# Patient Record
Sex: Male | Born: 1935
Health system: Southern US, Community
[De-identification: ages and names within clinical notes are randomized; demographics above are authoritative.]

## PROBLEM LIST (undated history)

## (undated) DIAGNOSIS — M069 Rheumatoid arthritis, unspecified: Secondary | ICD-10-CM

## (undated) DIAGNOSIS — E785 Hyperlipidemia, unspecified: Secondary | ICD-10-CM

## (undated) DIAGNOSIS — M1990 Unspecified osteoarthritis, unspecified site: Secondary | ICD-10-CM

## (undated) DIAGNOSIS — Z9289 Personal history of other medical treatment: Secondary | ICD-10-CM

## (undated) DIAGNOSIS — I10 Essential (primary) hypertension: Secondary | ICD-10-CM

## (undated) DIAGNOSIS — G4730 Sleep apnea, unspecified: Secondary | ICD-10-CM

## (undated) DIAGNOSIS — K219 Gastro-esophageal reflux disease without esophagitis: Secondary | ICD-10-CM

## (undated) DIAGNOSIS — F329 Major depressive disorder, single episode, unspecified: Secondary | ICD-10-CM

## (undated) DIAGNOSIS — G4733 Obstructive sleep apnea (adult) (pediatric): Secondary | ICD-10-CM

## (undated) DIAGNOSIS — N312 Flaccid neuropathic bladder, not elsewhere classified: Secondary | ICD-10-CM

## (undated) DIAGNOSIS — J449 Chronic obstructive pulmonary disease, unspecified: Secondary | ICD-10-CM

## (undated) HISTORY — DX: Rheumatoid arthritis, unspecified: M06.9

## (undated) HISTORY — PX: NISSEN FUNDOPLICATION: SHX2091

## (undated) HISTORY — DX: Chronic obstructive pulmonary disease, unspecified: J44.9

## (undated) HISTORY — DX: Unspecified osteoarthritis, unspecified site: M19.90

## (undated) HISTORY — DX: Obstructive sleep apnea (adult) (pediatric): G47.33

## (undated) HISTORY — DX: Gastro-esophageal reflux disease without esophagitis: K21.9

## (undated) HISTORY — DX: Major depressive disorder, single episode, unspecified: F32.9

## (undated) HISTORY — DX: Essential (primary) hypertension: I10

## (undated) HISTORY — DX: Flaccid neuropathic bladder, not elsewhere classified: N31.2

## (undated) HISTORY — DX: Hyperlipidemia, unspecified: E78.5

---
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
## 1967-01-22 HISTORY — PX: APPENDECTOMY: SHX54

## 1970-01-21 HISTORY — PX: VASECTOMY: SHX75

## 1988-01-22 HISTORY — PX: HERNIA REPAIR: SHX51

## 1997-09-08 ENCOUNTER — Other Ambulatory Visit: Admission: RE | Admit: 1997-09-08 | Discharge: 1997-09-08 | Payer: Self-pay | Admitting: Gastroenterology

## 1998-12-23 ENCOUNTER — Encounter (INDEPENDENT_AMBULATORY_CARE_PROVIDER_SITE_OTHER): Payer: Self-pay | Admitting: *Deleted

## 1998-12-23 ENCOUNTER — Inpatient Hospital Stay (HOSPITAL_COMMUNITY): Admission: EM | Admit: 1998-12-23 | Discharge: 1998-12-27 | Payer: Self-pay | Admitting: *Deleted

## 1998-12-27 ENCOUNTER — Encounter: Payer: Self-pay | Admitting: Gastroenterology

## 2001-02-27 ENCOUNTER — Ambulatory Visit (HOSPITAL_BASED_OUTPATIENT_CLINIC_OR_DEPARTMENT_OTHER): Admission: RE | Admit: 2001-02-27 | Discharge: 2001-02-27 | Payer: Self-pay | Admitting: Orthopedic Surgery

## 2001-11-20 ENCOUNTER — Encounter: Payer: Self-pay | Admitting: Urology

## 2001-11-20 ENCOUNTER — Encounter: Admission: RE | Admit: 2001-11-20 | Discharge: 2001-11-20 | Payer: Self-pay | Admitting: Urology

## 2001-12-15 ENCOUNTER — Encounter: Payer: Self-pay | Admitting: Gastroenterology

## 2001-12-15 ENCOUNTER — Encounter: Admission: RE | Admit: 2001-12-15 | Discharge: 2001-12-15 | Payer: Self-pay | Admitting: Gastroenterology

## 2002-01-21 HISTORY — PX: SIGMOIDECTOMY: SHX176

## 2002-01-29 ENCOUNTER — Inpatient Hospital Stay (HOSPITAL_COMMUNITY): Admission: RE | Admit: 2002-01-29 | Discharge: 2002-02-06 | Payer: Self-pay | Admitting: General Surgery

## 2002-01-29 ENCOUNTER — Encounter (INDEPENDENT_AMBULATORY_CARE_PROVIDER_SITE_OTHER): Payer: Self-pay | Admitting: Specialist

## 2003-02-01 ENCOUNTER — Emergency Department (HOSPITAL_COMMUNITY): Admission: EM | Admit: 2003-02-01 | Discharge: 2003-02-02 | Payer: Self-pay | Admitting: Emergency Medicine

## 2005-10-29 ENCOUNTER — Ambulatory Visit: Payer: Self-pay | Admitting: Gastroenterology

## 2005-12-06 ENCOUNTER — Ambulatory Visit: Payer: Self-pay | Admitting: Gastroenterology

## 2005-12-06 ENCOUNTER — Encounter: Payer: Self-pay | Admitting: Gastroenterology

## 2006-07-06 ENCOUNTER — Emergency Department (HOSPITAL_COMMUNITY): Admission: EM | Admit: 2006-07-06 | Discharge: 2006-07-06 | Payer: Self-pay | Admitting: Emergency Medicine

## 2008-04-05 ENCOUNTER — Encounter: Admission: RE | Admit: 2008-04-05 | Discharge: 2008-04-05 | Payer: Self-pay | Admitting: Orthopedic Surgery

## 2008-04-07 ENCOUNTER — Ambulatory Visit (HOSPITAL_BASED_OUTPATIENT_CLINIC_OR_DEPARTMENT_OTHER): Admission: RE | Admit: 2008-04-07 | Discharge: 2008-04-08 | Payer: Self-pay | Admitting: Orthopedic Surgery

## 2008-04-07 ENCOUNTER — Encounter (INDEPENDENT_AMBULATORY_CARE_PROVIDER_SITE_OTHER): Payer: Self-pay | Admitting: Orthopedic Surgery

## 2009-01-11 ENCOUNTER — Ambulatory Visit: Payer: Self-pay | Admitting: Pulmonary Disease

## 2009-01-11 DIAGNOSIS — M1990 Unspecified osteoarthritis, unspecified site: Secondary | ICD-10-CM | POA: Insufficient documentation

## 2009-01-11 DIAGNOSIS — G4733 Obstructive sleep apnea (adult) (pediatric): Secondary | ICD-10-CM | POA: Insufficient documentation

## 2009-01-11 DIAGNOSIS — E785 Hyperlipidemia, unspecified: Secondary | ICD-10-CM | POA: Insufficient documentation

## 2009-01-11 DIAGNOSIS — E119 Type 2 diabetes mellitus without complications: Secondary | ICD-10-CM | POA: Insufficient documentation

## 2009-01-11 DIAGNOSIS — M069 Rheumatoid arthritis, unspecified: Secondary | ICD-10-CM | POA: Insufficient documentation

## 2009-02-14 ENCOUNTER — Encounter: Payer: Self-pay | Admitting: Pulmonary Disease

## 2009-02-14 ENCOUNTER — Ambulatory Visit (HOSPITAL_BASED_OUTPATIENT_CLINIC_OR_DEPARTMENT_OTHER): Admission: RE | Admit: 2009-02-14 | Discharge: 2009-02-14 | Payer: Self-pay | Admitting: Pulmonary Disease

## 2009-03-01 ENCOUNTER — Ambulatory Visit: Payer: Self-pay | Admitting: Pulmonary Disease

## 2009-03-01 ENCOUNTER — Telehealth (INDEPENDENT_AMBULATORY_CARE_PROVIDER_SITE_OTHER): Payer: Self-pay | Admitting: *Deleted

## 2009-03-02 ENCOUNTER — Ambulatory Visit: Payer: Self-pay | Admitting: Pulmonary Disease

## 2009-03-08 ENCOUNTER — Telehealth (INDEPENDENT_AMBULATORY_CARE_PROVIDER_SITE_OTHER): Payer: Self-pay | Admitting: *Deleted

## 2009-04-20 ENCOUNTER — Ambulatory Visit: Payer: Self-pay | Admitting: Pulmonary Disease

## 2009-04-24 ENCOUNTER — Encounter: Payer: Self-pay | Admitting: Pulmonary Disease

## 2009-05-18 ENCOUNTER — Encounter: Payer: Self-pay | Admitting: Pulmonary Disease

## 2009-05-31 ENCOUNTER — Ambulatory Visit: Payer: Self-pay | Admitting: Pulmonary Disease

## 2009-06-13 ENCOUNTER — Telehealth (INDEPENDENT_AMBULATORY_CARE_PROVIDER_SITE_OTHER): Payer: Self-pay | Admitting: *Deleted

## 2009-06-21 ENCOUNTER — Encounter: Payer: Self-pay | Admitting: Pulmonary Disease

## 2009-07-26 ENCOUNTER — Ambulatory Visit: Payer: Self-pay | Admitting: Pulmonary Disease

## 2009-11-27 ENCOUNTER — Ambulatory Visit: Payer: Self-pay | Admitting: Pulmonary Disease

## 2010-02-20 NOTE — Progress Notes (Signed)
Summary: c pap provider  Phone Note Call from Patient   Caller: Patient Call For: clance Summary of Call: pt need another provider for c pap machine Initial call taken by: Rickard Patience,  March 08, 2009 2:03 PM  Follow-up for Phone Call        spoke to [email or web address]  nd pt's ins is pending and he will be called an notified of this  Follow-up by: Oneita Jolly,  March 08, 2009 2:34 PM

## 2010-02-20 NOTE — Letter (Signed)
Summary: CMN  CMN   Imported By: Lehman Prom 05/18/2009 16:57:48  _____________________________________________________________________  External Attachment:    Type:   Image     Comment:   External Document

## 2010-02-20 NOTE — Letter (Signed)
Summary: External Correspondence  External Correspondence   Imported By: Valinda Hoar 04/24/2009 15:41:25  _____________________________________________________________________  External Attachment:    Type:   Image     Comment:   External Document

## 2010-02-20 NOTE — Assessment & Plan Note (Signed)
Summary: ov to discuss sleep study results/mg   Copy to:  Self- Referral Primary Provider/Referring Provider:  Nolon Nations  CC:  Pt is here for a f/u appt to discuss sleep study results. .  History of Present Illness: the pt comes in today for f/u of his recent sleep study.  He was found to have severe osa, with AHI of 48/hr and desat as low as 82%.  He also had spontaneous desats requiring one liter of oxygen by sleep center protocol.  I have gone over the study in detail with him, and answered all of his questions.  Medications Prior to Update: 1)  Lipitor 40 Mg Tabs (Atorvastatin Calcium) .... Take 1 Tablet By Mouth Once A Day 2)  Paroxetine Hcl 20 Mg Tabs (Paroxetine Hcl) .... Take 1 Tablet By Mouth Once A Day 3)  Misoprostol 100 Mcg Tabs (Misoprostol) .... Take 1 Tablet By Mouth Two Times A Day 4)  Centrum  Tabs (Multiple Vitamins-Minerals) .... Take 1 Tablet By Mouth Once A Day 5)  Aspirin Low Dose 81 Mg Tabs (Aspirin) .... Take 1 Tablet By Mouth Once A Day 6)  Vitamin D 1000 Unit Tabs (Cholecalciferol) .... Take 1 Tablet By Mouth Two Times A Day 7)  Celebrex 200 Mg Caps (Celecoxib) .... Take 1 Tablet By Mouth Once A Day 8)  Metformin Hcl 750 Mg Xr24h-Tab (Metformin Hcl) .... Take 1 Tablet By Mouth Once A Day  Allergies (verified): No Known Drug Allergies  Vital Signs:  Patient profile:   77 year old male Height:      72 inches Weight:      267 pounds O2 Sat:      93 % on Room air Temp:     97.5 degrees F oral Pulse rate:   107 / minute BP sitting:   122 / 78  (left arm) Cuff size:   regular  Vitals Entered By: Arman Filter LPN (March 02, 2009 2:29 PM)  O2 Flow:  Room air CC: Pt is here for a f/u appt to discuss sleep study results.  Comments Medications reviewed with patient Arman Filter LPN  March 02, 2009 2:29 PM    Physical Exam  General:  obese male in nad   Impression & Recommendations:  Problem # 1:  OBSTRUCTIVE SLEEP APNEA (ICD-327.23) the  pt has severe obstructive sleep apnea by his sleep study, and will need to be started on cpap while working on weight loss.  Will start the pt on a moderate pressure level to allow desensitization, then will work on optimization with auto mode thereafter.  I have gone over the various types of masks with the patient, and have discussed the use of heated humidification.  He will followup with me in 4 weeks, and call if having tolerance issues.  Time spent with pt today was .  Other Orders: Est. Patient Level III (32992) DME Referral (DME)  Patient Instructions: 1)  will start on cpap 2)  work on weight loss 3)  followup with me in 4 weeks.

## 2010-02-20 NOTE — Assessment & Plan Note (Signed)
Summary: rov for osa   Visit Type:  Follow-up Primary Provider/Referring Provider:  Nolon Nations  CC:  6 month follow pt states he uses cpap eveyrnight x 10-11 hrs a night. Pt states he is having no problems today. Marland Kitchen  History of Present Illness: the pt comes in today for f/u of his known osa.  He is wearing cpap compliantly, and reports no issues with his cpap mask or pressure.  He feels that he is sleeping well, and is satisfied with his daytime alertness.  Current Medications (verified): 1)  Lipitor 40 Mg Tabs (Atorvastatin Calcium) .... Take 1 Tablet By Mouth Once A Day 2)  Paroxetine Hcl 20 Mg Tabs (Paroxetine Hcl) .... Take 1 Tablet By Mouth Once A Day 3)  Misoprostol 100 Mcg Tabs (Misoprostol) .... Take 1 Tablet By Mouth Two Times A Day 4)  Centrum  Tabs (Multiple Vitamins-Minerals) .... Take 1 Tablet By Mouth Once A Day 5)  Aspirin Low Dose 81 Mg Tabs (Aspirin) .... Take 1 Tablet By Mouth Once A Day 6)  Vitamin D 1000 Unit Tabs (Cholecalciferol) .... Take 2 Tabs By Mouth Two Times A Day 7)  Celebrex 200 Mg Caps (Celecoxib) .... Take 1 Tablet By Mouth Once A Day 8)  Metformin Hcl 750 Mg Xr24h-Tab (Metformin Hcl) .... Take 1 Tablet By Mouth Once A Day 9)  Cpap 11 .... At Bedtime  Allergies (verified): No Known Drug Allergies  Review of Systems       The patient complains of shortness of breath with activity, non-productive cough, and nasal congestion/difficulty breathing through nose.  The patient denies shortness of breath at rest, productive cough, coughing up blood, chest pain, irregular heartbeats, acid heartburn, indigestion, loss of appetite, weight change, abdominal pain, difficulty swallowing, sore throat, tooth/dental problems, headaches, sneezing, itching, ear ache, anxiety, depression, hand/feet swelling, joint stiffness or pain, rash, change in color of mucus, and fever.    Vital Signs:  Patient profile:   77 year old male Height:      72 inches Weight:      264.50  pounds BMI:     36.00 O2 Sat:      92 % on Room air Temp:     97.3 degrees F oral Pulse rate:   104 / minute BP sitting:   124 / 76  (left arm) Cuff size:   large  Vitals Entered By: Carver Fila (November 27, 2009 2:21 PM)  O2 Flow:  Room air CC: 6 month follow pt states he uses cpap eveyrnight x 10-11 hrs a night. Pt states he is having no problems today.  Comments meds and allergies updated Phone number updated Carver Fila  November 27, 2009 2:21 PM    Physical Exam  General:  obese male in nad Nose:  no skin breakdown or pressure necrosis from cpap mask Extremities:  no edema or cyanosis  Neurologic:  alert and oriented, moves all 4. does not appear sleepy.   Impression & Recommendations:  Problem # 1:  OBSTRUCTIVE SLEEP APNEA (ICD-327.23) the pt is doing well with cpap, and feels that he has adapted.  He denies any machine or mask issues, and feels his alertness during the day is satisfactory.  I have encouraged him to work on weight loss, and will see him back in one year.  I have also asked him to keep up with the mask changes and supplies for his machine.  Other Orders: Est. Patient Level III (16109)  Patient Instructions: 1)  followup with me in one year. 2)  work on losing weight    Immunization History:  Influenza Immunization History:    Influenza:  historical (11/22/2009)

## 2010-02-20 NOTE — Progress Notes (Signed)
----   Converted from flag ---- ---- 03/01/2009 12:20 PM, Arman Filter LPN wrote: pt needs ov with kc to discuss sleep study results. ------------------------------  called and spoke with pt.  pt scheduled to see kc tomorrow 03-02-09 at 2:30pm

## 2010-02-20 NOTE — Assessment & Plan Note (Signed)
Summary: rov for osa   Copy to:  Self- Referral Primary Provider/Referring Provider:  Nolon Nations  CC:  Pt is here for a f/u appt to discuss recent auto download.  Pt states he is wearing his cpap machine every night.  Approx 10 hours per night.  Pt states he dislikes current mask and would like a different one.  Marland Kitchen  History of Present Illness: The pt comes in today for f/u of his osa.  He has been on auto setting for 2 weeks, and has excellent compliance as documented by download.  His optimal pressure is 11cm.  His only complaint is issue with mask fitting, and from his description, sounds like it is too small.  He feels that it is helping his sleep and daytime alertness.  Current Medications (verified): 1)  Lipitor 40 Mg Tabs (Atorvastatin Calcium) .... Take 1 Tablet By Mouth Once A Day 2)  Paroxetine Hcl 20 Mg Tabs (Paroxetine Hcl) .... Take 1 Tablet By Mouth Once A Day 3)  Misoprostol 100 Mcg Tabs (Misoprostol) .... Take 1 Tablet By Mouth Two Times A Day 4)  Centrum  Tabs (Multiple Vitamins-Minerals) .... Take 1 Tablet By Mouth Once A Day 5)  Aspirin Low Dose 81 Mg Tabs (Aspirin) .... Take 1 Tablet By Mouth Once A Day 6)  Vitamin D 1000 Unit Tabs (Cholecalciferol) .... Take 2 Tabs By Mouth Two Times A Day 7)  Celebrex 200 Mg Caps (Celecoxib) .... Take 1 Tablet By Mouth Once A Day 8)  Metformin Hcl 750 Mg Xr24h-Tab (Metformin Hcl) .... Take 1 Tablet By Mouth Once A Day 9)  Cpap 5 .... At Bedtime  Allergies (verified): No Known Drug Allergies  Review of Systems      See HPI  Vital Signs:  Patient profile:   77 year old male Height:      72 inches Weight:      266 pounds BMI:     36.21 O2 Sat:      97 % on Room air Temp:     97.9 degrees F oral Pulse rate:   116 / minute BP sitting:   126 / 72  (left arm) Cuff size:   large  Vitals Entered By: Arman Filter LPN (May 31, 2009 2:36 PM)  O2 Flow:  Room air CC: Pt is here for a f/u appt to discuss recent auto download.  Pt  states he is wearing his cpap machine every night.  Approx 10 hours per night.  Pt states he dislikes current mask and would like a different one.   Comments Medications reviewed with patient Arman Filter LPN  May 31, 2009 2:36 PM    Physical Exam  General:  obese male in nad Nose:  no skin breakdown or pressure necrosis from cpap mask Neurologic:  alert and not sleepy, moves all 4.   Impression & Recommendations:  Problem # 1:  OBSTRUCTIVE SLEEP APNEA (ICD-327.23) the pt is adapting to his cpap except for his mask fit.  This needs to be worked on.  He has been optimized to a pressure of 11cm, and I have asked him to work aggressively on weight loss.  He is to let me know if he continues to have mask fit issues.  Medications Added to Medication List This Visit: 1)  Vitamin D 1000 Unit Tabs (Cholecalciferol) .... Take 2 tabs by mouth two times a day  Other Orders: Est. Patient Level III (47829) DME Referral (DME)  Patient Instructions: 1)  will  get your mask fitted again 2)  will have your heated humidifier checked 3)  will increase cpap pressure to 11. 4)  work on weight loss 5)  followup with me in 6mos if doing well.  If not, call me.   Immunization History:  Influenza Immunization History:    Influenza:  historical (01/22/2008)  Pneumovax Immunization History:    Pneumovax:  historical (01/22/2007)

## 2010-02-20 NOTE — Assessment & Plan Note (Signed)
Summary: rov for osa   Copy to:  Self- Referral Primary Provider/Referring Provider:  Nolon Nations  CC:  6 wk followup.  Pt states that he has been using CPAP x 1 month now.  Pt states that he has been averaging about 10-11 hrs of sleep at bedtime.  Pt denies any complaints regarding CPAP machine or mask.  He states that he is unsure if he feels better rested.Marland Kitchen  History of Present Illness: The pt comes in today for f/u of his osa.  He has been started on cpap, and has been compliant with therapy.  He reports no issues with mask fit or pressure, and feels that he does sleep better with the device.  He has not seen a significant difference in daytime alertness.  Current Medications (verified): 1)  Lipitor 40 Mg Tabs (Atorvastatin Calcium) .... Take 1 Tablet By Mouth Once A Day 2)  Paroxetine Hcl 20 Mg Tabs (Paroxetine Hcl) .... Take 1 Tablet By Mouth Once A Day 3)  Misoprostol 100 Mcg Tabs (Misoprostol) .... Take 1 Tablet By Mouth Two Times A Day 4)  Centrum  Tabs (Multiple Vitamins-Minerals) .... Take 1 Tablet By Mouth Once A Day 5)  Aspirin Low Dose 81 Mg Tabs (Aspirin) .... Take 1 Tablet By Mouth Once A Day 6)  Vitamin D 1000 Unit Tabs (Cholecalciferol) .... Take 1 Tablet By Mouth Two Times A Day 7)  Celebrex 200 Mg Caps (Celecoxib) .... Take 1 Tablet By Mouth Once A Day 8)  Metformin Hcl 750 Mg Xr24h-Tab (Metformin Hcl) .... Take 1 Tablet By Mouth Once A Day 9)  Cpap 5 .... At Bedtime  Allergies (verified): No Known Drug Allergies  Review of Systems      See HPI  Vital Signs:  Patient profile:   77 year old male Weight:      268.25 pounds O2 Sat:      92 % on Room air Temp:     97.5 degrees F oral Pulse rate:   113 / minute BP sitting:   118 / 80  (left arm)  Vitals Entered By: Vernie Murders (April 20, 2009 2:02 PM)  O2 Flow:  Room air  Physical Exam  General:  obese male in nad   Impression & Recommendations:  Problem # 1:  OBSTRUCTIVE SLEEP APNEA  (ICD-327.23)  the pt has adapted well to cpap, and now will require optimization of his pressure.  I have explained that he should see improvement in his daytime symptoms once he has been on an optimal pressure for a period of time.  Will turn his machine to the auto mode for the next 2 weeks, and establish his optimal pressure off the download.  I have also encouraged him to work aggressively on weight loss.  Medications Added to Medication List This Visit: 1)  Cpap 5  .... At bedtime  Other Orders: Est. Patient Level II (91478) DME Referral (DME)  Patient Instructions: 1)  will optimize pressure on automatic mode for the next 2 weeks.  I will call you with the results. 2)  work on weight loss 3)  will see you back in 6mos, but call if having issues with cpap.

## 2010-02-20 NOTE — Progress Notes (Signed)
Summary: cpap out  Phone Note Call from Patient Call back at [facility] Phone [phone]   Caller: Patient Call For: Clance Reason for Call: Talk to Nurse Summary of Call: cpap machine not working correctly - effective immediatly, pt is going to stop using because of severe aggrivation to pt's left eye.  14 days ago as result of visit you were to contact [employer].  As of 4:00 pm today 06/13/2009, pt has not heard from them.  Pt request cancellation of service from this company and set up with another DME.  [employer] has been rotten!!  Pt feels his health is in jeopardy, please help.  Showing you ordered on 05/31/2009 and [facility] sent order to [facility] on the same date. Initial call taken by: Eugene Gavia,  Jun 13, 2009 4:01 PM  Follow-up for Phone Call        Almyra Free or Bjorn Loser, please help with switching this pt's DME co b/c he is unhappy he still has not heard from [facility] yet. Follow-up by: Vernie Murders,  Jun 13, 2009 4:12 PM  Additional Follow-up for Phone Call Additional follow up Details #1::        spoke to pt and advised he would be switched from [facility] to [facility] for his cpap equiptment  Additional Follow-up by: Oneita Jolly,  Jun 13, 2009 4:31 PM

## 2010-02-20 NOTE — Assessment & Plan Note (Signed)
Summary: rov for osa   Copy to:  Self- Referral Primary Provider/Referring Provider:  Nolon Nations  CC:  Pt states that he got a new CPAP approx 1 month ago.  He states that it is working fine and has no problems.  He states that he was advised by [facility] to call and sched appt here for followup.  Marland Kitchen  History of Present Illness: the pt comes in today for f/u of his osa...he changed dme's and hence has to have a 4 week f/u with me.  He is doing very well with cpap, and has no complaints.  Current Medications (verified): 1)  Lipitor 40 Mg Tabs (Atorvastatin Calcium) .... Take 1 Tablet By Mouth Once A Day 2)  Paroxetine Hcl 20 Mg Tabs (Paroxetine Hcl) .... Take 1 Tablet By Mouth Once A Day 3)  Misoprostol 100 Mcg Tabs (Misoprostol) .... Take 1 Tablet By Mouth Two Times A Day 4)  Centrum  Tabs (Multiple Vitamins-Minerals) .... Take 1 Tablet By Mouth Once A Day 5)  Aspirin Low Dose 81 Mg Tabs (Aspirin) .... Take 1 Tablet By Mouth Once A Day 6)  Vitamin D 1000 Unit Tabs (Cholecalciferol) .... Take 2 Tabs By Mouth Two Times A Day 7)  Celebrex 200 Mg Caps (Celecoxib) .... Take 1 Tablet By Mouth Once A Day 8)  Metformin Hcl 750 Mg Xr24h-Tab (Metformin Hcl) .... Take 1 Tablet By Mouth Once A Day 9)  Cpap 11 .... At Bedtime  Allergies (verified): No Known Drug Allergies  Review of Systems  The patient denies shortness of breath with activity, shortness of breath at rest, productive cough, non-productive cough, coughing up blood, chest pain, irregular heartbeats, acid heartburn, indigestion, loss of appetite, weight change, abdominal pain, difficulty swallowing, sore throat, tooth/dental problems, headaches, nasal congestion/difficulty breathing through nose, sneezing, itching, ear ache, anxiety, depression, hand/feet swelling, joint stiffness or pain, rash, change in color of mucus, and fever.    Vital Signs:  Patient profile:   77 year old male Weight:      263 pounds O2 Sat:      92 % on Room  air Temp:     97.9 degrees F oral Pulse rate:   106 / minute BP sitting:   120 / 72  (left arm)  Vitals Entered By: Vernie Murders (July 26, 2009 1:31 PM)  O2 Flow:  Room air  Physical Exam  General:  ow male in nad Nose:  no skin breakdown or pressure necrosis from cpap mask   Impression & Recommendations:  Problem # 1:  OBSTRUCTIVE SLEEP APNEA (ICD-327.23)  the pt is wearing cpap compliantly, and feels it is working well for him.  He denies any mask or pressure issues.  I have asked him to continue with cpap, and have encouraged him to work on weight loss.  Orders: No Charge Patient Arrived (NCPA0) (NCPA0)  Medications Added to Medication List This Visit: 1)  Cpap 11  .... At bedtime  Patient Instructions: 1)  continue on cpap, and work on weight loss 2)  followup with me in 5mos

## 2010-02-20 NOTE — Letter (Signed)
Summary: CMN/HCS  CMN/HCS   Imported By: Lester Darke 06/27/2009 11:56:10  _____________________________________________________________________  External Attachment:    Type:   Image     Comment:   External Document

## 2010-05-03 LAB — COMPREHENSIVE METABOLIC PANEL WITH GFR
ALT: 31 U/L (ref 0–53)
AST: 30 U/L (ref 0–37)
Albumin: 3.5 g/dL (ref 3.5–5.2)
Alkaline Phosphatase: 76 U/L (ref 39–117)
BUN: 14 mg/dL (ref 6–23)
CO2: 28 meq/L (ref 19–32)
Calcium: 10 mg/dL (ref 8.4–10.5)
Chloride: 105 meq/L (ref 96–112)
Creatinine, Ser: 0.96 mg/dL (ref 0.4–1.5)
GFR calc non Af Amer: >60 mL/min
Glucose, Bld: 97 mg/dL (ref 70–99)
Potassium: 4.9 meq/L (ref 3.5–5.1)
Sodium: 139 meq/L (ref 135–145)
Total Bilirubin: 0.9 mg/dL (ref 0.3–1.2)
Total Protein: 6.4 g/dL (ref 6.0–8.3)

## 2010-05-03 LAB — GLUCOSE, CAPILLARY
Glucose-Capillary: 87 mg/dL (ref 70–99)
Glucose-Capillary: 96 mg/dL (ref 70–99)

## 2010-05-03 LAB — HEMOGLOBIN A1C
Hgb A1c MFr Bld: 6.8 % — ABNORMAL HIGH (ref 4.6–6.1)
Mean Plasma Glucose: 148 mg/dL

## 2010-05-03 LAB — LIPID PANEL: Cholesterol: 141 mg/dL (ref 0–200)

## 2010-06-05 NOTE — Op Note (Signed)
NAME:  Lance Powell, Lance Powell NO.:  [id number]   MEDICAL RECORD NO.:  [id number]          PATIENT TYPE:  AMB   LOCATION:  [facility]                          FACILITY:  [facility]   PHYSICIAN:  Loreta Ave, M.D. DATE OF BIRTH:  1930/01/17   DATE OF PROCEDURE:  04/07/2008  DATE OF DISCHARGE:                               OPERATIVE REPORT   PREOPERATIVE DIAGNOSIS:  Chronic draining septic olecranon bursitis,  left elbow.   POSTOPERATIVE DIAGNOSIS:  Chronic draining septic olecranon bursitis,  left elbow at this point without obvious purulence due to previous  treatment.   PROCEDURES:  Complete excision of olecranon bursa and draining sinus  tract with primary closure.   SURGEON:  Loreta Ave, MD   ASSISTANT:  Genene Churn. Barry Dienes, PA   ANESTHESIA:  General.   ESTIMATED BLOOD LOSS:  Minimal.   TOURNIQUET TIME:  45 minutes.   SPECIMENS:  Excised bursa.   CULTURES:  None.   COMPLICATIONS:  None.   DRESSING:  Bulky soft compressive.   PROCEDURE:  The patient brought to the operating room, placed on the  operating table in supine position.  After adequate anesthesia had been  obtained, tourniquet applied, prepped and draped in the usual sterile  fashion.  Exsanguinated with elevation of Esmarch, tourniquet inflated  to 250 mmHg.  Longitudinal incision over the elbow.  Elliptical excision  of the sinus track, which was near to the point of the elbow.  I then  went subcutaneously surrounded and excised the bursa in its entirety  including the skin and sinus track.  All superficial fascia.  Neurovascular structures protected including the ulnar nerve on the  medial side.  Once this had been completely excised, the wound was  thoroughly irrigated.  All inflammatory debris removed.  We then trimmed  the skin, which was redundant because of dead space.  Irrigated once  again.  It was then approximated with 1 layer of Vicryl and  then staples.  Sterile compressive dressing  with a bulky well-padded  dressing and Ace wrap applied.  Tourniquet deflated and removed.  Anesthesia reversed.  Brought to the recovery room.  Tolerated the  surgery well.  No complications.      Loreta Ave, M.D.  Electronically Signed     DFM/MEDQ  D:  04/07/2008  T:  04/08/2008  Job:  [id number]

## 2010-06-08 NOTE — Op Note (Signed)
[place]  Patient:    LISTER, BRIZZI Visit Number: [id number] MRN: [id number]          Service Type: DSU Location: [facility] Attending Physician:  Colbert Ewing Dictated by:   Loreta Ave, M.D. Proc. Date: 02/27/01 Admit Date:  02/27/2001 Discharge Date: 02/27/2001                             Operative Report  PREOPERATIVE DIAGNOSIS:  Triggering A1 pulley, right hand long finger.  POSTOPERATIVE DIAGNOSIS:  Triggering A1 pulley, right hand long finger.  OPERATIVE PROCEDURE:  Release of A1 pulley, long finger right hand.  SURGEON:  Loreta Ave, M.D.  ASSISTANT:  Arlys John D. Petrarca, P.A.-C.  ANESTHESIA:  IV regional.  SPECIMENS:  None.  CULTURES:  None.  COMPLICATIONS:  None.  DRESSING:  Soft compressive with a bulky hand dressing.  DESCRIPTION OF PROCEDURE:  The patient is brought to the operating room, placed on the operating room table in supine position.  After adequate anesthesia had been obtained, prepped and draped in the usual sterile fashion. A transverse incision over the A1 pulley long finger.  Skin and subcutaneous tissue divided.  Neurovascular bundles identified and protected.  A1 pulley identified and released in its entirety from proximal to distal under direct visualization.  Very thickened and chronic swelling within the pulley with fluid within the tendon sheath and moderate amount of tenosynovitis. Tenosynovium was resected, fluid drained.  Some attrition on the tendons, but they were basically intact.  All triggering obliterated at completion of the procedure.  Wound irrigated.  Skin closed with nylon.  Margins of wound injection with Marcaine.  Sterile compressive dressing and bulky hand dressing applied.  Anesthesia reversed, brought to recovery room.  Tolerated surgery well with no complications. Dictated by:   Loreta Ave, M.D. Attending Physician:  Colbert Ewing DD:   02/27/01 TD:  03/01/01 Job: [id number]

## 2010-06-08 NOTE — Discharge Summary (Signed)
NAME:  Lance Powell, Lance Powell                       ACCOUNT NO.:  [id number]   MEDICAL RECORD NO.:  [id number]                   PATIENT TYPE:  INP   LOCATION:  0444                                 FACILITY:  [facility]   PHYSICIAN:  Timothy E. Earlene Plater, M.D.              DATE OF BIRTH:  [birth year]   DATE OF ADMISSION:  01/29/2002  DATE OF DISCHARGE:  02/06/2002                                 DISCHARGE SUMMARY   FINAL DIAGNOSIS:  Chronic diverticulitis with vesicocolic fistula.   SECONDARY DIAGNOSES:  1. Right inguinal hernia.  2. Chronic cholelithiasis.   HISTORY OF PRESENT ILLNESS:  Please see the enclosed information.  The  patient is known to have high cholesterol and arthritis.  He is  significantly overweight.  Because of ongoing urinary tract infections, he  has been seen and followed by Barbette Hair. Arlyce Dice, M.D., and Maretta Bees. Vonita Moss,  M.D., and colovesical fistula was diagnosed.  He was electively scheduled  for this surgery.  He is ready to proceed at this time.  He was prepared at  home.  He tolerated it well.   HOSPITAL COURSE:  He was seen and evaluated on the morning of surgery and  then was subsequently taken to the operating room where together [employer].  Vonita Moss, M.D., and I performed a cystoscopy, partial cystectomy, sigmoid  colectomy, and internal repair of right inguinal hernia.  The surgery went  well and was not otherwise complicated.   The patient made a surprisingly quick and good recovery.  He remained alert  and was without disorientation.  He followed orders.  The Foley catheter  remained in place and the urine was clear.  He follow instructions and was  up and about and ambulatory and was an excellent patient.  He had a few  bowel sounds.  Liquids were started p.o. which he tolerated well.  He was  hungry.  He advanced to a regular diet which he tolerated without  difficulty.  Bowel action returned.  He was ready for discharge on February 06, 2002.  The Foley  will remain in place.  The  staples were removed.  He was discharged with Percocet and other orders.  He  will be seen and followed closely in the office by myself and Maretta Bees.  Vonita Moss, M.D.  The final pathology report does show dome of the bladder  fistula tract and sigmoid colon resection diverticulitis, paracolonic  inflammation, and granulation tissue.                                               Timothy E. Earlene Plater, M.D.    TED/MEDQ  D:  02/15/2002  T:  02/15/2002  Job:  [id number]   cc:   Maretta Bees. Vonita Moss, M.D.  [address]  [place]  [place] [address]  Fax: [phone]   Barbette Hair. Arlyce Dice, M.D. [facility]  [address]  [place]  [place] [address]  Fax: [phone]

## 2010-06-08 NOTE — H&P (Signed)
[place]. [facility]  Patient:    Lance Powell                     MRN: [id number] Adm. Date:  [id number] Attending:  Judeth Powell Dictator:   Lance Powell, P.A. CC:         Lance Powell. Arlyce Powell, M.D. [facility]             Lance Powell. Lance Powell., M.D.             Lance Powell. Lance Powell, M.D.                         History and Physical  CHIEF COMPLAINT:  Black stools, weakness, and dizziness.  HISTORY:  Lance Powell is a very nice 77 year old white male, a primary patient of Lance Powell in [place], also known to Lance Powell.  At this time, he is admitted with a three-day history of painless black stools.  He says that he has been passing two to three black bowel movements per day.  On the day of admission, he developed associated weakness and dizziness at home and presented for evaluation. Again, he had no associated abdominal pain, nausea, or vomiting.  No chest pain or shortness of breath.  The patient has no prior history of GI disease.  He has been taking Celebrex 200 mg daily for approximately one year for arthritis.  He was een and evaluated in the emergency room and admitted per Lance Powell with an acute upper GI bleed.  He was orthostatic at the time of admission with melena on rectal exam and hemoglobin of 10.  He is status post Nissen fundoplication approximately 10  years ago.  CURRENT MEDICATIONS: 1. Celebrex 200 mg p.o. q.d. 2. Paxil 10 mg p.o. q.d. 3. Baby aspirin 1 p.o. q.d. 4. Septra DS 1 p.o. b.i.d. for a urinary tract infection. 5. Lipitor 10 mg p.o. q.d.  ALLERGIES:  No known drug allergies.  PAST MEDICAL HISTORY: 1. Pertinent for current urinary tract infection, history of BPH, and urinary    retention, as well as intermittent hematuria.  He is undergoing evaluation per    Lance Powell and actually has an appointment next week and I believe was to ave    a cystoscopy. 2. Status post Nissen fundoplication approximately 10  years ago. 3. Inguinal hernia repair x 3. 4. Status post appendectomy. 5. Hyperlipidemia. 6. Depression. 7. Rheumatoid and osteoarthritis.  SOCIAL HISTORY:  The patient is married, currently retired.  No tobacco and no ETOH.  He is generally fairly active.  FAMILY HISTORY:  Negative for GI disease.  He does have one daughter with breast cancer.  Mother deceased with a CVA.  REVIEW OF SYSTEMS:  GI:  Pertinent for occasional heartburn.  Otherwise, as outlined above.  Cardiovascular:  No complaints of chest pain or anginal symptoms. Pulmonary:  No cough, shortness of breath, or sputum production. Musculoskeletal: He does have chronic arthritic pain in his lumbar spine, right hand, and right wrist.  Review of systems is otherwise completely benign.  PHYSICAL EXAMINATION:  GENERAL:  The patient is a well-developed white male.  VITAL SIGNS:  Blood pressure on admission 103/70 with a pulse of 121 lying. Standing blood pressure 89/60 with a pulse of 114.  Temperature on admission 97.6, respirations 16, weight 243 pounds.  HEENT:  Atraumatic, normocephalic.  EOMI; PERRLA.  Sclerae anicteric.  NECK:  Supple without nodes.  No  JVD or bruits.  CARDIOVASCULAR:  Tachy.  Regular rhythm with S1 and S2.  No murmurs, rubs, or gallops.  PULMONARY:  Clear to A&P.  ABDOMEN:  Soft, nontender.  There is no palpable hepatosplenomegaly or masses.  Bowel sounds are active.  RECTAL:  Black stool, 4+ positive.  EXTREMITIES:  No clubbing, cyanosis, or edema.  Pulses are 2+ distally.  NEUROLOGIC:  The patient is alert and oriented x 3.  Exam in grossly nonfocal.  LABORATORY DATA:  WBC 12.4, hemoglobin 10, hematocrit 30, MCV 92.  BUN 35.  IMPRESSION: 50. A 77 year old white male with an acute upper GI bleed, probable gastric or    duodenal ulcers secondary to non-steroidal anti-inflammatory drug use. 2. Anemia secondary to above. 3. Orthostasis secondary to above. 4. History of rheumatoid  and osteoarthritis. 5. Current urinary tract infection and benign prostatic hypertrophy and recent    hematuria. 6. Status post remote Nissen fundoplication. 7. Hyperlipidemia. 8. Status post inguinal hernia repair x 3 and appendectomy. 9. History of depression.  PLAN:  The patient is admitted to the service of Dr. Melvia Powell for IV fluid  hydration, serial H&Hs, transfusions as indicated, and will be covered with IV Pepcid and then PPI once taking p.o.  I will hold his Celebrex and baby aspirin and schedule upper endoscopy on the day of admission.  For further details, please ee the orders. DD:  12/24/98 TD:  12/25/98 Job: [id number]

## 2010-06-08 NOTE — Op Note (Signed)
NAME:  Lance Powell, Lance Powell                       ACCOUNT NO.:  [id number]   MEDICAL RECORD NO.:  [id number]                   PATIENT TYPE:  INP   LOCATION:  0002                                 FACILITY:  [facility]   PHYSICIAN:  Timothy E. Earlene Plater, M.D.              DATE OF BIRTH:  06-07-1929   DATE OF PROCEDURE:  01/29/2002  DATE OF DISCHARGE:                                 OPERATIVE REPORT   PREOPERATIVE DIAGNOSES:  Colovesical fistula.   POSTOPERATIVE DIAGNOSES:  Colovesical fistula, multiple adhesions, right  inguinal hernia and chronic diverticulitis with colovesical fistula.   PROCEDURE:  Exploratory laparotomy, lysis of adhesions, assist repair of  bladder colovesical fistula, sigmoid colectomy with low anterior  anastomosis.   SURGEON:  Timothy E. Earlene Plater, M.D.   ASSISTANT:  Anselm Pancoast. Zachery Dakins, M.D.   ANESTHESIA:  CRNA supervised M.D.   INDICATIONS FOR PROCEDURE:  Lance Powell was discovered to have recurrent  urinary tract infections unresponsive to oral antibiotics and was evaluated  and worked up by Larey Dresser and Barnet Pall and a colovesical fistula was  found in an area of intense chronic diverticulitis. After careful discussion  and planning, he was ready to proceed with the surgery. Dr. Vonita Moss is  available to assist with the urologic portion of the operation. The patient  was identified, permit signed, evaluated by anesthesia. He had been prepared  at home and was in stable condition on arrival this morning. His laboratory  data were acceptable.   DESCRIPTION OF PROCEDURE:  He was taken to the operating room, placed  supine, general endotracheal anesthesia administered. PAS hose, nasogastric  tube, and a full abdominal prep was accomplished. Dr. Vonita Moss then  performed flexible cystoscopy. The patient was placed in modified lithotomy  position and the legs were separately draped out. The abdomen was prepped  and draped in the usual fashion. A long midline  incision was used to enter  the abdominal cavity. Intense adhesions from the umbilicus superior were  found and in particular there was an old piece of mesh from a prior hernia  repair from the umbilicus and superior. The lower abdomen though very fatty  contained minimal adhesions. I cleared away part of the omental adhesions to  the mesh and the transverse colon was likewise imbedded in the mesh and this  was taken down sharply very carefully to avoid injury to the transverse  colon. This allowed for complete exposure to the pelvis and then we  proceeded with Dr. Sharon Seller Peterson's portion of the procedure; however, to  get there we had to divide the anterior surface of the rectosigmoid from the  posterior wall of the bladder. That was done sharply and bluntly and there  was no spillage. Please see Dr. Enos Fling note of separate procedure.   At the completion of Dr. Enos Fling partial cystectomy, the pelvis was dry,  the estimated blood loss had been about 200-250 and the patient  remained  stable. The sigmoid apex and distally to the rectal sigmoid was intensely  involved with diverticulosis and diverticulitis. The colon wall was  thickened, foreshortened; however, at the more proximal sigmoid and distal  descending colon the colon appeared more normal. By this point, we had  attempted to dissect into the upper abdomen and I was unable to do so. So  therefore although the patient has asymptomatic gallstones, we decided not  to pursue removal of the gallbladder. We could however reach the left colon  and it was long enough to reach into the pelvis. I transected the rectum at  its upper portion between clamps. I divided the mesentery carefully but  ureters had been identified and kept lateral. The mesentery in the sigmoid  was divided likewise and an area of the proximal sigmoid was then divided  between clamps and specimen passed off the field labeled and submitted to  pathology. The  proximal sigmoid easily reached into the pelvis and a hand  sewn end-to-end anastomosis was created to the upper end of the rectum. This  lay nicely over the brim of the pelvis, there was no tension at the  completion of this. It appeared to be air and water tight. The mesentery was  closed on the right side of the mesentery and the thrombus was copiously  irrigated. All sites were inspected. The sponge counts were correct. A 19 mm  JP drain was placed in the left pelvis and brought out through the right  lower abdominal wall. The patient was known to have a right inguinal hernia  and it was small. I dissected the peritoneum off the entrance to the hernia,  blindly grasped fascia and put two sutures of 2-0 Prolene in the fascia on  the inferior medial aspect of the hernia sac and I believe that was closed  satisfactorily. With all the counts correct, we closed the abdomen in a  single layer with #1 PDS suture and interrupted figure-of-eight #0 Prolene  sutures. The subcu was copiously irrigated and the skin was closed with wide  skin staples. Final counts were correct. Estimated blood loss 4-500 cc, none  replaced. He with stable and removed to the recovery room in good condition.                                               Timothy E. Earlene Plater, M.D.    TED/MEDQ  D:  01/29/2002  T:  01/29/2002  Job:  [id number]   cc:   Barbette Hair. Arlyce Dice, M.D. [facility]  [address]  [place]  [place] [address]  Fax: 1   Maretta Bees. Vonita Moss, M.D.  [address]  [place]  [place] [address]  Fax: [phone]

## 2010-06-08 NOTE — Op Note (Signed)
NAME:  WORTH, KOBER                       ACCOUNT NO.:  [id number]   MEDICAL RECORD NO.:  [id number]                   PATIENT TYPE:  INP   LOCATION:  0002                                 FACILITY:  [facility]   PHYSICIAN:  Maretta Bees. Vonita Moss, M.D.             DATE OF BIRTH:  October 22, 1929   DATE OF PROCEDURE:  01/29/2002  DATE OF DISCHARGE:                                 OPERATIVE REPORT   PREOPERATIVE DIAGNOSIS:  Colovesical fistula.   POSTOPERATIVE DIAGNOSIS:  Colovesical fistula.   PROCEDURE:  Cystoscopy and partial cystectomy.   SURGEON:  Maretta Bees. Vonita Moss, M.D.   ASSISTANT:  Sheppard Plumber. Earlene Plater, M.D.   ANESTHESIA:  General.   INDICATIONS FOR PROCEDURE:  This 77 year old white male had persistent  pyuria and was worked up with cystoscopy which did not reveal a fistula.  But because of the persistent pyuria which was new for him, despite having  no pneumouria or fecaluria, I ordered a CT scan and among other  abnormalities, it detected air in the bladder suspicious for colovesical  fistula.  He had bladder wall thickening and a diverticulum on the right  bladder wall.  He was worked up by [employer]. Arlyce Dice, M.D., who felt he had  severe diverticulosis of the colon, and he ended up seeing Timothy E. Earlene Plater,  M.D. for surgical consultation.  He was brought to the OR today for surgery  to correct this colovesical fistula.  After the descending colon was  identified by Dr. Earlene Plater, we dissected down toward the bladder.  The left  ureter was pulled medially due to some inflammatory tissue, and I dissected  out and explored the distal ureter with no evidence of involvement of  disease and retracted it laterally out of the way of our dissection.  The  colon was then taken down off the bladder, and the dome of the bladder had a  linear area of severely thickened and inflamed, indurated tissue.  I opened  up the bladder anteriorly and visualized the interior of the bladder and  palpated a  diverticulum on the right wall, as described on CT scan.  I could  not see a specific fistula inside the bladder, but I felt that it could have  been through one of several small diverticula and therefore not visualized  on the cystoscopy which I performed before and the cystoscopy that I  performed today in the operating room before his abdominal incision was  made.  All I saw today's cystoscopy were inflammatory findings on the  mucosa.  I then did a wedge resection of this area of involved thickened,  inflamed bladder wall and carried it down to normal tissue in the midline,  well away from both ureteral orifices.  I then closed the bladder with a  single layer closure of 0 chromic  catgut.  I then did a secondary embrocating closure with 0 chromic catgut on  the peritoneum  to protect the bladder wall closure.  The 22 French Foley  catheter inserted at the beginning of the case will be left indwelling for  postoperative drainage.  The rest of the surgery including his colon  resection will be dictated by Dr. Earlene Plater.                                               Maretta Bees. Vonita Moss, M.D.    LJP/MEDQ  D:  01/29/2002  T:  01/29/2002  Job:  [id number]   cc:   Sheppard Plumber. Earlene Plater, M.D.  [address] [place]  [place] [address]  Fax: [phone]   Barbette Hair. Arlyce Dice, M.D. [facility]  [address]  [place]  [place] [address]  Fax: 1   Nolon Nations, M.D.

## 2010-06-08 NOTE — Procedures (Signed)
[place]. [facility]  Patient:    Lance Powell                     MRN: [id number] Proc. Date: 12/23/98 Adm. Date:  [id number] Attending:  Judeth Cornfield CC:         Ulyess Mort, M.D. [facility]                           Procedure Report  PROCEDURE:  Upper endoscopy.  ENDOSCOPIST:  Barbette Hair. Arlyce Dice, M.D. [facility].  HISTORY:  The patient is a 77 year old gentleman admitted with a two day history of melena.  Today, he is noted to be weak and light-headed, and had a hemoglobin of 10 with heme positive stools.  He takes Celebrex on a regular basis.  INFORMED CONSENT:  The patient provided consent after risks, benefits and alternatives were explained.  MEDICATIONS:  Versed 6 and fentanyl 60, Robinul 0.2 mg IV.  DESCRIPTION OF PROCEDURE:  The patient was placed in the left lateral decubitus  position, and administered continuous low flow oxygen, and was placed on pulse oximetry.  The Olympus video gastroscope was inserted under direct vision into he oropharynx and esophagus after spraying the throat with Cetacaine spray.  FINDINGS: 1. In the duodenal bulb, there were multiple superficial erosions.  Biopsies were    taken to rule out H. pylori. 2. In the duodenal bulb, there was an active 1 cm ulcer with pigment at its base.    No fresh or old blood was seen. 3. Normal stomach, esophagus, and second and third portions of the duodenum.  IMPRESSION:  Active duodenal ulcer with superficial erosions.  RECOMMENDATIONS: 1. Hold Celebrex. 2. Prevacid 30 mg daily. DD:  12/23/98 TD:  12/26/98 Job: [id number]

## 2010-11-07 LAB — BASIC METABOLIC PANEL
CO2: 29
Calcium: 9.9
Chloride: 105
Creatinine, Ser: 0.95
GFR calc Af Amer: >60
Sodium: 139

## 2010-11-07 LAB — URINALYSIS, ROUTINE W REFLEX MICROSCOPIC
Glucose, UA: NEGATIVE
Hgb urine dipstick: NEGATIVE
Protein, ur: NEGATIVE
Specific Gravity, Urine: 1.02
Urobilinogen, UA: 0.2

## 2010-11-23 ENCOUNTER — Encounter: Payer: Self-pay | Admitting: Pulmonary Disease

## 2010-11-26 ENCOUNTER — Encounter: Payer: Self-pay | Admitting: Pulmonary Disease

## 2010-11-26 ENCOUNTER — Ambulatory Visit (INDEPENDENT_AMBULATORY_CARE_PROVIDER_SITE_OTHER): Payer: Medicare Other | Admitting: Pulmonary Disease

## 2010-11-26 VITALS — BP 122/70 | HR 99 | Temp 98.1°F | Ht 72.0 in | Wt 264.6 lb

## 2010-11-26 DIAGNOSIS — G4733 Obstructive sleep apnea (adult) (pediatric): Secondary | ICD-10-CM

## 2010-11-26 NOTE — Progress Notes (Signed)
  Subjective:    Patient ID: Lance Powell, male    DOB: 1929-10-20, 77 y.o.   MRN: [id number]  HPI Patient comes in today for followup of his known obstructive sleep apnea.  He is wearing CPAP compliantly, and feels that he is doing well with the device.  He denies any issues with mask fit or pressure, and feels that his sleep and daytime alertness are both adequate.  His weight is unchanged from the last visit.   Review of Systems  Constitutional: Negative for fever and unexpected weight change.  HENT: Negative for ear pain, nosebleeds, congestion, sore throat, rhinorrhea, sneezing, trouble swallowing, dental problem, postnasal drip and sinus pressure.   Eyes: Negative for redness and itching.  Respiratory: Positive for shortness of breath. Negative for apnea, cough, chest tightness and wheezing.   Cardiovascular: Negative for palpitations and leg swelling.  Gastrointestinal: Negative for nausea and vomiting.  Genitourinary: Negative for dysuria.  Musculoskeletal: Negative for joint swelling.  Skin: Negative for rash.  Neurological: Negative for headaches.  Hematological: Does not bruise/bleed easily.  Psychiatric/Behavioral: Negative for dysphoric mood. The patient is not nervous/anxious.        Objective:   Physical Exam Obese male in no acute distress No skin breakdown or pressure necrosis from the CPAP mask Lower extremities with mild edema, no cyanosis noted alert, does not appear to be sleepy, moves all 4 extremities.       Assessment & Plan:

## 2010-11-26 NOTE — Patient Instructions (Signed)
Continue on cpap, work on weight reduction followup with me in 12mos if doing well.

## 2010-11-26 NOTE — Assessment & Plan Note (Signed)
The patient is doing very well with CPAP, and is satisfied with his sleep and daytime alertness.  I've encouraged him to work more aggressively on weight reduction, and to keep up with his mask changes and supplies.  He is to followup with me again in one year if doing well.

## 2010-12-19 ENCOUNTER — Encounter: Payer: Self-pay | Admitting: Gastroenterology

## 2010-12-21 ENCOUNTER — Encounter (INDEPENDENT_AMBULATORY_CARE_PROVIDER_SITE_OTHER): Payer: Self-pay | Admitting: Surgery

## 2010-12-21 ENCOUNTER — Ambulatory Visit (INDEPENDENT_AMBULATORY_CARE_PROVIDER_SITE_OTHER): Payer: Medicare Other | Admitting: Surgery

## 2010-12-21 VITALS — BP 132/84 | HR 88 | Temp 97.2°F | Resp 16 | Ht 72.0 in | Wt 259.0 lb

## 2010-12-21 DIAGNOSIS — K602 Anal fissure, unspecified: Secondary | ICD-10-CM

## 2010-12-21 MED ORDER — AMBULATORY NON FORMULARY MEDICATION: 1.0000 "application " | Freq: Three times a day (TID) | Status: DC

## 2010-12-21 NOTE — Progress Notes (Signed)
  CC: Pain around the anus HPI: This man comes in for evaluation for some discomfort in the anal area. He says he's had some small lumps in the area but one of them has gone away. He has a small amount of bright red blood when he passes a bowel movement and has pain with passing bowel movements. He hasn't really tried any kind of therapy for this as yet. He thinks he may then seen here long time ago for similar problem.   ROS: His 12 point review of systems was negative except for some mild hearing loss  MEDS: [name] does not currently have medications on file.    ALLERGIES:No Known Allergies     PE General: The patient is alert awake and oriented and in no distress. Rectal: There is a tiny skin tag just to the left of the anterior midline. There is what appears to be a posterior anal fissure. There is no evidence of subcutaneous mass. Digital rectal exam shows no abnormality.   Assessment Anal fissure, posterior  Plan We will try him on some diltiazem cream and recheck him in about three weeks. At that time I think he would benefit from an anoscopic exam.

## 2010-12-21 NOTE — Patient Instructions (Signed)
Use the cream as prescribed and let me recheck this in about three weeks

## 2010-12-31 ENCOUNTER — Other Ambulatory Visit: Payer: Self-pay | Admitting: Urology

## 2011-01-01 ENCOUNTER — Encounter: Payer: Self-pay | Admitting: Pulmonary Disease

## 2011-01-01 ENCOUNTER — Ambulatory Visit (INDEPENDENT_AMBULATORY_CARE_PROVIDER_SITE_OTHER): Payer: Medicare Other | Admitting: Pulmonary Disease

## 2011-01-01 DIAGNOSIS — R0989 Other specified symptoms and signs involving the circulatory and respiratory systems: Secondary | ICD-10-CM

## 2011-01-01 DIAGNOSIS — G4733 Obstructive sleep apnea (adult) (pediatric): Secondary | ICD-10-CM

## 2011-01-01 DIAGNOSIS — R0600 Dyspnea, unspecified: Secondary | ICD-10-CM

## 2011-01-01 NOTE — Patient Instructions (Signed)
Will schedule for breathing studies, and see you back same day to review with you. Continue with cpap, work on weight reduction.

## 2011-01-01 NOTE — Assessment & Plan Note (Signed)
The pt has doe with moderate to heavier activities, and has mild am cough as well with nonpurulent mucus.  I suspect he does have an element of copd, but unsure without PFT's.  This will also help with assessment of his operative risk from a pulmonary standpoint.  Will hold off starting medications for now until we can see his pfts.  I have also encouraged him to work on weight loss and a conditioning program.

## 2011-01-01 NOTE — Assessment & Plan Note (Signed)
The pt is doing well with cpap, and download shows excellent compliance.

## 2011-01-01 NOTE — Progress Notes (Signed)
  Subjective:    Patient ID: Lance Powell, male    DOB: Jul 02, 1929, 77 y.o.   MRN: [id number]  HPI The pt is a 77y/o male who I have been asked to see by his primary md for evaluation of dyspnea.  The pt is having TURP next month, and requires preop pulmonary clearance.  He has a long h/o tobacco abuse, but has not smoked in years.  He describes a 5-6 block doe at moderate pace on flat ground, and denies sob with bringing groceries in from the car.  He is unsure about sob with stairs.  He has a chronic am cough with clear mucus.  He denies having any recent chest colds that require abx.  He has not had any recent pfts.  He has had a cxr, but is not available for my review.  The pt denies any h/o occupational exposures.    Review of Systems  Constitutional: Negative for fever and unexpected weight change.  HENT: Negative for ear pain, nosebleeds, congestion, sore throat, rhinorrhea, sneezing, trouble swallowing, dental problem, postnasal drip and sinus pressure.   Eyes: Negative for redness and itching.  Respiratory: Negative for cough, chest tightness, shortness of breath and wheezing.   Cardiovascular: Negative for palpitations and leg swelling.  Gastrointestinal: Negative for nausea and vomiting.  Genitourinary: Negative for dysuria.  Musculoskeletal: Negative for joint swelling.  Skin: Negative for rash.  Neurological: Negative for headaches.  Hematological: Does not bruise/bleed easily.  Psychiatric/Behavioral: Negative for dysphoric mood. The patient is not nervous/anxious.        Objective:   Physical Exam Constitutional:  Obese male, no acute distress  HENT:  Nares patent without discharge  Oropharynx without exudate, palate and uvula are elongated, large tongue  Eyes:  Perrla, eomi, no scleral icterus  Neck:  No JVD, no TMG  Cardiovascular:  Normal rate, regular rhythm, no rubs or gallops.  No murmurs        Intact distal pulses  Pulmonary :  Mildly decreased breath  sounds, no stridor or respiratory distress   No rales, rhonchi, or wheezing  Abdominal:  Soft, nondistended, bowel sounds present.  No tenderness noted.   Musculoskeletal:  No significant lower extremity edema noted.  Lymph Nodes:  No cervical lymphadenopathy noted  Skin:  No cyanosis noted  Neurologic:  Alert, appropriate, moves all 4 extremities without obvious deficit.         Assessment & Plan:

## 2011-01-01 NOTE — Progress Notes (Signed)
Addended by: Salli Quarry on: 01/01/2011 03:08 PM   Modules accepted: Orders

## 2011-01-11 ENCOUNTER — Ambulatory Visit (INDEPENDENT_AMBULATORY_CARE_PROVIDER_SITE_OTHER): Payer: Medicare Other | Admitting: Pulmonary Disease

## 2011-01-11 ENCOUNTER — Ambulatory Visit (HOSPITAL_COMMUNITY)
Admission: RE | Admit: 2011-01-11 | Discharge: 2011-01-11 | Disposition: A | Discharge status: Home / Self Care | Payer: Medicare Other | Source: Ambulatory Visit | Attending: Pulmonary Disease | Admitting: Pulmonary Disease

## 2011-01-11 ENCOUNTER — Encounter: Payer: Self-pay | Admitting: Pulmonary Disease

## 2011-01-11 ENCOUNTER — Encounter (INDEPENDENT_AMBULATORY_CARE_PROVIDER_SITE_OTHER): Payer: Medicare Other | Admitting: Surgery

## 2011-01-11 DIAGNOSIS — R0989 Other specified symptoms and signs involving the circulatory and respiratory systems: Secondary | ICD-10-CM | POA: Insufficient documentation

## 2011-01-11 DIAGNOSIS — R0609 Other forms of dyspnea: Secondary | ICD-10-CM | POA: Insufficient documentation

## 2011-01-11 DIAGNOSIS — R059 Cough, unspecified: Secondary | ICD-10-CM | POA: Insufficient documentation

## 2011-01-11 DIAGNOSIS — J449 Chronic obstructive pulmonary disease, unspecified: Secondary | ICD-10-CM

## 2011-01-11 DIAGNOSIS — R05 Cough: Secondary | ICD-10-CM | POA: Insufficient documentation

## 2011-01-11 DIAGNOSIS — J988 Other specified respiratory disorders: Secondary | ICD-10-CM | POA: Insufficient documentation

## 2011-01-11 DIAGNOSIS — R0600 Dyspnea, unspecified: Secondary | ICD-10-CM

## 2011-01-11 LAB — PULMONARY FUNCTION TEST

## 2011-01-11 MED ORDER — ALBUTEROL SULFATE HFA 108 (90 BASE) MCG/ACT IN AERS: 2.0000 | INHALATION_SPRAY | Freq: Four times a day (QID) | RESPIRATORY_TRACT | Status: DC | PRN

## 2011-01-11 NOTE — Progress Notes (Signed)
  Subjective:    Patient ID: Lance Powell, male    DOB: January 22, 1930, 77 y.o.   MRN: [id number]  HPI The patient comes in today for follow up after his recent pulmonary function studies.  He was found to have moderate airflow obstruction, and definite air-trapping.  His diffusion capacity was 46% of predicted, and therefore his obstruction is most consistent with emphysema.  I have reviewed the study with him in detail, and answered all of his questions.   Review of Systems  Constitutional: Negative for fever and unexpected weight change.  HENT: Negative for ear pain, nosebleeds, congestion, sore throat, rhinorrhea, sneezing, trouble swallowing, dental problem, postnasal drip and sinus pressure.   Eyes: Negative for redness and itching.  Respiratory: Positive for cough and shortness of breath. Negative for chest tightness and wheezing.   Cardiovascular: Negative for palpitations and leg swelling.  Gastrointestinal: Negative for nausea and vomiting.  Genitourinary: Negative for dysuria.  Musculoskeletal: Negative for joint swelling.  Skin: Negative for rash.  Neurological: Negative for headaches.  Hematological: Does not bruise/bleed easily.  Psychiatric/Behavioral: Negative for dysphoric mood. The patient is not nervous/anxious.        Objective:   Physical Exam Overweight male in no acute distress Nose without purulence or discharge noted Chest with mildly decreased breath sounds, no wheezes or rhonchi Heart exam with regular rate and rhythm Lower extremities with mild edema, no cyanosis Alert and oriented, moves all 4 extremities.       Assessment & Plan:

## 2011-01-11 NOTE — Assessment & Plan Note (Signed)
The patient has moderate COPD by his recent pulmonary function studies, and therefore will start him on a trial of maintenance bronchodilator therapy.  I've also encouraged him to work aggressively on weight loss and some type of conditioning program.  From a pulmonary standpoint, I think he is at low to moderate risk for peri and postoperative pulmonary complications associated with TURP.  Will send a note to his urologist clearing him from a pulmonary standpoint for the surgery.

## 2011-01-11 NOTE — Patient Instructions (Signed)
Will start on spiriva one inhalation each am.  Please let us know if you feel it is helping within 3 weeks, and we can send in prescription to [facility] Albuterol inhaler 2puffs up to every 6 hrs if needed for emergencies.  Will send in script to [facility] for this now. Work on [employer] loss Will send a note to urology clearing you for surgery. followup with me in 3 mos to check on your breathing.

## 2011-01-18 ENCOUNTER — Encounter (HOSPITAL_BASED_OUTPATIENT_CLINIC_OR_DEPARTMENT_OTHER): Payer: Self-pay | Admitting: *Deleted

## 2011-01-18 NOTE — Progress Notes (Addendum)
To [facility] at 0730- Istat,Ekg on arrival,Cxr with chart.npo after mn.Reviewed [facility] guidelines-instructed to bring cpap and mask and home meds.

## 2011-01-23 ENCOUNTER — Encounter (HOSPITAL_BASED_OUTPATIENT_CLINIC_OR_DEPARTMENT_OTHER): Payer: Self-pay | Admitting: Anesthesiology

## 2011-01-23 ENCOUNTER — Other Ambulatory Visit: Payer: Self-pay

## 2011-01-23 ENCOUNTER — Encounter (HOSPITAL_BASED_OUTPATIENT_CLINIC_OR_DEPARTMENT_OTHER): Payer: Self-pay

## 2011-01-23 ENCOUNTER — Encounter (HOSPITAL_BASED_OUTPATIENT_CLINIC_OR_DEPARTMENT_OTHER)
Admission: RE | Disposition: A | Discharge status: Home / Self Care | Payer: Self-pay | Source: Ambulatory Visit | Attending: Urology

## 2011-01-23 ENCOUNTER — Ambulatory Visit (HOSPITAL_BASED_OUTPATIENT_CLINIC_OR_DEPARTMENT_OTHER)
Admission: RE | Admit: 2011-01-23 | Discharge: 2011-01-24 | Disposition: A | Discharge status: Home / Self Care | Payer: Medicare Other | Source: Ambulatory Visit | Attending: Urology | Admitting: Urology

## 2011-01-23 ENCOUNTER — Other Ambulatory Visit: Payer: Self-pay | Admitting: Urology

## 2011-01-23 ENCOUNTER — Ambulatory Visit (HOSPITAL_BASED_OUTPATIENT_CLINIC_OR_DEPARTMENT_OTHER): Payer: Medicare Other | Admitting: Anesthesiology

## 2011-01-23 DIAGNOSIS — Z7982 Long term (current) use of aspirin: Secondary | ICD-10-CM | POA: Insufficient documentation

## 2011-01-23 DIAGNOSIS — E7800 Pure hypercholesterolemia, unspecified: Secondary | ICD-10-CM | POA: Insufficient documentation

## 2011-01-23 DIAGNOSIS — K219 Gastro-esophageal reflux disease without esophagitis: Secondary | ICD-10-CM | POA: Insufficient documentation

## 2011-01-23 DIAGNOSIS — N401 Enlarged prostate with lower urinary tract symptoms: Secondary | ICD-10-CM | POA: Insufficient documentation

## 2011-01-23 DIAGNOSIS — R339 Retention of urine, unspecified: Secondary | ICD-10-CM | POA: Insufficient documentation

## 2011-01-23 DIAGNOSIS — N400 Enlarged prostate without lower urinary tract symptoms: Secondary | ICD-10-CM | POA: Diagnosis present

## 2011-01-23 DIAGNOSIS — N320 Bladder-neck obstruction: Secondary | ICD-10-CM | POA: Diagnosis present

## 2011-01-23 DIAGNOSIS — E119 Type 2 diabetes mellitus without complications: Secondary | ICD-10-CM | POA: Insufficient documentation

## 2011-01-23 DIAGNOSIS — Z79899 Other long term (current) drug therapy: Secondary | ICD-10-CM | POA: Insufficient documentation

## 2011-01-23 DIAGNOSIS — N138 Other obstructive and reflux uropathy: Secondary | ICD-10-CM | POA: Insufficient documentation

## 2011-01-23 HISTORY — DX: Sleep apnea, unspecified: G47.30

## 2011-01-23 HISTORY — PX: TRANSURETHRAL RESECTION OF PROSTATE: SHX73

## 2011-01-23 HISTORY — PX: CYSTOSCOPY: SHX5120

## 2011-01-23 LAB — BASIC METABOLIC PANEL
BUN: 20 mg/dL (ref 6–23)
Chloride: 103 mEq/L (ref 96–112)
GFR calc Af Amer: 67 mL/min — ABNORMAL LOW (ref >90)
GFR calc non Af Amer: 58 mL/min — ABNORMAL LOW (ref >90)
Glucose, Bld: 108 mg/dL — ABNORMAL HIGH (ref 70–99)
Potassium: 4.3 mEq/L (ref 3.5–5.1)
Sodium: 140 mEq/L (ref 135–145)

## 2011-01-23 LAB — GLUCOSE, CAPILLARY: Glucose-Capillary: 105 mg/dL — ABNORMAL HIGH (ref 70–99)

## 2011-01-23 LAB — POCT I-STAT 4, (NA,K, GLUC, HGB,HCT)
HCT: 50 % (ref 39.0–52.0)
Hemoglobin: 17 g/dL (ref 13.0–17.0)

## 2011-01-23 SURGERY — TRANSURETHRAL RESECTION OF THE PROSTATE WITH GYRUS INSTRUMENTS
Anesthesia: General | Site: Prostate | Wound class: Clean Contaminated

## 2011-01-23 MED ORDER — CEFAZOLIN SODIUM-DEXTROSE 2-3 GM-% IV SOLR
2.0000 g | Freq: Three times a day (TID) | INTRAVENOUS | Status: AC
  Administered 2011-01-23 (×2): 2 g via INTRAVENOUS

## 2011-01-23 MED ORDER — METFORMIN HCL ER 750 MG PO TB24: 750.0000 mg | ORAL_TABLET | Freq: Every day | ORAL | Status: DC

## 2011-01-23 MED ORDER — 0.9 % SODIUM CHLORIDE (POUR BTL) OPTIME
TOPICAL | Status: DC | PRN
  Administered 2011-01-23: 500 mL

## 2011-01-23 MED ORDER — INDIGOTINDISULFONATE SODIUM 8 MG/ML IJ SOLN
INTRAMUSCULAR | Status: DC | PRN
  Administered 2011-01-23: 5 mL via INTRAVENOUS

## 2011-01-23 MED ORDER — FENTANYL CITRATE 0.05 MG/ML IJ SOLN
INTRAMUSCULAR | Status: DC | PRN
  Administered 2011-01-23 (×2): 25 ug via INTRAVENOUS
  Administered 2011-01-23: 50 ug via INTRAVENOUS
  Administered 2011-01-23 (×2): 25 ug via INTRAVENOUS

## 2011-01-23 MED ORDER — BACITRACIN-NEOMYCIN-POLYMYXIN 400-5-5000 EX OINT: 1.0000 "application " | TOPICAL_OINTMENT | Freq: Three times a day (TID) | CUTANEOUS | Status: DC | PRN

## 2011-01-23 MED ORDER — POLYETHYLENE GLYCOL 3350 17 G PO PACK
17.0000 g | PACK | Freq: Every day | ORAL | Status: DC
  Administered 2011-01-23: 17 g via ORAL

## 2011-01-23 MED ORDER — CEFAZOLIN SODIUM 1-5 GM-% IV SOLN
1.0000 g | INTRAVENOUS | Status: AC
  Administered 2011-01-23: 2 g via INTRAVENOUS

## 2011-01-23 MED ORDER — METOCLOPRAMIDE HCL 5 MG/ML IJ SOLN
INTRAMUSCULAR | Status: DC | PRN
  Administered 2011-01-23: 10 mg via INTRAVENOUS

## 2011-01-23 MED ORDER — OXYCODONE HCL 5 MG PO TABS: 5.0000 mg | ORAL_TABLET | ORAL | Status: DC | PRN

## 2011-01-23 MED ORDER — SIMVASTATIN 20 MG PO TABS: 20.0000 mg | ORAL_TABLET | Freq: Every day | ORAL | Status: DC

## 2011-01-23 MED ORDER — BELLADONNA ALKALOIDS-OPIUM 16.2-60 MG RE SUPP
RECTAL | Status: DC | PRN
  Administered 2011-01-23: 1 via RECTAL

## 2011-01-23 MED ORDER — SODIUM CHLORIDE 0.9 % IV SOLN
INTRAVENOUS | Status: DC
Start: 2011-01-23 — End: 2011-01-25
  Administered 2011-01-23: 15:00:00 via INTRAVENOUS

## 2011-01-23 MED ORDER — ALBUTEROL SULFATE HFA 108 (90 BASE) MCG/ACT IN AERS: 2.0000 | INHALATION_SPRAY | Freq: Four times a day (QID) | RESPIRATORY_TRACT | Status: DC | PRN

## 2011-01-23 MED ORDER — INSULIN ASPART 100 UNIT/ML ~~LOC~~ SOLN: 0.0000 [IU] | Freq: Three times a day (TID) | SUBCUTANEOUS | Status: DC

## 2011-01-23 MED ORDER — MORPHINE SULFATE 2 MG/ML IJ SOLN: 2.0000 mg | INTRAMUSCULAR | Status: DC | PRN

## 2011-01-23 MED ORDER — LIDOCAINE HCL 2 % EX GEL
CUTANEOUS | Status: DC | PRN
  Administered 2011-01-23: 1

## 2011-01-23 MED ORDER — ONDANSETRON HCL 4 MG/2ML IJ SOLN
INTRAMUSCULAR | Status: DC | PRN
  Administered 2011-01-23: 4 mg via INTRAVENOUS

## 2011-01-23 MED ORDER — LACTATED RINGERS IV SOLN
INTRAVENOUS | Status: DC
  Administered 2011-01-23 (×2): via INTRAVENOUS

## 2011-01-23 MED ORDER — ONDANSETRON HCL 4 MG/2ML IJ SOLN: 4.0000 mg | INTRAMUSCULAR | Status: DC | PRN

## 2011-01-23 MED ORDER — PAROXETINE HCL 20 MG PO TABS: 20.0000 mg | ORAL_TABLET | ORAL | Status: DC

## 2011-01-23 MED ORDER — ACETAMINOPHEN 325 MG PO TABS: 650.0000 mg | ORAL_TABLET | ORAL | Status: DC | PRN

## 2011-01-23 MED ORDER — BELLADONNA ALKALOIDS-OPIUM 16.2-60 MG RE SUPP: 1.0000 | Freq: Four times a day (QID) | RECTAL | Status: DC | PRN

## 2011-01-23 MED ORDER — ACETAMINOPHEN 10 MG/ML IV SOLN
1000.0000 mg | Freq: Four times a day (QID) | INTRAVENOUS | Status: AC
  Administered 2011-01-23 – 2011-01-24 (×4): 1000 mg via INTRAVENOUS

## 2011-01-23 MED ORDER — SENNOSIDES-DOCUSATE SODIUM 8.6-50 MG PO TABS
1.0000 | ORAL_TABLET | Freq: Two times a day (BID) | ORAL | Status: DC
  Administered 2011-01-23: 1 via ORAL

## 2011-01-23 MED ORDER — SODIUM CHLORIDE 0.9 % IR SOLN
Status: DC | PRN
  Administered 2011-01-23: 45000 mL

## 2011-01-23 MED ORDER — INSULIN ASPART 100 UNIT/ML ~~LOC~~ SOLN: 0.0000 [IU] | Freq: Every day | SUBCUTANEOUS | Status: DC

## 2011-01-23 MED ORDER — FENTANYL CITRATE 0.05 MG/ML IJ SOLN: 25.0000 ug | INTRAMUSCULAR | Status: DC | PRN

## 2011-01-23 MED ORDER — LIDOCAINE HCL (CARDIAC) 20 MG/ML IV SOLN
INTRAVENOUS | Status: DC | PRN
  Administered 2011-01-23: 60 mg via INTRAVENOUS

## 2011-01-23 MED ORDER — PROPOFOL 10 MG/ML IV EMUL
INTRAVENOUS | Status: DC | PRN
  Administered 2011-01-23: 100 mg via INTRAVENOUS

## 2011-01-23 MED ORDER — MISOPROSTOL 100 MCG PO TABS
100.0000 ug | ORAL_TABLET | Freq: Two times a day (BID) | ORAL | Status: DC
  Administered 2011-01-24: 100 ug via ORAL

## 2011-01-23 MED ORDER — STERILE WATER FOR IRRIGATION IR SOLN
Status: DC | PRN
  Administered 2011-01-23: 500 mL

## 2011-01-23 SURGICAL SUPPLY — 34 items
BAG DRAIN URO-CYSTO SKYTR STRL (DRAIN) ×3 IMPLANT
BAG URINE DRAINAGE (UROLOGICAL SUPPLIES) ×3 IMPLANT
BAG URINE LEG 19OZ MD ST LTX (BAG) IMPLANT
CANISTER SUCT LVC 12 LTR MEDI- (MISCELLANEOUS) ×12 IMPLANT
CATH FOLEY 3WAY 30CC 24FR (CATHETERS)
CATH HEMA 3WAY 30CC 24FR COUDE (CATHETERS) IMPLANT
CATH HEMA 3WAY 30CC 24FR RND (CATHETERS) ×3 IMPLANT
CATH URTH STD 24FR FL 3W 2 (CATHETERS) IMPLANT
CLOTH BEACON ORANGE TIMEOUT ST (SAFETY) ×3 IMPLANT
DRAPE CAMERA CLOSED 9X96 (DRAPES) ×3 IMPLANT
ELECT BUTTON HF 24-28F 2 30DE (ELECTRODE) IMPLANT
ELECT LOOP MED HF 24F 12D CBL (CLIP) ×3 IMPLANT
ELECT REM PT RETURN 9FT ADLT (ELECTROSURGICAL)
ELECT RESECT VAPORIZE 12D CBL (ELECTRODE) ×3 IMPLANT
ELECTRODE REM PT RTRN 9FT ADLT (ELECTROSURGICAL) IMPLANT
EVACUATOR MICROVAS BLADDER (UROLOGICAL SUPPLIES) ×3 IMPLANT
GAUZE SPONGE 4X4 12PLY STRL LF (GAUZE/BANDAGES/DRESSINGS) ×3 IMPLANT
GLOVE BIOGEL PI IND STRL 7.0 (GLOVE) ×2 IMPLANT
GLOVE BIOGEL PI INDICATOR 7.0 (GLOVE) ×1
GLOVE ECLIPSE 7.0 STRL STRAW (GLOVE) ×3 IMPLANT
GLOVE ECLIPSE 8.0 STRL XLNG CF (GLOVE) ×3 IMPLANT
GLOVE INDICATOR 6.5 STRL GRN (GLOVE) ×6 IMPLANT
GOWN PREVENTION PLUS XLARGE (GOWN DISPOSABLE) ×3 IMPLANT
GOWN STRL NON-REIN LRG LVL3 (GOWN DISPOSABLE) ×3 IMPLANT
HOLDER FOLEY CATH W/STRAP (MISCELLANEOUS) ×3 IMPLANT
IV NS IRRIG 3000ML ARTHROMATIC (IV SOLUTION) ×33 IMPLANT
KIT ASPIRATION TUBING (SET/KITS/TRAYS/PACK) ×3 IMPLANT
NS IRRIG 500ML POUR BTL (IV SOLUTION) ×3 IMPLANT
PACK CYSTOSCOPY (CUSTOM PROCEDURE TRAY) ×3 IMPLANT
PLUG CATH AND CAP STER (CATHETERS) IMPLANT
SYR 30ML LL (SYRINGE) ×3 IMPLANT
SYRINGE 10CC LL (SYRINGE) ×3 IMPLANT
SYRINGE IRR TOOMEY STRL 70CC (SYRINGE) ×3 IMPLANT
WATER STERILE IRR 500ML POUR (IV SOLUTION) ×3 IMPLANT

## 2011-01-23 NOTE — Transfer of Care (Signed)
Immediate Anesthesia Transfer of Care Note  Patient: Lance Powell  Procedure(s) Performed:  TRANSURETHRAL RESECTION OF THE PROSTATE WITH GYRUS INSTRUMENTS; CYSTOSCOPY  Patient Location: PACU  Anesthesia Type: General  Level of Consciousness: sedate, orients and arouses to name and command  Airway & Oxygen Therapy: Patient Spontanous Breathing and Patient connected to face mask oxygen  Post-op Assessment: Report given to PACU RN and Post -op Vital signs reviewed and stable  Post vital signs: Reviewed and stable  Complications: No apparent anesthesia complications

## 2011-01-23 NOTE — Op Note (Signed)
NAME:  Lance Powell, Lance Powell NO.:  [id number]  MEDICAL RECORD NO.:  [id number]  LOCATION:                               FACILITY:  [facility]  PHYSICIAN:  Natalia Leatherwood, MD    DATE OF BIRTH:  08/03/1929  DATE OF PROCEDURE:  01/23/2011 DATE OF DISCHARGE:                              OPERATIVE REPORT   SURGEON:  Natalia Leatherwood, MD.  ASSISTANT:  None.  PREOPERATIVE DIAGNOSES: 1. Urinary retention. 2. Benign prostatic hypertrophy. 3. Bladder neck contracture.  POSTOPERATIVE DIAGNOSES: 1. Urinary retention. 2. Benign prostatic hypertrophy. 3. Bladder neck contracture.  PROCEDURES PERFORMED: 1. Cystoscopy. 2. Transurethral resection of prostate. 3. Transurethral resection of bladder neck contracture.  ESTIMATED BLOOD LOSS:  25 cc.  SPECIMEN:  Prostate chips sent for permanent pathology.  COMPLICATIONS:  None.  DRAINS:  Foley catheter with continuous bladder irrigation.  FINDINGS:  Bladder neck contracture and enlarged prostate with a large amount of anterior tissue of the prostate.  HISTORY OF PRESENT ILLNESS:  This is a 78 year old male with a history of an enlarged prostate.  He had a TURP over 15 years ago.  The patient presented with urinary retention and after workup, was felt that his prostate was obstructing his urinary flow.  He was unable to be free of the catheter.  We discussed different intervention options.  The patient elected for transurethral resection of the prostate and transurethral resection of the bladder neck contracture.  He presented today for that procedure.  PROCEDURE:  After informed consent was obtained, the patient was taken to operating room, where he was placed in the supine position.  IV antibiotics were infused and general anesthesia was induced.  A time-out was performed in which the correct patient, surgical site, and procedure were all identified and agreed upon by the team.  He was then placed in a dorsal lithotomy  position, making sure to pad all pertinent neurovascular pressure points.  His genitals were then prepped and draped in usual sterile fashion.  Next, a visual obturator to the Gyrus resectoscope was placed through the urethra into the bladder.  He was noted to have a large amount of anterior tissue of his prostate that was obstructing.  He had a fixed bladder neck contracture that was able to be navigated with the cystoscope.  He had a large amount of cellules in his bladder.  His ureteral orifices were not easily identified. Therefore, he was given indigo carmine.  After this, these were easily identified and they were seen to be well away from the resection site. Following this, resection was carried out in normal saline with the loop and plasma button of the Gyrus resectoscope.  First, resection was carried down at the 6 o'clock position from the bladder neck down to the verumontanum.  Resection was then carried out on 6 o'clock position and then on the left lateral wall of the prostate, carrying it down to the capsule.  It was noted that he had a large amount of anterior tissue in his prostate.  This resection was carried out down to the capsule with the plasma button and the loop resection.  All resection was maintained proximal to the sphincter.  Resection  was then carried down on the right side of the prostate with a plasma button and loop resection down to the capsule as well, on this side.  He did have a large amount of anterior tissue on this side as well.  All resection was maintained proximal to the sphincter.  When visualizing the entire channel, it was noted that he had a large amount of prostate tissue also, at the 12 o'clock position on the roof that was obstructing and this was resected as well with the loop and the Gyrus making sure to stay proximal to the sphincter.  After this was done, all chips were removed from the prostate and all cellules were examined to ensure  there are no prostate chips remaining behind.  Hemostasis was maintained with fulguration of the Gyrus resectoscope.  After this was done, 10 cc of sterile lidocaine jelly were placed into the urethra and 30 cc of sterile water replaced into a 24-French 3-way hematuria Foley catheter.  This was placed on mild traction and irrigated clear.  Continuous bladder irrigation was connected and run.  B and O suppository was placed into his rectum.  He was placed back in supine position.  Anesthesia was reversed and taken to PACU in stable condition.  He will be monitored overnight with continuous bladder irrigation.          ______________________________ Natalia Leatherwood, MD     DW/MEDQ  D:  01/23/2011  T:  01/23/2011  Job:  [id number]

## 2011-01-23 NOTE — Brief Op Note (Signed)
01/23/2011  10:26 AM  PATIENT:  Lance Powell  78 y.o. male  PRE-OPERATIVE DIAGNOSIS:  URINARY RETENTION, BLADDER NECK CONTRACTURE  POST-OPERATIVE DIAGNOSIS:  URINARY RETENTION, BLADDER NECK CONTRACTURE  PROCEDURE:  Procedure(s): TRANSURETHRAL RESECTION OF THE PROSTATE WITH GYRUS INSTRUMENTS CYSTOSCOPY Transurethral resection of bladder neck contracture.  SURGEON:  Surgeon(s): Milford Cage, MD  PHYSICIAN ASSISTANT: None  ASSISTANTS: none   ANESTHESIA:   general  EBL:  Total I/O In: 300 [I.V.:300] Out: -   BLOOD ADMINISTERED:none  DRAINS: Urinary Catheter (Foley)   LOCAL MEDICATIONS USED:  OTHER 10cc lidocaine jelly in urethra.  SPECIMEN:  Source of Specimen:  prostate  DISPOSITION OF SPECIMEN:  PATHOLOGY  COUNTS:  YES  TOURNIQUET:  * No tourniquets in log *  DICTATION: .Other Dictation: Dictation Number [id number]  PLAN OF CARE: Admit for overnight observation  PATIENT DISPOSITION:  PACU - hemodynamically stable.   Delay start of Pharmacological VTE agent (>24hrs) due to surgical blood loss or risk of bleeding: Yes

## 2011-01-23 NOTE — Anesthesia Procedure Notes (Signed)
Procedure Name: LMA Insertion Date/Time: 01/23/2011 8:52 AM Performed by: Huel Coventry Pre-anesthesia Checklist: Patient identified, Emergency Drugs available, Suction available and Patient being monitored Patient Re-evaluated:Patient Re-evaluated prior to inductionOxygen Delivery Method: Circle System Utilized Preoxygenation: Pre-oxygenation with 100% oxygen Intubation Type: IV induction Ventilation: Mask ventilation without difficulty LMA: LMA with gastric port inserted LMA Size: 5.0 Number of attempts: 1 Placement Confirmation: positive ETCO2 Tube secured with: Tape Dental Injury: Teeth and Oropharynx as per pre-operative assessment

## 2011-01-23 NOTE — Progress Notes (Signed)
Report given to Tilford Pillar RN. Pt arrived to [facility] 2 via stretcher to pt's bed without difficulty

## 2011-01-23 NOTE — Anesthesia Preprocedure Evaluation (Signed)
Anesthesia Evaluation  Patient identified by MRN, date of birth, ID band Patient awake    Reviewed: Allergy & Precautions, H&P , NPO status , Patient's Chart, lab work & pertinent test results  Airway Mallampati: II TM Distance: >3 FB Neck ROM: Full    Dental No notable dental hx.    Pulmonary neg pulmonary ROS, shortness of breath and with exertion, sleep apnea and Continuous Positive Airway Pressure Ventilation , COPDformer smoker clear to auscultation  Pulmonary exam normal       Cardiovascular + DOE and neg cardio ROS Regular Normal    Neuro/Psych Negative Neurological ROS  Negative Psych ROS   GI/Hepatic negative GI ROS, Neg liver ROS,   Endo/Other  Negative Endocrine ROSDiabetes mellitus-, Type 2, Oral Hypoglycemic Agents  Renal/GU negative Renal ROS  Genitourinary negative   Musculoskeletal negative musculoskeletal ROS (+) Arthritis -, Rheumatoid disorders,    Abdominal   Peds negative pediatric ROS (+)  Hematology negative hematology ROS (+)   Anesthesia Other Findings   Reproductive/Obstetrics negative OB ROS                           Anesthesia Physical Anesthesia Plan  ASA: III  Anesthesia Plan: General   Post-op Pain Management:    Induction: Intravenous  Airway Management Planned: LMA  Additional Equipment:   Intra-op Plan:   Post-operative Plan: Extubation in OR  Informed Consent: I have reviewed the patients History and Physical, chart, labs and discussed the procedure including the risks, benefits and alternatives for the proposed anesthesia with the patient or authorized representative who has indicated his/her understanding and acceptance.   Dental advisory given  Plan Discussed with: CRNA  Anesthesia Plan Comments: (Will minimize fluids intraop to protect lungs per discussion with Dr. Margarita Grizzle.  Pt. Will stay overnight and be slowly rehydrated.)         Anesthesia Quick Evaluation

## 2011-01-23 NOTE — Progress Notes (Signed)
BMET @ drawn by lab @ bedside as ordered per Dr Margarita Grizzle

## 2011-01-23 NOTE — Progress Notes (Signed)
GU   I visited the patient this afternoon.  He was doing well. Pain controlled.  We discussed the surgery and the expected post-surgical course.  PE: Gen: AAO Abd: soft, NTTP GU:  Foley draining clear fluid, CBI running at slow drip  A/P: TURP completed today -Evaluate in morning for discharge home. -Turn down IV fluids to [facility] (ordered earlier today when I saw him).

## 2011-01-23 NOTE — Anesthesia Postprocedure Evaluation (Signed)
  Anesthesia Post-op Note  Patient: Lance Powell  Procedure(s) Performed:  TRANSURETHRAL RESECTION OF THE PROSTATE WITH GYRUS INSTRUMENTS; CYSTOSCOPY  Patient Location: PACU  Anesthesia Type: General  Level of Consciousness: awake and alert   Airway and Oxygen Therapy: Patient Spontanous Breathing  Post-op Pain: mild  Post-op Assessment: Post-op Vital signs reviewed, Patient's Cardiovascular Status Stable, Respiratory Function Stable, Patent Airway and No signs of Nausea or vomiting  Post-op Vital Signs: stable  Complications: No apparent anesthesia complications

## 2011-01-23 NOTE — Progress Notes (Signed)
Report received from Sharon RN.

## 2011-01-23 NOTE — H&P (Signed)
Chief Complaint  Urinary retention   History of Present Illness          78 year old male.  He has a history of enlarged prostate and urinary retention He failed a voiding trial.  Urodynamics revealed that he had a weak bladder and prostatic obstruction. We discussed different interventions for treatment and the patient elected to have a TURP. We extensively discussed the procedure, expected post-operative course, risks, benefits, alternatives, and likelihood of achieving his goals.   He has been cleared by his pulmonologist, Dr. Shelle Iron, as low-mod risk. He had a negative urine culture earlier this month. He is taking Avodart samples which were started at his last clinic visit to decrease his risk of bleeding with the procedure.    Past Medical History Problems  1. History of  Anxiety (Symptom) 300.00 2. History of  Arthritis V13.4 3. History of  Diabetes Mellitus 250.00 4. History of  Esophageal Reflux 530.81 5. History of  Hypercholesterolemia 272.0 6. History of  Murmurs 785.2 7. History of  Peptic Ulcer V12.71  Surgical History Problems  1. History of  Appendectomy 2. History of  Incisional Hernia Repair 3. History of  Inguinal Hernia Repair 4. History of  Surgery Of Male Genitalia Vasectomy V25.2 5. History of  Tonsillectomy 6. History of  Transurethral Resection Of Prostate (TURP)  Current Meds 1. Aspirin 81 MG Oral Tablet; Therapy: (Recorded:19Jun2009) to 2. CeleBREX 200 MG Oral Capsule; Therapy: (Recorded:19Jun2009) to 3. Centrum Silver TABS; Therapy: (Recorded:19Jun2009) to 4. Lipitor 40 MG Oral Tablet; Therapy: (Recorded:19Jun2009) to 5. MetFORMIN HCl 500 MG Oral Tablet; Therapy: (Recorded:19Jun2009) to 6. Misoprostol 100 MCG Oral Tablet; Therapy: (Recorded:19Jun2009) to 7. Oxybutynin Chloride 5 MG Oral Tablet; TAKE 1 TABLET 3 times daily PRN BLADDER SPASMS;  Therapy: 10Dec2012 to (Last Rx:10Dec2012)  Requested for: 10Dec2012 8. PARoxetine HCl 20 MG Oral Tablet;  Therapy: (Recorded:19Jun2009) to 9. Vitamin D TABS; Therapy: (Recorded:19Jun2009) to  Allergies Medication  1. No Known Drug Allergies  Family History Problems  1. Paternal history of  Death In The Family Father PneumoniaAge 79 2. Maternal history of  Death In The Family Mother Coronary ThrombosisAge 71 3. Family history of  Family Health Status Number Of Children 2-sons4-daughters 4. Paternal history of  Nephrolithiasis 5. Paternal history of  Pneumonia  Social History Problems  1. Former Smoker Smoked for 3-48yrs and stopped for 21yrs 2. Marital History - Currently Married 3. Occupation: Energy manager  4. History of  Alcohol Use 5. History of  Caffeine Use  Review of Systems Constitutional, skin, eye, otolaryngeal, hematologic/lymphatic, cardiovascular, pulmonary, endocrine, musculoskeletal, gastrointestinal, neurological and psychiatric system(s) were reviewed and pertinent findings if present are noted.  Respiratory: shortness of breath and cough.     Physical Exam Constitutional: Well nourished and well developed.  ENT:. The ears and nose are normal in appearance. The oropharynx is normal.  Neck: The appearance of the neck is normal and no neck mass is present.  Pulmonary: No respiratory distress and normal respiratory rhythm and effort.  Cardiovascular: Heart rate and rhythm are normal . No peripheral edema.  Abdomen: The abdomen is obese. The abdomen is soft and nontender. The abdomen is no rebound. No CVA tenderness.  Lymphatics: The posterior cervical and supraclavicular nodes are not enlarged or tender.  Skin: Normal skin turgor and no visible rash.  Neuro/Psych:. Mood and affect are appropriate. No focal sensory deficits.    Assessment Acute Urinary Retention  Benign Prostatic Hypertrophy With Urinary Obstruction   Plan To OR for  TURP.

## 2011-01-24 DIAGNOSIS — N400 Enlarged prostate without lower urinary tract symptoms: Secondary | ICD-10-CM | POA: Diagnosis present

## 2011-01-24 DIAGNOSIS — N320 Bladder-neck obstruction: Secondary | ICD-10-CM | POA: Diagnosis present

## 2011-01-24 LAB — GLUCOSE, CAPILLARY

## 2011-01-24 MED ORDER — OXYCODONE HCL 5 MG PO TABS: 5.0000 mg | ORAL_TABLET | ORAL | Status: DC | PRN

## 2011-01-24 MED ORDER — SENNOSIDES-DOCUSATE SODIUM 8.6-50 MG PO TABS: 1.0000 | ORAL_TABLET | Freq: Two times a day (BID) | ORAL | Status: DC

## 2011-01-24 MED ORDER — CIPROFLOXACIN HCL 500 MG PO TABS: 500.0000 mg | ORAL_TABLET | Freq: Two times a day (BID) | ORAL | Status: DC

## 2011-01-24 MED ORDER — BACITRACIN-NEOMYCIN-POLYMYXIN 400-5-5000 EX OINT: 1.0000 "application " | TOPICAL_OINTMENT | Freq: Three times a day (TID) | CUTANEOUS | Status: AC | PRN

## 2011-01-24 MED ORDER — POLYETHYLENE GLYCOL 3350 17 G PO PACK: 17.0000 g | PACK | Freq: Every day | ORAL | Status: AC

## 2011-01-24 NOTE — Discharge Summary (Signed)
Physician Discharge Summary  Patient ID: Lance Powell MRN: [id number] DOB/AGE: 1929/11/09 78 y.o.  Admit date: 01/23/2011 Discharge date: 01/24/2011  Admission Diagnoses: Benign prostate hyperplasia.  Bladder neck contracture.  Discharge Diagnoses:  Benign prostate hyperplasia.  Bladder neck contracture.  Discharged Condition: good  Hospital Course:  Patient was admitted overnight for continuous bladder irrigation following TURP.  His CBI was turned off the following morning and remained pink.  His pain was controled and he was able to tolerated solid food. It was felt he could be discharged home and he agreed.  Consults: none  Significant Diagnostic Studies: None  Treatments: surgery: TURP  Discharge Exam: Blood pressure 102/63, pulse 77, temperature 97.6 F (36.4 C), temperature source Oral, resp. rate 18, height 6' (1.829 m), weight 115.667 kg (255 lb), SpO2 92.00%. See progress note from date of discharge.  Disposition:   Discharge Orders    Future Appointments: Provider: Department: Dept Phone: Center:   02/01/2011 9:40 AM Currie Paris, MD [facility] [phone] None   04/12/2011 2:00 PM Barbaraann Share, MD [facility] [phone] None     Future Orders Please Complete By Expires   Discharge patient        Medication List  As of 01/24/2011  7:17 AM   START taking these medications         ciprofloxacin 500 MG tablet   Commonly known as: CIPRO   Take 1 tablet (500 mg total) by mouth 2 (two) times daily.      neomycin-bacitracin-polymyxin ointment   Commonly known as: NEOSPORIN   Apply 1 application topically 3 (three) times daily as needed (catheter irritation). apply to tip of penis      oxyCODONE 5 MG immediate release tablet   Commonly known as: Oxy IR/ROXICODONE   Take 1-2 tablets (5-10 mg total) by mouth every 4 (four) hours as needed for pain.      polyethylene glycol packet   Commonly known as: MIRALAX / GLYCOLAX   Take 17 g by mouth  daily.      senna-docusate 8.6-50 MG per tablet   Commonly known as: Senokot-S   Take 1 tablet by mouth 2 (two) times daily.         CONTINUE taking these medications         AMBULATORY NON FORMULARY MEDICATION   Place 1 application rectally 3 (three) times daily. Medication Name: Diltiazem 2%      atorvastatin 40 MG tablet   Commonly known as: LIPITOR      CENTRUM tablet      cholecalciferol 1000 UNITS tablet   Commonly known as: VITAMIN D      metFORMIN 750 MG 24 hr tablet   Commonly known as: GLUCOPHAGE-XR      misoprostol 100 MCG tablet   Commonly known as: CYTOTEC      PARoxetine 20 MG tablet   Commonly known as: PAXIL         STOP taking these medications         aspirin 81 MG tablet      celecoxib 200 MG capsule         ASK your doctor about these medications         albuterol 108 (90 BASE) MCG/ACT inhaler   Commonly known as: PROVENTIL HFA;VENTOLIN HFA   Inhale 2 puffs into the lungs every 6 (six) hours as needed for wheezing or shortness of breath.          Where to get  your medications    These are the prescriptions that you need to pick up.   You may get these medications from any pharmacy.         ciprofloxacin 500 MG tablet   neomycin-bacitracin-polymyxin ointment   oxyCODONE 5 MG immediate release tablet   polyethylene glycol packet   senna-docusate 8.6-50 MG per tablet           Follow-up Information    Follow up with [facility], NP on 01/30/2011. (9:30 am for catheter removal.)    Contact information:   [address] [facility] [place] [address] [phone]          Signed: Gerhard, [place] 01/24/2011, 7:17 AM

## 2011-01-24 NOTE — Progress Notes (Signed)
Urology Progress Note  Subjective:     No acute urologic events. CBI turned off at 05:00 this morning.  Urine remains light pink to pink.  Pain controlled.  Objective:  Patient Vitals for the past 24 hrs:  BP Temp Temp src Pulse Resp SpO2  01/24/11 0601 102/63 mmHg 97.6 F (36.4 C) Oral 77  18  92 %  01/23/11 2200 106/61 mmHg 97.2 F (36.2 C) Oral 74  18  90 %  01/23/11 1930 118/64 mmHg - - 79  20  92 %  01/23/11 1759 96/60 mmHg 97.1 F (36.2 C) - 78  18  93 %  01/23/11 1500 121/63 mmHg 97 F (36.1 C) - 83  18  93 %  01/23/11 1250 115/71 mmHg 97 F (36.1 C) - 94  18  94 %  01/23/11 1230 123/73 mmHg - - 100  19  90 %  01/23/11 1215 110/73 mmHg - - 89  16  89 %  01/23/11 1200 99/57 mmHg - - 91  16  90 %  01/23/11 1145 97/65 mmHg - - 91  16  92 %  01/23/11 1144 - - - 89  12  90 %  01/23/11 1142 - - - 89  14  90 %  01/23/11 1140 - - - 90  15  90 %  01/23/11 1138 - - - 94  11  90 %  01/23/11 1136 - - - 96  13  90 %  01/23/11 1134 - - - 92  16  90 %  01/23/11 1132 - - - 93  19  89 %  01/23/11 1130 - - - 92  17  89 %  01/23/11 1128 - - - 94  18  88 %  01/23/11 1126 - - - 94  15  89 %  01/23/11 1124 - - - 95  17  89 %  01/23/11 1122 - - - 94  15  86 %  01/23/11 1120 - - - 95  19  91 %  01/23/11 1118 - - - 98  21  92 %  01/23/11 1116 - - - 96  22  92 %  01/23/11 1115 115/91 mmHg - - 97  19  92 %  01/23/11 1114 - - - 98  19  92 %  01/23/11 1112 - - - 98  17  95 %  01/23/11 1110 - - - 92  17  94 %  01/23/11 1108 - - - 92  17  94 %  01/23/11 1106 - - - 93  16  94 %  01/23/11 1104 - - - 96  23  93 %  01/23/11 1102 - - - 95  17  95 %  01/23/11 1100 124/67 mmHg - - 96  16  94 %  01/23/11 1058 - - - 97  19  94 %  01/23/11 1056 - - - 96  18  94 %  01/23/11 1045 129/77 mmHg - - 97  16  95 %  01/23/11 1039 - 97.4 F (36.3 C) - - 18  -  01/23/11 0807 129/80 mmHg 97.3 F (36.3 C) Oral 91  20  90 %    Physical Exam: General:  No acute distress, awake Cardiovascular:    [ x ]   S1/S2 present, RRR  [  ]  Irregularly irregular Chest:  CTA-B Abdomen:               [  ]  Soft, appropriately TTP  [ x ] Soft, NTTP  [  ] Soft, appropriately TTP, incision(s) clean/dry/intact  Genitourinary:  CBI turned off.  Foley:  Draining light pink to pink urine.    I/O last 3 completed shifts: In: 4112 [P.O.:2880; I.V.:932; IV Piggyback:300] Out: 3950 [Urine:3950]     Assessment: POD#1 TURP Plan: -Ambulate -Discharge home   Natalia Leatherwood, MD [phone]

## 2011-01-25 ENCOUNTER — Encounter (HOSPITAL_BASED_OUTPATIENT_CLINIC_OR_DEPARTMENT_OTHER): Payer: Self-pay | Admitting: Urology

## 2011-02-01 ENCOUNTER — Ambulatory Visit (INDEPENDENT_AMBULATORY_CARE_PROVIDER_SITE_OTHER): Payer: Medicare Other | Admitting: Surgery

## 2011-02-01 ENCOUNTER — Encounter (INDEPENDENT_AMBULATORY_CARE_PROVIDER_SITE_OTHER): Payer: Self-pay | Admitting: Surgery

## 2011-02-01 VITALS — BP 130/78 | HR 100 | Temp 97.6°F | Resp 16 | Ht 72.0 in | Wt 256.0 lb

## 2011-02-01 DIAGNOSIS — K602 Anal fissure, unspecified: Secondary | ICD-10-CM

## 2011-02-01 NOTE — Progress Notes (Signed)
Chief complaint: Followup anal fissure.  History of present illness: I saw this patient about six weeks ago and he was found to have a small posterior anal fissure. We treated him with some diltiazem cream. He has become asymptomatic. He is having no rectal or anal pain. He have no more bleeding.  Past history and review of systems: Since I last saw him he did have problems with his prostate and had to have a TURP. He was catheterized until two days ago but now is voiding okay.  Exam: Gen.: The patient is alert oriented healthy appearing and in no distress Rectal: External exam is basically normal. There are a few skin tags noted. There is no fissure noted. Digital rectal exam is negative.  Anoscopy: Anoscopic exam shows no evidence of significant anorectal disease. There is no fissure or significant hemorrhoids and no mass. Impression: Resolved fissure in ano  Plan: RTC PRN

## 2011-02-25 ENCOUNTER — Telehealth: Payer: Self-pay | Admitting: Pulmonary Disease

## 2011-02-25 MED ORDER — TIOTROPIUM BROMIDE MONOHYDRATE 18 MCG IN CAPS: 18.0000 ug | ORAL_CAPSULE | Freq: Every day | RESPIRATORY_TRACT | Status: DC

## 2011-02-25 MED ORDER — ALBUTEROL SULFATE HFA 108 (90 BASE) MCG/ACT IN AERS: 2.0000 | INHALATION_SPRAY | Freq: Four times a day (QID) | RESPIRATORY_TRACT | Status: DC | PRN

## 2011-02-25 NOTE — Telephone Encounter (Signed)
Pt's last ov with [facility] was 12.21.12 - was given samples spiriva and told to call if this worked well for him.  Per patient, would like refills spiriva and proair to [facility].  Refills sent, pt is aware.  *[facility]  [facility] to call the rx's but was told that they were unable to locate the patient in their system using pt's name, dob, zip code and ID number from scanned insurance card as identifyers.  rx's resent to [facility].

## 2011-04-12 ENCOUNTER — Encounter: Payer: Self-pay | Admitting: Pulmonary Disease

## 2011-04-12 ENCOUNTER — Ambulatory Visit (INDEPENDENT_AMBULATORY_CARE_PROVIDER_SITE_OTHER): Payer: Medicare Other | Admitting: Pulmonary Disease

## 2011-04-12 VITALS — BP 124/70 | HR 109 | Temp 97.7°F | Ht 72.0 in | Wt 251.4 lb

## 2011-04-12 DIAGNOSIS — J449 Chronic obstructive pulmonary disease, unspecified: Secondary | ICD-10-CM

## 2011-04-12 DIAGNOSIS — G4733 Obstructive sleep apnea (adult) (pediatric): Secondary | ICD-10-CM

## 2011-04-12 NOTE — Assessment & Plan Note (Signed)
The patient is wearing CPAP compliantly, and feels that it is helping his sleep and daytime alertness.

## 2011-04-12 NOTE — Patient Instructions (Signed)
Take spiriva one inhalation each am everyday.  Let me know if you feel it is not helping your breathing after 6-8 weeks Stay on cpap Work on weight loss, staying active.  followup with me in 6mos.

## 2011-04-12 NOTE — Assessment & Plan Note (Signed)
The patient has seen an improvement with Spiriva initially, and then quit taking it on a consistent basis.  I have asked him to get back on this medication to see if his exertional tolerance is improved if he takes it on a regular basis.  I also asked him to work on conditioning and modest weight loss.

## 2011-04-12 NOTE — Progress Notes (Signed)
  Subjective:    Patient ID: Lance Powell, male    DOB: 12-25-1929, 78 y.o.   MRN: [id number]  HPI The patient comes in today for followup of his known COPD and also sleep apnea.  He is wearing his CPAP device compliantly, and feels that it is helping his sleep and daytime alertness.  He was prescribed Spiriva at the last visit for his COPD, but has only been taking the Spiriva sporadically.  He did not realize he was supposed to take it daily.   Review of Systems  Constitutional: Negative for fever and unexpected weight change.  HENT: Positive for sore throat and rhinorrhea. Negative for ear pain, nosebleeds, congestion, sneezing, trouble swallowing, dental problem, postnasal drip and sinus pressure.   Eyes: Negative for redness and itching.  Respiratory: Negative for cough, chest tightness, shortness of breath and wheezing.   Cardiovascular: Negative for palpitations and leg swelling.  Gastrointestinal: Negative for nausea and vomiting.  Genitourinary: Negative for dysuria.  Musculoskeletal: Negative for joint swelling.  Skin: Negative for rash.  Neurological: Negative for headaches.  Hematological: Does not bruise/bleed easily.  Psychiatric/Behavioral: Negative for dysphoric mood. The patient is not nervous/anxious.        Objective:   Physical Exam Well-developed male in no acute distress Nose without purulence or discharge noted. no skin breakdown or pressure necrosis from CPAP mask Chest with mild decrease in breath sounds, otherwise totally clear Cardiac exam is regular rate and rhythm Lower extremities with minimal edema, no cyanosis  alert and oriented, moves all 4 extremities.      Assessment & Plan:

## 2011-10-11 ENCOUNTER — Ambulatory Visit: Payer: Medicare Other | Admitting: Pulmonary Disease

## 2011-10-18 ENCOUNTER — Encounter: Payer: Self-pay | Admitting: Pulmonary Disease

## 2011-10-18 ENCOUNTER — Ambulatory Visit (INDEPENDENT_AMBULATORY_CARE_PROVIDER_SITE_OTHER): Payer: Medicare Other | Admitting: Pulmonary Disease

## 2011-10-18 ENCOUNTER — Other Ambulatory Visit: Payer: Self-pay | Admitting: Pulmonary Disease

## 2011-10-18 ENCOUNTER — Encounter: Payer: Self-pay | Admitting: Gastroenterology

## 2011-10-18 VITALS — BP 108/78 | HR 113 | Temp 98.4°F | Ht 72.0 in | Wt 257.8 lb

## 2011-10-18 DIAGNOSIS — J449 Chronic obstructive pulmonary disease, unspecified: Secondary | ICD-10-CM

## 2011-10-18 DIAGNOSIS — G4733 Obstructive sleep apnea (adult) (pediatric): Secondary | ICD-10-CM

## 2011-10-18 DIAGNOSIS — J4489 Other specified chronic obstructive pulmonary disease: Secondary | ICD-10-CM

## 2011-10-18 MED ORDER — INDACATEROL MALEATE 75 MCG IN CAPS: 1.0000 | ORAL_CAPSULE | RESPIRATORY_TRACT | Status: DC

## 2011-10-18 NOTE — Progress Notes (Signed)
  Subjective:    Patient ID: Lance Powell, male    DOB: 08/15/1929, 78 y.o.   MRN: [id number]  HPI The patient comes in today for followup of his known COPD, and also obstructive sleep apnea.  He is wearing CPAP compliantly by his download, and feels that it continues to help his sleep and daytime alertness.  He has also tried Spiriva on a regular basis and did not feel it helped his breathing.  I suspect his obesity and deconditioning are the primary issues for his symptoms.   Review of Systems  Constitutional: Negative for fever and unexpected weight change.  HENT: Negative for ear pain, nosebleeds, congestion, sore throat, rhinorrhea, sneezing, trouble swallowing, dental problem, postnasal drip and sinus pressure.   Eyes: Negative for redness and itching.  Respiratory: Positive for cough (grayish-white mucus), shortness of breath and wheezing. Negative for chest tightness.   Cardiovascular: Negative for palpitations and leg swelling.  Gastrointestinal: Negative for nausea and vomiting.  Genitourinary: Negative for dysuria.       Weak bladder---catheterization after every urination  Musculoskeletal: Negative for joint swelling.  Skin: Negative for rash.  Neurological: Negative for headaches.  Hematological: Does not bruise/bleed easily.  Psychiatric/Behavioral: Negative for dysphoric mood. The patient is not nervous/anxious.        Objective:   Physical Exam Overweight male in no acute distress No skin breakdown or pressure necrosis from the CPAP mask Nose without purulent discharge noted Chest totally clear to auscultation, no wheezing Cardiac exam with regular rate and rhythm Lower extremities with no significant edema, no cyanosis Alert and oriented, moves all 4 extremities.       Assessment & Plan:

## 2011-10-18 NOTE — Patient Instructions (Addendum)
Will try arcapta one inhalation each am.  Please call after 4 weeks to let me know if you think it helps. Stop spiriva during the time you are trying the arcapta Can still use albuterol as needed for rescue Stay on cpap, and will send an order to your company to check your hose. Work on [employer] loss Will give you a flu shot today.  followup with me in 6mos

## 2011-10-18 NOTE — Assessment & Plan Note (Signed)
The patient did not see a big change in his breathing with Spiriva, and we'll therefore give him a trial of a LABA.  If this does not help, I would probably just leave him on a rescue inhaler alone, and ask him to work more aggressively on weight loss and conditioning.

## 2011-10-18 NOTE — Assessment & Plan Note (Signed)
The patient is wearing CPAP compliantly by his downloaded, and shows excellent control of his sleep apnea.

## 2011-11-15 ENCOUNTER — Ambulatory Visit (INDEPENDENT_AMBULATORY_CARE_PROVIDER_SITE_OTHER): Payer: Medicare Other | Admitting: Gastroenterology

## 2011-11-15 ENCOUNTER — Encounter: Payer: Self-pay | Admitting: Gastroenterology

## 2011-11-15 VITALS — BP 128/74 | HR 96 | Ht 72.0 in | Wt 257.8 lb

## 2011-11-15 DIAGNOSIS — Z1211 Encounter for screening for malignant neoplasm of colon: Secondary | ICD-10-CM | POA: Insufficient documentation

## 2011-11-15 DIAGNOSIS — L290 Pruritus ani: Secondary | ICD-10-CM | POA: Insufficient documentation

## 2011-11-15 MED ORDER — HYDROCORTISONE ACETATE 25 MG RE SUPP: 25.0000 mg | Freq: Two times a day (BID) | RECTAL | Status: DC

## 2011-11-15 MED ORDER — HYDROCORTISONE 2.5 % RE CREA: TOPICAL_CREAM | Freq: Two times a day (BID) | RECTAL | Status: DC

## 2011-11-15 MED ORDER — NA SULFATE-K SULFATE-MG SULF 17.5-3.13-1.6 GM/177ML PO SOLN: 1.0000 | Freq: Once | ORAL | Status: DC

## 2011-11-15 NOTE — Assessment & Plan Note (Addendum)
I suspect patient has hemorrhoids contributing to his symptoms.  Recommendations #1 Anusol a.c. suppositories and cream #2 Tucks pads #3 fiber supplementation #4 to consider band ligation if medical therapy is unsuccessful and he appears to be symptomatic from internal hemorrhoids

## 2011-11-15 NOTE — Progress Notes (Signed)
History of Present Illness: Is an 78 year old white male referred for evaluation of rectal discomfort and for colonoscopy. He's complaining of severe perirectal irritation following a bowel movement. He's tried various topicals without relief. Stools are soft.   He has a moderate amount of stranding with bowel movements. Lasts colonoscopy was over 10 years ago and he has expressed desire to be examined. There is no history of rectal bleeding or change in bowel habits.  He has a history of an anal fissure.    Past Medical History  Diagnosis Date  . Osteoarthritis   . Rheumatoid arthritis   . Hyperlipemia   . Diabetes mellitus   . COPD (chronic obstructive pulmonary disease)   . Sleep apnea     cpap   Past Surgical History  Procedure Date  . Appendectomy 1969  . Nissen fundoplication   . Hernia repair 1990    right and left inguinal  . Vasectomy 1972  . Tonsillectomy 1937  . Transurethral resection of prostate 01/23/2011    Procedure: TRANSURETHRAL RESECTION OF THE PROSTATE WITH GYRUS INSTRUMENTS;  Surgeon: Milford Cage, MD;  Location: [facility];  Service: Urology;  Laterality: N/A;  . Cystoscopy 01/23/2011    Procedure: CYSTOSCOPY;  Surgeon: Milford Cage, MD;  Location: [facility];  Service: Urology;  Laterality: N/A;   family history includes Cancer in his daughter; Emphysema in his brother; and Heart disease in his mother. Current Outpatient Prescriptions  Medication Sig Dispense Refill  . albuterol (PROAIR HFA) 108 (90 BASE) MCG/ACT inhaler Inhale 2 puffs into the lungs every 6 (six) hours as needed for wheezing or shortness of breath.  3 Inhaler  3  . atorvastatin (LIPITOR) 40 MG tablet Take 40 mg by mouth daily.        . cholecalciferol (VITAMIN D) 1000 UNITS tablet Take 2,000 Units by mouth 2 (two) times daily.        . Indacaterol Maleate (ARCAPTA NEOHALER) 75 MCG CAPS Place 1 capsule into inhaler and inhale every morning.  30  capsule  5  . metFORMIN (GLUCOPHAGE-XR) 750 MG 24 hr tablet Take 750 mg by mouth daily with breakfast.        . misoprostol (CYTOTEC) 100 MCG tablet Take 100 mcg by mouth 2 (two) times daily.        . Multiple Vitamins-Minerals (CENTRUM) tablet Take 1 tablet by mouth daily.        Marland Kitchen PARoxetine (PAXIL) 20 MG tablet Take 20 mg by mouth every morning.        . tiotropium (SPIRIVA) 18 MCG inhalation capsule Place 18 mcg into inhaler and inhale daily. Patient states that he does not do this inhaler as Rx'd...3-4 times a week       Allergies as of 11/15/2011  . (No Known Allergies)    reports that he quit smoking about 33 years ago. His smoking use included Cigarettes. He has a 120 pack-year smoking history. He has never used smokeless tobacco. He reports that he does not drink alcohol or use illicit drugs.     Review of Systems: Pertinent positive and negative review of systems were noted in the above HPI section. All other review of systems were otherwise negative.  Vital signs were reviewed in today's medical record Physical Exam: General: Well developed , well nourished, no acute distress Head: Normocephalic and atraumatic Eyes:  sclerae anicteric, EOMI Ears: Normal auditory acuity Mouth: No deformity or lesions Neck: Supple, no masses or thyromegaly Lungs:  Clear throughout to auscultation Heart: Regular rate and rhythm; no murmurs, rubs or bruits Abdomen: Soft, non tender and non distended. No masses, hepatosplenomegaly or hernias noted. Normal Bowel sounds. An incisional hernia is present in the midabdomen. Rectal: The skin around the anus is inflamed and erythematous. There no masses. Musculoskeletal: Symmetrical with no gross deformities  Skin: No lesions on visible extremities Pulses:  Normal pulses noted Extremities: No clubbing, cyanosis, edema or deformities noted Neurological: Alert oriented x 4, grossly nonfocal Cervical Nodes:  No significant cervical adenopathy Inguinal  Nodes: No significant inguinal adenopathy Psychological:  Alert and cooperative. Normal mood and affect

## 2011-11-15 NOTE — Assessment & Plan Note (Signed)
Plan screening colonoscopy 

## 2011-11-15 NOTE — Patient Instructions (Addendum)
Colonoscopy A colonoscopy is an exam to evaluate your entire colon. In this exam, your colon is cleansed. A long fiberoptic tube is inserted through your rectum and into your colon. The fiberoptic scope (endoscope) is a long bundle of enclosed and very flexible fibers. These fibers transmit light to the area examined and send images from that area to your caregiver. Discomfort is usually minimal. You may be given a drug to help you sleep (sedative) during or prior to the procedure. This exam helps to detect lumps (tumors), polyps, inflammation, and areas of bleeding. Your caregiver may also take a small piece of tissue (biopsy) that will be examined under a microscope. LET YOUR CAREGIVER KNOW ABOUT:   Allergies to food or medicine.  Medicines taken, including vitamins, herbs, eyedrops, over-the-counter medicines, and creams.  Use of steroids (by mouth or creams).  Previous problems with anesthetics or numbing medicines.  History of bleeding problems or blood clots.  Previous surgery.  Other health problems, including diabetes and kidney problems.  Possibility of pregnancy, if this applies. BEFORE THE PROCEDURE   A clear liquid diet may be required for 2 days before the exam.  Ask your caregiver about changing or stopping your regular medications.  Liquid injections (enemas) or laxatives may be required.  A large amount of electrolyte solution may be given to you to drink over a short period of time. This solution is used to clean out your colon.  You should be present 60 minutes prior to your procedure or as directed by your caregiver. AFTER THE PROCEDURE   If you received a sedative or pain relieving medication, you will need to arrange for someone to drive you home.  Occasionally, there is a little blood passed with the first bowel movement. Do not be concerned. FINDING OUT THE RESULTS OF YOUR TEST Not all test results are available during your visit. If your test results are  not back during the visit, make an appointment with your caregiver to find out the results. Do not assume everything is normal if you have not heard from your caregiver or the medical facility. It is important for you to follow up on all of your test results. HOME CARE INSTRUCTIONS   It is not unusual to pass moderate amounts of gas and experience mild abdominal cramping following the procedure. This is due to air being used to inflate your colon during the exam. Walking or a warm pack on your belly (abdomen) may help.  You may resume all normal meals and activities after sedatives and medicines have worn off.  Only take over-the-counter or prescription medicines for pain, discomfort, or fever as directed by your caregiver. Do not use aspirin or blood thinners if a biopsy was taken. Consult your caregiver for medicine usage if biopsies were taken. SEEK IMMEDIATE MEDICAL CARE IF:   You have a fever.  You pass large blood clots or fill a toilet with blood following the procedure. This may also occur 10 to 14 days following the procedure. This is more likely if a biopsy was taken.  You develop abdominal pain that keeps getting worse and cannot be relieved with medicine. Document Released: 01/05/2000 Document Revised: 04/01/2011 Document Reviewed: 08/20/2007 Exit[employer] Patient Information 2013 [employer].  

## 2011-11-20 ENCOUNTER — Encounter: Payer: Self-pay | Admitting: Gastroenterology

## 2011-12-27 ENCOUNTER — Ambulatory Visit (AMBULATORY_SURGERY_CENTER): Payer: Medicare Other | Admitting: Gastroenterology

## 2011-12-27 ENCOUNTER — Encounter: Payer: Self-pay | Admitting: Gastroenterology

## 2011-12-27 VITALS — BP 135/80 | HR 90 | Temp 98.2°F | Resp 27 | Ht 72.0 in | Wt 257.0 lb

## 2011-12-27 DIAGNOSIS — D126 Benign neoplasm of colon, unspecified: Secondary | ICD-10-CM

## 2011-12-27 DIAGNOSIS — K5730 Diverticulosis of large intestine without perforation or abscess without bleeding: Secondary | ICD-10-CM

## 2011-12-27 DIAGNOSIS — Z1211 Encounter for screening for malignant neoplasm of colon: Secondary | ICD-10-CM

## 2011-12-27 DIAGNOSIS — L290 Pruritus ani: Secondary | ICD-10-CM

## 2011-12-27 LAB — GLUCOSE, CAPILLARY: Glucose-Capillary: 96 mg/dL (ref 70–99)

## 2011-12-27 MED ORDER — CLOTRIMAZOLE-BETAMETHASONE 1-0.05 % EX LOTN: TOPICAL_LOTION | Freq: Two times a day (BID) | CUTANEOUS | Status: DC

## 2011-12-27 MED ORDER — SODIUM CHLORIDE 0.9 % IV SOLN: 500.0000 mL | INTRAVENOUS | Status: DC

## 2011-12-27 NOTE — Patient Instructions (Addendum)
Findings:  Polyp, diverticulosis Recommendations:  Lotrisone cream to perianal rash.  YOU HAD AN ENDOSCOPIC PROCEDURE TODAY AT THE  ENDOSCOPY CENTER: Refer to the procedure report that was given to you for any specific questions about what was found during the examination.  If the procedure report does not answer your questions, please call your gastroenterologist to clarify.  If you requested that your care partner not be given the details of your procedure findings, then the procedure report has been included in a sealed envelope for you to review at your convenience later.  YOU SHOULD EXPECT: Some feelings of bloating in the abdomen. Passage of more gas than usual.  Walking can help get rid of the air that was put into your GI tract during the procedure and reduce the bloating. If you had a lower endoscopy (such as a colonoscopy or flexible sigmoidoscopy) you may notice spotting of blood in your stool or on the toilet paper. If you underwent a bowel prep for your procedure, then you may not have a normal bowel movement for a few days.  DIET: Your first meal following the procedure should be a light meal and then it is ok to progress to your normal diet.  A half-sandwich or bowl of soup is an example of a good first meal.  Heavy or fried foods are harder to digest and may make you feel nauseous or bloated.  Likewise meals heavy in dairy and vegetables can cause extra gas to form and this can also increase the bloating.  Drink plenty of fluids but you should avoid alcoholic beverages for 24 hours.  ACTIVITY: Your care partner should take you home directly after the procedure.  You should plan to take it easy, moving slowly for the rest of the day.  You can resume normal activity the day after the procedure however you should NOT DRIVE or use heavy machinery for 24 hours (because of the sedation medicines used during the test).    SYMPTOMS TO REPORT IMMEDIATELY: A gastroenterologist can be reached  at any hour.  During normal business hours, 8:30 AM to 5:00 PM Monday through Friday, call [phone].  After hours and on weekends, please call the GI answering service at [phone] who will take a message and have the physician on call contact you.   Following lower endoscopy (colonoscopy or flexible sigmoidoscopy):  Excessive amounts of blood in the stool  Significant tenderness or worsening of abdominal pains  Swelling of the abdomen that is new, acute  Fever of 100F or higher  Following upper endoscopy (EGD)  Vomiting of blood or coffee ground material  New chest pain or pain under the shoulder blades  Painful or persistently difficult swallowing  New shortness of breath  Fever of 100F or higher  Black, tarry-looking stools  FOLLOW UP: If any biopsies were taken you will be contacted by phone or by letter within the next 1-3 weeks.  Call your gastroenterologist if you have not heard about the biopsies in 3 weeks.  Our staff will call the home number listed on your records the next business day following your procedure to check on you and address any questions or concerns that you may have at that time regarding the information given to you following your procedure. This is a courtesy call and so if there is no answer at the home number and we have not heard from you through the emergency physician on call, we will assume that you have returned to  your regular daily activities without incident.  SIGNATURES/CONFIDENTIALITY: You and/or your care partner have signed paperwork which will be entered into your electronic medical record.  These signatures attest to the fact that that the information above on your After Visit Summary has been reviewed and is understood.  Full responsibility of the confidentiality of this discharge information lies with you and/or your care-partner.   Please follow all discharge instructions given to you by the recovery room nurse. If you have any  questions or problems after discharge please call one of the numbers listed above. You will receive a phone call in the am to see how you are doing and answer any questions you may have. Thank you for choosing [facility] for your health care needs.

## 2011-12-27 NOTE — Progress Notes (Signed)
1338 a/ox3 pleased with mac report to [facility]

## 2011-12-27 NOTE — Progress Notes (Signed)
Patient did not experience any of the following events: a burn prior to discharge; a fall within the facility; wrong site/side/patient/procedure/implant event; or a hospital transfer or hospital admission upon discharge from the facility. (G8907) Patient did not have preoperative order for IV antibiotic SSI prophylaxis. (G8918)  

## 2011-12-27 NOTE — Op Note (Signed)
[facility] [address] [place], [address]   COLONOSCOPY PROCEDURE REPORT  PATIENT: Lance Powell, Lance Powell.  MR#: [id number] BIRTHDATE: [birth year] , 82  yrs. old GENDER: Male ENDOSCOPIST: Louis Meckel, MD REFERRED [id number] Ivory Broad, M.D. PROCEDURE DATE:  12/27/2011 PROCEDURE:   Colonoscopy with snare polypectomy ASA CLASS:   Class II INDICATIONS:2008 colonoscopy - adenomatous polyp.   pruritus and I.  MEDICATIONS: MAC sedation, administered by CRNA and Propofol (Diprivan) 180 mg IV  DESCRIPTION OF PROCEDURE:   After the risks benefits and alternatives of the procedure were thoroughly explained, informed consent was obtained.  A digital rectal exam revealed a rash.   The LB [id number]  endoscope was introduced through the anus and advanced to the cecum, which was identified by both the appendix and ileocecal valve. No adverse events experienced.   The quality of the prep was Suprep excellent  The instrument was then slowly withdrawn as the colon was fully examined.      COLON FINDINGS: A sessile polyp measuring 5 mm in size was found in the descending colon.  A polypectomy was performed using snare cautery.   initially the polyp was removed with a cold polypectomy snare. because of persistent, moderate bleeding a second resection was done utilizing a hot biopsy polypectomy snare  The resection was complete and the polyp tissue was completely retrieved. Moderate diverticulosis was noted in the sigmoid colon, descending colon, transverse colon, ascending colon, and at the cecum.   The colon mucosa was otherwise normal.  Retroflexed views revealed no abnormalities. The time to cecum=2 minutes 58 seconds.  Withdrawal time=13 minutes 32 seconds.  The scope was withdrawn and the procedure completed. COMPLICATIONS: There were no complications.  ENDOSCOPIC IMPRESSION: 1.   Sessile polyp measuring 5 mm in size was found in the descending colon;  polypectomy was performed using snare cautery 2.   Moderate diverticulosis was noted in the sigmoid colon, descending colon, transverse colon, ascending colon, and at the cecum 3.   The colon mucosa was otherwise normal  RECOMMENDATIONS: Given your age, you will not need another colonoscopy for colon cancer screening or polyp surveillance.  These types of tests usually stop around the age 77. Lotrisone Cream to peranal rash   eSigned:  Louis Meckel, MD 12/27/2011 1:39 PM   cc:   PATIENT NAME:  Lance Powell. MR#: [id number]

## 2011-12-30 ENCOUNTER — Telehealth: Payer: Self-pay | Admitting: *Deleted

## 2011-12-30 NOTE — Telephone Encounter (Signed)
  Follow up Call-  Call back number 12/27/2011  Post procedure Call Back phone  # [phone]  Permission to leave phone message Yes     Patient questions:  Do you have a fever, pain , or abdominal swelling? no Pain Score  0 *  Have you tolerated food without any problems? yes  Have you been able to return to your normal activities? yes  Do you have any questions about your discharge instructions: Diet   no Medications  no Follow up visit  no  Do you have questions or concerns about your Care? no  Actions: * If pain score is 4 or above: No action needed, pain <4.

## 2012-01-01 ENCOUNTER — Telehealth: Payer: Self-pay | Admitting: Pulmonary Disease

## 2012-01-01 MED ORDER — INDACATEROL MALEATE 75 MCG IN CAPS: 1.0000 | ORAL_CAPSULE | RESPIRATORY_TRACT | Status: DC

## 2012-01-01 NOTE — Telephone Encounter (Signed)
Rx has been sent to [facility]. Tried calling pt, I received a busy signal. Will try back later.

## 2012-01-03 ENCOUNTER — Encounter: Payer: Self-pay | Admitting: Gastroenterology

## 2012-01-03 NOTE — Telephone Encounter (Signed)
Pt is aware and needed nothing further. Will sign off message

## 2012-01-05 ENCOUNTER — Encounter (HOSPITAL_COMMUNITY): Payer: Self-pay | Admitting: *Deleted

## 2012-01-05 ENCOUNTER — Inpatient Hospital Stay (HOSPITAL_COMMUNITY)
Admission: EM | Admit: 2012-01-05 | Discharge: 2012-01-08 | DRG: 919 | Disposition: A | Discharge status: Home / Self Care | Payer: Medicare Other | Attending: Internal Medicine | Admitting: Internal Medicine

## 2012-01-05 DIAGNOSIS — Z860100 Personal history of colon polyps, unspecified: Secondary | ICD-10-CM

## 2012-01-05 DIAGNOSIS — M1990 Unspecified osteoarthritis, unspecified site: Secondary | ICD-10-CM | POA: Diagnosis present

## 2012-01-05 DIAGNOSIS — K5731 Diverticulosis of large intestine without perforation or abscess with bleeding: Secondary | ICD-10-CM | POA: Diagnosis present

## 2012-01-05 DIAGNOSIS — Z87891 Personal history of nicotine dependence: Secondary | ICD-10-CM

## 2012-01-05 DIAGNOSIS — K625 Hemorrhage of anus and rectum: Secondary | ICD-10-CM

## 2012-01-05 DIAGNOSIS — IMO0002 Reserved for concepts with insufficient information to code with codable children: Principal | ICD-10-CM | POA: Diagnosis present

## 2012-01-05 DIAGNOSIS — J449 Chronic obstructive pulmonary disease, unspecified: Secondary | ICD-10-CM

## 2012-01-05 DIAGNOSIS — Z9089 Acquired absence of other organs: Secondary | ICD-10-CM

## 2012-01-05 DIAGNOSIS — Y849 Medical procedure, unspecified as the cause of abnormal reaction of the patient, or of later complication, without mention of misadventure at the time of the procedure: Secondary | ICD-10-CM | POA: Diagnosis present

## 2012-01-05 DIAGNOSIS — I4949 Other premature depolarization: Secondary | ICD-10-CM | POA: Diagnosis not present

## 2012-01-05 DIAGNOSIS — Z79899 Other long term (current) drug therapy: Secondary | ICD-10-CM

## 2012-01-05 DIAGNOSIS — J4489 Other specified chronic obstructive pulmonary disease: Secondary | ICD-10-CM | POA: Diagnosis present

## 2012-01-05 DIAGNOSIS — K922 Gastrointestinal hemorrhage, unspecified: Secondary | ICD-10-CM

## 2012-01-05 DIAGNOSIS — T39395A Adverse effect of other nonsteroidal anti-inflammatory drugs [NSAID], initial encounter: Secondary | ICD-10-CM

## 2012-01-05 DIAGNOSIS — M069 Rheumatoid arthritis, unspecified: Secondary | ICD-10-CM | POA: Diagnosis present

## 2012-01-05 DIAGNOSIS — Y92009 Unspecified place in unspecified non-institutional (private) residence as the place of occurrence of the external cause: Secondary | ICD-10-CM

## 2012-01-05 DIAGNOSIS — Z7982 Long term (current) use of aspirin: Secondary | ICD-10-CM

## 2012-01-05 DIAGNOSIS — E785 Hyperlipidemia, unspecified: Secondary | ICD-10-CM | POA: Diagnosis present

## 2012-01-05 DIAGNOSIS — G4733 Obstructive sleep apnea (adult) (pediatric): Secondary | ICD-10-CM

## 2012-01-05 DIAGNOSIS — E119 Type 2 diabetes mellitus without complications: Secondary | ICD-10-CM | POA: Diagnosis present

## 2012-01-05 LAB — CBC WITH DIFFERENTIAL/PLATELET
Eosinophils Absolute: 0.1 10*3/uL (ref 0.0–0.7)
Hemoglobin: 15.9 g/dL (ref 13.0–17.0)
Lymphs Abs: 2.5 10*3/uL (ref 0.7–4.0)
MCH: 30.9 pg (ref 26.0–34.0)
MCV: 93.2 fL (ref 78.0–100.0)
Monocytes Relative: 9 % (ref 3–12)
Neutrophils Relative %: 56 % (ref 43–77)
RBC: 5.15 MIL/uL (ref 4.22–5.81)

## 2012-01-05 LAB — COMPREHENSIVE METABOLIC PANEL
Alkaline Phosphatase: 69 U/L (ref 39–117)
BUN: 17 mg/dL (ref 6–23)
GFR calc Af Amer: 78 mL/min — ABNORMAL LOW (ref >90)
GFR calc non Af Amer: 67 mL/min — ABNORMAL LOW (ref >90)
Glucose, Bld: 120 mg/dL — ABNORMAL HIGH (ref 70–99)
Potassium: 4.3 mEq/L (ref 3.5–5.1)
Total Bilirubin: 0.4 mg/dL (ref 0.3–1.2)
Total Protein: 6.5 g/dL (ref 6.0–8.3)

## 2012-01-05 LAB — TYPE AND SCREEN: ABO/RH(D): O POS

## 2012-01-05 MED ORDER — ALBUTEROL SULFATE HFA 108 (90 BASE) MCG/ACT IN AERS
2.0000 | INHALATION_SPRAY | Freq: Four times a day (QID) | RESPIRATORY_TRACT | Status: DC | PRN
  Filled 2012-01-05: qty 6.7

## 2012-01-05 MED ORDER — MISOPROSTOL 100 MCG PO TABS
100.0000 ug | ORAL_TABLET | Freq: Two times a day (BID) | ORAL | Status: DC
Start: 2012-01-05 — End: 2012-01-08
  Administered 2012-01-06 – 2012-01-08 (×6): 100 ug via ORAL
  Filled 2012-01-05 (×8): qty 1

## 2012-01-05 MED ORDER — PAROXETINE HCL 20 MG PO TABS
20.0000 mg | ORAL_TABLET | Freq: Every day | ORAL | Status: DC
  Administered 2012-01-06 – 2012-01-08 (×4): 20 mg via ORAL
  Filled 2012-01-05 (×5): qty 1

## 2012-01-05 MED ORDER — ACETAMINOPHEN 325 MG PO TABS: 650.0000 mg | ORAL_TABLET | Freq: Four times a day (QID) | ORAL | Status: DC | PRN

## 2012-01-05 MED ORDER — PANTOPRAZOLE SODIUM 40 MG IV SOLR
40.0000 mg | Freq: Two times a day (BID) | INTRAVENOUS | Status: DC
  Administered 2012-01-06 – 2012-01-08 (×6): 40 mg via INTRAVENOUS
  Filled 2012-01-05 (×7): qty 40

## 2012-01-05 MED ORDER — ATORVASTATIN CALCIUM 40 MG PO TABS
40.0000 mg | ORAL_TABLET | Freq: Every morning | ORAL | Status: DC
  Administered 2012-01-06 – 2012-01-08 (×3): 40 mg via ORAL
  Filled 2012-01-05 (×3): qty 1

## 2012-01-05 MED ORDER — ALBUTEROL SULFATE (5 MG/ML) 0.5% IN NEBU: 2.5000 mg | INHALATION_SOLUTION | RESPIRATORY_TRACT | Status: AC | PRN

## 2012-01-05 MED ORDER — VITAMIN D3 25 MCG (1000 UNIT) PO TABS
2000.0000 [IU] | ORAL_TABLET | Freq: Two times a day (BID) | ORAL | Status: DC
  Administered 2012-01-06 – 2012-01-08 (×6): 2000 [IU] via ORAL
  Filled 2012-01-05 (×7): qty 2

## 2012-01-05 MED ORDER — INDACATEROL MALEATE 75 MCG IN CAPS: 1.0000 | ORAL_CAPSULE | Freq: Every day | RESPIRATORY_TRACT | Status: DC

## 2012-01-05 MED ORDER — SODIUM CHLORIDE 0.9 % IV BOLUS (SEPSIS)
1000.0000 mL | Freq: Once | INTRAVENOUS | Status: AC
  Administered 2012-01-05: 1000 mL via INTRAVENOUS

## 2012-01-05 MED ORDER — ONDANSETRON HCL 4 MG/2ML IJ SOLN: 4.0000 mg | Freq: Three times a day (TID) | INTRAMUSCULAR | Status: AC | PRN

## 2012-01-05 NOTE — H&P (Signed)
[facility] History and Physical  Lance Powell JYN:[id number] DOB: [birth year] DOA: 01/05/2012  Referring physician:  PCP: Garth Schlatter, MD  Specialists: Dr. Marina Goodell  Chief Complaint: BRBPR  HPI: Lance Powell is a 78 y.o. male  Pt had colonoscopy with polypectomy on 12/06. Now nine days later, he had 3 episodes of BRBPR. Largest amount was cup full which triggered him to come to ED. He denies any other symptoms.   Review of Systems: The patient denies anorexia, fever, weight loss,, vision loss, decreased hearing, hoarseness, chest pain, syncope, dyspnea on exertion, peripheral edema, balance deficits, hemoptysis, abdominal pain, melena, hematochezia, severe indigestion/heartburn, hematuria, incontinence, genital sores, muscle weakness, suspicious skin lesions, transient blindness, difficulty walking, depression, unusual weight change, abnormal bleeding, enlarged lymph nodes, angioedema, and breast masses.    Past Medical History  Diagnosis Date  . Osteoarthritis   . Rheumatoid arthritis   . Hyperlipemia   . Diabetes mellitus   . COPD (chronic obstructive pulmonary disease)   . Sleep apnea     cpap   Past Surgical History  Procedure Date  . Appendectomy 1969  . Nissen fundoplication   . Hernia repair 1990    right and left inguinal  . Vasectomy 1972  . Tonsillectomy 1937  . Transurethral resection of prostate 01/23/2011    Procedure: TRANSURETHRAL RESECTION OF THE PROSTATE WITH GYRUS INSTRUMENTS;  Surgeon: Milford Cage, MD;  Location: [facility];  Service: Urology;  Laterality: N/A;  . Cystoscopy 01/23/2011    Procedure: CYSTOSCOPY;  Surgeon: Milford Cage, MD;  Location: [facility];  Service: Urology;  Laterality: N/A;   Social History:  reports that he quit smoking about 33 years ago. His smoking use included Cigarettes. He has a 120 pack-year smoking history. He has never used smokeless tobacco. He reports that  he does not drink alcohol or use illicit drugs.   No Known Allergies  Family History  Problem Relation Age of Onset  . Emphysema Brother   . Heart disease Mother   . Cancer Daughter     breast    Prior to Admission medications   Medication Sig Start Date End Date Taking? Authorizing Provider  albuterol (PROAIR HFA) 108 (90 BASE) MCG/ACT inhaler Inhale 2 puffs into the lungs every 6 (six) hours as needed for wheezing or shortness of breath. 02/25/11 02/25/12 Yes Tammy S Parrett, NP  aspirin 81 MG tablet Take 81 mg by mouth every morning.   Yes Historical Provider, MD  atorvastatin (LIPITOR) 40 MG tablet Take 40 mg by mouth every morning.    Yes Historical Provider, MD  CELEBREX 200 MG capsule Take 200 mg by mouth every evening.  11/01/11  Yes Historical Provider, MD  cholecalciferol (VITAMIN D) 1000 UNITS tablet Take 2,000 Units by mouth 2 (two) times daily.    Yes Historical Provider, MD  clotrimazole-betamethasone (LOTRISONE) lotion Apply topically 2 (two) times daily. 12/27/11  Yes Louis Meckel, MD  desonide (DESOWEN) 0.05 % lotion  11/01/11  Yes Historical Provider, MD  Indacaterol Maleate (ARCAPTA NEOHALER) 75 MCG CAPS Place 1 capsule into inhaler and inhale every morning. 01/01/12  Yes Barbaraann Share, MD  ketoconazole (NIZORAL) 2 % shampoo  11/01/11  Yes Historical Provider, MD  metFORMIN (GLUCOPHAGE) 850 MG tablet Take 850 mg by mouth every evening.   Yes Historical Provider, MD  misoprostol (CYTOTEC) 100 MCG tablet Take 100 mcg by mouth 2 (two) times daily.     Yes Historical Provider,  MD  Multiple Vitamins-Minerals (CENTRUM) tablet Take 1 tablet by mouth daily.     Yes Historical Provider, MD  PARoxetine (PAXIL) 20 MG tablet Take 20 mg by mouth every morning.     Yes Historical Provider, MD   Physical Exam: Filed Vitals:   01/05/12 1224 01/05/12 1419 01/05/12 1628 01/05/12 1630  BP: 129/83 120/75 121/66 117/71  Pulse: 110  76 79  Temp: 97.9 F (36.6 C)  98.2 F (36.8 C)    TempSrc: Oral  Oral   Resp: 20 19 19 21   SpO2: 91%  93% 92%     General:  A, O x 3, NAD  Eyes: PEERLA, EOMI  ENT: OMM, no thrush  Neck: NO LN or JVD  Cardiovascular: S1S2 reg, no m/r/g  Respiratory: CTAB, no w/r/c  Abdomen: NT, ND, BS +  Skin: No rash or bruises  Musculoskeletal: FROM  Psychiatric: good mood, no depression  Neurologic: NO FND  Labs on Admission:  Basic Metabolic Panel:  Lab 01/05/12 1610  NA 138  K 4.3  CL 103  CO2 28  GLUCOSE 120*  BUN 17  CREATININE 1.02  CALCIUM 10.0  MG --  PHOS --   Liver Function Tests:  Lab 01/05/12 1342  AST 25  ALT 34  ALKPHOS 69  BILITOT 0.4  PROT 6.5  ALBUMIN 3.6   No results found for this basename: LIPASE:5,AMYLASE:5 in the last 168 hours No results found for this basename: AMMONIA:5 in the last 168 hours CBC:  Lab 01/05/12 1342  WBC 7.8  NEUTROABS 4.4  HGB 15.9  HCT 48.0  MCV 93.2  PLT 176   Cardiac Enzymes: No results found for this basename: CKTOTAL:5,CKMB:5,CKMBINDEX:5,TROPONINI:5 in the last 168 hours  BNP (last 3 results) No results found for this basename: PROBNP:3 in the last 8760 hours CBG:  Lab 01/05/12 1318  GLUCAP 104*    Radiological Exams on Admission: No results found.  Assessment/Plan Principal Problem:  *GI bleed Active Problems:  DM  HYPERLIPIDEMIA  OBSTRUCTIVE SLEEP APNEA  ARTHRITIS, RHEUMATOID   1. GI Bleed : telemetry, H/H at 8 pm, type and crossmatch, Dr. Marina Goodell from GI to see patient as per ED MD, protonix, may go home tomorrow if stable 2. DM : SSI for now, He is NPO except meds 3. HL : continue home meds 4. OSA: will ask if he has CPAP at home 5. Arthritis : holding NSAIDS for now.   Code Status: full Family Communication:  Disposition Plan: observation  Time spent: 1 hour  Wandalee Klang V. [facility] Pager [phone]  If 7PM-7AM, please contact night-coverage [email or web address] Password [facility] 01/05/2012, 4:39 PM

## 2012-01-05 NOTE — ED Provider Notes (Addendum)
History     CSN: [id number]  Arrival date & time 01/05/12  1217   First MD Initiated Contact with Patient 01/05/12 1256      Chief Complaint  Patient presents with  . Rectal Bleeding    (Consider location/radiation/quality/duration/timing/severity/associated sxs/prior treatment) HPI Lance Powell is a 78 y.o. male presenting with bright red rectal bleeding. Patient is had 3 instances since yesterday, 2 smaller ones and one large one was about a cup of blood. Patient had colonoscopy 9 days ago by Dr. Arlyce Dice with a single polypectomy performed. Symptoms some have been severe and intermittent. Patient restarted taking daily 81 mg aspirin as well as 200 mg of Celebrex daily. She has no abdominal pain, denies chest pain, shortness of breath, nausea or vomiting, hematemesis, dizziness, or changes in vision. Patient is passing stools amongst the blood.  Past Medical History  Diagnosis Date  . Osteoarthritis   . Rheumatoid arthritis   . Hyperlipemia   . Diabetes mellitus   . COPD (chronic obstructive pulmonary disease)   . Sleep apnea     cpap    Past Surgical History  Procedure Date  . Appendectomy 1969  . Nissen fundoplication   . Hernia repair 1990    right and left inguinal  . Vasectomy 1972  . Tonsillectomy 1937  . Transurethral resection of prostate 01/23/2011    Procedure: TRANSURETHRAL RESECTION OF THE PROSTATE WITH GYRUS INSTRUMENTS;  Surgeon: Milford Cage, MD;  Location: [facility];  Service: Urology;  Laterality: N/A;  . Cystoscopy 01/23/2011    Procedure: CYSTOSCOPY;  Surgeon: Milford Cage, MD;  Location: [facility];  Service: Urology;  Laterality: N/A;    Family History  Problem Relation Age of Onset  . Emphysema Brother   . Heart disease Mother   . Cancer Daughter     breast    History  Substance Use Topics  . Smoking status: Former Smoker -- 4.0 packs/day for 30 years    Types: Cigarettes    Quit date:  01/21/1978  . Smokeless tobacco: Never Used  . Alcohol Use: No      Review of Systems At least 10pt or greater review of systems completed and are negative except where specified in the HPI.  Allergies  Review of patient's allergies indicates no known allergies.  Home Medications   Current Outpatient Rx  Name  Route  Sig  Dispense  Refill  . ALBUTEROL SULFATE HFA 108 (90 BASE) MCG/ACT IN AERS   Inhalation   Inhale 2 puffs into the lungs every 6 (six) hours as needed for wheezing or shortness of breath.   3 Inhaler   3   . ASPIRIN 81 MG PO TABS   Oral   Take 81 mg by mouth every morning.         . ATORVASTATIN CALCIUM 40 MG PO TABS   Oral   Take 40 mg by mouth every morning.          . CELEBREX 200 MG PO CAPS   Oral   Take 200 mg by mouth every evening.          Marland Kitchen VITAMIN D 1000 UNITS PO TABS   Oral   Take 2,000 Units by mouth 2 (two) times daily.          Marland Kitchen CLOTRIMAZOLE-BETAMETHASONE 1-0.05 % EX LOTN   Topical   Apply topically 2 (two) times daily.   30 mL   1   . DESONIDE  0.05 % EX LOTN               . INDACATEROL MALEATE 75 MCG IN CAPS   Inhalation   Place 1 capsule into inhaler and inhale every morning.   90 capsule   1   . KETOCONAZOLE 2 % EX SHAM               . METFORMIN HCL 850 MG PO TABS   Oral   Take 850 mg by mouth every evening.         Marland Kitchen MISOPROSTOL 100 MCG PO TABS   Oral   Take 100 mcg by mouth 2 (two) times daily.           . CENTRUM PO TABS   Oral   Take 1 tablet by mouth daily.           Marland Kitchen PAROXETINE HCL 20 MG PO TABS   Oral   Take 20 mg by mouth every morning.             BP 129/83  Pulse 110  Temp 97.9 F (36.6 C) (Oral)  Resp 20  SpO2 91%  Physical Exam  Nursing notes reviewed.  Electronic medical record reviewed. VITAL SIGNS:   Filed Vitals:   01/05/12 1224 01/05/12 1419 01/05/12 1628 01/05/12 1630  BP: 129/83 120/75 121/66 117/71  Pulse: 110  76 79  Temp: 97.9 F (36.6 C)  98.2 F  (36.8 C)   TempSrc: Oral  Oral   Resp: 20 19 19 21   SpO2: 91%  93% 92%   CONSTITUTIONAL: Awake, oriented, appears non-toxic HENT: Atraumatic, normocephalic, oral mucosa pink and moist, airway patent. Nares patent without drainage. External ears normal. EYES: Conjunctiva clear, EOMI, PERRLA NECK: Trachea midline, non-tender, supple CARDIOVASCULAR: Normal heart rate, Normal rhythm, No murmurs, rubs, gallops PULMONARY/CHEST: Clear to auscultation, no rhonchi, wheezes, or rales. Symmetrical breath sounds. Non-tender. ABDOMINAL: Non-distended, soft, obese, non-tender - no rebound or guarding.  BS normal. Bright red blood around the anus NEUROLOGIC: Non-focal, moving all four extremities, no gross sensory or motor deficits. EXTREMITIES: No clubbing, cyanosis, or edema SKIN: Warm, Dry, No erythema, No rash  ED Course  Procedures (including critical care time)  Date: 01/05/2012  Rate: 89  Rhythm: normal sinus rhythm  QRS Axis: normal  Intervals: First degree AV block  ST/T Wave abnormalities: Nonspecific flattening of T waves in anterior lateral leads-these are seen on prior EKG dated 01/23/2011  Conduction Disutrbances: none  Narrative Interpretation: unremarkable - no significant changes, nonischemic     Labs Reviewed  COMPREHENSIVE METABOLIC PANEL - Abnormal; Notable for the following:    Glucose, Bld 120 (*)     GFR calc non Af Amer 67 (*)     GFR calc Af Amer 78 (*)     All other components within normal limits  GLUCOSE, CAPILLARY - Abnormal; Notable for the following:    Glucose-Capillary 104 (*)     All other components within normal limits  CBC WITH DIFFERENTIAL  TYPE AND SCREEN  ABO/RH  GLUCOSE, CAPILLARY   No results found.   1. Rectal bleeding   2. History of colonoscopy with polypectomy   3. GI bleed due to NSAIDs   4. GI bleed   5. Obstructive sleep apnea (adult) (pediatric)   6. Type II or unspecified type diabetes mellitus without mention of complication, not  stated as uncontrolled       MDM  Lance Powell is a 78 y.o. male  presenting with bright red rectal bleeding since yesterday and, status post polypectomy and takes Celebrex and aspirin daily.  Labs are unremarkable-H&H is adequate, vital signs show some mild tachycardia for she's receiving some fluids. No chest pain or shortness of breath concerning for ACS.  No stools so far in the ER, discussed with Dr. Marina Goodell  GI physician covering for Dr. Arlyce Dice, they will see the patient tomorrow. Will admit patient to telemetry bed for further observation and treatment if necessary.    01/05/2012 4:01 PM Discussed with Dr. Marina Goodell  Discussed with hospitalist Dr. Allena Katz for admission.    Jones Skene, MD 01/05/12 1649  Jones Skene, MD 01/05/12 1701

## 2012-01-05 NOTE — ED Notes (Signed)
CBG registered 82 on ED Glucometer

## 2012-01-05 NOTE — ED Notes (Signed)
RN to obtain labs with start of IV 

## 2012-01-05 NOTE — ED Notes (Signed)
Pt c/o bright red rectal bleeding since yesterday. States had colonoscopy 9 days ago and had polyps removed. Pt denies complaints of pain or nausea.

## 2012-01-06 ENCOUNTER — Encounter: Payer: Self-pay | Admitting: Gastroenterology

## 2012-01-06 DIAGNOSIS — J449 Chronic obstructive pulmonary disease, unspecified: Secondary | ICD-10-CM

## 2012-01-06 DIAGNOSIS — Z860100 Personal history of colon polyps, unspecified: Secondary | ICD-10-CM

## 2012-01-06 DIAGNOSIS — Z9889 Other specified postprocedural states: Secondary | ICD-10-CM

## 2012-01-06 DIAGNOSIS — K922 Gastrointestinal hemorrhage, unspecified: Secondary | ICD-10-CM

## 2012-01-06 DIAGNOSIS — K625 Hemorrhage of anus and rectum: Secondary | ICD-10-CM

## 2012-01-06 LAB — COMPREHENSIVE METABOLIC PANEL
ALT: 34 U/L (ref 0–53)
Albumin: 3.7 g/dL (ref 3.5–5.2)
Alkaline Phosphatase: 67 U/L (ref 39–117)
Chloride: 104 mEq/L (ref 96–112)
GFR calc Af Amer: 88 mL/min — ABNORMAL LOW (ref >90)
Glucose, Bld: 87 mg/dL (ref 70–99)
Potassium: 4.1 mEq/L (ref 3.5–5.1)
Sodium: 137 mEq/L (ref 135–145)
Total Bilirubin: 0.5 mg/dL (ref 0.3–1.2)
Total Protein: 6.4 g/dL (ref 6.0–8.3)

## 2012-01-06 LAB — CBC WITH DIFFERENTIAL/PLATELET
Basophils Absolute: 0.1 10*3/uL (ref 0.0–0.1)
Basophils Relative: 1 % (ref 0–1)
Eosinophils Relative: 3 % (ref 0–5)
HCT: 47.1 % (ref 39.0–52.0)
MCHC: 33.8 g/dL (ref 30.0–36.0)
MCV: 94 fL (ref 78.0–100.0)
Monocytes Absolute: 0.8 10*3/uL (ref 0.1–1.0)
RDW: 14.3 % (ref 11.5–15.5)

## 2012-01-06 LAB — CBC
HCT: 45.3 % (ref 39.0–52.0)
MCH: 30.6 pg (ref 26.0–34.0)
MCHC: 32.9 g/dL (ref 30.0–36.0)
MCV: 93 fL (ref 78.0–100.0)
RDW: 14 % (ref 11.5–15.5)

## 2012-01-06 LAB — GLUCOSE, CAPILLARY

## 2012-01-06 MED ORDER — INSULIN ASPART 100 UNIT/ML ~~LOC~~ SOLN
0.0000 [IU] | Freq: Three times a day (TID) | SUBCUTANEOUS | Status: DC
  Administered 2012-01-06 – 2012-01-08 (×3): 1 [IU] via SUBCUTANEOUS
  Administered 2012-01-08: 2 [IU] via SUBCUTANEOUS

## 2012-01-06 MED ORDER — ZOLPIDEM TARTRATE 5 MG PO TABS
5.0000 mg | ORAL_TABLET | Freq: Once | ORAL | Status: AC
  Administered 2012-01-06: 5 mg via ORAL
  Filled 2012-01-06: qty 1

## 2012-01-06 NOTE — Progress Notes (Signed)
Pt is on home CPAP. Pt takes care of machine.

## 2012-01-06 NOTE — Progress Notes (Signed)
[facility] PROGRESS NOTE  Lance Powell WGN:[id number] DOB: Jun 12, 1929 DOA: 01/05/2012 PCP: Garth Schlatter, MD  Assessment/Plan: 1-GI Bleed: Post Polypectomy Bleed.  HB Q 12 hours. Flex Sig tomorrow if bleeding persist. GI following. Avoid NSAID.  2-DM: Hold metformin while inpatient.  3-HL: Continue with Lipitor.  4-OSA: CIPAP. 5-Arthritis:No NSAID.    Code Status: Full Family Communication: Care discussed with patient.  Disposition Plan: Home when medical stable.    Consultants:  GI, [facility].   Procedures: None. Antibiotics:  None.  HPI/Subjective: Last Bloody bowel movement was this am. No abdominal pain.   Objective: Filed Vitals:   01/05/12 1830 01/05/12 1915 01/05/12 2155 01/06/12 0507  BP: 121/72 144/71 123/70 151/86  Pulse: 75 85 78 94  Temp:  97.3 F (36.3 C) 97.8 F (36.6 C) 97.8 F (36.6 C)  TempSrc:  Oral Oral Oral  Resp: 20 19 20 20   Height:  6' (1.829 m)    Weight:  114.715 kg (252 lb 14.4 oz)    SpO2: 94% 91% 93% 92%    Intake/Output Summary (Last 24 hours) at 01/06/12 1303 Last data filed at 01/06/12 0900  Gross per 24 hour  Intake    180 ml  Output    502 ml  Net   -322 ml   Filed Weights   01/05/12 1915  Weight: 114.715 kg (252 lb 14.4 oz)    Exam:   General:  No distress.  Cardiovascular: S 1, S 2 RRR  Respiratory: CTA  Abdomen: Bs present, soft, Nt  Data Reviewed: Basic Metabolic Panel:  Lab 01/06/12 7846 01/05/12 1342  NA 137 138  K 4.1 4.3  CL 104 103  CO2 21 28  GLUCOSE 87 120*  BUN 14 17  CREATININE 0.98 1.02  CALCIUM 9.4 10.0  MG -- --  PHOS -- --   Liver Function Tests:  Lab 01/06/12 0508 01/05/12 1342  AST 27 25  ALT 34 34  ALKPHOS 67 69  BILITOT 0.5 0.4  PROT 6.4 6.5  ALBUMIN 3.7 3.6   No results found for this basename: LIPASE:5,AMYLASE:5 in the last 168 hours No results found for this basename: AMMONIA:5 in the last 168 hours CBC:  Lab 01/06/12 0508 01/05/12 2130 01/05/12  1342  WBC 10.7* -- 7.8  NEUTROABS 5.6 -- 4.4  HGB 15.9 14.9 15.9  HCT 47.1 45.5 48.0  MCV 94.0 -- 93.2  PLT 170 -- 176   Cardiac Enzymes: No results found for this basename: CKTOTAL:5,CKMB:5,CKMBINDEX:5,TROPONINI:5 in the last 168 hours BNP (last 3 results) No results found for this basename: PROBNP:3 in the last 8760 hours CBG:  Lab 01/05/12 1640 01/05/12 1318  GLUCAP 82 104*    No results found for this or any previous visit (from the past 240 hour(s)).   Studies: No results found.  Scheduled Meds:   . atorvastatin  40 mg Oral q morning - 10a  . cholecalciferol  2,000 Units Oral BID  . Indacaterol Maleate  1 capsule Inhalation Daily  . misoprostol  100 mcg Oral BID  . pantoprazole (PROTONIX) IV  40 mg Intravenous Q12H  . PARoxetine  20 mg Oral Daily   Continuous Infusions:   Principal Problem:  *GI bleed Active Problems:  DM  HYPERLIPIDEMIA  OBSTRUCTIVE SLEEP APNEA  ARTHRITIS, RHEUMATOID  Personal history of colonic polyps    Time spent: 25 minutes    Lance Powell  [facility] Pager [phone]. If 8PM-8AM, please contact night-coverage at [email or web address], password [facility] 01/06/2012, 1:03 PM  LOS: 1 day

## 2012-01-06 NOTE — Consult Note (Signed)
[facility] Consultation  Referring Provider:   [facility] Primary Care Physician:  Garth Schlatter, MD Primary Gastroenterologist:  Melvia Heaps, M.D.     Reason for Consultation:  Rectal bleeding        HPI: Lance Powell is a 78 y.o. male he had a colonoscopy for history of adenomatous colon polyps on 12/8. Findings included a 5mm sessile polyp and diverticulosis. Initially the polyp was removed with a cold polypectomy snare but because of persistent, moderate bleeding a second resection was done utilizing a hot biopsy polypectomy snare. Colon pathology compatible with inflammatory polyp. Patient admitted to the hospital yesterday with bright red blood per rectum. Yesterday hemoglobin was 15.9, down from 17 in January of this year. At 9:30 last night hemoglobin was down to 14.9 but it is back up to 15.9 this a.m.  Patient gives a three-day history of rectal bleeding. Blood bright red to dark red in color. He's had 3-4 episodes of rectal bleeding per day. His last episode was this morning, he estimates about a couple of blood was lost. No abdominal pain. No rectal pain. No dizziness.   Past Medical History  Diagnosis Date  . Osteoarthritis   . Rheumatoid arthritis   . Hyperlipemia   . Diabetes mellitus   . COPD (chronic obstructive pulmonary disease)   . Sleep apnea     cpap    Past Surgical History  Procedure Date  . Appendectomy 1969  . Nissen fundoplication   . Hernia repair 1990    right and left inguinal  . Vasectomy 1972  . Tonsillectomy 1937  . Transurethral resection of prostate 01/23/2011    Procedure: TRANSURETHRAL RESECTION OF THE PROSTATE WITH GYRUS INSTRUMENTS;  Surgeon: Milford Cage, MD;  Location: [facility];  Service: Urology;  Laterality: N/A;  . Cystoscopy 01/23/2011    Procedure: CYSTOSCOPY;  Surgeon: Milford Cage, MD;  Location: [facility];  Service: Urology;  Laterality: N/A;     Family History  Problem Relation Age of Onset  . Emphysema Brother   . Heart disease Mother   . Cancer Daughter     breast    History  Substance Use Topics  . Smoking status: Former Smoker -- 4.0 packs/day for 30 years    Types: Cigarettes    Quit date: 01/21/1978  . Smokeless tobacco: Never Used  . Alcohol Use: No    Prior to Admission medications   Medication Sig Start Date End Date Taking? Authorizing Provider  albuterol (PROAIR HFA) 108 (90 BASE) MCG/ACT inhaler Inhale 2 puffs into the lungs every 6 (six) hours as needed for wheezing or shortness of breath. 02/25/11 02/25/12 Yes Tammy S Parrett, NP  aspirin 81 MG tablet Take 81 mg by mouth every morning.   Yes Historical Provider, MD  atorvastatin (LIPITOR) 40 MG tablet Take 40 mg by mouth every morning.    Yes Historical Provider, MD  CELEBREX 200 MG capsule Take 200 mg by mouth every evening.  11/01/11  Yes Historical Provider, MD  cholecalciferol (VITAMIN D) 1000 UNITS tablet Take 2,000 Units by mouth 2 (two) times daily.    Yes Historical Provider, MD  clotrimazole-betamethasone (LOTRISONE) lotion Apply topically 2 (two) times daily. 12/27/11  Yes Louis Meckel, MD  desonide (DESOWEN) 0.05 % lotion  11/01/11  Yes Historical Provider, MD  Indacaterol Maleate (ARCAPTA NEOHALER) 75 MCG CAPS Place 1 capsule into inhaler and inhale every morning. 01/01/12  Yes Barbaraann Share, MD  ketoconazole (NIZORAL) 2 % shampoo  11/01/11  Yes Historical Provider, MD  metFORMIN (GLUCOPHAGE) 850 MG tablet Take 850 mg by mouth every evening.   Yes Historical Provider, MD  misoprostol (CYTOTEC) 100 MCG tablet Take 100 mcg by mouth 2 (two) times daily.     Yes Historical Provider, MD  Multiple Vitamins-Minerals (CENTRUM) tablet Take 1 tablet by mouth daily.     Yes Historical Provider, MD  PARoxetine (PAXIL) 20 MG tablet Take 20 mg by mouth every morning.     Yes Historical Provider, MD    Current Facility-Administered Medications  Medication  Dose Route Frequency Provider Last Rate Last Dose  . acetaminophen (TYLENOL) tablet 650 mg  650 mg Oral Q6H PRN John-Adam Bonk, MD      . albuterol (PROVENTIL HFA;VENTOLIN HFA) 108 (90 BASE) MCG/ACT inhaler 2 puff  2 puff Inhalation Q6H PRN Tarak V. Allena Katz, MD      . atorvastatin (LIPITOR) tablet 40 mg  40 mg Oral q morning - 10a Tarak V. Allena Katz, MD   40 mg at 01/06/12 0947  . cholecalciferol (VITAMIN D) tablet 2,000 Units  2,000 Units Oral BID Tarak V. Allena Katz, MD   2,000 Units at 01/06/12 [phone]  . Indacaterol Maleate CAPS 1 capsule  1 capsule Inhalation Daily Tarak V. Allena Katz, MD      . misoprostol (CYTOTEC) tablet 100 mcg  100 mcg Oral BID Tarak V. Allena Katz, MD   100 mcg at 01/06/12 0947  . pantoprazole (PROTONIX) injection 40 mg  40 mg Intravenous Q12H Tarak V. Allena Katz, MD   40 mg at 01/06/12 0948  . PARoxetine (PAXIL) tablet 20 mg  20 mg Oral Daily Tarak V. Allena Katz, MD   20 mg at 01/06/12 0947    Allergies as of 01/05/2012  . (No Known Allergies)    Review of Systems:    All systems reviewed and negative except where noted in HPI.  PHYSICAL EXAM: Vital signs in last 24 hours: Temp:  [97.3 F (36.3 C)-98.2 F (36.8 C)] 97.8 F (36.6 C) (12/16 0507) Pulse Rate:  [74-110] 94  (12/16 0507) Resp:  [17-21] 20  (12/16 0507) BP: (117-151)/(66-86) 151/86 mmHg (12/16 0507) SpO2:  [91 %-95 %] 92 % (12/16 0507) Weight:  [252 lb 14.4 oz (114.715 kg)] 252 lb 14.4 oz (114.715 kg) (12/15 1915) Last BM Date: 01/06/12 General:   Pleasant white male in NAD Head:  Normocephalic and atraumatic. Eyes:   No icterus.   Conjunctiva pink. Ears:  Normal auditory acuity. Neck:  Supple; no masses felt Lungs:  Decreased breath sounds at bases.  Heart:  Regular rate and rhythm Abdomen:  Soft, obese, normal bowel sounds. No appreciable masses or hepatomegaly.  Rectal:  No external hemorrhoids or other external lesions. On DRE there was then blood-tinged fluid in the vault   Msk:  Symmetrical without gross deformities.   Extremities:  Without edema. Neurologic:  Alert and  oriented x4;  grossly normal neurologically. Skin:  Intact without significant lesions or rashes. Cervical Nodes:  No significant cervical adenopathy. Psych:  Alert and cooperative. Normal affect.  LAB RESULTS:  Basename 01/06/12 0508 01/05/12 2130 01/05/12 1342  WBC 10.7* -- 7.8  HGB 15.9 14.9 15.9  HCT 47.1 45.5 48.0  PLT 170 -- 176   BMET  Basename 01/06/12 0508 01/05/12 1342  NA 137 138  K 4.1 4.3  CL 104 103  CO2 21 28  GLUCOSE 87 120*  BUN 14 17  CREATININE 0.98 1.02  CALCIUM 9.4 10.0  LFT  Basename 01/06/12 0508  PROT 6.4  ALBUMIN 3.7  AST 27  ALT 34  ALKPHOS 67  BILITOT 0.5  BILIDIR --  IBILI --     PREVIOUS ENDOSCOPIES: colonoscopy 12/29/11, see HPI  IMPRESSION / PLAN:  87. 78 year old male with painless rectal bleeding over the last 3-4 days. He is status post colonoscopy with polypectomy 12/29/11, see history of present illness  Patient describes several episodes of rectal bleeding though his hemoglobin is stable at 15.9. There is blood-tinged fluid in his anal vault on digital rectal exam. No obvious hemorrhoids or fissures. He could be oozing some blood from polypectomy site (descending colon). He may need flex sig for further evaluation.  2. OSA. Patient has C-PAP at bedside.   3. Multiple medical problems (DM, hyperlipidemia, RA, osteoarthritis).      Thanks   LOS: 1 day   Willette Cluster  01/06/2012, 10:30 AM   I have taken a history, examined the patient and reviewed the chart. I agree with PG's note, impression and recommendations. Post polypectomy bleeding. Agree with bowel rest, bed rest, and avoiding ASA/NSAIDs/cox 2 meds. Flex Sig tomorrow if bleeding persists.  Meryl Dare MD [facility]

## 2012-01-06 NOTE — Progress Notes (Signed)
Pt stated that he self catheterizes at home PRN (at least 3 times a day) for urinary retention/weak bladder.  Notified NP on call, T. Claiborne Billings and received orders for pt to self cath PRN.

## 2012-01-07 ENCOUNTER — Encounter (HOSPITAL_COMMUNITY)
Admission: EM | Disposition: A | Discharge status: Home / Self Care | Payer: Self-pay | Source: Home / Self Care | Attending: Internal Medicine

## 2012-01-07 ENCOUNTER — Encounter (HOSPITAL_COMMUNITY): Payer: Self-pay | Admitting: *Deleted

## 2012-01-07 HISTORY — PX: FLEXIBLE SIGMOIDOSCOPY: SHX5431

## 2012-01-07 LAB — CBC
HCT: 45.3 % (ref 39.0–52.0)
HCT: 44.1 % (ref 39.0–52.0)
Hemoglobin: 14.9 g/dL (ref 13.0–17.0)
Hemoglobin: 14.6 g/dL (ref 13.0–17.0)
MCH: 31 pg (ref 26.0–34.0)
MCHC: 32.9 g/dL (ref 30.0–36.0)
MCV: 93.6 fL (ref 78.0–100.0)
RBC: 4.84 MIL/uL (ref 4.22–5.81)
RBC: 4.71 MIL/uL (ref 4.22–5.81)
WBC: 10.2 10*3/uL (ref 4.0–10.5)

## 2012-01-07 LAB — BASIC METABOLIC PANEL
CO2: 27 mEq/L (ref 19–32)
Chloride: 102 mEq/L (ref 96–112)
Glucose, Bld: 161 mg/dL — ABNORMAL HIGH (ref 70–99)
Potassium: 4.1 mEq/L (ref 3.5–5.1)
Sodium: 137 mEq/L (ref 135–145)

## 2012-01-07 LAB — GLUCOSE, CAPILLARY
Glucose-Capillary: 110 mg/dL — ABNORMAL HIGH (ref 70–99)
Glucose-Capillary: 104 mg/dL — ABNORMAL HIGH (ref 70–99)
Glucose-Capillary: 149 mg/dL — ABNORMAL HIGH (ref 70–99)

## 2012-01-07 LAB — MAGNESIUM: Magnesium: 1.9 mg/dL (ref 1.5–2.5)

## 2012-01-07 SURGERY — SIGMOIDOSCOPY, FLEXIBLE
Anesthesia: Moderate Sedation

## 2012-01-07 MED ORDER — MIDAZOLAM HCL 10 MG/2ML IJ SOLN
INTRAMUSCULAR | Status: AC
  Filled 2012-01-07: qty 2

## 2012-01-07 MED ORDER — FENTANYL CITRATE 0.05 MG/ML IJ SOLN
INTRAMUSCULAR | Status: AC
  Filled 2012-01-07: qty 2

## 2012-01-07 MED ORDER — SODIUM CHLORIDE 0.9 % IV SOLN: Freq: Once | INTRAVENOUS | Status: DC

## 2012-01-07 MED ORDER — MIDAZOLAM HCL 10 MG/2ML IJ SOLN
INTRAMUSCULAR | Status: DC | PRN
  Administered 2012-01-07: 2 mg via INTRAVENOUS

## 2012-01-07 MED ORDER — FENTANYL CITRATE 0.05 MG/ML IJ SOLN
INTRAMUSCULAR | Status: DC | PRN
  Administered 2012-01-07: 25 ug via INTRAVENOUS
  Administered 2012-01-07: 12.5 ug via INTRAVENOUS

## 2012-01-07 MED ORDER — SODIUM CHLORIDE 0.45 % IV SOLN: INTRAVENOUS | Status: DC

## 2012-01-07 MED ORDER — IPRATROPIUM BROMIDE HFA 17 MCG/ACT IN AERS
2.0000 | INHALATION_SPRAY | Freq: Four times a day (QID) | RESPIRATORY_TRACT | Status: DC
  Administered 2012-01-07 – 2012-01-08 (×2): 2 via RESPIRATORY_TRACT
  Filled 2012-01-07: qty 12.9

## 2012-01-07 NOTE — Progress Notes (Signed)
Pt reports still having some small dark maroon color on toilet paper after wiping. This RN found small amount of fairly bright red blood on bed pad just now. Will continue to monitor. Ginny Forth

## 2012-01-07 NOTE — Progress Notes (Signed)
Patient noted to have 3 beats of vtach and 3 PVCs on the monitor.  BP 134/75, P 93.  Patient asymptomatic.  Patient was having a bowel movement at time of irregular heartbeat.  Patient is resting comfortably in the bed.  MD notified, BMP and magnesium lab draws ordered.  Pt's son is at bedside.  Will continue to monitor closely.

## 2012-01-07 NOTE — Interval H&P Note (Signed)
History and Physical Interval Note:  01/07/2012 9:29 AM  Lance Powell  has presented today for surgery, with the diagnosis of rectal bleeding  The various methods of treatment have been discussed with the patient and family. After consideration of risks, benefits and other options for treatment, the patient has consented to  Procedure(s) (LRB) with comments: FLEXIBLE SIGMOIDOSCOPY (N/A) as a surgical intervention .  The patient's history has been reviewed, patient examined, no change in status, stable for surgery.  I have reviewed the patient's chart and labs.  Questions were answered to the patient's satisfaction.     Venita Lick. Russella Dar MD [facility]

## 2012-01-07 NOTE — Care Management Note (Signed)
    Page 1 of 1   01/08/2012     11:23:26 AM   CARE MANAGEMENT NOTE 01/08/2012  Patient:  GARRETTE, CAINE   Account Number:  [id number]  Date Initiated:  01/07/2012  Documentation initiated by:  Lanier Clam  Subjective/Objective Assessment:   ADMITTED W/RECTAL BLEED.     Action/Plan:   FROM HOME.HAS PCP,PHARMACY.   Anticipated DC Date:  01/08/2012   Anticipated DC Plan:  HOME/SELF CARE      DC Planning Services  CM consult      Choice offered to / List presented to:             Status of service:  Completed, signed off Medicare Important Message given?   (If response is "NO", the following Medicare IM given date fields will be blank) Date Medicare IM given:   Date Additional Medicare IM given:    Discharge Disposition:  HOME/SELF CARE  Per UR Regulation:  Reviewed for med. necessity/level of care/duration of stay  If discussed at Long Length of Stay Meetings, dates discussed:    Comments:  01/08/12 Nuel Dejaynes RN,BSN NCM [phone] D/C HOME NO D/C NEEDS,OR ORDERS. 01/07/12 Zeyad Delaguila RN,BSN,NCM [phone] FLEX SIG TODAY,NOTED 3 BEATS VT/3 BEATS PVC.

## 2012-01-07 NOTE — Op Note (Signed)
[facility] [address] [place], [address]   FLEXIBLE SIGMOIDOSCOPY PROCEDURE REPORT  PATIENT: Lance Powell, Lance Powell.  MR#: [id number] BIRTHDATE: March 28, 1929 , 82  yrs. old GENDER: Male ENDOSCOPIST: Meryl Dare, MD, [facility] PROCEDURE DATE:  01/07/2012 PROCEDURE:   Sigmoidoscopy, diagnostic ASA CLASS:   Class III INDICATIONS:hematochezia. MEDICATIONS: medications were titrated to patient response per physician's verbal order, Fentanyl 37.5 mcg IV, and Versed 2 mg IV  DESCRIPTION OF PROCEDURE:   After the risks benefits and alternatives of the procedure were thoroughly explained, informed consent was obtained.  revealed no abnormalities of the rectum. The endoscope was introduced through the anus  and advanced to the descending colon , limited by No adverse events experienced.   The quality of the prep was inadequate .  The instrument was then slowly withdrawn as the mucosa was fully examined.    COLON FINDINGS: Severe diverticulosis was noted in the sigmoid colon and descending colon.  Retained stool and old blood in sigmoid and descending. No fresh blood or active bleeding. The polypectomy site could not be identified.  Retroflexed views revealed internal hemorrhoid.   The scope was then withdrawn from the patient and the procedure terminated.  COMPLICATIONS: There were no complications.  ENDOSCOPIC IMPRESSION: 1. Severe diverticulosis was noted in the sigmoid colon and descending colon 2. Old blood and stool in sigmoid and descending colon  RECOMMENDATIONS: 1.  Diverticular vs post polypectomy bleed-resolving 2.  No ASA/NSAIDs for 2 weeks 3.  Advance to full liquid and then low residue diet      eSigned:  Meryl Dare, MD, [facility] 01/07/2012 10:41 AM

## 2012-01-07 NOTE — Progress Notes (Signed)
[facility] PROGRESS NOTE  ALIOU MEALEY RUE:[id number] DOB: 09/29/1929 DOA: 01/05/2012 PCP: Garth Schlatter, MD  Assessment/Plan: 78 year old admitted with lower GI bleed, patient had recent colonoscopy with polypectomy on 12-06.   1-GI Bleed: Post Polypectomy Bleed. HB Q 12 hours. Flex Sig 12-17 show : 1. Diverticular vs post polypectomy bleed-resolving  2. No ASA/NSAIDs for 2 weeks  Repeat Hb in am.  2-DM: Hold metformin while inpatient.  3-HL: Continue with Lipitor.  4-OSA: CIPAP.  5-Arthritis:No NSAID. He will probably need prescription for pain medication (tramadol).  6-PVC: asymptomatic: Check B-met, Mg level.   Code Status: Full  Family Communication: Care discussed with patient.  Disposition Plan: Home when medical stable.   Consultants:  GI, [facility].  Procedures:  None.  Antibiotics:  None.  HPI/Subjective: Had small bloody stool after he came from sigmoidoscopy, no abdominal pain,. Tolerates clears today.   Objective: Filed Vitals:   01/07/12 1025 01/07/12 1030 01/07/12 1129 01/07/12 1410  BP: 125/57 112/62 134/75 116/72  Pulse:    87  Temp:    98 F (36.7 C)  TempSrc:    Oral  Resp: 18 22  18   Height:      Weight:      SpO2: 90% 91%  93%    Intake/Output Summary (Last 24 hours) at 01/07/12 1821 Last data filed at 01/07/12 1811  Gross per 24 hour  Intake   1025 ml  Output    550 ml  Net    475 ml   Filed Weights   01/05/12 1915 01/07/12 0914  Weight: 114.715 kg (252 lb 14.4 oz) 114.306 kg (252 lb)    Exam:   General:  No distress.  Cardiovascular: S 1, S 2 RRR  Respiratory: CTA  Abdomen: Bs present, soft, NT  Data Reviewed: Basic Metabolic Panel:  Lab 01/07/12 1478 01/06/12 0508 01/05/12 1342  NA 137 137 138  K 4.1 4.1 4.3  CL 102 104 103  CO2 27 21 28   GLUCOSE 161* 87 120*  BUN 11 14 17   CREATININE 1.13 0.98 1.02  CALCIUM 9.7 9.4 10.0  MG 1.9 -- --  PHOS -- -- --   Liver Function Tests:  Lab 01/06/12 0508  01/05/12 1342  AST 27 25  ALT 34 34  ALKPHOS 67 69  BILITOT 0.5 0.4  PROT 6.4 6.5  ALBUMIN 3.7 3.6   CBC:  Lab 01/07/12 1650 01/07/12 0435 01/06/12 1400 01/06/12 0508 01/05/12 2130 01/05/12 1342  WBC 8.3 10.2 7.5 10.7* -- 7.8  NEUTROABS -- -- -- 5.6 -- 4.4  HGB 14.6 14.9 14.9 15.9 14.9 --  HCT 44.1 45.3 45.3 47.1 45.5 --  MCV 93.6 93.6 93.0 94.0 -- 93.2  PLT 170 174 179 170 -- 176   Cardiac Enzymes: No results found for this basename: CKTOTAL:5,CKMB:5,CKMBINDEX:5,TROPONINI:5 in the last 168 hours BNP (last 3 results) No results found for this basename: PROBNP:3 in the last 8760 hours CBG:  Lab 01/07/12 1727 01/07/12 1119 01/07/12 0956 01/07/12 0750 01/06/12 2302  GLUCAP 104* 110* 111* 132* 91    No results found for this or any previous visit (from the past 240 hour(s)).   Studies: No results found.  Scheduled Meds:   . sodium chloride   Intravenous Once  . atorvastatin  40 mg Oral q morning - 10a  . cholecalciferol  2,000 Units Oral BID  . Indacaterol Maleate  1 capsule Inhalation Daily  . insulin aspart  0-9 Units Subcutaneous TID WC  .  misoprostol  100 mcg Oral BID  . pantoprazole (PROTONIX) IV  40 mg Intravenous Q12H  . PARoxetine  20 mg Oral Daily   Continuous Infusions:   . sodium chloride      Principal Problem:  *GI bleed Active Problems:  DM  HYPERLIPIDEMIA  OBSTRUCTIVE SLEEP APNEA  ARTHRITIS, RHEUMATOID  Personal history of colonic polyps    Time spent: 25 minutes.     Gwendolyn Mclees  [facility] Pager [phone]. If 8PM-8AM, please contact night-coverage at [email or web address], password [facility] 01/07/2012, 6:21 PM  LOS: 2 days

## 2012-01-08 ENCOUNTER — Encounter (HOSPITAL_COMMUNITY): Payer: Self-pay | Admitting: Gastroenterology

## 2012-01-08 DIAGNOSIS — M1990 Unspecified osteoarthritis, unspecified site: Secondary | ICD-10-CM

## 2012-01-08 LAB — CBC
HCT: 45.7 % (ref 39.0–52.0)
Hemoglobin: 15 g/dL (ref 13.0–17.0)
RDW: 14.3 % (ref 11.5–15.5)
WBC: 10 10*3/uL (ref 4.0–10.5)

## 2012-01-08 LAB — GLUCOSE, CAPILLARY: Glucose-Capillary: 129 mg/dL — ABNORMAL HIGH (ref 70–99)

## 2012-01-08 MED ORDER — CELECOXIB 200 MG PO CAPS: 200.0000 mg | ORAL_CAPSULE | Freq: Every evening | ORAL | Status: DC

## 2012-01-08 MED ORDER — TRAMADOL HCL 50 MG PO TABS: 50.0000 mg | ORAL_TABLET | Freq: Three times a day (TID) | ORAL | Status: DC | PRN

## 2012-01-08 MED ORDER — ASPIRIN 81 MG PO TABS: 81.0000 mg | ORAL_TABLET | Freq: Every morning | ORAL | Status: DC

## 2012-01-08 MED ORDER — PANTOPRAZOLE SODIUM 40 MG PO TBEC: 40.0000 mg | DELAYED_RELEASE_TABLET | Freq: Every day | ORAL | Status: DC

## 2012-01-08 NOTE — Progress Notes (Signed)
Patient found asleep on home CPAP machine. RT talked to RN who is aware that an order is needed. RT called service response to come check machine. RT will give report to day shift therapist to follow up.

## 2012-01-08 NOTE — Progress Notes (Addendum)
[facility] Progress Note  SUBJECTIVE: Feels fine, no further bleeding.   OBJECTIVE:  Vital signs in last 24 hours: Temp:  [97.6 F (36.4 C)-98.1 F (36.7 C)] 97.7 F (36.5 C) (12/18 0500) Pulse Rate:  [86-92] 91  (12/18 0500) Resp:  [17-27] 18  (12/17 2131) BP: (112-148)/(57-84) 141/70 mmHg (12/18 0500) SpO2:  [89 %-93 %] 93 % (12/18 0801) Weight:  [252 lb (114.306 kg)] 252 lb (114.306 kg) (12/17 0914) Last BM Date: 01/07/12 General:    white male in NAD Heart:  Regular rate and rhythm Lungs: Respirations even and unlabored, lungs CTA bilaterally Abdomen:  Soft, nontender and nondistended. Normal bowel sounds. Extremities:  Without edema. Neurologic:  Alert and oriented,  grossly normal neurologically. Psych:  Cooperative. Normal mood and affect.  Lab Results:  Basename 01/08/12 0505 01/07/12 1650 01/07/12 0435  WBC 10.0 8.3 10.2  HGB 15.0 14.6 14.9  HCT 45.7 44.1 45.3  PLT 172 170 174   BMET  Basename 01/07/12 1235 01/06/12 0508 01/05/12 1342  NA 137 137 138  K 4.1 4.1 4.3  CL 102 104 103  CO2 27 21 28   GLUCOSE 161* 87 120*  BUN 11 14 17   CREATININE 1.13 0.98 1.02  CALCIUM 9.7 9.4 10.0   LFT  Basename 01/06/12 0508  PROT 6.4  ALBUMIN 3.7  AST 27  ALT 34  ALKPHOS 67  BILITOT 0.5  BILIDIR --  IBILI --     ASSESSMENT / PLAN:  77 year old male with painless rectal bleeding.  Flex sig yesterday negative for active bleeding. There was old blood in sigmoid and descending and severe diverticular disease. Bleeding may have been low volume diverticular hemorrhage vrs polypectomy bleeding. No further bleeding, hgb 15.0. Diet advanced. Stable for discharge from GI standpoint.     LOS: 3 days   Willette Cluster  01/08/2012, 9:12 AM   I have taken an interval history, reviewed the chart and examined the patient. I agree with the Advanced Practitioner's note, impression and recommendations. No ASA/NSAIDs and low residue diet for 2 weeks. Follow up  with Dr. Melvia Heaps as needed. OK for discharge today.  Venita Lick. Russella Dar MD [facility]

## 2012-01-08 NOTE — Discharge Summary (Signed)
Physician Discharge Summary  Lance Powell ZOX:[id number] DOB: 11-03-1929 DOA: 01/05/2012  PCP: Lance Schlatter, MD  Admit date: 01/05/2012 Discharge date: 01/08/2012  Time spent: >30 minutes  Recommendations for Outpatient Follow-up:  -Keep yourself well hydrated -Take medications as prescribed -Follow a low residue diet -Increase fiber in your diet      -Arrange follow up with PCP in 2 weeks (will need CBC to follow Hgb during next appointment)  Discharge Diagnoses:  Principal Problem:  *GI bleed Active Problems:  DM  HYPERLIPIDEMIA  OBSTRUCTIVE SLEEP APNEA  ARTHRITIS, RHEUMATOID  Personal history of colonic polyps   Discharge Condition: stable and improved. No further GI bleeding. Patient will follow with PCP in 2 weeks. Instructed to keep himself hydrated and to take medications as prescribed. GI will inform him about biopsy results from polypectomy.   Diet recommendation: Low residue and high fiber diet.  Filed Weights   01/05/12 1915 01/07/12 0914  Weight: 114.715 kg (252 lb 14.4 oz) 114.306 kg (252 lb)    History of present illness:  Lance Powell is a 78 y.o. male with pmh of HLD, OSA, COPD, arthritis and DM; came to ED 2/2 rectal bleeding. Pt had colonoscopy with polypectomy on 12/06; nine days later, he had 3 episodes of BRBPR. Largest amount was cup full which triggered him to come to ED. He denies any other complaints.  Hospital Course:  1-GI Bleed: Post Polypectomy Bleed. Hgb remains stable during hospitalization and patient do not require transfusion. HB Q 12 hours. Flex Sig 12-17 show : Diverticular vs post polypectomy bleed-resolving. Per GI ok to be discharge. No further bleeding since sigmoidoscopy. Advise to follow high fiber diet and also low residue diet. No ASA/NSAIDs for 1 week; continue PPI.   2-DM: Stable. Will resume metformin and patient will follow with PCP at discharge.   3-HL: Continue with Lipitor.   4-OSA:Continue CIPAP.    5-Arthritis: No NSAID's for 1 week. Will give prescription for tramadol to use PRN for pain.    Procedures: Sigmoidoscopy (check GI procedure note for results detail)  Consultations:  GI  Discharge Exam: Filed Vitals:   01/07/12 1410 01/07/12 2131 01/08/12 0500 01/08/12 0801  BP: 116/72 127/64 141/70   Pulse: 87 88 91   Temp: 98 F (36.7 C) 98.1 F (36.7 C) 97.7 F (36.5 C)   TempSrc: Oral Oral Oral   Resp: 18 18    Height:      Weight:      SpO2: 93% 92% 93% 93%    General: No distress.  Cardiovascular: S 1, S 2 RRR  Respiratory: CTA  Abdomen: Bs present, soft, NT  Discharge Instructions  Discharge Orders    Future Appointments: Provider: Department: Dept Phone: Center:   04/17/2012 1:30 PM Lance Share, MD [facility] [phone] None     Future Orders Please Complete By Expires   Discharge instructions      Comments:   -Keep yourself well hydrated -Take medications as prescribed -Follow a low residue diet -Increase fiber in your diet -Arrange follow up with PCP in 2 weeks       Medication List     As of 01/08/2012 10:49 AM    TAKE these medications         albuterol 108 (90 BASE) MCG/ACT inhaler   Commonly known as: PROVENTIL HFA;VENTOLIN HFA   Inhale 2 puffs into the lungs every 6 (six) hours as needed for wheezing or shortness of breath.  aspirin 81 MG tablet   Take 1 tablet (81 mg total) by mouth every morning. Stop for 1 week and then resume      atorvastatin 40 MG tablet   Commonly known as: LIPITOR   Take 40 mg by mouth every morning.      celecoxib 200 MG capsule   Commonly known as: CELEBREX   Take 1 capsule (200 mg total) by mouth every evening. Hold for the next 5 days and then resume      CENTRUM tablet   Take 1 tablet by mouth daily.      cholecalciferol 1000 UNITS tablet   Commonly known as: VITAMIN D   Take 2,000 Units by mouth 2 (two) times daily.      clotrimazole-betamethasone lotion   Commonly known  as: LOTRISONE   Apply topically 2 (two) times daily.      desonide 0.05 % lotion   Commonly known as: DESOWEN      Indacaterol Maleate 75 MCG Caps   Place 1 capsule into inhaler and inhale every morning.      ketoconazole 2 % shampoo   Commonly known as: NIZORAL      metFORMIN 850 MG tablet   Commonly known as: GLUCOPHAGE   Take 850 mg by mouth every evening.      misoprostol 100 MCG tablet   Commonly known as: CYTOTEC   Take 100 mcg by mouth 2 (two) times daily.      pantoprazole 40 MG tablet   Commonly known as: PROTONIX   Take 1 tablet (40 mg total) by mouth daily.      PARoxetine 20 MG tablet   Commonly known as: PAXIL   Take 20 mg by mouth every morning.      traMADol 50 MG tablet   Commonly known as: ULTRAM   Take 1 tablet (50 mg total) by mouth every 8 (eight) hours as needed for pain.            Follow-up Information    Follow up with [facility], Lance John, MD. Schedule an appointment as soon as possible for a visit in 2 weeks.   Contact information:   [facility] [address] [place] [address] [phone]           The results of significant diagnostics from this hospitalization (including imaging, microbiology, ancillary and laboratory) are listed below for reference.     Labs: Basic Metabolic Panel:  Lab 01/07/12 9147 01/06/12 0508 01/05/12 1342  NA 137 137 138  K 4.1 4.1 4.3  CL 102 104 103  CO2 27 21 28   GLUCOSE 161* 87 120*  BUN 11 14 17   CREATININE 1.13 0.98 1.02  CALCIUM 9.7 9.4 10.0  MG 1.9 -- --  PHOS -- -- --   Liver Function Tests:  Lab 01/06/12 0508 01/05/12 1342  AST 27 25  ALT 34 34  ALKPHOS 67 69  BILITOT 0.5 0.4  PROT 6.4 6.5  ALBUMIN 3.7 3.6   CBC:  Lab 01/08/12 0505 01/07/12 1650 01/07/12 0435 01/06/12 1400 01/06/12 0508 01/05/12 1342  WBC 10.0 8.3 10.2 7.5 10.7* --  NEUTROABS -- -- -- -- 5.6 4.4  HGB 15.0 14.6 14.9 14.9 15.9 --  HCT 45.7 44.1 45.3 45.3 47.1 --  MCV 94.2 93.6  93.6 93.0 94.0 --  PLT 172 170 174 179 170 --   CBG:  Lab 01/08/12 0745 01/07/12 2141 01/07/12 1727 01/07/12 1119 01/07/12 0956  GLUCAP 129*  149* 104* 110* 111*      Signed:  Vita Powell  [facility] 01/08/2012, 10:38 AM

## 2012-03-02 ENCOUNTER — Ambulatory Visit (INDEPENDENT_AMBULATORY_CARE_PROVIDER_SITE_OTHER): Payer: Medicare Other | Admitting: Gastroenterology

## 2012-03-02 ENCOUNTER — Encounter: Payer: Self-pay | Admitting: Gastroenterology

## 2012-03-02 VITALS — BP 112/66 | HR 84 | Ht 72.0 in | Wt 261.0 lb

## 2012-03-02 DIAGNOSIS — Z860100 Personal history of colon polyps, unspecified: Secondary | ICD-10-CM

## 2012-03-02 DIAGNOSIS — K922 Gastrointestinal hemorrhage, unspecified: Secondary | ICD-10-CM

## 2012-03-02 NOTE — Progress Notes (Signed)
History of Present Illness:  The patient returns following brief hospitalization for rectal bleeding. In early December he underwent colonoscopy and removal of an inflammatory polyp from the right colon.  11 days later he developed hematochezia and was briefly hospitalized. There was a very slight drop in hemoglobin. Sigmoidoscopy only demonstrated diverticulosis. He's had no further bleeding or other GI complaints since that admission.    Review of Systems: Pertinent positive and negative review of systems were noted in the above HPI section. All other review of systems were otherwise negative.    Current Medications, Allergies, Past Medical History, Past Surgical History, Family History and Social History were reviewed in [employer] electronic medical record  Vital signs were reviewed in today's medical record. Physical Exam: General: Well developed , well nourished, no acute distress

## 2012-03-02 NOTE — Assessment & Plan Note (Signed)
Limited bleeding from either a post polypectomy bleed or from diverticulosis.

## 2012-03-02 NOTE — Patient Instructions (Addendum)
Follow up as needed

## 2012-03-02 NOTE — Assessment & Plan Note (Signed)
No further routine colonoscopy in view of patient's age

## 2012-04-17 ENCOUNTER — Encounter: Payer: Self-pay | Admitting: Pulmonary Disease

## 2012-04-17 ENCOUNTER — Ambulatory Visit (INDEPENDENT_AMBULATORY_CARE_PROVIDER_SITE_OTHER): Payer: Medicare Other | Admitting: Pulmonary Disease

## 2012-04-17 VITALS — BP 116/64 | HR 110 | Temp 98.3°F | Ht 72.0 in | Wt 254.8 lb

## 2012-04-17 DIAGNOSIS — G4733 Obstructive sleep apnea (adult) (pediatric): Secondary | ICD-10-CM

## 2012-04-17 DIAGNOSIS — J449 Chronic obstructive pulmonary disease, unspecified: Secondary | ICD-10-CM

## 2012-04-17 NOTE — Assessment & Plan Note (Signed)
The patient is doing well on arcapta with albuterol as needed.  He has had one acute exacerbation since his last visit, but now is back to his usual baseline.  I have asked him to stay on his medications, and to work on weight loss and conditioning.

## 2012-04-17 NOTE — Assessment & Plan Note (Signed)
The pt is doing well on cpap, and I have asked him to continue.  He is to also work more aggressively on cpap.

## 2012-04-17 NOTE — Patient Instructions (Addendum)
Stay on current breathing medications. Try and get more active, and work on weight loss Stay on cpap, and keep up with mask changes and supplies followup with me in 6mos.

## 2012-04-17 NOTE — Progress Notes (Signed)
  Subjective:    Patient ID: Lance Powell, male    DOB: 05-04-1929, 79 y.o.   MRN: [id number]  HPI The patient comes in today for followup of his known COPD and obstructive sleep apnea.  He is wearing CPAP compliantly biased him, and feels that he is sleeping well with excellent daytime alertness.  He has been staying on his maintenance bronchodilator, but did have an episode of an acute exacerbation a few months back.  He is now back to his usual baseline.  He denies any chest congestion, cough, or purulent mucus production currently.  His dyspnea on exertion is at his normal baseline, and I have asked him to try and push exercise as much as he can.  He is unable to attend pulmonary rehabilitation because he has to care for his ailing wife.   Review of Systems  Constitutional: Negative for fever and unexpected weight change.  HENT: Negative for ear pain, nosebleeds, congestion, sore throat, rhinorrhea, sneezing, trouble swallowing, dental problem, postnasal drip and sinus pressure.   Eyes: Negative for redness and itching.  Respiratory: Positive for cough ( mucus production--white/gray). Negative for chest tightness, shortness of breath and wheezing.   Cardiovascular: Negative for palpitations and leg swelling.  Gastrointestinal: Negative for nausea and vomiting.  Genitourinary: Negative for dysuria.       Pt has to use catheter to empty bladder  Musculoskeletal: Negative for joint swelling.  Skin: Negative for rash.  Neurological: Negative for headaches.  Hematological: Does not bruise/bleed easily.  Psychiatric/Behavioral: Negative for dysphoric mood. The patient is not nervous/anxious.        Objective:   Physical Exam Overweight male in no acute distress No skin breakdown or pressure necrosis from the CPAP mask Nose without purulent discharge noted Chest with totally clear breath sounds, no wheezing Cardiac exam regular rate and rhythm Lower extremities with no significant  edema, no cyanosis Alert and oriented, moves all 4 extremities. Does not appear sleepy       Assessment & Plan:

## 2012-06-27 DIAGNOSIS — R5383 Other fatigue: Secondary | ICD-10-CM | POA: Insufficient documentation

## 2012-06-27 DIAGNOSIS — F528 Other sexual dysfunction not due to a substance or known physiological condition: Secondary | ICD-10-CM | POA: Insufficient documentation

## 2012-06-27 DIAGNOSIS — L919 Hypertrophic disorder of the skin, unspecified: Secondary | ICD-10-CM

## 2012-06-27 DIAGNOSIS — N390 Urinary tract infection, site not specified: Secondary | ICD-10-CM | POA: Insufficient documentation

## 2012-06-27 DIAGNOSIS — R350 Frequency of micturition: Secondary | ICD-10-CM | POA: Insufficient documentation

## 2012-06-27 DIAGNOSIS — F329 Major depressive disorder, single episode, unspecified: Secondary | ICD-10-CM | POA: Insufficient documentation

## 2012-06-27 DIAGNOSIS — L909 Atrophic disorder of skin, unspecified: Secondary | ICD-10-CM | POA: Insufficient documentation

## 2012-06-27 DIAGNOSIS — J309 Allergic rhinitis, unspecified: Secondary | ICD-10-CM | POA: Insufficient documentation

## 2012-06-27 DIAGNOSIS — R5381 Other malaise: Secondary | ICD-10-CM | POA: Insufficient documentation

## 2012-10-16 ENCOUNTER — Ambulatory Visit: Payer: Medicare Other | Admitting: Pulmonary Disease

## 2012-10-19 ENCOUNTER — Ambulatory Visit (INDEPENDENT_AMBULATORY_CARE_PROVIDER_SITE_OTHER): Payer: Medicare Other | Admitting: Pulmonary Disease

## 2012-10-19 ENCOUNTER — Encounter: Payer: Self-pay | Admitting: Pulmonary Disease

## 2012-10-19 VITALS — BP 110/64 | HR 107 | Temp 98.0°F | Ht 72.0 in | Wt 261.2 lb

## 2012-10-19 DIAGNOSIS — Z23 Encounter for immunization: Secondary | ICD-10-CM

## 2012-10-19 DIAGNOSIS — G4733 Obstructive sleep apnea (adult) (pediatric): Secondary | ICD-10-CM

## 2012-10-19 DIAGNOSIS — J449 Chronic obstructive pulmonary disease, unspecified: Secondary | ICD-10-CM

## 2012-10-19 NOTE — Assessment & Plan Note (Signed)
The patient is doing fairly well from a pulmonary standpoint, but I would like to get him on some type of maintenance bronchodilator that he can afford.  I will give him samples of anoro, and have written down the various other options that he can check with his insurance company to see which has the lowest co-pay.

## 2012-10-19 NOTE — Assessment & Plan Note (Signed)
The patient is doing very well with CPAP, and is having no issues with his pressure.  He is overdue for a mask replacement, and I will send an order to his DME to work on this.  I have also encouraged him to work aggressively on weight loss.

## 2012-10-19 NOTE — Progress Notes (Signed)
  Subjective:    Patient ID: Lance Powell, male    DOB: 29-Dec-1929, 79 y.o.   MRN: [id number]  HPI The patient comes in today for followup of his known COPD and obstructive sleep apnea.  He has not been using his arcapta compliantly, because it is very expensive on his insurance plan.  He is asking about alternatives.  Despite this, he rarely uses his rescue inhaler, and feels that his breathing is adequate.  He is wearing his CPAP compliantly by his download with excellent control of his sleep apnea.  However, he is overdue for a mask change and supplies.   Review of Systems  Constitutional: Negative for fever and unexpected weight change.  HENT: Negative for ear pain, nosebleeds, congestion, sore throat, rhinorrhea, sneezing, trouble swallowing, dental problem, postnasal drip and sinus pressure.   Eyes: Negative for redness and itching.  Respiratory: Negative for cough, chest tightness, shortness of breath and wheezing.   Cardiovascular: Negative for palpitations and leg swelling.  Gastrointestinal: Negative for nausea and vomiting.  Genitourinary: Negative for dysuria.  Musculoskeletal: Negative for joint swelling.  Skin: Negative for rash.  Neurological: Negative for headaches.  Hematological: Does not bruise/bleed easily.  Psychiatric/Behavioral: Negative for dysphoric mood. The patient is not nervous/anxious.        Objective:   Physical Exam Obese male in no acute distress Nose without purulent discharge noted No skin breakdown or pressure necrosis from the CPAP mask Neck without lymphadenopathy or thyromegaly Chest fairly clear to auscultation, no wheezing or rhonchi Cardiac exam with regular rate and rhythm Lower extremities with mild edema, no cyanosis Alert and oriented, does not appear to be sleepy, moves all 4 extremities.        Assessment & Plan:

## 2012-10-19 NOTE — Patient Instructions (Addendum)
Stop arcapta for now Trial of anoro one inhalation each am everyday.  Let me know if you would like to stay on this. Will send an order to your home care company to get new mask and supplies. Will give you a flu shot today. Work on weight loss Can try zyrtec 10mg  otc each night at bedtime as needed for postnasal drip/hoarseness. followup with me in 6mos if doing well.

## 2012-10-30 DIAGNOSIS — J0100 Acute maxillary sinusitis, unspecified: Secondary | ICD-10-CM | POA: Insufficient documentation

## 2013-02-05 DIAGNOSIS — N312 Flaccid neuropathic bladder, not elsewhere classified: Secondary | ICD-10-CM

## 2013-02-05 HISTORY — DX: Flaccid neuropathic bladder, not elsewhere classified: N31.2

## 2013-02-17 DIAGNOSIS — R05 Cough: Secondary | ICD-10-CM | POA: Insufficient documentation

## 2013-02-17 DIAGNOSIS — R059 Cough, unspecified: Secondary | ICD-10-CM | POA: Insufficient documentation

## 2013-04-19 ENCOUNTER — Encounter: Payer: Self-pay | Admitting: Pulmonary Disease

## 2013-04-19 ENCOUNTER — Ambulatory Visit (INDEPENDENT_AMBULATORY_CARE_PROVIDER_SITE_OTHER): Payer: Medicare Other | Admitting: Pulmonary Disease

## 2013-04-19 VITALS — BP 122/70 | HR 111 | Temp 97.7°F | Ht 72.0 in | Wt 260.0 lb

## 2013-04-19 DIAGNOSIS — G4733 Obstructive sleep apnea (adult) (pediatric): Secondary | ICD-10-CM

## 2013-04-19 DIAGNOSIS — J449 Chronic obstructive pulmonary disease, unspecified: Secondary | ICD-10-CM

## 2013-04-19 NOTE — Assessment & Plan Note (Signed)
The patient is doing well with CPAP by his recent download, and he continues to benefit from treatment. He is having issues with his SD card, and we'll see if we can get this replaced.

## 2013-04-19 NOTE — Patient Instructions (Signed)
No change in breathing medications.   Stay as active as you can.   Continue cpap, and will send an order to your home care company to get you a new SD card. followup with me again in 81mos.

## 2013-04-19 NOTE — Assessment & Plan Note (Signed)
The patient saw no difference on anoro, and would like to just stay on arcapta with as needed albuterol. He feels that he does fairly well with this, and has not had any worsening symptoms.

## 2013-04-19 NOTE — Progress Notes (Signed)
   Subjective:    Patient ID: Lance Powell, male    DOB: 1929/12/25, 80 y.o.   MRN: [id number]  HPI The patient comes in today for followup of his COPD and obstructive sleep apnea. He is wearing CPAP compliantly, and his download shows excellent compliance with good control of his events. He feels that his breathing is doing fairly well, and did not see a difference with anoro. He is staying on arcapta. He denies any significant cough or chest congestion.   Review of Systems  Constitutional: Negative for fever and unexpected weight change.  HENT: Negative for congestion, dental problem, ear pain, nosebleeds, postnasal drip, rhinorrhea, sinus pressure, sneezing, sore throat and trouble swallowing.   Eyes: Negative for redness and itching.  Respiratory: Negative for cough, chest tightness, shortness of breath and wheezing.   Cardiovascular: Negative for palpitations and leg swelling.  Gastrointestinal: Negative for nausea and vomiting.  Genitourinary: Negative for dysuria.  Musculoskeletal: Negative for joint swelling.  Skin: Negative for rash.  Neurological: Negative for headaches.  Hematological: Does not bruise/bleed easily.  Psychiatric/Behavioral: Negative for dysphoric mood. The patient is not nervous/anxious.        Objective:   Physical Exam Obese male in no acute distress Nose without purulence or discharge noted No skin breakdown or pressure necrosis from the CPAP mask Neck without lymphadenopathy or thyromegaly Chest totally clear to auscultation, no wheezing Cardiac exam with regular rate and rhythm Lower extremities with mild edema, no cyanosis Alert and oriented, moves all 4 extremities.       Assessment & Plan:

## 2013-05-06 DIAGNOSIS — N529 Male erectile dysfunction, unspecified: Secondary | ICD-10-CM | POA: Insufficient documentation

## 2013-05-06 DIAGNOSIS — L821 Other seborrheic keratosis: Secondary | ICD-10-CM | POA: Insufficient documentation

## 2013-05-24 DIAGNOSIS — T07XXXA Unspecified multiple injuries, initial encounter: Secondary | ICD-10-CM | POA: Insufficient documentation

## 2013-07-12 DIAGNOSIS — R197 Diarrhea, unspecified: Secondary | ICD-10-CM | POA: Insufficient documentation

## 2013-10-18 ENCOUNTER — Ambulatory Visit: Payer: Medicare Other | Admitting: Pulmonary Disease

## 2013-10-19 ENCOUNTER — Ambulatory Visit: Payer: Medicare Other | Admitting: Pulmonary Disease

## 2013-11-03 ENCOUNTER — Encounter: Payer: Self-pay | Admitting: Pulmonary Disease

## 2013-11-03 ENCOUNTER — Ambulatory Visit (INDEPENDENT_AMBULATORY_CARE_PROVIDER_SITE_OTHER): Payer: Medicare Other | Admitting: Pulmonary Disease

## 2013-11-03 VITALS — BP 122/62 | HR 75 | Temp 97.4°F | Ht 72.0 in | Wt 250.8 lb

## 2013-11-03 DIAGNOSIS — Z23 Encounter for immunization: Secondary | ICD-10-CM

## 2013-11-03 DIAGNOSIS — J438 Other emphysema: Secondary | ICD-10-CM

## 2013-11-03 DIAGNOSIS — G4733 Obstructive sleep apnea (adult) (pediatric): Secondary | ICD-10-CM

## 2013-11-03 NOTE — Progress Notes (Signed)
   Subjective:    Patient ID: Lance Powell, male    DOB: [birth year], 80 y.o.   MRN: [id number]  HPI Patient comes in today for followup of his known COPD and obstructive sleep apnea. He continues to do fairly well from a breathing standpoint, but is having issues with fall allergies and postnasal drip. He has had no acute exacerbation, nor has he had worsening breathing issues on his current regimen. He is wearing CPAP compliantly, but is having an issue with his CPAP hose.  His download today shows excellent compliance and good control of his OSA.   Review of Systems  Constitutional: Negative for fever and unexpected weight change.  HENT: Positive for congestion, postnasal drip and rhinorrhea. Negative for dental problem, ear pain, nosebleeds, sinus pressure, sneezing, sore throat and trouble swallowing.   Eyes: Negative for redness and itching.  Respiratory: Positive for cough, shortness of breath and wheezing. Negative for chest tightness.   Cardiovascular: Negative for palpitations and leg swelling.  Gastrointestinal: Negative for nausea and vomiting.  Genitourinary: Negative for dysuria.  Musculoskeletal: Negative for joint swelling.  Skin: Negative for rash.  Neurological: Negative for headaches.  Hematological: Does not bruise/bleed easily.  Psychiatric/Behavioral: Negative for dysphoric mood. The patient is not nervous/anxious.        Objective:   Physical Exam Overweight male in no acute distress Nose without purulence or discharge noted Neck without lymphadenopathy or thyromegaly Chest with mildly decreased breath sounds, no wheezes or crackles Cardiac exam with regular rate and rhythm Lower extremities without edema, no cyanosis Alert and oriented, moves all 4 extremities.       Assessment & Plan:

## 2013-11-03 NOTE — Assessment & Plan Note (Signed)
The patient is doing very well on his CPAP device by his download, but is having difficulties with his CPAP hose.  We'll send an order to his home care company to take care of this.

## 2013-11-03 NOTE — Assessment & Plan Note (Signed)
The patient appears to be doing very well on his LABA alone. He has not had a recent acute exacerbation, nor does he require his rescue inhaler.  I've also encouraged him to work on aggressive weight loss and some type of conditioning program.

## 2013-11-03 NOTE — Patient Instructions (Signed)
Stay on cpap, and call your home care company about getting the hose issue solved.  I will send an order for them to work on it. No change in breathing medication Can try generic zyrtec 10mg  at bedtime to help with allergies and mucus. Will give you a flu shot today. followup with me again in 6-39mos

## 2013-11-24 DIAGNOSIS — L219 Seborrheic dermatitis, unspecified: Secondary | ICD-10-CM | POA: Insufficient documentation

## 2014-02-25 ENCOUNTER — Emergency Department (HOSPITAL_COMMUNITY): Payer: Medicare Other

## 2014-02-25 ENCOUNTER — Encounter (HOSPITAL_COMMUNITY): Payer: Self-pay | Admitting: *Deleted

## 2014-02-25 ENCOUNTER — Inpatient Hospital Stay (HOSPITAL_COMMUNITY)
Admission: EM | Admit: 2014-02-25 | Discharge: 2014-03-01 | DRG: 854 | Disposition: A | Discharge status: Home Health | Payer: Medicare Other | Attending: Internal Medicine | Admitting: Internal Medicine

## 2014-02-25 DIAGNOSIS — I10 Essential (primary) hypertension: Secondary | ICD-10-CM | POA: Diagnosis present

## 2014-02-25 DIAGNOSIS — R1031 Right lower quadrant pain: Secondary | ICD-10-CM

## 2014-02-25 DIAGNOSIS — Z79899 Other long term (current) drug therapy: Secondary | ICD-10-CM

## 2014-02-25 DIAGNOSIS — K8000 Calculus of gallbladder with acute cholecystitis without obstruction: Secondary | ICD-10-CM | POA: Diagnosis present

## 2014-02-25 DIAGNOSIS — K5660 Unspecified intestinal obstruction: Secondary | ICD-10-CM | POA: Diagnosis present

## 2014-02-25 DIAGNOSIS — R339 Retention of urine, unspecified: Secondary | ICD-10-CM | POA: Diagnosis present

## 2014-02-25 DIAGNOSIS — J438 Other emphysema: Secondary | ICD-10-CM

## 2014-02-25 DIAGNOSIS — E860 Dehydration: Secondary | ICD-10-CM | POA: Diagnosis present

## 2014-02-25 DIAGNOSIS — M069 Rheumatoid arthritis, unspecified: Secondary | ICD-10-CM | POA: Diagnosis present

## 2014-02-25 DIAGNOSIS — K819 Cholecystitis, unspecified: Secondary | ICD-10-CM | POA: Diagnosis present

## 2014-02-25 DIAGNOSIS — A419 Sepsis, unspecified organism: Secondary | ICD-10-CM | POA: Diagnosis present

## 2014-02-25 DIAGNOSIS — Z7982 Long term (current) use of aspirin: Secondary | ICD-10-CM

## 2014-02-25 DIAGNOSIS — Z87891 Personal history of nicotine dependence: Secondary | ICD-10-CM

## 2014-02-25 DIAGNOSIS — IMO0001 Reserved for inherently not codable concepts without codable children: Secondary | ICD-10-CM | POA: Diagnosis present

## 2014-02-25 DIAGNOSIS — G4733 Obstructive sleep apnea (adult) (pediatric): Secondary | ICD-10-CM | POA: Diagnosis present

## 2014-02-25 DIAGNOSIS — E785 Hyperlipidemia, unspecified: Secondary | ICD-10-CM | POA: Diagnosis present

## 2014-02-25 DIAGNOSIS — G8929 Other chronic pain: Secondary | ICD-10-CM

## 2014-02-25 DIAGNOSIS — K5792 Diverticulitis of intestine, part unspecified, without perforation or abscess without bleeding: Secondary | ICD-10-CM | POA: Diagnosis present

## 2014-02-25 DIAGNOSIS — J449 Chronic obstructive pulmonary disease, unspecified: Secondary | ICD-10-CM | POA: Diagnosis present

## 2014-02-25 DIAGNOSIS — K810 Acute cholecystitis: Secondary | ICD-10-CM | POA: Diagnosis present

## 2014-02-25 DIAGNOSIS — I440 Atrioventricular block, first degree: Secondary | ICD-10-CM | POA: Diagnosis present

## 2014-02-25 DIAGNOSIS — R109 Unspecified abdominal pain: Secondary | ICD-10-CM | POA: Diagnosis present

## 2014-02-25 DIAGNOSIS — E119 Type 2 diabetes mellitus without complications: Secondary | ICD-10-CM | POA: Diagnosis present

## 2014-02-25 DIAGNOSIS — K56609 Unspecified intestinal obstruction, unspecified as to partial versus complete obstruction: Secondary | ICD-10-CM | POA: Diagnosis present

## 2014-02-25 DIAGNOSIS — M1990 Unspecified osteoarthritis, unspecified site: Secondary | ICD-10-CM | POA: Diagnosis present

## 2014-02-25 DIAGNOSIS — K5901 Slow transit constipation: Secondary | ICD-10-CM | POA: Insufficient documentation

## 2014-02-25 DIAGNOSIS — K5732 Diverticulitis of large intestine without perforation or abscess without bleeding: Secondary | ICD-10-CM | POA: Diagnosis present

## 2014-02-25 LAB — GLUCOSE, CAPILLARY: Glucose-Capillary: 112 mg/dL — ABNORMAL HIGH (ref 70–99)

## 2014-02-25 LAB — COMPREHENSIVE METABOLIC PANEL
ALBUMIN: 3.6 g/dL (ref 3.5–5.2)
ALT: 35 U/L (ref 0–53)
ANION GAP: 7 (ref 5–15)
AST: 29 U/L (ref 0–37)
Alkaline Phosphatase: 74 U/L (ref 39–117)
BUN: 13 mg/dL (ref 6–23)
CALCIUM: 10.3 mg/dL (ref 8.4–10.5)
CHLORIDE: 100 mmol/L (ref 96–112)
CO2: 28 mmol/L (ref 19–32)
Creatinine, Ser: 1.17 mg/dL (ref 0.50–1.35)
GFR calc Af Amer: 65 mL/min — ABNORMAL LOW (ref >90)
GFR calc non Af Amer: 56 mL/min — ABNORMAL LOW (ref >90)
Glucose, Bld: 156 mg/dL — ABNORMAL HIGH (ref 70–99)
POTASSIUM: 4.6 mmol/L (ref 3.5–5.1)
SODIUM: 135 mmol/L (ref 135–145)
TOTAL PROTEIN: 7.6 g/dL (ref 6.0–8.3)
Total Bilirubin: 0.9 mg/dL (ref 0.3–1.2)

## 2014-02-25 LAB — CBC WITH DIFFERENTIAL/PLATELET
Basophils Absolute: 0 10*3/uL (ref 0.0–0.1)
Basophils Relative: 0 % (ref 0–1)
EOS PCT: 0 % (ref 0–5)
Eosinophils Absolute: 0 10*3/uL (ref 0.0–0.7)
HEMATOCRIT: 50.9 % (ref 39.0–52.0)
HEMOGLOBIN: 17.2 g/dL — AB (ref 13.0–17.0)
LYMPHS ABS: 2.6 10*3/uL (ref 0.7–4.0)
LYMPHS PCT: 16 % (ref 12–46)
MCH: 31.3 pg (ref 26.0–34.0)
MCHC: 33.8 g/dL (ref 30.0–36.0)
MCV: 92.5 fL (ref 78.0–100.0)
MONO ABS: 2 10*3/uL — AB (ref 0.1–1.0)
Monocytes Relative: 12 % (ref 3–12)
NEUTROS PCT: 72 % (ref 43–77)
Neutro Abs: 12.3 10*3/uL — ABNORMAL HIGH (ref 1.7–7.7)
Platelets: 188 10*3/uL (ref 150–400)
RBC: 5.5 MIL/uL (ref 4.22–5.81)
RDW: 14.6 % (ref 11.5–15.5)
WBC: 17 10*3/uL — AB (ref 4.0–10.5)

## 2014-02-25 LAB — URINALYSIS, ROUTINE W REFLEX MICROSCOPIC
Bilirubin Urine: NEGATIVE
Glucose, UA: 100 mg/dL — AB
Hgb urine dipstick: NEGATIVE
KETONES UR: NEGATIVE mg/dL
Leukocytes, UA: NEGATIVE
Nitrite: NEGATIVE
PH: 5.5 (ref 5.0–8.0)
PROTEIN: NEGATIVE mg/dL
Specific Gravity, Urine: 1.023 (ref 1.005–1.030)
Urobilinogen, UA: 0.2 mg/dL (ref 0.0–1.0)

## 2014-02-25 LAB — POC OCCULT BLOOD, ED: FECAL OCCULT BLD: NEGATIVE

## 2014-02-25 LAB — LACTIC ACID, PLASMA: LACTIC ACID, VENOUS: 1.2 mmol/L (ref 0.5–2.0)

## 2014-02-25 MED ORDER — SODIUM CHLORIDE 0.9 % IV BOLUS (SEPSIS)
1000.0000 mL | Freq: Once | INTRAVENOUS | Status: AC
  Administered 2014-02-25: 1000 mL via INTRAVENOUS

## 2014-02-25 MED ORDER — VANCOMYCIN HCL 10 G IV SOLR: 1250.0000 mg | INTRAVENOUS | Status: DC

## 2014-02-25 MED ORDER — IPRATROPIUM BROMIDE 0.02 % IN SOLN
0.5000 mg | Freq: Four times a day (QID) | RESPIRATORY_TRACT | Status: DC
  Administered 2014-02-25 – 2014-02-26 (×2): 0.5 mg via RESPIRATORY_TRACT
  Filled 2014-02-25 (×3): qty 2.5

## 2014-02-25 MED ORDER — PIPERACILLIN-TAZOBACTAM 3.375 G IVPB
3.3750 g | Freq: Three times a day (TID) | INTRAVENOUS | Status: DC
  Administered 2014-02-26 – 2014-03-01 (×11): 3.375 g via INTRAVENOUS
  Filled 2014-02-25 (×13): qty 50

## 2014-02-25 MED ORDER — PIPERACILLIN-TAZOBACTAM 3.375 G IVPB 30 MIN
3.3750 g | Freq: Once | INTRAVENOUS | Status: AC
  Administered 2014-02-25: 3.375 g via INTRAVENOUS
  Filled 2014-02-25: qty 50

## 2014-02-25 MED ORDER — VANCOMYCIN HCL 10 G IV SOLR
2500.0000 mg | Freq: Once | INTRAVENOUS | Status: DC
  Filled 2014-02-25: qty 2500

## 2014-02-25 MED ORDER — ONDANSETRON HCL 4 MG PO TABS: 4.0000 mg | ORAL_TABLET | Freq: Four times a day (QID) | ORAL | Status: DC | PRN

## 2014-02-25 MED ORDER — ACETAMINOPHEN 325 MG PO TABS
650.0000 mg | ORAL_TABLET | Freq: Once | ORAL | Status: AC
  Administered 2014-02-25: 650 mg via ORAL
  Filled 2014-02-25: qty 2

## 2014-02-25 MED ORDER — MORPHINE SULFATE 4 MG/ML IJ SOLN
4.0000 mg | INTRAMUSCULAR | Status: DC | PRN
  Administered 2014-02-25: 4 mg via INTRAVENOUS
  Filled 2014-02-25: qty 1

## 2014-02-25 MED ORDER — ONDANSETRON HCL 4 MG/2ML IJ SOLN: 4.0000 mg | Freq: Four times a day (QID) | INTRAMUSCULAR | Status: DC | PRN

## 2014-02-25 MED ORDER — SODIUM CHLORIDE 0.9 % IJ SOLN
3.0000 mL | Freq: Two times a day (BID) | INTRAMUSCULAR | Status: DC
  Administered 2014-02-25 – 2014-02-28 (×2): 3 mL via INTRAVENOUS

## 2014-02-25 MED ORDER — ONDANSETRON HCL 4 MG/2ML IJ SOLN
4.0000 mg | Freq: Once | INTRAMUSCULAR | Status: AC
  Administered 2014-02-25: 4 mg via INTRAVENOUS
  Filled 2014-02-25: qty 2

## 2014-02-25 MED ORDER — VANCOMYCIN HCL IN DEXTROSE 1-5 GM/200ML-% IV SOLN: 1000.0000 mg | Freq: Once | INTRAVENOUS | Status: DC

## 2014-02-25 MED ORDER — INSULIN ASPART 100 UNIT/ML ~~LOC~~ SOLN
0.0000 [IU] | SUBCUTANEOUS | Status: DC
  Administered 2014-02-26 – 2014-02-27 (×5): 1 [IU] via SUBCUTANEOUS
  Administered 2014-02-27: 2 [IU] via SUBCUTANEOUS
  Administered 2014-02-27 – 2014-02-28 (×4): 1 [IU] via SUBCUTANEOUS
  Administered 2014-03-01: 2 [IU] via SUBCUTANEOUS

## 2014-02-25 MED ORDER — METRONIDAZOLE IN NACL 5-0.79 MG/ML-% IV SOLN: 500.0000 mg | Freq: Once | INTRAVENOUS | Status: DC

## 2014-02-25 MED ORDER — DOCUSATE SODIUM 100 MG PO CAPS
100.0000 mg | ORAL_CAPSULE | Freq: Two times a day (BID) | ORAL | Status: DC
  Administered 2014-02-25 – 2014-03-01 (×6): 100 mg via ORAL
  Filled 2014-02-25 (×9): qty 1

## 2014-02-25 MED ORDER — MORPHINE SULFATE 4 MG/ML IJ SOLN
4.0000 mg | Freq: Once | INTRAMUSCULAR | Status: AC
  Administered 2014-02-25: 4 mg via INTRAVENOUS
  Filled 2014-02-25: qty 1

## 2014-02-25 MED ORDER — LEVALBUTEROL HCL 0.63 MG/3ML IN NEBU: 0.6300 mg | INHALATION_SOLUTION | RESPIRATORY_TRACT | Status: DC | PRN

## 2014-02-25 MED ORDER — SODIUM CHLORIDE 0.9 % IV SOLN
INTRAVENOUS | Status: AC
  Administered 2014-02-25: 22:00:00 via INTRAVENOUS

## 2014-02-25 MED ORDER — ACETAMINOPHEN 325 MG PO TABS: 650.0000 mg | ORAL_TABLET | Freq: Four times a day (QID) | ORAL | Status: DC | PRN

## 2014-02-25 MED ORDER — MOMETASONE FURO-FORMOTEROL FUM 100-5 MCG/ACT IN AERO
2.0000 | INHALATION_SPRAY | Freq: Two times a day (BID) | RESPIRATORY_TRACT | Status: DC
  Administered 2014-02-26 – 2014-03-01 (×6): 2 via RESPIRATORY_TRACT
  Filled 2014-02-25: qty 8.8

## 2014-02-25 MED ORDER — IOHEXOL 300 MG/ML  SOLN: 25.0000 mL | Freq: Once | INTRAMUSCULAR | Status: AC | PRN

## 2014-02-25 MED ORDER — IOHEXOL 300 MG/ML  SOLN
100.0000 mL | Freq: Once | INTRAMUSCULAR | Status: AC | PRN
  Administered 2014-02-25: 100 mL via INTRAVENOUS

## 2014-02-25 MED ORDER — ACETAMINOPHEN 650 MG RE SUPP: 650.0000 mg | Freq: Four times a day (QID) | RECTAL | Status: DC | PRN

## 2014-02-25 NOTE — Consult Note (Signed)
Reason for Consult:cholecystitis, diverticulitis Referring Physician: Dr Randon Goldsmith is an 81 y.o. male.  HPI: 81 yo morbidly obese WM with DM2, OSA on CPAP, COPD, comes in to ED c/o abdominal pain since Wednesday. States pain started in LLQ and was intermittent. Developed constipation. Pain in LLQ was gradually improving when he started having RUQ pain this am. Pain is constant. Doesn't radiate. Denies n/v/f/c at home. Normally has BM daily but no BM since Wednesday. +flatus. No similar symptoms in past per pt. Reports appetite was ok til today. Lives at home with handicap wife. No NSAID use. States he has incisional hernia - nontender. Has had prior partial cystectomy, sigmoid colectomy with LAR in 2004 by Dr Rosana Hoes. Also reports h/o nissen fundoplication by dr Lucrezia Starch years ago.   Past Medical History  Diagnosis Date  . Osteoarthritis   . Rheumatoid arthritis(714.0)   . Hyperlipemia   . Diabetes mellitus   . COPD (chronic obstructive pulmonary disease)   . Sleep apnea     cpap    Past Surgical History  Procedure Laterality Date  . Appendectomy  1969  . Nissen fundoplication    . Hernia repair  1990    right and left inguinal  . Vasectomy  1972  . Tonsillectomy  1937  . Transurethral resection of prostate  01/23/2011    Procedure: TRANSURETHRAL RESECTION OF THE PROSTATE WITH GYRUS INSTRUMENTS;  Surgeon: Molli Hazard, MD;  Location: [facility];  Service: Urology;  Laterality: N/A;  . Cystoscopy  01/23/2011    Procedure: CYSTOSCOPY;  Surgeon: Molli Hazard, MD;  Location[facility];  Service: Urology;  Laterality: N/A;  . Flexible sigmoidoscopy  01/07/2012    Procedure: FLEXIBLE SIGMOIDOSCOPY;  Surgeon: Ladene Artist, MD,FACG;  Location: [facility];  Service: Endoscopy;  Laterality: N/A;  . Sigmoidectomy  2004    colovesical fistula    Family History  Problem Relation Age of Onset  . Emphysema Brother   . Heart  disease Mother   . Cancer Daughter     breast    Social History:  reports that he quit smoking about 36 years ago. His smoking use included Cigarettes. He has a 120 pack-year smoking history. He has never used smokeless tobacco. He reports that he does not drink alcohol or use illicit drugs.  Allergies: No Known Allergies  Medications: I have reviewed the patient's current medications.  Results for orders placed or performed during the hospital encounter of 02/25/14 (from the past 48 hour(s))  Comprehensive metabolic panel     Status: Abnormal   Collection Time: 02/25/14  1:34 PM  Result Value Ref Range   Sodium 135 135 - 145 mmol/L   Potassium 4.6 3.5 - 5.1 mmol/L   Chloride 100 96 - 112 mmol/L   CO2 28 19 - 32 mmol/L   Glucose, Bld 156 (H) 70 - 99 mg/dL   BUN 13 6 - 23 mg/dL   Creatinine, Ser 1.17 0.50 - 1.35 mg/dL   Calcium 10.3 8.4 - 10.5 mg/dL   Total Protein 7.6 6.0 - 8.3 g/dL   Albumin 3.6 3.5 - 5.2 g/dL   AST 29 0 - 37 U/L   ALT 35 0 - 53 U/L   Alkaline Phosphatase 74 39 - 117 U/L   Total Bilirubin 0.9 0.3 - 1.2 mg/dL   GFR calc non Af Amer 56 (L) >90 mL/min   GFR calc Af Amer 65 (L) >90 mL/min  Comment: (NOTE) The eGFR has been calculated using the CKD EPI equation. This calculation has not been validated in all clinical situations. eGFR's persistently <90 mL/min signify possible Chronic Kidney Disease.    Anion gap 7 5 - 15  CBC with Differential     Status: Abnormal   Collection Time: 02/25/14  1:34 PM  Result Value Ref Range   WBC 17.0 (H) 4.0 - 10.5 K/uL   RBC 5.50 4.22 - 5.81 MIL/uL   Hemoglobin 17.2 (H) 13.0 - 17.0 g/dL   HCT 50.9 39.0 - 52.0 %   MCV 92.5 78.0 - 100.0 fL   MCH 31.3 26.0 - 34.0 pg   MCHC 33.8 30.0 - 36.0 g/dL   RDW 14.6 11.5 - 15.5 %   Platelets 188 150 - 400 K/uL   Neutrophils Relative % 72 43 - 77 %   Neutro Abs 12.3 (H) 1.7 - 7.7 K/uL   Lymphocytes Relative 16 12 - 46 %   Lymphs Abs 2.6 0.7 - 4.0 K/uL   Monocytes Relative 12 3  - 12 %   Monocytes Absolute 2.0 (H) 0.1 - 1.0 K/uL   Eosinophils Relative 0 0 - 5 %   Eosinophils Absolute 0.0 0.0 - 0.7 K/uL   Basophils Relative 0 0 - 1 %   Basophils Absolute 0.0 0.0 - 0.1 K/uL  Lactic acid, plasma     Status: None   Collection Time: 02/25/14  4:16 PM  Result Value Ref Range   Lactic Acid, Venous 1.2 0.5 - 2.0 mmol/L  POC occult blood, ED Provider will collect     Status: None   Collection Time: 02/25/14  4:22 PM  Result Value Ref Range   Fecal Occult Bld NEGATIVE NEGATIVE  Urinalysis, Routine w reflex microscopic     Status: Abnormal   Collection Time: 02/25/14  4:33 PM  Result Value Ref Range   Color, Urine YELLOW YELLOW   APPearance CLEAR CLEAR   Specific Gravity, Urine 1.023 1.005 - 1.030   pH 5.5 5.0 - 8.0   Glucose, UA 100 (A) NEGATIVE mg/dL   Hgb urine dipstick NEGATIVE NEGATIVE   Bilirubin Urine NEGATIVE NEGATIVE   Ketones, ur NEGATIVE NEGATIVE mg/dL   Protein, ur NEGATIVE NEGATIVE mg/dL   Urobilinogen, UA 0.2 0.0 - 1.0 mg/dL   Nitrite NEGATIVE NEGATIVE   Leukocytes, UA NEGATIVE NEGATIVE    Comment: MICROSCOPIC NOT DONE ON URINES WITH NEGATIVE PROTEIN, BLOOD, LEUKOCYTES, NITRITE, OR GLUCOSE <1000 mg/dL.    Dg Chest 2 View  02/25/2014   CLINICAL DATA:  Abdominal pain and shortness of breath. Constipation. Right-sided epigastric pain. Slight cough.  EXAM: CHEST - 2 VIEW  COMPARISON:  None.  FINDINGS: The heart size is normal. Emphysematous changes are present. No focal airspace disease is evident. Scarring at the lung bases is stable. Surgical clips are present at the GE junction.  IMPRESSION: 1. No acute cardiopulmonary disease. 2. Emphysema.   Electronically Signed   By: Lawrence Santiago M.D.   On: 02/25/2014 14:32   Ct Abdomen Pelvis W Contrast  02/25/2014   CLINICAL DATA:  Right lower quadrant pain x3 days, constipation, abdominal swelling. Prior appendectomy.  EXAM: CT ABDOMEN AND PELVIS WITH CONTRAST  TECHNIQUE: Multidetector CT imaging of the abdomen  and pelvis was performed using the standard protocol following bolus administration of intravenous contrast.  CONTRAST:  138m OMNIPAQUE IOHEXOL 300 MG/ML  SOLN  COMPARISON:  None.  FINDINGS: Lower chest:  Emphysematous changes at the lung bases.  Hepatobiliary:  Liver is notable for a nodular hepatic contour, suggesting cirrhosis. No focal hepatic lesion is seen.  2.5 cm gallstone in the gallbladder neck. Associated gallbladder wall thickening with pericholecystic fluid/inflammatory changes.  Pancreas: Within normal limits.  Spleen: Calcified splenic granulomata.  Adrenals/Urinary Tract: Adrenal glands are within normal limits.  Kidneys are within normal limits.  No hydronephrosis.  Distended bladder with trabeculations.  Stomach/Bowel: Surgical clips at the GE junction.  Stomach is unremarkable.  Stable dilated loop of small bowel leading to a ventral hernia in the anterior mid abdomen (series 3/image 54), with a decompressed loop exiting the hernia, indicating some degree of partial obstruction. However, this appearance is chronic.  Prior appendectomy.  Sigmoid diverticulosis, with mild inflammatory changes involving the proximal sigmoid colon (series 3/image 64), compatible with mild sigmoid diverticulitis. No drainable fluid collection/abscess. No free air.  Vascular/Lymphatic: Atherosclerotic calcifications of the abdominal aorta and branch vessels.  No suspicious abdominopelvic lymphadenopathy.  Reproductive: Prostate is grossly unremarkable.  Other: Postsurgical changes related to prior right inguinal hernia repair. Recurrent fat containing right inguinal hernia.  Small fat containing left inguinal hernia.  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  Musculoskeletal: Degenerative changes of the visualized thoracolumbar spine, most prominent at L4-5.  IMPRESSION: Cholelithiasis with suspected  acute cholecystitis.  Suspected mild sigmoid diverticulitis. No drainable fluid collection/abscess. No free air.  Ventral hernia in the anterior mid abdominal wall with some degree of secondary partial small bowel bowel obstruction, chronic.  Numerous additional stable ancillary findings as above.   Electronically Signed   By: Julian Hy M.D.   On: 02/25/2014 17:27    Review of Systems  Constitutional: Negative for fever, chills and weight loss.  HENT: Negative for nosebleeds.   Eyes: Negative for blurred vision.  Respiratory: Negative for shortness of breath.        +OSA on CPAP  Cardiovascular: Negative for chest pain, palpitations, orthopnea and PND.       +DOE (100 yards)  Gastrointestinal: Positive for abdominal pain and constipation. Negative for nausea, vomiting and diarrhea.  Genitourinary: Negative for dysuria and hematuria.       Decreased urine  Musculoskeletal: Negative.   Skin: Negative for itching and rash.  Neurological: Negative for dizziness, focal weakness, seizures, loss of consciousness and headaches.       Denies TIAs, amaurosis fugax  Endo/Heme/Allergies: Does not bruise/bleed easily.  Psychiatric/Behavioral: The patient is not nervous/anxious.    Blood pressure 153/73, pulse 109, temperature 98.2 F (36.8 C), temperature source Oral, resp. rate 27, weight 248 lb (112.492 kg), SpO2 91 %. Physical Exam  Vitals reviewed. Constitutional: He is oriented to person, place, and time. He appears well-developed and well-nourished. No distress.  Morbidly obese, nontoxic, not ill appearing; smiling  HENT:  Head: Normocephalic and atraumatic.  Right Ear: External ear normal.  Left Ear: External ear normal.  Eyes: Conjunctivae are normal. No scleral icterus.  Neck: Normal range of motion. Neck supple. No tracheal deviation present. No thyromegaly present.  Cardiovascular: Normal rate and normal heart sounds.   Respiratory: Effort normal and breath sounds normal. No  stridor. No respiratory distress. He has no wheezes.  GI: Soft. He exhibits no distension. There is tenderness in the right upper quadrant and left lower quadrant. There is positive Murphy's sign. There is no rigidity, no rebound and no guarding. A hernia is present. Hernia confirmed positive in  the ventral area.    Obese; soft, reducible soft ventral incisional hernia just above umbilicus, nontender; some mild TTP in LLQ. TTP in RUQ with some voluntary guarding. No RT. No peritonitis.   Musculoskeletal: He exhibits no edema or tenderness.  Lymphadenopathy:    He has no cervical adenopathy.  Neurological: He is alert and oriented to person, place, and time. He exhibits normal muscle tone.  Skin: Skin is warm and dry. No rash noted. He is not diaphoretic. No erythema. There is pallor.  Psychiatric: He has a normal mood and affect. His behavior is normal. Judgment and thought content normal.    Assessment/Plan: Acute calculous cholecystitis Sigmoid diverticulitis OSA on CPAP COPD DM2 Ventral incisional hernia HTN Mild dehydration Morbid obesity  Appears his diverticulitis is improving. Radiographically, the area of inflammation in sigmoid was small and no sign of microperf. His ventral hernia is soft, nontender, and reducible. He is having flatus and his SB doesn't appear significantly dilated. Given his RUQ TTP and CT findings this is c/w acute cholecystitis.   Agree with IV abx., bowel rest for both cholecystitis and diverticulitis IVF - has some degree of dehydration given his hemoconcentration on CBC (baseline hgb around 15) Please obtain cardiology consult for OR clearance for cholecystectomy Will not plan on addressing incisional hernia during this admission unless intraop issue.  Will check coags since liver appears nodular on CT. No recent etoh use but does report remote h/o significant drinking.  Will follow  [name]. Redmond Pulling, MD, FACS General, Bariatric, & Minimally Invasive  Surgery [facility], [place]   [facility] M 02/25/2014, 8:00 PM

## 2014-02-25 NOTE — H&P (Signed)
PCP: Lauraine Rinne, MD Anselmo Pickler at [facility] [phone]   Chief Complaint:  Abdominal pain  HPI: Lance Powell is a 81 y.o. male   has a past medical history of Osteoarthritis; Rheumatoid arthritis(714.0); Hyperlipemia; Diabetes mellitus; COPD (chronic obstructive pulmonary disease); and Sleep apnea.   Presented with  3 day hx of abdominal pain in Lower left quadrant, fever chills. Denies any nausea or vomiting. He had no appetite. No BM for the past 3 days able to pass gas. He presented to his CP and was found to be febrile and tachycardiac. Yesterday he started to have right upper quadrant pain as well. He was sent to [facility] ER. CT scan showed Cholelithiasis with suspected acute cholecystitis. Suspected mild sigmoid diverticulitis. No drainable fluid collection/abscess. No free air. Ventral hernia in the anterior mid abdominal wall with some degree of secondary partial small bowel bowel obstruction, chronic. Patient initially presented with fever, tachycardia, WBC elevated to 17. He met criteria for sepsis. Patient was hydrated and started on broad spectrum antibiotics.   Patient has chronic periumbelical hernia. CT scan showed chronic partial obstruction.  He has urinary retention and has to use I/O catheter every night to drain completely.  Patient reports hx of COPD but have not had significant limitations.   Hospitalist was called for admission for Diverticulitis, Cholecystitis and chronic partial SBO due to hernia  Review of Systems:    Pertinent positives include:  Fevers, chills, abdominal pain, urinary retention  Constitutional:  No weight loss, night sweats,  fatigue, weight loss  HEENT:  No headaches, Difficulty swallowing,Tooth/dental problems,Sore throat,  No sneezing, itching, ear ache, nasal congestion, post nasal drip,  Cardio-vascular:  No chest pain, Orthopnea, PND, anasarca, dizziness, palpitations.no Bilateral lower extremity swelling  GI:  No  heartburn, indigestion, nausea, vomiting, diarrhea, change in bowel habits, loss of appetite, melena, blood in stool, hematemesis Resp:  no shortness of breath at rest. No dyspnea on exertion, No excess mucus, no productive cough, No non-productive cough, No coughing up of blood.No change in color of mucus.No wheezing. Skin:  no rash or lesions. No jaundice GU:  no dysuria, change in color of urine, no urgency or frequency. No straining to urinate.  No flank pain.  Musculoskeletal:  No joint pain or no joint swelling. No decreased range of motion. No back pain.  Psych:  No change in mood or affect. No depression or anxiety. No memory loss.  Neuro: no localizing neurological complaints, no tingling, no weakness, no double vision, no gait abnormality, no slurred speech, no confusion  Otherwise ROS are negative except for above, 10 systems were reviewed  Past Medical History: Past Medical History  Diagnosis Date  . Osteoarthritis   . Rheumatoid arthritis(714.0)   . Hyperlipemia   . Diabetes mellitus   . COPD (chronic obstructive pulmonary disease)   . Sleep apnea     cpap   Past Surgical History  Procedure Laterality Date  . Appendectomy  1969  . Nissen fundoplication    . Hernia repair  1990    right and left inguinal  . Vasectomy  1972  . Tonsillectomy  1937  . Transurethral resection of prostate  01/23/2011    Procedure: TRANSURETHRAL RESECTION OF THE PROSTATE WITH GYRUS INSTRUMENTS;  Surgeon: Molli Hazard, MD;  Location: [facility];  Service: Urology;  Laterality: N/A;  . Cystoscopy  01/23/2011    Procedure: CYSTOSCOPY;  Surgeon: Molli Hazard, MD;  Location: [facility];  Service: Urology;  Laterality: N/A;  . Flexible sigmoidoscopy  01/07/2012    Procedure: FLEXIBLE SIGMOIDOSCOPY;  Surgeon: Ladene Artist, MD,FACG;  Location: [facility];  Service: Endoscopy;  Laterality: N/A;     Medications: Prior to Admission medications    Medication Sig Start Date End Date Taking? Authorizing Provider  albuterol (PROAIR HFA) 108 (90 BASE) MCG/ACT inhaler Inhale 2 puffs into the lungs every 6 (six) hours as needed for wheezing or shortness of breath. 02/25/11 02/25/14 Yes Tammy S Parrett, NP  aspirin 81 MG tablet Take 1 tablet (81 mg total) by mouth every morning. Stop for 1 week and then resume 01/08/12  Yes Barton Dubois, MD  atorvastatin (LIPITOR) 40 MG tablet Take 40 mg by mouth every morning.    Yes Historical Provider, MD  celecoxib (CELEBREX) 200 MG capsule Take 1 capsule (200 mg total) by mouth every evening. Hold for the next 5 days and then resume 01/08/12  Yes Barton Dubois, MD  cholecalciferol (VITAMIN D) 1000 UNITS tablet Take 2,000 Units by mouth 2 (two) times daily.    Yes Historical Provider, MD  enalapril (VASOTEC) 2.5 MG tablet Take 2.5 mg by mouth daily. 02/15/14  Yes Historical Provider, MD  Indacaterol Maleate (ARCAPTA NEOHALER) 75 MCG CAPS Place 1 capsule into inhaler and inhale every morning. 01/01/12  Yes Kathee Delton, MD  metFORMIN (GLUCOPHAGE) 500 MG tablet Take 500 mg by mouth 3 (three) times daily. 02/15/14  Yes Historical Provider, MD  misoprostol (CYTOTEC) 100 MCG tablet Take 100-200 mcg by mouth 4 (four) times daily. Takes 1 tab in am, 1 tab in pm, 2 tabs at bedtime   Yes Historical Provider, MD  Multiple Vitamins-Minerals (CENTRUM) tablet Take 1 tablet by mouth daily.     Yes Historical Provider, MD  pantoprazole (PROTONIX) 40 MG tablet Take 1 tablet (40 mg total) by mouth daily. 01/08/12  Yes Barton Dubois, MD  PARoxetine (PAXIL) 20 MG tablet Take 20 mg by mouth every morning.     Yes Historical Provider, MD  vitamin C (ASCORBIC ACID) 500 MG tablet Take 500 mg by mouth daily.   Yes Historical Provider, MD  traMADol (ULTRAM) 50 MG tablet Take 1 tablet (50 mg total) by mouth every 8 (eight) hours as needed for pain. 01/08/12   Barton Dubois, MD    Allergies:  No Known Allergies  Social  History:  Ambulatory   independently   Lives at home   With family     reports that he quit smoking about 36 years ago. His smoking use included Cigarettes. He has a 120 pack-year smoking history. He has never used smokeless tobacco. He reports that he does not drink alcohol or use illicit drugs.    Family History: family history includes Cancer in his daughter; Emphysema in his brother; Heart disease in his mother.    Physical Exam: Patient Vitals for the past 24 hrs:  BP Temp Temp src Pulse Resp SpO2 Weight  02/25/14 1808 - 98.2 F (36.8 C) Oral - - - -  02/25/14 1800 110/72 mmHg - - 107 18 93 % -  02/25/14 1745 116/73 mmHg - - 115 25 92 % -  02/25/14 1545 127/80 mmHg - - 111 (!) 27 92 % -  02/25/14 1515 134/85 mmHg - - 112 20 93 % -  02/25/14 1503 131/83 mmHg 100.9 F (38.3 C) Rectal 116 24 94 % -  02/25/14 1503 131/83 mmHg - - 116 (!) 27 93 % -  02/25/14 1328  115/75 mmHg 98.3 F (36.8 C) Oral (!) 124 24 91 % 112.492 kg (248 lb)    1. General:  in No Acute distress 2. Psychological: Alert and  Oriented 3. Head/ENT:    Dry Mucous Membranes                          Head Non traumatic, neck supple                          Normal  Dentition 4. SKIN:   decreased Skin turgor,  Skin clean Dry and intact no rash 5. Heart: Regular rate and rhythm no Murmur, Rub or gallop 6. Lungs: Clear to auscultation bilaterally, no wheezes or crackles   7. Abdomen: Soft, right Upper quadrant tenderness, some distended 8. Lower extremities: no clubbing, cyanosis, or edema 9. Neurologically Grossly intact, moving all 4 extremities equally 10. MSK: Normal range of motion  body mass index is 33.63 kg/(m^2).   Labs on Admission:   Results for orders placed or performed during the hospital encounter of 02/25/14 (from the past 24 hour(s))  Comprehensive metabolic panel     Status: Abnormal   Collection Time: 02/25/14  1:34 PM  Result Value Ref Range   Sodium 135 135 - 145 mmol/L   Potassium  4.6 3.5 - 5.1 mmol/L   Chloride 100 96 - 112 mmol/L   CO2 28 19 - 32 mmol/L   Glucose, Bld 156 (H) 70 - 99 mg/dL   BUN 13 6 - 23 mg/dL   Creatinine, Ser 1.17 0.50 - 1.35 mg/dL   Calcium 10.3 8.4 - 10.5 mg/dL   Total Protein 7.6 6.0 - 8.3 g/dL   Albumin 3.6 3.5 - 5.2 g/dL   AST 29 0 - 37 U/L   ALT 35 0 - 53 U/L   Alkaline Phosphatase 74 39 - 117 U/L   Total Bilirubin 0.9 0.3 - 1.2 mg/dL   GFR calc non Af Amer 56 (L) >90 mL/min   GFR calc Af Amer 65 (L) >90 mL/min   Anion gap 7 5 - 15  CBC with Differential     Status: Abnormal   Collection Time: 02/25/14  1:34 PM  Result Value Ref Range   WBC 17.0 (H) 4.0 - 10.5 K/uL   RBC 5.50 4.22 - 5.81 MIL/uL   Hemoglobin 17.2 (H) 13.0 - 17.0 g/dL   HCT 50.9 39.0 - 52.0 %   MCV 92.5 78.0 - 100.0 fL   MCH 31.3 26.0 - 34.0 pg   MCHC 33.8 30.0 - 36.0 g/dL   RDW 14.6 11.5 - 15.5 %   Platelets 188 150 - 400 K/uL   Neutrophils Relative % 72 43 - 77 %   Neutro Abs 12.3 (H) 1.7 - 7.7 K/uL   Lymphocytes Relative 16 12 - 46 %   Lymphs Abs 2.6 0.7 - 4.0 K/uL   Monocytes Relative 12 3 - 12 %   Monocytes Absolute 2.0 (H) 0.1 - 1.0 K/uL   Eosinophils Relative 0 0 - 5 %   Eosinophils Absolute 0.0 0.0 - 0.7 K/uL   Basophils Relative 0 0 - 1 %   Basophils Absolute 0.0 0.0 - 0.1 K/uL  Lactic acid, plasma     Status: None   Collection Time: 02/25/14  4:16 PM  Result Value Ref Range   Lactic Acid, Venous 1.2 0.5 - 2.0 mmol/L  POC occult blood, ED Provider will  collect     Status: None   Collection Time: 02/25/14  4:22 PM  Result Value Ref Range   Fecal Occult Bld NEGATIVE NEGATIVE  Urinalysis, Routine w reflex microscopic     Status: Abnormal   Collection Time: 02/25/14  4:33 PM  Result Value Ref Range   Color, Urine YELLOW YELLOW   APPearance CLEAR CLEAR   Specific Gravity, Urine 1.023 1.005 - 1.030   pH 5.5 5.0 - 8.0   Glucose, UA 100 (A) NEGATIVE mg/dL   Hgb urine dipstick NEGATIVE NEGATIVE   Bilirubin Urine NEGATIVE NEGATIVE   Ketones, ur  NEGATIVE NEGATIVE mg/dL   Protein, ur NEGATIVE NEGATIVE mg/dL   Urobilinogen, UA 0.2 0.0 - 1.0 mg/dL   Nitrite NEGATIVE NEGATIVE   Leukocytes, UA NEGATIVE NEGATIVE    UA glucose 100  Lab Results  Component Value Date   HGBA1C * 04/05/2008    6.8 (NOTE)   The ADA recommends the following therapeutic goal for glycemic   control related to Hgb A1C measurement:   Goal of Therapy:   < 7.0% Hgb A1C   Reference: American Diabetes Association: Clinical Practice   Recommendations 2008, Diabetes Care,  2008, 31:(Suppl 1).    Estimated Creatinine Clearance: 63.1 mL/min (by C-G formula based on Cr of 1.17).  BNP (last 3 results) No results for input(s): PROBNP in the last 8760 hours.  Other results:  I have pearsonaly reviewed this: ECG REPORT  Rate:   123  Rhythm: ST ST&T Change: no ischemic changes   Filed Weights   02/25/14 1328  Weight: 112.492 kg (248 lb)     Cultures: No results found for: SDES, SPECREQUEST, CULT, REPTSTATUS   Radiological Exams on Admission: Dg Chest 2 View  02/25/2014   CLINICAL DATA:  Abdominal pain and shortness of breath. Constipation. Right-sided epigastric pain. Slight cough.  EXAM: CHEST - 2 VIEW  COMPARISON:  None.  FINDINGS: The heart size is normal. Emphysematous changes are present. No focal airspace disease is evident. Scarring at the lung bases is stable. Surgical clips are present at the GE junction.  IMPRESSION: 1. No acute cardiopulmonary disease. 2. Emphysema.   Electronically Signed   By: Lawrence Santiago M.D.   On: 02/25/2014 14:32   Ct Abdomen Pelvis W Contrast  02/25/2014   CLINICAL DATA:  Right lower quadrant pain x3 days, constipation, abdominal swelling. Prior appendectomy.  EXAM: CT ABDOMEN AND PELVIS WITH CONTRAST  TECHNIQUE: Multidetector CT imaging of the abdomen and pelvis was performed using the standard protocol following bolus administration of intravenous contrast.  CONTRAST:  181m OMNIPAQUE IOHEXOL 300 MG/ML  SOLN  COMPARISON:   None.  FINDINGS: Lower chest:  Emphysematous changes at the lung bases.  Hepatobiliary: Liver is notable for a nodular hepatic contour, suggesting cirrhosis. No focal hepatic lesion is seen.  2.5 cm gallstone in the gallbladder neck. Associated gallbladder wall thickening with pericholecystic fluid/inflammatory changes.  Pancreas: Within normal limits.  Spleen: Calcified splenic granulomata.  Adrenals/Urinary Tract: Adrenal glands are within normal limits.  Kidneys are within normal limits.  No hydronephrosis.  Distended bladder with trabeculations.  Stomach/Bowel: Surgical clips at the GE junction.  Stomach is unremarkable.  Stable dilated loop of small bowel leading to a ventral hernia in the anterior mid abdomen (series 3/image 54), with a decompressed loop exiting the hernia, indicating some degree of partial obstruction. However, this appearance is chronic.  Prior appendectomy.  Sigmoid diverticulosis, with mild inflammatory changes involving the proximal sigmoid colon (series 3/image 64),  compatible with mild sigmoid diverticulitis. No drainable fluid collection/abscess. No free air.  Vascular/Lymphatic: Atherosclerotic calcifications of the abdominal aorta and branch vessels.  No suspicious abdominopelvic lymphadenopathy.  Reproductive: Prostate is grossly unremarkable.  Other: Postsurgical changes related to prior right inguinal hernia repair. Recurrent fat containing right inguinal hernia.  Small fat containing left inguinal hernia.  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  Musculoskeletal: Degenerative changes of the visualized thoracolumbar spine, most prominent at L4-5.  IMPRESSION: Cholelithiasis with suspected acute cholecystitis.  Suspected mild sigmoid diverticulitis. No drainable fluid collection/abscess. No free air.  Ventral hernia in the anterior mid abdominal wall with some  degree of secondary partial small bowel bowel obstruction, chronic.  Numerous additional stable ancillary findings as above.   Electronically Signed   By: Julian Hy M.D.   On: 02/25/2014 17:27    Chart has been reviewed  Assessment/Plan 81 yo M with hx of DM, HTN, COPD and hx of fundoplication here with sepsis likely due to cholecystitis and diverticulitis Present on Admission:  . Cholecystitis - surgery consult tonight, Most likely cause of sepsis given large stone, will likely need cholecystectomy. Admit for IV antibiotics . Diverticulitis - bowel rest IV zosyn . Sepsis - IV antibiotics, IVF resuscitations, blood culture pending.  Marland Kitchen COPD (chronic obstructive pulmonary disease) - not on home oxygen, continue nebulizer, add dulera . Obstructive sleep apnea - CPAP . Urinary retention - place foley in anticipation of possible surgical intervention . Hypertension - hold enalapril given low bp . SBO (small bowel obstruction) - partial chronic, likely due to hernia will defer to surgery   Prophylaxis: SCD , Protonix  CODE STATUS:  FULL CODE   Other plan as per orders.  I have spent a total of 55 min on this admission  Devoiry Corriher 02/25/2014, 7:00 PM  [facility]  Pager [phone]   after 2 AM please page floor coverage PA If 7AM-7PM, please contact the day team taking care of the patient  [email or web address]  Password [email or web address]

## 2014-02-25 NOTE — Progress Notes (Signed)
ANTIBIOTIC CONSULT NOTE - INITIAL  Pharmacy Consult for vancomycin and zosyn Indication: sepsis  No Known Allergies  Patient Measurements: Weight: 248 lb (112.492 kg)  Vital Signs: Temp: 100.9 F (38.3 C) (02/05 1503) Temp Source: Rectal (02/05 1503) BP: 127/80 mmHg (02/05 1545) Pulse Rate: 111 (02/05 1545) Intake/Output from previous day:   Intake/Output from this shift:    Labs:  Recent Labs  02/25/14 1334  WBC 17.0*  HGB 17.2*  PLT 188  CREATININE 1.17   Estimated Creatinine Clearance: 63.1 mL/min (by C-G formula based on Cr of 1.17). No results for input(s): VANCOTROUGH, VANCOPEAK, VANCORANDOM, GENTTROUGH, GENTPEAK, GENTRANDOM, TOBRATROUGH, TOBRAPEAK, TOBRARND, AMIKACINPEAK, AMIKACINTROU, AMIKACIN in the last 72 hours.   Microbiology: No results found for this or any previous visit (from the past 720 hour(s)).  Medical History: Past Medical History  Diagnosis Date  . Osteoarthritis   . Rheumatoid arthritis(714.0)   . Hyperlipemia   . Diabetes mellitus   . COPD (chronic obstructive pulmonary disease)   . Sleep apnea     cpap    Medications:   (Not in a hospital admission)   Assessment: 81 yo M presents on 2/5 with abdominal pain, SOB, and constipation. On admission he was tachy, febrile to 100.9, and RR was 27. Pharmacy to dose vancomycin and zosyn for sepsis. Pt to receive 1 dose of zosyn 3.375g and load dose of vancomycin 2.5g in the ED. SCr is 1.17, normalized CrCl ~50ml/min.  Goal of Therapy:  Vancomycin trough level 15-20 mcg/ml  Resolution of infection  Plan:  Start Zosyn 3.375g IV Q8 Start vancomycin 1250mg  IV Q24 Monitor renal function, clinical picture F/U abx deescalation  Raevyn Sokol J 02/25/2014,5:15 PM

## 2014-02-25 NOTE — ED Notes (Signed)
[facility] called and advised patient was sent for possible SBO and PNA.   Patient complaints were of abdominal pain, constipation.  Patient had WBC 17.5.

## 2014-02-25 NOTE — ED Notes (Signed)
Pt transported to CT ?

## 2014-02-25 NOTE — ED Notes (Signed)
difficulty obtaining blood cultures from IV site. Phlebotomy notified.

## 2014-02-25 NOTE — ED Provider Notes (Signed)
CSN: [id number]     Arrival date & time 02/25/14  1249 History   First MD Initiated Contact with Patient 02/25/14 1446     Chief Complaint  Patient presents with  . Abdominal Pain  . Shortness of Breath  . Constipation     (Consider location/radiation/quality/duration/timing/severity/associated sxs/prior Treatment) HPI   PCP: Dr. [facility]fon[facility] Blood pressure 127/80, pulse 111, temperature 100.9 F (38.3 C), temperature source Rectal, resp. rate 27, weight 248 lb (112.492 kg), SpO2 92 %.  Lance Powell is a 81 y.o.male with a significant PMH of rheumatoid arthritis, hyperlipidemia, transurethral resection of prostate (self caths at bedtime), diabetes, COPD, abdominal surgeries and sleep apnea presents to the ER sent in by PCP after he was evaluated for severe belly pain that he developed on Thursday. He reports the pain has been severe, his abdomen distended, and "the pain moves". The primary care doctor sent him here due to white count of 17 in the office. On arrival he is tachyardic in the 120's, febrile at 100.9, resp rate of 27. The patient is awake and alert and his only complaint is the abdominal pain. He has not had vomiting, also he reports no bowel movement in the past 3 days. He denies that he has had food in two days but requests food now in the ED.  Denies: SOB, chest pains, lower extremity swelling, weakness (general of focal), headaches, dysuria, urinary hesitancy,    Past Medical History  Diagnosis Date  . Osteoarthritis   . Rheumatoid arthritis(714.0)   . Hyperlipemia   . Diabetes mellitus   . COPD (chronic obstructive pulmonary disease)   . Sleep apnea     cpap   Past Surgical History  Procedure Laterality Date  . Appendectomy  1969  . Nissen fundoplication    . Hernia repair  1990    right and left inguinal  . Vasectomy  1972  . Tonsillectomy  1937  . Transurethral resection of prostate  01/23/2011    Procedure: TRANSURETHRAL RESECTION OF THE  PROSTATE WITH GYRUS INSTRUMENTS;  Surgeon: Molli Hazard, MD;  Location: [facility];  Service: Urology;  Laterality: N/A;  . Cystoscopy  01/23/2011    Procedure: CYSTOSCOPY;  Surgeon: Molli Hazard, MD;  Location: [facility];  Service: Urology;  Laterality: N/A;  . Flexible sigmoidoscopy  01/07/2012    Procedure: FLEXIBLE SIGMOIDOSCOPY;  Surgeon: Ladene Artist, MD,FACG;  Location: [facility];  Service: Endoscopy;  Laterality: N/A;   Family History  Problem Relation Age of Onset  . Emphysema Brother   . Heart disease Mother   . Cancer Daughter     breast   History  Substance Use Topics  . Smoking status: Former Smoker -- 4.00 packs/day for 30 years    Types: Cigarettes    Quit date: 01/21/1978  . Smokeless tobacco: Never Used  . Alcohol Use: No    Review of Systems  10 Systems reviewed and are negative for acute change except as noted in the HPI.   Allergies  Review of patient's allergies indicates no known allergies.  Home Medications   Prior to Admission medications   Medication Sig Start Date End Date Taking? Authorizing Provider  albuterol (PROAIR HFA) 108 (90 BASE) MCG/ACT inhaler Inhale 2 puffs into the lungs every 6 (six) hours as needed for wheezing or shortness of breath. 02/25/11 11/03/13  Tammy S Parrett, NP  aspirin 81 MG tablet Take 1 tablet (81 mg total) by mouth  every morning. Stop for 1 week and then resume 01/08/12   Barton Dubois, MD  atorvastatin (LIPITOR) 40 MG tablet Take 40 mg by mouth every morning.     Historical Provider, MD  celecoxib (CELEBREX) 200 MG capsule Take 1 capsule (200 mg total) by mouth every evening. Hold for the next 5 days and then resume 01/08/12   Barton Dubois, MD  cholecalciferol (VITAMIN D) 1000 UNITS tablet Take 2,000 Units by mouth 2 (two) times daily.     Historical Provider, MD  Indacaterol Maleate (ARCAPTA NEOHALER) 75 MCG CAPS Place 1 capsule into inhaler and inhale every morning.  01/01/12   Kathee Delton, MD  misoprostol (CYTOTEC) 100 MCG tablet Take 200 mcg by mouth 2 (two) times daily.     Historical Provider, MD  Multiple Vitamins-Minerals (CENTRUM) tablet Take 1 tablet by mouth daily.      Historical Provider, MD  pantoprazole (PROTONIX) 40 MG tablet Take 1 tablet (40 mg total) by mouth daily. 01/08/12   Barton Dubois, MD  PARoxetine (PAXIL) 20 MG tablet Take 20 mg by mouth every morning.      Historical Provider, MD  traMADol (ULTRAM) 50 MG tablet Take 1 tablet (50 mg total) by mouth every 8 (eight) hours as needed for pain. 01/08/12   Barton Dubois, MD  vitamin C (ASCORBIC ACID) 500 MG tablet Take 500 mg by mouth daily.    Historical Provider, MD   BP 131/83 mmHg  Pulse 116  Temp(Src) 100.9 F (38.3 C) (Rectal)  Resp 24  Wt 248 lb (112.492 kg)  SpO2 94% Physical Exam  Constitutional: He is oriented to person, place, and time. He appears well-developed and well-nourished. He appears ill. He appears distressed.  HENT:  Head: Normocephalic and atraumatic.  Right Ear: Tympanic membrane and ear canal normal.  Left Ear: Tympanic membrane and ear canal normal.  Nose: Nose normal.  Eyes: Pupils are equal, round, and reactive to light.  Neck: Normal range of motion. Neck supple. No Brudzinski's sign and no Kernig's sign noted.  Cardiovascular: Normal rate and regular rhythm.   Pulmonary/Chest: Effort normal and breath sounds normal. He exhibits no tenderness and no bony tenderness.  Abdominal: Soft. Bowel sounds are normal. He exhibits distension. He exhibits no fluid wave. There is tenderness (diffuse, worse in nonspecific areas. Areas of pain to palpation change throughout exam). There is no rigidity, no rebound, no guarding and no CVA tenderness.  Genitourinary: Rectum normal.  No frank blood in rectal vault. Stool is light colored and brown. No stool noted in rectum.  Neurological: He is alert and oriented to person, place, and time.  Skin: Skin is warm. He is  diaphoretic.  Nursing note and vitals reviewed.    ED Course  Procedures (including critical care time) Labs Review Labs Reviewed  COMPREHENSIVE METABOLIC PANEL - Abnormal; Notable for the following:    Glucose, Bld 156 (*)    GFR calc non Af Amer 56 (*)    GFR calc Af Amer 65 (*)    All other components within normal limits  CBC WITH DIFFERENTIAL/PLATELET - Abnormal; Notable for the following:    WBC 17.0 (*)    Hemoglobin 17.2 (*)    Neutro Abs 12.3 (*)    Monocytes Absolute 2.0 (*)    All other components within normal limits  CULTURE, BLOOD (ROUTINE X 2)  CULTURE, BLOOD (ROUTINE X 2)  URINALYSIS, ROUTINE W REFLEX MICROSCOPIC  LACTIC ACID, PLASMA    Imaging Review Dg Chest  2 View  02/25/2014   CLINICAL DATA:  Abdominal pain and shortness of breath. Constipation. Right-sided epigastric pain. Slight cough.  EXAM: CHEST - 2 VIEW  COMPARISON:  None.  FINDINGS: The heart size is normal. Emphysematous changes are present. No focal airspace disease is evident. Scarring at the lung bases is stable. Surgical clips are present at the GE junction.  IMPRESSION: 1. No acute cardiopulmonary disease. 2. Emphysema.   Electronically Signed   By: Lawrence Santiago M.D.   On: 02/25/2014 14:32     EKG Interpretation None      MDM   Final diagnoses:  Abdominal pain    Concern for early sepsis. Delay of care due to busy department. Will start abx once blood cultures are drawn- delay in obtaining labs because could not pull blood off the IV (per RN) and lab is very busy.   4:41 pm-  Blood cultures and labs drawn- sepsis abx initiated.   Medications  iohexol (OMNIPAQUE) 300 MG/ML solution 25 mL (not administered)  iohexol (OMNIPAQUE) 300 MG/ML solution 100 mL (not administered)  piperacillin-tazobactam (ZOSYN) IVPB 3.375 g (not administered)  vancomycin (VANCOCIN) IVPB 1000 mg/200 mL premix (not administered)  sodium chloride 0.9 % bolus 1,000 mL (1,000 mLs Intravenous New Bag/Given 02/25/14  1542)  morphine 4 MG/ML injection 4 mg (4 mg Intravenous Given 02/25/14 1537)  ondansetron (ZOFRAN) injection 4 mg (4 mg Intravenous Given 02/25/14 1537)  sodium chloride 0.9 % bolus 1,000 mL (1,000 mLs Intravenous New Bag/Given 02/25/14 1542)  acetaminophen (TYLENOL) tablet 650 mg (650 mg Oral Given 02/25/14 1536)    Unknown source of  Infection at this time but pt has abdominal pain- likely SBO as well. At end of shift, Dirk Dress, PA-C has agreed to assume care. Patient to get a CT scan of the abdomen w contrast, blood cultures, lactate and ABX pending. Patient to be admitted once images has resulted.  Filed Vitals:   02/25/14 1545  BP: 127/80  Pulse: 111  Temp:   Resp: [address] PA-C 02/25/14 [place], MD 02/28/14 [phone]

## 2014-02-25 NOTE — ED Notes (Signed)
Patient reports he noted onset of lower abd pain with abd swelling.  He has not had a bm for 3 days.  He has not n/v  He was seen by his MD today and reported to have elevated wbc.  Patient was sent for further eval.  Patient is noted to have elevated heart rate of 126.  Pulse ox 90.  Patient reports his baseline is 91-92% on room air. Patient states he has not been able to rest due to pain.  Patient last had po intake was water 2 hours ago.  No food today or yesterday.

## 2014-02-25 NOTE — ED Provider Notes (Signed)
Patient acquired from Delos Haring, PA-C pending CT scan results with plan for admission.   Medications  iohexol (OMNIPAQUE) 300 MG/ML solution 25 mL (not administered)  vancomycin (VANCOCIN) 2,500 mg in sodium chloride 0.9 % 500 mL IVPB (not administered)  piperacillin-tazobactam (ZOSYN) IVPB 3.375 g (not administered)  sodium chloride 0.9 % bolus 1,000 mL (1,000 mLs Intravenous New Bag/Given 02/25/14 1542)  morphine 4 MG/ML injection 4 mg (4 mg Intravenous Given 02/25/14 1537)  ondansetron (ZOFRAN) injection 4 mg (4 mg Intravenous Given 02/25/14 1537)  sodium chloride 0.9 % bolus 1,000 mL (1,000 mLs Intravenous New Bag/Given 02/25/14 1542)  acetaminophen (TYLENOL) tablet 650 mg (650 mg Oral Given 02/25/14 1536)  iohexol (OMNIPAQUE) 300 MG/ML solution 100 mL (100 mLs Intravenous Contrast Given 02/25/14 1648)  piperacillin-tazobactam (ZOSYN) IVPB 3.375 g (0 g Intravenous Stopped 02/25/14 1841)   Results for orders placed or performed during the hospital encounter of 02/25/14  Comprehensive metabolic panel  Result Value Ref Range   Sodium 135 135 - 145 mmol/L   Potassium 4.6 3.5 - 5.1 mmol/L   Chloride 100 96 - 112 mmol/L   CO2 28 19 - 32 mmol/L   Glucose, Bld 156 (H) 70 - 99 mg/dL   BUN 13 6 - 23 mg/dL   Creatinine, Ser 1.17 0.50 - 1.35 mg/dL   Calcium 10.3 8.4 - 10.5 mg/dL   Total Protein 7.6 6.0 - 8.3 g/dL   Albumin 3.6 3.5 - 5.2 g/dL   AST 29 0 - 37 U/L   ALT 35 0 - 53 U/L   Alkaline Phosphatase 74 39 - 117 U/L   Total Bilirubin 0.9 0.3 - 1.2 mg/dL   GFR calc non Af Amer 56 (L) >90 mL/min   GFR calc Af Amer 65 (L) >90 mL/min   Anion gap 7 5 - 15  CBC with Differential  Result Value Ref Range   WBC 17.0 (H) 4.0 - 10.5 K/uL   RBC 5.50 4.22 - 5.81 MIL/uL   Hemoglobin 17.2 (H) 13.0 - 17.0 g/dL   HCT 50.9 39.0 - 52.0 %   MCV 92.5 78.0 - 100.0 fL   MCH 31.3 26.0 - 34.0 pg   MCHC 33.8 30.0 - 36.0 g/dL   RDW 14.6 11.5 - 15.5 %   Platelets 188 150 - 400 K/uL   Neutrophils Relative % 72 43  - 77 %   Neutro Abs 12.3 (H) 1.7 - 7.7 K/uL   Lymphocytes Relative 16 12 - 46 %   Lymphs Abs 2.6 0.7 - 4.0 K/uL   Monocytes Relative 12 3 - 12 %   Monocytes Absolute 2.0 (H) 0.1 - 1.0 K/uL   Eosinophils Relative 0 0 - 5 %   Eosinophils Absolute 0.0 0.0 - 0.7 K/uL   Basophils Relative 0 0 - 1 %   Basophils Absolute 0.0 0.0 - 0.1 K/uL  Urinalysis, Routine w reflex microscopic  Result Value Ref Range   Color, Urine YELLOW YELLOW   APPearance CLEAR CLEAR   Specific Gravity, Urine 1.023 1.005 - 1.030   pH 5.5 5.0 - 8.0   Glucose, UA 100 (A) NEGATIVE mg/dL   Hgb urine dipstick NEGATIVE NEGATIVE   Bilirubin Urine NEGATIVE NEGATIVE   Ketones, ur NEGATIVE NEGATIVE mg/dL   Protein, ur NEGATIVE NEGATIVE mg/dL   Urobilinogen, UA 0.2 0.0 - 1.0 mg/dL   Nitrite NEGATIVE NEGATIVE   Leukocytes, UA NEGATIVE NEGATIVE  Lactic acid, plasma  Result Value Ref Range   Lactic Acid, Venous 1.2 0.5 -  2.0 mmol/L  POC occult blood, ED Provider will collect  Result Value Ref Range   Fecal Occult Bld NEGATIVE NEGATIVE   Dg Chest 2 View  02/25/2014   CLINICAL DATA:  Abdominal pain and shortness of breath. Constipation. Right-sided epigastric pain. Slight cough.  EXAM: CHEST - 2 VIEW  COMPARISON:  None.  FINDINGS: The heart size is normal. Emphysematous changes are present. No focal airspace disease is evident. Scarring at the lung bases is stable. Surgical clips are present at the GE junction.  IMPRESSION: 1. No acute cardiopulmonary disease. 2. Emphysema.   Electronically Signed   By: Lawrence Santiago M.D.   On: 02/25/2014 14:32   Ct Abdomen Pelvis W Contrast  02/25/2014   CLINICAL DATA:  Right lower quadrant pain x3 days, constipation, abdominal swelling. Prior appendectomy.  EXAM: CT ABDOMEN AND PELVIS WITH CONTRAST  TECHNIQUE: Multidetector CT imaging of the abdomen and pelvis was performed using the standard protocol following bolus administration of intravenous contrast.  CONTRAST:  163mL OMNIPAQUE IOHEXOL 300  MG/ML  SOLN  COMPARISON:  None.  FINDINGS: Lower chest:  Emphysematous changes at the lung bases.  Hepatobiliary: Liver is notable for a nodular hepatic contour, suggesting cirrhosis. No focal hepatic lesion is seen.  2.5 cm gallstone in the gallbladder neck. Associated gallbladder wall thickening with pericholecystic fluid/inflammatory changes.  Pancreas: Within normal limits.  Spleen: Calcified splenic granulomata.  Adrenals/Urinary Tract: Adrenal glands are within normal limits.  Kidneys are within normal limits.  No hydronephrosis.  Distended bladder with trabeculations.  Stomach/Bowel: Surgical clips at the GE junction.  Stomach is unremarkable.  Stable dilated loop of small bowel leading to a ventral hernia in the anterior mid abdomen (series 3/image 54), with a decompressed loop exiting the hernia, indicating some degree of partial obstruction. However, this appearance is chronic.  Prior appendectomy.  Sigmoid diverticulosis, with mild inflammatory changes involving the proximal sigmoid colon (series 3/image 64), compatible with mild sigmoid diverticulitis. No drainable fluid collection/abscess. No free air.  Vascular/Lymphatic: Atherosclerotic calcifications of the abdominal aorta and branch vessels.  No suspicious abdominopelvic lymphadenopathy.  Reproductive: Prostate is grossly unremarkable.  Other: Postsurgical changes related to prior right inguinal hernia repair. Recurrent fat containing right inguinal hernia.  Small fat containing left inguinal hernia.  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  Musculoskeletal: Degenerative changes of the visualized thoracolumbar spine, most prominent at L4-5.  IMPRESSION: Cholelithiasis with suspected acute cholecystitis.  Suspected mild sigmoid diverticulitis. No drainable fluid collection/abscess. No free air.  Ventral hernia in the anterior mid  abdominal wall with some degree of secondary partial small bowel bowel obstruction, chronic.  Numerous additional stable ancillary findings as above.   Electronically Signed   By: Julian Hy M.D.   On: 02/25/2014 17:27   CRITICAL CARE Performed by: Baron Sane L   Total critical care time: 45 minutes  Critical care time was exclusive of separately billable procedures and treating other patients.  Critical care was necessary to treat or prevent imminent or life-threatening deterioration.  Critical care was time spent personally by me on the following activities: development of treatment plan with patient and/or surrogate as well as nursing, discussions with consultants, evaluation of patient's response to treatment, examination of patient, obtaining history from patient or surrogate, ordering and performing treatments and interventions, ordering and review of laboratory studies, ordering and review of radiographic studies, pulse oximetry and  re-evaluation of patient's condition.   1. Diverticulitis of large intestine without perforation or abscess without bleeding   2. Abdominal pain   3. Right lower quadrant abdominal pain   4. Abdominal pain, chronic, right lower quadrant   5. Sepsis, due to unspecified organism    Discussed cholecystitis with cholelithiasis noted on CT scan with Dr. Redmond Pulling of general surgery who will consult on the patient.   I have reviewed nursing notes, vital signs, and all appropriate lab and imaging results for this patient.  Abdomen soft, distended, without peritoneal signs on re-evaluation. Sepsis likely coming from diverticulitis, discussed CT scan results with patient and his family. We'll discontinue vancomycin. Discussed patient case with Dr. Roel Cluck who will admit the patient, keep patient on Zosyn no Flagyl at this time.   Harlow Mares, PA-C 02/25/14 [place], MD 02/28/14 [phone]

## 2014-02-26 DIAGNOSIS — R339 Retention of urine, unspecified: Secondary | ICD-10-CM

## 2014-02-26 DIAGNOSIS — A419 Sepsis, unspecified organism: Principal | ICD-10-CM

## 2014-02-26 LAB — CBC
HCT: 46.5 % (ref 39.0–52.0)
Hemoglobin: 15.4 g/dL (ref 13.0–17.0)
MCH: 30.9 pg (ref 26.0–34.0)
MCHC: 33.1 g/dL (ref 30.0–36.0)
MCV: 93.2 fL (ref 78.0–100.0)
Platelets: 173 10*3/uL (ref 150–400)
RBC: 4.99 MIL/uL (ref 4.22–5.81)
RDW: 14.9 % (ref 11.5–15.5)
WBC: 13 10*3/uL — AB (ref 4.0–10.5)

## 2014-02-26 LAB — PROTIME-INR
INR: 1.23 (ref 0.00–1.49)
PROTHROMBIN TIME: 15.6 s — AB (ref 11.6–15.2)

## 2014-02-26 LAB — GLUCOSE, CAPILLARY
GLUCOSE-CAPILLARY: 132 mg/dL — AB (ref 70–99)
GLUCOSE-CAPILLARY: 122 mg/dL — AB (ref 70–99)
GLUCOSE-CAPILLARY: 109 mg/dL — AB (ref 70–99)
GLUCOSE-CAPILLARY: 106 mg/dL — AB (ref 70–99)
Glucose-Capillary: 114 mg/dL — ABNORMAL HIGH (ref 70–99)
Glucose-Capillary: 148 mg/dL — ABNORMAL HIGH (ref 70–99)

## 2014-02-26 LAB — COMPREHENSIVE METABOLIC PANEL
ALT: 256 U/L — ABNORMAL HIGH (ref 0–53)
AST: 265 U/L — AB (ref 0–37)
Albumin: 3 g/dL — ABNORMAL LOW (ref 3.5–5.2)
Alkaline Phosphatase: 93 U/L (ref 39–117)
Anion gap: 7 (ref 5–15)
BUN: 11 mg/dL (ref 6–23)
CALCIUM: 8.7 mg/dL (ref 8.4–10.5)
CO2: 26 mmol/L (ref 19–32)
Chloride: 102 mmol/L (ref 96–112)
Creatinine, Ser: 1.1 mg/dL (ref 0.50–1.35)
GFR calc Af Amer: 70 mL/min — ABNORMAL LOW (ref >90)
GFR calc non Af Amer: 61 mL/min — ABNORMAL LOW (ref >90)
Glucose, Bld: 140 mg/dL — ABNORMAL HIGH (ref 70–99)
Potassium: 4 mmol/L (ref 3.5–5.1)
Sodium: 135 mmol/L (ref 135–145)
Total Bilirubin: 1.6 mg/dL — ABNORMAL HIGH (ref 0.3–1.2)
Total Protein: 6.6 g/dL (ref 6.0–8.3)

## 2014-02-26 LAB — TSH: TSH: 1.706 u[IU]/mL (ref 0.350–4.500)

## 2014-02-26 LAB — APTT: APTT: 35 s (ref 24–37)

## 2014-02-26 LAB — PHOSPHORUS: Phosphorus: 2.4 mg/dL (ref 2.3–4.6)

## 2014-02-26 LAB — LIPASE, BLOOD: Lipase: 66 U/L — ABNORMAL HIGH (ref 11–59)

## 2014-02-26 LAB — MAGNESIUM: Magnesium: 1.4 mg/dL — ABNORMAL LOW (ref 1.5–2.5)

## 2014-02-26 MED ORDER — MAGNESIUM SULFATE 2 GM/50ML IV SOLN
2.0000 g | Freq: Once | INTRAVENOUS | Status: AC
  Administered 2014-02-26: 2 g via INTRAVENOUS
  Filled 2014-02-26: qty 50

## 2014-02-26 MED ORDER — TIOTROPIUM BROMIDE MONOHYDRATE 18 MCG IN CAPS
18.0000 ug | ORAL_CAPSULE | Freq: Every day | RESPIRATORY_TRACT | Status: DC
  Administered 2014-02-28 – 2014-03-01 (×2): 18 ug via RESPIRATORY_TRACT
  Filled 2014-02-26: qty 5

## 2014-02-26 MED ORDER — IPRATROPIUM BROMIDE 0.02 % IN SOLN: 0.5000 mg | Freq: Four times a day (QID) | RESPIRATORY_TRACT | Status: DC | PRN

## 2014-02-26 MED ORDER — LACTATED RINGERS IV SOLN
INTRAVENOUS | Status: DC
  Administered 2014-02-26 – 2014-02-27 (×2): via INTRAVENOUS
  Administered 2014-02-28: 1000 mL via INTRAVENOUS

## 2014-02-26 MED ORDER — CETYLPYRIDINIUM CHLORIDE 0.05 % MT LIQD
7.0000 mL | Freq: Two times a day (BID) | OROMUCOSAL | Status: DC
  Administered 2014-02-26 – 2014-03-01 (×3): 7 mL via OROMUCOSAL

## 2014-02-26 NOTE — Progress Notes (Signed)
[facility] PROGRESS NOTE   Lance Powell BLT:[id number] DOB: 1929-10-30 DOA: 02/25/2014 PCP: Lauraine Rinne, MD  HPI/Subjective: Feels okay, denies any pain unless he moves. Slight nausea, currently nothing by mouth.  Assessment/Plan: Active Problems:   DM type 2, not at goal   Obstructive sleep apnea   COPD (chronic obstructive pulmonary disease)   Diverticulitis   Urinary retention   Hypertension   SBO (small bowel obstruction)   Sepsis   Cholecystitis    Acute cholecystitis With evidence of sepsis, patient is started on IV Zosyn. General surgery consulted, recommended surgery at some point. Slightly elevated lipase of 66 (normal is <59) no mention of pancreatitis on the CT scan.  Sepsis Met sepsis criteria with respiratory rate of 27, heart rate of 112, WBCs of 17K and presence of infection on admission. IV fluids resuscitation was given. Started on IV Zosyn as sepsis causes probably acute cholecystitis/diverticulitis.  Acute diverticulitis CT scan also showed mild diverticulitis and patient was symptomatic. Patient is on IV Zosyn that should cover it, also bowel rest.  Cardiac risk stratification Patient does not have no history of CAD, CHF, CKD, CVA or IDDM.  His cardiac risk for surgery is low to moderate. Patient denies any SOB, CP, palpitations or DOE. I spoke with Dr. Marigene Ehlers, recommended no further testing as nothing is going to alter his outcome.  SBO Patient does have hernia, with chronic loops inside it, likely this is not acute.  COPD Continue nebulizers, denies any complaints.  Hypertension Enalapril held.    Code Status: Full Code Family Communication: Plan discussed with the patient. Disposition Plan: Remains inpatient Diet: Diet NPO time specified  Consultants:  Gen. surgery  Procedures:  None  Antibiotics:  Zosyn   Objective: Filed Vitals:   02/26/14 1210  BP: 119/74  Pulse: 110  Temp:   Resp:      Intake/Output Summary (Last 24 hours) at 02/26/14 1313 Last data filed at 02/25/14 2241  Gross per 24 hour  Intake      0 ml  Output    100 ml  Net   -100 ml   Filed Weights   02/25/14 1328 02/25/14 2045  Weight: 112.492 kg (248 lb) 111.4 kg (245 lb 9.5 oz)    Exam: General: Alert and awake, oriented x3, not in any acute distress. HEENT: anicteric sclera, pupils reactive to light and accommodation, EOMI CVS: S1-S2 clear, no murmur rubs or gallops Chest: clear to auscultation bilaterally, no wheezing, rales or rhonchi Abdomen: soft nontender, nondistended, normal bowel sounds, no organomegaly Extremities: no cyanosis, clubbing or edema noted bilaterally Neuro: Cranial nerves II-XII intact, no focal neurological deficits  Data Reviewed: Basic Metabolic Panel:  Recent Labs Lab 02/25/14 1334 02/26/14 0445  NA 135 135  K 4.6 4.0  CL 100 102  CO2 28 26  GLUCOSE 156* 140*  BUN 13 11  CREATININE 1.17 1.10  CALCIUM 10.3 8.7  MG  --  1.4*  PHOS  --  2.4   Liver Function Tests:  Recent Labs Lab 02/25/14 1334 02/26/14 0445  AST 29 265*  ALT 35 256*  ALKPHOS 74 93  BILITOT 0.9 1.6*  PROT 7.6 6.6  ALBUMIN 3.6 3.0*    Recent Labs Lab 02/26/14 0445  LIPASE 66*   No results for input(s): AMMONIA in the last 168 hours. CBC:  Recent Labs Lab 02/25/14 1334 02/26/14 0445  WBC 17.0* 13.0*  NEUTROABS 12.3*  --   HGB 17.2* 15.4  HCT 50.9 46.5  MCV 92.5 93.2  PLT 188 173   Cardiac Enzymes: No results for input(s): CKTOTAL, CKMB, CKMBINDEX, TROPONINI in the last 168 hours. BNP (last 3 results) No results for input(s): BNP in the last 8760 hours.  ProBNP (last 3 results) No results for input(s): PROBNP in the last 8760 hours.  CBG:  Recent Labs Lab 02/25/14 2121 02/26/14 0041 02/26/14 0525 02/26/14 0827 02/26/14 1218  GLUCAP 112* 114* 148* 132* 122*    Micro No results found for this or any previous visit (from the past 240 hour(s)).    Studies: Dg Chest 2 View  02/25/2014   CLINICAL DATA:  Abdominal pain and shortness of breath. Constipation. Right-sided epigastric pain. Slight cough.  EXAM: CHEST - 2 VIEW  COMPARISON:  None.  FINDINGS: The heart size is normal. Emphysematous changes are present. No focal airspace disease is evident. Scarring at the lung bases is stable. Surgical clips are present at the GE junction.  IMPRESSION: 1. No acute cardiopulmonary disease. 2. Emphysema.   Electronically Signed   By: Lawrence Santiago M.D.   On: 02/25/2014 14:32   Ct Abdomen Pelvis W Contrast  02/25/2014   CLINICAL DATA:  Right lower quadrant pain x3 days, constipation, abdominal swelling. Prior appendectomy.  EXAM: CT ABDOMEN AND PELVIS WITH CONTRAST  TECHNIQUE: Multidetector CT imaging of the abdomen and pelvis was performed using the standard protocol following bolus administration of intravenous contrast.  CONTRAST:  117m OMNIPAQUE IOHEXOL 300 MG/ML  SOLN  COMPARISON:  None.  FINDINGS: Lower chest:  Emphysematous changes at the lung bases.  Hepatobiliary: Liver is notable for a nodular hepatic contour, suggesting cirrhosis. No focal hepatic lesion is seen.  2.5 cm gallstone in the gallbladder neck. Associated gallbladder wall thickening with pericholecystic fluid/inflammatory changes.  Pancreas: Within normal limits.  Spleen: Calcified splenic granulomata.  Adrenals/Urinary Tract: Adrenal glands are within normal limits.  Kidneys are within normal limits.  No hydronephrosis.  Distended bladder with trabeculations.  Stomach/Bowel: Surgical clips at the GE junction.  Stomach is unremarkable.  Stable dilated loop of small bowel leading to a ventral hernia in the anterior mid abdomen (series 3/image 54), with a decompressed loop exiting the hernia, indicating some degree of partial obstruction. However, this appearance is chronic.  Prior appendectomy.  Sigmoid diverticulosis, with mild inflammatory changes involving the proximal sigmoid colon (series  3/image 64), compatible with mild sigmoid diverticulitis. No drainable fluid collection/abscess. No free air.  Vascular/Lymphatic: Atherosclerotic calcifications of the abdominal aorta and branch vessels.  No suspicious abdominopelvic lymphadenopathy.  Reproductive: Prostate is grossly unremarkable.  Other: Postsurgical changes related to prior right inguinal hernia repair. Recurrent fat containing right inguinal hernia.  Small fat containing left inguinal hernia.  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  Musculoskeletal: Degenerative changes of the visualized thoracolumbar spine, most prominent at L4-5.  IMPRESSION: Cholelithiasis with suspected acute cholecystitis.  Suspected mild sigmoid diverticulitis. No drainable fluid collection/abscess. No free air.  Ventral hernia in the anterior mid abdominal wall with some degree of secondary partial small bowel bowel obstruction, chronic.  Numerous additional stable ancillary findings as above.   Electronically Signed   By: SJulian HyM.D.   On: 02/25/2014 17:27    Scheduled Meds: . antiseptic oral rinse  7 mL Mouth Rinse BID  . docusate sodium  100 mg Oral BID  . insulin aspart  0-9 Units Subcutaneous 6 times per  day  . mometasone-formoterol  2 puff Inhalation BID  . piperacillin-tazobactam (ZOSYN)  IV  3.375 g Intravenous Q8H  . sodium chloride  3 mL Intravenous Q12H  . [START ON 02/27/2014] tiotropium  18 mcg Inhalation Daily   Continuous Infusions:      Time spent: 35 minutes    , A  [facility] Pager [phone] If 7PM-7AM, please contact night-coverage at [email or web address], password [email or web address] 02/26/2014, 1:13 PM  LOS: 1 day               

## 2014-02-26 NOTE — Progress Notes (Signed)
RT Note:  Patient on home CPAP machine.  Assisted with 2 lpm O2 bleed in.

## 2014-02-26 NOTE — Progress Notes (Signed)
[facility] Progress Note     Subjective: Pt still has a lot of pain only with palpation or if he moves.  Lying in bed he has no pain.  No N/V.  NPO.  Ambulating some OOB.  Pending cardiology evaluation.  No other complaints.  Objective: Vital signs in last 24 hours: Temp:  [97.6 F (36.4 C)-100.9 F (38.3 C)] 98 F (36.7 C) (02/06 0509) Pulse Rate:  [107-124] 113 (02/05 2241) Resp:  [18-27] 18 (02/06 0509) BP: (110-153)/(64-89) 116/64 mmHg (02/06 0509) SpO2:  [91 %-94 %] 94 % (02/06 0509) Weight:  [245 lb 9.5 oz (111.4 kg)-248 lb (112.492 kg)] 245 lb 9.5 oz (111.4 kg) (02/05 2045)    Intake/Output from previous day: 02/05 0701 - 02/06 0700 In: -  Out: 100 [Urine:100] Intake/Output this shift:    PE: Gen:  Alert, NAD, pleasant Abd: Obese, soft, distended, exquisitely tender in the RUQ and epigastrium, +BS, no HSM, scars noted on abdomen  Lab Results:   Recent Labs  02/25/14 1334 02/26/14 0445  WBC 17.0* 13.0*  HGB 17.2* 15.4  HCT 50.9 46.5  PLT 188 173   BMET  Recent Labs  02/25/14 1334 02/26/14 0445  NA 135 135  K 4.6 4.0  CL 100 102  CO2 28 26  GLUCOSE 156* 140*  BUN 13 11  CREATININE 1.17 1.10  CALCIUM 10.3 8.7   PT/INR  Recent Labs  02/26/14 0445  LABPROT 15.6*  INR 1.23   CMP     Component Value Date/Time   NA 135 02/26/2014 0445   K 4.0 02/26/2014 0445   CL 102 02/26/2014 0445   CO2 26 02/26/2014 0445   GLUCOSE 140* 02/26/2014 0445   BUN 11 02/26/2014 0445   CREATININE 1.10 02/26/2014 0445   CALCIUM 8.7 02/26/2014 0445   PROT 6.6 02/26/2014 0445   ALBUMIN 3.0* 02/26/2014 0445   AST 265* 02/26/2014 0445   ALT 256* 02/26/2014 0445   ALKPHOS 93 02/26/2014 0445   BILITOT 1.6* 02/26/2014 0445   GFRNONAA 61* 02/26/2014 0445   GFRAA 70* 02/26/2014 0445   Lipase  No results found for: LIPASE     Studies/Results: Dg Chest 2 View  02/25/2014   CLINICAL DATA:  Abdominal pain and shortness of breath. Constipation.  Right-sided epigastric pain. Slight cough.  EXAM: CHEST - 2 VIEW  COMPARISON:  None.  FINDINGS: The heart size is normal. Emphysematous changes are present. No focal airspace disease is evident. Scarring at the lung bases is stable. Surgical clips are present at the GE junction.  IMPRESSION: 1. No acute cardiopulmonary disease. 2. Emphysema.   Electronically Signed   By: Lawrence Santiago M.D.   On: 02/25/2014 14:32   Ct Abdomen Pelvis W Contrast  02/25/2014   CLINICAL DATA:  Right lower quadrant pain x3 days, constipation, abdominal swelling. Prior appendectomy.  EXAM: CT ABDOMEN AND PELVIS WITH CONTRAST  TECHNIQUE: Multidetector CT imaging of the abdomen and pelvis was performed using the standard protocol following bolus administration of intravenous contrast.  CONTRAST:  176mL OMNIPAQUE IOHEXOL 300 MG/ML  SOLN  COMPARISON:  None.  FINDINGS: Lower chest:  Emphysematous changes at the lung bases.  Hepatobiliary: Liver is notable for a nodular hepatic contour, suggesting cirrhosis. No focal hepatic lesion is seen.  2.5 cm gallstone in the gallbladder neck. Associated gallbladder wall thickening with pericholecystic fluid/inflammatory changes.  Pancreas: Within normal limits.  Spleen: Calcified splenic granulomata.  Adrenals/Urinary Tract: Adrenal glands are within normal limits.  Kidneys are  within normal limits.  No hydronephrosis.  Distended bladder with trabeculations.  Stomach/Bowel: Surgical clips at the GE junction.  Stomach is unremarkable.  Stable dilated loop of small bowel leading to a ventral hernia in the anterior mid abdomen (series 3/image 54), with a decompressed loop exiting the hernia, indicating some degree of partial obstruction. However, this appearance is chronic.  Prior appendectomy.  Sigmoid diverticulosis, with mild inflammatory changes involving the proximal sigmoid colon (series 3/image 64), compatible with mild sigmoid diverticulitis. No drainable fluid collection/abscess. No free air.   Vascular/Lymphatic: Atherosclerotic calcifications of the abdominal aorta and branch vessels.  No suspicious abdominopelvic lymphadenopathy.  Reproductive: Prostate is grossly unremarkable.  Other: Postsurgical changes related to prior right inguinal hernia repair. Recurrent fat containing right inguinal hernia.  Small fat containing left inguinal hernia.  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  Musculoskeletal: Degenerative changes of the visualized thoracolumbar spine, most prominent at L4-5.  IMPRESSION: Cholelithiasis with suspected acute cholecystitis.  Suspected mild sigmoid diverticulitis. No drainable fluid collection/abscess. No free air.  Ventral hernia in the anterior mid abdominal wall with some degree of secondary partial small bowel bowel obstruction, chronic.  Numerous additional stable ancillary findings as above.   Electronically Signed   By: Julian Hy M.D.   On: 02/25/2014 17:27    Anti-infectives: Anti-infectives    Start     Dose/Rate Route Frequency Ordered Stop   02/26/14 2000  vancomycin (VANCOCIN) 1,250 mg in sodium chloride 0.9 % 250 mL IVPB  Status:  Discontinued     1,250 mg166.7 mL/hr over 90 Minutes Intravenous Every 24 hours 02/25/14 1735 02/25/14 1839   02/26/14 1800  vancomycin (VANCOCIN) 1,250 mg in sodium chloride 0.9 % 250 mL IVPB  Status:  Discontinued     1,250 mg166.7 mL/hr over 90 Minutes Intravenous Every 24 hours 02/25/14 1732 02/25/14 1735   02/26/14 0000  piperacillin-tazobactam (ZOSYN) IVPB 3.375 g     3.375 g12.5 mL/hr over 240 Minutes Intravenous Every 8 hours 02/25/14 1732     02/25/14 1815  metroNIDAZOLE (FLAGYL) IVPB 500 mg  Status:  Discontinued     500 mg100 mL/hr over 60 Minutes Intravenous  Once 02/25/14 1811 02/25/14 1836   02/25/14 1700  vancomycin (VANCOCIN) 2,500 mg in sodium chloride 0.9 % 500 mL IVPB  Status:   Discontinued     2,500 mg250 mL/hr over 120 Minutes Intravenous  Once 02/25/14 1647 02/25/14 1959   02/25/14 1645  piperacillin-tazobactam (ZOSYN) IVPB 3.375 g     3.375 g100 mL/hr over 30 Minutes Intravenous  Once 02/25/14 1644 02/25/14 1841   02/25/14 1645  vancomycin (VANCOCIN) IVPB 1000 mg/200 mL premix  Status:  Discontinued     1,000 mg200 mL/hr over 60 Minutes Intravenous  Once 02/25/14 1644 02/25/14 1647       Assessment/Plan Acute calculous cholecystitis Sigmoid diverticulitis Leukocytosis down to 13.0 today Ventral incisional hernia H/o appendectomy, nissen fundoplication, L&R IHR, transurethral resection of prostate, sigmoidectomy (colovesical fistula) OSA on CPAP COPD DM2 HTN Mild dehydration Morbid obesity   Plan: 1.  Appears his diverticulitis is improving. Radiographically, the area of inflammation in sigmoid was small and no sign of microperf. His ventral hernia is soft, nontender, and reducible. He is having flatus and his SB doesn't appear significantly dilated. Given his RUQ TTP and CT findings this is c/w acute cholecystitis.  2.  IV abx., IVF, bowel  rest for both cholecystitis and diverticulitis 3. Pain control, antiemetics 4.  Not sure if cards was called for pre-op clearance.  He will need this prior to proceeding.  Surgery likely tomorrow.  Will not plan on addressing incisional hernia during this admission unless intraop issue.  5.  Liver nodular on CT, coags okay.  LFT's have gone up significantly.  Could be due to stone in neck of the GB causing full obstruction currently, but may be another stone in the bile ducts.  No recent etoh use but does report remote h/o significant drinking. Bili up today to 1.6, continue to monitor labs tomorrow am.  Check lipase today.     LOS: 1 day    Coralie Keens 02/26/2014, 7:54 AM Pager: [phone]

## 2014-02-27 ENCOUNTER — Encounter (HOSPITAL_COMMUNITY): Payer: Self-pay | Admitting: Radiology

## 2014-02-27 ENCOUNTER — Inpatient Hospital Stay (HOSPITAL_COMMUNITY): Payer: Medicare Other

## 2014-02-27 DIAGNOSIS — K8000 Calculus of gallbladder with acute cholecystitis without obstruction: Secondary | ICD-10-CM | POA: Diagnosis present

## 2014-02-27 DIAGNOSIS — K810 Acute cholecystitis: Secondary | ICD-10-CM | POA: Diagnosis present

## 2014-02-27 DIAGNOSIS — G4733 Obstructive sleep apnea (adult) (pediatric): Secondary | ICD-10-CM

## 2014-02-27 LAB — CBC
HCT: 44.1 % (ref 39.0–52.0)
HEMOGLOBIN: 14.8 g/dL (ref 13.0–17.0)
MCH: 31.1 pg (ref 26.0–34.0)
MCHC: 33.6 g/dL (ref 30.0–36.0)
MCV: 92.6 fL (ref 78.0–100.0)
Platelets: 162 10*3/uL (ref 150–400)
RBC: 4.76 MIL/uL (ref 4.22–5.81)
RDW: 14.9 % (ref 11.5–15.5)
WBC: 16.8 10*3/uL — AB (ref 4.0–10.5)

## 2014-02-27 LAB — COMPREHENSIVE METABOLIC PANEL
ALK PHOS: 87 U/L (ref 39–117)
ALT: 123 U/L — ABNORMAL HIGH (ref 0–53)
ANION GAP: 7 (ref 5–15)
AST: 67 U/L — ABNORMAL HIGH (ref 0–37)
Albumin: 2.7 g/dL — ABNORMAL LOW (ref 3.5–5.2)
BILIRUBIN TOTAL: 1.4 mg/dL — AB (ref 0.3–1.2)
BUN: 13 mg/dL (ref 6–23)
CO2: 25 mmol/L (ref 19–32)
CREATININE: 0.98 mg/dL (ref 0.50–1.35)
Calcium: 8.7 mg/dL (ref 8.4–10.5)
Chloride: 105 mmol/L (ref 96–112)
GFR calc Af Amer: 86 mL/min — ABNORMAL LOW (ref >90)
GFR calc non Af Amer: 74 mL/min — ABNORMAL LOW (ref >90)
GLUCOSE: 132 mg/dL — AB (ref 70–99)
Potassium: 4 mmol/L (ref 3.5–5.1)
SODIUM: 137 mmol/L (ref 135–145)
TOTAL PROTEIN: 6.1 g/dL (ref 6.0–8.3)

## 2014-02-27 LAB — GLUCOSE, CAPILLARY
GLUCOSE-CAPILLARY: 129 mg/dL — AB (ref 70–99)
Glucose-Capillary: 109 mg/dL — ABNORMAL HIGH (ref 70–99)
Glucose-Capillary: 122 mg/dL — ABNORMAL HIGH (ref 70–99)
Glucose-Capillary: 131 mg/dL — ABNORMAL HIGH (ref 70–99)
Glucose-Capillary: 135 mg/dL — ABNORMAL HIGH (ref 70–99)
Glucose-Capillary: 168 mg/dL — ABNORMAL HIGH (ref 70–99)

## 2014-02-27 LAB — URINE CULTURE: Colony Count: 2000

## 2014-02-27 LAB — MAGNESIUM: Magnesium: 2.1 mg/dL (ref 1.5–2.5)

## 2014-02-27 MED ORDER — MIDAZOLAM HCL 2 MG/2ML IJ SOLN
INTRAMUSCULAR | Status: AC | PRN
  Administered 2014-02-27: 0.5 mg via INTRAVENOUS
  Administered 2014-02-27: 1 mg via INTRAVENOUS

## 2014-02-27 MED ORDER — MIDAZOLAM HCL 2 MG/2ML IJ SOLN
INTRAMUSCULAR | Status: AC
  Filled 2014-02-27: qty 4

## 2014-02-27 MED ORDER — FENTANYL CITRATE 0.05 MG/ML IJ SOLN
INTRAMUSCULAR | Status: AC
  Filled 2014-02-27: qty 4

## 2014-02-27 MED ORDER — LIDOCAINE HCL (PF) 1 % IJ SOLN
INTRAMUSCULAR | Status: AC
  Filled 2014-02-27: qty 30

## 2014-02-27 MED ORDER — FENTANYL CITRATE 0.05 MG/ML IJ SOLN
INTRAMUSCULAR | Status: AC | PRN
  Administered 2014-02-27: 50 ug via INTRAVENOUS
  Administered 2014-02-27: 25 ug via INTRAVENOUS

## 2014-02-27 MED ORDER — IOHEXOL 300 MG/ML  SOLN
50.0000 mL | Freq: Once | INTRAMUSCULAR | Status: AC | PRN
  Administered 2014-02-27: 20 mL

## 2014-02-27 MED ORDER — LIDOCAINE-EPINEPHRINE (PF) 1 %-1:200000 IJ SOLN
INTRAMUSCULAR | Status: AC
  Filled 2014-02-27: qty 10

## 2014-02-27 NOTE — Sedation Documentation (Signed)
Patient denies pain and is resting comfortably.  

## 2014-02-27 NOTE — Progress Notes (Signed)
[facility] Progress Note     Subjective: Pain not improved.  No N/V, wants surgery out of the way but understands our concern and need for perc chole tube.  Ambulating OOB.  Has CPAP on currently.    Objective: Vital signs in last 24 hours: Temp:  [98.8 F (37.1 C)-99.7 F (37.6 C)] 98.9 F (37.2 C) (02/07 0627) Pulse Rate:  [99-110] 99 (02/07 0627) Resp:  [18-20] 18 (02/07 0627) BP: (109-119)/(63-74) 110/63 mmHg (02/07 0627) SpO2:  [88 %-95 %] 91 % (02/07 0627) Last BM Date: 02/25/14  Intake/Output from previous day: 02/06 0701 - 02/07 0700 In: 295.8 [I.V.:145.8; IV Piggyback:150] Out: 1150 [Urine:1150] Intake/Output this shift:    PE: Gen:  Alert, NAD, pleasant Abd: Obese, soft, mildly distended, quite tender in RUQ and epigastrium, +BS, no HSM, abdominal scars noted   Lab Results:   Recent Labs  02/26/14 0445 02/27/14 0655  WBC 13.0* 16.8*  HGB 15.4 14.8  HCT 46.5 44.1  PLT 173 162   BMET  Recent Labs  02/25/14 1334 02/26/14 0445  NA 135 135  K 4.6 4.0  CL 100 102  CO2 28 26  GLUCOSE 156* 140*  BUN 13 11  CREATININE 1.17 1.10  CALCIUM 10.3 8.7   PT/INR  Recent Labs  02/26/14 0445  LABPROT 15.6*  INR 1.23   CMP     Component Value Date/Time   NA 135 02/26/2014 0445   K 4.0 02/26/2014 0445   CL 102 02/26/2014 0445   CO2 26 02/26/2014 0445   GLUCOSE 140* 02/26/2014 0445   BUN 11 02/26/2014 0445   CREATININE 1.10 02/26/2014 0445   CALCIUM 8.7 02/26/2014 0445   PROT 6.6 02/26/2014 0445   ALBUMIN 3.0* 02/26/2014 0445   AST 265* 02/26/2014 0445   ALT 256* 02/26/2014 0445   ALKPHOS 93 02/26/2014 0445   BILITOT 1.6* 02/26/2014 0445   GFRNONAA 61* 02/26/2014 0445   GFRAA 70* 02/26/2014 0445   Lipase     Component Value Date/Time   LIPASE 66* 02/26/2014 0445       Studies/Results: Dg Chest 2 View  02/25/2014   CLINICAL DATA:  Abdominal pain and shortness of breath. Constipation. Right-sided epigastric pain. Slight  cough.  EXAM: CHEST - 2 VIEW  COMPARISON:  None.  FINDINGS: The heart size is normal. Emphysematous changes are present. No focal airspace disease is evident. Scarring at the lung bases is stable. Surgical clips are present at the GE junction.  IMPRESSION: 1. No acute cardiopulmonary disease. 2. Emphysema.   Electronically Signed   By: Lawrence Santiago M.D.   On: 02/25/2014 14:32   Ct Abdomen Pelvis W Contrast  02/25/2014   CLINICAL DATA:  Right lower quadrant pain x3 days, constipation, abdominal swelling. Prior appendectomy.  EXAM: CT ABDOMEN AND PELVIS WITH CONTRAST  TECHNIQUE: Multidetector CT imaging of the abdomen and pelvis was performed using the standard protocol following bolus administration of intravenous contrast.  CONTRAST:  160mL OMNIPAQUE IOHEXOL 300 MG/ML  SOLN  COMPARISON:  None.  FINDINGS: Lower chest:  Emphysematous changes at the lung bases.  Hepatobiliary: Liver is notable for a nodular hepatic contour, suggesting cirrhosis. No focal hepatic lesion is seen.  2.5 cm gallstone in the gallbladder neck. Associated gallbladder wall thickening with pericholecystic fluid/inflammatory changes.  Pancreas: Within normal limits.  Spleen: Calcified splenic granulomata.  Adrenals/Urinary Tract: Adrenal glands are within normal limits.  Kidneys are within normal limits.  No hydronephrosis.  Distended bladder with trabeculations.  Stomach/Bowel:  Surgical clips at the GE junction.  Stomach is unremarkable.  Stable dilated loop of small bowel leading to a ventral hernia in the anterior mid abdomen (series 3/image 54), with a decompressed loop exiting the hernia, indicating some degree of partial obstruction. However, this appearance is chronic.  Prior appendectomy.  Sigmoid diverticulosis, with mild inflammatory changes involving the proximal sigmoid colon (series 3/image 64), compatible with mild sigmoid diverticulitis. No drainable fluid collection/abscess. No free air.  Vascular/Lymphatic: Atherosclerotic  calcifications of the abdominal aorta and branch vessels.  No suspicious abdominopelvic lymphadenopathy.  Reproductive: Prostate is grossly unremarkable.  Other: Postsurgical changes related to prior right inguinal hernia repair. Recurrent fat containing right inguinal hernia.  Small fat containing left inguinal hernia.  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  Musculoskeletal: Degenerative changes of the visualized thoracolumbar spine, most prominent at L4-5.  IMPRESSION: Cholelithiasis with suspected acute cholecystitis.  Suspected mild sigmoid diverticulitis. No drainable fluid collection/abscess. No free air.  Ventral hernia in the anterior mid abdominal wall with some degree of secondary partial small bowel bowel obstruction, chronic.  Numerous additional stable ancillary findings as above.   Electronically Signed   By: Julian Hy M.D.   On: 02/25/2014 17:27    Anti-infectives: Anti-infectives    Start     Dose/Rate Route Frequency Ordered Stop   02/26/14 2000  vancomycin (VANCOCIN) 1,250 mg in sodium chloride 0.9 % 250 mL IVPB  Status:  Discontinued     1,250 mg166.7 mL/hr over 90 Minutes Intravenous Every 24 hours 02/25/14 1735 02/25/14 1839   02/26/14 1800  vancomycin (VANCOCIN) 1,250 mg in sodium chloride 0.9 % 250 mL IVPB  Status:  Discontinued     1,250 mg166.7 mL/hr over 90 Minutes Intravenous Every 24 hours 02/25/14 1732 02/25/14 1735   02/26/14 0000  piperacillin-tazobactam (ZOSYN) IVPB 3.375 g     3.375 g12.5 mL/hr over 240 Minutes Intravenous Every 8 hours 02/25/14 1732     02/25/14 1815  metroNIDAZOLE (FLAGYL) IVPB 500 mg  Status:  Discontinued     500 mg100 mL/hr over 60 Minutes Intravenous  Once 02/25/14 1811 02/25/14 1836   02/25/14 1700  vancomycin (VANCOCIN) 2,500 mg in sodium chloride 0.9 % 500 mL IVPB  Status:  Discontinued     2,500 mg250 mL/hr over  120 Minutes Intravenous  Once 02/25/14 1647 02/25/14 1959   02/25/14 1645  piperacillin-tazobactam (ZOSYN) IVPB 3.375 g     3.375 g100 mL/hr over 30 Minutes Intravenous  Once 02/25/14 1644 02/25/14 1841   02/25/14 1645  vancomycin (VANCOCIN) IVPB 1000 mg/200 mL premix  Status:  Discontinued     1,000 mg200 mL/hr over 60 Minutes Intravenous  Once 02/25/14 1644 02/25/14 1647       Assessment/Plan Acute calculous cholecystitis Sigmoid diverticulitis Leukocytosis up to 16.8 today Ventral incisional hernia H/o appendectomy, nissen fundoplication, L&R IHR, transurethral resection of prostate, sigmoidectomy (colovesical fistula) OSA on CPAP COPD DM2 HTN Mild dehydration Morbid obesity   Plan: 1. Appears his diverticulitis is improving. Radiographically, the area of inflammation in sigmoid was small and no sign of microperf. His ventral hernia is soft, nontender, and reducible. He is having flatus and his SB doesn't appear significantly dilated. Given his RUQ TTP and CT findings this is c/w acute cholecystitis.  2. IV abx., IVF, bowel rest for both cholecystitis and diverticulitis 3.  Pain control, antiemetics 4. Cards cleared him  without seeing him with low to moderate risk.  No further cardiac workup would be needed.  Will haver IR place perc chole drain and plan interval cholecystectomy in 6-8 weeks down the road when diverticulitis and acute cholecystitis can resolve. 5. Liver nodular on CT, coags okay. LFT's improved today, but WBC up. Bili downto 1.4 today. Lipase 66 yesterday. 6.  Talked to Dr. Pascal Lux who is currently calling in the IR team to place perc chole tube.    LOS: 2 days    Coralie Keens 02/27/2014, 7:56 AM Pager: [phone]

## 2014-02-27 NOTE — Progress Notes (Signed)
[facility] PROGRESS NOTE   Lance Powell TXM:[id number] DOB: 09-Oct-1929 DOA: 02/25/2014 PCP: Lauraine Rinne, MD  HPI/Subjective: The seen prior to his procedure, denies any complaints. Feels very okay. Status post percutaneous cholecystostomy placed in the IR with a US guidance.  Assessment/Plan: Active Problems:   DM type 2, not at goal   Obstructive sleep apnea   COPD (chronic obstructive pulmonary disease)   Diverticulitis   Urinary retention   Hypertension   SBO (small bowel obstruction)   Sepsis   Cholecystitis   Calculus of gallbladder with acute cholecystitis   Acute cholecystitis    Acute cholecystitis With evidence of sepsis, patient is started on IV Zosyn. General surgery consulted, s/p percutaneous cholecystostomy. Continue antibiotics for now, patient will probably cholecystectomy later.  Sepsis Met sepsis criteria with respiratory rate of 27, heart rate of 112, WBCs of 17K and presence of infection on admission. IV fluids resuscitation was given. Started on IV Zosyn as sepsis causes probably acute cholecystitis/diverticulitis. Sepsis physiology resolved, patient improved.  Acute diverticulitis CT scan also showed mild diverticulitis and patient was symptomatic. Patient is on IV Zosyn that should cover it, also bowel rest. Will start on clear liquids if it's okay with general surgery.  Cardiac risk stratification Patient does not have no history of CAD, CHF, CKD, CVA or IDDM.  His cardiac risk for surgery is low to moderate. Patient denies any SOB, CP, palpitations or DOE. I spoke with Dr. Marigene Ehlers, recommended no further testing as nothing is going to alter his outcome. Patient has first-degree AV block, no changes on EKG since 2013.  SBO Patient does have hernia, with chronic loops inside it, likely this is not acute.  COPD Continue nebulizers, denies any complaints.  Hypertension Enalapril held.    Code Status: Full Code Family  Communication: Plan discussed with the patient. Disposition Plan: Remains inpatient Diet: Diet NPO time specified  Consultants:  Gen. surgery  Procedures:  None  Antibiotics:  Zosyn   Objective: Filed Vitals:   02/27/14 1131  BP: 137/84  Pulse: 101  Temp:   Resp: 20    Intake/Output Summary (Last 24 hours) at 02/27/14 1156 Last data filed at 02/27/14 4825  Gross per 24 hour  Intake 245.83 ml  Output   1150 ml  Net -904.17 ml   Filed Weights   02/25/14 1328 02/25/14 2045  Weight: 112.492 kg (248 lb) 111.4 kg (245 lb 9.5 oz)    Exam: General: Alert and awake, oriented x3, not in any acute distress. HEENT: anicteric sclera, pupils reactive to light and accommodation, EOMI CVS: S1-S2 clear, no murmur rubs or gallops Chest: clear to auscultation bilaterally, no wheezing, rales or rhonchi Abdomen: soft nontender, nondistended, normal bowel sounds, no organomegaly Extremities: no cyanosis, clubbing or edema noted bilaterally Neuro: Cranial nerves II-XII intact, no focal neurological deficits  Data Reviewed: Basic Metabolic Panel:  Recent Labs Lab 02/25/14 1334 02/26/14 0445 02/27/14 0655  NA 135 135 137  K 4.6 4.0 4.0  CL 100 102 105  CO2 _0 GLUCOSE 156* 140* 132*  BUN _1 CREATININE 1.17 1.10 0.98  CALCIUM 10.3 8.7 8.7  MG  --  1.4* 2.1  PHOS  --  2.4  --    Liver Function Tests:  Recent Labs Lab 02/25/14 1334 02/26/14 0445 02/27/14 0655  AST 29 265* 67*  ALT 35 256* 123*  ALKPHOS 74 93 87  BILITOT 0.9 1.6* 1.4*  PROT 7.6 6.6 6.1  ALBUMIN  3.6 3.0* 2.7*    Recent Labs Lab 02/26/14 0445  LIPASE 66*   No results for input(s): AMMONIA in the last 168 hours. CBC:  Recent Labs Lab 02/25/14 1334 02/26/14 0445 02/27/14 0655  WBC 17.0* 13.0* 16.8*  NEUTROABS 12.3*  --   --   HGB 17.2* 15.4 14.8  HCT 50.9 46.5 44.1  MCV 92.5 93.2 92.6  PLT 188 173 162   Cardiac Enzymes: No results for input(s): CKTOTAL, CKMB,  CKMBINDEX, TROPONINI in the last 168 hours. BNP (last 3 results) No results for input(s): BNP in the last 8760 hours.  ProBNP (last 3 results) No results for input(s): PROBNP in the last 8760 hours.  CBG:  Recent Labs Lab 02/26/14 1720 02/26/14 2202 02/27/14 0005 02/27/14 0349 02/27/14 0800  GLUCAP 109* 106* 109* 122* 129*    Micro Recent Results (from the past 240 hour(s))  Culture, blood (routine x 2)     Status: None (Preliminary result)   Collection Time: 02/25/14  4:16 PM  Result Value Ref Range Status   Specimen Description BLOOD RIGHT ARM  Final   Special Requests BOTTLES DRAWN AEROBIC AND ANAEROBIC 5CC  Final   Culture   Final           BLOOD CULTURE RECEIVED NO GROWTH TO DATE CULTURE WILL BE HELD FOR 5 DAYS BEFORE ISSUING A FINAL NEGATIVE REPORT Performed at [facility]    Report Status PENDING  Incomplete  Urine culture     Status: None   Collection Time: 02/25/14  4:32 PM  Result Value Ref Range Status   Specimen Description URINE, RANDOM  Final   Special Requests NONE  Final   Colony Count   Final    2,000 COLONIES/ML Performed at [facility]    Culture   Final    INSIGNIFICANT GROWTH Performed at [facility]    Report Status 02/27/2014 FINAL  Final  Culture, blood (routine x 2)     Status: None (Preliminary result)   Collection Time: 02/25/14  4:35 PM  Result Value Ref Range Status   Specimen Description BLOOD RIGHT HAND  Final   Special Requests BOTTLES DRAWN AEROBIC AND ANAEROBIC 5CC  Final   Culture   Final           BLOOD CULTURE RECEIVED NO GROWTH TO DATE CULTURE WILL BE HELD FOR 5 DAYS BEFORE ISSUING A FINAL NEGATIVE REPORT Performed at [facility]    Report Status PENDING  Incomplete     Studies: Dg Chest 2 View  02/25/2014   CLINICAL DATA:  Abdominal pain and shortness of breath. Constipation. Right-sided epigastric pain. Slight cough.  EXAM: CHEST - 2 VIEW  COMPARISON:  None.  FINDINGS: The heart size  is normal. Emphysematous changes are present. No focal airspace disease is evident. Scarring at the lung bases is stable. Surgical clips are present at the GE junction.  IMPRESSION: 1. No acute cardiopulmonary disease. 2. Emphysema.   Electronically Signed   By: Lawrence Santiago M.D.   On: 02/25/2014 14:32   Ct Abdomen Pelvis W Contrast  02/25/2014   CLINICAL DATA:  Right lower quadrant pain x3 days, constipation, abdominal swelling. Prior appendectomy.  EXAM: CT ABDOMEN AND PELVIS WITH CONTRAST  TECHNIQUE: Multidetector CT imaging of the abdomen and pelvis was performed using the standard protocol following bolus administration of intravenous contrast.  CONTRAST:  140m OMNIPAQUE IOHEXOL 300 MG/ML  SOLN  COMPARISON:  None.  FINDINGS: Lower chest:  Emphysematous  changes at the lung bases.  Hepatobiliary: Liver is notable for a nodular hepatic contour, suggesting cirrhosis. No focal hepatic lesion is seen.  2.5 cm gallstone in the gallbladder neck. Associated gallbladder wall thickening with pericholecystic fluid/inflammatory changes.  Pancreas: Within normal limits.  Spleen: Calcified splenic granulomata.  Adrenals/Urinary Tract: Adrenal glands are within normal limits.  Kidneys are within normal limits.  No hydronephrosis.  Distended bladder with trabeculations.  Stomach/Bowel: Surgical clips at the GE junction.  Stomach is unremarkable.  Stable dilated loop of small bowel leading to a ventral hernia in the anterior mid abdomen (series 3/image 54), with a decompressed loop exiting the hernia, indicating some degree of partial obstruction. However, this appearance is chronic.  Prior appendectomy.  Sigmoid diverticulosis, with mild inflammatory changes involving the proximal sigmoid colon (series 3/image 64), compatible with mild sigmoid diverticulitis. No drainable fluid collection/abscess. No free air.  Vascular/Lymphatic: Atherosclerotic calcifications of the abdominal aorta and branch vessels.  No suspicious  abdominopelvic lymphadenopathy.  Reproductive: Prostate is grossly unremarkable.  Other: Postsurgical changes related to prior right inguinal hernia repair. Recurrent fat containing right inguinal hernia.  Small fat containing left inguinal hernia.  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  Musculoskeletal: Degenerative changes of the visualized thoracolumbar spine, most prominent at L4-5.  IMPRESSION: Cholelithiasis with suspected acute cholecystitis.  Suspected mild sigmoid diverticulitis. No drainable fluid collection/abscess. No free air.  Ventral hernia in the anterior mid abdominal wall with some degree of secondary partial small bowel bowel obstruction, chronic.  Numerous additional stable ancillary findings as above.   Electronically Signed   By: Julian Hy M.D.   On: 02/25/2014 17:27   Ir Perc Cholecystostomy  02/27/2014   INDICATION: Acute cholecystitis. Poor operative candidate given cardiac an respiratory compromise. Additionally, patient has concomitant episode of acute diverticulitis. Please perform ultrasound and fluoroscopic guided cholecystostomy tube placement.  EXAM: ULTRASOUND AND FLUOROSCOPIC-GUIDED CHOLECYSTOSTOMY TUBE PLACEMENT  COMPARISON:  Abdominal ultrasound - 02/25/2014  MEDICATIONS: Fentanyl 75 mcg IV; Versed 1.5 mg IV; The patient is currently admitted to the hospital and on intravenous antibiotics. Antibiotics were administered within an appropriate time frame prior to skin puncture.  ANESTHESIA/SEDATION: Total Moderate Sedation Time  20 minutes  CONTRAST:  72m OMNIPAQUE IOHEXOL 300 MG/ML SOLN - administered into the gallbladder lumen  FLUOROSCOPY TIME:  42 seconds (30 mGy)  COMPLICATIONS: None immediate.  PROCEDURE: Informed written consent was obtained from the patient after a discussion of the risks, benefits and alternatives to treatment. Questions  regarding the procedure were encouraged and answered. A timeout was performed prior to the initiation of the procedure.  The right upper abdominal quadrant was prepped and draped in the usual sterile fashion, and a sterile drape was applied covering the operative field. Maximum barrier sterile technique with sterile gowns and gloves were used for the procedure. A timeout was performed prior to the initiation of the procedure. Local anesthesia was provided with 1% lidocaine with epinephrine.  Ultrasound scanning of the right upper quadrant demonstrates a markedly dilated gallbladder. Of note, the patient reported pain with ultrasound imaging over the gallbladder. Utilizing a transhepatic approach, a 22 gauge needle was advanced into the gallbladder under direct ultrasound guidance. An ultrasound image was saved for documentation purposes. Appropriate intraluminal puncture was confirmed with the efflux of bile and advancement of an 0.018 wire into the gallbladder lumen. The needle was exchanged for an  Accustick set. A small amount of contrast was injected to confirm appropriate intraluminal positioning. Over a Benson wire, a 26.2-French Cook cholecystomy tube was advanced into the gallbladder fossa, coiled and locked. Bile was aspirated and a small amount of contrast was injected as several post procedural spot radiographic images were obtained in various obliquities. The catheter was secured to the skin with suture, connected to a drainage bag and a dressing was placed. The patient tolerated the procedure well without immediate post procedural complication.  IMPRESSION: Successful ultrasound and fluoroscopic guided placement of a 10.2 French cholecystostomy tube.   Electronically Signed   By: Sandi Mariscal M.D.   On: 02/27/2014 11:34    Scheduled Meds: . antiseptic oral rinse  7 mL Mouth Rinse BID  . docusate sodium  100 mg Oral BID  . fentaNYL      . insulin aspart  0-9 Units Subcutaneous 6 times per day  .  lidocaine-EPINEPHrine      . midazolam      . mometasone-formoterol  2 puff Inhalation BID  . piperacillin-tazobactam (ZOSYN)  IV  3.375 g Intravenous Q8H  . sodium chloride  3 mL Intravenous Q12H  . tiotropium  18 mcg Inhalation Daily   Continuous Infusions: . lactated ringers 50 mL/hr at 02/26/14 1605       Time spent: 35 minutes    [facility]  [facility] Pager [phone] If 7PM-7AM, please contact night-coverage at [email or web address], password [facility] 02/27/2014, 11:56 AM  LOS: 2 days

## 2014-02-27 NOTE — Procedures (Signed)
Successful [place] and fluoroscopic guided placement of 10 Fr cholecystotomy tube.  No immediate complications.

## 2014-02-27 NOTE — H&P (Signed)
Reason for Consult: Acute cholecystitis in setting of diverticulitis  Chief Complaint: Chief Complaint  Patient presents with  . Abdominal Pain  . Shortness of Breath  . Constipation   Referring Physician(s): CCS  History of Present Illness: Lance Powell is a 81 y.o. male who presented 2/5 with c/o LLQ pain for 2 days, he states after admission he started having RUQ pain. CT imaging revealed acute calculous cholecystitis and diverticulitis without perforation. The patient was placed on antibiotics and wbc continue to trend up. CCS has asked IR to evaluate given acute cholecystitis in setting of diverticulitis with wbc rising despite antibiotics. The patient denies any chest pain, shortness of breath or palpitations. He denies any active signs of bleeding or excessive bruising. The patient admits to history of sleep apnea and does use a CPAP. He has previously tolerated sedation without complications.   Past Medical History  Diagnosis Date  . Osteoarthritis   . Rheumatoid arthritis(714.0)   . Hyperlipemia   . Diabetes mellitus   . COPD (chronic obstructive pulmonary disease)   . Sleep apnea     cpap    Past Surgical History  Procedure Laterality Date  . Appendectomy  1969  . Nissen fundoplication    . Hernia repair  1990    right and left inguinal  . Vasectomy  1972  . Tonsillectomy  1937  . Transurethral resection of prostate  01/23/2011    Procedure: TRANSURETHRAL RESECTION OF THE PROSTATE WITH GYRUS INSTRUMENTS;  Surgeon: Molli Hazard, MD;  Location: [facility];  Service: Urology;  Laterality: N/A;  . Cystoscopy  01/23/2011    Procedure: CYSTOSCOPY;  Surgeon: Molli Hazard, MD;  Location: [facility];  Service: Urology;  Laterality: N/A;  . Flexible sigmoidoscopy  01/07/2012    Procedure: FLEXIBLE SIGMOIDOSCOPY;  Surgeon: Ladene Artist, MD,FACG;  Location: [facility];  Service: Endoscopy;  Laterality: N/A;  .  Sigmoidectomy  2004    colovesical fistula    Allergies: Review of patient's allergies indicates no known allergies.  Medications: Prior to Admission medications   Medication Sig Start Date End Date Taking? Authorizing Provider  albuterol (PROAIR HFA) 108 (90 BASE) MCG/ACT inhaler Inhale 2 puffs into the lungs every 6 (six) hours as needed for wheezing or shortness of breath. 02/25/11 02/25/14 Yes Tammy S Parrett, NP  aspirin 81 MG tablet Take 1 tablet (81 mg total) by mouth every morning. Stop for 1 week and then resume 01/08/12  Yes Barton Dubois, MD  atorvastatin (LIPITOR) 40 MG tablet Take 40 mg by mouth every morning.    Yes Historical Provider, MD  celecoxib (CELEBREX) 200 MG capsule Take 1 capsule (200 mg total) by mouth every evening. Hold for the next 5 days and then resume 01/08/12  Yes Barton Dubois, MD  cholecalciferol (VITAMIN D) 1000 UNITS tablet Take 2,000 Units by mouth 2 (two) times daily.    Yes Historical Provider, MD  enalapril (VASOTEC) 2.5 MG tablet Take 2.5 mg by mouth daily. 02/15/14  Yes Historical Provider, MD  Indacaterol Maleate (ARCAPTA NEOHALER) 75 MCG CAPS Place 1 capsule into inhaler and inhale every morning. 01/01/12  Yes Kathee Delton, MD  metFORMIN (GLUCOPHAGE) 500 MG tablet Take 500 mg by mouth 3 (three) times daily. 02/15/14  Yes Historical Provider, MD  misoprostol (CYTOTEC) 100 MCG tablet Take 100-200 mcg by mouth 4 (four) times daily. Takes 1 tab in am, 1 tab in pm, 2 tabs at bedtime   Yes  Historical Provider, MD  Multiple Vitamins-Minerals (CENTRUM) tablet Take 1 tablet by mouth daily.     Yes Historical Provider, MD  pantoprazole (PROTONIX) 40 MG tablet Take 1 tablet (40 mg total) by mouth daily. 01/08/12  Yes Barton Dubois, MD  PARoxetine (PAXIL) 20 MG tablet Take 20 mg by mouth every morning.     Yes Historical Provider, MD  vitamin C (ASCORBIC ACID) 500 MG tablet Take 500 mg by mouth daily.   Yes Historical Provider, MD  traMADol (ULTRAM) 50 MG tablet  Take 1 tablet (50 mg total) by mouth every 8 (eight) hours as needed for pain. 01/08/12   Barton Dubois, MD    Family History  Problem Relation Age of Onset  . Emphysema Brother   . Heart disease Mother   . Cancer Daughter     breast    History   Social History  . Marital Status: Married    Spouse Name: N/A    Number of Children: 6  . Years of Education: N/A   Occupational History  . Retired     Chief Financial Officer   Social History Main Topics  . Smoking status: Former Smoker -- 4.00 packs/day for 30 years    Types: Cigarettes    Quit date: 01/21/1978  . Smokeless tobacco: Never Used  . Alcohol Use: No  . Drug Use: No  . Sexual Activity: None   Other Topics Concern  . None   Social History Narrative   Review of Systems: A 12 point ROS discussed and pertinent positives are indicated in the HPI above.  All other systems are negative.  Review of Systems  Vital Signs: BP 110/63 mmHg  Pulse 99  Temp(Src) 98.9 F (37.2 C) (Oral)  Resp 18  Ht 6' (1.829 m)  Wt 245 lb 9.5 oz (111.4 kg)  BMI 33.30 kg/m2  SpO2 91%  Physical Exam  Constitutional: He is oriented to person, place, and time. No distress.  HENT:  Head: Normocephalic and atraumatic.  Neck: No tracheal deviation present.  Cardiovascular: Normal rate and regular rhythm.  Exam reveals no gallop and no friction rub.   No murmur heard. Abdominal: Soft. Bowel sounds are normal. There is tenderness.  RUQ TTP and LLQ TTP  Neurological: He is alert and oriented to person, place, and time.  Skin: He is not diaphoretic.   Imaging: Dg Chest 2 View  02/25/2014   CLINICAL DATA:  Abdominal pain and shortness of breath. Constipation. Right-sided epigastric pain. Slight cough.  EXAM: CHEST - 2 VIEW  COMPARISON:  None.  FINDINGS: The heart size is normal. Emphysematous changes are present. No focal airspace disease is evident. Scarring at the lung bases is stable. Surgical clips are present at the GE junction.  IMPRESSION: 1. No  acute cardiopulmonary disease. 2. Emphysema.   Electronically Signed   By: Lawrence Santiago M.D.   On: 02/25/2014 14:32   Ct Abdomen Pelvis W Contrast  02/25/2014   CLINICAL DATA:  Right lower quadrant pain x3 days, constipation, abdominal swelling. Prior appendectomy.  EXAM: CT ABDOMEN AND PELVIS WITH CONTRAST  TECHNIQUE: Multidetector CT imaging of the abdomen and pelvis was performed using the standard protocol following bolus administration of intravenous contrast.  CONTRAST:  143mL OMNIPAQUE IOHEXOL 300 MG/ML  SOLN  COMPARISON:  None.  FINDINGS: Lower chest:  Emphysematous changes at the lung bases.  Hepatobiliary: Liver is notable for a nodular hepatic contour, suggesting cirrhosis. No focal hepatic lesion is seen.  2.5 cm gallstone in the gallbladder  neck. Associated gallbladder wall thickening with pericholecystic fluid/inflammatory changes.  Pancreas: Within normal limits.  Spleen: Calcified splenic granulomata.  Adrenals/Urinary Tract: Adrenal glands are within normal limits.  Kidneys are within normal limits.  No hydronephrosis.  Distended bladder with trabeculations.  Stomach/Bowel: Surgical clips at the GE junction.  Stomach is unremarkable.  Stable dilated loop of small bowel leading to a ventral hernia in the anterior mid abdomen (series 3/image 54), with a decompressed loop exiting the hernia, indicating some degree of partial obstruction. However, this appearance is chronic.  Prior appendectomy.  Sigmoid diverticulosis, with mild inflammatory changes involving the proximal sigmoid colon (series 3/image 64), compatible with mild sigmoid diverticulitis. No drainable fluid collection/abscess. No free air.  Vascular/Lymphatic: Atherosclerotic calcifications of the abdominal aorta and branch vessels.  No suspicious abdominopelvic lymphadenopathy.  Reproductive: Prostate is grossly unremarkable.  Other: Postsurgical changes related to prior right inguinal hernia repair. Recurrent fat containing right  inguinal hernia.  Small fat containing left inguinal hernia.  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  Musculoskeletal: Degenerative changes of the visualized thoracolumbar spine, most prominent at L4-5.  IMPRESSION: Cholelithiasis with suspected acute cholecystitis.  Suspected mild sigmoid diverticulitis. No drainable fluid collection/abscess. No free air.  Ventral hernia in the anterior mid abdominal wall with some degree of secondary partial small bowel bowel obstruction, chronic.  Numerous additional stable ancillary findings as above.   Electronically Signed   By: Julian Hy M.D.   On: 02/25/2014 17:27    Labs:  CBC:  Recent Labs  02/25/14 1334 02/26/14 0445 02/27/14 0655  WBC 17.0* 13.0* 16.8*  HGB 17.2* 15.4 14.8  HCT 50.9 46.5 44.1  PLT 188 173 162    COAGS:  Recent Labs  02/26/14 0445  INR 1.23  APTT 35    BMP:  Recent Labs  02/25/14 1334 02/26/14 0445 02/27/14 0655  NA 135 135 137  K 4.6 4.0 4.0  CL 100 102 105  CO2 28 26 25   GLUCOSE 156* 140* 132*  BUN 13 11 13   CALCIUM 10.3 8.7 8.7  CREATININE 1.17 1.10 0.98  GFRNONAA 56* 61* 74*  GFRAA 65* 70* 86*    LIVER FUNCTION TESTS:  Recent Labs  02/25/14 1334 02/26/14 0445 02/27/14 0655  BILITOT 0.9 1.6* 1.4*  AST 29 265* 67*  ALT 35 256* 123*  ALKPHOS 74 93 87  PROT 7.6 6.6 6.1  ALBUMIN 3.6 3.0* 2.7*    Assessment and Plan: Acute calculous cholecystitis  Sigmoid diverticulitis, slowly improving  Request for image guided percutaneous cholecystostomy tube placement with moderate sedation Patient has been NPO, leukocytosis worsening even on zosyn, no blood thinners, labs reviewed Risks and Benefits discussed with the patient including but not limited to bleeding, perforation of GB, sepsis. All of the patient's questions were answered, patient is agreeable to  proceed. Perc cholecystostomy tube to stay in place for 6-8 weeks until interval cholecystectomy, plans per CCS Consent signed and in chart. OSA uses CPAP   Thank you for this interesting consult.  I greatly enjoyed meeting Lance Powell and look forward to participating in their care.  SignedHedy Jacob 02/27/2014, 10:01 AM   I spent a total of 40 minutes face to face in clinical consultation, greater than 50% of which was counseling/coordinating care

## 2014-02-28 DIAGNOSIS — K5669 Other intestinal obstruction: Secondary | ICD-10-CM

## 2014-02-28 DIAGNOSIS — R1011 Right upper quadrant pain: Secondary | ICD-10-CM

## 2014-02-28 DIAGNOSIS — K5732 Diverticulitis of large intestine without perforation or abscess without bleeding: Secondary | ICD-10-CM | POA: Diagnosis present

## 2014-02-28 DIAGNOSIS — R109 Unspecified abdominal pain: Secondary | ICD-10-CM | POA: Diagnosis present

## 2014-02-28 DIAGNOSIS — IMO0001 Reserved for inherently not codable concepts without codable children: Secondary | ICD-10-CM | POA: Diagnosis present

## 2014-02-28 LAB — COMPREHENSIVE METABOLIC PANEL
ALK PHOS: 84 U/L (ref 39–117)
ALT: 84 U/L — ABNORMAL HIGH (ref 0–53)
AST: 42 U/L — AB (ref 0–37)
Albumin: 2.4 g/dL — ABNORMAL LOW (ref 3.5–5.2)
Anion gap: 5 (ref 5–15)
BUN: 12 mg/dL (ref 6–23)
CALCIUM: 8.5 mg/dL (ref 8.4–10.5)
CO2: 27 mmol/L (ref 19–32)
CREATININE: 1.04 mg/dL (ref 0.50–1.35)
Chloride: 105 mmol/L (ref 96–112)
GFR calc non Af Amer: 65 mL/min — ABNORMAL LOW (ref >90)
GFR, EST AFRICAN AMERICAN: 75 mL/min — AB (ref >90)
Glucose, Bld: 129 mg/dL — ABNORMAL HIGH (ref 70–99)
Potassium: 3.7 mmol/L (ref 3.5–5.1)
SODIUM: 137 mmol/L (ref 135–145)
Total Bilirubin: 0.9 mg/dL (ref 0.3–1.2)
Total Protein: 6 g/dL (ref 6.0–8.3)

## 2014-02-28 LAB — CBC
HCT: 44.4 % (ref 39.0–52.0)
Hemoglobin: 14.9 g/dL (ref 13.0–17.0)
MCH: 31.6 pg (ref 26.0–34.0)
MCHC: 33.6 g/dL (ref 30.0–36.0)
MCV: 94.1 fL (ref 78.0–100.0)
Platelets: 180 10*3/uL (ref 150–400)
RBC: 4.72 MIL/uL (ref 4.22–5.81)
RDW: 14.7 % (ref 11.5–15.5)
WBC: 11 10*3/uL — ABNORMAL HIGH (ref 4.0–10.5)

## 2014-02-28 LAB — HEMOGLOBIN A1C
HEMOGLOBIN A1C: 6.8 % — AB (ref 4.8–5.6)
MEAN PLASMA GLUCOSE: 148 mg/dL

## 2014-02-28 LAB — GLUCOSE, CAPILLARY
GLUCOSE-CAPILLARY: 99 mg/dL (ref 70–99)
GLUCOSE-CAPILLARY: 121 mg/dL — AB (ref 70–99)

## 2014-02-28 NOTE — Progress Notes (Signed)
[facility] PROGRESS NOTE   Lance Powell NOB:[id number] DOB: 1929-06-12 DOA: 02/25/2014 PCP: Lauraine Rinne, MD  HPI/Subjective: Feels much better than yesterday, mild RUQ abdominal pain. Still has some pain in the left flank  Assessment/Plan: Active Problems:   DM type 2, not at goal   Obstructive sleep apnea   COPD (chronic obstructive pulmonary disease)   Diverticulitis   Urinary retention   Hypertension   SBO (small bowel obstruction)   Sepsis   Cholecystitis   Calculus of gallbladder with acute cholecystitis   Acute cholecystitis    Acute cholecystitis With evidence of sepsis, patient is started on IV Zosyn. General surgery consulted, s/p percutaneous cholecystostomy. Continue antibiotics for now, per general surgery cholecystectomy in 6-8 weeks. Feels much better, on clear liquids, diet advanced to full liquids, if tolerated can have soft diet in a.m. be discharged.  Sepsis Met sepsis criteria with respiratory rate of 27, heart rate of 112, WBCs of 17K and presence of infection on admission. IV fluids resuscitation was given. Started on IV Zosyn as sepsis causes probably acute cholecystitis/diverticulitis. Sepsis physiology resolved, patient improved.  Acute diverticulitis CT scan also showed mild diverticulitis and patient was symptomatic. Patient is on IV Zosyn that should cover it, also bowel rest. Will probably be discharged in a.m., likely on Augmentin for both diverticulitis and cholecystitis.  Deconditioning Patient seen by physical therapy was been in bed for the past 3-4 days. Recommended home health PT.  SBO Patient does have hernia, with chronic loops inside it, likely this is not acute.  COPD Continue nebulizers, denies any complaints.  Hypertension Enalapril held, I will restart on discharge.    Code Status: Full Code Family Communication: Plan discussed with the patient. Disposition Plan: Remains inpatient Diet: Diet full  liquid  Consultants:  Gen. surgery  Procedures:  None  Antibiotics:  Zosyn   Objective: Filed Vitals:   02/28/14 1405  BP: 107/70  Pulse: 63  Temp: 98 F (36.7 C)  Resp: 18    Intake/Output Summary (Last 24 hours) at 02/28/14 1604 Last data filed at 02/28/14 1528  Gross per 24 hour  Intake    930 ml  Output   2600 ml  Net  -1670 ml   Filed Weights   02/25/14 1328 02/25/14 2045  Weight: 112.492 kg (248 lb) 111.4 kg (245 lb 9.5 oz)    Exam: General: Alert and awake, oriented x3, not in any acute distress. HEENT: anicteric sclera, pupils reactive to light and accommodation, EOMI CVS: S1-S2 clear, no murmur rubs or gallops Chest: clear to auscultation bilaterally, no wheezing, rales or rhonchi Abdomen: soft nontender, nondistended, normal bowel sounds, no organomegaly Extremities: no cyanosis, clubbing or edema noted bilaterally Neuro: Cranial nerves II-XII intact, no focal neurological deficits  Data Reviewed: Basic Metabolic Panel:  Recent Labs Lab 02/25/14 1334 02/26/14 0445 02/27/14 0655 02/28/14 0745  NA 135 135 137 137  K 4.6 4.0 4.0 3.7  CL 100 102 105 105  CO2 '28 26 25 27  ' GLUCOSE 156* 140* 132* 129*  BUN '13 11 13 12  ' CREATININE 1.17 1.10 0.98 1.04  CALCIUM 10.3 8.7 8.7 8.5  MG  --  1.4* 2.1  --   PHOS  --  2.4  --   --    Liver Function Tests:  Recent Labs Lab 02/25/14 1334 02/26/14 0445 02/27/14 0655 02/28/14 0745  AST 29 265* 67* 42*  ALT 35 256* 123* 84*  ALKPHOS 74 93 87 84  BILITOT 0.9 1.6*  1.4* 0.9  PROT 7.6 6.6 6.1 6.0  ALBUMIN 3.6 3.0* 2.7* 2.4*    Recent Labs Lab 02/26/14 0445  LIPASE 66*   No results for input(s): AMMONIA in the last 168 hours. CBC:  Recent Labs Lab 02/25/14 1334 02/26/14 0445 02/27/14 0655 02/28/14 0745  WBC 17.0* 13.0* 16.8* 11.0*  NEUTROABS 12.3*  --   --   --   HGB 17.2* 15.4 14.8 14.9  HCT 50.9 46.5 44.1 44.4  MCV 92.5 93.2 92.6 94.1  PLT 188 173 162 180   Cardiac Enzymes: No  results for input(s): CKTOTAL, CKMB, CKMBINDEX, TROPONINI in the last 168 hours. BNP (last 3 results) No results for input(s): BNP in the last 8760 hours.  ProBNP (last 3 results) No results for input(s): PROBNP in the last 8760 hours.  CBG:  Recent Labs Lab 02/27/14 1201 02/27/14 1717 02/27/14 2036 02/28/14 0122 02/28/14 0412  GLUCAP 131* 168* 135* 99 121*    Micro Recent Results (from the past 240 hour(s))  Culture, blood (routine x 2)     Status: None (Preliminary result)   Collection Time: 02/25/14  4:16 PM  Result Value Ref Range Status   Specimen Description BLOOD RIGHT ARM  Final   Special Requests BOTTLES DRAWN AEROBIC AND ANAEROBIC 5CC  Final   Culture   Final           BLOOD CULTURE RECEIVED NO GROWTH TO DATE CULTURE WILL BE HELD FOR 5 DAYS BEFORE ISSUING A FINAL NEGATIVE REPORT Performed at [facility]    Report Status PENDING  Incomplete  Urine culture     Status: None   Collection Time: 02/25/14  4:32 PM  Result Value Ref Range Status   Specimen Description URINE, RANDOM  Final   Special Requests NONE  Final   Colony Count   Final    2,000 COLONIES/ML Performed at [facility]    Culture   Final    INSIGNIFICANT GROWTH Performed at [facility]    Report Status 02/27/2014 FINAL  Final  Culture, blood (routine x 2)     Status: None (Preliminary result)   Collection Time: 02/25/14  4:35 PM  Result Value Ref Range Status   Specimen Description BLOOD RIGHT HAND  Final   Special Requests BOTTLES DRAWN AEROBIC AND ANAEROBIC 5CC  Final   Culture   Final           BLOOD CULTURE RECEIVED NO GROWTH TO DATE CULTURE WILL BE HELD FOR 5 DAYS BEFORE ISSUING A FINAL NEGATIVE REPORT Performed at [facility]    Report Status PENDING  Incomplete     Studies: Ir Perc Cholecystostomy  02/27/2014   INDICATION: Acute cholecystitis. Poor operative candidate given cardiac an respiratory compromise. Additionally, patient has concomitant  episode of acute diverticulitis. Please perform ultrasound and fluoroscopic guided cholecystostomy tube placement.  EXAM: ULTRASOUND AND FLUOROSCOPIC-GUIDED CHOLECYSTOSTOMY TUBE PLACEMENT  COMPARISON:  Abdominal ultrasound - 02/25/2014  MEDICATIONS: Fentanyl 75 mcg IV; Versed 1.5 mg IV; The patient is currently admitted to the hospital and on intravenous antibiotics. Antibiotics were administered within an appropriate time frame prior to skin puncture.  ANESTHESIA/SEDATION: Total Moderate Sedation Time  20 minutes  CONTRAST:  83m OMNIPAQUE IOHEXOL 300 MG/ML SOLN - administered into the gallbladder lumen  FLUOROSCOPY TIME:  42 seconds (30 mGy)  COMPLICATIONS: None immediate.  PROCEDURE: Informed written consent was obtained from the patient after a discussion of the risks, benefits and alternatives to treatment. Questions regarding the  procedure were encouraged and answered. A timeout was performed prior to the initiation of the procedure.  The right upper abdominal quadrant was prepped and draped in the usual sterile fashion, and a sterile drape was applied covering the operative field. Maximum barrier sterile technique with sterile gowns and gloves were used for the procedure. A timeout was performed prior to the initiation of the procedure. Local anesthesia was provided with 1% lidocaine with epinephrine.  Ultrasound scanning of the right upper quadrant demonstrates a markedly dilated gallbladder. Of note, the patient reported pain with ultrasound imaging over the gallbladder. Utilizing a transhepatic approach, a 22 gauge needle was advanced into the gallbladder under direct ultrasound guidance. An ultrasound image was saved for documentation purposes. Appropriate intraluminal puncture was confirmed with the efflux of bile and advancement of an 0.018 wire into the gallbladder lumen. The needle was exchanged for an [place] set. A small amount of contrast was injected to confirm appropriate intraluminal  positioning. Over a Benson wire, a 58.2-French Cook cholecystomy tube was advanced into the gallbladder fossa, coiled and locked. Bile was aspirated and a small amount of contrast was injected as several post procedural spot radiographic images were obtained in various obliquities. The catheter was secured to the skin with suture, connected to a drainage bag and a dressing was placed. The patient tolerated the procedure well without immediate post procedural complication.  IMPRESSION: Successful ultrasound and fluoroscopic guided placement of a 10.2 French cholecystostomy tube.   Electronically Signed   By: Sandi Mariscal M.D.   On: 02/27/2014 11:34    Scheduled Meds: . antiseptic oral rinse  7 mL Mouth Rinse BID  . docusate sodium  100 mg Oral BID  . insulin aspart  0-9 Units Subcutaneous 6 times per day  . mometasone-formoterol  2 puff Inhalation BID  . piperacillin-tazobactam (ZOSYN)  IV  3.375 g Intravenous Q8H  . sodium chloride  3 mL Intravenous Q12H  . tiotropium  18 mcg Inhalation Daily   Continuous Infusions: . lactated ringers 1,000 mL (02/28/14 0935)       Time spent: 35 minutes    [facility]  [facility] Pager [phone] If 7PM-7AM, please contact night-coverage at [email or web address], password [facility] 02/28/2014, 4:04 PM  LOS: 3 days

## 2014-02-28 NOTE — Progress Notes (Signed)
Referring Physician(s): CCS  Subjective:  Perc Chole drain placed 2/7 Sleeping No complaints Feels better  Allergies: Review of patient's allergies indicates no known allergies.  Medications: Prior to Admission medications   Medication Sig Start Date End Date Taking? Authorizing Provider  albuterol (PROAIR HFA) 108 (90 BASE) MCG/ACT inhaler Inhale 2 puffs into the lungs every 6 (six) hours as needed for wheezing or shortness of breath. 02/25/11 02/25/14 Yes Tammy S Parrett, NP  aspirin 81 MG tablet Take 1 tablet (81 mg total) by mouth every morning. Stop for 1 week and then resume 01/08/12  Yes Barton Dubois, MD  atorvastatin (LIPITOR) 40 MG tablet Take 40 mg by mouth every morning.    Yes Historical Provider, MD  celecoxib (CELEBREX) 200 MG capsule Take 1 capsule (200 mg total) by mouth every evening. Hold for the next 5 days and then resume 01/08/12  Yes Barton Dubois, MD  cholecalciferol (VITAMIN D) 1000 UNITS tablet Take 2,000 Units by mouth 2 (two) times daily.    Yes Historical Provider, MD  enalapril (VASOTEC) 2.5 MG tablet Take 2.5 mg by mouth daily. 02/15/14  Yes Historical Provider, MD  Indacaterol Maleate (ARCAPTA NEOHALER) 75 MCG CAPS Place 1 capsule into inhaler and inhale every morning. 01/01/12  Yes Kathee Delton, MD  metFORMIN (GLUCOPHAGE) 500 MG tablet Take 500 mg by mouth 3 (three) times daily. 02/15/14  Yes Historical Provider, MD  misoprostol (CYTOTEC) 100 MCG tablet Take 100-200 mcg by mouth 4 (four) times daily. Takes 1 tab in am, 1 tab in pm, 2 tabs at bedtime   Yes Historical Provider, MD  Multiple Vitamins-Minerals (CENTRUM) tablet Take 1 tablet by mouth daily.     Yes Historical Provider, MD  pantoprazole (PROTONIX) 40 MG tablet Take 1 tablet (40 mg total) by mouth daily. 01/08/12  Yes Barton Dubois, MD  PARoxetine (PAXIL) 20 MG tablet Take 20 mg by mouth every morning.     Yes Historical Provider, MD  vitamin C (ASCORBIC ACID) 500 MG tablet Take 500 mg by mouth  daily.   Yes Historical Provider, MD  traMADol (ULTRAM) 50 MG tablet Take 1 tablet (50 mg total) by mouth every 8 (eight) hours as needed for pain. 01/08/12   Barton Dubois, MD    Review of Systems  Vital Signs: BP 119/41 mmHg  Pulse 85  Temp(Src) 97.8 F (36.6 C) (Oral)  Resp 18  Ht 6' (1.829 m)  Wt 111.4 kg (245 lb 9.5 oz)  BMI 33.30 kg/m2  SpO2 92%  Physical Exam  Abdominal:  Site of chole drain clean and dry Sl tender No bleeding Output bloody bile: 300 cc yesterday 50 cc in bag Afeb; wbc 11 No cx noted   Nursing note and vitals reviewed.   Imaging: Dg Chest 2 View  02/25/2014   CLINICAL DATA:  Abdominal pain and shortness of breath. Constipation. Right-sided epigastric pain. Slight cough.  EXAM: CHEST - 2 VIEW  COMPARISON:  None.  FINDINGS: The heart size is normal. Emphysematous changes are present. No focal airspace disease is evident. Scarring at the lung bases is stable. Surgical clips are present at the GE junction.  IMPRESSION: 1. No acute cardiopulmonary disease. 2. Emphysema.   Electronically Signed   By: Lawrence Santiago M.D.   On: 02/25/2014 14:32   Ct Abdomen Pelvis W Contrast  02/25/2014   CLINICAL DATA:  Right lower quadrant pain x3 days, constipation, abdominal swelling. Prior appendectomy.  EXAM: CT ABDOMEN AND PELVIS WITH CONTRAST  TECHNIQUE:  Multidetector CT imaging of the abdomen and pelvis was performed using the standard protocol following bolus administration of intravenous contrast.  CONTRAST:  148mL OMNIPAQUE IOHEXOL 300 MG/ML  SOLN  COMPARISON:  None.  FINDINGS: Lower chest:  Emphysematous changes at the lung bases.  Hepatobiliary: Liver is notable for a nodular hepatic contour, suggesting cirrhosis. No focal hepatic lesion is seen.  2.5 cm gallstone in the gallbladder neck. Associated gallbladder wall thickening with pericholecystic fluid/inflammatory changes.  Pancreas: Within normal limits.  Spleen: Calcified splenic granulomata.  Adrenals/Urinary Tract:  Adrenal glands are within normal limits.  Kidneys are within normal limits.  No hydronephrosis.  Distended bladder with trabeculations.  Stomach/Bowel: Surgical clips at the GE junction.  Stomach is unremarkable.  Stable dilated loop of small bowel leading to a ventral hernia in the anterior mid abdomen (series 3/image 54), with a decompressed loop exiting the hernia, indicating some degree of partial obstruction. However, this appearance is chronic.  Prior appendectomy.  Sigmoid diverticulosis, with mild inflammatory changes involving the proximal sigmoid colon (series 3/image 64), compatible with mild sigmoid diverticulitis. No drainable fluid collection/abscess. No free air.  Vascular/Lymphatic: Atherosclerotic calcifications of the abdominal aorta and branch vessels.  No suspicious abdominopelvic lymphadenopathy.  Reproductive: Prostate is grossly unremarkable.  Other: Postsurgical changes related to prior right inguinal hernia repair. Recurrent fat containing right inguinal hernia.  Small fat containing left inguinal hernia.  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  Musculoskeletal: Degenerative changes of the visualized thoracolumbar spine, most prominent at L4-5.  IMPRESSION: Cholelithiasis with suspected acute cholecystitis.  Suspected mild sigmoid diverticulitis. No drainable fluid collection/abscess. No free air.  Ventral hernia in the anterior mid abdominal wall with some degree of secondary partial small bowel bowel obstruction, chronic.  Numerous additional stable ancillary findings as above.   Electronically Signed   By: Julian Hy M.D.   On: 02/25/2014 17:27   Ir Perc Cholecystostomy  02/27/2014   INDICATION: Acute cholecystitis. Poor operative candidate given cardiac an respiratory compromise. Additionally, patient has concomitant episode of acute diverticulitis. Please  perform ultrasound and fluoroscopic guided cholecystostomy tube placement.  EXAM: ULTRASOUND AND FLUOROSCOPIC-GUIDED CHOLECYSTOSTOMY TUBE PLACEMENT  COMPARISON:  Abdominal ultrasound - 02/25/2014  MEDICATIONS: Fentanyl 75 mcg IV; Versed 1.5 mg IV; The patient is currently admitted to the hospital and on intravenous antibiotics. Antibiotics were administered within an appropriate time frame prior to skin puncture.  ANESTHESIA/SEDATION: Total Moderate Sedation Time  20 minutes  CONTRAST:  37mL OMNIPAQUE IOHEXOL 300 MG/ML SOLN - administered into the gallbladder lumen  FLUOROSCOPY TIME:  42 seconds (30 mGy)  COMPLICATIONS: None immediate.  PROCEDURE: Informed written consent was obtained from the patient after a discussion of the risks, benefits and alternatives to treatment. Questions regarding the procedure were encouraged and answered. A timeout was performed prior to the initiation of the procedure.  The right upper abdominal quadrant was prepped and draped in the usual sterile fashion, and a sterile drape was applied covering the operative field. Maximum barrier sterile technique with sterile gowns and gloves were used for the procedure. A timeout was performed prior to the initiation of the procedure. Local anesthesia was provided with 1% lidocaine with epinephrine.  Ultrasound scanning of the right upper quadrant demonstrates a markedly dilated gallbladder. Of note, the patient reported pain with ultrasound imaging over the gallbladder. Utilizing a transhepatic approach, a 22 gauge needle was advanced into  the gallbladder under direct ultrasound guidance. An ultrasound image was saved for documentation purposes. Appropriate intraluminal puncture was confirmed with the efflux of bile and advancement of an 0.018 wire into the gallbladder lumen. The needle was exchanged for an [place] set. A small amount of contrast was injected to confirm appropriate intraluminal positioning. Over a Benson wire, a 71.2-French  Cook cholecystomy tube was advanced into the gallbladder fossa, coiled and locked. Bile was aspirated and a small amount of contrast was injected as several post procedural spot radiographic images were obtained in various obliquities. The catheter was secured to the skin with suture, connected to a drainage bag and a dressing was placed. The patient tolerated the procedure well without immediate post procedural complication.  IMPRESSION: Successful ultrasound and fluoroscopic guided placement of a 10.2 French cholecystostomy tube.   Electronically Signed   By: Sandi Mariscal M.D.   On: 02/27/2014 11:34    Labs:  CBC:  Recent Labs  02/25/14 1334 02/26/14 0445 02/27/14 0655 02/28/14 0745  WBC 17.0* 13.0* 16.8* 11.0*  HGB 17.2* 15.4 14.8 14.9  HCT 50.9 46.5 44.1 44.4  PLT 188 173 162 180    COAGS:  Recent Labs  02/26/14 0445  INR 1.23  APTT 35    BMP:  Recent Labs  02/25/14 1334 02/26/14 0445 02/27/14 0655 02/28/14 0745  NA 135 135 137 137  K 4.6 4.0 4.0 3.7  CL 100 102 105 105  CO2 28 26 25 27   GLUCOSE 156* 140* 132* 129*  BUN 13 11 13 12   CALCIUM 10.3 8.7 8.7 8.5  CREATININE 1.17 1.10 0.98 1.04  GFRNONAA 56* 61* 74* 65*  GFRAA 65* 70* 86* 75*    LIVER FUNCTION TESTS:  Recent Labs  02/25/14 1334 02/26/14 0445 02/27/14 0655 02/28/14 0745  BILITOT 0.9 1.6* 1.4* 0.9  AST 29 265* 67* 42*  ALT 35 256* 123* 84*  ALKPHOS 74 93 87 84  PROT 7.6 6.6 6.1 6.0  ALBUMIN 3.6 3.0* 2.7* 2.4*    Assessment and Plan:  Perc chole drain placed 2/7 in IR Doing well Output bloody bile Will need drain in place 6-8 weeks Home care is to flush once daily Plan per CCS     I spent a total of 15 minutes face to face in clinical consultation/evaluation, greater than 50% of which was counseling/coordinating care for percutaneous cholecystostomy drain  Signed: Mychael Soots A 02/28/2014, 10:24 AM

## 2014-02-28 NOTE — Progress Notes (Signed)
ANTIBIOTIC CONSULT NOTE - FOLLOW UP  Pharmacy Consult for Zosyn Indication: Abd coverage (diverticulitits/cholecystitis)  No Known Allergies  Patient Measurements: Height: 6' (182.9 cm) Weight: 245 lb 9.5 oz (111.4 kg) IBW/kg (Calculated) : 77.6  Vital Signs: Temp: 97.8 F (36.6 C) (02/08 0159) Temp Source: Oral (02/08 0159) BP: 111/72 mmHg (02/08 1127) Pulse Rate: 86 (02/08 1127) Intake/Output from previous day: 02/07 0701 - 02/08 0700 In: 35 [P.O.:450] Out: 1100 [Urine:800; Drains:300] Intake/Output from this shift: Total I/O In: 240 [P.O.:240] Out: 650 [Urine:650]  Labs:  Recent Labs  02/26/14 0445 02/27/14 0655 02/28/14 0745  WBC 13.0* 16.8* 11.0*  HGB 15.4 14.8 14.9  PLT 173 162 180  CREATININE 1.10 0.98 1.04   Estimated Creatinine Clearance: 70.6 mL/min (by C-G formula based on Cr of 1.04). No results for input(s): VANCOTROUGH, VANCOPEAK, VANCORANDOM, GENTTROUGH, GENTPEAK, GENTRANDOM, TOBRATROUGH, TOBRAPEAK, TOBRARND, AMIKACINPEAK, AMIKACINTROU, AMIKACIN in the last 72 hours.   Microbiology: Recent Results (from the past 720 hour(s))  Culture, blood (routine x 2)     Status: None (Preliminary result)   Collection Time: 02/25/14  4:16 PM  Result Value Ref Range Status   Specimen Description BLOOD RIGHT ARM  Final   Special Requests BOTTLES DRAWN AEROBIC AND ANAEROBIC 5CC  Final   Culture   Final           BLOOD CULTURE RECEIVED NO GROWTH TO DATE CULTURE WILL BE HELD FOR 5 DAYS BEFORE ISSUING A FINAL NEGATIVE REPORT Performed at [facility]    Report Status PENDING  Incomplete  Urine culture     Status: None   Collection Time: 02/25/14  4:32 PM  Result Value Ref Range Status   Specimen Description URINE, RANDOM  Final   Special Requests NONE  Final   Colony Count   Final    2,000 COLONIES/ML Performed at [facility]    Culture   Final    INSIGNIFICANT GROWTH Performed at [facility]    Report Status 02/27/2014 FINAL   Final  Culture, blood (routine x 2)     Status: None (Preliminary result)   Collection Time: 02/25/14  4:35 PM  Result Value Ref Range Status   Specimen Description BLOOD RIGHT HAND  Final   Special Requests BOTTLES DRAWN AEROBIC AND ANAEROBIC 5CC  Final   Culture   Final           BLOOD CULTURE RECEIVED NO GROWTH TO DATE CULTURE WILL BE HELD FOR 5 DAYS BEFORE ISSUING A FINAL NEGATIVE REPORT Performed at [facility]    Report Status PENDING  Incomplete    Anti-infectives    Start     Dose/Rate Route Frequency Ordered Stop   02/26/14 2000  vancomycin (VANCOCIN) 1,250 mg in sodium chloride 0.9 % 250 mL IVPB  Status:  Discontinued     1,250 mg166.7 mL/hr over 90 Minutes Intravenous Every 24 hours 02/25/14 1735 02/25/14 1839   02/26/14 1800  vancomycin (VANCOCIN) 1,250 mg in sodium chloride 0.9 % 250 mL IVPB  Status:  Discontinued     1,250 mg166.7 mL/hr over 90 Minutes Intravenous Every 24 hours 02/25/14 1732 02/25/14 1735   02/26/14 0000  piperacillin-tazobactam (ZOSYN) IVPB 3.375 g     3.375 g12.5 mL/hr over 240 Minutes Intravenous Every 8 hours 02/25/14 1732     02/25/14 1815  metroNIDAZOLE (FLAGYL) IVPB 500 mg  Status:  Discontinued     500 mg100 mL/hr over 60 Minutes Intravenous  Once 02/25/14 1811 02/25/14 1836  02/25/14 1700  vancomycin (VANCOCIN) 2,500 mg in sodium chloride 0.9 % 500 mL IVPB  Status:  Discontinued     2,500 mg250 mL/hr over 120 Minutes Intravenous  Once 02/25/14 1647 02/25/14 1959   02/25/14 1645  piperacillin-tazobactam (ZOSYN) IVPB 3.375 g     3.375 g100 mL/hr over 30 Minutes Intravenous  Once 02/25/14 1644 02/25/14 1841   02/25/14 1645  vancomycin (VANCOCIN) IVPB 1000 mg/200 mL premix  Status:  Discontinued     1,000 mg200 mL/hr over 60 Minutes Intravenous  Once 02/25/14 1644 02/25/14 1647      Assessment: 50 YOM who continues on Zosyn for empiric abdominal coverage in the setting of cholecystitis and diverticulitis. Cholecystitis is improving now  that the patient had a drain placed by IR. Diverticulitis is also noted to be resolving as the patient has been transitioned to a full liquid diet. Renal function stable - Zosyn dose remains appropriate.   Now that the patient is tolerating po intake - consider resuming some of the patient's home medications. Home meds not yet resumed include: Aspirin 81 mg, atorvastatin 40 mg daily, enalapril 2.5 mg daily, metformin 500 mg 3x/day, paroxetine 20 mg daily.  Goal of Therapy:  Proper antibiotics for infection/cultures adjusted for renal/hepatic function   Plan:  1. Continue Zosyn 3.375g IV every 8 hours (infused over 4 hours) 2. Consider resuming home medications as the patient can tolerate 3. Will continue to follow renal function, culture results, LOT, and antibiotic de-escalation plans   Alycia Rossetti, PharmD, BCPS Clinical Pharmacist Pager: [phone] 02/28/2014 11:54 AM

## 2014-02-28 NOTE — Evaluation (Signed)
Physical Therapy Evaluation Patient Details Name: Lance Powell MRN: [id number] DOB: Aug 12, 1929 Today's Date: 02/28/2014   History of Present Illness  81 y/o male admitted with acute calculous cholecystitis and diverticulitis without perforation. Pt s/p placement of cholecystotomy tube.  Clinical Impression  Pt admitted with above diagnosis. Pt currently with functional limitations due to the deficits listed below (see PT Problem List). Pt will benefit from skilled PT to increase their independence and safety with mobility to allow discharge to the venue listed below.  Pt moving well with RW after initial unsteadiness without AD.  Will try pt with cane tomorrow.  He does not feel he needs HHPT but depending on how he does with cane tomorrow he may need it.  Pt said he would think about it. 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     Follow Up Recommendations Home health PT    Equipment Recommendations  Other (comment) (TBD)    Recommendations for Other Services       Precautions / Restrictions Precautions Precautions: None      Mobility  Bed Mobility               General bed mobility comments: sitting EOB upon arrival. pt without o2 on and 79%.  Re-applied o2 and increased to 91% quickly.  Transfers Overall transfer level: Needs assistance   Transfers: Sit to/from Stand Sit to Stand: Supervision            Ambulation/Gait Ambulation/Gait assistance: Supervision;Min guard Ambulation Distance (Feet): 175 Feet Assistive device: Rolling walker (2 wheeled) Gait Pattern/deviations: Step-through pattern     General Gait Details: Stood initially without RW and seemed a bit unsteady so used RW with gait.  Pt steady with RW, but would like to try a cane tomorrow.   Stairs            Wheelchair Mobility    Modified Rankin (Stroke Patients Only)        Balance Overall balance assessment: Needs assistance Sitting-balance support: Feet supported;No upper extremity supported Sitting balance-Leahy Scale: Good       Standing balance-Leahy Scale: Fair                               Pertinent Vitals/Pain Pain Assessment: No/denies pain  o2 on RA 79%  At 2 L/min 91% and after gait 97%on 2 L/min.    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 His daughter has been staying with them the last few months to assist.     Hand Dominance        Extremity/Trunk Assessment   Upper Extremity Assessment: Defer to OT evaluation           Lower Extremity Assessment: Overall WFL for tasks assessed      Cervical / Trunk Assessment: Normal  Communication      Cognition Arousal/Alertness: Awake/alert Behavior During Therapy: WFL for tasks assessed/performed Overall Cognitive Status: Within Functional Limits for tasks  assessed                      General Comments General comments (skin integrity, edema, etc.): Pt joking throughout session.  At times needed cues to stay on task.    Exercises        Assessment/Plan    PT Assessment Patient needs continued PT services  PT Diagnosis Difficulty walking   PT Problem List Decreased activity tolerance;Decreased mobility;Decreased knowledge of use of DME  PT Treatment Interventions Gait training;Functional mobility training;Therapeutic activities;Therapeutic exercise;Patient/family education   PT Goals (Current goals can be found in the Care Plan section) Acute Rehab PT  Goals Patient Stated Goal: Get home to wife PT Goal Formulation: With patient Time For Goal Achievement: 03/07/14 Potential to Achieve Goals: Good    Frequency Min 3X/week   Barriers to discharge        Co-evaluation               End of Session Equipment Utilized During Treatment: Gait belt;Oxygen Activity Tolerance: Patient tolerated treatment well Patient left: in chair;with call bell/phone within reach Nurse Communication: Mobility status         Time: 0211-1552 PT Time Calculation (min) (ACUTE ONLY): 43 min   Charges:   PT Evaluation $Initial PT Evaluation Tier I: 1 Procedure PT Treatments $Gait Training: 8-22 mins $Therapeutic Activity: 8-22 mins   PT G Codes:       Mirka Barbone L. Tamala Julian, [place] Pager [phone] 02/28/2014  Lance Powell 02/28/2014, 3:01 PM

## 2014-02-28 NOTE — Progress Notes (Signed)
Medicare Important Message given?  YES (If response is "NO", the following Medicare IM given date fields will be blank) Date Medicare IM given:  02/28/14 Medicare IM given by:  Erick Murin 

## 2014-02-28 NOTE — Progress Notes (Signed)
[facility] Progress Note     Subjective: Pt feels much better, still has pain, but significantly improved.  WBC improving.  Diverticulitis doesn't hurt him anymore.  No N/V, tolerating liquids well.  Would like better food.  Objective: Vital signs in last 24 hours: Temp:  [97.8 F (36.6 C)-98.1 F (36.7 C)] 97.8 F (36.6 C) (02/08 0159) Pulse Rate:  [85-103] 85 (02/08 0718) Resp:  [16-22] 18 (02/08 0718) BP: (108-137)/(41-84) 119/41 mmHg (02/08 0718) SpO2:  [90 %-94 %] 90 % (02/08 0718) Last BM Date: 02/25/14  Intake/Output from previous day: 02/07 0701 - 02/08 0700 In: 35 [P.O.:450] Out: 1100 [Urine:800; Drains:300] Intake/Output this shift:    PE: Gen:  Alert, NAD, pleasant Abd: Soft, obese, ND, tender in RUQ but less, +BS, no HSM, extensive abdominal scars noted   Lab Results:   Recent Labs  02/26/14 0445 02/27/14 0655  WBC 13.0* 16.8*  HGB 15.4 14.8  HCT 46.5 44.1  PLT 173 162   BMET  Recent Labs  02/26/14 0445 02/27/14 0655  NA 135 137  K 4.0 4.0  CL 102 105  CO2 26 25  GLUCOSE 140* 132*  BUN 11 13  CREATININE 1.10 0.98  CALCIUM 8.7 8.7   PT/INR  Recent Labs  02/26/14 0445  LABPROT 15.6*  INR 1.23   CMP     Component Value Date/Time   NA 137 02/27/2014 0655   K 4.0 02/27/2014 0655   CL 105 02/27/2014 0655   CO2 25 02/27/2014 0655   GLUCOSE 132* 02/27/2014 0655   BUN 13 02/27/2014 0655   CREATININE 0.98 02/27/2014 0655   CALCIUM 8.7 02/27/2014 0655   PROT 6.1 02/27/2014 0655   ALBUMIN 2.7* 02/27/2014 0655   AST 67* 02/27/2014 0655   ALT 123* 02/27/2014 0655   ALKPHOS 87 02/27/2014 0655   BILITOT 1.4* 02/27/2014 0655   GFRNONAA 74* 02/27/2014 0655   GFRAA 86* 02/27/2014 0655   Lipase     Component Value Date/Time   LIPASE 66* 02/26/2014 0445       Studies/Results: Ir Perc Cholecystostomy  02/27/2014   INDICATION: Acute cholecystitis. Poor operative candidate given cardiac an respiratory compromise.  Additionally, patient has concomitant episode of acute diverticulitis. Please perform ultrasound and fluoroscopic guided cholecystostomy tube placement.  EXAM: ULTRASOUND AND FLUOROSCOPIC-GUIDED CHOLECYSTOSTOMY TUBE PLACEMENT  COMPARISON:  Abdominal ultrasound - 02/25/2014  MEDICATIONS: Fentanyl 75 mcg IV; Versed 1.5 mg IV; The patient is currently admitted to the hospital and on intravenous antibiotics. Antibiotics were administered within an appropriate time frame prior to skin puncture.  ANESTHESIA/SEDATION: Total Moderate Sedation Time  20 minutes  CONTRAST:  26mL OMNIPAQUE IOHEXOL 300 MG/ML SOLN - administered into the gallbladder lumen  FLUOROSCOPY TIME:  42 seconds (30 mGy)  COMPLICATIONS: None immediate.  PROCEDURE: Informed written consent was obtained from the patient after a discussion of the risks, benefits and alternatives to treatment. Questions regarding the procedure were encouraged and answered. A timeout was performed prior to the initiation of the procedure.  The right upper abdominal quadrant was prepped and draped in the usual sterile fashion, and a sterile drape was applied covering the operative field. Maximum barrier sterile technique with sterile gowns and gloves were used for the procedure. A timeout was performed prior to the initiation of the procedure. Local anesthesia was provided with 1% lidocaine with epinephrine.  Ultrasound scanning of the right upper quadrant demonstrates a markedly dilated gallbladder. Of note, the patient reported pain with ultrasound imaging over the  gallbladder. Utilizing a transhepatic approach, a 22 gauge needle was advanced into the gallbladder under direct ultrasound guidance. An ultrasound image was saved for documentation purposes. Appropriate intraluminal puncture was confirmed with the efflux of bile and advancement of an 0.018 wire into the gallbladder lumen. The needle was exchanged for an [place] set. A small amount of contrast was injected to  confirm appropriate intraluminal positioning. Over a Benson wire, a 24.2-French Cook cholecystomy tube was advanced into the gallbladder fossa, coiled and locked. Bile was aspirated and a small amount of contrast was injected as several post procedural spot radiographic images were obtained in various obliquities. The catheter was secured to the skin with suture, connected to a drainage bag and a dressing was placed. The patient tolerated the procedure well without immediate post procedural complication.  IMPRESSION: Successful ultrasound and fluoroscopic guided placement of a 10.2 French cholecystostomy tube.   Electronically Signed   By: Sandi Mariscal M.D.   On: 02/27/2014 11:34    Anti-infectives: Anti-infectives    Start     Dose/Rate Route Frequency Ordered Stop   02/26/14 2000  vancomycin (VANCOCIN) 1,250 mg in sodium chloride 0.9 % 250 mL IVPB  Status:  Discontinued     1,250 mg166.7 mL/hr over 90 Minutes Intravenous Every 24 hours 02/25/14 1735 02/25/14 1839   02/26/14 1800  vancomycin (VANCOCIN) 1,250 mg in sodium chloride 0.9 % 250 mL IVPB  Status:  Discontinued     1,250 mg166.7 mL/hr over 90 Minutes Intravenous Every 24 hours 02/25/14 1732 02/25/14 1735   02/26/14 0000  piperacillin-tazobactam (ZOSYN) IVPB 3.375 g     3.375 g12.5 mL/hr over 240 Minutes Intravenous Every 8 hours 02/25/14 1732     02/25/14 1815  metroNIDAZOLE (FLAGYL) IVPB 500 mg  Status:  Discontinued     500 mg100 mL/hr over 60 Minutes Intravenous  Once 02/25/14 1811 02/25/14 1836   02/25/14 1700  vancomycin (VANCOCIN) 2,500 mg in sodium chloride 0.9 % 500 mL IVPB  Status:  Discontinued     2,500 mg250 mL/hr over 120 Minutes Intravenous  Once 02/25/14 1647 02/25/14 1959   02/25/14 1645  piperacillin-tazobactam (ZOSYN) IVPB 3.375 g     3.375 g100 mL/hr over 30 Minutes Intravenous  Once 02/25/14 1644 02/25/14 1841   02/25/14 1645  vancomycin (VANCOCIN) IVPB 1000 mg/200 mL premix  Status:  Discontinued     1,000 mg200 mL/hr  over 60 Minutes Intravenous  Once 02/25/14 1644 02/25/14 1647       Assessment/Plan Acute calculous cholecystitis Sigmoid diverticulitis Leukocytosis - improved to 11.0 Ventral incisional hernia H/o appendectomy, nissen fundoplication, L&R IHR, transurethral resection of prostate, sigmoidectomy (colovesical fistula) OSA on CPAP COPD DM2 HTN Mild dehydration Morbid obesity   Plan: 1. Treating diverticulitis and acute cholecystitis with Zosyn 2. Start full liquids  3. Pain control, antiemetics 4. S/p IR perc chole drain and will plan interval cholecystectomy in 6-8 weeks down the road when diverticulitis and acute cholecystitis can resolve. 5. Liver nodular on CT, coags okay. LFT elevation resolving. 6.  Plan for d/c home tomorrow with drain for 6-8 weeks.  Follow up with Dr. Redmond Pulling in 2-3 weeks. 7.  Will need education on management of drain and follow up with IR.    LOS: 3 days    Coralie Keens 02/28/2014, 8:14 AM Pager: [phone]

## 2014-02-28 NOTE — Plan of Care (Signed)
Problem: Phase I Progression Outcomes Goal: Voiding-avoid urinary catheter unless indicated Outcome: Not Met (add Reason) Patient with Chronic foley.

## 2014-03-01 LAB — GLUCOSE, CAPILLARY
Glucose-Capillary: 110 mg/dL — ABNORMAL HIGH (ref 70–99)
Glucose-Capillary: 131 mg/dL — ABNORMAL HIGH (ref 70–99)
Glucose-Capillary: 136 mg/dL — ABNORMAL HIGH (ref 70–99)
Glucose-Capillary: 126 mg/dL — ABNORMAL HIGH (ref 70–99)
Glucose-Capillary: 165 mg/dL — ABNORMAL HIGH (ref 70–99)
Glucose-Capillary: 96 mg/dL (ref 70–99)

## 2014-03-01 MED ORDER — AMOXICILLIN-POT CLAVULANATE 875-125 MG PO TABS: 1.0000 | ORAL_TABLET | Freq: Two times a day (BID) | ORAL | Status: DC

## 2014-03-01 MED ORDER — ALPRAZOLAM 0.5 MG PO TABS
0.5000 mg | ORAL_TABLET | Freq: Once | ORAL | Status: AC
  Administered 2014-03-01: 0.5 mg via ORAL
  Filled 2014-03-01: qty 1

## 2014-03-01 NOTE — Progress Notes (Signed)
Utilization review complete. Tami Blass RN CCM Case Mgmt phone [phone] 

## 2014-03-01 NOTE — Progress Notes (Addendum)
Zosyn stopped at 0120 due to loss of IV access. Restarted at 0308am. Will notify pharmacy to reschedule.

## 2014-03-01 NOTE — Plan of Care (Signed)
Problem: Food- and Nutrition-Related Knowledge Deficit (NB-1.1) Goal: Nutrition education Formal process to instruct or train a patient/client in a skill or to impart knowledge to help patients/clients voluntarily manage or modify food choices and eating behavior to maintain or improve health. Nutrition Education Note  RD consulted for nutrition education regarding a low fiber diet.   RD provided "Low Fiber Nutrition Therapy" handout from the [employer] Nutrition Care manual. Discussed rationale for diet. Discussed avoiding high fiber foods for 1-2 weeks, while slowly adding them back into diet after this time period. Provided examples of ways to choose low fiber alternatives to foods and incorporate these foods into his meal plan. Teach back method used.  Pt requested additional handouts, which this RD provided. Also reviewed diet instructions with pt's daughter.   Expect very good compliance.  Body mass index is 33.3 kg/(m^2). Pt meets criteria for obesity, class I based on current BMI.  Current diet order is full liquid, patient is consuming approximately 100% of meals at this time. Labs and medications reviewed. No further nutrition interventions warranted at this time. RD contact information provided. If additional nutrition issues arise, please re-consult RD.  Skylyn Slezak A. Jimmye Norman, RD, LDN, CDE Pager: [phone] After hours Pager: [phone]

## 2014-03-01 NOTE — Progress Notes (Signed)
Referring Physician(s): [facility];TR[facility]  Subjective:  Percutaneous cholecystostomy drain placed 2/7 Draining well Up in chair; pleasant Doing well   Allergies: Review of patient's allergies indicates no known allergies.  Medications: Prior to Admission medications   Medication Sig Start Date End Date Taking? Authorizing Provider  albuterol (PROAIR HFA) 108 (90 BASE) MCG/ACT inhaler Inhale 2 puffs into the lungs every 6 (six) hours as needed for wheezing or shortness of breath. 02/25/11 02/25/14 Yes Tammy S Parrett, NP  aspirin 81 MG tablet Take 1 tablet (81 mg total) by mouth every morning. Stop for 1 week and then resume 01/08/12  Yes Barton Dubois, MD  atorvastatin (LIPITOR) 40 MG tablet Take 40 mg by mouth every morning.    Yes Historical Provider, MD  celecoxib (CELEBREX) 200 MG capsule Take 1 capsule (200 mg total) by mouth every evening. Hold for the next 5 days and then resume 01/08/12  Yes Barton Dubois, MD  cholecalciferol (VITAMIN D) 1000 UNITS tablet Take 2,000 Units by mouth 2 (two) times daily.    Yes Historical Provider, MD  enalapril (VASOTEC) 2.5 MG tablet Take 2.5 mg by mouth daily. 02/15/14  Yes Historical Provider, MD  Indacaterol Maleate (ARCAPTA NEOHALER) 75 MCG CAPS Place 1 capsule into inhaler and inhale every morning. 01/01/12  Yes Kathee Delton, MD  metFORMIN (GLUCOPHAGE) 500 MG tablet Take 500 mg by mouth 3 (three) times daily. 02/15/14  Yes Historical Provider, MD  misoprostol (CYTOTEC) 100 MCG tablet Take 100-200 mcg by mouth 4 (four) times daily. Takes 1 tab in am, 1 tab in pm, 2 tabs at bedtime   Yes Historical Provider, MD  Multiple Vitamins-Minerals (CENTRUM) tablet Take 1 tablet by mouth daily.     Yes Historical Provider, MD  pantoprazole (PROTONIX) 40 MG tablet Take 1 tablet (40 mg total) by mouth daily. 01/08/12  Yes Barton Dubois, MD  PARoxetine (PAXIL) 20 MG tablet Take 20 mg by mouth every morning.     Yes Historical Provider, MD  vitamin C (ASCORBIC ACID)  500 MG tablet Take 500 mg by mouth daily.   Yes Historical Provider, MD  traMADol (ULTRAM) 50 MG tablet Take 1 tablet (50 mg total) by mouth every 8 (eight) hours as needed for pain. 01/08/12   Barton Dubois, MD    Review of Systems  Vital Signs: BP 113/64 mmHg  Pulse 88  Temp(Src) 97.9 F (36.6 C) (Oral)  Resp 18  Ht 6' (1.829 m)  Wt 111.4 kg (245 lb 9.5 oz)  BMI 33.30 kg/m2  SpO2 92%  Physical Exam  Abdominal:  Site of chole drain is clean and dry NT; no bleeding Output bile 200 cc yesterday Afeb; vss     Imaging: Dg Chest 2 View  02/25/2014   CLINICAL DATA:  Abdominal pain and shortness of breath. Constipation. Right-sided epigastric pain. Slight cough.  EXAM: CHEST - 2 VIEW  COMPARISON:  None.  FINDINGS: The heart size is normal. Emphysematous changes are present. No focal airspace disease is evident. Scarring at the lung bases is stable. Surgical clips are present at the GE junction.  IMPRESSION: 1. No acute cardiopulmonary disease. 2. Emphysema.   Electronically Signed   By: Lawrence Santiago M.D.   On: 02/25/2014 14:32   Ct Abdomen Pelvis W Contrast  02/25/2014   CLINICAL DATA:  Right lower quadrant pain x3 days, constipation, abdominal swelling. Prior appendectomy.  EXAM: CT ABDOMEN AND PELVIS WITH CONTRAST  TECHNIQUE: Multidetector CT imaging of the abdomen and pelvis was performed  using the standard protocol following bolus administration of intravenous contrast.  CONTRAST:  165mL OMNIPAQUE IOHEXOL 300 MG/ML  SOLN  COMPARISON:  None.  FINDINGS: Lower chest:  Emphysematous changes at the lung bases.  Hepatobiliary: Liver is notable for a nodular hepatic contour, suggesting cirrhosis. No focal hepatic lesion is seen.  2.5 cm gallstone in the gallbladder neck. Associated gallbladder wall thickening with pericholecystic fluid/inflammatory changes.  Pancreas: Within normal limits.  Spleen: Calcified splenic granulomata.  Adrenals/Urinary Tract: Adrenal glands are within normal limits.   Kidneys are within normal limits.  No hydronephrosis.  Distended bladder with trabeculations.  Stomach/Bowel: Surgical clips at the GE junction.  Stomach is unremarkable.  Stable dilated loop of small bowel leading to a ventral hernia in the anterior mid abdomen (series 3/image 54), with a decompressed loop exiting the hernia, indicating some degree of partial obstruction. However, this appearance is chronic.  Prior appendectomy.  Sigmoid diverticulosis, with mild inflammatory changes involving the proximal sigmoid colon (series 3/image 64), compatible with mild sigmoid diverticulitis. No drainable fluid collection/abscess. No free air.  Vascular/Lymphatic: Atherosclerotic calcifications of the abdominal aorta and branch vessels.  No suspicious abdominopelvic lymphadenopathy.  Reproductive: Prostate is grossly unremarkable.  Other: Postsurgical changes related to prior right inguinal hernia repair. Recurrent fat containing right inguinal hernia.  Small fat containing left inguinal hernia.  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  Musculoskeletal: Degenerative changes of the visualized thoracolumbar spine, most prominent at L4-5.  IMPRESSION: Cholelithiasis with suspected acute cholecystitis.  Suspected mild sigmoid diverticulitis. No drainable fluid collection/abscess. No free air.  Ventral hernia in the anterior mid abdominal wall with some degree of secondary partial small bowel bowel obstruction, chronic.  Numerous additional stable ancillary findings as above.   Electronically Signed   By: Julian Hy M.D.   On: 02/25/2014 17:27   Ir Perc Cholecystostomy  02/27/2014   INDICATION: Acute cholecystitis. Poor operative candidate given cardiac an respiratory compromise. Additionally, patient has concomitant episode of acute diverticulitis. Please perform ultrasound and fluoroscopic  guided cholecystostomy tube placement.  EXAM: ULTRASOUND AND FLUOROSCOPIC-GUIDED CHOLECYSTOSTOMY TUBE PLACEMENT  COMPARISON:  Abdominal ultrasound - 02/25/2014  MEDICATIONS: Fentanyl 75 mcg IV; Versed 1.5 mg IV; The patient is currently admitted to the hospital and on intravenous antibiotics. Antibiotics were administered within an appropriate time frame prior to skin puncture.  ANESTHESIA/SEDATION: Total Moderate Sedation Time  20 minutes  CONTRAST:  37mL OMNIPAQUE IOHEXOL 300 MG/ML SOLN - administered into the gallbladder lumen  FLUOROSCOPY TIME:  42 seconds (30 mGy)  COMPLICATIONS: None immediate.  PROCEDURE: Informed written consent was obtained from the patient after a discussion of the risks, benefits and alternatives to treatment. Questions regarding the procedure were encouraged and answered. A timeout was performed prior to the initiation of the procedure.  The right upper abdominal quadrant was prepped and draped in the usual sterile fashion, and a sterile drape was applied covering the operative field. Maximum barrier sterile technique with sterile gowns and gloves were used for the procedure. A timeout was performed prior to the initiation of the procedure. Local anesthesia was provided with 1% lidocaine with epinephrine.  Ultrasound scanning of the right upper quadrant demonstrates a markedly dilated gallbladder. Of note, the patient reported pain with ultrasound imaging over the gallbladder. Utilizing a transhepatic approach, a 22 gauge needle was advanced into the gallbladder under direct ultrasound guidance. An ultrasound image was  saved for documentation purposes. Appropriate intraluminal puncture was confirmed with the efflux of bile and advancement of an 0.018 wire into the gallbladder lumen. The needle was exchanged for an [place] set. A small amount of contrast was injected to confirm appropriate intraluminal positioning. Over a Benson wire, a 32.2-French Cook cholecystomy tube was advanced  into the gallbladder fossa, coiled and locked. Bile was aspirated and a small amount of contrast was injected as several post procedural spot radiographic images were obtained in various obliquities. The catheter was secured to the skin with suture, connected to a drainage bag and a dressing was placed. The patient tolerated the procedure well without immediate post procedural complication.  IMPRESSION: Successful ultrasound and fluoroscopic guided placement of a 10.2 French cholecystostomy tube.   Electronically Signed   By: Sandi Mariscal M.D.   On: 02/27/2014 11:34    Labs:  CBC:  Recent Labs  02/25/14 1334 02/26/14 0445 02/27/14 0655 02/28/14 0745  WBC 17.0* 13.0* 16.8* 11.0*  HGB 17.2* 15.4 14.8 14.9  HCT 50.9 46.5 44.1 44.4  PLT 188 173 162 180    COAGS:  Recent Labs  02/26/14 0445  INR 1.23  APTT 35    BMP:  Recent Labs  02/25/14 1334 02/26/14 0445 02/27/14 0655 02/28/14 0745  NA 135 135 137 137  K 4.6 4.0 4.0 3.7  CL 100 102 105 105  CO2 28 26 25 27   GLUCOSE 156* 140* 132* 129*  BUN 13 11 13 12   CALCIUM 10.3 8.7 8.7 8.5  CREATININE 1.17 1.10 0.98 1.04  GFRNONAA 56* 61* 74* 65*  GFRAA 65* 70* 86* 75*    LIVER FUNCTION TESTS:  Recent Labs  02/25/14 1334 02/26/14 0445 02/27/14 0655 02/28/14 0745  BILITOT 0.9 1.6* 1.4* 0.9  AST 29 265* 67* 42*  ALT 35 256* 123* 84*  ALKPHOS 74 93 87 84  PROT 7.6 6.6 6.1 6.0  ALBUMIN 3.6 3.0* 2.7* 2.4*    Assessment and Plan:  Chole drain intact Must stay in place 6-8 weeks unless different plan per [facility] Home care: flush 5-10 cc sterile saline once daily To follow with [facility]    I spent a total of 15 minutes face to face in clinical consultation/evaluation, greater than 50% of which was counseling/coordinating care for perc chole drain  Signed: Evonna Stoltz A 03/01/2014, 11:26 AM

## 2014-03-01 NOTE — Discharge Summary (Signed)
Physician Discharge Summary  Lance Powell WUX:[id number] DOB: Jun 28, 1929 DOA: 02/25/2014  PCP: Lauraine Rinne, MD  Admit date: 02/25/2014 Discharge date: 03/01/2014  Time spent: 40 minutes  Recommendations for Outpatient Follow-up:  1. Follow-up with primary care physician within one week. 2. Follow-up with general surgery. 3. Follow-up with pulmonology Dr. [name] in one week. 4. Home health PT/RN ordered  Discharge Diagnoses:  Active Problems:   DM type 2, not at goal   Obstructive sleep apnea   COPD (chronic obstructive pulmonary disease)   Diverticulitis   Urinary retention   Hypertension   SBO (small bowel obstruction)   Sepsis   Cholecystitis   Calculus of gallbladder with acute cholecystitis   Acute cholecystitis   Abdominal pain   Diverticulitis of large intestine without perforation or abscess without bleeding   Blood poisoning   Discharge Condition: Stable  Diet recommendation: Heart healthy  Filed Weights   02/25/14 1328 02/25/14 2045  Weight: 112.492 kg (248 lb) 111.4 kg (245 lb 9.5 oz)    History of present illness:  Lance Powell is a 81 y.o. male   has a past medical history of Osteoarthritis; Rheumatoid arthritis(714.0); Hyperlipemia; Diabetes mellitus; COPD (chronic obstructive pulmonary disease); and Sleep apnea.   Presented with  3 day hx of abdominal pain in Lower left quadrant, fever chills. Denies any nausea or vomiting. He had no appetite. No BM for the past 3 days able to pass gas. He presented to his CP and was found to be febrile and tachycardiac. Yesterday he started to have right upper quadrant pain as well. He was sent to [facility] ER. CT scan showed Cholelithiasis with suspected acute cholecystitis. Suspected mild sigmoid diverticulitis. No drainable fluid collection/abscess. No free air. Ventral hernia in the anterior mid abdominal wall with some degree of secondary partial small bowel bowel obstruction, chronic. Patient initially  presented with fever, tachycardia, WBC elevated to 17. He met criteria for sepsis. Patient was hydrated and started on broad spectrum antibiotics.   Patient has chronic periumbelical hernia. CT scan showed chronic partial obstruction.  He has urinary retention and has to use I/O catheter every night to drain completely.  Patient reports hx of COPD but have not had significant limitations.   Hospitalist was called for admission for Diverticulitis, Cholecystitis and chronic partial SBO due to hernia   Hospital Course:   Acute cholecystitis With evidence of sepsis, patient is started on IV Zosyn. General surgery consulted, s/p percutaneous cholecystostomy. Continue antibiotics for now, per general surgery cholecystectomy in 6-8 weeks. Started on Zosyn while he is in the hospital, discharged on Augmentin for 7 more days. Follow-up with Dr. Redmond Pulling of [facility] surgery as outpatient.  Sepsis Met sepsis criteria with respiratory rate of 27, heart rate of 112, WBCs of 17K and presence of infection on admission. IV fluids resuscitation was given. Started on IV Zosyn as sepsis causes probably acute cholecystitis/diverticulitis. Sepsis physiology resolved, patient improved.  Acute diverticulitis CT scan also showed mild diverticulitis and patient was symptomatic. Patient is on IV Zosyn that should cover it, also bowel rest. Patient was on clear liquids, tolerated full liquids in the morning and had 1 meal of soft diet prior to discharge. Patient will be discharged on Augmentin, I told him Augmentin can cause diarrhea.  Deconditioning Patient seen by physical therapy was been in bed for the past 3-4 days. Recommended home health PT. but he was desaturating with ambulation to 88%. Patient discharge and oxygen, he might not need the  oxygen if his a functional status improved  SBO Patient does have hernia, with chronic loops inside it, likely this is not acute.  COPD Continue  nebulizers, denies any complaints. Patient reported that he has DOE before he came in he can barely go to the mailbox and get short of breath. Ambulated and appears to be needing oxygen with ambulation because of desaturation.  Hypertension Enalapril held, restarted the time of discharge.   Procedures:  Percutaneous cholecystostomy tube placed by Dr. Pascal Lux on 02/27/2014  Consultations:  Gen. surgery.  Interventional radiology  Discharge Exam: Filed Vitals:   03/01/14 1347  BP: 109/62  Pulse: 105  Temp: 98.2 F (36.8 C)  Resp: 18   General: Alert and awake, oriented x3, not in any acute distress. HEENT: anicteric sclera, pupils reactive to light and accommodation, EOMI CVS: S1-S2 clear, no murmur rubs or gallops Chest: clear to auscultation bilaterally, no wheezing, rales or rhonchi Abdomen: soft nontender, nondistended, normal bowel sounds, no organomegaly Extremities: no cyanosis, clubbing or edema noted bilaterally Neuro: Cranial nerves II-XII intact, no focal neurological deficits  Discharge Instructions   Discharge Instructions    Diet - low sodium heart healthy    Complete by:  As directed      Increase activity slowly    Complete by:  As directed           Current Discharge Medication List    START taking these medications   Details  amoxicillin-clavulanate (AUGMENTIN) 875-125 MG per tablet Take 1 tablet by mouth 2 (two) times daily. Qty: 14 tablet, Refills: 0      CONTINUE these medications which have NOT CHANGED   Details  aspirin 81 MG tablet Take 1 tablet (81 mg total) by mouth every morning. Stop for 1 week and then resume    atorvastatin (LIPITOR) 40 MG tablet Take 40 mg by mouth every morning.     celecoxib (CELEBREX) 200 MG capsule Take 1 capsule (200 mg total) by mouth every evening. Hold for the next 5 days and then resume    cholecalciferol (VITAMIN D) 1000 UNITS tablet Take 2,000 Units by mouth 2 (two) times daily.     enalapril  (VASOTEC) 2.5 MG tablet Take 2.5 mg by mouth daily.    Indacaterol Maleate (ARCAPTA NEOHALER) 75 MCG CAPS Place 1 capsule into inhaler and inhale every morning. Qty: 90 capsule, Refills: 1    metFORMIN (GLUCOPHAGE) 500 MG tablet Take 500 mg by mouth 3 (three) times daily.    misoprostol (CYTOTEC) 100 MCG tablet Take 100-200 mcg by mouth 4 (four) times daily. Takes 1 tab in am, 1 tab in pm, 2 tabs at bedtime    Multiple Vitamins-Minerals (CENTRUM) tablet Take 1 tablet by mouth daily.      pantoprazole (PROTONIX) 40 MG tablet Take 1 tablet (40 mg total) by mouth daily. Qty: 30 tablet, Refills: 1    PARoxetine (PAXIL) 20 MG tablet Take 20 mg by mouth every morning.      vitamin C (ASCORBIC ACID) 500 MG tablet Take 500 mg by mouth daily.      STOP taking these medications     albuterol (PROAIR HFA) 108 (90 BASE) MCG/ACT inhaler      traMADol (ULTRAM) 50 MG tablet        No Known Allergies Follow-up Information    Follow up with [name], MD. Schedule an appointment as soon as possible for a visit on 03/30/2014.   Specialty:  General Surgery   Why:  arrive at 8:30AM for a 9AM appointment with your surgeon to recheck your gallbladder   Contact information:   [address] [place] [address] [phone]       Follow up with [facility].   Why:  Home Health Physical Therapy, Occupational Therapy, RN and aide   Contact information:   [address] [place] [address] [phone]       Follow up with [facility].   Contact information:   [address] [place] [address] [phone]       The results of significant diagnostics from this hospitalization (including imaging, microbiology, ancillary and laboratory) are listed below for reference.    Significant Diagnostic Studies: Dg Chest 2 View  02/25/2014   CLINICAL DATA:  Abdominal pain and shortness of  breath. Constipation. Right-sided epigastric pain. Slight cough.  EXAM: CHEST - 2 VIEW  COMPARISON:  None.  FINDINGS: The heart size is normal. Emphysematous changes are present. No focal airspace disease is evident. Scarring at the lung bases is stable. Surgical clips are present at the GE junction.  IMPRESSION: 1. No acute cardiopulmonary disease. 2. Emphysema.   Electronically Signed   By: Lawrence Santiago M.D.   On: 02/25/2014 14:32   Ct Abdomen Pelvis W Contrast  02/25/2014   CLINICAL DATA:  Right lower quadrant pain x3 days, constipation, abdominal swelling. Prior appendectomy.  EXAM: CT ABDOMEN AND PELVIS WITH CONTRAST  TECHNIQUE: Multidetector CT imaging of the abdomen and pelvis was performed using the standard protocol following bolus administration of intravenous contrast.  CONTRAST:  139m OMNIPAQUE IOHEXOL 300 MG/ML  SOLN  COMPARISON:  None.  FINDINGS: Lower chest:  Emphysematous changes at the lung bases.  Hepatobiliary: Liver is notable for a nodular hepatic contour, suggesting cirrhosis. No focal hepatic lesion is seen.  2.5 cm gallstone in the gallbladder neck. Associated gallbladder wall thickening with pericholecystic fluid/inflammatory changes.  Pancreas: Within normal limits.  Spleen: Calcified splenic granulomata.  Adrenals/Urinary Tract: Adrenal glands are within normal limits.  Kidneys are within normal limits.  No hydronephrosis.  Distended bladder with trabeculations.  Stomach/Bowel: Surgical clips at the GE junction.  Stomach is unremarkable.  Stable dilated loop of small bowel leading to a ventral hernia in the anterior mid abdomen (series 3/image 54), with a decompressed loop exiting the hernia, indicating some degree of partial obstruction. However, this appearance is chronic.  Prior appendectomy.  Sigmoid diverticulosis, with mild inflammatory changes involving the proximal sigmoid colon (series 3/image 64), compatible with mild sigmoid diverticulitis. No drainable fluid  collection/abscess. No free air.  Vascular/Lymphatic: Atherosclerotic calcifications of the abdominal aorta and branch vessels.  No suspicious abdominopelvic lymphadenopathy.  Reproductive: Prostate is grossly unremarkable.  Other: Postsurgical changes related to prior right inguinal hernia repair. Recurrent fat containing right inguinal hernia.  Small fat containing left inguinal hernia.  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  Musculoskeletal: Degenerative changes of the visualized thoracolumbar spine, most prominent at L4-5.  IMPRESSION: Cholelithiasis with suspected acute cholecystitis.  Suspected mild sigmoid diverticulitis. No drainable fluid collection/abscess. No free air.  Ventral hernia in the anterior mid abdominal wall with some degree of secondary partial small bowel bowel obstruction, chronic.  Numerous additional stable ancillary findings as above.   Electronically Signed   By: SHenderson NewcomerD.  On: 02/25/2014 17:27   Ir Perc Cholecystostomy  02/27/2014   INDICATION: Acute cholecystitis. Poor operative candidate given cardiac an respiratory compromise. Additionally, patient has concomitant episode of acute diverticulitis. Please perform ultrasound and fluoroscopic guided cholecystostomy tube placement.  EXAM: ULTRASOUND AND FLUOROSCOPIC-GUIDED CHOLECYSTOSTOMY TUBE PLACEMENT  COMPARISON:  Abdominal ultrasound - 02/25/2014  MEDICATIONS: Fentanyl 75 mcg IV; Versed 1.5 mg IV; The patient is currently admitted to the hospital and on intravenous antibiotics. Antibiotics were administered within an appropriate time frame prior to skin puncture.  ANESTHESIA/SEDATION: Total Moderate Sedation Time  20 minutes  CONTRAST:  73m OMNIPAQUE IOHEXOL 300 MG/ML SOLN - administered into the gallbladder lumen  FLUOROSCOPY TIME:  42 seconds (30 mGy)  COMPLICATIONS: None immediate.   PROCEDURE: Informed written consent was obtained from the patient after a discussion of the risks, benefits and alternatives to treatment. Questions regarding the procedure were encouraged and answered. A timeout was performed prior to the initiation of the procedure.  The right upper abdominal quadrant was prepped and draped in the usual sterile fashion, and a sterile drape was applied covering the operative field. Maximum barrier sterile technique with sterile gowns and gloves were used for the procedure. A timeout was performed prior to the initiation of the procedure. Local anesthesia was provided with 1% lidocaine with epinephrine.  Ultrasound scanning of the right upper quadrant demonstrates a markedly dilated gallbladder. Of note, the patient reported pain with ultrasound imaging over the gallbladder. Utilizing a transhepatic approach, a 22 gauge needle was advanced into the gallbladder under direct ultrasound guidance. An ultrasound image was saved for documentation purposes. Appropriate intraluminal puncture was confirmed with the efflux of bile and advancement of an 0.018 wire into the gallbladder lumen. The needle was exchanged for an ASalemset. A small amount of contrast was injected to confirm appropriate intraluminal positioning. Over a Benson wire, a 1512-French Cook cholecystomy tube was advanced into the gallbladder fossa, coiled and locked. Bile was aspirated and a small amount of contrast was injected as several post procedural spot radiographic images were obtained in various obliquities. The catheter was secured to the skin with suture, connected to a drainage bag and a dressing was placed. The patient tolerated the procedure well without immediate post procedural complication.  IMPRESSION: Successful ultrasound and fluoroscopic guided placement of a 10.2 French cholecystostomy tube.   Electronically Signed   By: JSandi MariscalM.D.   On: 02/27/2014 11:34    Microbiology: Recent Results  (from the past 240 hour(s))  Culture, blood (routine x 2)     Status: None (Preliminary result)   Collection Time: 02/25/14  4:16 PM  Result Value Ref Range Status   Specimen Description BLOOD RIGHT ARM  Final   Special Requests BOTTLES DRAWN AEROBIC AND ANAEROBIC 5CC  Final   Culture   Final           BLOOD CULTURE RECEIVED NO GROWTH TO DATE CULTURE WILL BE HELD FOR 5 DAYS BEFORE ISSUING A FINAL NEGATIVE REPORT [facility]formed at [facility]   Report Status PENDING  Incomplete  Urine culture     Status: None   Collection Time: 02/25/14  4:32 PM  Result Value Ref Range Status   Specimen Description URINE, RANDOM  Final   Special Requests NONE  Final   Colony Count   Final    2,000 COLONIES/ML Performed at [facility]   Culture   Final    INSIGNIFICANT GROWTH Performed at [facility]  Report Status 02/27/2014 FINAL  Final  Culture, blood (routine x 2)     Status: None (Preliminary result)   Collection Time: 02/25/14  4:35 PM  Result Value Ref Range Status   Specimen Description BLOOD RIGHT HAND  Final   Special Requests BOTTLES DRAWN AEROBIC AND ANAEROBIC 5CC  Final   Culture   Final           BLOOD CULTURE RECEIVED NO GROWTH TO DATE CULTURE WILL BE HELD FOR 5 DAYS BEFORE ISSUING A FINAL NEGATIVE REPORT Performed at [facility]    Report Status PENDING  Incomplete     Labs: Basic Metabolic Panel:  Recent Labs Lab 02/25/14 1334 02/26/14 0445 02/27/14 0655 02/28/14 0745  NA 135 135 137 137  K 4.6 4.0 4.0 3.7  CL 100 102 105 105  CO2 _0 GLUCOSE 156* 140* 132* 129*  BUN _1 CREATININE 1.17 1.10 0.98 1.04  CALCIUM 10.3 8.7 8.7 8.5  MG  --  1.4* 2.1  --   PHOS  --  2.4  --   --    Liver Function Tests:  Recent Labs Lab 02/25/14 1334 02/26/14 0445 02/27/14 0655 02/28/14 0745  AST 29 265* 67* 42*  ALT 35 256* 123* 84*  ALKPHOS 74 93 87 84  BILITOT 0.9 1.6* 1.4* 0.9  PROT 7.6 6.6 6.1 6.0  ALBUMIN 3.6 3.0*  2.7* 2.4*    Recent Labs Lab 02/26/14 0445  LIPASE 66*   No results for input(s): AMMONIA in the last 168 hours. CBC:  Recent Labs Lab 02/25/14 1334 02/26/14 0445 02/27/14 0655 02/28/14 0745  WBC 17.0* 13.0* 16.8* 11.0*  NEUTROABS 12.3*  --   --   --   HGB 17.2* 15.4 14.8 14.9  HCT 50.9 46.5 44.1 44.4  MCV 92.5 93.2 92.6 94.1  PLT 188 173 162 180   Cardiac Enzymes: No results for input(s): CKTOTAL, CKMB, CKMBINDEX, TROPONINI in the last 168 hours. BNP: BNP (last 3 results) No results for input(s): BNP in the last 8760 hours.  ProBNP (last 3 results) No results for input(s): PROBNP in the last 8760 hours.  CBG:  Recent Labs Lab 02/27/14 1717 02/27/14 2036 02/28/14 0122 02/28/14 0412 03/01/14 1204  GLUCAP 168* 135* 99 121* 165*       Signed:  Talayia Hjort A  [facility] 03/01/2014, 3:11 PM

## 2014-03-01 NOTE — Progress Notes (Addendum)
Physical Therapy Treatment Patient Details Name: Lance Powell MRN: [id number] DOB: [birth year] Today's Date: 03/01/2014    History of Present Illness 81 y/o male admitted with acute calculous cholecystitis and diverticulitis without perforation. Pt s/p placement of cholecystotomy tube.    PT Comments    Progressing steadily.  Emphasis on gait training with standard cane.  Uses it appropriately when concentrating but easily gets distracted.  On RA  sats maintained at 90% after return from gait in sitting.  EHR 119 bpm  Follow Up Recommendations  Home health PT     Equipment Recommendations  Other (comment)    Recommendations for Other Services       Precautions / Restrictions Precautions Precautions: None Restrictions Weight Bearing Restrictions: No    Mobility  Bed Mobility Overal bed mobility: Modified Independent                Transfers Overall transfer level: Needs assistance Equipment used: Straight cane;None Transfers: Sit to/from Stand Sit to Stand: Supervision         General transfer comment: likely at mod I in his home, but needed safety assist in the crowded hospital room  Ambulation/Gait Ambulation/Gait assistance: Supervision Ambulation Distance (Feet): 230 Feet Assistive device: Straight cane Gait Pattern/deviations: Step-through pattern Gait velocity: slow, but able to speed up minimally if necessary   General Gait Details: mildly unsteady in close quarters, more steady in the halls, but wtil wanders to maintain balance   Stairs            Wheelchair Mobility    Modified Rankin (Stroke Patients Only)       Balance Overall balance assessment: Needs assistance Sitting-balance support: No upper extremity supported Sitting balance-Leahy Scale: Good       Standing balance-Leahy Scale: Fair                      Cognition Arousal/Alertness: Awake/alert Behavior During Therapy: WFL for tasks  assessed/performed Overall Cognitive Status: Within Functional Limits for tasks assessed                      Exercises      General Comments        Pertinent Vitals/Pain Pain Assessment: No/denies pain    Home Living                      Prior Function            PT Goals (current goals can now be found in the care plan section) Acute Rehab PT Goals Patient Stated Goal: Get home to wife PT Goal Formulation: With patient Time For Goal Achievement: 03/07/14 Potential to Achieve Goals: Good Progress towards PT goals: Progressing toward goals    Frequency  Min 3X/week    PT Plan Current plan remains appropriate    Co-evaluation             End of Session   Activity Tolerance: Patient tolerated treatment well Patient left: in chair;with call bell/phone within reach     Time: 0957-1024 PT Time Calculation (min) (ACUTE ONLY): 27 min  Charges:  $Gait Training: 8-22 mins $Therapeutic Activity: 8-22 mins                    G Codes:  Functional Limitation: Mobility: Walking and moving around   [facility] 03/01/2014, 10:35 AM  03/01/2014  Donnella Sham, PT [phone]  (pager)

## 2014-03-01 NOTE — Evaluation (Signed)
Occupational Therapy Evaluation Patient Details Name: ANNE SEBRING MRN: [id number] DOB: [birth year] Today's Date: 03/01/2014    History of Present Illness 81 y/o male admitted with acute calculous cholecystitis and diverticulitis without perforation. Pt s/p placement of cholecystotomy tube.   Clinical Impression   Pt demonstrates decline in function and safety with ADLs and ADL mobility and would benefit from acute OT services to address impairments to restore PLOF    Follow Up Recommendations  No OT follow up    Equipment Recommendations  None recommended by OT    Recommendations for Other Services       Precautions / Restrictions Precautions Precautions: None Restrictions Weight Bearing Restrictions: No      Mobility Bed Mobility Overal bed mobility: Modified Independent             General bed mobility comments: sitting in recliner upon arrival  Transfers Overall transfer level: Needs assistance Equipment used: Straight cane Transfers: Sit to/from Stand Sit to Stand: Supervision         General transfer comment: likely at mod I in his home, but needed safety assist in the crowded hospital room    Balance Overall balance assessment: Needs assistance Sitting-balance support: No upper extremity supported;Feet supported Sitting balance-Leahy Scale: Good     Standing balance support: Single extremity supported;No upper extremity supported;During functional activity Standing balance-Leahy Scale: Fair                              ADL Overall ADL's : Needs assistance/impaired     Grooming: Wash/dry hands;Wash/dry face;Standing;Supervision/safety   Upper Body Bathing: Supervision/ safety;Set up;Standing   Lower Body Bathing: Set up;Min guard;Sit to/from stand   Upper Body Dressing : Supervision/safety;Set up;Sitting   Lower Body Dressing: Min guard;Set up;Sit to/from stand   Toilet Transfer: Supervision/safety;Regular Toilet;Grab  bars;Ambulation Toilet Transfer Details (indicate cue type and reason): using cane Toileting- Clothing Manipulation and Hygiene: Supervision/safety;Sit to/from stand;Min guard   Tub/ Banker: Min guard;3 in 1;Grab bars   Functional mobility during ADLs: Supervision/safety       Vision  wears reading glasses                   Perception Perception Perception Tested?: No   Praxis Praxis Praxis tested?: Not tested    Pertinent Vitals/Pain Pain Assessment: No/denies pain     Hand Dominance Right   Extremity/Trunk Assessment Upper Extremity Assessment Upper Extremity Assessment: Overall WFL for tasks assessed;Generalized weakness   Lower Extremity Assessment Lower Extremity Assessment: Defer to PT evaluation   Cervical / Trunk Assessment Cervical / Trunk Assessment: Normal   Communication Communication Communication: No difficulties   Cognition Arousal/Alertness: Awake/alert Behavior During Therapy: WFL for tasks assessed/performed Overall Cognitive Status: Within Functional Limits for tasks assessed                     General Comments   pt very pleasant and cooperative, humorous                 Home Living Family/patient expects to be discharged to:: Private residence Living Arrangements: Spouse/significant other Available Help at Discharge: Family Type of Home: House Home Access: Stairs to enter Technical brewer of Steps: 1   Home Layout: One level     Bathroom Shower/Tub: Tub/shower unit;Walk-in shower (has grab bars)   Bathroom Toilet: Handicapped height     Home Equipment: Bedside commode;Shower seat  Prior Functioning/Environment Level of Independence: Independent        Comments: 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 His daughter has been staying with them the last few months to assist.    OT Diagnosis: Generalized weakness   OT Problem List: Decreased strength;Decreased knowledge of use  of DME or AE;Decreased activity tolerance   OT Treatment/Interventions: Self-care/ADL training;Therapeutic exercise;Therapeutic activities;DME and/or AE instruction    OT Goals(Current goals can be found in the care plan section) Acute Rehab OT Goals Patient Stated Goal: Get home to wife OT Goal Formulation: With patient Time For Goal Achievement: 03/08/14 Potential to Achieve Goals: Good ADL Goals Pt Will Perform Upper Body Bathing: with modified independence Pt Will Perform Lower Body Bathing: with modified independence;with set-up;with supervision Pt Will Perform Upper Body Dressing: with modified independence Pt Will Perform Lower Body Dressing: with modified independence;with set-up;with supervision Pt Will Transfer to Toilet: with modified independence;grab bars;regular height toilet Pt Will Perform Toileting - Clothing Manipulation and hygiene: sit to/from stand;with modified independence;with supervision Pt Will Perform Tub/Shower Transfer: grab bars;with modified independence;with supervision;shower seat  OT Frequency: Min 2X/week   Barriers to D/C:    none                     End of Session Equipment Utilized During Treatment: Other (comment) (cane)  Activity Tolerance: Patient tolerated treatment well Patient left: in chair   Time: 1022-1049 OT Time Calculation (min): 27 min Charges:  OT General Charges $OT Visit: 1 Procedure OT Evaluation $Initial OT Evaluation Tier I: 1 Procedure OT Treatments $Therapeutic Activity: 8-22 mins G-Codes:    Britt Bottom 03/01/2014, 12:31 PM

## 2014-03-01 NOTE — Progress Notes (Signed)
Patient ID: Lance Powell, male   DOB: November 24, 1929, 81 y.o.   MRN: [id number]     [facility]      [address] [place], [place] [address]    Phone: [phone] FAX: [phone]     Subjective: Denies pain.  No n/v.  Tolerating FLs.    Objective:  Vital signs:  Filed Vitals:   02/28/14 2051 03/01/14 0004 03/01/14 0419 03/01/14 0814  BP: 129/68 126/79 113/64   Pulse: 101 105 88   Temp: 98.4 F (36.9 C) 99 F (37.2 C) 97.9 F (36.6 C)   TempSrc: Oral Oral Oral   Resp: '20 18 18   ' Height:      Weight:      SpO2: 93% 93% 91% 92%    Last BM Date: 02/28/14  Intake/Output   Yesterday:  02/08 0701 - 02/09 0700 In: 942 [P.O.:942] Out: 3500 [Urine:3300; Drains:200] This shift:  Total I/O In: 240 [P.O.:240] Out: 1201 [Urine:1200; Stool:1]   Physical Exam: General: Pt awake/alert/oriented x4 in no  acute distress Abdomen: Soft.  Nondistended.  Non tender. No evidence of peritonitis.  No incarcerated hernias.   Problem List:   Active Problems:   DM type 2, not at goal   Obstructive sleep apnea   COPD (chronic obstructive pulmonary disease)   Diverticulitis   Urinary retention   Hypertension   SBO (small bowel obstruction)   Sepsis   Cholecystitis   Calculus of gallbladder with acute cholecystitis   Acute cholecystitis   Abdominal pain   Diverticulitis of large intestine without perforation or abscess without bleeding   Blood poisoning    Results:   Labs: Results for orders placed or performed during the hospital encounter of 02/25/14 (from the past 48 hour(s))  Glucose, capillary     Status: Abnormal   Collection Time: 02/27/14 12:01 PM  Result Value Ref Range   Glucose-Capillary 131 (H) 70 - 99 mg/dL  Glucose, capillary     Status: Abnormal   Collection Time: 02/27/14  5:17 PM  Result Value Ref Range   Glucose-Capillary 168 (H) 70 - 99 mg/dL  Glucose, capillary     Status: Abnormal   Collection  Time: 02/27/14  8:36 PM  Result Value Ref Range   Glucose-Capillary 135 (H) 70 - 99 mg/dL   Comment 1 Documented in Chart    Comment 2 Notify RN   Glucose, capillary     Status: None   Collection Time: 02/28/14  1:22 AM  Result Value Ref Range   Glucose-Capillary 99 70 - 99 mg/dL  Glucose, capillary     Status: Abnormal   Collection Time: 02/28/14  4:12 AM  Result Value Ref Range   Glucose-Capillary 121 (H) 70 - 99 mg/dL  Comprehensive metabolic panel     Status: Abnormal   Collection Time: 02/28/14  7:45 AM  Result Value Ref Range   Sodium 137 135 - 145 mmol/L   Potassium 3.7 3.5 - 5.1 mmol/L   Chloride 105 96 - 112 mmol/L   CO2 27 19 - 32 mmol/L   Glucose, Bld 129 (H) 70 - 99 mg/dL   BUN 12 6 - 23 mg/dL   Creatinine, Ser 1.04 0.50 - 1.35 mg/dL   Calcium 8.5 8.4 - 10.5 mg/dL   Total Protein 6.0 6.0 - 8.3 g/dL   Albumin 2.4 (L) 3.5 - 5.2 g/dL   AST 42 (H) 0 - 37 U/L   ALT 84 (H) 0 -  53 U/L   Alkaline Phosphatase 84 39 - 117 U/L   Total Bilirubin 0.9 0.3 - 1.2 mg/dL   GFR calc non Af Amer 65 (L) >90 mL/min   GFR calc Af Amer 75 (L) >90 mL/min    Comment: (NOTE) The eGFR has been calculated using the CKD EPI equation. This calculation has not been validated in all clinical situations. eGFR's persistently <90 mL/min signify possible Chronic Kidney Disease.    Anion gap 5 5 - 15  CBC     Status: Abnormal   Collection Time: 02/28/14  7:45 AM  Result Value Ref Range   WBC 11.0 (H) 4.0 - 10.5 K/uL   RBC 4.72 4.22 - 5.81 MIL/uL   Hemoglobin 14.9 13.0 - 17.0 g/dL   HCT 44.4 39.0 - 52.0 %   MCV 94.1 78.0 - 100.0 fL   MCH 31.6 26.0 - 34.0 pg   MCHC 33.6 30.0 - 36.0 g/dL   RDW 14.7 11.5 - 15.5 %   Platelets 180 150 - 400 K/uL    Imaging / Studies: Ir Perc Cholecystostomy  02/27/2014   INDICATION: Acute cholecystitis. Poor operative candidate given cardiac an respiratory compromise. Additionally, patient has concomitant episode of acute diverticulitis. Please perform  ultrasound and fluoroscopic guided cholecystostomy tube placement.  EXAM: ULTRASOUND AND FLUOROSCOPIC-GUIDED CHOLECYSTOSTOMY TUBE PLACEMENT  COMPARISON:  Abdominal ultrasound - 02/25/2014  MEDICATIONS: Fentanyl 75 mcg IV; Versed 1.5 mg IV; The patient is currently admitted to the hospital and on intravenous antibiotics. Antibiotics were administered within an appropriate time frame prior to skin puncture.  ANESTHESIA/SEDATION: Total Moderate Sedation Time  20 minutes  CONTRAST:  77m OMNIPAQUE IOHEXOL 300 MG/ML SOLN - administered into the gallbladder lumen  FLUOROSCOPY TIME:  42 seconds (30 mGy)  COMPLICATIONS: None immediate.  PROCEDURE: Informed written consent was obtained from the patient after a discussion of the risks, benefits and alternatives to treatment. Questions regarding the procedure were encouraged and answered. A timeout was performed prior to the initiation of the procedure.  The right upper abdominal quadrant was prepped and draped in the usual sterile fashion, and a sterile drape was applied covering the operative field. Maximum barrier sterile technique with sterile gowns and gloves were used for the procedure. A timeout was performed prior to the initiation of the procedure. Local anesthesia was provided with 1% lidocaine with epinephrine.  Ultrasound scanning of the right upper quadrant demonstrates a markedly dilated gallbladder. Of note, the patient reported pain with ultrasound imaging over the gallbladder. Utilizing a transhepatic approach, a 22 gauge needle was advanced into the gallbladder under direct ultrasound guidance. An ultrasound image was saved for documentation purposes. Appropriate intraluminal puncture was confirmed with the efflux of bile and advancement of an 0.018 wire into the gallbladder lumen. The needle was exchanged for an AMalagaset. A small amount of contrast was injected to confirm appropriate intraluminal positioning. Over a Benson wire, a 1672-French Cook  cholecystomy tube was advanced into the gallbladder fossa, coiled and locked. Bile was aspirated and a small amount of contrast was injected as several post procedural spot radiographic images were obtained in various obliquities. The catheter was secured to the skin with suture, connected to a drainage bag and a dressing was placed. The patient tolerated the procedure well without immediate post procedural complication.  IMPRESSION: Successful ultrasound and fluoroscopic guided placement of a 10.2 French cholecystostomy tube.   Electronically Signed   By: JSandi MariscalM.D.   On: 02/27/2014 11:34    Medications /  Allergies:  Scheduled Meds: . antiseptic oral rinse  7 mL Mouth Rinse BID  . docusate sodium  100 mg Oral BID  . insulin aspart  0-9 Units Subcutaneous 6 times per day  . mometasone-formoterol  2 puff Inhalation BID  . piperacillin-tazobactam (ZOSYN)  IV  3.375 g Intravenous Q8H  . sodium chloride  3 mL Intravenous Q12H  . tiotropium  18 mcg Inhalation Daily   Continuous Infusions: . lactated ringers 50 mL/hr at 03/01/14 0308   PRN Meds:.acetaminophen **OR** acetaminophen, ipratropium, levalbuterol, morphine injection, ondansetron **OR** ondansetron (ZOFRAN) IV  Antibiotics: Anti-infectives    Start     Dose/Rate Route Frequency Ordered Stop   02/26/14 2000  vancomycin (VANCOCIN) 1,250 mg in sodium chloride 0.9 % 250 mL IVPB  Status:  Discontinued     1,250 mg166.7 mL/hr over 90 Minutes Intravenous Every 24 hours 02/25/14 1735 02/25/14 1839   02/26/14 1800  vancomycin (VANCOCIN) 1,250 mg in sodium chloride 0.9 % 250 mL IVPB  Status:  Discontinued     1,250 mg166.7 mL/hr over 90 Minutes Intravenous Every 24 hours 02/25/14 1732 02/25/14 1735   02/26/14 0000  piperacillin-tazobactam (ZOSYN) IVPB 3.375 g     3.375 g12.5 mL/hr over 240 Minutes Intravenous Every 8 hours 02/25/14 1732     02/25/14 1815  metroNIDAZOLE (FLAGYL) IVPB 500 mg  Status:  Discontinued     500 mg100 mL/hr over 60  Minutes Intravenous  Once 02/25/14 1811 02/25/14 1836   02/25/14 1700  vancomycin (VANCOCIN) 2,500 mg in sodium chloride 0.9 % 500 mL IVPB  Status:  Discontinued     2,500 mg250 mL/hr over 120 Minutes Intravenous  Once 02/25/14 1647 02/25/14 1959   02/25/14 1645  piperacillin-tazobactam (ZOSYN) IVPB 3.375 g     3.375 g100 mL/hr over 30 Minutes Intravenous  Once 02/25/14 1644 02/25/14 1841   02/25/14 1645  vancomycin (VANCOCIN) IVPB 1000 mg/200 mL premix  Status:  Discontinued     1,000 mg200 mL/hr over 60 Minutes Intravenous  Once 02/25/14 1644 02/25/14 1647        Assessment/Plan Acute calculous cholecystitis S/p IR perc chole drain -on zosyn, can change to Augmentin when stable from diverticulitis -teaching on drain care including flushing per IR -will arrange follow up with [place] to discuss interval cholecystectomy in 6-8 weeks--Dr. Redmond Pulling -will need follow up in the drain clinic Acute diverticulitis -appears better on exam, can advance diet from surgery standpoint -asked for dietician consult which I placed   Erby Pian, ANP-BC [facility] Pager [phone](7A-4:30P) For consults and floor pages call [phone](7A-4:30P)  03/01/2014 10:52 AM

## 2014-03-01 NOTE — Progress Notes (Signed)
SATURATION QUALIFICATIONS: (This note is used to comply with regulatory documentation for home oxygen)  Patient Saturations on Room Air at Rest = 94%  Patient Saturations on Room Air while Ambulating = 88  Patient Saturations on 2 Liters of oxygen while Ambulating = 94  Please briefly explain why patient needs home oxygen:

## 2014-03-01 NOTE — Progress Notes (Signed)
03/01/2014 1530 NCM ordered oxygen for pt with [facility] for home. Jonnie Finner RN CCM Case Mgmt phone [phone]

## 2014-03-01 NOTE — Progress Notes (Signed)
CARE MANAGEMENT NOTE 03/01/2014  Patient:  Lance Powell, Lance Powell   Account Number:  [id number]  Date Initiated:  03/01/2014  Documentation initiated by:  [facility]  Subjective/Objective Assessment:   S/p IR perc chole drain     Action/Plan:   lives at home with wife and dtr   Anticipated DC Date:  03/02/2014   Anticipated DC Plan:  [place]  CM consult      [facility] Choice  HOME HEALTH   Choice offered to / List presented to:  C-1 Patient   DME arranged  [facility]      DME agency  [place] arranged  HH-1 RN  [place]      [place].   Status of service:  Completed, signed off Medicare Important Message given?  YES (If response is "NO", the following Medicare IM given date fields will be blank) Date Medicare IM given:  02/28/2014 Medicare IM given by:  Tomi Bamberger Date Additional Medicare IM given:   Additional Medicare IM given by:    Discharge Disposition:  [place]  Per UR Regulation:    If discussed at Long Length of Stay Meetings, dates discussed:    Comments:  03/01/2014 1245 NCM spoke to pt and offered choice for [facility]. Requested [facility] for [facility]. Pt requesting cane for home. Contacted [facility] for [facility] and cane for home. Pt states if insurance will not cover cane, he wants a RW. Jonnie Finner RN CCM Case Mgmt phone [phone]

## 2014-03-02 LAB — GLUCOSE, CAPILLARY
GLUCOSE-CAPILLARY: 153 mg/dL — AB (ref 70–99)
GLUCOSE-CAPILLARY: 117 mg/dL — AB (ref 70–99)
Glucose-Capillary: 128 mg/dL — ABNORMAL HIGH (ref 70–99)

## 2014-03-04 LAB — CULTURE, BLOOD (ROUTINE X 2)
CULTURE: NO GROWTH
Culture: NO GROWTH

## 2014-03-08 ENCOUNTER — Encounter: Payer: Self-pay | Admitting: Medical

## 2014-03-08 ENCOUNTER — Ambulatory Visit (INDEPENDENT_AMBULATORY_CARE_PROVIDER_SITE_OTHER): Payer: Medicare Other | Admitting: Medical

## 2014-03-08 VITALS — BP 128/68 | HR 110 | Temp 97.7°F | Ht 72.0 in | Wt 243.0 lb

## 2014-03-08 DIAGNOSIS — F329 Major depressive disorder, single episode, unspecified: Secondary | ICD-10-CM

## 2014-03-08 DIAGNOSIS — D72829 Elevated white blood cell count, unspecified: Secondary | ICD-10-CM

## 2014-03-08 DIAGNOSIS — K8020 Calculus of gallbladder without cholecystitis without obstruction: Secondary | ICD-10-CM

## 2014-03-08 DIAGNOSIS — F32A Depression, unspecified: Secondary | ICD-10-CM

## 2014-03-08 DIAGNOSIS — K819 Cholecystitis, unspecified: Secondary | ICD-10-CM

## 2014-03-08 DIAGNOSIS — E119 Type 2 diabetes mellitus without complications: Secondary | ICD-10-CM

## 2014-03-08 DIAGNOSIS — J449 Chronic obstructive pulmonary disease, unspecified: Secondary | ICD-10-CM

## 2014-03-08 DIAGNOSIS — K810 Acute cholecystitis: Secondary | ICD-10-CM

## 2014-03-08 DIAGNOSIS — K219 Gastro-esophageal reflux disease without esophagitis: Secondary | ICD-10-CM

## 2014-03-08 DIAGNOSIS — E785 Hyperlipidemia, unspecified: Secondary | ICD-10-CM

## 2014-03-08 HISTORY — DX: Gastro-esophageal reflux disease without esophagitis: K21.9

## 2014-03-08 HISTORY — DX: Depression, unspecified: F32.A

## 2014-03-08 LAB — COMPREHENSIVE METABOLIC PANEL
ALBUMIN: 3.6 g/dL (ref 3.5–5.2)
ALT: 32 U/L (ref 0–53)
AST: 21 U/L (ref 0–37)
Alkaline Phosphatase: 71 U/L (ref 39–117)
BUN: 20 mg/dL (ref 6–23)
CALCIUM: 10.4 mg/dL (ref 8.4–10.5)
CHLORIDE: 103 meq/L (ref 96–112)
CO2: 31 mEq/L (ref 19–32)
CREATININE: 0.99 mg/dL (ref 0.40–1.50)
GFR: 76.84 mL/min (ref >60.00)
GLUCOSE: 83 mg/dL (ref 70–99)
Potassium: 4.5 mEq/L (ref 3.5–5.1)
Sodium: 139 mEq/L (ref 135–145)
Total Bilirubin: 0.4 mg/dL (ref 0.2–1.2)
Total Protein: 7.2 g/dL (ref 6.0–8.3)

## 2014-03-08 LAB — CBC WITH DIFFERENTIAL/PLATELET
BASOS ABS: 0.1 10*3/uL (ref 0.0–0.1)
Basophils Relative: 0.5 % (ref 0.0–3.0)
EOS PCT: 2.7 % (ref 0.0–5.0)
Eosinophils Absolute: 0.3 10*3/uL (ref 0.0–0.7)
HCT: 47.7 % (ref 39.0–52.0)
Hemoglobin: 15.7 g/dL (ref 13.0–17.0)
LYMPHS PCT: 21 % (ref 12.0–46.0)
Lymphs Abs: 2.5 10*3/uL (ref 0.7–4.0)
MCHC: 32.8 g/dL (ref 30.0–36.0)
MCV: 93.2 fl (ref 78.0–100.0)
MONOS PCT: 7.8 % (ref 3.0–12.0)
Monocytes Absolute: 0.9 10*3/uL (ref 0.1–1.0)
Neutro Abs: 8.2 10*3/uL — ABNORMAL HIGH (ref 1.4–7.7)
Neutrophils Relative %: 68 % (ref 43.0–77.0)
PLATELETS: 378 10*3/uL (ref 150.0–400.0)
RBC: 5.12 Mil/uL (ref 4.22–5.81)
RDW: 15 % (ref 11.5–15.5)
WBC: 12.1 10*3/uL — ABNORMAL HIGH (ref 4.0–10.5)

## 2014-03-08 MED ORDER — CLONAZEPAM 0.25 MG PO TBDP: ORAL_TABLET | ORAL | Status: DC

## 2014-03-08 NOTE — Progress Notes (Signed)
Pre visit review using our clinic review tool, if applicable. No additional management support is needed unless otherwise documented below in the visit note. 

## 2014-03-08 NOTE — Assessment & Plan Note (Signed)
Pt is on arcapta and oxygen.

## 2014-03-08 NOTE — Assessment & Plan Note (Signed)
Continue protonix  

## 2014-03-08 NOTE — Assessment & Plan Note (Signed)
Pt has drain tube placed in hospital. Will check his wbc and follow how he is doing clinically before he sees Psychologist, sport and exercise. Will call pt on labs and he will update on if any abdominal signs or symptoms. Currently he feels great.

## 2014-03-08 NOTE — Patient Instructions (Signed)
Continue with current meds for  chronic medical problems.  For easy frustration and possible anxiety, I will give very low dose clonazepam to use strategically and only when family present to monitor.(prefer pt not self administer).  For recent cholecystitis, elevated wbc and diverticulitis, will get cbc and CMP today. Then decided if further antibiotic needed. If signs or symptoms change notify. Severe changes then ED eval.  Follow up in 2 weeks or as needed.

## 2014-03-08 NOTE — Assessment & Plan Note (Signed)
Continue lipitor  ?

## 2014-03-08 NOTE — Assessment & Plan Note (Signed)
Recent a1- less than 7. Continue metformin.

## 2014-03-08 NOTE — Progress Notes (Signed)
Subjective:    Patient ID: Lance Powell, male    DOB: 05-12-1929, 81 y.o.   MRN: [id number]  HPI   I have reviewed pt PMH, PSH, FH, Social History and Surgical History  Copd- Pt had dx for 5 yrs. Just got put on oxygen. Pt does not want to be on oxygen. Pt stopped smoking 20 yrs ago. He smoked 35 years. 2 packs a day or more. Pt previously hx of o2 sat% was always.   Diabetic- metformin. He is on.  Depression- paxil has helped him. Pt trouble sleeping in past. CPAP has helped him.  Htn- history of some recently. And put on mediation for that.  Gerd- Pt on protonix.  Hyperlipdemia- pt on lipitor.  PSH- Hernia repairs bilateral inguinal, appendectomey, Nissan fundoplication.    Pt  had wbc elevation, gallbladder disease and diverticulitis. Pt had drain placed in hosptial. Pt wbc gradually decreased. Pt feels great now. No fever, no chills, no nausea or vomiting.  Pt was dishcharged on amoxcillin. Day 7 since discharge from hospital.  They have plans to eventually to get gallbladder surgery. Pt has appointment with surgeon. Will see Dr. Redmond Pulling on March 9th.  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 Basically cut back 25 yrs ago. Now rare and occasional. Wife has dementia.        Review of Systems  Constitutional: Negative for fever, chills, diaphoresis, activity change and fatigue.  Respiratory: Positive for shortness of breath. Negative for cough and chest tightness.        Chronic from copd.  Cardiovascular: Negative for chest pain, palpitations and leg swelling.  Gastrointestinal: Negative for nausea, vomiting, abdominal pain, diarrhea, constipation and blood in stool.       Pt states history states no more pain in his left lower quadrant. Or rt upper quadrant.  Musculoskeletal: Negative for neck pain and neck stiffness.  Neurological: Negative for dizziness, tremors, seizures, syncope, facial asymmetry, speech difficulty, weakness, light-headedness,  numbness and headaches.  Psychiatric/Behavioral: Negative for behavioral problems, confusion and agitation. The patient is not nervous/anxious.        Objective:   Physical Exam  General Appearance- Not in acute distress.  HEENT Eyes- Scleraeral/Conjuntiva-bilat- Not Yellow. Mouth & Throat- Normal.  Chest and Lung Exam Auscultation: Breath sounds:-Normal. Adventitious sounds:- No Adventitious sounds.  Cardiovascular Auscultation:Rythm - Regular. Heart Sounds -Normal heart sounds.  Abdomen Inspection:-Inspection Normal.  Palpation/Perucssion: Palpation and Percussion of the abdomen reveal- Non Tender, No Rebound tenderness, No rigidity(Guarding) and No Palpable abdominal masses.  Liver:-Normal.  Spleen:- Normal.   Back- no cva tenderness.  Past Medical History  Diagnosis Date  . Osteoarthritis   . Rheumatoid arthritis(714.0)   . Hyperlipemia   . Diabetes mellitus   . COPD (chronic obstructive pulmonary disease)   . Sleep apnea     cpap  . Hypertension     History   Social History  . Marital Status: Married    Spouse Name: N/A  . Number of Children: 6  . Years of Education: N/A   Occupational History  . Retired     Chief Financial Officer   Social History Main Topics  . Smoking status: Former Smoker -- 4.00 packs/day for 30 years    Types: Cigarettes    Quit date: 01/21/1978  . Smokeless tobacco: Never Used  . Alcohol Use: No  . Drug Use: No  . Sexual Activity: Not on file   Other Topics Concern  . Not on file  Social History Narrative    Past Surgical History  Procedure Laterality Date  . Appendectomy  1969  . Nissen fundoplication    . Hernia repair  1990    right and left inguinal  . Vasectomy  1972  . Tonsillectomy  1937  . Transurethral resection of prostate  01/23/2011    Procedure: TRANSURETHRAL RESECTION OF THE PROSTATE WITH GYRUS INSTRUMENTS;  Surgeon: Molli Hazard, MD;  Location[facility];  Service: Urology;  Laterality:  N/A;  . Cystoscopy  01/23/2011    Procedure: CYSTOSCOPY;  Surgeon: Molli Hazard, MD;  Location: [facility];  Service: Urology;  Laterality: N/A;  . Flexible sigmoidoscopy  01/07/2012    Procedure: FLEXIBLE SIGMOIDOSCOPY;  Surgeon: Ladene Artist, MD,FACG;  Location: [facility];  Service: Endoscopy;  Laterality: N/A;  . Sigmoidectomy  2004    colovesical fistula    Family History  Problem Relation Age of Onset  . Emphysema Brother   . Heart disease Mother   . Cancer Daughter     breast    No Known Allergies  Current Outpatient Prescriptions on File Prior to Visit  Medication Sig Dispense Refill  . amoxicillin-clavulanate (AUGMENTIN) 875-125 MG per tablet Take 1 tablet by mouth 2 (two) times daily. (Patient not taking: Reported on 03/08/2014) 14 tablet 0  . aspirin 81 MG tablet Take 1 tablet (81 mg total) by mouth every morning. Stop for 1 week and then resume    . atorvastatin (LIPITOR) 40 MG tablet Take 40 mg by mouth every morning.     . celecoxib (CELEBREX) 200 MG capsule Take 1 capsule (200 mg total) by mouth every evening. Hold for the next 5 days and then resume    . cholecalciferol (VITAMIN D) 1000 UNITS tablet Take 2,000 Units by mouth 2 (two) times daily.     . enalapril (VASOTEC) 2.5 MG tablet Take 2.5 mg by mouth daily.    . Indacaterol Maleate (ARCAPTA NEOHALER) 75 MCG CAPS Place 1 capsule into inhaler and inhale every morning. 90 capsule 1  . metFORMIN (GLUCOPHAGE) 500 MG tablet Take 500 mg by mouth 3 (three) times daily.    . misoprostol (CYTOTEC) 100 MCG tablet Take 100-200 mcg by mouth 4 (four) times daily. Takes 1 tab in am, 1 tab in pm, 2 tabs at bedtime    . Multiple Vitamins-Minerals (CENTRUM) tablet Take 1 tablet by mouth daily.      . pantoprazole (PROTONIX) 40 MG tablet Take 1 tablet (40 mg total) by mouth daily. 30 tablet 1  . PARoxetine (PAXIL) 20 MG tablet Take 20 mg by mouth every morning.      . vitamin C (ASCORBIC ACID) 500 MG  tablet Take 500 mg by mouth daily.     No current facility-administered medications on file prior to visit.    BP 128/68 mmHg  Pulse 110  Temp(Src) 97.7 F (36.5 C) (Oral)  Ht 6' (1.829 m)  Wt 243 lb (110.224 kg)  BMI 32.95 kg/m2  SpO2 91%       Assessment & Plan:

## 2014-03-08 NOTE — Assessment & Plan Note (Addendum)
Continue paxil. Occasional anxiety and agitation. Low dose rx of clonazapam if needed(see avs instructions)

## 2014-03-09 ENCOUNTER — Telehealth: Payer: Self-pay | Admitting: Medical

## 2014-03-09 DIAGNOSIS — D72829 Elevated white blood cell count, unspecified: Secondary | ICD-10-CM

## 2014-03-09 NOTE — Telephone Encounter (Signed)
Future lab for increase wbc.

## 2014-03-15 ENCOUNTER — Other Ambulatory Visit (INDEPENDENT_AMBULATORY_CARE_PROVIDER_SITE_OTHER): Payer: Medicare Other

## 2014-03-15 DIAGNOSIS — D72829 Elevated white blood cell count, unspecified: Secondary | ICD-10-CM

## 2014-03-15 LAB — CBC WITH DIFFERENTIAL/PLATELET
BASOS PCT: 0.6 % (ref 0.0–3.0)
Basophils Absolute: 0.1 10*3/uL (ref 0.0–0.1)
EOS PCT: 2.4 % (ref 0.0–5.0)
Eosinophils Absolute: 0.2 10*3/uL (ref 0.0–0.7)
HCT: 48.3 % (ref 39.0–52.0)
HEMOGLOBIN: 16.1 g/dL (ref 13.0–17.0)
LYMPHS PCT: 29.8 % (ref 12.0–46.0)
Lymphs Abs: 2.6 10*3/uL (ref 0.7–4.0)
MCHC: 33.3 g/dL (ref 30.0–36.0)
MCV: 91.9 fl (ref 78.0–100.0)
Monocytes Absolute: 0.7 10*3/uL (ref 0.1–1.0)
Monocytes Relative: 8.4 % (ref 3.0–12.0)
Neutro Abs: 5.1 10*3/uL (ref 1.4–7.7)
Neutrophils Relative %: 58.8 % (ref 43.0–77.0)
Platelets: 321 10*3/uL (ref 150.0–400.0)
RBC: 5.26 Mil/uL (ref 4.22–5.81)
RDW: 14.9 % (ref 11.5–15.5)
WBC: 8.7 10*3/uL (ref 4.0–10.5)

## 2014-03-22 ENCOUNTER — Ambulatory Visit (INDEPENDENT_AMBULATORY_CARE_PROVIDER_SITE_OTHER): Payer: Medicare Other | Admitting: Medical

## 2014-03-22 ENCOUNTER — Encounter: Payer: Self-pay | Admitting: Medical

## 2014-03-22 VITALS — BP 129/74 | HR 110 | Temp 97.6°F | Ht 72.0 in | Wt 238.6 lb

## 2014-03-22 DIAGNOSIS — I10 Essential (primary) hypertension: Secondary | ICD-10-CM | POA: Diagnosis not present

## 2014-03-22 DIAGNOSIS — K8000 Calculus of gallbladder with acute cholecystitis without obstruction: Secondary | ICD-10-CM

## 2014-03-22 DIAGNOSIS — J438 Other emphysema: Secondary | ICD-10-CM | POA: Diagnosis not present

## 2014-03-22 MED ORDER — ENALAPRIL MALEATE 2.5 MG PO TABS: 2.5000 mg | ORAL_TABLET | Freq: Every day | ORAL | Status: DC

## 2014-03-22 NOTE — Patient Instructions (Signed)
Hypertension Refill his enalapril 2.5 mg 1 tab a day.   COPD (chronic obstructive pulmonary disease) Refill his proair at pt request he will continue O2 at home. Will refer back to pulmonologist.    Calculus of gallbladder with acute cholecystitis WBC now normal and pt is asymptomatic. He will follow up with surgeon to decide on procedure that needs to be done.     Please come in for follow up 1-2 weeks and to discuss form for help from [place]. Please make 30 minute appointment.

## 2014-03-22 NOTE — Assessment & Plan Note (Signed)
Refill his enalapril 2.5 mg 1 tab a day.

## 2014-03-22 NOTE — Assessment & Plan Note (Signed)
Refill his proair at pt request he will continue O2 at home. Will refer back to pulmonologist.

## 2014-03-22 NOTE — Assessment & Plan Note (Signed)
WBC now normal and pt is asymptomatic. He will follow up with surgeon to decide on procedure that needs to be done.

## 2014-03-22 NOTE — Progress Notes (Signed)
Pre visit review using our clinic review tool, if applicable. No additional management support is needed unless otherwise documented below in the visit note. 

## 2014-03-22 NOTE — Progress Notes (Signed)
Subjective:    Patient ID: Lance Powell, male    DOB: November 08, 1929, 81 y.o.   MRN: [id number]  HPI   Pt in for follow up. Pt leukocytosis has resolved. Pt has upcoming surgery on his gallbladder. Pt surgery date for gallbladder procedure yet to be determined.  Pt needs enalapril refill. Pt has good blood pressure meds.  Pt also needs form filled out for [place]. This would enable aid and attendance. This benefit through [place]. His wife has dementia. Pt can't afford to pay for that. I explained to family can attest to medical condition of pt.  Pt has copd and he is on on oxygen continuous.  Pt has pulmonologist. That appointment was before hospitalization. Dr. [name] with [facility]. Without oxygen his o2 is in the high 80's/low 90's. He needs refill of the proir.  Pt has some frustration dealing with wife dementia. Gave card of counselor for daughter to call.      Review of Systems  Constitutional: Negative for chills, fatigue and unexpected weight change.  Respiratory: Positive for shortness of breath. Negative for cough and chest tightness.        Daily but improved with use of 02 and oxygen.  Cardiovascular: Negative for chest pain and palpitations.  Gastrointestinal: Negative for nausea, vomiting, abdominal pain, diarrhea, constipation, blood in stool, abdominal distention and anal bleeding.  Genitourinary: Negative for dysuria, urgency, frequency, hematuria, flank pain, scrotal swelling, enuresis, difficulty urinating, genital sores and testicular pain.  Musculoskeletal: Negative for back pain.  Skin: Negative for color change.  Neurological: Negative for dizziness, seizures, syncope, facial asymmetry, speech difficulty, weakness, light-headedness, numbness and headaches.  Hematological: Negative for adenopathy. Does not bruise/bleed easily.  Psychiatric/Behavioral: Negative for behavioral problems, confusion and agitation.    Past Medical History  Diagnosis Date  .  Osteoarthritis   . Rheumatoid arthritis(714.0)   . Hyperlipemia   . Diabetes mellitus   . COPD (chronic obstructive pulmonary disease)   . Sleep apnea     cpap  . Hypertension   . Depression 03/08/2014  . GERD (gastroesophageal reflux disease) 03/08/2014    History   Social History  . Marital Status: Married    Spouse Name: N/A  . Number of Children: 6  . Years of Education: N/A   Occupational History  . Retired     Chief Financial Officer   Social History Main Topics  . Smoking status: Former Smoker -- 4.00 packs/day for 30 years    Types: Cigarettes    Quit date: 01/21/1978  . Smokeless tobacco: Never Used  . Alcohol Use: No  . Drug Use: No  . Sexual Activity: Not on file   Other Topics Concern  . Not on file   Social History Narrative    Past Surgical History  Procedure Laterality Date  . Appendectomy  1969  . Nissen fundoplication    . Hernia repair  1990    right and left inguinal  . Vasectomy  1972  . Tonsillectomy  1937  . Transurethral resection of prostate  01/23/2011    Procedure: TRANSURETHRAL RESECTION OF THE PROSTATE WITH GYRUS INSTRUMENTS;  Surgeon: Molli Hazard, MD;  Location: [facility];  Service: Urology;  Laterality: N/A;  . Cystoscopy  01/23/2011    Procedure: CYSTOSCOPY;  Surgeon: Molli Hazard, MD;  Location: [facility];  Service: Urology;  Laterality: N/A;  . Flexible sigmoidoscopy  01/07/2012    Procedure: FLEXIBLE SIGMOIDOSCOPY;  Surgeon: Ladene Artist, MD,FACG;  Location:  [facility];  Service: Endoscopy;  Laterality: N/A;  . Sigmoidectomy  2004    colovesical fistula    Family History  Problem Relation Age of Onset  . Emphysema Brother   . Heart disease Mother   . Cancer Daughter     breast    No Known Allergies  Current Outpatient Prescriptions on File Prior to Visit  Medication Sig Dispense Refill  . aspirin 81 MG tablet Take 1 tablet (81 mg total) by mouth every morning. Stop for 1 week  and then resume    . atorvastatin (LIPITOR) 40 MG tablet Take 40 mg by mouth every morning.     . celecoxib (CELEBREX) 200 MG capsule Take 1 capsule (200 mg total) by mouth every evening. Hold for the next 5 days and then resume    . cholecalciferol (VITAMIN D) 1000 UNITS tablet Take 2,000 Units by mouth 2 (two) times daily.     . clonazePAM (KLONOPIN) 0.25 MG disintegrating tablet 1 tab po bid prn anxiety or agitation. 20 tablet 0  . enalapril (VASOTEC) 2.5 MG tablet Take 2.5 mg by mouth daily.    . Indacaterol Maleate (ARCAPTA NEOHALER) 75 MCG CAPS Place 1 capsule into inhaler and inhale every morning. 90 capsule 1  . metFORMIN (GLUCOPHAGE) 500 MG tablet Take 500 mg by mouth 3 (three) times daily.    . misoprostol (CYTOTEC) 100 MCG tablet Take 100-200 mcg by mouth 4 (four) times daily. Takes 1 tab in am, 1 tab in pm, 2 tabs at bedtime    . Multiple Vitamins-Minerals (CENTRUM) tablet Take 1 tablet by mouth daily.      . pantoprazole (PROTONIX) 40 MG tablet Take 1 tablet (40 mg total) by mouth daily. 30 tablet 1  . PARoxetine (PAXIL) 20 MG tablet Take 20 mg by mouth every morning.      . vitamin C (ASCORBIC ACID) 500 MG tablet Take 500 mg by mouth daily.     No current facility-administered medications on file prior to visit.    BP 129/74 mmHg  Pulse 110  Temp(Src) 97.6 F (36.4 C) (Oral)  Ht 6' (1.829 m)  Wt 238 lb 9.6 oz (108.228 kg)  BMI 32.35 kg/m2  SpO2 95%      Objective:   Physical Exam  .General Mental Status- Alert. General Appearance- Not in acute distress.   Skin General: Color- Normal Color. Moisture- Normal Moisture.  Neck Carotid Arteries- Normal color. Moisture- Normal Moisture. No carotid bruits. No JVD.  Chest and Lung Exam Auscultation: Breath Sounds:-Normal.  Cardiovascular Auscultation:Rythm- Regular. Murmurs & Other Heart Sounds:Auscultation of the heart reveals- No Murmurs.  Abdomen Inspection:-Inspeection Normal.(drain present  ruq0 Palpation/Percussion:Note:No mass. Palpation and Percussion of the abdomen reveal- Non Tender, Non Distended + BS, no rebound or guarding.    Neurologic Cranial Nerve exam:- CN III-XII intact(No nystagmus), symmetric smile. Strength:- 5/5 equal and symmetric strength both upper and lower extremities.     Assessment & Plan:

## 2014-03-23 ENCOUNTER — Telehealth: Payer: Self-pay

## 2014-03-23 ENCOUNTER — Encounter: Payer: Self-pay | Admitting: Medical

## 2014-03-23 ENCOUNTER — Telehealth: Payer: Self-pay | Admitting: Medical

## 2014-03-23 MED ORDER — ENALAPRIL MALEATE 2.5 MG PO TABS: 2.5000 mg | ORAL_TABLET | Freq: Every day | ORAL | Status: DC

## 2014-03-23 MED ORDER — ALBUTEROL SULFATE HFA 108 (90 BASE) MCG/ACT IN AERS: 2.0000 | INHALATION_SPRAY | Freq: Four times a day (QID) | RESPIRATORY_TRACT | Status: DC | PRN

## 2014-03-23 NOTE — Telephone Encounter (Signed)
rx his proair.

## 2014-03-23 NOTE — Telephone Encounter (Signed)
Refilled enalapril

## 2014-03-23 NOTE — Telephone Encounter (Signed)
Caller name: Jeet Relation to pt: self Call back number: [phone] Pharmacy:[facility] [address]  Reason for call:   Patient states that the pharmacy did not receive inhaler and enalapril rx from yesterdays visit.

## 2014-03-24 NOTE — Telephone Encounter (Signed)
Patient notified- Rx sent to pharmacy. 

## 2014-03-24 NOTE — Telephone Encounter (Signed)
PAtient notified Rx sent to pharmacy.

## 2014-03-28 ENCOUNTER — Telehealth: Payer: Self-pay | Admitting: Pulmonary Disease

## 2014-03-28 NOTE — Telephone Encounter (Signed)
Pt's appointment has been moved to [facility] schedule tomorrow at 11:15am.

## 2014-03-29 ENCOUNTER — Encounter: Payer: Self-pay | Admitting: Pulmonary Disease

## 2014-03-29 ENCOUNTER — Ambulatory Visit (INDEPENDENT_AMBULATORY_CARE_PROVIDER_SITE_OTHER): Payer: Medicare Other | Admitting: Pulmonary Disease

## 2014-03-29 VITALS — BP 118/60 | HR 118 | Temp 97.0°F | Ht 72.0 in | Wt 238.6 lb

## 2014-03-29 DIAGNOSIS — G4733 Obstructive sleep apnea (adult) (pediatric): Secondary | ICD-10-CM

## 2014-03-29 DIAGNOSIS — J438 Other emphysema: Secondary | ICD-10-CM

## 2014-03-29 NOTE — Patient Instructions (Signed)
You need to stay on oxygen while away from home, doing exertional things around the house, and during sleep.  You do not have to wear if sitting down.   Make sure you keep up with mask cushion changes. Will try on stiolto 2 inhalations every am for next 4 weeks.  If you think it is helping you, can send in prescription. You are at moderate risk for post operative pulmonary complications associated with abdominal surgery. followup with me about 2 weeks after your surgical procedure.

## 2014-03-29 NOTE — Progress Notes (Signed)
   Subjective:    Patient ID: Lance Powell, male    DOB: 1929/08/24, 81 y.o.   MRN: [id number]  HPI The patient comes in today for follow-up of his known COPD and obstructive sleep apnea. He was recently in the hospital for acute cholecystitis and diverticulitis, and currently has a biliary drain in place. He is being scheduled for a cholecystectomy. The good news here is that his breathing has remained fairly stable on arcapta alone, and he denies any increased shortness of breath or congestion. His oxygen level has always been borderline with exertion, but he has not wanted to stay on supplemental oxygen. He was discharged from the hospital after his acute abdominal process, and felt to need oxygen 24 hours a day. His saturations on room air are adequate today.   Review of Systems  Constitutional: Negative for fever and unexpected weight change.  HENT: Negative for congestion, dental problem, ear pain, nosebleeds, postnasal drip, rhinorrhea, sinus pressure, sneezing, sore throat and trouble swallowing.   Eyes: Negative for redness and itching.  Respiratory: Positive for shortness of breath. Negative for cough, chest tightness and wheezing.   Cardiovascular: Negative for palpitations and leg swelling.  Gastrointestinal: Negative for nausea and vomiting.  Genitourinary: Negative for dysuria.  Musculoskeletal: Negative for joint swelling.  Skin: Negative for rash.  Neurological: Negative for headaches.  Hematological: Does not bruise/bleed easily.  Psychiatric/Behavioral: Negative for dysphoric mood. The patient is not nervous/anxious.        Objective:   Physical Exam Overweight male in no acute distress Nose without purulence or discharge noted Neck without lymphadenopathy or thyromegaly No skin breakdown or pressure necrosis from the C Pap mask Chest with decreased breath sounds, no active wheezing Cardiac exam with regular rate and rhythm Lower extremities without significant  edema, no cyanosis Alert and oriented, moves all 4 extremities.       Assessment & Plan:

## 2014-03-29 NOTE — Assessment & Plan Note (Signed)
The patient has done well overall from a COPD standpoint, and has never seen any benefit from staying on a consistent bronchodilator regimen. He has never tried stiolto, and he also has upcoming abdominal surgery. Because of this, I would like for him to try and stay on a consistent bronchodilator regimen over the next 4 weeks. We can then make a decision whether he needs to stay on this or not. He does not need inhaled corticosteroids since he rarely ever has an acute exacerbation.

## 2014-03-29 NOTE — Assessment & Plan Note (Signed)
The patient continues on C Pap compliantly, but is having some mask leak issues at times with breakthrough. He admits that he has not kept up with his mask cushion changes, and I have encouraged him to do so.

## 2014-03-30 ENCOUNTER — Other Ambulatory Visit (INDEPENDENT_AMBULATORY_CARE_PROVIDER_SITE_OTHER): Payer: Self-pay

## 2014-03-30 ENCOUNTER — Encounter: Payer: Self-pay | Admitting: Medical

## 2014-03-30 ENCOUNTER — Ambulatory Visit (INDEPENDENT_AMBULATORY_CARE_PROVIDER_SITE_OTHER): Payer: Medicare Other | Admitting: Medical

## 2014-03-30 ENCOUNTER — Other Ambulatory Visit (INDEPENDENT_AMBULATORY_CARE_PROVIDER_SITE_OTHER): Payer: Self-pay | Admitting: General Surgery

## 2014-03-30 ENCOUNTER — Ambulatory Visit (HOSPITAL_BASED_OUTPATIENT_CLINIC_OR_DEPARTMENT_OTHER)
Admission: RE | Admit: 2014-03-30 | Discharge: 2014-03-30 | Disposition: A | Discharge status: Home / Self Care | Payer: Medicare Other | Source: Ambulatory Visit | Attending: Medical | Admitting: Medical

## 2014-03-30 VITALS — BP 110/68 | HR 94 | Temp 97.7°F | Wt 242.8 lb

## 2014-03-30 DIAGNOSIS — M533 Sacrococcygeal disorders, not elsewhere classified: Secondary | ICD-10-CM | POA: Insufficient documentation

## 2014-03-30 DIAGNOSIS — K810 Acute cholecystitis: Secondary | ICD-10-CM | POA: Diagnosis not present

## 2014-03-30 DIAGNOSIS — Z01810 Encounter for preprocedural cardiovascular examination: Secondary | ICD-10-CM

## 2014-03-30 DIAGNOSIS — Z434 Encounter for attention to other artificial openings of digestive tract: Secondary | ICD-10-CM

## 2014-03-30 DIAGNOSIS — W19XXXA Unspecified fall, initial encounter: Secondary | ICD-10-CM | POA: Diagnosis not present

## 2014-03-30 NOTE — Progress Notes (Signed)
Subjective:    Patient ID: Lance Powell, male    DOB: 11-02-1929, 81 y.o.   MRN: [id number]  HPI   Pt in to drop off form for evaluation getting assistance from [place]. Discussed with daughter. Who is aware of his function as she helps him a lot over last 2 weeks. And helping him actively involved with his care on and off since November.  Pt just recently fell and hurt his coccyx on Sunday afternoon. He leaned forward and chair slipped. Now when sitting down the coccyx hurts. Not severe pain. No other pelvic region type pain. No hip pain reported.  Upcoming gallbladder sx. He needs cardiac clearance. He has appointment with Dr. Arlyce Dice.   He has drainage tube still in place.     Review of Systems  Constitutional: Negative for fever, chills, diaphoresis, activity change and fatigue.  Respiratory: Negative for cough, chest tightness and shortness of breath.   Cardiovascular: Negative for chest pain, palpitations and leg swelling.  Gastrointestinal: Negative for nausea, vomiting and abdominal pain.  Musculoskeletal: Negative for neck pain and neck stiffness.       Coccyx pain.  Neurological: Negative for dizziness, tremors, seizures, syncope, facial asymmetry, speech difficulty, weakness, light-headedness, numbness and headaches.  Psychiatric/Behavioral: Negative for behavioral problems, confusion and agitation. The patient is not nervous/anxious.     Past Medical History  Diagnosis Date  . Osteoarthritis   . Rheumatoid arthritis(714.0)   . Hyperlipemia   . Diabetes mellitus   . COPD (chronic obstructive pulmonary disease)   . Sleep apnea     cpap  . Hypertension   . Depression 03/08/2014  . GERD (gastroesophageal reflux disease) 03/08/2014    History   Social History  . Marital Status: Married    Spouse Name: N/A  . Number of Children: 6  . Years of Education: N/A   Occupational History  . Retired     Chief Financial Officer   Social History Main Topics  . Smoking status: Former  Smoker -- 4.00 packs/day for 30 years    Types: Cigarettes    Quit date: 01/21/1978  . Smokeless tobacco: Never Used  . Alcohol Use: No  . Drug Use: No  . Sexual Activity: Not on file   Other Topics Concern  . Not on file   Social History Narrative    Past Surgical History  Procedure Laterality Date  . Appendectomy  1969  . Nissen fundoplication    . Hernia repair  1990    right and left inguinal  . Vasectomy  1972  . Tonsillectomy  1937  . Transurethral resection of prostate  01/23/2011    Procedure: TRANSURETHRAL RESECTION OF THE PROSTATE WITH GYRUS INSTRUMENTS;  Surgeon: Molli Hazard, MD;  Location: [facility];  Service: Urology;  Laterality: N/A;  . Cystoscopy  01/23/2011    Procedure: CYSTOSCOPY;  Surgeon: Molli Hazard, MD;  Location: [facility];  Service: Urology;  Laterality: N/A;  . Flexible sigmoidoscopy  01/07/2012    Procedure: FLEXIBLE SIGMOIDOSCOPY;  Surgeon: Ladene Artist, MD,FACG;  Location: [facility];  Service: Endoscopy;  Laterality: N/A;  . Sigmoidectomy  2004    colovesical fistula    Family History  Problem Relation Age of Onset  . Emphysema Brother   . Heart disease Mother   . Cancer Daughter     breast    No Known Allergies  Current Outpatient Prescriptions on File Prior to Visit  Medication Sig Dispense Refill  .  albuterol (PROVENTIL HFA;VENTOLIN HFA) 108 (90 BASE) MCG/ACT inhaler Inhale 2 puffs into the lungs every 6 (six) hours as needed for wheezing or shortness of breath. 1 Inhaler 2  . aspirin 81 MG tablet Take 1 tablet (81 mg total) by mouth every morning. Stop for 1 week and then resume    . atorvastatin (LIPITOR) 40 MG tablet Take 40 mg by mouth every morning.     . celecoxib (CELEBREX) 200 MG capsule Take 1 capsule (200 mg total) by mouth every evening. Hold for the next 5 days and then resume    . cholecalciferol (VITAMIN D) 1000 UNITS tablet Take 2,000 Units by mouth 2 (two) times  daily.     . clonazePAM (KLONOPIN) 0.25 MG disintegrating tablet 1 tab po bid prn anxiety or agitation. 20 tablet 0  . enalapril (VASOTEC) 2.5 MG tablet Take 1 tablet (2.5 mg total) by mouth daily. 30 tablet 11  . Indacaterol Maleate (ARCAPTA NEOHALER) 75 MCG CAPS Place 1 capsule into inhaler and inhale every morning. 90 capsule 1  . metFORMIN (GLUCOPHAGE) 500 MG tablet Take 500 mg by mouth 3 (three) times daily.    . misoprostol (CYTOTEC) 100 MCG tablet Take 100-200 mcg by mouth 4 (four) times daily. Takes 1 tab in am, 1 tab in pm, 2 tabs at bedtime    . Multiple Vitamins-Minerals (CENTRUM) tablet Take 1 tablet by mouth daily.      . pantoprazole (PROTONIX) 40 MG tablet Take 1 tablet (40 mg total) by mouth daily. 30 tablet 1  . PARoxetine (PAXIL) 20 MG tablet Take 20 mg by mouth every morning.      . vitamin C (ASCORBIC ACID) 500 MG tablet Take 500 mg by mouth daily.     No current facility-administered medications on file prior to visit.    BP 110/68 mmHg  Pulse 94  Temp(Src) 97.7 F (36.5 C) (Oral)  Wt 242 lb 12.8 oz (110.133 kg)  SpO2 95%       Objective:   Physical Exam  General- No acute distress. Pleasant patient. Neck- Full range of motion, no jvd Lungs- Clear, even and unlabored. Heart- regular rate and rhythm. Neurologic- CNII- XII grossly intact.(But he has poor mobility) Back- coccyx pain to palpation. Faint bruise appearance.       Assessment & Plan:

## 2014-03-30 NOTE — Patient Instructions (Addendum)
We will get coccyx xray today.  I will [facility] forms for [facility] and then call you.  I will investigate if I can make timely referral to cardiologist associated with our clinic.  Follow up 2-3 weeks post surgery or as needed.  Note diagnosis for perioperative clearance was put in for referral purposes to cardiologist.I did not perform this today.

## 2014-03-30 NOTE — Assessment & Plan Note (Signed)
Will get xray today to assess if function. No med added today for pain. But thought get xray to see if possible fx. Pt handles pain well if sits appropriately. If pain worsen then consider new medication.

## 2014-03-30 NOTE — Assessment & Plan Note (Signed)
Hx of and doing well now. Pt has seen surgeon and preparation for surgical being done. He needs cardiac clearance first. They have appointment on 12 April with Dr. Arlyce Dice. They request [facility] affiliated Psychologist, sport and exercise. So I will investigate this.

## 2014-03-31 ENCOUNTER — Telehealth: Payer: Self-pay | Admitting: Pulmonary Disease

## 2014-03-31 NOTE — Telephone Encounter (Signed)
Spoke with Beth at [facility]. States that when she saw the pt today his oxygen level dropped to 80% on 2L/min with exertion. Wanted to get KC's recs on what to do for the patient.  [place] - please advise. Thanks.

## 2014-03-31 NOTE — Telephone Encounter (Signed)
LMTCB

## 2014-03-31 NOTE — Telephone Encounter (Signed)
Can increase O2 to 3 liters with exertion only.

## 2014-04-01 NOTE — Telephone Encounter (Signed)
lmomtcb x1 for [employer]

## 2014-04-01 NOTE — Telephone Encounter (Signed)
Return call.Lance Powell °

## 2014-04-01 NOTE — Telephone Encounter (Signed)
Spoke with [facility] - aware of O2 changes per Dr [name]. Nothing further needed.

## 2014-04-05 ENCOUNTER — Ambulatory Visit: Payer: Medicare Other | Admitting: Adult Health

## 2014-04-06 ENCOUNTER — Telehealth: Payer: Self-pay | Admitting: Medical

## 2014-04-06 ENCOUNTER — Other Ambulatory Visit: Payer: Self-pay | Admitting: Radiology

## 2014-04-06 NOTE — Telephone Encounter (Signed)
Note to referral staff on cardiology appointment

## 2014-04-07 ENCOUNTER — Ambulatory Visit (HOSPITAL_COMMUNITY): Payer: Medicare Other

## 2014-04-07 NOTE — Telephone Encounter (Signed)
Noted  

## 2014-04-08 ENCOUNTER — Ambulatory Visit (HOSPITAL_COMMUNITY): Admission: RE | Admit: 2014-04-08 | Payer: Medicare Other | Source: Ambulatory Visit

## 2014-04-08 ENCOUNTER — Other Ambulatory Visit: Payer: Self-pay | Admitting: Radiology

## 2014-04-11 ENCOUNTER — Other Ambulatory Visit (HOSPITAL_COMMUNITY): Payer: Medicare Other

## 2014-04-11 ENCOUNTER — Telehealth: Payer: Self-pay | Admitting: *Deleted

## 2014-04-11 NOTE — Telephone Encounter (Signed)
Received paperwork from [facility] for pt needing for [facility] [employer] [place]. I called pt's daughter, [name], to ask a few questions for the form. She stated she would call back at a later time today. She stated she is currently with her mother getting her to a doctors appt. I instructed her to please ask for me when she calls back. JG//CMA

## 2014-04-11 NOTE — Telephone Encounter (Signed)
Lance Powell returning call, call back [phone]

## 2014-04-12 NOTE — Telephone Encounter (Signed)
Returned call the [name] - no answer, mailbox is currently full. Will try again later. JG//CMA

## 2014-04-13 ENCOUNTER — Other Ambulatory Visit: Payer: Self-pay | Admitting: Radiology

## 2014-04-13 NOTE — Telephone Encounter (Signed)
Spoke with Lance Powell face to face today in office. Information passed on to [place]. JG//CMA

## 2014-04-14 ENCOUNTER — Ambulatory Visit (HOSPITAL_COMMUNITY)
Admission: RE | Admit: 2014-04-14 | Discharge: 2014-04-14 | Disposition: A | Discharge status: Home / Self Care | Payer: Medicare Other | Source: Ambulatory Visit | Attending: Interventional Radiology | Admitting: Interventional Radiology

## 2014-04-14 DIAGNOSIS — Z434 Encounter for attention to other artificial openings of digestive tract: Secondary | ICD-10-CM | POA: Diagnosis not present

## 2014-04-14 DIAGNOSIS — K819 Cholecystitis, unspecified: Secondary | ICD-10-CM | POA: Diagnosis not present

## 2014-04-14 MED ORDER — IOHEXOL 300 MG/ML  SOLN
50.0000 mL | Freq: Once | INTRAMUSCULAR | Status: AC | PRN
  Administered 2014-04-14: 10 mL

## 2014-04-25 NOTE — Telephone Encounter (Signed)
Late entry:  Paperwork was completed on 04/20/2014. Called and informed pt's daughter, [name], that forms are ready for pick up at our front desk. JG//CMA

## 2014-05-03 ENCOUNTER — Encounter: Payer: Self-pay | Admitting: Cardiology

## 2014-05-03 ENCOUNTER — Ambulatory Visit (INDEPENDENT_AMBULATORY_CARE_PROVIDER_SITE_OTHER): Payer: Medicare Other | Admitting: Cardiology

## 2014-05-03 ENCOUNTER — Other Ambulatory Visit: Payer: Self-pay

## 2014-05-03 VITALS — BP 90/40 | HR 95 | Ht 72.0 in | Wt 240.4 lb

## 2014-05-03 DIAGNOSIS — Z01818 Encounter for other preprocedural examination: Secondary | ICD-10-CM

## 2014-05-03 DIAGNOSIS — E785 Hyperlipidemia, unspecified: Secondary | ICD-10-CM

## 2014-05-03 DIAGNOSIS — J438 Other emphysema: Secondary | ICD-10-CM | POA: Diagnosis not present

## 2014-05-03 DIAGNOSIS — I10 Essential (primary) hypertension: Secondary | ICD-10-CM | POA: Diagnosis not present

## 2014-05-03 MED ORDER — MISOPROSTOL 100 MCG PO TABS: 100.0000 ug | ORAL_TABLET | Freq: Four times a day (QID) | ORAL | Status: DC

## 2014-05-03 NOTE — Patient Instructions (Signed)
The current medical regimen is effective;  continue present plan and medications.  Your physician has requested that you have a lexiscan myoview. For further information please visit [email or web address]. Please follow instruction sheet, as given.  Follow up as needed with Dr Marlou Porch.  Thank you for choosing [facility]!!

## 2014-05-03 NOTE — Progress Notes (Signed)
Cardiology Office Note   Date:  05/03/2014   ID:  Lance Powell, DOB [birth year], MRN [id number]  PCP:  Lance Pai, PA-C  Cardiologist:   Lance Furbish, MD       History of Present Illness: Lance Powell is a 81 y.o. male who presents for here for surgical clearance for  laparoscopic cholecystectomy. Percutaneous tube in place.   No prior heart issues. No CP. SOB at baseline. 2L home O2 at baseline. No prior MI, no CVA. Able to walk from car to store with no difficulty.   Recently put on enalapril 2.5mg . blood pressure slightly low on today's visit. No symptoms, no syncope, no orthostatic symptoms. He is likely taking this low-dose for protection against diabetic nephropathy.    he is a retired Chief Financial Officer. Used to work for a [employer]    Past Medical History  Diagnosis Date  . Osteoarthritis   . Rheumatoid arthritis(714.0)   . Hyperlipemia   . Diabetes mellitus   . COPD (chronic obstructive pulmonary disease)   . Sleep apnea     cpap  . Hypertension   . Depression 03/08/2014  . GERD (gastroesophageal reflux disease) 03/08/2014  . OSA (obstructive sleep apnea)     Past Surgical History  Procedure Laterality Date  . Appendectomy  1969  . Nissen fundoplication    . Hernia repair  1990    right and left inguinal  . Vasectomy  1972  . Tonsillectomy  1937  . Transurethral resection of prostate  01/23/2011    Procedure: TRANSURETHRAL RESECTION OF THE PROSTATE WITH GYRUS INSTRUMENTS;  Surgeon: Molli Hazard, MD;  Location[facility];  Service: Urology;  Laterality: N/A;  . Cystoscopy  01/23/2011    Procedure: CYSTOSCOPY;  Surgeon: Molli Hazard, MD;  Location: [facility];  Service: Urology;  Laterality: N/A;  . Flexible sigmoidoscopy  01/07/2012    Procedure: FLEXIBLE SIGMOIDOSCOPY;  Surgeon: Ladene Artist, MD,FACG;  Location: [facility];  Service: Endoscopy;  Laterality: N/A;  . Sigmoidectomy  2004    colovesical  fistula     Current Outpatient Prescriptions  Medication Sig Dispense Refill  . albuterol (PROVENTIL HFA;VENTOLIN HFA) 108 (90 BASE) MCG/ACT inhaler Inhale 2 puffs into the lungs every 6 (six) hours as needed for wheezing or shortness of breath. 1 Inhaler 2  . aspirin 81 MG tablet Take 1 tablet (81 mg total) by mouth every morning. Stop for 1 week and then resume    . atorvastatin (LIPITOR) 40 MG tablet Take 40 mg by mouth every morning.     . celecoxib (CELEBREX) 200 MG capsule Take 1 capsule (200 mg total) by mouth every evening. Hold for the next 5 days and then resume    . cholecalciferol (VITAMIN D) 1000 UNITS tablet Take 2,000 Units by mouth 2 (two) times daily.     . clonazePAM (KLONOPIN) 0.25 MG disintegrating tablet 1 tab po bid prn anxiety or agitation. 20 tablet 0  . enalapril (VASOTEC) 2.5 MG tablet Take 1 tablet (2.5 mg total) by mouth daily. 30 tablet 11  . Indacaterol Maleate (ARCAPTA NEOHALER) 75 MCG CAPS Place 1 capsule into inhaler and inhale every morning. 90 capsule 1  . metFORMIN (GLUCOPHAGE) 500 MG tablet Take 500 mg by mouth 3 (three) times daily.    . misoprostol (CYTOTEC) 100 MCG tablet Take 100-200 mcg by mouth 4 (four) times daily. Takes 1 tab in am, 1 tab in pm, 2 tabs at  bedtime    . Multiple Vitamins-Minerals (CENTRUM) tablet Take 1 tablet by mouth daily.      . pantoprazole (PROTONIX) 40 MG tablet Take 1 tablet (40 mg total) by mouth daily. 30 tablet 1  . PARoxetine (PAXIL) 20 MG tablet Take 20 mg by mouth every morning.      . vitamin C (ASCORBIC ACID) 500 MG tablet Take 500 mg by mouth daily.     No current facility-administered medications for this visit.    Allergies:   Review of patient's allergies indicates no known allergies.    Social History:  The patient  reports that he quit smoking about 36 years ago. His smoking use included Cigarettes. He has a 120 pack-year smoking history. He has never used smokeless tobacco. He reports that he does not drink  alcohol or use illicit drugs. Engineer retired.   Family History:  The patient's family history includes Cancer in his daughter; Emphysema in his brother; Heart disease in his mother. Mother had MI when she was 59.    ROS:  Please see the history of present illness.   Otherwise, review of systems are positive for none.   All other systems are reviewed and negative.    PHYSICAL EXAM: VS:  BP 90/40 mmHg  Pulse 95  Ht 6' (1.829 m)  Wt 240 lb 6.4 oz (109.045 kg)  BMI 32.60 kg/m2 , BMI Body mass index is 32.6 kg/(m^2). GEN: Well nourished, well developed, in no acute distress HEENT: normal Neck: no JVD, carotid bruits, or masses Cardiac: RRR; no murmurs, rubs, or gallops,no edema  Respiratory:  clear to auscultation bilaterally, normal work of breathing, distant breath sounds  GI: soft, nontender, nondistended, + BS MS: no deformity or atrophy Skin: warm and dry, no rash Neuro:  Strength and sensation are intact Psych: euthymic mood, full affect   EKG:  EKG is not ordered today. The ekg ordered today demon2/6/16-sinus tachycardia rate 106 with first-degree AV block, a point elevation in inferior leads. Nonspecific ST-T wave changes.   Recent Labs: 02/26/2014: TSH 1.706 02/27/2014: Magnesium 2.1 03/08/2014: ALT 32; BUN 20; Creatinine 0.99; Potassium 4.5; Sodium 139 03/15/2014: Hemoglobin 16.1; Platelets 321.0    Lipid Panel    Component Value Date/Time   CHOL  04/07/2008 1445    141        ATP III CLASSIFICATION:  <200     mg/dL   Desirable  200-239  mg/dL   Borderline High  >=240    mg/dL   High          TRIG 113 04/07/2008 1445   HDL 25* 04/07/2008 1445   CHOLHDL 5.6 04/07/2008 1445   VLDL 23 04/07/2008 1445   LDLCALC  04/07/2008 1445    93        Total Cholesterol/HDL:CHD Risk Coronary Heart Disease Risk Table                     Men   Women  1/2 Average Risk   3.4   3.3  Average Risk       5.0   4.4  2 X Average Risk   9.6   7.1  3 X Average Risk  23.4   11.0          Use the calculated Patient Ratio above and the CHD Risk Table to determine the patient's CHD Risk.        ATP III CLASSIFICATION (LDL):  <100     mg/dL  Optimal  100-129  mg/dL   Near or Above                    Optimal  130-159  mg/dL   Borderline  160-189  mg/dL   High  >190     mg/dL   Very High      Wt Readings from Last 3 Encounters:  05/03/14 240 lb 6.4 oz (109.045 kg)  03/30/14 242 lb 12.8 oz (110.133 kg)  03/29/14 238 lb 9.6 oz (108.228 kg)      Other studies Reviewed: Additional studies/ records that were reviewed today include:  prior medical records reviewed, lab work reviewed, EKG reviewed of the above records demonstrates: As above   ASSESSMENT AND PLAN:  1. Preoperative cardiovascular risk assessment - given his cardiac risk factors of diabetes, coronary artery disease equivalent, age, hyperlipidemia and COPD with home oxygen, I will go ahead and perform further risk stratification with pharmacologic stress test. After this test is performed, we will be able to further risk stratify him for upcoming laparoscopic cholecystectomy. Thankfully, he is not having any anginal like symptoms.   2. Mild hypotension - He has been on low-dose enalapril. At last visit his systolic blood pressure was 110. Currently it is 47. He is asymptomatic. If his blood pressure remains low, I would recommend discontinuation of enalapril. This is likely being utilized for prevention of diabetic nephropathy.  3. Hyperlipidemia - atorvastatin. Continue  4. COPD - 2L at baseline  5. Cholecystitis - awaiting cholecystectomy.   6. Former smoker  Meds are reviewed at length with the patient today.  The patient does not have concerns regarding medicines.  The following changes have been made:  no change  Labs/ tests ordered today include:   Orders Placed This Encounter  Procedures  . Myocardial Perfusion Imaging     Disposition:   Will review with patient results of NUC stress.     Bobby Rumpf, MD  05/03/2014 9:35 AM    [address], [place], [place]  [address] Phone: [phone]; Fax: [phone]

## 2014-05-11 ENCOUNTER — Ambulatory Visit (HOSPITAL_COMMUNITY): Payer: Medicare Other | Attending: Cardiology | Admitting: Radiology

## 2014-05-11 DIAGNOSIS — R0609 Other forms of dyspnea: Secondary | ICD-10-CM | POA: Insufficient documentation

## 2014-05-11 DIAGNOSIS — Z01818 Encounter for other preprocedural examination: Secondary | ICD-10-CM | POA: Diagnosis not present

## 2014-05-11 DIAGNOSIS — R9431 Abnormal electrocardiogram [ECG] [EKG]: Secondary | ICD-10-CM | POA: Insufficient documentation

## 2014-05-11 DIAGNOSIS — E119 Type 2 diabetes mellitus without complications: Secondary | ICD-10-CM | POA: Insufficient documentation

## 2014-05-11 DIAGNOSIS — I10 Essential (primary) hypertension: Secondary | ICD-10-CM

## 2014-05-11 MED ORDER — TECHNETIUM TC 99M SESTAMIBI GENERIC - CARDIOLITE
11.0000 | Freq: Once | INTRAVENOUS | Status: AC | PRN
  Administered 2014-05-11: 11 via INTRAVENOUS

## 2014-05-11 MED ORDER — TECHNETIUM TC 99M SESTAMIBI GENERIC - CARDIOLITE
30.0000 | Freq: Once | INTRAVENOUS | Status: AC | PRN
  Administered 2014-05-11: 30 via INTRAVENOUS

## 2014-05-11 MED ORDER — REGADENOSON 0.4 MG/5ML IV SOLN
0.4000 mg | Freq: Once | INTRAVENOUS | Status: AC
  Administered 2014-05-11: 0.4 mg via INTRAVENOUS

## 2014-05-11 NOTE — Progress Notes (Signed)
[facility] [address] [place], [place] [address] [phone]    Cardiology Nuclear Med Study  Lance Powell is a 81 y.o. male     MRN : [id number]     DOB: 1929/08/15  Procedure Date: 05/11/2014  Nuclear Med Background Indication for Stress Test:  Evaluation for Ischemia, Surgical Clearance-Gall Bladder Surgery and Abnormal EKG History:  COPD Cardiac Risk Factors: Hypertension and NIDDM  Symptoms:  DOE   Nuclear Pre-Procedure Caffeine/Decaff Intake:  None NPO After: 9:00pm   Lungs:  clear O2 Sat: 96% on room air. IV 0.9% NS with Angio Cath:  22g  IV Site: L Hand  IV Started by:  Crissie Figures, RN  Chest Size (in):  48 Cup Size: n/a  Height: 6' (1.829 m)  Weight:  244 lb (110.678 kg)  BMI:  Body mass index is 33.09 kg/(m^2). Tech Comments:  N/A    Nuclear Med Study 1 or 2 day study: 1 day  Stress Test Type:  Lexiscan  Reading MD: N/A  Order Authorizing Provider:  Candee Furbish, MD  Resting Radionuclide: Technetium 73m Sestamibi  Resting Radionuclide Dose: 11.0 mCi   Stress Radionuclide:  Technetium 64m Sestamibi  Stress Radionuclide Dose: 33.0 mCi           Stress Protocol Rest HR: 74 Stress HR: 95  Rest BP: 128/71 Stress BP: 118/66  Exercise Time (min): n/a METS: n/a   Predicted Max HR: 138 bpm % Max HR: 53.62 bpm Rate Pressure Product: 8954   Dose of Adenosine (mg):  n/a Dose of Lexiscan: 0.4 mg  Dose of Atropine (mg): n/a Dose of Dobutamine: n/a mcg/kg/min (at max HR)  Stress Test Technologist: Glade Lloyd, BS-ES  Nuclear Technologist:  Annye Rusk, CNMT     Rest Procedure:  Myocardial perfusion imaging was performed at rest 45 minutes following the intravenous administration of Technetium 56m Sestamibi. Rest ECG: NSR - Normal EKG  Stress Procedure:  The patient received IV Lexiscan 0.4 mg over 15-seconds.  Technetium 66m Sestamibi injected at 30-seconds.  Quantitative spect images were obtained after a 45 minute delay.   During the infusion of Lexiscan the patient complained of SOB and lightheadedness.  These symptoms resolved in recovery.  Stress ECG: No significant change from baseline ECG  QPS Raw Data Images:  Mild diaphragmatic attenuation.  Normal left ventricular size. Stress Images:  There is a large moderate intensity defect of the entire inferior and inferior lateral walls.   Rest Images:  There is a large moderate intensity defect of the entire inferior and inferior lateral walls. Subtraction (SDS):  No evidence of ischemia.  There is a fixed defect that appears to be most consistent with diaphragmatic attenuation.  Transient Ischemic Dilatation (Normal <1.22):  1.04 Lung/Heart Ratio (Normal <0.45):  0.35  Quantitative Gated Spect Images QGS EDV:  105 ml QGS ESV:  41 ml  Impression Exercise Capacity:  Lexiscan with no exercise. BP Response:  Normal blood pressure response. Clinical Symptoms:  No significant symptoms noted. ECG Impression:  No significant ST segment change suggestive of ischemia. Comparison with Prior Nuclear Study: No images to compare  Overall Impression:  Low risk stress nuclear study .  There is a large fixed defect in the inferior and lateral walls .  These walls contract normally suggesting that this defect is due to attenuation artifact.     .  I cannot rule out a previous MI   LV Ejection Fraction: 57%.  LV Wall  Motion:  NL LV Function; NL Wall Motion.   Thayer Headings, Brooke Bonito., MD, [facility] 05/11/2014, 4:22 PM [address]  [place] Pager [phone]

## 2014-05-16 ENCOUNTER — Telehealth: Payer: Self-pay | Admitting: Cardiology

## 2014-05-16 NOTE — Telephone Encounter (Signed)
Request for surgical clearance:  1. What type of surgery is being performed? Lap Chole  2. When is this surgery scheduled? Not scheduled yet pending cardiac clearacne  3. Are there any medications that need to be held prior to surgery and how long?Not they are aware of   4. Name of physician performing surgery? Dr. Redmond Pulling    5. What is your office phone and fax number?  Fax # [phone] Attn: Warren Lacy

## 2014-05-16 NOTE — Telephone Encounter (Signed)
Lance Powell w/Dr. Dois Davenport office at [facility] needs cardiac clearance for Lap Choley.  Has not been scheduled pending clearance.  Advised Dr. Marlou Porch not in office today but will forward message to him and his nurse Danella Maiers for recommendations.

## 2014-05-17 NOTE — Telephone Encounter (Signed)
Documentation of surgical clearance by Dr Marlou Porch on nuc study printed and taken to MR to be faxed.

## 2014-05-30 ENCOUNTER — Other Ambulatory Visit: Payer: Self-pay | Admitting: Medical

## 2014-05-30 DIAGNOSIS — E119 Type 2 diabetes mellitus without complications: Secondary | ICD-10-CM

## 2014-05-30 MED ORDER — METFORMIN HCL 500 MG PO TABS: 500.0000 mg | ORAL_TABLET | Freq: Three times a day (TID) | ORAL | Status: DC

## 2014-05-30 NOTE — Telephone Encounter (Signed)
Reminder on check cmp.

## 2014-05-30 NOTE — Telephone Encounter (Signed)
Refill his metformin but will get lpn to call pt and ask him to come in for repeat cmp and check his kidney function.

## 2014-05-31 NOTE — Telephone Encounter (Signed)
Called patient regarding CMP. Scheduled for 06/01/14. Patient states he will have Cholecystectomy on May 26th Advised refills sent to pharmacy.

## 2014-06-01 ENCOUNTER — Other Ambulatory Visit (INDEPENDENT_AMBULATORY_CARE_PROVIDER_SITE_OTHER): Payer: Medicare Other

## 2014-06-01 DIAGNOSIS — E119 Type 2 diabetes mellitus without complications: Secondary | ICD-10-CM

## 2014-06-02 LAB — COMPREHENSIVE METABOLIC PANEL
ALT: 21 U/L (ref 0–53)
AST: 18 U/L (ref 0–37)
Albumin: 3.8 g/dL (ref 3.5–5.2)
Alkaline Phosphatase: 66 U/L (ref 39–117)
BILIRUBIN TOTAL: 0.5 mg/dL (ref 0.2–1.2)
BUN: 17 mg/dL (ref 6–23)
CO2: 35 mEq/L — ABNORMAL HIGH (ref 19–32)
CREATININE: 1.07 mg/dL (ref 0.40–1.50)
Calcium: 10.4 mg/dL (ref 8.4–10.5)
Chloride: 103 mEq/L (ref 96–112)
GFR: 70.21 mL/min (ref >60.00)
GLUCOSE: 166 mg/dL — AB (ref 70–99)
POTASSIUM: 4.8 meq/L (ref 3.5–5.1)
Sodium: 141 mEq/L (ref 135–145)
Total Protein: 6.8 g/dL (ref 6.0–8.3)

## 2014-06-06 ENCOUNTER — Ambulatory Visit (INDEPENDENT_AMBULATORY_CARE_PROVIDER_SITE_OTHER): Payer: Medicare Other | Admitting: Pulmonary Disease

## 2014-06-06 ENCOUNTER — Encounter: Payer: Self-pay | Admitting: Pulmonary Disease

## 2014-06-06 VITALS — BP 110/58 | HR 114 | Temp 98.1°F | Ht 72.0 in | Wt 242.4 lb

## 2014-06-06 DIAGNOSIS — G4733 Obstructive sleep apnea (adult) (pediatric): Secondary | ICD-10-CM | POA: Diagnosis not present

## 2014-06-06 DIAGNOSIS — J438 Other emphysema: Secondary | ICD-10-CM | POA: Diagnosis not present

## 2014-06-06 NOTE — Assessment & Plan Note (Signed)
The patient continues on C Pap, and I have stressed to him the importance of doing so until he is able to lose significant weight.

## 2014-06-06 NOTE — Progress Notes (Signed)
Please put orders in Epic surgery 06-16-14 pre op 06-13-14 hanks

## 2014-06-06 NOTE — Patient Instructions (Signed)
Continue on cpap, and keep up with mask changes and supplies. Stay on oxygen Check your formulary to see if foradil or striverdi may be less expensive than arcapta. followup with Dr. Lamonte Sakai in 54mos if you are doing well.

## 2014-06-06 NOTE — Assessment & Plan Note (Signed)
The patient continues to be stable from a pulmonary standpoint on LABA alone. He has been tried on other medication combinations except for inhaled steroids, but he does not have exacerbations. He feels that he is maintaining on arcapta, but I have given him the names of other LABA to consider based on insurance. I have also stressed to him the importance of a conditioning program and weight reduction.

## 2014-06-06 NOTE — Progress Notes (Signed)
   Subjective:    Patient ID: Lance Powell, male    DOB: 1929/03/19, 81 y.o.   MRN: [id number]  HPI The patient comes in today for follow-up of his COPD and obstructive sleep apnea. He is staying on oxygen, as well as his bronchodilator regimen. He was tried on stiolto at the last visit, and he saw no significant change over arcapta alone. He has not had an acute exacerbation since last visit, and feels that his exertional tolerance is at baseline. He has known severe sleep apnea, and is asking about coming off C Pap. He has lost 25 pounds since diagnosis, but I told him this is not enough to be able to eliminate C Pap.   Review of Systems  Constitutional: Negative for fever and unexpected weight change.  HENT: Negative for congestion, dental problem, ear pain, nosebleeds, postnasal drip, rhinorrhea, sinus pressure, sneezing, sore throat and trouble swallowing.   Eyes: Negative for redness and itching.  Respiratory: Negative for cough, chest tightness, shortness of breath and wheezing.   Cardiovascular: Negative for palpitations and leg swelling.  Gastrointestinal: Negative for nausea and vomiting.  Genitourinary: Negative for dysuria.  Musculoskeletal: Negative for joint swelling.  Skin: Negative for rash.  Neurological: Negative for headaches.  Hematological: Does not bruise/bleed easily.  Psychiatric/Behavioral: Negative for dysphoric mood. The patient is not nervous/anxious.        Objective:   Physical Exam Overweight male in no acute distress Nose without purulence or discharge noted Neck without lymphadenopathy or thyromegaly Chest with decreased breath sounds, but otherwise clear Cardiac exam with regular rate and rhythm, 2/6 systolic murmur Lower extremities with no significant edema, no cyanosis Alert and oriented, moves all 4 extremities.       Assessment & Plan:

## 2014-06-07 ENCOUNTER — Ambulatory Visit: Payer: Self-pay | Admitting: General Surgery

## 2014-06-07 NOTE — H&P (Signed)
Lance Powell. O'Connell 03/30/2014 9:00 AM Location: [facility] Patient #: [id number] DOB: 01/04/1930 Married / Language: Cleophus Molt / Race: White Male  History of Present Illness [name] M. Milik Gilreath MD; 04/02/2014 12:51 PM) Patient words: recheck drain.  The patient is a 81 year old male who presents for evaluation of gallbladder disease. He comes in for hospital follow-up. I initially met him in the hospital in early February for acute cholecystitis with calculus as well as sigmoid diverticulitis. It appeared that he initially developed some sigmoid diverticulitis which was getting better and then developed right upper quadrant pain which prompted him to come to the emergency room. He did have evidence of sigmoid diverticulitis on his CT along with acute cholecystitis with calculus. He underwent percutaneous drainage of his gallbladder on February 7. He has numerous comorbidities including diabetes mellitus, COPD, obstructive sleep apnea on CPAP, hypertension. He was discharged on February 9. He states he is doing well. He denies any abdominal pain. He denies any fevers or chills. He denies any nausea or vomiting. His drain is putting out around 100 cc a day. He denies any lightheadedness or dizziness. He saw the pulmonologist on March 8. His daughter is staying with him temporarily. He has a son who is in medical school. His son also is a Conservator, museum/gallery. He was felt to be at moderate risk for postoperative pulmonary complications when seen by pulmonary yesterday   Other Problems [name], CMA; 03/30/2014 9:33 AM) Anxiety Disorder Arthritis Back Pain Bladder Problems Cholelithiasis Chronic Obstructive Lung Disease Diabetes Mellitus Diverticulosis Hemorrhoids High blood pressure Home Oxygen Use Hypercholesterolemia Inguinal Hernia Sleep Apnea  Past Surgical History [name], MD; 04/02/2014 1:00 PM) Colon Polyp Removal - Colonoscopy Colon  Polyp Removal - Open Tonsillectomy Vasectomy Nissen Fundoplication Colon Removal - Partial Sigmoid colectomy with low anterior anastomosis along with partial cystectomy 2004 Cystectomy; Partial2004 Inguinal Hernia Repair-Bilateral Appendectomy  Diagnostic Studies History [name], CMA; 03/30/2014 9:33 AM) Colonoscopy 1-5 years ago  Allergies [name], CMA; 03/30/2014 9:34 AM) No Known Drug Allergies03/09/2014  Medication History [name], CMA; 03/30/2014 9:35 AM) Zolpidem Tartrate (5MG Tablet, Oral) Active. Amoxicillin-Pot Clavulanate (875-125MG Tablet, Oral) Active. Atorvastatin Calcium (40MG Tablet, Oral) Active. Ciprofloxacin HCl (500MG Tablet, Oral) Active. Enalapril Maleate (2.5MG Tablet, Oral) Active. MetroNIDAZOLE (0.75% Cream, External) Active. MetroNIDAZOLE (500MG Tablet, Oral) Active. Misoprostol (100MCG Tablet, Oral) Active. PARoxetine HCl (20MG Tablet, Oral) Active. ProAir HFA (108 (90 Base)MCG/ACT Aerosol Soln, Inhalation) Active. Medications Reconciled  Social History [name], [place]; 03/30/2014 9:33 AM) Alcohol use Remotely quit alcohol use. Caffeine use Coffee, Tea. No drug use Tobacco use Former smoker.  Family History [name], [place]; 03/30/2014 9:33 AM) Cancer Sister. Heart Disease Mother. Kidney Disease Son. Melanoma Daughter. Migraine Headache Daughter. Respiratory Condition Daughter.  Review of Systems [name] CMA; 03/30/2014 9:33 AM) General Present- Fatigue and Weight Loss. Not Present- Appetite Loss, Chills, Fever, Night Sweats and Weight Gain. Skin Not Present- Change in Wart/Mole, Dryness, Hives, Jaundice, New Lesions, Non-Healing Wounds, Rash and Ulcer. HEENT Present- Hearing Loss and Wears glasses/contact lenses. Not Present- Earache, Hoarseness, Nose Bleed, Oral Ulcers, Ringing in the Ears, Seasonal Allergies, Sinus Pain, Sore Throat, Visual Disturbances and Yellow Eyes. Breast Not Present- Breast  Mass, Breast Pain, Nipple Discharge and Skin Changes. Cardiovascular Not Present- Chest Pain, Difficulty Breathing Lying Down, Leg Cramps, Palpitations, Rapid Heart Rate, Shortness of Breath and Swelling of Extremities. Male Genitourinary Present- Impotence. Not Present- Blood in Urine, Change in Urinary Stream, Frequency, Nocturia,  Painful Urination, Urgency and Urine Leakage.   Vitals [name] CMA; 03/30/2014 9:35 AM) 03/30/2014 9:35 AM Weight: 243 lb Height: 72in Body Surface Area: 2.37 m Body Mass Index: 32.96 kg/m Temp.: 96.5F(Temporal)  Pulse: 108 (Regular)  BP: 132/68 (Sitting, Left Arm, Standard)    Physical Exam [name] M. Basha Krygier MD; 04/02/2014 12:48 PM) General Mental Status-Alert. General Appearance-Consistent with stated age. Hydration-Well hydrated. Voice-Normal. Note: Obese   Head and Neck Head-normocephalic, atraumatic with no lesions or palpable masses. Trachea-midline. Thyroid Gland Characteristics - normal size and consistency.  Eye Eyeball - Bilateral-Extraocular movements intact. Sclera/Conjunctiva - Bilateral-No scleral icterus.  Chest and Lung Exam Chest and lung exam reveals -quiet, even and easy respiratory effort with no use of accessory muscles and on auscultation, normal breath sounds, no adventitious sounds and normal vocal resonance. Inspection Chest Wall - Normal. Back - normal.  Breast - Did not examine.  Cardiovascular Cardiovascular examination reveals -normal heart sounds, regular rate and rhythm with no murmurs and normal pedal pulses bilaterally.  Abdomen Inspection  Inspection of the abdomen reveals: Note: Ventral incisional hernia, reducible, periumbilical area; gallbladder drain lateral right upper quadrant?bilious drainage in bag, drain site okay. Skin - Scar - Note: Long midline incision, bilateral groin incisions, bright lower abdominal paramedian incision. Palpable fascial defect around the  level of the umbilicus, reducible hernia. Nontender. Palpation/Percussion Palpation and Percussion of the abdomen reveal - Soft, Non Tender, No Rebound tenderness, No Rigidity (guarding) and No hepatosplenomegaly. Auscultation Auscultation of the abdomen reveals - Bowel sounds normal.  Peripheral Vascular Upper Extremity Palpation - Pulses bilaterally normal.  Neurologic Neurologic evaluation reveals -alert and oriented x 3 with no impairment of recent or remote memory. Mental Status-Normal.  Neuropsychiatric The patient's mood and affect are described as -normal. Judgment and Insight-insight is appropriate concerning matters relevant to self.  Musculoskeletal Normal Exam - Left-Upper Extremity Strength Normal and Lower Extremity Strength Normal. Normal Exam - Right-Upper Extremity Strength Normal and Lower Extremity Strength Normal.  Lymphatic Head & Neck  General Head & Neck Lymphatics: Bilateral - Description - Normal. Axillary - Did not examine. Femoral & Inguinal - Did not examine.    Assessment & Plan [name] M. Rosanna Bickle MD; 04/02/2014 12:58 PM) CHRONIC CHOLECYSTITIS WITH CALCULUS (574.10  K80.10) Impression: He appears to be doing well with respect to his cholecystitis. He clinically appears well. He does have a large gallstone. We first need to get a cholangiogram through his gallbladder drain to evaluate his bile ducts. Even if his biliary tree is patent and his gallbladder drains normally I think it he is at significant risk for recurrent cholecystitis given his large gallstone. Therefore I think we will ultimately need to remove his gallbladder. I explained to him and his daughter that we would need cardiac clearance. Once we have obtained cardiac clearance will be scheduled for surgery. We will get a gallbladder drain study and cardiac clearance and then schedule him for surgery. We discussed the risk and benefits of surgery. Please see discussion below. I did  advise him and his daughter that he was at increased risk for heart and pulmonary complications. We also discussed that he is at increased risk for conversion to an open procedure given his extensive prior abdominal surgery. I did not recommend fixing his incisional hernia at the same time as cholecystectomy since the gallbladder surgery is considered a contaminated procedure. However we did discuss that if for some reason we needed to fix it at the time of gallbladder surgery that it  with either be done primarily or with biologic mesh and that his risk of recurrence would be elevated Current Plans  Provided with written educational materials Instructions: We will arrange a cholangiogram through your current gallbladder drain to evaluate your bile ducts. We will also arrange cardiac consultation for clearance for surgery Once we obtain cardiology clearance our office will contact you to schedule gallbladder surgery-laparoscopic cholecystectomy with possible open cholecystectomy  I believe the patient's symptoms are consistent with gallbladder disease.  We discussed gallbladder disease. The patient was given Neurosurgeon. We discussed non-operative and operative management. We discussed the signs & symptoms of acute cholecystitis  I discussed laparoscopic cholecystectomy with IOC & open in detail. The patient was given educational material as well as diagrams detailing the procedure. We discussed the risks and benefits of a laparoscopic cholecystectomy including, but not limited to bleeding, infection, injury to surrounding structures such as the intestine or liver, bile leak, retained gallstones, need to convert to an open procedure, prolonged diarrhea, blood clots such as DVT, common bile duct injury, anesthesia risks, and possible need for additional procedures. We discussed the typical post-operative recovery course. I explained that the likelihood of improvement of their symptoms is  good.  The patient has elected to proceed with surgery once we get cardiac clearance CHOLECYSTOSTOMY CARE (V55.4  Z43.4) Impression: Continue current care, gallbladder drain to gravity. We'll set him up for gallbladder drain study CHRONIC OBSTRUCTIVE PULMONARY DISEASE, UNSPECIFIED COPD TYPE (496  J44.9) Impression: Appears to be stable with respect to his COPD. Just saw the pulmonologist yesterday. Appears to be maximized as best as possible ESSENTIAL HYPERTENSION (401.9  I10) SLEEP APNEA IN ADULT (327.23  G47.33) Impression: Discussed the importance of him wearing his CPAP CONTROLLED TYPE 2 DIABETES MELLITUS (250.00  E11.9) DIVERTICULITIS, COLON (562.11  K57.32) Impression: Appears to have resolved. 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 It is reducible and nontender. I did not recommend repair at the same time as this cholecystectomy since his gallbladder would be considered "dirty ". I am hopeful that we can do a cholecystectomy without having to deal with his hernia. I did advise him there is a small possibility that we may have to deal with it at the time of surgery. We discussed that it would be a primary muscle repair or biologic mesh and that the repair would be at increased risk for failure  [name]. Redmond Pulling, MD, FACS General, Bariatric, & Minimally Invasive Surgery [facility], [place]

## 2014-06-08 ENCOUNTER — Inpatient Hospital Stay (HOSPITAL_COMMUNITY): Admission: RE | Admit: 2014-06-08 | Payer: Medicare Other | Source: Ambulatory Visit

## 2014-06-13 ENCOUNTER — Encounter (HOSPITAL_COMMUNITY): Payer: Self-pay

## 2014-06-13 ENCOUNTER — Encounter (HOSPITAL_COMMUNITY)
Admission: RE | Admit: 2014-06-13 | Discharge: 2014-06-13 | Disposition: A | Discharge status: Home / Self Care | Payer: Medicare Other | Source: Ambulatory Visit | Attending: General Surgery | Admitting: General Surgery

## 2014-06-13 DIAGNOSIS — E119 Type 2 diabetes mellitus without complications: Secondary | ICD-10-CM | POA: Diagnosis not present

## 2014-06-13 DIAGNOSIS — K811 Chronic cholecystitis: Secondary | ICD-10-CM | POA: Insufficient documentation

## 2014-06-13 DIAGNOSIS — K5730 Diverticulosis of large intestine without perforation or abscess without bleeding: Secondary | ICD-10-CM | POA: Diagnosis not present

## 2014-06-13 DIAGNOSIS — Z86010 Personal history of colonic polyps: Secondary | ICD-10-CM | POA: Diagnosis not present

## 2014-06-13 DIAGNOSIS — Z01812 Encounter for preprocedural laboratory examination: Secondary | ICD-10-CM | POA: Insufficient documentation

## 2014-06-13 DIAGNOSIS — J449 Chronic obstructive pulmonary disease, unspecified: Secondary | ICD-10-CM | POA: Diagnosis not present

## 2014-06-13 DIAGNOSIS — F419 Anxiety disorder, unspecified: Secondary | ICD-10-CM | POA: Diagnosis not present

## 2014-06-13 DIAGNOSIS — Z9049 Acquired absence of other specified parts of digestive tract: Secondary | ICD-10-CM | POA: Diagnosis not present

## 2014-06-13 DIAGNOSIS — Z87891 Personal history of nicotine dependence: Secondary | ICD-10-CM | POA: Diagnosis not present

## 2014-06-13 DIAGNOSIS — E780 Pure hypercholesterolemia: Secondary | ICD-10-CM | POA: Diagnosis not present

## 2014-06-13 DIAGNOSIS — Z9981 Dependence on supplemental oxygen: Secondary | ICD-10-CM | POA: Diagnosis not present

## 2014-06-13 DIAGNOSIS — G4730 Sleep apnea, unspecified: Secondary | ICD-10-CM | POA: Diagnosis not present

## 2014-06-13 DIAGNOSIS — Z9852 Vasectomy status: Secondary | ICD-10-CM | POA: Diagnosis not present

## 2014-06-13 DIAGNOSIS — M1990 Unspecified osteoarthritis, unspecified site: Secondary | ICD-10-CM | POA: Diagnosis not present

## 2014-06-13 DIAGNOSIS — K660 Peritoneal adhesions (postprocedural) (postinfection): Secondary | ICD-10-CM | POA: Diagnosis not present

## 2014-06-13 DIAGNOSIS — K8010 Calculus of gallbladder with chronic cholecystitis without obstruction: Secondary | ICD-10-CM | POA: Diagnosis not present

## 2014-06-13 DIAGNOSIS — I10 Essential (primary) hypertension: Secondary | ICD-10-CM | POA: Diagnosis not present

## 2014-06-13 HISTORY — DX: Personal history of other medical treatment: Z92.89

## 2014-06-13 LAB — CBC WITH DIFFERENTIAL/PLATELET
Basophils Absolute: 0 10*3/uL (ref 0.0–0.1)
Basophils Relative: 1 % (ref 0–1)
Eosinophils Absolute: 0.2 10*3/uL (ref 0.0–0.7)
Eosinophils Relative: 3 % (ref 0–5)
HCT: 48.1 % (ref 39.0–52.0)
Hemoglobin: 15.2 g/dL (ref 13.0–17.0)
LYMPHS PCT: 35 % (ref 12–46)
Lymphs Abs: 2.8 10*3/uL (ref 0.7–4.0)
MCH: 30.7 pg (ref 26.0–34.0)
MCHC: 31.6 g/dL (ref 30.0–36.0)
MCV: 97.2 fL (ref 78.0–100.0)
Monocytes Absolute: 0.7 10*3/uL (ref 0.1–1.0)
Monocytes Relative: 9 % (ref 3–12)
NEUTROS ABS: 4.3 10*3/uL (ref 1.7–7.7)
NEUTROS PCT: 52 % (ref 43–77)
Platelets: 201 10*3/uL (ref 150–400)
RBC: 4.95 MIL/uL (ref 4.22–5.81)
RDW: 15 % (ref 11.5–15.5)
WBC: 8.1 10*3/uL (ref 4.0–10.5)

## 2014-06-13 LAB — COMPREHENSIVE METABOLIC PANEL
ALBUMIN: 4.2 g/dL (ref 3.5–5.0)
ALK PHOS: 83 U/L (ref 38–126)
ALT: 23 U/L (ref 17–63)
ANION GAP: 7 (ref 5–15)
AST: 21 U/L (ref 15–41)
BUN: 16 mg/dL (ref 6–20)
CALCIUM: 10.7 mg/dL — AB (ref 8.9–10.3)
CHLORIDE: 105 mmol/L (ref 101–111)
CO2: 31 mmol/L (ref 22–32)
Creatinine, Ser: 1.09 mg/dL (ref 0.61–1.24)
GFR calc Af Amer: >60 mL/min (ref >60)
Glucose, Bld: 127 mg/dL — ABNORMAL HIGH (ref 65–99)
Potassium: 5.1 mmol/L (ref 3.5–5.1)
SODIUM: 143 mmol/L (ref 135–145)
Total Bilirubin: 0.7 mg/dL (ref 0.3–1.2)
Total Protein: 7.6 g/dL (ref 6.5–8.1)

## 2014-06-13 NOTE — Patient Instructions (Addendum)
Your procedure is scheduled on:  06/16/14  THURSDAY  Report to [place] at  1000     AM.   Call this number if you have problems the morning of surgery: [phone]     DO NOT TAKE ANY DIABETIC MEDICATION MORNING OF SURGERY   Do not eat food  Or drink :After Midnight. Wednesday NIGHT   Take these medicines the morning of surgery with A SIP OF WATER:PAXIL       MAY TAKE CLONAZEPAM if needed USE ARCAPTANE OHALER BEFORE YOU COME TO HOSPITAL BRING INHALERS AND CPAP MASK AND TUBING WITH YOU TO HOSPITAL----BRING INHALERS WITH YOU TO SHORT STAY  .  Contacts, dentures or partial plates, or metal hairpins  can not be worn to surgery. Your family will be responsible for glasses, dentures, hearing aides while you are in surgery  Leave suitcase in the car. After surgery it may be brought to your room.  For patients admitted to the hospital, checkout time is 11:00 AM day of  discharge.         [facility] IS NOT RESPONSIBLE FOR ANY VALUABLES  Patients discharged the day of surgery will not be allowed to drive home. IF going home the day of surgery, you must have a driver and someone to stay with you for the first 24 hours                                                                 [facility] - Preparing for Surgery Before surgery, you can play an important role.  Because skin is not sterile, your skin needs to be as free of germs as possible.  You can reduce the number of germs on your skin by washing with CHG (chlorahexidine gluconate) soap before surgery.  CHG is an antiseptic cleaner which kills germs and bonds with the skin to continue killing germs even after washing. Please DO NOT use if you have an allergy to CHG or antibacterial soaps.  If your skin becomes reddened/irritated stop using the CHG and inform your nurse when you arrive at Short Stay. Do not shave (including legs and underarms) for at least 48  hours prior to the first CHG shower.  You may shave your face/neck. Please follow these instructions carefully:  1.  Shower with CHG Soap the night before surgery and the  morning of Surgery.  2.  If you choose to wash your hair, wash your hair first as usual with your  normal  shampoo.  3.  After you shampoo, rinse your hair and body thoroughly to remove the  shampoo.                           4.  Use CHG as you would any other liquid soap.  You can apply chg directly  to the skin and wash                       Gently with a scrungie or clean washcloth.  5.  Apply the CHG Soap to your body ONLY FROM THE NECK DOWN.   Do not use on  face/ open                           Wound or open sores. Avoid contact with eyes, ears mouth and genitals (private parts).                       Wash face,  Genitals (private parts) with your normal soap.             6.  Wash thoroughly, paying special attention to the area where your surgery  will be performed.  7.  Thoroughly rinse your body with warm water from the neck down.  8.  DO NOT shower/wash with your normal soap after using and rinsing off  the CHG Soap.                9.  Pat yourself dry with a clean towel.            10.  Wear clean pajamas.            11.  Place clean sheets on your bed the night of your first shower and do not  sleep with pets. Day of Surgery : Do not apply any lotions/deodorants the morning of surgery.  Please wear clean clothes to the hospital/surgery center.  FAILURE TO FOLLOW THESE INSTRUCTIONS MAY RESULT IN THE CANCELLATION OF YOUR SURGERY PATIENT SIGNATURE_________________________________  NURSE SIGNATURE__________________________________  ________________________________________________________________________

## 2014-06-13 NOTE — Progress Notes (Signed)
ekg 2/16 Chest x ray 2/16 Stress test 4/16 Clearance dr Marlou Porch 05/17/14 note in epic lov dr clance  5/16  ALL IN EPIC

## 2014-06-13 NOTE — Progress Notes (Signed)
PST NOTE-- INSTRUCTED PATIENT TO GO HOME AND REVIEW INSTRUCTIONS FROM SURGEONS OFFICE REGARDING IF ANY MEDS NEED TO BE STOPPED PRE OP

## 2014-06-16 ENCOUNTER — Encounter (HOSPITAL_COMMUNITY)
Admission: RE | Disposition: A | Discharge status: Home / Self Care | Payer: Self-pay | Source: Ambulatory Visit | Attending: General Surgery

## 2014-06-16 ENCOUNTER — Ambulatory Visit (HOSPITAL_COMMUNITY): Payer: Medicare Other | Admitting: Anesthesiology

## 2014-06-16 ENCOUNTER — Observation Stay (HOSPITAL_COMMUNITY)
Admission: RE | Admit: 2014-06-16 | Discharge: 2014-06-19 | Disposition: A | Discharge status: Home / Self Care | Payer: Medicare Other | Source: Ambulatory Visit | Attending: General Surgery | Admitting: General Surgery

## 2014-06-16 ENCOUNTER — Encounter (HOSPITAL_COMMUNITY): Payer: Self-pay | Admitting: *Deleted

## 2014-06-16 DIAGNOSIS — Z9852 Vasectomy status: Secondary | ICD-10-CM | POA: Insufficient documentation

## 2014-06-16 DIAGNOSIS — I10 Essential (primary) hypertension: Secondary | ICD-10-CM | POA: Insufficient documentation

## 2014-06-16 DIAGNOSIS — Z9981 Dependence on supplemental oxygen: Secondary | ICD-10-CM | POA: Insufficient documentation

## 2014-06-16 DIAGNOSIS — K8010 Calculus of gallbladder with chronic cholecystitis without obstruction: Principal | ICD-10-CM | POA: Insufficient documentation

## 2014-06-16 DIAGNOSIS — G4730 Sleep apnea, unspecified: Secondary | ICD-10-CM | POA: Insufficient documentation

## 2014-06-16 DIAGNOSIS — K660 Peritoneal adhesions (postprocedural) (postinfection): Secondary | ICD-10-CM | POA: Diagnosis not present

## 2014-06-16 DIAGNOSIS — E119 Type 2 diabetes mellitus without complications: Secondary | ICD-10-CM

## 2014-06-16 DIAGNOSIS — K811 Chronic cholecystitis: Secondary | ICD-10-CM | POA: Diagnosis present

## 2014-06-16 DIAGNOSIS — E780 Pure hypercholesterolemia: Secondary | ICD-10-CM | POA: Insufficient documentation

## 2014-06-16 DIAGNOSIS — M1990 Unspecified osteoarthritis, unspecified site: Secondary | ICD-10-CM | POA: Diagnosis not present

## 2014-06-16 DIAGNOSIS — Z87891 Personal history of nicotine dependence: Secondary | ICD-10-CM | POA: Insufficient documentation

## 2014-06-16 DIAGNOSIS — Z86010 Personal history of colonic polyps: Secondary | ICD-10-CM | POA: Insufficient documentation

## 2014-06-16 DIAGNOSIS — F419 Anxiety disorder, unspecified: Secondary | ICD-10-CM | POA: Insufficient documentation

## 2014-06-16 DIAGNOSIS — J449 Chronic obstructive pulmonary disease, unspecified: Secondary | ICD-10-CM | POA: Diagnosis present

## 2014-06-16 DIAGNOSIS — Z9049 Acquired absence of other specified parts of digestive tract: Secondary | ICD-10-CM | POA: Insufficient documentation

## 2014-06-16 DIAGNOSIS — K5730 Diverticulosis of large intestine without perforation or abscess without bleeding: Secondary | ICD-10-CM | POA: Insufficient documentation

## 2014-06-16 DIAGNOSIS — G4733 Obstructive sleep apnea (adult) (pediatric): Secondary | ICD-10-CM | POA: Diagnosis present

## 2014-06-16 HISTORY — PX: CHOLECYSTECTOMY: SHX55

## 2014-06-16 LAB — GLUCOSE, CAPILLARY
GLUCOSE-CAPILLARY: 103 mg/dL — AB (ref 65–99)
GLUCOSE-CAPILLARY: 97 mg/dL (ref 65–99)
Glucose-Capillary: 156 mg/dL — ABNORMAL HIGH (ref 65–99)
Glucose-Capillary: 120 mg/dL — ABNORMAL HIGH (ref 65–99)

## 2014-06-16 SURGERY — LAPAROSCOPIC CHOLECYSTECTOMY
Anesthesia: General | Site: Abdomen

## 2014-06-16 MED ORDER — ONDANSETRON HCL 4 MG/2ML IJ SOLN: 4.0000 mg | Freq: Four times a day (QID) | INTRAMUSCULAR | Status: DC | PRN

## 2014-06-16 MED ORDER — FENTANYL CITRATE (PF) 100 MCG/2ML IJ SOLN
INTRAMUSCULAR | Status: AC
  Filled 2014-06-16: qty 2

## 2014-06-16 MED ORDER — TIOTROPIUM BROMIDE-OLODATEROL 2.5-2.5 MCG/ACT IN AERS: 1.0000 | INHALATION_SPRAY | Freq: Every day | RESPIRATORY_TRACT | Status: DC

## 2014-06-16 MED ORDER — ONDANSETRON HCL 4 MG/2ML IJ SOLN
INTRAMUSCULAR | Status: AC
  Filled 2014-06-16: qty 2

## 2014-06-16 MED ORDER — ETOMIDATE 2 MG/ML IV SOLN
INTRAVENOUS | Status: DC | PRN
  Administered 2014-06-16: 10 mg via INTRAVENOUS

## 2014-06-16 MED ORDER — PROPOFOL 10 MG/ML IV BOLUS
INTRAVENOUS | Status: AC
  Filled 2014-06-16: qty 20

## 2014-06-16 MED ORDER — MORPHINE SULFATE 2 MG/ML IJ SOLN
1.0000 mg | INTRAMUSCULAR | Status: DC | PRN
  Administered 2014-06-16 – 2014-06-17 (×3): 2 mg via INTRAVENOUS
  Filled 2014-06-16 (×3): qty 1

## 2014-06-16 MED ORDER — GLYCOPYRROLATE 0.2 MG/ML IJ SOLN
INTRAMUSCULAR | Status: AC
  Filled 2014-06-16: qty 4

## 2014-06-16 MED ORDER — LACTATED RINGERS IV SOLN
INTRAVENOUS | Status: DC | PRN
  Administered 2014-06-16: 1000 mL via INTRAVENOUS

## 2014-06-16 MED ORDER — SUCCINYLCHOLINE CHLORIDE 20 MG/ML IJ SOLN
INTRAMUSCULAR | Status: DC | PRN
  Administered 2014-06-16: 100 mg via INTRAVENOUS

## 2014-06-16 MED ORDER — ONDANSETRON HCL 4 MG PO TABS: 4.0000 mg | ORAL_TABLET | Freq: Four times a day (QID) | ORAL | Status: DC | PRN

## 2014-06-16 MED ORDER — ALBUTEROL SULFATE (2.5 MG/3ML) 0.083% IN NEBU: 3.0000 mL | INHALATION_SOLUTION | Freq: Four times a day (QID) | RESPIRATORY_TRACT | Status: DC | PRN

## 2014-06-16 MED ORDER — NEOSTIGMINE METHYLSULFATE 10 MG/10ML IV SOLN
INTRAVENOUS | Status: DC | PRN
  Administered 2014-06-16: 5 mg via INTRAVENOUS

## 2014-06-16 MED ORDER — ONDANSETRON HCL 4 MG/2ML IJ SOLN
INTRAMUSCULAR | Status: DC | PRN
  Administered 2014-06-16: 4 mg via INTRAVENOUS

## 2014-06-16 MED ORDER — CISATRACURIUM BESYLATE (PF) 10 MG/5ML IV SOLN
INTRAVENOUS | Status: DC | PRN
  Administered 2014-06-16: 7 mg via INTRAVENOUS
  Administered 2014-06-16 (×2): 2 mg via INTRAVENOUS

## 2014-06-16 MED ORDER — PAROXETINE HCL 20 MG PO TABS
20.0000 mg | ORAL_TABLET | Freq: Every day | ORAL | Status: DC
  Administered 2014-06-17 – 2014-06-19 (×3): 20 mg via ORAL
  Filled 2014-06-16 (×4): qty 1

## 2014-06-16 MED ORDER — GLYCOPYRROLATE 0.2 MG/ML IJ SOLN
INTRAMUSCULAR | Status: DC | PRN
  Administered 2014-06-16: .8 mg via INTRAVENOUS

## 2014-06-16 MED ORDER — POTASSIUM CHLORIDE IN NACL 20-0.45 MEQ/L-% IV SOLN
INTRAVENOUS | Status: DC
  Administered 2014-06-16 – 2014-06-18 (×3): via INTRAVENOUS
  Filled 2014-06-16 (×7): qty 1000

## 2014-06-16 MED ORDER — INSULIN ASPART 100 UNIT/ML ~~LOC~~ SOLN
0.0000 [IU] | Freq: Three times a day (TID) | SUBCUTANEOUS | Status: DC
  Administered 2014-06-17 – 2014-06-19 (×5): 2 [IU] via SUBCUTANEOUS

## 2014-06-16 MED ORDER — PANTOPRAZOLE SODIUM 40 MG IV SOLR
40.0000 mg | INTRAVENOUS | Status: DC
  Administered 2014-06-16 – 2014-06-18 (×3): 40 mg via INTRAVENOUS
  Filled 2014-06-16 (×4): qty 40

## 2014-06-16 MED ORDER — BUPIVACAINE-EPINEPHRINE 0.25% -1:200000 IJ SOLN
INTRAMUSCULAR | Status: DC | PRN
  Administered 2014-06-16: 15 mL

## 2014-06-16 MED ORDER — METHOCARBAMOL 1000 MG/10ML IJ SOLN
500.0000 mg | Freq: Three times a day (TID) | INTRAVENOUS | Status: AC
  Administered 2014-06-16 – 2014-06-18 (×6): 500 mg via INTRAVENOUS
  Filled 2014-06-16 (×7): qty 5

## 2014-06-16 MED ORDER — HYDROMORPHONE HCL 1 MG/ML IJ SOLN: 0.2500 mg | INTRAMUSCULAR | Status: DC | PRN

## 2014-06-16 MED ORDER — FENTANYL CITRATE (PF) 100 MCG/2ML IJ SOLN
INTRAMUSCULAR | Status: DC | PRN
  Administered 2014-06-16: 100 ug via INTRAVENOUS
  Administered 2014-06-16 (×2): 50 ug via INTRAVENOUS

## 2014-06-16 MED ORDER — PROPOFOL 10 MG/ML IV BOLUS
INTRAVENOUS | Status: DC | PRN
  Administered 2014-06-16: 100 mg via INTRAVENOUS

## 2014-06-16 MED ORDER — HEPARIN SODIUM (PORCINE) 5000 UNIT/ML IJ SOLN
5000.0000 [IU] | Freq: Three times a day (TID) | INTRAMUSCULAR | Status: DC
  Administered 2014-06-17 – 2014-06-19 (×6): 5000 [IU] via SUBCUTANEOUS
  Filled 2014-06-16 (×9): qty 1

## 2014-06-16 MED ORDER — LACTATED RINGERS IV SOLN
INTRAVENOUS | Status: DC
  Administered 2014-06-16: 1000 mL via INTRAVENOUS
  Administered 2014-06-16: 12:00:00 via INTRAVENOUS

## 2014-06-16 MED ORDER — DEXTROSE 5 % IV SOLN
2.0000 g | INTRAVENOUS | Status: AC
  Administered 2014-06-16: 2 g via INTRAVENOUS

## 2014-06-16 MED ORDER — OXYCODONE-ACETAMINOPHEN 5-325 MG PO TABS
1.0000 | ORAL_TABLET | ORAL | Status: DC | PRN
  Administered 2014-06-16 – 2014-06-18 (×7): 1 via ORAL
  Administered 2014-06-19: 2 via ORAL
  Filled 2014-06-16 (×6): qty 1
  Filled 2014-06-16: qty 2
  Filled 2014-06-16: qty 1

## 2014-06-16 MED ORDER — DEXTROSE 5 % IV SOLN
INTRAVENOUS | Status: AC
  Filled 2014-06-16: qty 2

## 2014-06-16 SURGICAL SUPPLY — 36 items
APPLIER CLIP 5 13 M/L LIGAMAX5 (MISCELLANEOUS) ×3
APPLIER CLIP ROT 10 11.4 M/L (STAPLE) ×3
CHLORAPREP W/TINT 26ML (MISCELLANEOUS) ×3 IMPLANT
CLIP APPLIE 5 13 M/L LIGAMAX5 (MISCELLANEOUS) ×2 IMPLANT
CLIP APPLIE ROT 10 11.4 M/L (STAPLE) ×2 IMPLANT
COVER MAYO STAND STRL (DRAPES) IMPLANT
DECANTER SPIKE VIAL GLASS SM (MISCELLANEOUS) ×3 IMPLANT
DERMABOND ADVANCED (GAUZE/BANDAGES/DRESSINGS) ×1
DERMABOND ADVANCED .7 DNX12 (GAUZE/BANDAGES/DRESSINGS) ×2 IMPLANT
DEVICE TROCAR PUNCTURE CLOSURE (ENDOMECHANICALS) ×3 IMPLANT
DRAPE C-ARM 42X120 X-RAY (DRAPES) IMPLANT
DRAPE LAPAROSCOPIC ABDOMINAL (DRAPES) ×3 IMPLANT
ELECT REM PT RETURN 9FT ADLT (ELECTROSURGICAL) ×3
ELECTRODE REM PT RTRN 9FT ADLT (ELECTROSURGICAL) ×2 IMPLANT
ENDOLOOP SUT PDS II  0 18 (SUTURE) ×1
ENDOLOOP SUT PDS II 0 18 (SUTURE) ×2 IMPLANT
GLOVE BIO SURGEON STRL SZ7.5 (GLOVE) ×3 IMPLANT
GLOVE BIOGEL M STRL SZ7.5 (GLOVE) ×3 IMPLANT
GLOVE BIOGEL PI IND STRL 7.0 (GLOVE) ×2 IMPLANT
GLOVE BIOGEL PI INDICATOR 7.0 (GLOVE) ×1
GOWN STRL REUS W/TWL XL LVL3 (GOWN DISPOSABLE) ×9 IMPLANT
KIT BASIN OR (CUSTOM PROCEDURE TRAY) ×3 IMPLANT
LIQUID BAND (GAUZE/BANDAGES/DRESSINGS) ×3 IMPLANT
NS IRRIG 1000ML POUR BTL (IV SOLUTION) ×3 IMPLANT
POUCH SPECIMEN RETRIEVAL 10MM (ENDOMECHANICALS) ×3 IMPLANT
SET CHOLANGIOGRAPH MIX (MISCELLANEOUS) IMPLANT
SET IRRIG TUBING LAPAROSCOPIC (IRRIGATION / IRRIGATOR) ×3 IMPLANT
SLEEVE XCEL OPT CAN 5 100 (ENDOMECHANICALS) ×9 IMPLANT
SUT MNCRL AB 4-0 PS2 18 (SUTURE) ×3 IMPLANT
SUT VICRYL 0 UR6 27IN ABS (SUTURE) ×3 IMPLANT
TOWEL OR 17X26 10 PK STRL BLUE (TOWEL DISPOSABLE) ×3 IMPLANT
TOWEL OR NON WOVEN STRL DISP B (DISPOSABLE) ×3 IMPLANT
TRAY LAPAROSCOPIC (CUSTOM PROCEDURE TRAY) ×3 IMPLANT
TROCAR BLADELESS OPT 5 100 (ENDOMECHANICALS) ×3 IMPLANT
TROCAR XCEL BLUNT TIP 100MML (ENDOMECHANICALS) ×3 IMPLANT
TROCAR XCEL NON-BLD 11X100MML (ENDOMECHANICALS) ×3 IMPLANT

## 2014-06-16 NOTE — Interval H&P Note (Signed)
History and Physical Interval Note:  06/16/2014 11:21 AM  Lance Powell  has presented today for surgery, with the diagnosis of Chronic Cholecystitis  The various methods of treatment have been discussed with the patient and family. After consideration of risks, benefits and other options for treatment, the patient has consented to  Procedure(s): LAPAROSCOPIC CHOLECYSTECTOMY (N/A) POSSIBLE OPEN CHOLECYSTECTOMY (N/A) as a surgical intervention .  The patient's history has been reviewed, patient examined, no change in status, stable for surgery.  I have reviewed the patient's chart and labs.  Questions were answered to the patient's satisfaction.    [name]. Redmond Pulling, MD, [place], Bariatric, & Minimally Invasive Surgery [facility], [place]    [facility] M

## 2014-06-16 NOTE — Anesthesia Postprocedure Evaluation (Signed)
  Anesthesia Post-op Note  Patient: Lance Powell  Procedure(s) Performed: Procedure(s): LAPAROSCOPIC CHOLECYSTECTOMY (N/A) POSSIBLE OPEN CHOLECYSTECTOMY (N/A)  Patient Location: PACU  Anesthesia Type:General  Level of Consciousness: awake and alert   Airway and Oxygen Therapy: Patient Spontanous Breathing  Post-op Pain: none  Post-op Assessment: Post-op Vital signs reviewed, Patient's Cardiovascular Status Stable and Respiratory Function Stable  Post-op Vital Signs: Reviewed  Filed Vitals:   06/16/14 1440  BP:   Pulse: 100  Temp:   Resp: 20    Complications: No apparent anesthesia complications

## 2014-06-16 NOTE — Transfer of Care (Signed)
Immediate Anesthesia Transfer of Care Note  Patient: Lance Powell  Procedure(s) Performed: Procedure(s): LAPAROSCOPIC CHOLECYSTECTOMY (N/A) POSSIBLE OPEN CHOLECYSTECTOMY (N/A)  Patient Location: PACU  Anesthesia Type:General  Level of Consciousness: awake, sedated and patient cooperative  Airway & Oxygen Therapy: Patient Spontanous Breathing and Patient connected to face mask oxygen  Post-op Assessment: Report given to RN and Post -op Vital signs reviewed and stable  Post vital signs: Reviewed and stable  Last Vitals:  Filed Vitals:   06/16/14 1008  BP: 129/73  Pulse: 96  Temp: 36.6 C  Resp: 16    Complications: No apparent anesthesia complications

## 2014-06-16 NOTE — Progress Notes (Signed)
Pt placed on home CPAP via FFM with 3 LPM O2 bleed in.  Pt tolerating well at this time, RT to monitor and assess as needed.

## 2014-06-16 NOTE — Brief Op Note (Signed)
06/16/2014  1:38 PM  PATIENT:  Lance Powell  81 y.o. male  PRE-OPERATIVE DIAGNOSIS:  Chronic calculous Cholecystitis  POST-OPERATIVE DIAGNOSIS:  same  PROCEDURE:  Procedure(s): LAPAROSCOPIC CHOLECYSTECTOMY (N/A) LAPAROSCOPIC LYSIS OF ADHESIONS x 20 minutes  SURGEON:  Surgeon(s) and Role:    * Greer Pickerel, MD - Primary    * Donnie Mesa, MD  PHYSICIAN ASSISTANT: none  ASSISTANTS: see above   ANESTHESIA:   general  EBL:  Total I/O In: 1000 [I.V.:1000] Out: -   BLOOD ADMINISTERED:none  DRAINS: none   LOCAL MEDICATIONS USED:  MARCAINE     SPECIMEN:  Source of Specimen:  gallbladder with drain  DISPOSITION OF SPECIMEN:  PATHOLOGY  COUNTS:  YES  TOURNIQUET:  * No tourniquets in log *  DICTATION: .Other Dictation: Dictation Number [id number]  PLAN OF CARE: Admit for overnight observation  PATIENT DISPOSITION:  PACU - hemodynamically stable.   Delay start of Pharmacological VTE agent (>24hrs) due to surgical blood loss or risk of bleeding: possible

## 2014-06-16 NOTE — Anesthesia Procedure Notes (Signed)
Procedure Name: Intubation Date/Time: 06/16/2014 11:45 AM Performed by: Johnathan Hausen A Pre-anesthesia Checklist: Patient identified, Emergency Drugs available, Suction available, Patient being monitored and Timeout performed Patient Re-evaluated:Patient Re-evaluated prior to inductionOxygen Delivery Method: Circle system utilized Preoxygenation: Pre-oxygenation with 100% oxygen Intubation Type: Combination inhalational/ intravenous induction Ventilation: Mask ventilation without difficulty Laryngoscope Size: Mac and 4 Grade View: Grade II Tube type: Oral Tube size: 8.0 mm Number of attempts: 1 Airway Equipment and Method: Stylet Placement Confirmation: ETT inserted through vocal cords under direct vision,  positive ETCO2 and breath sounds checked- equal and bilateral Secured at: 22 cm Tube secured with: Tape Dental Injury: Teeth and Oropharynx as per pre-operative assessment

## 2014-06-16 NOTE — Anesthesia Preprocedure Evaluation (Addendum)
Anesthesia Evaluation  Patient identified by MRN, date of birth, ID band Patient awake    Reviewed: Allergy & Precautions, H&P , NPO status , Patient's Chart, lab work & pertinent test results  Airway Mallampati: II  TM Distance: >3 FB Neck ROM: Full    Dental no notable dental hx. (+) Teeth Intact, Dental Advisory Given   Pulmonary sleep apnea and Continuous Positive Airway Pressure Ventilation , COPD COPD inhaler, former smoker,  breath sounds clear to auscultation  Pulmonary exam normal       Cardiovascular hypertension, Pt. on medications Rhythm:Regular Rate:Normal     Neuro/Psych Depression negative neurological ROS     GI/Hepatic Neg liver ROS, GERD-  ,  Endo/Other  diabetes, Type 2, Oral Hypoglycemic Agents  Renal/GU negative Renal ROS  negative genitourinary   Musculoskeletal  (+) Arthritis -, Rheumatoid disorders,    Abdominal   Peds  Hematology negative hematology ROS (+)   Anesthesia Other Findings   Reproductive/Obstetrics negative OB ROS                            Anesthesia Physical Anesthesia Plan  ASA: III  Anesthesia Plan: General   Post-op Pain Management:    Induction: Intravenous  Airway Management Planned: Oral ETT  Additional Equipment:   Intra-op Plan:   Post-operative Plan: Extubation in OR  Informed Consent: I have reviewed the patients History and Physical, chart, labs and discussed the procedure including the risks, benefits and alternatives for the proposed anesthesia with the patient or authorized representative who has indicated his/her understanding and acceptance.   Dental advisory given  Plan Discussed with: CRNA  Anesthesia Plan Comments:         Anesthesia Quick Evaluation

## 2014-06-16 NOTE — H&P (View-Only) (Signed)
Lance Powell. Lance Powell 03/30/2014 9:00 AM Location: [facility] Patient #: [id number] DOB: 01/04/1930 Married / Language: Cleophus Molt / Race: White Male  History of Present Illness Lance Hiss M. Lance Brunelle MD; 04/02/2014 12:51 PM) Patient words: recheck drain.  The patient is a 81 year old male who presents for evaluation of gallbladder disease. He comes in for hospital follow-up. I initially met him in the hospital in early February for acute cholecystitis with calculus as well as sigmoid diverticulitis. It appeared that he initially developed some sigmoid diverticulitis which was getting better and then developed right upper quadrant pain which prompted him to come to the emergency room. He did have evidence of sigmoid diverticulitis on his CT along with acute cholecystitis with calculus. He underwent percutaneous drainage of his gallbladder on February 7. He has numerous comorbidities including diabetes mellitus, COPD, obstructive sleep apnea on CPAP, hypertension. He was discharged on February 9. He states he is doing well. He denies any abdominal pain. He denies any fevers or chills. He denies any nausea or vomiting. His drain is putting out around 100 cc a day. He denies any lightheadedness or dizziness. He saw the pulmonologist on March 8. His daughter is staying with him temporarily. He has a son who is in medical school. His son also is a Conservator, museum/gallery. He was felt to be at moderate risk for postoperative pulmonary complications when seen by pulmonary yesterday   Other Problems Lance Powell, CMA; 03/30/2014 9:33 AM) Anxiety Disorder Arthritis Back Pain Bladder Problems Cholelithiasis Chronic Obstructive Lung Disease Diabetes Mellitus Diverticulosis Hemorrhoids High blood pressure Home Oxygen Use Hypercholesterolemia Inguinal Hernia Sleep Apnea  Past Surgical History Lance Curry, MD; 04/02/2014 1:00 PM) Colon Polyp Removal - Colonoscopy Colon  Polyp Removal - Open Tonsillectomy Vasectomy Nissen Fundoplication Colon Removal - Partial Sigmoid colectomy with low anterior anastomosis along with partial cystectomy 2004 Cystectomy; Partial2004 Inguinal Hernia Repair-Bilateral Appendectomy  Diagnostic Studies History Lance Powell, CMA; 03/30/2014 9:33 AM) Colonoscopy 1-5 years ago  Allergies Lance Powell, CMA; 03/30/2014 9:34 AM) No Known Drug Allergies03/09/2014  Medication History Lance Powell, CMA; 03/30/2014 9:35 AM) Zolpidem Tartrate (5MG Tablet, Oral) Active. Amoxicillin-Pot Clavulanate (875-125MG Tablet, Oral) Active. Atorvastatin Calcium (40MG Tablet, Oral) Active. Ciprofloxacin HCl (500MG Tablet, Oral) Active. Enalapril Maleate (2.5MG Tablet, Oral) Active. MetroNIDAZOLE (0.75% Cream, External) Active. MetroNIDAZOLE (500MG Tablet, Oral) Active. Misoprostol (100MCG Tablet, Oral) Active. PARoxetine HCl (20MG Tablet, Oral) Active. ProAir HFA (108 (90 Base)MCG/ACT Aerosol Soln, Inhalation) Active. Medications Reconciled  Social History Lance Powell, [place]; 03/30/2014 9:33 AM) Alcohol use Remotely quit alcohol use. Caffeine use Coffee, Tea. No drug use Tobacco use Former smoker.  Family History Lance Powell, [place]; 03/30/2014 9:33 AM) Cancer Sister. Heart Disease Mother. Kidney Disease Son. Melanoma Daughter. Migraine Headache Daughter. Respiratory Condition Daughter.  Review of Systems Lance Powell CMA; 03/30/2014 9:33 AM) General Present- Fatigue and Weight Loss. Not Present- Appetite Loss, Chills, Fever, Night Sweats and Weight Gain. Skin Not Present- Change in Wart/Mole, Dryness, Hives, Jaundice, New Lesions, Non-Healing Wounds, Rash and Ulcer. HEENT Present- Hearing Loss and Wears glasses/contact lenses. Not Present- Earache, Hoarseness, Nose Bleed, Oral Ulcers, Ringing in the Ears, Seasonal Allergies, Sinus Pain, Sore Throat, Visual Disturbances and Yellow Eyes. Breast Not Present- Breast  Mass, Breast Pain, Nipple Discharge and Skin Changes. Cardiovascular Not Present- Chest Pain, Difficulty Breathing Lying Down, Leg Cramps, Palpitations, Rapid Heart Rate, Shortness of Breath and Swelling of Extremities. Male Genitourinary Present- Impotence. Not Present- Blood in Urine, Change in Urinary Stream, Frequency, Nocturia,  Painful Urination, Urgency and Urine Leakage.   Vitals Lance Powell CMA; 03/30/2014 9:35 AM) 03/30/2014 9:35 AM Weight: 243 lb Height: 72in Body Surface Area: 2.37 m Body Mass Index: 32.96 kg/m Temp.: 96.5F(Temporal)  Pulse: 108 (Regular)  BP: 132/68 (Sitting, Left Arm, Standard)    Physical Exam Lance Hiss M. Alycen Mack MD; 04/02/2014 12:48 PM) General Mental Status-Alert. General Appearance-Consistent with stated age. Hydration-Well hydrated. Voice-Normal. Note: Obese   Head and Neck Head-normocephalic, atraumatic with no lesions or palpable masses. Trachea-midline. Thyroid Gland Characteristics - normal size and consistency.  Eye Eyeball - Bilateral-Extraocular movements intact. Sclera/Conjunctiva - Bilateral-No scleral icterus.  Chest and Lung Exam Chest and lung exam reveals -quiet, even and easy respiratory effort with no use of accessory muscles and on auscultation, normal breath sounds, no adventitious sounds and normal vocal resonance. Inspection Chest Wall - Normal. Back - normal.  Breast - Did not examine.  Cardiovascular Cardiovascular examination reveals -normal heart sounds, regular rate and rhythm with no murmurs and normal pedal pulses bilaterally.  Abdomen Inspection  Inspection of the abdomen reveals: Note: Ventral incisional hernia, reducible, periumbilical area; gallbladder drain lateral right upper quadrant?bilious drainage in bag, drain site okay. Skin - Scar - Note: Long midline incision, bilateral groin incisions, bright lower abdominal paramedian incision. Palpable fascial defect around the  level of the umbilicus, reducible hernia. Nontender. Palpation/Percussion Palpation and Percussion of the abdomen reveal - Soft, Non Tender, No Rebound tenderness, No Rigidity (guarding) and No hepatosplenomegaly. Auscultation Auscultation of the abdomen reveals - Bowel sounds normal.  Peripheral Vascular Upper Extremity Palpation - Pulses bilaterally normal.  Neurologic Neurologic evaluation reveals -alert and oriented x 3 with no impairment of recent or remote memory. Mental Status-Normal.  Neuropsychiatric The patient's mood and affect are described as -normal. Judgment and Insight-insight is appropriate concerning matters relevant to self.  Musculoskeletal Normal Exam - Left-Upper Extremity Strength Normal and Lower Extremity Strength Normal. Normal Exam - Right-Upper Extremity Strength Normal and Lower Extremity Strength Normal.  Lymphatic Head & Neck  General Head & Neck Lymphatics: Bilateral - Description - Normal. Axillary - Did not examine. Femoral & Inguinal - Did not examine.    Assessment & Plan Lance Hiss M. Joline Encalada MD; 04/02/2014 12:58 PM) CHRONIC CHOLECYSTITIS WITH CALCULUS (574.10  K80.10) Impression: He appears to be doing well with respect to his cholecystitis. He clinically appears well. He does have a large gallstone. We first need to get a cholangiogram through his gallbladder drain to evaluate his bile ducts. Even if his biliary tree is patent and his gallbladder drains normally I think it he is at significant risk for recurrent cholecystitis given his large gallstone. Therefore I think we will ultimately need to remove his gallbladder. I explained to him and his daughter that we would need cardiac clearance. Once we have obtained cardiac clearance will be scheduled for surgery. We will get a gallbladder drain study and cardiac clearance and then schedule him for surgery. We discussed the risk and benefits of surgery. Please see discussion below. I did  advise him and his daughter that he was at increased risk for heart and pulmonary complications. We also discussed that he is at increased risk for conversion to an open procedure given his extensive prior abdominal surgery. I did not recommend fixing his incisional hernia at the same time as cholecystectomy since the gallbladder surgery is considered a contaminated procedure. However we did discuss that if for some reason we needed to fix it at the time of gallbladder surgery that it  with either be done primarily or with biologic mesh and that his risk of recurrence would be elevated Current Plans  Provided with written educational materials Instructions: We will arrange a cholangiogram through your current gallbladder drain to evaluate your bile ducts. We will also arrange cardiac consultation for clearance for surgery Once we obtain cardiology clearance our office will contact you to schedule gallbladder surgery-laparoscopic cholecystectomy with possible open cholecystectomy  I believe the patient's symptoms are consistent with gallbladder disease.  We discussed gallbladder disease. The patient was given Neurosurgeon. We discussed non-operative and operative management. We discussed the signs & symptoms of acute cholecystitis  I discussed laparoscopic cholecystectomy with IOC & open in detail. The patient was given educational material as well as diagrams detailing the procedure. We discussed the risks and benefits of a laparoscopic cholecystectomy including, but not limited to bleeding, infection, injury to surrounding structures such as the intestine or liver, bile leak, retained gallstones, need to convert to an open procedure, prolonged diarrhea, blood clots such as DVT, common bile duct injury, anesthesia risks, and possible need for additional procedures. We discussed the typical post-operative recovery course. I explained that the likelihood of improvement of their symptoms is  good.  The patient has elected to proceed with surgery once we get cardiac clearance CHOLECYSTOSTOMY CARE (V55.4  Z43.4) Impression: Continue current care, gallbladder drain to gravity. We'll set him up for gallbladder drain study CHRONIC OBSTRUCTIVE PULMONARY DISEASE, UNSPECIFIED COPD TYPE (496  J44.9) Impression: Appears to be stable with respect to his COPD. Just saw the pulmonologist yesterday. Appears to be maximized as best as possible ESSENTIAL HYPERTENSION (401.9  I10) SLEEP APNEA IN ADULT (327.23  G47.33) Impression: Discussed the importance of him wearing his CPAP CONTROLLED TYPE 2 DIABETES MELLITUS (250.00  E11.9) DIVERTICULITIS, COLON (562.11  K57.32) Impression: Appears to have resolved. 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 It is reducible and nontender. I did not recommend repair at the same time as this cholecystectomy since his gallbladder would be considered "dirty ". I am hopeful that we can do a cholecystectomy without having to deal with his hernia. I did advise him there is a small possibility that we may have to deal with it at the time of surgery. We discussed that it would be a primary muscle repair or biologic mesh and that the repair would be at increased risk for failure  [name]. Redmond Pulling, MD, FACS General, Bariatric, & Minimally Invasive Surgery [facility], [place]

## 2014-06-17 ENCOUNTER — Encounter (HOSPITAL_COMMUNITY): Payer: Self-pay | Admitting: General Surgery

## 2014-06-17 DIAGNOSIS — K8010 Calculus of gallbladder with chronic cholecystitis without obstruction: Secondary | ICD-10-CM | POA: Diagnosis not present

## 2014-06-17 LAB — CBC
HCT: 44.3 % (ref 39.0–52.0)
Hemoglobin: 14 g/dL (ref 13.0–17.0)
MCH: 30.6 pg (ref 26.0–34.0)
MCHC: 31.6 g/dL (ref 30.0–36.0)
MCV: 96.9 fL (ref 78.0–100.0)
PLATELETS: 162 10*3/uL (ref 150–400)
RBC: 4.57 MIL/uL (ref 4.22–5.81)
RDW: 15 % (ref 11.5–15.5)
WBC: 10.9 10*3/uL — AB (ref 4.0–10.5)

## 2014-06-17 LAB — BASIC METABOLIC PANEL
Anion gap: 8 (ref 5–15)
BUN: 12 mg/dL (ref 6–20)
CALCIUM: 9.2 mg/dL (ref 8.9–10.3)
CO2: 28 mmol/L (ref 22–32)
Chloride: 103 mmol/L (ref 101–111)
Creatinine, Ser: 1 mg/dL (ref 0.61–1.24)
GFR calc Af Amer: >60 mL/min (ref >60)
GFR calc non Af Amer: >60 mL/min (ref >60)
GLUCOSE: 127 mg/dL — AB (ref 65–99)
Potassium: 4.3 mmol/L (ref 3.5–5.1)
Sodium: 139 mmol/L (ref 135–145)

## 2014-06-17 LAB — GLUCOSE, CAPILLARY
GLUCOSE-CAPILLARY: 143 mg/dL — AB (ref 65–99)
GLUCOSE-CAPILLARY: 129 mg/dL — AB (ref 65–99)
Glucose-Capillary: 122 mg/dL — ABNORMAL HIGH (ref 65–99)
Glucose-Capillary: 137 mg/dL — ABNORMAL HIGH (ref 65–99)

## 2014-06-17 MED ORDER — PHENOL 1.4 % MT LIQD
1.0000 | OROMUCOSAL | Status: DC | PRN
  Administered 2014-06-17 – 2014-06-18 (×2): 1 via OROMUCOSAL
  Filled 2014-06-17: qty 177

## 2014-06-17 MED ORDER — ARFORMOTEROL TARTRATE 15 MCG/2ML IN NEBU
15.0000 ug | INHALATION_SOLUTION | Freq: Two times a day (BID) | RESPIRATORY_TRACT | Status: DC
  Administered 2014-06-17 – 2014-06-19 (×4): 15 ug via RESPIRATORY_TRACT
  Filled 2014-06-17 (×6): qty 2

## 2014-06-17 MED ORDER — TIOTROPIUM BROMIDE MONOHYDRATE 18 MCG IN CAPS
18.0000 ug | ORAL_CAPSULE | Freq: Every day | RESPIRATORY_TRACT | Status: DC
  Administered 2014-06-17 – 2014-06-19 (×3): 18 ug via RESPIRATORY_TRACT
  Filled 2014-06-17: qty 5

## 2014-06-17 NOTE — Op Note (Signed)
NAMESOVEREIGN, RAMIRO NO.:  [id number]  MEDICAL RECORD NO.:  [id number]  LOCATION:  64                         FACILITY:  [facility]  PHYSICIAN:  [name]. Redmond Pulling, MD, FACSDATE OF BIRTH:  1929/11/02  DATE OF PROCEDURE:  06/16/2014 DATE OF DISCHARGE:                              OPERATIVE REPORT   PREOPERATIVE DIAGNOSIS:  Chronic calculous cholecystitis.  POSTOPERATIVE DIAGNOSIS:  Chronic calculous cholecystitis.  PROCEDURES: 1. Laparoscopic lysis of adhesions for 20 minutes. 2. Laparoscopic cholecystectomy.  SURGEON:  [name]. Redmond Pulling, MD, FACS  ASSISTANT SURGEON:  Imogene Burn. Tsuei, M.D.  ANESTHESIA:  General.  EBL:  50 mL.  SPECIMEN:  Gallbladder.  INDICATIONS FOR SURGERY:  The patient is an 81 year old gentleman who was admitted to the hospital back in early February, with sigmoid diverticulitis as well as calculous cholecystitis.  He had been having pain for over a week and it was felt that he would be high risk for an open procedure.  He had numerous comorbidities at that time as well.  It was felt that he would be best managed by percutaneous cholecystostomy tube followed by interval cholecystectomy.  He underwent placement of a cholecystostomy tube on February 22, 2014, and ultimately discharged from the hospital.  I had seen him several times in the office.  Given the fact that he had gallstones within his gallbladder as well as the fact that a followup cholangiogram through his cholecystostomy tube showed cystic duct obstruction ongoing.  I felt that he would be at high risk for recurrent cholecystitis; therefore, I recommended laparoscopic cholecystectomy with possible open cholecystectomy.  He was seen and evaluated by Pulmonary and cleared for surgery.  I had an extensive discussion with him and his daughter in the office regarding risks and benefits of surgery including, but not limited to bleeding, infection, injury to surrounding structures,  need to convert to an open procedure, bile leak, blood clot formation, perioperative cardiac and pulmonary issues, injury to surrounding structures, injury to the common bile duct as well as the typical postoperative recovery course and prolonged hospitalization given his comorbidities.  He had had multiple prior laparotomies and in fact had a periumbilical incisional hernia toward the right upper quadrant.  I did not recommend repair of the hernia at the same time of this procedure.  DESCRIPTION OF PROCEDURE:  After obtaining informed consent, the patient was taken to OR #12 at [facility], placed supine on the operating table.  General endotracheal anesthesia was established. Sequential compression devices were placed.  Surgical time-out was performed.  He received IV antibiotics prior to skin incision.  He had had numerous prior laparotomy.  He had a full midline incision, a right paramedian incision with an incisional hernia at the apex.  He also had a loss of domain, it was unclear exactly what all his prior abdominal surgeries had been.  Therefore, I felt the safest way to enter his abdomen would be a cutdown in the midline.  Local was infiltrated in the supraumbilical position.  I then used a 15-blade to cut through the skin and subcutaneous tissue.  Kochers were placed on fascia.  I then carefully entered the abdominal cavity using pair of  Metzenbaum scissors.  The abdominal cavity was entered.  I was able to finger sweep.  There were some surrounding adhesions.  I was able to get a 12- mm Hasson trocar in.  Upon looking prior to placing the Hasson, I did look around the fascial edges and I saw no small bowel tethered in the immediate vicinity.  The Hasson trocar was placed.  The pneumoperitoneum was smoothly established up to the patient's pressure of 15 mmHg.  The laparoscope was advanced.  Unfortunately, it appeared his entire omentum was plastered to his anterior  abdominal wall as evidenced by the fact that the only bowel I could see was small intestine.  There was also some small bowel loops that were tethered in the left midabdomen.  The transverse colon was visible, but we were clearly underneath the omentum as we could see the tenia coli along the mesenteric border of the colon. I had a large, adequate pneumoperitoneum, so I felt I could try an Optiview technique in the right upper quadrant.  A small incision was made in the right subcostal margin.  Then using a 0-degree laparoscope through a 5-mm trocar, I advanced it all through the abdominal wall and I was able to enter the upper abdomen.  There were no adhesions in this upper abdomen.  The liver was visualized and the gallbladder was visualized along with the cholecystostomy tube.  There was dense what appeared to be omental adhesions to the abdominal wall.  I placed a 5-mm trocar in the right lateral abdominal cavity under direct visualization after local had been infiltrated.  We then started taking down some of these adhesions, to be quite obvious that he had had prior mesh inserted in his abdomen.  We ended up in the plane between the mesh and the abdominal wall.  We did not go between the omentum and bowel on the downside of the mesh, we were actually taking down the mesh off the abdominal wall.  I cleared enough space where I could get a 5-mm trocar in the right hypochondrium.  We did not take it down to the point where we could visualize the Hasson trocar.  An 11-mm trocar was placed in the epigastrium under direct visualization.  He was then placed in the appropriate position for cholecystectomy.  There were some omentum on top of the cholecystostomy tube and this was taken down with hook electrocautery as well as scissors with electrocautery.  The gallbladder was grasped and retracted the right shoulder.  The peritoneal attachments overlying the gallbladder were then incised with  hook electrocautery both medially and laterally.  The node of Calot was identified.  The cystic artery was circumferentially dissected out and around.  The cystic duct was then visualized and it was circumferentially dissected out and around.  I did enter the gallbladder at the infundibulum, but there was no evidence of common bile duct injury.  I created a large window around the cystic artery and the cystic duct.  The cystic artery was taken with two clips on the proximal side and one as it entered.  The gallbladder was then transected with Endo Shears.  This left a very large window around the cystic duct.  It was somewhat dilated.  I did not do a cholangiogram because of prior attempted cholangiogram to his cholecystostomy tube showed complete cystic duct obstruction.  Two clips were placed on the biliary aspect of the cystic duct.  It appeared to go completely across and one as it entered  the gallbladder was then transected with Endo Shears.  I did elect to place a PDS Endoloop around the cystic duct stump.  We then mobilized the gallbladder up at the gallbladder fossa using hook electrocautery.  We then freed the gallbladder from the remaining liver. The cholecystostomy tube was then cut intra-abdominally.  An EndoCatch bag was placed through the epigastric trocar and the gallbladder with portions of the cholecystostomy tube were placed within the specimen bag.  After enlarging the fascial incision at the epigastric site, I was able to extract the EndoCatch bag with the gallbladder.  The trocar was placed.  We then obtained hemostasis in the gallbladder fossa and irrigated it with saline.  I then re-visualized the abdominal wall in the right upper quadrant and midabdomen.  There was no evidence of bowel injury.  There was a little bit of ooze from two places in the abdominal wall and this was taken care with electrocautery.  I then removed the epigastric trocar and closed the  fascial defect with 0 Vicryl using an Endoclose suture passer.  Pneumoperitoneum was released.  Remaining trocars were removed.  The Hasson trocar in the supraumbilical position was removed, I did a finger sweep, could not appreciate any bowel just right next to our suture placement.  Using a Kocher on each edge, I looked in, did not see any bowel where I had placed the pursestring suture.  I then tied down the pursestring suture thus obliterating the facile defect.  Remaining skin incisions were closed with the 4- 0 Monocryl followed by the application of Dermabond.  All cholecystostomy tube drain site was covered with a dry gauze and Tegaderm and the patient was extubated and brought to the recovery room in stable condition.  All needle, instrument, and sponge counts were correct x2.  There were no immediate complications.     [name]. Redmond Pulling, MD, FACS     EMW/MEDQ  D:  06/16/2014  T:  06/17/2014  Job:  [id number]

## 2014-06-17 NOTE — Discharge Instructions (Signed)
[facility] °LAPAROSCOPIC SURGERY: POST OP INSTRUCTIONS °Always review your discharge instruction sheet given to you by the facility where your surgery was performed. °IF YOU HAVE DISABILITY OR FAMILY LEAVE FORMS, YOU MUST BRING THEM TO THE OFFICE FOR PROCESSING.   °DO NOT GIVE THEM TO YOUR DOCTOR. ° °1. A prescription for pain medication may be given to you upon discharge.  Take your pain medication as prescribed, if needed.  If narcotic pain medicine is not needed, then you may take acetaminophen (Tylenol) or ibuprofen (Advil) as needed. °2. Take your usually prescribed medications unless otherwise directed. °3. If you need a refill on your pain medication, please contact your pharmacy.  They will contact our office to request authorization. Prescriptions will not be filled after 5pm or on week-ends. °4. You should follow a light diet the first few days after arrival home, such as soup and crackers, etc.  Be sure to include lots of fluids daily. °5. Most patients will experience some swelling and bruising in the area of the incisions.  Ice packs will help.  Swelling and bruising can take several days to resolve.  °6. It is common to experience some constipation if taking pain medication after surgery.  Increasing fluid intake and taking a stool softener (such as Colace) will usually help or prevent this problem from occurring.  A mild laxative (Milk of Magnesia or Miralax) should be taken according to package instructions if there are no bowel movements after 48 hours. °7. Unless discharge instructions indicate otherwise, you may remove your bandages 24-48 hours after surgery, and you may shower at that time.  You may have steri-strips (small skin tapes) in place directly over the incision.  These strips should be left on the skin for 7-10 days.  If your surgeon used skin glue on the incision, you may shower in 24 hours.  The glue will flake off over the next 2-3 weeks.  Any sutures or  staples will be removed at the office during your follow-up visit. °8. ACTIVITIES:  You may resume regular (light) daily activities beginning the next day--such as daily self-care, walking, climbing stairs--gradually increasing activities as tolerated.  You may have sexual intercourse when it is comfortable.  Refrain from any heavy lifting or straining until approved by your doctor. °a. You may drive when you are no longer taking prescription pain medication, you can comfortably wear a seatbelt, and you can safely maneuver your car and apply brakes. °9. You should see your doctor in the office for a follow-up appointment approximately 2-3 weeks after your surgery.  Make sure that you call for this appointment within a day or two after you arrive home to insure a convenient appointment time. °10. OTHER INSTRUCTIONS:  °WHEN TO CALL YOUR DOCTOR: °1. Fever over 101.0 °2. Inability to urinate °3. Continued bleeding from incision. °4. Increased pain, redness, or drainage from the incision. °5. Increasing abdominal pain ° °The clinic staff is available to answer your questions during regular business hours.  Please don’t hesitate to call and ask to speak to one of the nurses for clinical concerns.  If you have a medical emergency, go to the nearest emergency room or call 911.  A surgeon from [facility] is always on call at the hospital. °[address], [address], [place], [place]  [address] ? [address] [address], [place], [place]   [address] °[phone] ? [phone] ? FAX [phone] °Web site: [email or web address] ° °

## 2014-06-17 NOTE — Progress Notes (Signed)
1 Day Post-Op  Subjective: Having some RUQ pain. No n/v. Tolerated clears.   Objective: Vital signs in last 24 hours: Temp:  [97.3 F (36.3 C)-98.6 F (37 C)] 98.1 F (36.7 C) (05/27 0800) Pulse Rate:  [79-119] 91 (05/27 1200) Resp:  [10-35] 23 (05/27 1200) BP: (102-159)/(51-96) 114/58 mmHg (05/27 1200) SpO2:  [88 %-98 %] 89 % (05/27 1200) Last BM Date: 06/13/14  Intake/Output from previous day: 05/26 0701 - 05/27 0700 In: 2821.7 [I.V.:2711.7; IV Piggyback:110] Out: 675 [Urine:675] Intake/Output this shift: Total I/O In: 341.7 [I.V.:286.7; IV Piggyback:55] Out: 725 [Urine:725]  Looks great. In chair. IS 2500 cta b/l Reg Obese, soft, expected RUQ TTP, incisions c/d/i No edema  Lab Results:   Recent Labs  06/17/14 0357  WBC 10.9*  HGB 14.0  HCT 44.3  PLT 162   BMET  Recent Labs  06/17/14 0357  NA 139  K 4.3  CL 103  CO2 28  GLUCOSE 127*  BUN 12  CREATININE 1.00  CALCIUM 9.2   PT/INR No results for input(s): LABPROT, INR in the last 72 hours. ABG No results for input(s): PHART, HCO3 in the last 72 hours.  Invalid input(s): PCO2, PO2  Studies/Results: No results found.  Anti-infectives: Anti-infectives    Start     Dose/Rate Route Frequency Ordered Stop   06/16/14 1028  cefOXitin (MEFOXIN) 2 g in dextrose 5 % 50 mL IVPB     2 g 100 mL/hr over 30 Minutes Intravenous On call to O.R. 06/16/14 1028 06/16/14 1135      Assessment/Plan: s/p Procedure(s): LAPAROSCOPIC CHOLECYSTECTOMY (N/A) For chronic cholecystitis, h/o cholecystostomy tube COPD OSA on cpap HTN  Looks great. Doing great with IS. CPAP at night, uses O2 as needed during daytime.  Urinary retention - pt in/out catherize's himself as needed. Bladder scan per shift. I&o as needed for large post void residual Cont home meds Will tx to floor Discussed intraop findings. Had to peel off mesh off of RUQ abd wall to get window to do surgery. Oozy. Therefore delay heparin to noon today.   PT consult  Probable dc Saturday  [name]. Redmond Pulling, MD, FACS General, Bariatric, & Minimally Invasive Surgery [facility], [place]      [facility] Jerilynn Mages 06/17/2014

## 2014-06-17 NOTE — Progress Notes (Signed)
PT Cancellation Note  Patient Details Name: KENNA KIRN MRN: [id number] DOB: 1929-08-05   Cancelled Treatment:     PT eval attempted but deferred at request of pt 2* fatigue and pain with movement.  Will follow.   Jahron Hunsinger 06/17/2014, 12:54 PM

## 2014-06-17 NOTE — Evaluation (Signed)
Physical Therapy Evaluation Patient Details Name: Lance Powell MRN: [id number] DOB: 05-14-1929 Today's Date: 06/17/2014   History of Present Illness  Chronic cholecystitis - s/p lap chole 5/26.  Clinical Impression  Pt s/p lap chole presents with functional mobility limitations 2* post op pain.  Pt should progress to dc home with assist of family.    Follow Up Recommendations No PT follow up    Equipment Recommendations  None recommended by PT    Recommendations for Other Services OT consult     Precautions / Restrictions Precautions Precautions: Fall Restrictions Weight Bearing Restrictions: No      Mobility  Bed Mobility Overal bed mobility: Needs Assistance Bed Mobility: Supine to Sit;Sit to Supine     Supine to sit: Min guard Sit to supine: Min assist   General bed mobility comments: cues for roll in/out bed with pt utilizing bed rail.  Physical assist to bring legs up onto bed  Transfers Overall transfer level: Needs assistance Equipment used: Rolling walker (2 wheeled) Transfers: Sit to/from Stand Sit to Stand: Min guard         General transfer comment: cues for transition position and use of UEs to self assist  Ambulation/Gait Ambulation/Gait assistance: Min guard Ambulation Distance (Feet): 300 Feet Assistive device: Rolling walker (2 wheeled);None Gait Pattern/deviations: Step-through pattern;Shuffle;Trunk flexed;Wide base of support     General Gait Details: 200' with RW and 100' with no AD.  Pt ambulating decreased pace with wide BOS and decreased knee flex at swing phase but with Fair+ stability.  Stairs            Wheelchair Mobility    Modified Rankin (Stroke Patients Only)       Balance                                             Pertinent Vitals/Pain Pain Assessment: 0-10 Pain Score: 3  Pain Location: lower abdominal Pain Descriptors / Indicators: Sore Pain Intervention(s): Limited activity within  patient's tolerance;Monitored during session;Premedicated before session    Home Living Family/patient expects to be discharged to:: Private residence Living Arrangements: Spouse/significant other Available Help at Discharge: Family Type of Home: House Home Access: Stairs to enter Entrance Stairs-Rails: None Technical brewer of Steps: 1 Home Layout: One level Home Equipment: Shower seat;Grab bars - tub/shower;Walker - 2 wheels Additional Comments: Pt lives with "handicapped wife" and dtr who is assisting with care    Prior Function Level of Independence: Independent         Comments: 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 His daughter has been staying with them the last few months to assist.     Hand Dominance   Dominant Hand: Right    Extremity/Trunk Assessment   Upper Extremity Assessment: Overall WFL for tasks assessed           Lower Extremity Assessment: Overall WFL for tasks assessed         Communication   Communication: No difficulties;HOH  Cognition Arousal/Alertness: Awake/alert Behavior During Therapy: WFL for tasks assessed/performed Overall Cognitive Status: Within Functional Limits for tasks assessed                      General Comments      Exercises        Assessment/Plan    PT Assessment Patient needs continued PT services  PT Diagnosis Difficulty walking   PT Problem List Decreased activity tolerance;Decreased balance;Decreased mobility;Decreased knowledge of use of DME;Obesity;Pain  PT Treatment Interventions DME instruction;Gait training;Stair training;Functional mobility training;Therapeutic activities;Therapeutic exercise;Patient/family education   PT Goals (Current goals can be found in the Care Plan section) Acute Rehab PT Goals Patient Stated Goal: Home PT Goal Formulation: With patient Time For Goal Achievement: 07/01/14 Potential to Achieve Goals: Good    Frequency Min 3X/week   Barriers to  discharge        Co-evaluation               End of Session   Activity Tolerance: Patient tolerated treatment well Patient left: in bed;with call bell/phone within reach Nurse Communication: Mobility status    Functional Assessment Tool Used: Clinical judgement Functional Limitation: Mobility: Walking and moving around Mobility: Walking and Moving Around Current Status (Y7062): At least 20 percent but less than 40 percent impaired, limited or restricted Mobility: Walking and Moving Around Goal Status [phone]): At least 1 percent but less than 20 percent impaired, limited or restricted    Time: 1418-1450 PT Time Calculation (min) (ACUTE ONLY): 32 min   Charges:   PT Evaluation $Initial PT Evaluation Tier I: 1 Procedure PT Treatments $Gait Training: 8-22 mins   PT G Codes:   PT G-Codes **NOT FOR INPATIENT CLASS** Functional Assessment Tool Used: Clinical judgement Functional Limitation: Mobility: Walking and moving around Mobility: Walking and Moving Around Current Status (B1517): At least 20 percent but less than 40 percent impaired, limited or restricted Mobility: Walking and Moving Around Goal Status [phone]): At least 1 percent but less than 20 percent impaired, limited or restricted    Lamica Mccart 06/17/2014, 5:14 PM

## 2014-06-17 NOTE — Progress Notes (Signed)
RT placed pt on home CPAP (settings unknown) via ffm with 2Lpm of oxygen bled in per home settings. Sterile water was added for humidification. Machine appears to be in good working condition and there are no visible frays in the power cord. [employer] has been contacted and will inspect home CPAP tomorrow when available. Pt is resting comfortably and is tolerating CPAP well at this time. RT will continue to monitor as needed.

## 2014-06-18 DIAGNOSIS — K8010 Calculus of gallbladder with chronic cholecystitis without obstruction: Secondary | ICD-10-CM | POA: Diagnosis not present

## 2014-06-18 LAB — BASIC METABOLIC PANEL
Anion gap: 8 (ref 5–15)
BUN: 13 mg/dL (ref 6–20)
CHLORIDE: 103 mmol/L (ref 101–111)
CO2: 24 mmol/L (ref 22–32)
Calcium: 9.1 mg/dL (ref 8.9–10.3)
Creatinine, Ser: 0.94 mg/dL (ref 0.61–1.24)
Glucose, Bld: 150 mg/dL — ABNORMAL HIGH (ref 65–99)
POTASSIUM: 3.9 mmol/L (ref 3.5–5.1)
Sodium: 135 mmol/L (ref 135–145)

## 2014-06-18 LAB — GLUCOSE, CAPILLARY
GLUCOSE-CAPILLARY: 93 mg/dL (ref 65–99)
GLUCOSE-CAPILLARY: 107 mg/dL — AB (ref 65–99)
Glucose-Capillary: 122 mg/dL — ABNORMAL HIGH (ref 65–99)
Glucose-Capillary: 104 mg/dL — ABNORMAL HIGH (ref 65–99)

## 2014-06-18 LAB — CBC
HEMATOCRIT: 38.8 % — AB (ref 39.0–52.0)
Hemoglobin: 12.3 g/dL — ABNORMAL LOW (ref 13.0–17.0)
MCH: 30.2 pg (ref 26.0–34.0)
MCHC: 31.7 g/dL (ref 30.0–36.0)
MCV: 95.3 fL (ref 78.0–100.0)
Platelets: 143 10*3/uL — ABNORMAL LOW (ref 150–400)
RBC: 4.07 MIL/uL — ABNORMAL LOW (ref 4.22–5.81)
RDW: 14.9 % (ref 11.5–15.5)
WBC: 9.7 10*3/uL (ref 4.0–10.5)

## 2014-06-18 LAB — MAGNESIUM: MAGNESIUM: 1.7 mg/dL (ref 1.7–2.4)

## 2014-06-18 NOTE — Progress Notes (Signed)
Physical Therapy Treatment Patient Details Name: Lance Powell MRN: [id number] DOB: [birth year] Today's Date: 07-13-2014    History of Present Illness Chronic cholecystitis - s/p lap chole 5/26.    PT Comments    Improvement in activity tolerance with pt maintaining 91%+ while ambulating on 2L O2.  Follow Up Recommendations  No PT follow up     Equipment Recommendations  None recommended by PT    Recommendations for Other Services OT consult     Precautions / Restrictions Precautions Precautions: Fall Restrictions Weight Bearing Restrictions: No    Mobility  Bed Mobility               General bed mobility comments: up in chair with nursing  Transfers Overall transfer level: Needs assistance Equipment used: Rolling walker (2 wheeled) Transfers: Sit to/from Stand Sit to Stand: Min assist         General transfer comment: cues for transition position and use of UEs to self assist  Ambulation/Gait Ambulation/Gait assistance: Min guard Ambulation Distance (Feet): 600 Feet Assistive device: Rolling walker (2 wheeled);None Gait Pattern/deviations: Step-through pattern;Decreased step length - right;Decreased step length - left;Shuffle;Trunk flexed     General Gait Details: cues for posture and position from RW.  Attempted ambulation sans AD - pt demonstrating increased instability and states "I feel like I am limping.   Stairs            Wheelchair Mobility    Modified Rankin (Stroke Patients Only)       Balance                                    Cognition Arousal/Alertness: Awake/alert Behavior During Therapy: WFL for tasks assessed/performed Overall Cognitive Status: Within Functional Limits for tasks assessed                      Exercises      General Comments        Pertinent Vitals/Pain Pain Assessment: 0-10 Pain Score: 3  Pain Location: lower abdominal  Pain Descriptors / Indicators:  Aching;Sore Pain Intervention(s): Limited activity within patient's tolerance;Monitored during session;Premedicated before session    Home Living                      Prior Function            PT Goals (current goals can now be found in the care plan section) Acute Rehab PT Goals Patient Stated Goal: Home PT Goal Formulation: With patient Time For Goal Achievement: 07/01/14 Potential to Achieve Goals: Good Progress towards PT goals: Progressing toward goals    Frequency  Min 3X/week    PT Plan Current plan remains appropriate    Co-evaluation             End of Session Equipment Utilized During Treatment: Gait belt;Oxygen Activity Tolerance: Patient tolerated treatment well Patient left: in chair;with call bell/phone within reach     Time: 0937-1005 PT Time Calculation (min) (ACUTE ONLY): 28 min  Charges:  $Gait Training: 23-37 mins                    G Codes:      Jaianna Nicoll 2014-07-13, 12:21 PM

## 2014-06-18 NOTE — Progress Notes (Signed)
Patient ID: Lance Powell, male   DOB: [birth year], 81 y.o.   MRN: [id number] 2 Days Post-Op  Subjective: Moderate pain requiring medications. He had difficulty getting around yesterday with pain in his thighs. This is somewhat better. He required catheterization yesterday but voiding okay today. No nausea or vomiting.  Objective: Vital signs in last 24 hours: Temp:  [97.1 F (36.2 C)-98.2 F (36.8 C)] 97.5 F (36.4 C) (05/28 0515) Pulse Rate:  [83-103] 83 (05/28 0515) Resp:  [17-23] 20 (05/28 0515) BP: (95-120)/(44-78) 105/55 mmHg (05/28 0515) SpO2:  [88 %-96 %] 94 % (05/28 0515) Last BM Date: 06/15/14 (per pt)  Intake/Output from previous day: 05/27 0701 - 05/28 0700 In: 1706.7 [P.O.:360; I.V.:1186.7; IV Piggyback:160] Out: 2800 [Urine:2800] Intake/Output this shift: Total I/O In: 240 [P.O.:240] Out: -   General appearance: alert, cooperative, no distress and morbidly obese GI: abnormal findings:  mild tenderness in the upper abdomen Incision/Wound: clean and dry  Lab Results:   Recent Labs  06/17/14 0357 06/18/14 0509  WBC 10.9* 9.7  HGB 14.0 12.3*  HCT 44.3 38.8*  PLT 162 143*   BMET  Recent Labs  06/17/14 0357 06/18/14 0509  NA 139 135  K 4.3 3.9  CL 103 103  CO2 28 24  GLUCOSE 127* 150*  BUN 12 13  CREATININE 1.00 0.94  CALCIUM 9.2 9.1     Studies/Results: No results found.  Anti-infectives: Anti-infectives    Start     Dose/Rate Route Frequency Ordered Stop   06/16/14 1028  cefOXitin (MEFOXIN) 2 g in dextrose 5 % 50 mL IVPB     2 g 100 mL/hr over 30 Minutes Intravenous On call to O.R. 06/16/14 1028 06/16/14 1135      Assessment/Plan: s/p Procedure(s): LAPAROSCOPIC CHOLECYSTECTOMY Doing reasonably well following difficult laparoscopic cholecystectomy. No specific complications. He feels he needs another day in the hospital and I would agree. Plan discharge tomorrow.      Aithan Farrelly T 06/18/2014

## 2014-06-19 DIAGNOSIS — K8010 Calculus of gallbladder with chronic cholecystitis without obstruction: Secondary | ICD-10-CM | POA: Diagnosis not present

## 2014-06-19 LAB — GLUCOSE, CAPILLARY
Glucose-Capillary: 121 mg/dL — ABNORMAL HIGH (ref 65–99)
Glucose-Capillary: 144 mg/dL — ABNORMAL HIGH (ref 65–99)

## 2014-06-19 MED ORDER — OXYCODONE-ACETAMINOPHEN 5-325 MG PO TABS: 1.0000 | ORAL_TABLET | ORAL | Status: DC | PRN

## 2014-06-19 NOTE — Progress Notes (Signed)
Patient ID: Lance Powell, male   DOB: 28-Feb-1929, 81 y.o.   MRN: [id number] 3 Days Post-Op  Subjective: Feels much better today. Abdominal pain resolved. Was having some leg discomfort and weakness that is resolved. Tolerating diet. Ready to go home.  Objective: Vital signs in last 24 hours: Temp:  [98 F (36.7 C)-98.5 F (36.9 C)] 98.5 F (36.9 C) (05/29 0617) Pulse Rate:  [82-91] 91 (05/29 0617) Resp:  [18-20] 18 (05/29 0617) BP: (104-139)/(57-63) 139/63 mmHg (05/29 0617) SpO2:  [91 %-96 %] 96 % (05/29 0841) Last BM Date: 06/15/14 (per pt, denies constipation)  Intake/Output from previous day: 05/28 0701 - 05/29 0700 In: 480 [P.O.:480] Out: 2000 [Urine:2000] Intake/Output this shift: Total I/O In: -  Out: 700 [Urine:700]  General appearance: alert, cooperative and no distress GI: normal findings: soft, non-tender Incision/Wound: clean and dry  Lab Results:   Recent Labs  06/17/14 0357 06/18/14 0509  WBC 10.9* 9.7  HGB 14.0 12.3*  HCT 44.3 38.8*  PLT 162 143*   BMET  Recent Labs  06/17/14 0357 06/18/14 0509  NA 139 135  K 4.3 3.9  CL 103 103  CO2 28 24  GLUCOSE 127* 150*  BUN 12 13  CREATININE 1.00 0.94  CALCIUM 9.2 9.1     Studies/Results: No results found.  Anti-infectives: Anti-infectives    Start     Dose/Rate Route Frequency Ordered Stop   06/16/14 1028  cefOXitin (MEFOXIN) 2 g in dextrose 5 % 50 mL IVPB     2 g 100 mL/hr over 30 Minutes Intravenous On call to O.R. 06/16/14 1028 06/16/14 1135      Assessment/Plan: s/p Procedure(s): LAPAROSCOPIC CHOLECYSTECTOMY Doing well.  Ready for discharge today.      Austynn Pridmore T 06/19/2014

## 2014-06-19 NOTE — Progress Notes (Signed)
Pt discharged.  Denies pain.  No complaints.  2L [place] on home oxygen.  Leaving with one prescription.  Left with home CPAP machine.  No s/s of distress.  Respirations even and unlabored.

## 2014-06-21 ENCOUNTER — Telehealth: Payer: Self-pay | Admitting: *Deleted

## 2014-06-21 NOTE — Telephone Encounter (Signed)
Transition Care Management Follow-up Telephone Call  How have you been since you were released from the hospital? "I'm in more pain than I thought I would be, I'm taking it easy and my daughter is waiting on me hand and foot.  I'm eating and having normal urination and bowel movements"   Do you understand why you were in the hospital? YES    Do you understand the discharge instrcutions? YES   Items Reviewed:  Medications reviewed:  YES   Allergies reviewed: YES  Dietary changes reviewed: YES   Referrals reviewed: YES   Functional Questionnaire:   Activities of Daily Living (ADLs):   He states they are independent in the following: ambulation, restroom  States they require assistance with the following: medications, transportation   Any transportation issues/concerns?: NO- daughter will be coming to appointment    Any patient concerns? NO   Confirmed importance and date/time of follow-up visits scheduled:  YES- scheduled with [name], PA on 06/27/14.    Confirmed with patient if condition begins to worsen call PCP or go to the ER.  Patient was given the Call-a-Nurse line [phone]: YES

## 2014-06-27 ENCOUNTER — Encounter: Payer: Self-pay | Admitting: Medical

## 2014-06-27 ENCOUNTER — Ambulatory Visit (INDEPENDENT_AMBULATORY_CARE_PROVIDER_SITE_OTHER): Payer: Medicare Other | Admitting: Medical

## 2014-06-27 VITALS — BP 130/66 | HR 107 | Temp 97.9°F | Ht 72.0 in | Wt 245.2 lb

## 2014-06-27 DIAGNOSIS — D696 Thrombocytopenia, unspecified: Secondary | ICD-10-CM

## 2014-06-27 DIAGNOSIS — J449 Chronic obstructive pulmonary disease, unspecified: Secondary | ICD-10-CM | POA: Diagnosis not present

## 2014-06-27 DIAGNOSIS — D6489 Other specified anemias: Secondary | ICD-10-CM | POA: Diagnosis not present

## 2014-06-27 DIAGNOSIS — E119 Type 2 diabetes mellitus without complications: Secondary | ICD-10-CM

## 2014-06-27 DIAGNOSIS — G4700 Insomnia, unspecified: Secondary | ICD-10-CM

## 2014-06-27 MED ORDER — CLONAZEPAM 0.25 MG PO TBDP: ORAL_TABLET | ORAL | Status: DC

## 2014-06-27 NOTE — Assessment & Plan Note (Signed)
Pt is former smoker. He stopped smoking 25 yrs ago. Pt has seen a pulmonologist for copd. Sees Dr. [name]. Pt is on titropium bromide ihaler, oxygen, and alubterol.  Pt is on oxygen daily. On 2 liters.   Pt new  pulmonologist is Dr. Lamonte Sakai. Will see him in 5 months.

## 2014-06-27 NOTE — Patient Instructions (Signed)
Doing well post op.   I will recheck cbc since you did have slight drop in anemia markers after surgery.  Do get some daily mild exercise and keep using oxygen and inhalers.  I am prescribing low dose and number of clonazapam for insomnia or anxiety. Can use about every 3rd night. But want this to be dispensed by family member. And only give when family member present in house to watch him.  Follow up in 2 month or as needed  Use oxycodone 1 tab a day for pain. If you have use this at night would recommend you not to use the clonzapem

## 2014-06-27 NOTE — Progress Notes (Signed)
Pre visit review using our clinic review tool, if applicable. No additional management support is needed unless otherwise documented below in the visit note. 

## 2014-06-27 NOTE — Assessment & Plan Note (Signed)
Get a1-c today 

## 2014-06-27 NOTE — Assessment & Plan Note (Signed)
I am prescribing low dose and number of clonazapam for insomnia or anxiety. Can use about every 3rd night. But want this to be dispensed by family member. And only give when family member present in house to watch him.

## 2014-06-27 NOTE — Progress Notes (Signed)
Subjective:    Patient ID: Lance Powell, male    DOB: November 30, 1929, 81 y.o.   MRN: [id number]  HPI  Pt in for follow up on surgery. Pt had gallstone and inflamed gallbladder from feb until may. He had a drainage tube. And eventually he had surgery on May 26th, 2016. Pt is take oxycodone 5/325 mg. Pt will see specialist in 9 days. He is only taking oxycodone 1 tab a day. No GI symptoms since surgery. One of trochar site was painful early on but no longer very painful.  Pt is former smoker. He stopped smoking 25 yrs ago. Pt has seen a pulmonologist for copd. Sees Dr. [name]. Pt is on titropium bromide ihaler, oxygen, and alubterol.  Pt is on oxygen daily. On 2 liters.   Pt new  pulmonologist is Dr. Lamonte Sakai. Will see him in 5 months.  Some insomnia almost every night. Can't relax always thinking about the day and other things. Sometimes he may get agitated easily.      Review of Systems  Constitutional: Negative for fever, chills and fatigue.  Respiratory: Negative for cough, chest tightness, shortness of breath and wheezing.   Cardiovascular: Negative for chest pain and palpitations.  Gastrointestinal: Negative for nausea, vomiting, abdominal pain, diarrhea, constipation, blood in stool and anal bleeding.  Musculoskeletal: Negative for back pain.  Neurological: Negative for dizziness, seizures, syncope, speech difficulty, weakness, numbness and headaches.  Hematological: Negative for adenopathy. Does not bruise/bleed easily.  Psychiatric/Behavioral: Positive for sleep disturbance. Negative for behavioral problems.    Past Medical History  Diagnosis Date  . Osteoarthritis   . Rheumatoid arthritis(714.0)   . Hyperlipemia   . Diabetes mellitus   . COPD (chronic obstructive pulmonary disease)   . Sleep apnea     cpap  . Hypertension   . Depression 03/08/2014  . GERD (gastroesophageal reflux disease) 03/08/2014  . OSA (obstructive sleep apnea)   . History of blood transfusion       History   Social History  . Marital Status: Married    Spouse Name: N/A  . Number of Children: 6  . Years of Education: N/A   Occupational History  . Retired     Chief Financial Officer   Social History Main Topics  . Smoking status: Former Smoker -- 4.00 packs/day for 30 years    Types: Cigarettes    Quit date: 01/21/1978  . Smokeless tobacco: Never Used  . Alcohol Use: No  . Drug Use: No  . Sexual Activity: Not on file   Other Topics Concern  . Not on file   Social History Narrative    Past Surgical History  Procedure Laterality Date  . Appendectomy  1969  . Nissen fundoplication    . Hernia repair  1990    right and left inguinal  . Vasectomy  1972  . Tonsillectomy  1937  . Transurethral resection of prostate  01/23/2011    Procedure: TRANSURETHRAL RESECTION OF THE PROSTATE WITH GYRUS INSTRUMENTS;  Surgeon: Molli Hazard, MD;  [facility];  Service: Urology;  Laterality: N/A;  . Cystoscopy  01/23/2011    Procedure: CYSTOSCOPY;  Surgeon: Molli Hazard, MD;  Location: [facility];  Service: Urology;  Laterality: N/A;  . Flexible sigmoidoscopy  01/07/2012    Procedure: FLEXIBLE SIGMOIDOSCOPY;  Surgeon: Ladene Artist, MD,FACG;  Location: [facility];  Service: Endoscopy;  Laterality: N/A;  . Sigmoidectomy  2004    colovesical fistula  . Cholecystectomy N/A  06/16/2014    Procedure: LAPAROSCOPIC CHOLECYSTECTOMY;  Surgeon: Greer Pickerel, MD;  Location: [facility];  Service: General;  Laterality: N/A;    Family History  Problem Relation Age of Onset  . Emphysema Brother   . Heart disease Mother   . Cancer Daughter     breast    No Known Allergies  Current Outpatient Prescriptions on File Prior to Visit  Medication Sig Dispense Refill  . aspirin 81 MG tablet Take 1 tablet (81 mg total) by mouth every morning. Stop for 1 week and then resume (Patient taking differently: Take 81 mg by mouth every morning. )    . atorvastatin  (LIPITOR) 40 MG tablet Take 40 mg by mouth every morning.     . celecoxib (CELEBREX) 200 MG capsule Take 1 capsule (200 mg total) by mouth every evening. Hold for the next 5 days and then resume    . cholecalciferol (VITAMIN D) 1000 UNITS tablet Take 2,000 Units by mouth 2 (two) times daily.     . metFORMIN (GLUCOPHAGE) 500 MG tablet Take 1 tablet (500 mg total) by mouth 3 (three) times daily. 90 tablet 3  . misoprostol (CYTOTEC) 100 MCG tablet TAKE ONE TABLET BY MOUTH IN THE MORNING, THEN TAKE ONE TABLET IN THE EVENING, THEN TAKE TWO TABLETS AT BEDTIME 120 tablet 3  . Multiple Vitamins-Minerals (CENTRUM) tablet Take 1 tablet by mouth every morning.     Marland Kitchen PARoxetine (PAXIL) 20 MG tablet Take 20 mg by mouth every morning.      . vitamin C (ASCORBIC ACID) 500 MG tablet Take 500 mg by mouth every morning.     Marland Kitchen albuterol (PROVENTIL HFA;VENTOLIN HFA) 108 (90 BASE) MCG/ACT inhaler Inhale 2 puffs into the lungs every 6 (six) hours as needed for wheezing or shortness of breath. 1 Inhaler 2  . enalapril (VASOTEC) 2.5 MG tablet Take 2.5 mg by mouth every morning. 06/13/14 STATES NOT TAKING AT PRESENT    . Indacaterol Maleate (ARCAPTA NEOHALER) 75 MCG CAPS Place 1 capsule into inhaler and inhale every morning. (Patient not taking: Reported on 06/21/2014) 90 capsule 1  . OVER THE COUNTER MEDICATION Place 1-2 drops into both eyes daily as needed (dry red eyes.).    Marland Kitchen UNABLE TO FIND CPAP     No current facility-administered medications on file prior to visit.    BP 130/66 mmHg  Pulse 107  Temp(Src) 97.9 F (36.6 C) (Oral)  Ht 6' (1.829 m)  Wt 245 lb 3.2 oz (111.222 kg)  BMI 33.25 kg/m2  SpO2 93%       Objective:   Physical Exam   General- No acute distress. Pleasant patient. Neck- Full range of motion, no jvd Lungs- Clear, even and unlabored. Heart- regular rate and rhythm. Neurologic- CNII- XII grossly intact.  Abdomen- protuberunt abdomen. Soft, non-tender, +bs, no rebound or guarding and  no organomegaly. Trochar scar sites look clean only scabbing present. No dc. Faint diffuse yellow brusiing of abdomen. Small portion of abdomen looks bluish bruised  Back- no cva pain.      Assessment & Plan:

## 2014-06-28 LAB — CBC WITH DIFFERENTIAL/PLATELET
BASOS ABS: 0.2 10*3/uL — AB (ref 0.0–0.1)
BASOS PCT: 1.6 % (ref 0.0–3.0)
Eosinophils Absolute: 1 10*3/uL — ABNORMAL HIGH (ref 0.0–0.7)
Eosinophils Relative: 9.8 % — ABNORMAL HIGH (ref 0.0–5.0)
HEMATOCRIT: 42.9 % (ref 39.0–52.0)
HEMOGLOBIN: 14 g/dL (ref 13.0–17.0)
LYMPHS PCT: 30.2 % (ref 12.0–46.0)
Lymphs Abs: 3.2 10*3/uL (ref 0.7–4.0)
MCHC: 32.7 g/dL (ref 30.0–36.0)
MCV: 95.3 fl (ref 78.0–100.0)
MONO ABS: 0.8 10*3/uL (ref 0.1–1.0)
MONOS PCT: 7.4 % (ref 3.0–12.0)
NEUTROS ABS: 5.4 10*3/uL (ref 1.4–7.7)
NEUTROS PCT: 51 % (ref 43.0–77.0)
Platelets: 292 10*3/uL (ref 150.0–400.0)
RBC: 4.51 Mil/uL (ref 4.22–5.81)
RDW: 15.5 % (ref 11.5–15.5)
WBC: 10.6 10*3/uL — AB (ref 4.0–10.5)

## 2014-06-28 LAB — HEMOGLOBIN A1C: Hgb A1c MFr Bld: 6 % (ref 4.6–6.5)

## 2014-06-29 NOTE — Discharge Summary (Signed)
Physician Discharge Summary  Lance Powell UVO:[id number] DOB: 1929/07/31 DOA: 06/16/2014  PCP: [name], PA-C  Admit date: 06/16/2014 Discharge date: 06/19/2014  Recommendations for Outpatient Follow-up:    Follow-up Information    Follow up with [name], MD On 07/07/2014.   Specialty:  General Surgery   Why:  10:15 AM (arrive 10am) , For wound re-check   Contact information:   [address] [place] [address] [phone]       Follow up with [name], MD. Call in 3 weeks.   Specialty:  General Surgery   Contact information:   [address] [place] [address] [phone]      Discharge Diagnoses:  Active Problems:   Obstructive sleep apnea   COPD (chronic obstructive pulmonary disease)   Hypertension   Diabetes mellitus type II, controlled   Chronic cholecystitis   Surgical Procedure: Laparoscopic cholecystectomy, lysis of adhesions  Discharge Condition: good Disposition: home  Diet recommendation: cardiac  Filed Weights   06/16/14 1032  Weight: 109.77 kg (242 lb)    History of present illness:  The patient is a 81 year old male who presents for evaluation of gallbladder disease. He comes in for hospital follow-up. I initially met him in the hospital in early February for acute cholecystitis with calculus as well as sigmoid diverticulitis. It appeared that he initially developed some sigmoid diverticulitis which was getting better and then developed right upper quadrant pain which prompted him to come to the emergency room. He did have evidence of sigmoid diverticulitis on his CT along with acute cholecystitis with calculus. He underwent percutaneous drainage of his gallbladder on February 7. He has numerous comorbidities including diabetes mellitus, COPD, obstructive sleep apnea on CPAP, hypertension. He was discharged on February 9. He states he is doing well. He denies any abdominal pain. He denies any  fevers or chills. He denies any nausea or vomiting. His drain is putting out around 100 cc a day. He denies any lightheadedness or dizziness. He saw the pulmonologist on March 8. His daughter is staying with him temporarily. He has a son who is in medical school. His son also is a Conservator, museum/gallery. He was felt to be at moderate risk for postoperative pulmonary complications when seen by pulmonary preoperatively  Hospital Course:  He did well. Initially kept in Step down unit overnight and then transferred to telemetry on POD 1. CPAP used at night. He did require some in & out catherization - pt does self cath at home. PT consult obtained. His pain gradually improved. On POD 3, he was doing well and was felt stable for discharge. He was tolerating a diet, his exam was stable, his vitals were wnl. He was discharged by one of my partners. i had discussed dc instructions with the pt before leaving and the need for him to see his PCP ideally within 1 week of discharge.    Discharge Instructions      Discharge Instructions    Discharge patient    Complete by:  As directed             Medication List    STOP taking these medications        clonazePAM 0.25 MG disintegrating tablet  Commonly known as:  KLONOPIN     naproxen sodium 220 MG tablet  Commonly known as:  ANAPROX     pantoprazole 40 MG tablet  Commonly known as:  PROTONIX  psyllium 0.52 G capsule  Commonly known as:  REGULOID     Tiotropium Bromide-Olodaterol 2.5-2.5 MCG/ACT Aers      TAKE these medications        albuterol 108 (90 BASE) MCG/ACT inhaler  Commonly known as:  PROVENTIL HFA;VENTOLIN HFA  Inhale 2 puffs into the lungs every 6 (six) hours as needed for wheezing or shortness of breath.     aspirin 81 MG tablet  Take 1 tablet (81 mg total) by mouth every morning. Stop for 1 week and then resume     atorvastatin 40 MG tablet  Commonly known as:  LIPITOR  Take 40 mg by mouth every morning.      celecoxib 200 MG capsule  Commonly known as:  CELEBREX  Take 1 capsule (200 mg total) by mouth every evening. Hold for the next 5 days and then resume     CENTRUM tablet  Take 1 tablet by mouth every morning.     cholecalciferol 1000 UNITS tablet  Commonly known as:  VITAMIN D  Take 2,000 Units by mouth 2 (two) times daily.     enalapril 2.5 MG tablet  Commonly known as:  VASOTEC  Take 2.5 mg by mouth every morning. 06/13/14 STATES NOT TAKING AT PRESENT     Indacaterol Maleate 75 MCG Caps  Commonly known as:  ARCAPTA NEOHALER  Place 1 capsule into inhaler and inhale every morning.     metFORMIN 500 MG tablet  Commonly known as:  GLUCOPHAGE  Take 1 tablet (500 mg total) by mouth 3 (three) times daily.     misoprostol 100 MCG tablet  Commonly known as:  CYTOTEC  TAKE ONE TABLET BY MOUTH IN THE MORNING, THEN TAKE ONE TABLET IN THE EVENING, THEN TAKE TWO TABLETS AT BEDTIME     OVER THE COUNTER MEDICATION  Place 1-2 drops into both eyes daily as needed (dry red eyes.).     PARoxetine 20 MG tablet  Commonly known as:  PAXIL  Take 20 mg by mouth every morning.     UNABLE TO FIND  CPAP     vitamin C 500 MG tablet  Commonly known as:  ASCORBIC ACID  Take 500 mg by mouth every morning.       Follow-up Information    Follow up with [name], MD On 07/07/2014.   Specialty:  General Surgery   Why:  10:15 AM (arrive 10am) , For wound re-check   Contact information:   [address] [place] [address] [phone]       Follow up with [name], MD. Call in 3 weeks.   Specialty:  General Surgery   Contact information:   [place] [address] [phone]        The results of significant diagnostics from this hospitalization (including imaging, microbiology, ancillary and laboratory) are listed below for reference.    Significant Diagnostic Studies: No results found.  Microbiology: No results found for this or any  previous visit (from the past 240 hour(s)).   Labs: BMP Latest Ref Rng 06/18/2014 06/17/2014 06/13/2014  Glucose 65 - 99 mg/dL 150(H) 127(H) 127(H)  BUN 6 - 20 mg/dL _0 Creatinine 0.61 - 1.24 mg/dL 0.94 1.00 1.09  Sodium 135 - 145 mmol/L 135 139 143  Potassium 3.5 - 5.1 mmol/L 3.9 4.3 5.1  Chloride 101 - 111 mmol/L 103 103 105  CO2 22 - 32 mmol/L _1 Calcium  8.9 - 10.3 mg/dL 9.1 9.2 10.7(H)    Hepatic Function Latest Ref Rng 06/13/2014 06/01/2014 03/08/2014  Total Protein 6.5 - 8.1 g/dL 7.6 6.8 7.2  Albumin 3.5 - 5.0 g/dL 4.2 3.8 3.6  AST 15 - 41 U/L _0 ALT 17 - 63 U/L 23 21 32  Alk Phosphatase 38 - 126 U/L 83 66 71  Total Bilirubin 0.3 - 1.2 mg/dL 0.7 0.5 0.4    CBC Latest Ref Rng 06/27/2014 06/18/2014 06/17/2014  WBC 4.0 - 10.5 K/uL 10.6(H) 9.7 10.9(H)  Hemoglobin 13.0 - 17.0 g/dL 14.0 12.3(L) 14.0  Hematocrit 39.0 - 52.0 % 42.9 38.8(L) 44.3  Platelets 150.0 - 400.0 K/uL 292.0 143(L) 162    Recent Labs Lab 06/27/14 1624  WBC 10.6*  NEUTROABS 5.4  HGB 14.0  HCT 42.9  MCV 95.3  PLT 292.0   Active Problems:   Obstructive sleep apnea   COPD (chronic obstructive pulmonary disease)   Hypertension   Diabetes mellitus type II, controlled   Chronic cholecystitis   Time coordinating discharge: 10 minutes  Signed:  [name], MD [facility], [place] 06/29/2014, 4:01 PM

## 2014-07-27 ENCOUNTER — Ambulatory Visit (HOSPITAL_BASED_OUTPATIENT_CLINIC_OR_DEPARTMENT_OTHER)
Admission: RE | Admit: 2014-07-27 | Discharge: 2014-07-27 | Disposition: A | Discharge status: Home / Self Care | Payer: Medicare Other | Source: Ambulatory Visit | Attending: Medical | Admitting: Medical

## 2014-07-27 ENCOUNTER — Encounter: Payer: Self-pay | Admitting: Medical

## 2014-07-27 ENCOUNTER — Ambulatory Visit (INDEPENDENT_AMBULATORY_CARE_PROVIDER_SITE_OTHER): Payer: Medicare Other | Admitting: Medical

## 2014-07-27 VITALS — BP 126/66 | HR 113 | Temp 98.0°F | Ht 72.0 in | Wt 243.6 lb

## 2014-07-27 DIAGNOSIS — T148 Other injury of unspecified body region: Secondary | ICD-10-CM

## 2014-07-27 DIAGNOSIS — T148XXA Other injury of unspecified body region, initial encounter: Secondary | ICD-10-CM | POA: Insufficient documentation

## 2014-07-27 DIAGNOSIS — M13852 Other specified arthritis, left hip: Secondary | ICD-10-CM | POA: Insufficient documentation

## 2014-07-27 DIAGNOSIS — M13851 Other specified arthritis, right hip: Secondary | ICD-10-CM | POA: Diagnosis not present

## 2014-07-27 DIAGNOSIS — M25551 Pain in right hip: Secondary | ICD-10-CM

## 2014-07-27 DIAGNOSIS — M25559 Pain in unspecified hip: Secondary | ICD-10-CM | POA: Insufficient documentation

## 2014-07-27 DIAGNOSIS — M169 Osteoarthritis of hip, unspecified: Secondary | ICD-10-CM | POA: Insufficient documentation

## 2014-07-27 NOTE — Progress Notes (Signed)
Subjective:    Patient ID: Lance Powell, male    DOB: 06/07/1929, 81 y.o.   MRN: [id number]  HPI  Pt in with some rt hip  area pain intermittently. Hurts at time in bed lying down on his side at times. When he  abducts his rt lower leg pain will increase. Sometime when he bends over to to attach dog leash will get transient pain.  Pt fell over from squatted position. His dog leash was tangled up on stake. He described tumping over on concreted.    Review of Systems  Constitutional: Negative for fever, chills, diaphoresis, activity change and fatigue.  Respiratory: Negative for cough, chest tightness and shortness of breath.   Cardiovascular: Negative for chest pain, palpitations and leg swelling.  Gastrointestinal: Negative for nausea, vomiting and abdominal pain.  Musculoskeletal: Negative for neck pain and neck stiffness.       Rt hip region pain.  Neurological: Negative for dizziness, tremors, seizures, syncope, facial asymmetry, speech difficulty, weakness, light-headedness, numbness and headaches.  Psychiatric/Behavioral: Negative for behavioral problems, confusion and agitation. The patient is not nervous/anxious.     Past Medical History  Diagnosis Date  . Osteoarthritis   . Rheumatoid arthritis(714.0)   . Hyperlipemia   . Diabetes mellitus   . COPD (chronic obstructive pulmonary disease)   . Sleep apnea     cpap  . Hypertension   . Depression 03/08/2014  . GERD (gastroesophageal reflux disease) 03/08/2014  . OSA (obstructive sleep apnea)   . History of blood transfusion     History   Social History  . Marital Status: Married    Spouse Name: N/A  . Number of Children: 6  . Years of Education: N/A   Occupational History  . Retired     Chief Financial Officer   Social History Main Topics  . Smoking status: Former Smoker -- 4.00 packs/day for 30 years    Types: Cigarettes    Quit date: 01/21/1978  . Smokeless tobacco: Never Used  . Alcohol Use: No  . Drug Use: No  .  Sexual Activity: Not on file   Other Topics Concern  . Not on file   Social History Narrative    Past Surgical History  Procedure Laterality Date  . Appendectomy  1969  . Nissen fundoplication    . Hernia repair  1990    right and left inguinal  . Vasectomy  1972  . Tonsillectomy  1937  . Transurethral resection of prostate  01/23/2011    Procedure: TRANSURETHRAL RESECTION OF THE PROSTATE WITH GYRUS INSTRUMENTS;  Surgeon: Molli Hazard, MD;  Location: [facility];  Service: Urology;  Laterality: N/A;  . Cystoscopy  01/23/2011    Procedure: CYSTOSCOPY;  Surgeon: Molli Hazard, MD;  Location: [facility];  Service: Urology;  Laterality: N/A;  . Flexible sigmoidoscopy  01/07/2012    Procedure: FLEXIBLE SIGMOIDOSCOPY;  Surgeon: Ladene Artist, MD,FACG;  Location: [facility];  Service: Endoscopy;  Laterality: N/A;  . Sigmoidectomy  2004    colovesical fistula  . Cholecystectomy N/A 06/16/2014    Procedure: LAPAROSCOPIC CHOLECYSTECTOMY;  Surgeon: Greer Pickerel, MD;  Location: [facility];  Service: General;  Laterality: N/A;    Family History  Problem Relation Age of Onset  . Emphysema Brother   . Heart disease Mother   . Cancer Daughter     breast    No Known Allergies  Current Outpatient Prescriptions on File Prior to Visit  Medication Sig Dispense  Refill  . albuterol (PROVENTIL HFA;VENTOLIN HFA) 108 (90 BASE) MCG/ACT inhaler Inhale 2 puffs into the lungs every 6 (six) hours as needed for wheezing or shortness of breath. 1 Inhaler 2  . aspirin 81 MG tablet Take 1 tablet (81 mg total) by mouth every morning. Stop for 1 week and then resume (Patient taking differently: Take 81 mg by mouth every morning. )    . atorvastatin (LIPITOR) 40 MG tablet Take 40 mg by mouth every morning.     . celecoxib (CELEBREX) 200 MG capsule Take 1 capsule (200 mg total) by mouth every evening. Hold for the next 5 days and then resume    . cholecalciferol  (VITAMIN D) 1000 UNITS tablet Take 2,000 Units by mouth 2 (two) times daily.     . clonazePAM (KLONOPIN) 0.25 MG disintegrating tablet 1 tab po q hs prn insomnia or anxiety 10 tablet 2  . enalapril (VASOTEC) 2.5 MG tablet Take 2.5 mg by mouth every morning. 06/13/14 STATES NOT TAKING AT PRESENT    . Indacaterol Maleate (ARCAPTA NEOHALER) 75 MCG CAPS Place 1 capsule into inhaler and inhale every morning. 90 capsule 1  . metFORMIN (GLUCOPHAGE) 500 MG tablet Take 1 tablet (500 mg total) by mouth 3 (three) times daily. 90 tablet 3  . misoprostol (CYTOTEC) 100 MCG tablet TAKE ONE TABLET BY MOUTH IN THE MORNING, THEN TAKE ONE TABLET IN THE EVENING, THEN TAKE TWO TABLETS AT BEDTIME 120 tablet 3  . Multiple Vitamins-Minerals (CENTRUM) tablet Take 1 tablet by mouth every morning.     Marland Kitchen OVER THE COUNTER MEDICATION Place 1-2 drops into both eyes daily as needed (dry red eyes.).    Marland Kitchen PARoxetine (PAXIL) 20 MG tablet Take 20 mg by mouth every morning.      Marland Kitchen UNABLE TO FIND CPAP    . vitamin C (ASCORBIC ACID) 500 MG tablet Take 500 mg by mouth every morning.      No current facility-administered medications on file prior to visit.    BP 126/66 mmHg  Pulse 113  Temp(Src) 98 F (36.7 C) (Oral)  Ht 6' (1.829 m)  Wt 243 lb 9.6 oz (110.496 kg)  BMI 33.03 kg/m2  SpO2 97%       Objective:   Physical Exam  General Mental Status- Alert. General Appearance- Not in acute distress.   Skin General: Color- Normal Color. Moisture- Normal Moisture.   Chest and Lung Exam Auscultation: Breath Sounds:-Normal. CTA.  Cardiovascular Auscultation:Rythm- Regular,Rate and Rythm Murmurs & Other Heart Sounds:Auscultation of the heart reveals- No Murmurs.  Abdomen Inspection:-Inspeection Normal. Palpation/Percussion:Note:No mass. Palpation and Percussion of the abdomen reveal- Non Tender, Non Distended + BS, no rebound or guarding.  Neurologic Cranial Nerve exam:- CN III-XII intact(No nystagmus), symmetric  smile. Strength:- 5/5 equal and symmetric strength both upper and lower extremities.  Rt hip- mild direct pain over trochanter area. On abduction and adduction no pain presently. No pain on rom.   Rt knee- no pain on rom. No crepitus. Faint abrasion lateral aspect of calf.  Rt forearm-  Slight abrasion posterior aspect. Rt elbow- no pain on rom or palpation. Rt wrist- no pain on palpation.      Assessment & Plan:

## 2014-07-27 NOTE — Assessment & Plan Note (Addendum)
Rt hip pain. Will get xray of hip. Then decide if refer to orthopedist. May have degenerative changes or bursitis.

## 2014-07-27 NOTE — Progress Notes (Signed)
Pre visit review using our clinic review tool, if applicable. No additional management support is needed unless otherwise documented below in the visit note. 

## 2014-07-27 NOTE — Patient Instructions (Addendum)
Hip pain Rt hip pain. Will get xray of hip. Then decide if refer to orthopedist. May have degenerative changes or bursitis.  Abrasion Apply neosporin to area twice daily. If any indication of infection notify us.    Reviewed xray and did decide to refer to ortho Continue celebrex.  Follow up in 2 wks or as needed

## 2014-07-27 NOTE — Assessment & Plan Note (Signed)
Apply neosporin to area twice daily. If any indication of infection notify us.

## 2014-09-07 ENCOUNTER — Ambulatory Visit: Payer: Medicare Other | Admitting: Medical

## 2014-09-08 ENCOUNTER — Ambulatory Visit (INDEPENDENT_AMBULATORY_CARE_PROVIDER_SITE_OTHER): Payer: Medicare Other | Admitting: Medical

## 2014-09-08 ENCOUNTER — Encounter: Payer: Self-pay | Admitting: Medical

## 2014-09-08 VITALS — BP 122/80 | HR 78 | Temp 98.3°F | Resp 16 | Ht 72.0 in | Wt 247.4 lb

## 2014-09-08 DIAGNOSIS — J449 Chronic obstructive pulmonary disease, unspecified: Secondary | ICD-10-CM | POA: Diagnosis not present

## 2014-09-08 DIAGNOSIS — M25551 Pain in right hip: Secondary | ICD-10-CM

## 2014-09-08 DIAGNOSIS — E785 Hyperlipidemia, unspecified: Secondary | ICD-10-CM | POA: Diagnosis not present

## 2014-09-08 DIAGNOSIS — R252 Cramp and spasm: Secondary | ICD-10-CM

## 2014-09-08 DIAGNOSIS — E119 Type 2 diabetes mellitus without complications: Secondary | ICD-10-CM

## 2014-09-08 DIAGNOSIS — I10 Essential (primary) hypertension: Secondary | ICD-10-CM

## 2014-09-08 NOTE — Patient Instructions (Signed)
Right hip pain resolved.  Diabetes well controlled. Continue metformin and low sugar diet.  Copd stable. Continue oxygen. Use albuterol if needed.  Bp well controlled continue current bp meds.  Lipids will be checked in early September.  Regarding your occasional cramps would recpommend cmp and mg now. But you want to wait until sept labs. That is ok but if cramps worsen then will do before. Can eat banana every other day presently.  Follow up date to be determined after lab review.

## 2014-09-08 NOTE — Progress Notes (Signed)
Subjective:    Patient ID: Lance Powell, male    DOB: April 21, 1929, 81 y.o.   MRN: [id number]  HPI   Pt is doing well with his rt hip pain. Pt has seen his orthopedist. On last time I saw him and thought bursitis. Second injection did help. Bilateral hip arthritis of both hips. His pain is resolved now. So most of pain likely bursitis.  Pt has diabetic. On metformin. No side effects. When he checks bs 115-120.   Pt is on 02 daily. He is not having to use albuterol. Breathing is stable.  Pt hyperlipidemia. Pt on lipitor. No myalgias.  Pt bp is well controlled. No neurologic or cardiac signs or symptoms.  Pt mentions occasional transient cramps in calves.       Review of Systems  Constitutional: Negative for fever, chills and fatigue.  Respiratory: Negative for cough, chest tightness, shortness of breath and wheezing.        Breathing is stable.  Cardiovascular: Negative for chest pain and palpitations.  Gastrointestinal: Negative for abdominal pain.  Endocrine: Negative for polydipsia, polyphagia and polyuria.  Musculoskeletal: Negative for back pain.       Rt hip pain resolved.  Skin: Negative for rash.  Neurological: Negative for dizziness, weakness, light-headedness and headaches.  Hematological: Negative for adenopathy. Does not bruise/bleed easily.  Psychiatric/Behavioral: Negative for behavioral problems and confusion.   Past Medical History  Diagnosis Date  . Osteoarthritis   . Rheumatoid arthritis(714.0)   . Hyperlipemia   . Diabetes mellitus   . COPD (chronic obstructive pulmonary disease)   . Sleep apnea     cpap  . Hypertension   . Depression 03/08/2014  . GERD (gastroesophageal reflux disease) 03/08/2014  . OSA (obstructive sleep apnea)   . History of blood transfusion     Social History   Social History  . Marital Status: Married    Spouse Name: N/A  . Number of Children: 6  . Years of Education: N/A   Occupational History  . Retired    Chief Financial Officer   Social History Main Topics  . Smoking status: Former Smoker -- 4.00 packs/day for 30 years    Types: Cigarettes    Quit date: 01/21/1978  . Smokeless tobacco: Never Used  . Alcohol Use: No  . Drug Use: No  . Sexual Activity: Not on file   Other Topics Concern  . Not on file   Social History Narrative    Past Surgical History  Procedure Laterality Date  . Appendectomy  1969  . Nissen fundoplication    . Hernia repair  1990    right and left inguinal  . Vasectomy  1972  . Tonsillectomy  1937  . Transurethral resection of prostate  01/23/2011    Procedure: TRANSURETHRAL RESECTION OF THE PROSTATE WITH GYRUS INSTRUMENTS;  Surgeon: Molli Hazard, MD;  Location[facility];  Service: Urology;  Laterality: N/A;  . Cystoscopy  01/23/2011    Procedure: CYSTOSCOPY;  Surgeon: Molli Hazard, MD;  Location: [facility];  Service: Urology;  Laterality: N/A;  . Flexible sigmoidoscopy  01/07/2012    Procedure: FLEXIBLE SIGMOIDOSCOPY;  Surgeon: Ladene Artist, MD,FACG;  Location: [facility];  Service: Endoscopy;  Laterality: N/A;  . Sigmoidectomy  2004    colovesical fistula  . Cholecystectomy N/A 06/16/2014    Procedure: LAPAROSCOPIC CHOLECYSTECTOMY;  Surgeon: Greer Pickerel, MD;  Location: [facility];  Service: General;  Laterality: N/A;    Family History  Problem Relation Age of Onset  . Emphysema Brother   . Heart disease Mother   . Cancer Daughter     breast    No Known Allergies  Current Outpatient Prescriptions on File Prior to Visit  Medication Sig Dispense Refill  . albuterol (PROVENTIL HFA;VENTOLIN HFA) 108 (90 BASE) MCG/ACT inhaler Inhale 2 puffs into the lungs every 6 (six) hours as needed for wheezing or shortness of breath. 1 Inhaler 2  . aspirin 81 MG tablet Take 1 tablet (81 mg total) by mouth every morning. Stop for 1 week and then resume (Patient taking differently: Take 81 mg by mouth every morning. )    .  atorvastatin (LIPITOR) 40 MG tablet Take 40 mg by mouth every morning.     . celecoxib (CELEBREX) 200 MG capsule Take 1 capsule (200 mg total) by mouth every evening. Hold for the next 5 days and then resume    . cholecalciferol (VITAMIN D) 1000 UNITS tablet Take 2,000 Units by mouth 2 (two) times daily.     . clonazePAM (KLONOPIN) 0.25 MG disintegrating tablet 1 tab po q hs prn insomnia or anxiety 10 tablet 2  . enalapril (VASOTEC) 2.5 MG tablet Take 2.5 mg by mouth every morning. 06/13/14 STATES NOT TAKING AT PRESENT    . Indacaterol Maleate (ARCAPTA NEOHALER) 75 MCG CAPS Place 1 capsule into inhaler and inhale every morning. 90 capsule 1  . metFORMIN (GLUCOPHAGE) 500 MG tablet Take 1 tablet (500 mg total) by mouth 3 (three) times daily. 90 tablet 3  . misoprostol (CYTOTEC) 100 MCG tablet TAKE ONE TABLET BY MOUTH IN THE MORNING, THEN TAKE ONE TABLET IN THE EVENING, THEN TAKE TWO TABLETS AT BEDTIME 120 tablet 3  . Multiple Vitamins-Minerals (CENTRUM) tablet Take 1 tablet by mouth every morning.     Marland Kitchen OVER THE COUNTER MEDICATION Place 1-2 drops into both eyes daily as needed (dry red eyes.).    Marland Kitchen PARoxetine (PAXIL) 20 MG tablet Take 20 mg by mouth every morning.      Marland Kitchen UNABLE TO FIND CPAP    . vitamin C (ASCORBIC ACID) 500 MG tablet Take 500 mg by mouth every morning.      No current facility-administered medications on file prior to visit.    BP 122/80 mmHg  Pulse 78  Temp(Src) 98.3 F (36.8 C) (Oral)  Resp 16  Ht 6' (1.829 m)  Wt 247 lb 6.4 oz (112.22 kg)  BMI 33.55 kg/m2  SpO2 97%       Objective:   Physical Exam  General Mental Status- Alert. General Appearance- Not in acute distress.   Skin General: Color- Normal Color. Moisture- Normal Moisture.  Neck Carotid Arteries- Normal color. Moisture- Normal Moisture. No carotid bruits. No JVD.  Chest and Lung Exam Auscultation: Breath Sounds:-Normal. CTA.  Cardiovascular Auscultation:Rythm- Regular, Rate and  Rhythm. Murmurs & Other Heart Sounds:Auscultation of the heart reveals- No Murmurs.  Abdomen Inspection:-Inspeection Normal. Palpation/Percussion:Note:No mass. Palpation and Percussion of the abdomen reveal- Non Tender, Non Distended + BS, no rebound or guarding.    Neurologic Cranial Nerve exam:- CN III-XII intact(No nystagmus), symmetric smile. Drift Test:- No drift. Romberg Exam:- Negative.  Heal to Toe Gait exam:-Normal. Finger to Nose:- Normal/Intact Strength:- 5/5 equal and symmetric strength both upper and lower extremities.  Lower ext- no pedal edema. Negative homans signs.      Assessment & Plan:  Right hip pain resolved.  Diabetes well controlled. Continue metformin and low sugar diet.  Copd stable. Continue  oxygen. Use albuterol if needed.  Bp well controlled continue current bp meds.  Lipids will be checked in early September.  Regarding your occasional cramps would recpommend cmp and mg now. But you want to wait until sept labs. That is ok but if cramps worsen then will do before. Can eat banana every other day presently.  Follow up date to be determined after lab review.

## 2014-09-08 NOTE — Progress Notes (Signed)
Pre visit review using our clinic review tool, if applicable. No additional management support is needed unless otherwise documented below in the visit note. 

## 2014-10-17 ENCOUNTER — Telehealth: Payer: Self-pay | Admitting: Medical

## 2014-10-17 NOTE — Telephone Encounter (Signed)
Pharmacy: [facility]  Reason for call: Pt needing atorvastatin 40mg  filled. Takes 1/day. Has 9 days. Last filled by previous provider. Pt needs sent for 90 day supply please.

## 2014-10-17 NOTE — Telephone Encounter (Signed)
Left message for pt to call back about medication.  Pt was last seen 09/08/14.  Pt was supposed to follow up with lab appt around the 22nd.  Please advise on refill.

## 2014-10-18 MED ORDER — ATORVASTATIN CALCIUM 40 MG PO TABS: 40.0000 mg | ORAL_TABLET | Freq: Every morning | ORAL | Status: DC

## 2014-10-18 NOTE — Addendum Note (Signed)
Addended by: Tasia Catchings on: 10/18/2014 03:37 PM   Modules accepted: Orders

## 2014-10-18 NOTE — Telephone Encounter (Signed)
Per ES will send in 30 day supply and pt will come in for a medication check.

## 2014-10-18 NOTE — Telephone Encounter (Signed)
This can be addressed by PCP. Is not urgent.

## 2014-10-26 ENCOUNTER — Other Ambulatory Visit: Payer: Self-pay | Admitting: Medical

## 2014-10-26 ENCOUNTER — Ambulatory Visit: Payer: Medicare Other

## 2014-10-26 NOTE — Telephone Encounter (Signed)
Last filled 06/09/14 Last OV 09/08/14.  Please advise on refill.

## 2014-10-26 NOTE — Telephone Encounter (Signed)
Refilled the misoprostol.

## 2014-10-27 ENCOUNTER — Other Ambulatory Visit: Payer: Self-pay

## 2014-10-27 MED ORDER — PAROXETINE HCL 20 MG PO TABS: 20.0000 mg | ORAL_TABLET | ORAL | Status: DC

## 2014-10-27 NOTE — Telephone Encounter (Signed)
Pt last seen 09/08/14 Does not look like you filled this before.  Please advise on refill.

## 2014-11-02 ENCOUNTER — Ambulatory Visit (HOSPITAL_BASED_OUTPATIENT_CLINIC_OR_DEPARTMENT_OTHER)
Admission: RE | Admit: 2014-11-02 | Discharge: 2014-11-02 | Disposition: A | Discharge status: Home / Self Care | Payer: Medicare Other | Source: Ambulatory Visit | Attending: Medical | Admitting: Medical

## 2014-11-02 ENCOUNTER — Ambulatory Visit (INDEPENDENT_AMBULATORY_CARE_PROVIDER_SITE_OTHER): Payer: Medicare Other | Admitting: Medical

## 2014-11-02 ENCOUNTER — Encounter: Payer: Self-pay | Admitting: Medical

## 2014-11-02 ENCOUNTER — Ambulatory Visit: Payer: Medicare Other | Admitting: Pulmonary Disease

## 2014-11-02 VITALS — BP 120/70 | HR 116 | Temp 98.1°F | Ht 72.0 in | Wt 255.6 lb

## 2014-11-02 DIAGNOSIS — J208 Acute bronchitis due to other specified organisms: Secondary | ICD-10-CM

## 2014-11-02 DIAGNOSIS — R062 Wheezing: Secondary | ICD-10-CM | POA: Diagnosis not present

## 2014-11-02 DIAGNOSIS — R05 Cough: Secondary | ICD-10-CM | POA: Diagnosis present

## 2014-11-02 DIAGNOSIS — D72829 Elevated white blood cell count, unspecified: Secondary | ICD-10-CM | POA: Diagnosis not present

## 2014-11-02 DIAGNOSIS — R918 Other nonspecific abnormal finding of lung field: Secondary | ICD-10-CM | POA: Diagnosis not present

## 2014-11-02 DIAGNOSIS — J029 Acute pharyngitis, unspecified: Secondary | ICD-10-CM | POA: Diagnosis not present

## 2014-11-02 LAB — POCT RAPID STREP A (OFFICE): Rapid Strep A Screen: NEGATIVE

## 2014-11-02 MED ORDER — BENZONATATE 100 MG PO CAPS: 100.0000 mg | ORAL_CAPSULE | Freq: Three times a day (TID) | ORAL | Status: DC | PRN

## 2014-11-02 MED ORDER — IPRATROPIUM BROMIDE HFA 17 MCG/ACT IN AERS: INHALATION_SPRAY | RESPIRATORY_TRACT | Status: DC

## 2014-11-02 MED ORDER — CEFTRIAXONE SODIUM 1 G IJ SOLR
1.0000 g | Freq: Once | INTRAMUSCULAR | Status: AC
  Administered 2014-11-02: 1 g via INTRAMUSCULAR

## 2014-11-02 MED ORDER — DOXYCYCLINE HYCLATE 100 MG PO TABS: 100.0000 mg | ORAL_TABLET | Freq: Two times a day (BID) | ORAL | Status: DC

## 2014-11-02 NOTE — Progress Notes (Signed)
Subjective:    Patient ID: Lance Powell, male    DOB: August 07, 1929, 81 y.o.   MRN: [id number]  HPI   Pt in for follow up.  Pt states wife has been sick. She had chest cold. Pt thinks he may have same illness. Pt states she was checked for possible pneumonia. She is getting better regarding her illness. Sounds like she had bronchitis.  Pt states he has had sinus congestion and chest congestion. He states has been sick for 4 wks but did not come in. Coughing up mucous in am daily. Some episodes intermittent mucous production during the day. Some fatigue but no fever or chills.  Pt has copd. He is on albuterol. Using at most twice a day. He is on oxygen.   Pt is coughing some at night.   Pt had some mild sorethroat. Rapid strep test was negative.   Review of Systems  Constitutional: Positive for fatigue. Negative for fever, chills, diaphoresis and activity change.  Respiratory: Positive for cough and wheezing. Negative for chest tightness and shortness of breath.        He is wheezing some at night.  Cardiovascular: Negative for chest pain, palpitations and leg swelling.  Gastrointestinal: Negative for nausea, vomiting and abdominal pain.  Musculoskeletal: Negative for back pain, neck pain and neck stiffness.  Neurological: Negative for dizziness, tremors, seizures, syncope, facial asymmetry, speech difficulty, weakness, light-headedness, numbness and headaches.  Psychiatric/Behavioral: Negative for behavioral problems, confusion and agitation. The patient is not nervous/anxious.    Past Medical History  Diagnosis Date  . Osteoarthritis   . Rheumatoid arthritis(714.0)   . Hyperlipemia   . Diabetes mellitus   . COPD (chronic obstructive pulmonary disease) ([place])   . Sleep apnea     cpap  . Hypertension   . Depression 03/08/2014  . GERD (gastroesophageal reflux disease) 03/08/2014  . OSA (obstructive sleep apnea)   . History of blood transfusion     Social History    Social History  . Marital Status: Married    Spouse Name: N/A  . Number of Children: 6  . Years of Education: N/A   Occupational History  . Retired     Chief Financial Officer   Social History Main Topics  . Smoking status: Former Smoker -- 4.00 packs/day for 30 years    Types: Cigarettes    Quit date: 01/21/1978  . Smokeless tobacco: Never Used  . Alcohol Use: No  . Drug Use: No  . Sexual Activity: Not on file   Other Topics Concern  . Not on file   Social History Narrative    Past Surgical History  Procedure Laterality Date  . Appendectomy  1969  . Nissen fundoplication    . Hernia repair  1990    right and left inguinal  . Vasectomy  1972  . Tonsillectomy  1937  . Transurethral resection of prostate  01/23/2011    Procedure: TRANSURETHRAL RESECTION OF THE PROSTATE WITH GYRUS INSTRUMENTS;  Surgeon: Molli Hazard, MD;  Location: [facility];  Service: Urology;  Laterality: N/A;  . Cystoscopy  01/23/2011    Procedure: CYSTOSCOPY;  Surgeon: Molli Hazard, MD;  Location: [facility];  Service: Urology;  Laterality: N/A;  . Flexible sigmoidoscopy  01/07/2012    Procedure: FLEXIBLE SIGMOIDOSCOPY;  Surgeon: Ladene Artist, MD,FACG;  Location: [facility];  Service: Endoscopy;  Laterality: N/A;  . Sigmoidectomy  2004    colovesical fistula  . Cholecystectomy N/A 06/16/2014  Procedure: LAPAROSCOPIC CHOLECYSTECTOMY;  Surgeon: Greer Pickerel, MD;  Location: [facility];  Service: General;  Laterality: N/A;    Family History  Problem Relation Age of Onset  . Emphysema Brother   . Heart disease Mother   . Cancer Daughter     breast    No Known Allergies  Current Outpatient Prescriptions on File Prior to Visit  Medication Sig Dispense Refill  . albuterol (PROVENTIL HFA;VENTOLIN HFA) 108 (90 BASE) MCG/ACT inhaler Inhale 2 puffs into the lungs every 6 (six) hours as needed for wheezing or shortness of breath. 1 Inhaler 2  . aspirin 81 MG tablet  Take 1 tablet (81 mg total) by mouth every morning. Stop for 1 week and then resume (Patient taking differently: Take 81 mg by mouth every morning. )    . atorvastatin (LIPITOR) 40 MG tablet Take 1 tablet (40 mg total) by mouth every morning. 30 tablet 0  . celecoxib (CELEBREX) 200 MG capsule Take 1 capsule (200 mg total) by mouth every evening. Hold for the next 5 days and then resume    . cholecalciferol (VITAMIN D) 1000 UNITS tablet Take 2,000 Units by mouth 2 (two) times daily.     . clonazePAM (KLONOPIN) 0.25 MG disintegrating tablet 1 tab po q hs prn insomnia or anxiety 10 tablet 2  . enalapril (VASOTEC) 2.5 MG tablet Take 2.5 mg by mouth every morning. 06/13/14 STATES NOT TAKING AT PRESENT    . Indacaterol Maleate (ARCAPTA NEOHALER) 75 MCG CAPS Place 1 capsule into inhaler and inhale every morning. 90 capsule 1  . metFORMIN (GLUCOPHAGE) 500 MG tablet Take 1 tablet (500 mg total) by mouth 3 (three) times daily. 90 tablet 3  . misoprostol (CYTOTEC) 100 MCG tablet TAKE ONE TABLET BY MOUTH IN THE MORNING THEN ONE TABLET IN THE EVENING THEN TWO TABLETS AT BEDTIME 120 tablet 3  . Multiple Vitamins-Minerals (CENTRUM) tablet Take 1 tablet by mouth every morning.     Marland Kitchen OVER THE COUNTER MEDICATION Place 1-2 drops into both eyes daily as needed (dry red eyes.).    Marland Kitchen PARoxetine (PAXIL) 20 MG tablet Take 1 tablet (20 mg total) by mouth every morning. 30 tablet 3  . UNABLE TO FIND CPAP    . vitamin C (ASCORBIC ACID) 500 MG tablet Take 500 mg by mouth every morning.      No current facility-administered medications on file prior to visit.    BP 120/70 mmHg  Pulse 116  Temp(Src) 98.1 F (36.7 C) (Oral)  Ht 6' (1.829 m)  Wt 255 lb 9.6 oz (115.939 kg)  BMI 34.66 kg/m2  SpO2 94%        Objective:   Physical Exam  General  Mental Status - Alert. General Appearance - Well groomed. Not in acute distress.  Skin Rashes- No Rashes.  HEENT Head- Normal. Ear Auditory Canal - Left- Normal.  Right - Normal.Tympanic Membrane- Left- Normal. Right- Normal. Eye Sclera/Conjunctiva- Left- Normal. Right- Normal. Nose & Sinuses Nasal Mucosa- Left-  Not boggy or Congested. Right-  Not  boggy or Congested. Faint maxillary sinus pressure Mouth & Throat Lips: Upper Lip- Normal: no dryness, cracking, pallor, cyanosis, or vesicular eruption. Lower Lip-Normal: no dryness, cracking, pallor, cyanosis or vesicular eruption. Buccal Mucosa- Bilateral- No Aphthous ulcers. Oropharynx- No Discharge or Erythema. Tonsils: Characteristics- Bilateral- No Erythema or Congestion. Size/Enlargement- Bilateral- No enlargement. Discharge- bilateral-None.  Neck Neck- Supple. No Masses.   Chest and Lung Exam Auscultation: Breath Sounds:- even and unlabored, but faint  bilateral upper lobe rhonchi.  Cardiovascular Auscultation:Rythm- Regular, rate and rhythm. Murmurs & Other Heart Sounds:Ausculatation of the heart reveal- No Murmurs.  Lymphatic Head & Neck General Head & Neck Lymphatics: Bilateral: Description- No Localized lymphadenopathy.       Assessment & Plan:  Your rapid strep test was negative.  For your bronchitis and possible sinusitis  will give rocephin 1 gram im and rx doxycycline antibiotic.  Rx benzonatate for cough.  For your wheezing and copd hx, rx atrovent. Inhaler and use albuterol as needed.  cxr and cbc today.  If your chest and blood work look good but you persist with some wheeze by Friday may give you brief low dose course of prednisone.  Follow up 7 days or as needed.  If you feel improved by Monday or Tuesday next week would recommend getting flu vaccine. Presently would hold off during acute illness.

## 2014-11-02 NOTE — Progress Notes (Signed)
Pre visit review using our clinic review tool, if applicable. No additional management support is needed unless otherwise documented below in the visit note. 

## 2014-11-02 NOTE — Patient Instructions (Signed)
Your rapid strep test was negative.  For your bronchitis and possible sinusitis  will give rocephin 1 gram im and rx doxycycline antibiotic.  Rx benzonatate for cough.  For your wheezing and copd hx, rx atrovent. Inhaler and use albuterol as needed.  cxr and cbc today.  If your chest and blood work look good but you persist with some wheeze by Friday may give you brief low dose course of prednisone.  Follow up 7 days or as needed.  If you feel improved by Monday or Tuesday next week would recommend getting flu vaccine. Presently would hold off during acute illness.

## 2014-11-03 LAB — CBC WITH DIFFERENTIAL/PLATELET
BASOS ABS: 0 10*3/uL (ref 0.0–0.1)
Basophils Relative: 0.4 % (ref 0.0–3.0)
Eosinophils Absolute: 0.4 10*3/uL (ref 0.0–0.7)
Eosinophils Relative: 3.4 % (ref 0.0–5.0)
HCT: 44.5 % (ref 39.0–52.0)
HEMOGLOBIN: 14.8 g/dL (ref 13.0–17.0)
Lymphocytes Relative: 21.4 % (ref 12.0–46.0)
Lymphs Abs: 2.6 10*3/uL (ref 0.7–4.0)
MCHC: 33.3 g/dL (ref 30.0–36.0)
MCV: 94.8 fl (ref 78.0–100.0)
MONO ABS: 1 10*3/uL (ref 0.1–1.0)
Monocytes Relative: 8 % (ref 3.0–12.0)
Neutro Abs: 8.1 10*3/uL — ABNORMAL HIGH (ref 1.4–7.7)
Neutrophils Relative %: 66.8 % (ref 43.0–77.0)
Platelets: 205 10*3/uL (ref 150.0–400.0)
RBC: 4.7 Mil/uL (ref 4.22–5.81)
RDW: 14.6 % (ref 11.5–15.5)
WBC: 12.1 10*3/uL — AB (ref 4.0–10.5)

## 2014-11-03 NOTE — Addendum Note (Signed)
Addended by: Tasia Catchings on: 11/03/2014 01:54 PM   Modules accepted: Orders

## 2014-11-04 ENCOUNTER — Other Ambulatory Visit: Payer: Medicare Other

## 2014-11-10 ENCOUNTER — Other Ambulatory Visit: Payer: Self-pay | Admitting: Medical

## 2014-11-10 ENCOUNTER — Other Ambulatory Visit (INDEPENDENT_AMBULATORY_CARE_PROVIDER_SITE_OTHER): Payer: Medicare Other

## 2014-11-10 ENCOUNTER — Ambulatory Visit (HOSPITAL_BASED_OUTPATIENT_CLINIC_OR_DEPARTMENT_OTHER)
Admission: RE | Admit: 2014-11-10 | Discharge: 2014-11-10 | Disposition: A | Discharge status: Home / Self Care | Payer: Medicare Other | Source: Ambulatory Visit | Attending: Medical | Admitting: Medical

## 2014-11-10 ENCOUNTER — Ambulatory Visit (INDEPENDENT_AMBULATORY_CARE_PROVIDER_SITE_OTHER): Payer: Medicare Other

## 2014-11-10 DIAGNOSIS — Z23 Encounter for immunization: Secondary | ICD-10-CM

## 2014-11-10 DIAGNOSIS — D72829 Elevated white blood cell count, unspecified: Secondary | ICD-10-CM | POA: Diagnosis not present

## 2014-11-10 DIAGNOSIS — J449 Chronic obstructive pulmonary disease, unspecified: Secondary | ICD-10-CM | POA: Insufficient documentation

## 2014-11-10 DIAGNOSIS — R918 Other nonspecific abnormal finding of lung field: Secondary | ICD-10-CM

## 2014-11-11 LAB — CBC WITH DIFFERENTIAL/PLATELET
BASOS PCT: 1.9 % (ref 0.0–3.0)
Basophils Absolute: 0.2 10*3/uL — ABNORMAL HIGH (ref 0.0–0.1)
EOS ABS: 0.3 10*3/uL (ref 0.0–0.7)
EOS PCT: 3.2 % (ref 0.0–5.0)
HEMATOCRIT: 41.2 % (ref 39.0–52.0)
HEMOGLOBIN: 13.4 g/dL (ref 13.0–17.0)
LYMPHS PCT: 33.5 % (ref 12.0–46.0)
Lymphs Abs: 3.3 10*3/uL (ref 0.7–4.0)
MCHC: 32.6 g/dL (ref 30.0–36.0)
MCV: 95.6 fl (ref 78.0–100.0)
Monocytes Absolute: 0.9 10*3/uL (ref 0.1–1.0)
Monocytes Relative: 9 % (ref 3.0–12.0)
Neutro Abs: 5.2 10*3/uL (ref 1.4–7.7)
Neutrophils Relative %: 52.4 % (ref 43.0–77.0)
Platelets: 237 10*3/uL (ref 150.0–400.0)
RBC: 4.31 Mil/uL (ref 4.22–5.81)
RDW: 15.1 % (ref 11.5–15.5)
WBC: 9.9 10*3/uL (ref 4.0–10.5)

## 2014-11-21 ENCOUNTER — Ambulatory Visit: Payer: Medicare Other | Admitting: Medical

## 2014-12-05 ENCOUNTER — Ambulatory Visit: Payer: Medicare Other | Admitting: Emergency Medicine

## 2014-12-08 LAB — HM DIABETES EYE EXAM

## 2014-12-12 ENCOUNTER — Ambulatory Visit: Payer: Medicare Other | Admitting: Emergency Medicine

## 2014-12-23 ENCOUNTER — Telehealth: Payer: Self-pay | Admitting: Medical

## 2014-12-23 ENCOUNTER — Encounter (HOSPITAL_BASED_OUTPATIENT_CLINIC_OR_DEPARTMENT_OTHER): Payer: Self-pay | Admitting: *Deleted

## 2014-12-23 ENCOUNTER — Emergency Department (HOSPITAL_BASED_OUTPATIENT_CLINIC_OR_DEPARTMENT_OTHER)
Admission: EM | Admit: 2014-12-23 | Discharge: 2014-12-23 | Disposition: A | Discharge status: Home / Self Care | Payer: Medicare Other | Attending: Emergency Medicine | Admitting: Emergency Medicine

## 2014-12-23 DIAGNOSIS — E119 Type 2 diabetes mellitus without complications: Secondary | ICD-10-CM | POA: Diagnosis not present

## 2014-12-23 DIAGNOSIS — Z7984 Long term (current) use of oral hypoglycemic drugs: Secondary | ICD-10-CM | POA: Diagnosis not present

## 2014-12-23 DIAGNOSIS — J449 Chronic obstructive pulmonary disease, unspecified: Secondary | ICD-10-CM | POA: Diagnosis not present

## 2014-12-23 DIAGNOSIS — M069 Rheumatoid arthritis, unspecified: Secondary | ICD-10-CM | POA: Insufficient documentation

## 2014-12-23 DIAGNOSIS — Z7982 Long term (current) use of aspirin: Secondary | ICD-10-CM | POA: Diagnosis not present

## 2014-12-23 DIAGNOSIS — M1990 Unspecified osteoarthritis, unspecified site: Secondary | ICD-10-CM | POA: Diagnosis not present

## 2014-12-23 DIAGNOSIS — Z79899 Other long term (current) drug therapy: Secondary | ICD-10-CM | POA: Diagnosis not present

## 2014-12-23 DIAGNOSIS — F329 Major depressive disorder, single episode, unspecified: Secondary | ICD-10-CM | POA: Insufficient documentation

## 2014-12-23 DIAGNOSIS — E785 Hyperlipidemia, unspecified: Secondary | ICD-10-CM | POA: Insufficient documentation

## 2014-12-23 DIAGNOSIS — Z792 Long term (current) use of antibiotics: Secondary | ICD-10-CM | POA: Insufficient documentation

## 2014-12-23 DIAGNOSIS — Z9981 Dependence on supplemental oxygen: Secondary | ICD-10-CM | POA: Insufficient documentation

## 2014-12-23 DIAGNOSIS — G4733 Obstructive sleep apnea (adult) (pediatric): Secondary | ICD-10-CM | POA: Diagnosis not present

## 2014-12-23 DIAGNOSIS — Z8719 Personal history of other diseases of the digestive system: Secondary | ICD-10-CM | POA: Diagnosis not present

## 2014-12-23 DIAGNOSIS — R42 Dizziness and giddiness: Secondary | ICD-10-CM | POA: Diagnosis present

## 2014-12-23 DIAGNOSIS — Z87891 Personal history of nicotine dependence: Secondary | ICD-10-CM | POA: Diagnosis not present

## 2014-12-23 DIAGNOSIS — I10 Essential (primary) hypertension: Secondary | ICD-10-CM | POA: Insufficient documentation

## 2014-12-23 DIAGNOSIS — H81391 Other peripheral vertigo, right ear: Secondary | ICD-10-CM | POA: Diagnosis not present

## 2014-12-23 MED ORDER — MECLIZINE HCL 25 MG PO TABS: 25.0000 mg | ORAL_TABLET | Freq: Three times a day (TID) | ORAL | Status: DC | PRN

## 2014-12-23 MED ORDER — MECLIZINE HCL 25 MG PO TABS
25.0000 mg | ORAL_TABLET | Freq: Once | ORAL | Status: AC
  Administered 2014-12-23: 25 mg via ORAL
  Filled 2014-12-23: qty 1

## 2014-12-23 NOTE — Telephone Encounter (Signed)
FYI- Patients daughter called . States patient has vertigo,nausea,dehydration. Says she thinks Dad is taking medications incorrectly. Weighs 281lbs. He is upset about wife that is in Hospice. Spoke with [facility] who advised them to take him to UC . Wanted to know if she should bring him here to see you. Advised that with as many problems as he is having today,she probably needs to take him to [facility]. Advised that you would possibly send him to ED for further evaluation.

## 2014-12-23 NOTE — Telephone Encounter (Signed)
Patient Name: AVETIS CASTER DOB: [birth year] Initial Comment Caller States father has vertigo, dizzy, has tried to vomit but cant, COPD Nurse Assessment Nurse: Marcelline Deist, RN, Lynda Date/Time (Eastern Time): 12/23/2014 1:05:03 PM Confirm and document reason for call. If symptomatic, describe symptoms. ---Caller States father has vertigo, dizzy, has tried to vomit but can't, COPD, on O2. Has the patient traveled out of the country within the last 30 days? ---Not Applicable Does the patient have any new or worsening symptoms? ---Yes Will a triage be completed? ---Yes Related visit to physician within the last 2 weeks? ---No Does the PT have any chronic conditions? (i.e. diabetes, asthma, etc.) ---Yes List chronic conditions. ---COPD, diabetes, had surgery for daily vomiting. Is this a behavioral health or substance abuse call? ---No Guidelines Guideline Title Affirmed Question Affirmed Notes Dizziness - Vertigo [1] Dizziness (vertigo) present now AND [2] one or more stroke risk factors (i.e., hypertension, diabetes, prior stroke/TIA/ heart attack) (Exception: prior physician evaluation for this AND no different/worse than usual) Final Disposition User Go to ED Now (or PCP triage) Marcelline Deist, RN, Lynda Comments Caller states her mother is in the final part of hospice care and this has been very hard on her father, making him anxious. She will take him to the hospital near the office to be evaluated. Referrals GO TO FACILITY UNDECIDED [place] - ED Disagree/Comply: Comply

## 2014-12-23 NOTE — ED Provider Notes (Signed)
CSN: [id number]     Arrival date & time 12/23/14  1446 History   First MD Initiated Contact with Patient 12/23/14 1600     Chief Complaint  Patient presents with  . Dizziness     (Consider location/radiation/quality/duration/timing/severity/associated sxs/prior Treatment) Patient is a 81 y.o. male presenting with dizziness. The history is provided by the patient.  Dizziness Quality:  Head spinning Severity:  Severe Onset quality:  Sudden Duration:  1 day Timing:  Intermittent Progression:  Waxing and waning Chronicity:  New Context: bending over and head movement   Relieved by: time, not bending head down. Worsened by:  Nothing Ineffective treatments:  None tried Associated symptoms: no chest pain, no diarrhea, no headaches, no nausea, no palpitations, no shortness of breath and no vomiting   Risk factors: no hx of vertigo and no Meniere's disease     81 yo M with a chief complaint of vertigo. Patient states it feels like the room is spinning around him. This occurs for about 30 seconds at a time and usually when he bent his head down to tie his shoes. Patient has had 3 episodes of this today. Currently not having the sensation. Patient went and saw his family doctor and had the sensation when he bent over and put his shoes back on. They sent him here. Patient denies ringing in his ears denies headache denies head injury. Patient denies blood thinner use denies fevers or chills.  Past Medical History  Diagnosis Date  . Osteoarthritis   . Rheumatoid arthritis(714.0)   . Hyperlipemia   . Diabetes mellitus   . COPD (chronic obstructive pulmonary disease) ([place])   . Sleep apnea     cpap  . Hypertension   . Depression 03/08/2014  . GERD (gastroesophageal reflux disease) 03/08/2014  . OSA (obstructive sleep apnea)   . History of blood transfusion    Past Surgical History  Procedure Laterality Date  . Appendectomy  1969  . Nissen fundoplication    . Hernia repair  1990    right  and left inguinal  . Vasectomy  1972  . Tonsillectomy  1937  . Transurethral resection of prostate  01/23/2011    Procedure: TRANSURETHRAL RESECTION OF THE PROSTATE WITH GYRUS INSTRUMENTS;  Surgeon: Molli Hazard, MD;  Location: [facility];  Service: Urology;  Laterality: N/A;  . Cystoscopy  01/23/2011    Procedure: CYSTOSCOPY;  Surgeon: Molli Hazard, MD;  Location: [facility];  Service: Urology;  Laterality: N/A;  . Flexible sigmoidoscopy  01/07/2012    Procedure: FLEXIBLE SIGMOIDOSCOPY;  Surgeon: Ladene Artist, MD,FACG;  Location: [facility];  Service: Endoscopy;  Laterality: N/A;  . Sigmoidectomy  2004    colovesical fistula  . Cholecystectomy N/A 06/16/2014    Procedure: LAPAROSCOPIC CHOLECYSTECTOMY;  Surgeon: Greer Pickerel, MD;  Location: [facility];  Service: General;  Laterality: N/A;   Family History  Problem Relation Age of Onset  . Emphysema Brother   . Heart disease Mother   . Cancer Daughter     breast   Social History  Substance Use Topics  . Smoking status: Former Smoker -- 4.00 packs/day for 30 years    Types: Cigarettes    Quit date: 01/21/1978  . Smokeless tobacco: Never Used  . Alcohol Use: No    Review of Systems  Constitutional: Negative for fever and chills.  HENT: Negative for congestion and facial swelling.   Eyes: Negative for discharge and visual disturbance.  Respiratory: Negative for  shortness of breath.   Cardiovascular: Negative for chest pain and palpitations.  Gastrointestinal: Negative for nausea, vomiting, abdominal pain and diarrhea.  Musculoskeletal: Negative for myalgias and arthralgias.  Skin: Negative for color change and rash.  Neurological: Positive for dizziness. Negative for tremors, syncope and headaches.  Psychiatric/Behavioral: Negative for confusion and dysphoric mood.      Allergies  Review of patient's allergies indicates no known allergies.  Home Medications   Prior to  Admission medications   Medication Sig Start Date End Date Taking? Authorizing Provider  albuterol (PROVENTIL HFA;VENTOLIN HFA) 108 (90 BASE) MCG/ACT inhaler Inhale 2 puffs into the lungs every 6 (six) hours as needed for wheezing or shortness of breath. 03/23/14   [name], PA-C  aspirin 81 MG tablet Take 1 tablet (81 mg total) by mouth every morning. Stop for 1 week and then resume Patient taking differently: Take 81 mg by mouth every morning.  01/08/12   Barton Dubois, MD  atorvastatin (LIPITOR) 40 MG tablet Take 1 tablet (40 mg total) by mouth every morning. 10/18/14   [name], PA-C  benzonatate (TESSALON) 100 MG capsule Take 1 capsule (100 mg total) by mouth 3 (three) times daily as needed for cough. 11/02/14   [name] [name], PA-C  celecoxib (CELEBREX) 200 MG capsule Take 1 capsule (200 mg total) by mouth every evening. Hold for the next 5 days and then resume 01/08/12   Barton Dubois, MD  cholecalciferol (VITAMIN D) 1000 UNITS tablet Take 2,000 Units by mouth 2 (two) times daily.     Historical Provider, MD  clonazePAM (KLONOPIN) 0.25 MG disintegrating tablet 1 tab po q hs prn insomnia or anxiety 06/27/14   [name], PA-C  doxycycline (VIBRA-TABS) 100 MG tablet Take 1 tablet (100 mg total) by mouth 2 (two) times daily. 11/02/14   [name] [name], PA-C  enalapril (VASOTEC) 2.5 MG tablet Take 2.5 mg by mouth every morning. 06/13/14 STATES NOT TAKING AT PRESENT    Historical Provider, MD  Indacaterol Maleate (ARCAPTA NEOHALER) 75 MCG CAPS Place 1 capsule into inhaler and inhale every morning. 01/01/12   Kathee Delton, MD  ipratropium (ATROVENT HFA) 17 MCG/ACT inhaler 2 inhalation po qid 11/02/14   [name], PA-C  meclizine (ANTIVERT) 25 MG tablet Take 1 tablet (25 mg total) by mouth 3 (three) times daily as needed for dizziness. 12/23/14   Deno Etienne, DO  metFORMIN (GLUCOPHAGE) 500 MG tablet Take 1 tablet (500 mg total) by mouth 3 (three) times daily. 05/30/14   [name] [name], PA-C   misoprostol (CYTOTEC) 100 MCG tablet TAKE ONE TABLET BY MOUTH IN THE MORNING THEN ONE TABLET IN THE EVENING THEN TWO TABLETS AT BEDTIME 10/26/14   [name], PA-C  Multiple Vitamins-Minerals (CENTRUM) tablet Take 1 tablet by mouth every morning.     Historical Provider, MD  OVER THE COUNTER MEDICATION Place 1-2 drops into both eyes daily as needed (dry red eyes.).    Historical Provider, MD  PARoxetine (PAXIL) 20 MG tablet Take 1 tablet (20 mg total) by mouth every morning. 10/27/14   Edward [name], PA-C  UNABLE TO FIND CPAP    Historical Provider, MD  vitamin C (ASCORBIC ACID) 500 MG tablet Take 500 mg by mouth every morning.     Historical Provider, MD   BP 138/81 mmHg  Pulse 78  Temp(Src) 98.1 F (36.7 C) (Oral)  Resp 18  Ht 6' (1.829 m)  Wt 250 lb (113.399 kg)  BMI 33.90 kg/m2  SpO2 96% Physical Exam  Constitutional: He is oriented to person, place, and time. He appears well-developed and well-nourished.  HENT:  Head: Normocephalic and atraumatic.  Eyes: EOM are normal. Pupils are equal, round, and reactive to light.  Neck: Normal range of motion. Neck supple. No JVD present.  Cardiovascular: Normal rate and regular rhythm.  Exam reveals no gallop and no friction rub.   No murmur heard. Pulmonary/Chest: No respiratory distress. He has no wheezes.  Abdominal: He exhibits no distension. There is no rebound and no guarding.  Musculoskeletal: Normal range of motion.  Neurological: He is alert and oriented to person, place, and time. No cranial nerve deficit or sensory deficit. He displays a negative Romberg sign. Coordination and gait normal. GCS eye subscore is 4. GCS verbal subscore is 5. GCS motor subscore is 6. He displays no Babinski's sign on the right side. He displays no Babinski's sign on the left side.  Right-sided fast going horizontal nystagmus  Skin: No rash noted. No pallor.  Psychiatric: He has a normal mood and affect. His behavior is normal.  Nursing note and  vitals reviewed.   ED Course  Procedures (including critical care time) Labs Review Labs Reviewed - No data to display  Imaging Review No results found. I have personally reviewed and evaluated these images and lab results as part of my medical decision-making.   EKG Interpretation None      MDM   Final diagnoses:  Peripheral vertigo, right    81 yo M with a chief complaint of vertigo. Most likely peripheral based on history and physical. Due to patient's age and risk factors concern for possibility of posterior circulation stroke. Patient's neuro exam benign not currently having any vertigo. Discussed MRI with patient. Currently refusing that therapy. Patient is otherwise well-appearing and has no current symptoms. Feel limited utility of laboratory evaluation as patient not having any symptoms gets up and moves around.  Patient with otherwise normal neuro exam without head injury.  Feel that CT scan would be unhelpful as it would not visualize the cerebellum. Will start on meclizine. Discussed follow-up with his family doctor suggested that he call him immediately after this for further instructions.  5:48 PM:  I have discussed the diagnosis/risks/treatment options with the patient and family and believe the pt to be eligible for discharge home to follow-up with PCP. We also discussed returning to the ED immediately if new or worsening sx occur. We discussed the sx which are most concerning (e.g., sudden worsening vertigo, inability to walk, confusion, slurred speech, headache) that necessitate immediate return. Medications administered to the patient during their visit and any new prescriptions provided to the patient are listed below.  Medications given during this visit Medications  meclizine (ANTIVERT) tablet 25 mg (25 mg Oral Given 12/23/14 1648)    Discharge Medication List as of 12/23/2014  4:41 PM    START taking these medications   Details  meclizine (ANTIVERT) 25 MG  tablet Take 1 tablet (25 mg total) by mouth 3 (three) times daily as needed for dizziness., Starting 12/23/2014, Until Discontinued, Print        The patient appears reasonably screen and/or stabilized for discharge and I doubt any other medical condition or other [facility] requiring further screening, evaluation, or treatment in the ED at this time prior to discharge.      Deno Etienne, DO 12/23/14 [phone]

## 2014-12-23 NOTE — ED Notes (Signed)
Dizziness since this am.

## 2014-12-23 NOTE — Telephone Encounter (Signed)
Based on what you described very high chance will send ED. Paricularly since it is late Friday and approaching this weekend. So advise UC or down stairs ED.

## 2014-12-23 NOTE — Telephone Encounter (Signed)
This was done. Daughter taking patient to [facility] or ED.

## 2014-12-23 NOTE — Discharge Instructions (Signed)
Benign Positional Vertigo °Vertigo is the feeling that you or your surroundings are moving when they are not. Benign positional vertigo is the most common form of vertigo. The cause of this condition is not serious (is benign). This condition is triggered by certain movements and positions (is positional). This condition can be dangerous if it occurs while you are doing something that could endanger you or others, such as driving.  °CAUSES °In many cases, the cause of this condition is not known. It may be caused by a disturbance in an area of the inner ear that helps your brain to sense movement and balance. This disturbance can be caused by a viral infection (labyrinthitis), head injury, or repetitive motion. °RISK FACTORS °This condition is more likely to develop in: °· Women. °· People who are 50 years of age or older. °SYMPTOMS °Symptoms of this condition usually happen when you move your head or your eyes in different directions. Symptoms may start suddenly, and they usually last for less than a minute. Symptoms may include: °· Loss of balance and falling. °· Feeling like you are spinning or moving. °· Feeling like your surroundings are spinning or moving. °· Nausea and vomiting. °· Blurred vision. °· Dizziness. °· Involuntary eye movement (nystagmus). °Symptoms can be mild and cause only slight annoyance, or they can be severe and interfere with daily life. Episodes of benign positional vertigo may return (recur) over time, and they may be triggered by certain movements. Symptoms may improve over time. °DIAGNOSIS °This condition is usually diagnosed by medical history and a physical exam of the head, neck, and ears. You may be referred to a health care provider who specializes in ear, nose, and throat (ENT) problems (otolaryngologist) or a provider who specializes in disorders of the nervous system (neurologist). You may have additional testing, including: °· MRI. °· A CT scan. °· Eye movement tests. Your  health care provider may ask you to change positions quickly while he or she watches you for symptoms of benign positional vertigo, such as nystagmus. Eye movement may be tested with an electronystagmogram (ENG), caloric stimulation, the Dix-Hallpike test, or the roll test. °· An electroencephalogram (EEG). This records electrical activity in your brain. °· Hearing tests. °TREATMENT °Usually, your health care provider will treat this by moving your head in specific positions to adjust your inner ear back to normal. Surgery may be needed in severe cases, but this is rare. In some cases, benign positional vertigo may resolve on its own in 2-4 weeks. °HOME CARE INSTRUCTIONS °Safety °· Move slowly. Avoid sudden body or head movements. °· Avoid driving. °· Avoid operating heavy machinery. °· Avoid doing any tasks that would be dangerous to you or others if a vertigo episode would occur. °· If you have trouble walking or keeping your balance, try using a cane for stability. If you feel dizzy or unstable, sit down right away. °· Return to your normal activities as told by your health care provider. Ask your health care provider what activities are safe for you. °General Instructions °· Take over-the-counter and prescription medicines only as told by your health care provider. °· Avoid certain positions or movements as told by your health care provider. °· Drink enough fluid to keep your urine clear or pale yellow. °· Keep all follow-up visits as told by your health care provider. This is important. °SEEK MEDICAL CARE IF: °· You have a fever. °· Your condition gets worse or you develop new symptoms. °· Your family or friends   notice any behavioral changes.  Your nausea or vomiting gets worse.  You have numbness or a "pins and needles" sensation. SEEK IMMEDIATE MEDICAL CARE IF:  You have difficulty speaking or moving.  You are always dizzy.  You faint.  You develop severe headaches.  You have weakness in your  legs or arms. Epley Maneuver Self-Care WHAT IS THE EPLEY MANEUVER? The Epley maneuver is an exercise you can do to relieve symptoms of benign paroxysmal positional vertigo (BPPV). This condition is often just referred to as vertigo. BPPV is caused by the movement of tiny crystals (canaliths) inside your inner ear. The accumulation and movement of canaliths in your inner ear causes a sudden spinning sensation (vertigo) when you move your head to certain positions. Vertigo usually lasts about 30 seconds. BPPV usually occurs in just one ear. If you get vertigo when you lie on your left side, you probably have BPPV in your left ear. Your health care provider can tell you which ear is involved.  BPPV may be caused by a head injury. Many people older than 50 get BPPV for unknown reasons. If you have been diagnosed with BPPV, your health care provider may teach you how to do this maneuver. BPPV is not life threatening (benign) and usually goes away in time.  WHEN SHOULD I PERFORM THE EPLEY MANEUVER? You can do this maneuver at home whenever you have symptoms of vertigo. You may do the Epley maneuver up to 3 times a day until your symptoms of vertigo go away. HOW SHOULD I DO THE EPLEY MANEUVER? Sit on the edge of a bed or table with your back straight. Your legs should be extended or hanging over the edge of the bed or table.  Turn your head halfway toward the affected ear.  Lie backward quickly with your head turned until you are lying flat on your back. You may want to position a pillow under your shoulders.  Hold this position for 30 seconds. You may experience an attack of vertigo. This is normal. Hold this position until the vertigo stops. Then turn your head to the opposite direction until your unaffected ear is facing the floor.  Hold this position for 30 seconds. You may experience an attack of vertigo. This is normal. Hold this position until the vertigo stops. Now turn your whole body to the same  side as your head. Hold for another 30 seconds.  You can then sit back up. ARE THERE RISKS TO THIS MANEUVER? In some cases, you may have other symptoms (such as changes in your vision, weakness, or numbness). If you have these symptoms, stop doing the maneuver and call your health care provider. Even if doing these maneuvers relieves your vertigo, you may still have dizziness. Dizziness is the sensation of light-headedness but without the sensation of movement. Even though the Epley maneuver may relieve your vertigo, it is possible that your symptoms will return within 5 years. WHAT SHOULD I DO AFTER THIS MANEUVER? After doing the Epley maneuver, you can return to your normal activities. Ask your doctor if there is anything you should do at home to prevent vertigo. This may include: Sleeping with two or more pillows to keep your head elevated. Not sleeping on the side of your affected ear. Getting up slowly from bed. Avoiding sudden movements during the day. Avoiding extreme head movement, like looking up or bending over. Wearing a cervical collar to prevent sudden head movements. WHAT SHOULD I DO IF MY SYMPTOMS GET WORSE?  Call your health care provider if your vertigo gets worse. Call your provider right way if you have other symptoms, including:  Nausea. Vomiting. Headache. Weakness. Numbness. Vision changes.   This information is not intended to replace advice given to you by your health care provider. Make sure you discuss any questions you have with your health care provider.   Document Released: 01/12/2013 Document Reviewed: 01/12/2013 Elsevier Interactive Patient Education 2016 [place] have changes in your hearing or vision.  You develop a stiff neck.  You develop sensitivity to light.   This information is not intended to replace advice given to you by your health care provider. Make sure you discuss any questions you have with your health care provider.     Document Released: 10/15/2005 Document Revised: 09/28/2014 Document Reviewed: 05/02/2014 Elsevier Interactive Patient Education [employer].

## 2014-12-30 ENCOUNTER — Telehealth: Payer: Self-pay | Admitting: Emergency Medicine

## 2014-12-30 NOTE — Telephone Encounter (Signed)
Spoke with pt's daughter, states that pt has no breathing complaints but following the death of pt's spouse, daughter has noticed increased shortness of breath, wheezing, dizziness.  Pt's daughter wishes for him to be evaluated.  Pt scheduled next week with TP.  Nothing further needed.

## 2015-01-03 ENCOUNTER — Ambulatory Visit: Payer: Medicare Other | Admitting: Internal Medicine

## 2015-01-05 ENCOUNTER — Ambulatory Visit (INDEPENDENT_AMBULATORY_CARE_PROVIDER_SITE_OTHER): Payer: Medicare Other | Admitting: Adult Health

## 2015-01-05 ENCOUNTER — Encounter: Payer: Self-pay | Admitting: Adult Health

## 2015-01-05 VITALS — BP 90/70 | HR 97 | Temp 98.0°F | Ht 72.0 in | Wt 253.0 lb

## 2015-01-05 DIAGNOSIS — J449 Chronic obstructive pulmonary disease, unspecified: Secondary | ICD-10-CM | POA: Diagnosis not present

## 2015-01-05 DIAGNOSIS — G4733 Obstructive sleep apnea (adult) (pediatric): Secondary | ICD-10-CM | POA: Diagnosis not present

## 2015-01-05 NOTE — Patient Instructions (Addendum)
Stop Atrovent .  Begin Stiolto 2 puffs daily  Refer to pulmonary rehab .  Wear CPAP At bedtime  .  Work on weight loss.  follow up Dr. Lamonte Sakai  In 6-8 weeks and As needed

## 2015-01-05 NOTE — Progress Notes (Signed)
Subjective:    Patient ID: Lance Powell, male    DOB: 12/16/1929, 81 y.o.   MRN: [id number]  HPI 81 yo male with COPD- GOLD II  and OSA on CPAP   TEST  PFT's 12/2010:  FEV1 2.04 (64%), FEV1% 55, +airtrapping, DLCO 46%    01/05/2015 Follow up : COPD and OSA  Pt returns for 6 month follow up .  Former pt of Dr. [name] .  Started on Atrovent in Oct after URI. Feels this has helped.  But still gets winded with activity .  Wife passed away last week ago. Support provided.  Denies chest pain, orthopnea, PND or leg swelling. CXR in Oct w/ COPD changes   Has OSA on CPAP At bedtime  . Says wears each night. Feels rested.  No download , forgot chip .   Discussed pulm rehab. He is interested. Referral made today .    Past Medical History  Diagnosis Date  . Osteoarthritis   . Rheumatoid arthritis(714.0)   . Hyperlipemia   . Diabetes mellitus   . COPD (chronic obstructive pulmonary disease) ([place])   . Sleep apnea     cpap  . Hypertension   . Depression 03/08/2014  . GERD (gastroesophageal reflux disease) 03/08/2014  . OSA (obstructive sleep apnea)   . History of blood transfusion   ' Current Outpatient Prescriptions on File Prior to Visit  Medication Sig Dispense Refill  . albuterol (PROVENTIL HFA;VENTOLIN HFA) 108 (90 BASE) MCG/ACT inhaler Inhale 2 puffs into the lungs every 6 (six) hours as needed for wheezing or shortness of breath. 1 Inhaler 2  . aspirin 81 MG tablet Take 1 tablet (81 mg total) by mouth every morning. Stop for 1 week and then resume (Patient taking differently: Take 81 mg by mouth every morning. )    . atorvastatin (LIPITOR) 40 MG tablet Take 1 tablet (40 mg total) by mouth every morning. 30 tablet 0  . benzonatate (TESSALON) 100 MG capsule Take 1 capsule (100 mg total) by mouth 3 (three) times daily as needed for cough. 21 capsule 0  . celecoxib (CELEBREX) 200 MG capsule Take 1 capsule (200 mg total) by mouth every evening. Hold for the next 5 days and then  resume    . cholecalciferol (VITAMIN D) 1000 UNITS tablet Take 2,000 Units by mouth 2 (two) times daily.     . clonazePAM (KLONOPIN) 0.25 MG disintegrating tablet 1 tab po q hs prn insomnia or anxiety 10 tablet 2  . doxycycline (VIBRA-TABS) 100 MG tablet Take 1 tablet (100 mg total) by mouth 2 (two) times daily. 20 tablet 0  . enalapril (VASOTEC) 2.5 MG tablet Take 2.5 mg by mouth every morning. 06/13/14 STATES NOT TAKING AT PRESENT    . ipratropium (ATROVENT HFA) 17 MCG/ACT inhaler 2 inhalation po qid 1 Inhaler 12  . metFORMIN (GLUCOPHAGE) 500 MG tablet Take 1 tablet (500 mg total) by mouth 3 (three) times daily. 90 tablet 3  . misoprostol (CYTOTEC) 100 MCG tablet TAKE ONE TABLET BY MOUTH IN THE MORNING THEN ONE TABLET IN THE EVENING THEN TWO TABLETS AT BEDTIME 120 tablet 3  . PARoxetine (PAXIL) 20 MG tablet Take 1 tablet (20 mg total) by mouth every morning. 30 tablet 3  . UNABLE TO FIND CPAP    . vitamin C (ASCORBIC ACID) 500 MG tablet Take 500 mg by mouth every morning.     . Indacaterol Maleate (ARCAPTA NEOHALER) 75 MCG CAPS Place 1 capsule into inhaler and  inhale every morning. (Patient not taking: Reported on 01/05/2015) 90 capsule 1  . meclizine (ANTIVERT) 25 MG tablet Take 1 tablet (25 mg total) by mouth 3 (three) times daily as needed for dizziness. (Patient not taking: Reported on 01/05/2015) 20 tablet 0  . Multiple Vitamins-Minerals (CENTRUM) tablet Take 1 tablet by mouth every morning. Reported on 01/05/2015    . OVER THE COUNTER MEDICATION Place 1-2 drops into both eyes daily as needed (dry red eyes.). Reported on 01/05/2015     No current facility-administered medications on file prior to visit.      Review of Systems  Constitutional:   No  weight loss, night sweats,  Fevers, chills, fatigue, or  lassitude.  HEENT:   No headaches,  Difficulty swallowing,  Tooth/dental problems, or  Sore throat,                No sneezing, itching, ear ache, nasal congestion, post nasal drip,    CV:  No chest pain,  Orthopnea, PND, swelling in lower extremities, anasarca, dizziness, palpitations, syncope.   GI  No heartburn, indigestion, abdominal pain, nausea, vomiting, diarrhea, change in bowel habits, loss of appetite, bloody stools.   Resp: No shortness of breath with exertion or at rest.  No excess mucus, no productive cough,  No non-productive cough,  No coughing up of blood.  No change in color of mucus.  No wheezing.  No chest wall deformity  Skin: no rash or lesions.  GU: no dysuria, change in color of urine, no urgency or frequency.  No flank pain, no hematuria   MS:  No joint pain or swelling.  No decreased range of motion.  No back pain.  Psych:  No change in mood or affect. No depression or anxiety.  No memory loss.         Objective:   Physical Exam GEN: A/Ox3; pleasant , NAD,  Elderly   HEENT:  [place]/AT,  EACs-clear, TMs-wnl, NOSE-clear, THROAT-clear, no lesions, no postnasal drip or exudate noted.   NECK:  Supple w/ fair ROM; no JVD; normal carotid impulses w/o bruits; no thyromegaly or nodules palpated; no lymphadenopathy.  RESP  Decreased BS in bases , no accessory muscle use, no dullness to percussion  CARD:  RRR, no m/r/g  , no peripheral edema, pulses intact, no cyanosis or clubbing.  GI:   Soft & nt; nml bowel sounds; no organomegaly or masses detected.  Musco: Warm bil, no deformities or joint swelling noted.   Neuro: alert, no focal deficits noted.    Skin: Warm, no lesions or rashes         Assessment & Plan:

## 2015-01-10 ENCOUNTER — Telehealth: Payer: Self-pay | Admitting: Adult Health

## 2015-01-10 MED ORDER — TIOTROPIUM BROMIDE-OLODATEROL 2.5-2.5 MCG/ACT IN AERS
2.0000 | INHALATION_SPRAY | Freq: Every day | RESPIRATORY_TRACT | Status: DC
Start: 2015-01-10 — End: 2018-10-16

## 2015-01-10 NOTE — Telephone Encounter (Signed)
Spoke with pt, states stiolto inhaler was never called in from last ov.  Requesting this be resent.  This has been done.  Nothing further needed.

## 2015-01-12 NOTE — Assessment & Plan Note (Signed)
COPD  Plan  Stop Atrovent .  Begin Stiolto 2 puffs daily  Refer to pulmonary rehab .  follow up Dr. Lamonte Sakai  In 6-8 weeks and As needed

## 2015-01-12 NOTE — Assessment & Plan Note (Signed)
Wear CPAP At bedtime  .  Work on weight loss.  follow up Dr. Lamonte Sakai  In 6-8 weeks and As needed

## 2015-02-27 ENCOUNTER — Encounter: Payer: Self-pay | Admitting: Medical

## 2015-02-27 ENCOUNTER — Ambulatory Visit (INDEPENDENT_AMBULATORY_CARE_PROVIDER_SITE_OTHER): Payer: Medicare Other | Admitting: Medical

## 2015-02-27 VITALS — BP 118/76 | HR 112 | Temp 97.5°F | Ht 72.0 in | Wt 256.4 lb

## 2015-02-27 DIAGNOSIS — G4700 Insomnia, unspecified: Secondary | ICD-10-CM | POA: Diagnosis not present

## 2015-02-27 DIAGNOSIS — R197 Diarrhea, unspecified: Secondary | ICD-10-CM | POA: Diagnosis not present

## 2015-02-27 MED ORDER — CLONAZEPAM 0.25 MG PO TBDP: ORAL_TABLET | ORAL | Status: DC

## 2015-02-27 NOTE — Progress Notes (Signed)
Subjective:    Patient ID: Lance Powell, male    DOB: May 08, 1929, 82 y.o.   MRN: [id number]  HPI  Pt in with diarrhea for 2 days. Pt had diarrhea about 4-5 times on Friday and twice on Saturday. On Sunday had regular bowel movement. Pt took one immodium on Sunday. No recurrent diarrhea. No fever, no chills, or sweats. Today feels fine.  Pt is having trouble sleeping over past month. Seemed to get worse with passing of his wife about 2 months ago. Pt in past was prescribed clonazepam. He never used these.       Review of Systems  Constitutional: Negative for fever, chills and fatigue.  Respiratory: Negative for cough, choking, chest tightness, shortness of breath and wheezing.   Cardiovascular: Negative for palpitations.  Gastrointestinal: Negative for nausea, vomiting, abdominal pain, diarrhea, constipation, blood in stool, abdominal distention and rectal pain.  Musculoskeletal: Negative for back pain.  Neurological: Positive for headaches. Negative for dizziness.  Hematological: Negative for adenopathy. Does not bruise/bleed easily.  Psychiatric/Behavioral: Positive for sleep disturbance. Negative for suicidal ideas, behavioral problems, dysphoric mood and decreased concentration. The patient is nervous/anxious.    Past Medical History  Diagnosis Date  . Osteoarthritis   . Rheumatoid arthritis(714.0)   . Hyperlipemia   . Diabetes mellitus   . COPD (chronic obstructive pulmonary disease) ([place])   . Sleep apnea     cpap  . Hypertension   . Depression 03/08/2014  . GERD (gastroesophageal reflux disease) 03/08/2014  . OSA (obstructive sleep apnea)   . History of blood transfusion     Social History   Social History  . Marital Status: Married    Spouse Name: N/A  . Number of Children: 6  . Years of Education: N/A   Occupational History  . Retired     Chief Financial Officer   Social History Main Topics  . Smoking status: Former Smoker -- 4.00 packs/day for 30 years    Types:  Cigarettes    Quit date: 01/21/1978  . Smokeless tobacco: Never Used  . Alcohol Use: No  . Drug Use: No  . Sexual Activity: Not on file   Other Topics Concern  . Not on file   Social History Narrative    Past Surgical History  Procedure Laterality Date  . Appendectomy  1969  . Nissen fundoplication    . Hernia repair  1990    right and left inguinal  . Vasectomy  1972  . Tonsillectomy  1937  . Transurethral resection of prostate  01/23/2011    Procedure: TRANSURETHRAL RESECTION OF THE PROSTATE WITH GYRUS INSTRUMENTS;  Surgeon: Molli Hazard, MD;  Location: [facility];  Service: Urology;  Laterality: N/A;  . Cystoscopy  01/23/2011    Procedure: CYSTOSCOPY;  Surgeon: Molli Hazard, MD;  Location: [facility];  Service: Urology;  Laterality: N/A;  . Flexible sigmoidoscopy  01/07/2012    Procedure: FLEXIBLE SIGMOIDOSCOPY;  Surgeon: Ladene Artist, MD,FACG;  Location: [facility];  Service: Endoscopy;  Laterality: N/A;  . Sigmoidectomy  2004    colovesical fistula  . Cholecystectomy N/A 06/16/2014    Procedure: LAPAROSCOPIC CHOLECYSTECTOMY;  Surgeon: Greer Pickerel, MD;  Location: [facility];  Service: General;  Laterality: N/A;    Family History  Problem Relation Age of Onset  . Emphysema Brother   . Heart disease Mother   . Cancer Daughter     breast    No Known Allergies  Current Outpatient Prescriptions on  File Prior to Visit  Medication Sig Dispense Refill  . albuterol (PROVENTIL HFA;VENTOLIN HFA) 108 (90 BASE) MCG/ACT inhaler Inhale 2 puffs into the lungs every 6 (six) hours as needed for wheezing or shortness of breath. 1 Inhaler 2  . aspirin 81 MG tablet Take 1 tablet (81 mg total) by mouth every morning. Stop for 1 week and then resume (Patient taking differently: Take 81 mg by mouth every morning. )    . atorvastatin (LIPITOR) 40 MG tablet Take 1 tablet (40 mg total) by mouth every morning. 30 tablet 0  . benzonatate (TESSALON)  100 MG capsule Take 1 capsule (100 mg total) by mouth 3 (three) times daily as needed for cough. 21 capsule 0  . celecoxib (CELEBREX) 200 MG capsule Take 1 capsule (200 mg total) by mouth every evening. Hold for the next 5 days and then resume    . cholecalciferol (VITAMIN D) 1000 UNITS tablet Take 2,000 Units by mouth 2 (two) times daily.     Marland Kitchen doxycycline (VIBRA-TABS) 100 MG tablet Take 1 tablet (100 mg total) by mouth 2 (two) times daily. 20 tablet 0  . enalapril (VASOTEC) 2.5 MG tablet Take 2.5 mg by mouth every morning. 06/13/14 STATES NOT TAKING AT PRESENT    . Indacaterol Maleate (ARCAPTA NEOHALER) 75 MCG CAPS Place 1 capsule into inhaler and inhale every morning. 90 capsule 1  . ipratropium (ATROVENT HFA) 17 MCG/ACT inhaler 2 inhalation po qid 1 Inhaler 12  . meclizine (ANTIVERT) 25 MG tablet Take 1 tablet (25 mg total) by mouth 3 (three) times daily as needed for dizziness. 20 tablet 0  . metFORMIN (GLUCOPHAGE) 500 MG tablet Take 1 tablet (500 mg total) by mouth 3 (three) times daily. 90 tablet 3  . misoprostol (CYTOTEC) 100 MCG tablet TAKE ONE TABLET BY MOUTH IN THE MORNING THEN ONE TABLET IN THE EVENING THEN TWO TABLETS AT BEDTIME 120 tablet 3  . Multiple Vitamins-Minerals (CENTRUM) tablet Take 1 tablet by mouth every morning. Reported on 01/05/2015    . OVER THE COUNTER MEDICATION Place 1-2 drops into both eyes daily as needed (dry red eyes.). Reported on 01/05/2015    . PARoxetine (PAXIL) 20 MG tablet Take 1 tablet (20 mg total) by mouth every morning. 30 tablet 3  . Tiotropium Bromide-Olodaterol (STIOLTO RESPIMAT) 2.5-2.5 MCG/ACT AERS Inhale 2 puffs into the lungs daily. 4 g 5  . UNABLE TO FIND CPAP    . vitamin C (ASCORBIC ACID) 500 MG tablet Take 500 mg by mouth every morning.      No current facility-administered medications on file prior to visit.    BP 118/76 mmHg  Pulse 112  Temp(Src) 97.5 F (36.4 C) (Oral)  Ht 6' (1.829 m)  Wt 256 lb 6.4 oz (116.302 kg)  BMI 34.77 kg/m2   SpO2 97%       Objective:   Physical Exam  General Appearance- Not in acute distress.  HEENT Eyes- Scleraeral/Conjuntiva-bilat- Not Yellow. Mouth & Throat- Normal.  Chest and Lung Exam Auscultation: Breath sounds:-Normal. Adventitious sounds:- No Adventitious sounds.  Cardiovascular Auscultation:Rythm - Regular. Heart Sounds -Normal heart sounds.  Abdomen Inspection:-Inspection Normal.  Palpation/Perucssion: Palpation and Percussion of the abdomen reveal- Non Tender, No Rebound tenderness, No rigidity(Guarding) and No Palpable abdominal masses.  Liver:-Normal.  Spleen:- Normal.    Neurologic Cranial Nerve exam:- CN III-XII intact(No nystagmus), symmetric smile. Normal/Intact Strength:- 5/5 equal and symmetric strength both upper and lower extremities.  Skin- not dry      Assessment &  Plan:  For diarrhea I think this may have been viral and already resolved. If you get recurent diarrhea or any abdomen pain let us know and will do work up and stool panel studies.  For insomnia and low level anxiety will refill the clonazepam.(Rx advisement given)  Follow up 4-6 wks with me and come in fasting so labs can get done.

## 2015-02-27 NOTE — Patient Instructions (Addendum)
For diarrhea I think this may have been viral and already resolved. If you get recurent diarrhea or any abdomen pain let us know and will do work up and stool panel studies.  For insomnia and low level anxiety will refill the clonazepam.(Rx advisement given)  Follow up 4-6 wks with me and come in fasting so labs can get done.

## 2015-02-27 NOTE — Progress Notes (Signed)
Pre visit review using our clinic review tool, if applicable. No additional management support is needed unless otherwise documented below in the visit note. 

## 2015-03-03 DIAGNOSIS — J449 Chronic obstructive pulmonary disease, unspecified: Secondary | ICD-10-CM | POA: Diagnosis not present

## 2015-03-03 DIAGNOSIS — G4733 Obstructive sleep apnea (adult) (pediatric): Secondary | ICD-10-CM | POA: Diagnosis not present

## 2015-03-13 ENCOUNTER — Other Ambulatory Visit: Payer: Self-pay | Admitting: Medical

## 2015-03-16 ENCOUNTER — Ambulatory Visit: Payer: Medicare Other | Admitting: Emergency Medicine

## 2015-03-20 ENCOUNTER — Other Ambulatory Visit: Payer: Self-pay

## 2015-03-20 NOTE — Telephone Encounter (Signed)
Pt requesting a refill on his atorvastatin 40 mg. There is not a recent lipid panel.  Please advise if ok to refill 30 day supply and have pt come in fasting.

## 2015-03-21 MED ORDER — ATORVASTATIN CALCIUM 40 MG PO TABS: 40.0000 mg | ORAL_TABLET | Freq: Every morning | ORAL | Status: DC

## 2015-03-21 NOTE — Telephone Encounter (Signed)
Refilled his lipitor. Ask to come in one month for check up. Make sure he is given 30 minute appointment.

## 2015-03-24 ENCOUNTER — Telehealth (HOSPITAL_COMMUNITY): Payer: Self-pay

## 2015-03-27 ENCOUNTER — Encounter: Payer: Self-pay | Admitting: Medical

## 2015-03-27 ENCOUNTER — Telehealth (HOSPITAL_COMMUNITY): Payer: Self-pay

## 2015-03-27 ENCOUNTER — Ambulatory Visit (INDEPENDENT_AMBULATORY_CARE_PROVIDER_SITE_OTHER): Payer: Medicare Other | Admitting: Medical

## 2015-03-27 VITALS — BP 122/78 | HR 96 | Temp 97.4°F | Ht 72.0 in | Wt 255.8 lb

## 2015-03-27 DIAGNOSIS — G4700 Insomnia, unspecified: Secondary | ICD-10-CM | POA: Diagnosis not present

## 2015-03-27 DIAGNOSIS — E119 Type 2 diabetes mellitus without complications: Secondary | ICD-10-CM

## 2015-03-27 DIAGNOSIS — R197 Diarrhea, unspecified: Secondary | ICD-10-CM

## 2015-03-27 DIAGNOSIS — Z23 Encounter for immunization: Secondary | ICD-10-CM

## 2015-03-27 DIAGNOSIS — H9193 Unspecified hearing loss, bilateral: Secondary | ICD-10-CM

## 2015-03-27 LAB — COMPREHENSIVE METABOLIC PANEL
ALK PHOS: 75 U/L (ref 39–117)
ALT: 29 U/L (ref 0–53)
AST: 22 U/L (ref 0–37)
Albumin: 4.2 g/dL (ref 3.5–5.2)
BILIRUBIN TOTAL: 0.5 mg/dL (ref 0.2–1.2)
BUN: 18 mg/dL (ref 6–23)
CALCIUM: 10.5 mg/dL (ref 8.4–10.5)
CO2: 29 mEq/L (ref 19–32)
Chloride: 102 mEq/L (ref 96–112)
Creatinine, Ser: 1.07 mg/dL (ref 0.40–1.50)
GFR: 70.07 mL/min (ref >60.00)
Glucose, Bld: 146 mg/dL — ABNORMAL HIGH (ref 70–99)
Potassium: 4 mEq/L (ref 3.5–5.1)
Sodium: 137 mEq/L (ref 135–145)
TOTAL PROTEIN: 7.3 g/dL (ref 6.0–8.3)

## 2015-03-27 LAB — HEMOGLOBIN A1C: Hgb A1c MFr Bld: 8.2 % — ABNORMAL HIGH (ref 4.6–6.5)

## 2015-03-27 NOTE — Progress Notes (Signed)
Subjective:    Patient ID: Lance Powell, male    DOB: 1929/03/22, 82 y.o.   MRN: [id number]  HPI   Pt in rfollow up on his insomnia. He took one tablet clonazepma and it does help a lot. He used just one tablet and reported success. But then he lost the bottle. He has refills but he is convinced can find bottle.  Daughter thinks he is anxious all the time. He is on paxil 20 mg a day.  Pt has trouble hearing. He has been to [place] but he did not qualify for hearing aid. But he wants to be evaluated again. Daughter in agreement.  Pt had one had loose stools yesterday twice.(no association with any particular foods) Then today is normal. No fever, no chills or sweats. Same as last time his loose stools resolved quickly.  Pt blood sugar was 123 this am. Pt had not  a1-c done recently. On review not real strict with diet.    Review of Systems  Constitutional: Negative for chills and fatigue.  HENT: Negative for congestion.   Respiratory: Negative for cough, chest tightness, shortness of breath and wheezing.   Cardiovascular: Negative for chest pain and palpitations.  Musculoskeletal: Negative for back pain.  Neurological: Negative for dizziness and headaches.  Hematological: Negative for adenopathy. Does not bruise/bleed easily.  Psychiatric/Behavioral: Negative for behavioral problems and confusion. The patient is nervous/anxious.      Past Medical History  Diagnosis Date  . Osteoarthritis   . Rheumatoid arthritis(714.0)   . Hyperlipemia   . Diabetes mellitus   . COPD (chronic obstructive pulmonary disease) ([place])   . Sleep apnea     cpap  . Hypertension   . Depression 03/08/2014  . GERD (gastroesophageal reflux disease) 03/08/2014  . OSA (obstructive sleep apnea)   . History of blood transfusion     Social History   Social History  . Marital Status: Married    Spouse Name: N/A  . Number of Children: 6  . Years of Education: N/A   Occupational History  . Retired       Chief Financial Officer   Social History Main Topics  . Smoking status: Former Smoker -- 4.00 packs/day for 30 years    Types: Cigarettes    Quit date: 01/21/1978  . Smokeless tobacco: Never Used  . Alcohol Use: No  . Drug Use: No  . Sexual Activity: Not on file   Other Topics Concern  . Not on file   Social History Narrative    Past Surgical History  Procedure Laterality Date  . Appendectomy  1969  . Nissen fundoplication    . Hernia repair  1990    right and left inguinal  . Vasectomy  1972  . Tonsillectomy  1937  . Transurethral resection of prostate  01/23/2011    Procedure: TRANSURETHRAL RESECTION OF THE PROSTATE WITH GYRUS INSTRUMENTS;  Surgeon: Molli Hazard, MD;  Location: [facility];  Service: Urology;  Laterality: N/A;  . Cystoscopy  01/23/2011    Procedure: CYSTOSCOPY;  Surgeon: Molli Hazard, MD;  Location: [facility];  Service: Urology;  Laterality: N/A;  . Flexible sigmoidoscopy  01/07/2012    Procedure: FLEXIBLE SIGMOIDOSCOPY;  Surgeon: Ladene Artist, MD,FACG;  Location: [facility];  Service: Endoscopy;  Laterality: N/A;  . Sigmoidectomy  2004    colovesical fistula  . Cholecystectomy N/A 06/16/2014    Procedure: LAPAROSCOPIC CHOLECYSTECTOMY;  Surgeon: Greer Pickerel, MD;  Location: [facility];  Service: General;  Laterality: N/A;    Family History  Problem Relation Age of Onset  . Emphysema Brother   . Heart disease Mother   . Cancer Daughter     breast    No Known Allergies  Current Outpatient Prescriptions on File Prior to Visit  Medication Sig Dispense Refill  . albuterol (PROVENTIL HFA;VENTOLIN HFA) 108 (90 BASE) MCG/ACT inhaler Inhale 2 puffs into the lungs every 6 (six) hours as needed for wheezing or shortness of breath. 1 Inhaler 2  . aspirin 81 MG tablet Take 1 tablet (81 mg total) by mouth every morning. Stop for 1 week and then resume (Patient taking differently: Take 81 mg by mouth every morning. )    .  atorvastatin (LIPITOR) 40 MG tablet Take 1 tablet (40 mg total) by mouth every morning. 30 tablet 0  . celecoxib (CELEBREX) 200 MG capsule Take 1 capsule (200 mg total) by mouth every evening. Hold for the next 5 days and then resume    . cholecalciferol (VITAMIN D) 1000 UNITS tablet Take 2,000 Units by mouth 2 (two) times daily.     . clonazePAM (KLONOPIN) 0.25 MG disintegrating tablet 1 tab po q hs prn insomnia or anxiety 10 tablet 2  . doxycycline (VIBRA-TABS) 100 MG tablet Take 1 tablet (100 mg total) by mouth 2 (two) times daily. 20 tablet 0  . enalapril (VASOTEC) 2.5 MG tablet Take 2.5 mg by mouth every morning. 06/13/14 STATES NOT TAKING AT PRESENT    . Indacaterol Maleate (ARCAPTA NEOHALER) 75 MCG CAPS Place 1 capsule into inhaler and inhale every morning. 90 capsule 1  . ipratropium (ATROVENT HFA) 17 MCG/ACT inhaler 2 inhalation po qid 1 Inhaler 12  . meclizine (ANTIVERT) 25 MG tablet Take 1 tablet (25 mg total) by mouth 3 (three) times daily as needed for dizziness. 20 tablet 0  . metFORMIN (GLUCOPHAGE) 500 MG tablet Take 1 tablet (500 mg total) by mouth 3 (three) times daily. 90 tablet 3  . misoprostol (CYTOTEC) 100 MCG tablet TAKE ONE TABLET BY MOUTH IN THE MORNING, ONE TABLET IN THE EVENING AND TWO TABLETS AT BEDTIME 120 tablet 0  . Multiple Vitamins-Minerals (CENTRUM) tablet Take 1 tablet by mouth every morning. Reported on 01/05/2015    . OVER THE COUNTER MEDICATION Place 1-2 drops into both eyes daily as needed (dry red eyes.). Reported on 01/05/2015    . PARoxetine (PAXIL) 20 MG tablet Take 1 tablet (20 mg total) by mouth every morning. 30 tablet 3  . Tiotropium Bromide-Olodaterol (STIOLTO RESPIMAT) 2.5-2.5 MCG/ACT AERS Inhale 2 puffs into the lungs daily. 4 g 5  . UNABLE TO FIND CPAP    . vitamin C (ASCORBIC ACID) 500 MG tablet Take 500 mg by mouth every morning.      No current facility-administered medications on file prior to visit.    BP 122/78 mmHg  Pulse 96  Temp(Src)  97.4 F (36.3 C) (Oral)  Ht 6' (1.829 m)  Wt 255 lb 12.8 oz (116.03 kg)  BMI 34.69 kg/m2  SpO2 93%       Objective:   Physical Exam  General Mental Status- Alert. General Appearance- Not in acute distress.   Skin General: Color- Normal Color. Moisture- Normal Moisture.  Neck Carotid Arteries- Normal color. Moisture- Normal Moisture. No carotid bruits. No JVD.  Chest and Lung Exam Auscultation: Breath Sounds:-Normal.  Cardiovascular Auscultation:Rythm- Regular. Murmurs & Other Heart Sounds:Auscultation of the heart reveals- No Murmurs.  Abdomen Inspection:-Inspeection Normal. Palpation/Percussion:Note:No mass. Palpation  and Percussion of the abdomen reveal- Non Tender, Non Distended + BS, no rebound or guarding.    Neurologic Cranial Nerve exam:- CN III-XII intact(No nystagmus), symmetric smile. Strength:- 5/5 equal and symmetric strength both upper and lower extremities.      Assessment & Plan:  For your anxiety and insomnia. Continue daily  paxil and sparing use of clonazepam.  Your diarrhea is self limited and described more like occasional loose stools. May be related to absence of your gallbladder.  Refer to audioloigst for hearing loss.  Will get a1-c today to check on average blood sugar.  tdap and pneumovaccine today.  Follow up in 3 months or as needed

## 2015-03-27 NOTE — Patient Instructions (Addendum)
For your anxiety and insomnia. Continue daily  paxil and sparing use of clonazepam.  Your diarrhea is self limited and described more like occasional loose stools. May be related to absence of your gallbladder.(If persists thn would get stool panel studies. Advised caution on eating greasy and fried foods. Eat healthy)  Refer to audioloigst for hearing loss.  Will get a1-c today to check on average blood sugar.(Will see if need oral med. Last a1-c have been less than 7)  tdap and pneumovaccine today.  Follow up in 3 months or as needed

## 2015-03-27 NOTE — Progress Notes (Signed)
Pre visit review using our clinic review tool, if applicable. No additional management support is needed unless otherwise documented below in the visit note. 

## 2015-03-31 DIAGNOSIS — J449 Chronic obstructive pulmonary disease, unspecified: Secondary | ICD-10-CM | POA: Diagnosis not present

## 2015-03-31 DIAGNOSIS — G4733 Obstructive sleep apnea (adult) (pediatric): Secondary | ICD-10-CM | POA: Diagnosis not present

## 2015-03-31 MED ORDER — ATORVASTATIN CALCIUM 40 MG PO TABS: 40.0000 mg | ORAL_TABLET | Freq: Every morning | ORAL | Status: DC

## 2015-03-31 MED ORDER — METFORMIN HCL 500 MG PO TABS: 500.0000 mg | ORAL_TABLET | Freq: Three times a day (TID) | ORAL | Status: DC

## 2015-03-31 MED ORDER — PAROXETINE HCL 20 MG PO TABS: 20.0000 mg | ORAL_TABLET | ORAL | Status: DC

## 2015-03-31 NOTE — Addendum Note (Signed)
Addended by: Tasia Catchings on: 03/31/2015 11:11 AM   Modules accepted: Orders

## 2015-04-27 ENCOUNTER — Ambulatory Visit (INDEPENDENT_AMBULATORY_CARE_PROVIDER_SITE_OTHER): Payer: Medicare Other | Admitting: Emergency Medicine

## 2015-04-27 ENCOUNTER — Encounter: Payer: Self-pay | Admitting: Emergency Medicine

## 2015-04-27 ENCOUNTER — Other Ambulatory Visit: Payer: Self-pay | Admitting: Medical

## 2015-04-27 VITALS — BP 122/70 | HR 114 | Ht 72.0 in | Wt 256.0 lb

## 2015-04-27 DIAGNOSIS — G4733 Obstructive sleep apnea (adult) (pediatric): Secondary | ICD-10-CM | POA: Diagnosis not present

## 2015-04-27 DIAGNOSIS — J438 Other emphysema: Secondary | ICD-10-CM

## 2015-04-27 MED ORDER — TIOTROPIUM BROMIDE-OLODATEROL 2.5-2.5 MCG/ACT IN AERS: 2.0000 | INHALATION_SPRAY | Freq: Every day | RESPIRATORY_TRACT | Status: DC

## 2015-04-27 NOTE — Assessment & Plan Note (Signed)
Please stop your Atrovent (ipratropium) inhaler for now Start using Stiolto 2 puffs once a day Use pro-air (albuterol) 2 puffs up to every 4 hours if needed for shortness of breath Continue oxygen at 2 L/m continuous flow Follow with Dr Lamonte Sakai in 2 months or sooner if you have any problems.

## 2015-04-27 NOTE — Patient Instructions (Signed)
Please stop your Atrovent (ipratropium) inhaler for now Start using Stiolto 2 puffs once a day Use pro-air (albuterol) 2 puffs up to every 4 hours if needed for shortness of breath Continue oxygen at 2 L/m continuous flow Follow with Dr Lamonte Sakai in 2 months or sooner if you have any problems.

## 2015-04-27 NOTE — Assessment & Plan Note (Signed)
Good compliance with CPAP with clear clinical improvement. Continue same

## 2015-04-27 NOTE — Progress Notes (Signed)
Subjective:    Patient ID: Lance Powell, male    DOB: 10/08/1929, 82 y.o.   MRN: [id number]  HPI 82 year old man with a history of former tobacco use (120 pack years), COPD and obstructive sleep apnea. He's been previously followed by Dr [name]. He is on O2 2L/min continuous. He is very reliable about CPAP use, benefits from it. Compliance on download was 99%, 98% > 4 hours. Resultant AHI 2.3. He exacerbates . He was started by T Parrett on stiolto but he can't recall whether he ever took it or whether he benefited.  He does not recall any recent exacerbations. He was treated for PNA before but he cannot recall the details.   CAT Score 04/27/2015  Total CAT Score 16    Review of Systems As per HPI  Past Medical History  Diagnosis Date  . Osteoarthritis   . Rheumatoid arthritis(714.0)   . Hyperlipemia   . Diabetes mellitus   . COPD (chronic obstructive pulmonary disease) ([place])   . Sleep apnea     cpap  . Hypertension   . Depression 03/08/2014  . GERD (gastroesophageal reflux disease) 03/08/2014  . OSA (obstructive sleep apnea)   . History of blood transfusion      Family History  Problem Relation Age of Onset  . Emphysema Brother   . Heart disease Mother   . Cancer Daughter     breast     Social History   Social History  . Marital Status: Married    Spouse Name: N/A  . Number of Children: 6  . Years of Education: N/A   Occupational History  . Retired     Chief Financial Officer   Social History Main Topics  . Smoking status: Former Smoker -- 4.00 packs/day for 30 years    Types: Cigarettes    Quit date: 01/21/1978  . Smokeless tobacco: Never Used  . Alcohol Use: No  . Drug Use: No  . Sexual Activity: Not on file   Other Topics Concern  . Not on file   Social History Narrative  He has worked as an Chief Financial Officer for [employer], [employer], retired Multimedia programmer.  Was in [employer], financing office   No Known Allergies   Outpatient Prescriptions Prior to Visit  Medication Sig  Dispense Refill  . albuterol (PROVENTIL HFA;VENTOLIN HFA) 108 (90 BASE) MCG/ACT inhaler Inhale 2 puffs into the lungs every 6 (six) hours as needed for wheezing or shortness of breath. 1 Inhaler 2  . aspirin 81 MG tablet Take 1 tablet (81 mg total) by mouth every morning. Stop for 1 week and then resume (Patient taking differently: Take 81 mg by mouth every morning. )    . atorvastatin (LIPITOR) 40 MG tablet Take 1 tablet (40 mg total) by mouth every morning. 90 tablet 1  . celecoxib (CELEBREX) 200 MG capsule Take 1 capsule (200 mg total) by mouth every evening. Hold for the next 5 days and then resume    . cholecalciferol (VITAMIN D) 1000 UNITS tablet Take 2,000 Units by mouth 2 (two) times daily.     . clonazePAM (KLONOPIN) 0.25 MG disintegrating tablet 1 tab po q hs prn insomnia or anxiety 10 tablet 2  . doxycycline (VIBRA-TABS) 100 MG tablet Take 1 tablet (100 mg total) by mouth 2 (two) times daily. 20 tablet 0  . enalapril (VASOTEC) 2.5 MG tablet Take 2.5 mg by mouth every morning. 06/13/14 STATES NOT TAKING AT PRESENT    . Indacaterol Maleate (ARCAPTA NEOHALER) 75 MCG  CAPS Place 1 capsule into inhaler and inhale every morning. 90 capsule 1  . ipratropium (ATROVENT HFA) 17 MCG/ACT inhaler 2 inhalation po qid 1 Inhaler 12  . meclizine (ANTIVERT) 25 MG tablet Take 1 tablet (25 mg total) by mouth 3 (three) times daily as needed for dizziness. 20 tablet 0  . metFORMIN (GLUCOPHAGE) 500 MG tablet Take 1 tablet (500 mg total) by mouth 3 (three) times daily. 90 tablet 2  . misoprostol (CYTOTEC) 100 MCG tablet TAKE ONE TABLET BY MOUTH IN THE MORNING, ONE TABLET IN THE EVENING AND TWO TABLETS AT BEDTIME 120 tablet 0  . Multiple Vitamins-Minerals (CENTRUM) tablet Take 1 tablet by mouth every morning. Reported on 01/05/2015    . OVER THE COUNTER MEDICATION Place 1-2 drops into both eyes daily as needed (dry red eyes.). Reported on 01/05/2015    . PARoxetine (PAXIL) 20 MG tablet Take 1 tablet (20 mg total) by  mouth every morning. 90 tablet 1  . Tiotropium Bromide-Olodaterol (STIOLTO RESPIMAT) 2.5-2.5 MCG/ACT AERS Inhale 2 puffs into the lungs daily. 4 g 5  . UNABLE TO FIND CPAP    . vitamin C (ASCORBIC ACID) 500 MG tablet Take 500 mg by mouth every morning.      No facility-administered medications prior to visit.         Objective:   Physical Exam Filed Vitals:   04/27/15 1531  BP: 122/70  Pulse: 114  Height: 6' (1.829 m)  Weight: 256 lb (116.121 kg)  SpO2: 91%   Gen: Pleasant, overwt man, in no distress,  normal affect  ENT: No lesions,  mouth clear,  oropharynx clear, no postnasal drip  Neck: No JVD, no TMG, no carotid bruits  Lungs: No use of accessory muscles,  clear without rales or rhonchi  Cardiovascular: RRR, heart sounds normal, no murmur or gallops, no peripheral edema  Musculoskeletal: No deformities, no cyanosis or clubbing  Neuro: alert, non focal  Skin: Warm, no lesions or rashes      Assessment & Plan:  COPD (chronic obstructive pulmonary disease) Please stop your Atrovent (ipratropium) inhaler for now Start using Stiolto 2 puffs once a day Use pro-air (albuterol) 2 puffs up to every 4 hours if needed for shortness of breath Continue oxygen at 2 L/m continuous flow Follow with Dr Lamonte Sakai in 2 months or sooner if you have any problems.   Obstructive sleep apnea Good compliance with CPAP with clear clinical improvement. Continue same   /0-

## 2015-04-27 NOTE — Addendum Note (Signed)
Addended by: Maryanna Shape A on: 04/27/2015 04:22 PM   Modules accepted: Orders, Medications

## 2015-05-01 DIAGNOSIS — G4733 Obstructive sleep apnea (adult) (pediatric): Secondary | ICD-10-CM | POA: Diagnosis not present

## 2015-05-01 DIAGNOSIS — J449 Chronic obstructive pulmonary disease, unspecified: Secondary | ICD-10-CM | POA: Diagnosis not present

## 2015-05-08 ENCOUNTER — Telehealth (HOSPITAL_COMMUNITY): Payer: Self-pay

## 2015-05-18 ENCOUNTER — Ambulatory Visit: Payer: Medicare Other | Admitting: Audiology

## 2015-05-31 DIAGNOSIS — J449 Chronic obstructive pulmonary disease, unspecified: Secondary | ICD-10-CM | POA: Diagnosis not present

## 2015-05-31 DIAGNOSIS — G4733 Obstructive sleep apnea (adult) (pediatric): Secondary | ICD-10-CM | POA: Diagnosis not present

## 2015-06-05 ENCOUNTER — Other Ambulatory Visit: Payer: Self-pay | Admitting: Medical

## 2015-06-28 ENCOUNTER — Encounter: Payer: Self-pay | Admitting: Medical

## 2015-06-28 ENCOUNTER — Ambulatory Visit (INDEPENDENT_AMBULATORY_CARE_PROVIDER_SITE_OTHER): Payer: Medicare Other | Admitting: Medical

## 2015-06-28 VITALS — BP 122/78 | HR 101 | Temp 97.5°F | Ht <= 58 in | Wt 257.0 lb

## 2015-06-28 DIAGNOSIS — E119 Type 2 diabetes mellitus without complications: Secondary | ICD-10-CM | POA: Diagnosis not present

## 2015-06-28 DIAGNOSIS — R195 Other fecal abnormalities: Secondary | ICD-10-CM

## 2015-06-28 DIAGNOSIS — J441 Chronic obstructive pulmonary disease with (acute) exacerbation: Secondary | ICD-10-CM | POA: Diagnosis not present

## 2015-06-28 DIAGNOSIS — M545 Low back pain, unspecified: Secondary | ICD-10-CM

## 2015-06-28 MED ORDER — LIDOCAINE 5 % EX PTCH: 1.0000 | MEDICATED_PATCH | CUTANEOUS | Status: DC

## 2015-06-28 MED ORDER — CANAGLIFLOZIN 100 MG PO TABS: 100.0000 mg | ORAL_TABLET | Freq: Every day | ORAL | Status: DC

## 2015-06-28 NOTE — Progress Notes (Signed)
Pre visit review using our clinic tool,if applicable. No additional management support is needed unless otherwise documented below in the visit note.  

## 2015-06-28 NOTE — Progress Notes (Signed)
Subjective:    Patient ID: Lance Powell, male    DOB: Oct 19, 1929, 82 y.o.   MRN: [id number]  HPI  Pt in for follow up. Pt states he is feeling ok.   Pt states that he has some back pain lower area(intermittent and mild). He had l3-l4 region pain for 20 years. Pt used to see chiropracter in the past. Pt has no pain shooting to his legs. No saddle anesthesia. No leg weakness or foot drop. Pt alternates laying on his side. If he takes 1 alleve twice a day and pain will subside. Pt gfr has been over 60 consistently.  Pt also mentions that he may get diarrhea 1-2 very loose stools about every 2 weeks. Then it will go away. Pt thought maybe related to fiber intake. But he stopped extra fiber and it did not help  Pt saw lung specialist in past ans will see him in 2 weeks. Pt still on oxygen.  Pt has diabetes a1-c of 8.2 on his last visit. He is on metformin. I had planned to add invokana.     Review of Systems  Constitutional: Negative for fever, chills and fatigue.  Respiratory: Negative for cough, chest tightness and wheezing.   Cardiovascular: Negative for chest pain and palpitations.  Gastrointestinal: Negative for nausea, abdominal pain, diarrhea, blood in stool and abdominal distention.  Genitourinary: Negative for frequency, penile swelling, enuresis and difficulty urinating.  Musculoskeletal: Positive for back pain.  Skin: Negative for rash.  Neurological: Negative for dizziness and headaches.  Hematological: Negative for adenopathy. Does not bruise/bleed easily.  Psychiatric/Behavioral: Negative for behavioral problems, confusion and dysphoric mood. The patient is not nervous/anxious.     Past Medical History  Diagnosis Date  . Osteoarthritis   . Rheumatoid arthritis(714.0)   . Hyperlipemia   . Diabetes mellitus   . COPD (chronic obstructive pulmonary disease) ([place])   . Sleep apnea     cpap  . Hypertension   . Depression 03/08/2014  . GERD (gastroesophageal reflux  disease) 03/08/2014  . OSA (obstructive sleep apnea)   . History of blood transfusion      Social History   Social History  . Marital Status: Married    Spouse Name: N/A  . Number of Children: 6  . Years of Education: N/A   Occupational History  . Retired     Chief Financial Officer   Social History Main Topics  . Smoking status: Former Smoker -- 4.00 packs/day for 30 years    Types: Cigarettes    Quit date: 01/21/1978  . Smokeless tobacco: Never Used  . Alcohol Use: No  . Drug Use: No  . Sexual Activity: Not on file   Other Topics Concern  . Not on file   Social History Narrative    Past Surgical History  Procedure Laterality Date  . Appendectomy  1969  . Nissen fundoplication    . Hernia repair  1990    right and left inguinal  . Vasectomy  1972  . Tonsillectomy  1937  . Transurethral resection of prostate  01/23/2011    Procedure: TRANSURETHRAL RESECTION OF THE PROSTATE WITH GYRUS INSTRUMENTS;  Surgeon: Molli Hazard, MD;  Location[facility];  Service: Urology;  Laterality: N/A;  . Cystoscopy  01/23/2011    Procedure: CYSTOSCOPY;  Surgeon: Molli Hazard, MD;  Location: [facility];  Service: Urology;  Laterality: N/A;  . Flexible sigmoidoscopy  01/07/2012    Procedure: FLEXIBLE SIGMOIDOSCOPY;  Surgeon: Pricilla Riffle  Fuller Plan, MD,FACG;  Location: [facility];  Service: Endoscopy;  Laterality: N/A;  . Sigmoidectomy  2004    colovesical fistula  . Cholecystectomy N/A 06/16/2014    Procedure: LAPAROSCOPIC CHOLECYSTECTOMY;  Surgeon: Greer Pickerel, MD;  Location: [facility];  Service: General;  Laterality: N/A;    Family History  Problem Relation Age of Onset  . Emphysema Brother   . Heart disease Mother   . Cancer Daughter     breast    No Known Allergies  Current Outpatient Prescriptions on File Prior to Visit  Medication Sig Dispense Refill  . albuterol (PROVENTIL HFA;VENTOLIN HFA) 108 (90 BASE) MCG/ACT inhaler Inhale 2 puffs into the  lungs every 6 (six) hours as needed for wheezing or shortness of breath. 1 Inhaler 2  . aspirin 81 MG tablet Take 1 tablet (81 mg total) by mouth every morning. Stop for 1 week and then resume (Patient taking differently: Take 81 mg by mouth every morning. )    . atorvastatin (LIPITOR) 40 MG tablet Take 1 tablet (40 mg total) by mouth every morning. 90 tablet 1  . celecoxib (CELEBREX) 200 MG capsule Take 1 capsule (200 mg total) by mouth every evening. Hold for the next 5 days and then resume    . cholecalciferol (VITAMIN D) 1000 UNITS tablet Take 2,000 Units by mouth 2 (two) times daily.     . clonazePAM (KLONOPIN) 0.25 MG disintegrating tablet 1 tab po q hs prn insomnia or anxiety 10 tablet 2  . doxycycline (VIBRA-TABS) 100 MG tablet Take 1 tablet (100 mg total) by mouth 2 (two) times daily. 20 tablet 0  . enalapril (VASOTEC) 2.5 MG tablet Take 2.5 mg by mouth every morning. 06/13/14 STATES NOT TAKING AT PRESENT    . Indacaterol Maleate (ARCAPTA NEOHALER) 75 MCG CAPS Place 1 capsule into inhaler and inhale every morning. 90 capsule 1  . meclizine (ANTIVERT) 25 MG tablet Take 1 tablet (25 mg total) by mouth 3 (three) times daily as needed for dizziness. 20 tablet 0  . metFORMIN (GLUCOPHAGE) 500 MG tablet Take 1 tablet (500 mg total) by mouth 3 (three) times daily. 90 tablet 2  . misoprostol (CYTOTEC) 100 MCG tablet TAKE ONE TABLET BY MOUTH ONCE DAILY IN THE MORNING, ONE TABLET IN THE EVENING, AND TWO TABLETS AT BEDTIME 120 tablet 0  . Multiple Vitamins-Minerals (CENTRUM) tablet Take 1 tablet by mouth every morning. Reported on 01/05/2015    . OVER THE COUNTER MEDICATION Place 1-2 drops into both eyes daily as needed (dry red eyes.). Reported on 01/05/2015    . PARoxetine (PAXIL) 20 MG tablet Take 1 tablet (20 mg total) by mouth every morning. 90 tablet 1  . Tiotropium Bromide-Olodaterol (STIOLTO RESPIMAT) 2.5-2.5 MCG/ACT AERS Inhale 2 puffs into the lungs daily. 4 g 5  . Tiotropium Bromide-Olodaterol  (STIOLTO RESPIMAT) 2.5-2.5 MCG/ACT AERS Inhale 2 puffs into the lungs daily. 4 g 2  . UNABLE TO FIND CPAP    . vitamin C (ASCORBIC ACID) 500 MG tablet Take 500 mg by mouth every morning.      No current facility-administered medications on file prior to visit.    BP 122/78 mmHg  Pulse 113  Temp(Src) 97.5 F (36.4 C) (Oral)  Ht 6" (0.152 m)  Wt 257 lb (116.574 kg)  BMI 5045.62 kg/m2  SpO2 94%       Objective:   Physical Exam  General Appearance- Not in acute distress.    Chest and Lung Exam Auscultation: Breath sounds:-Normal. Clear  even and unlabored. Adventitious sounds:- No Adventitious sounds.  Cardiovascular Auscultation:Rythm - Regular, rate and rythm. Heart Sounds -Normal heart sounds.  Abdomen Inspection:-Inspection Normal.  Palpation/Perucssion: Palpation and Percussion of the abdomen reveal- Non Tender, No Rebound tenderness, No rigidity(Guarding) and No Palpable abdominal masses.  Liver:-Normal.  Spleen:- Normal.   Back Mid lumbar spine tenderness to palpation.   Lower ext neurologic  L5-S1 sensation intact bilaterally. No foot drop bilaterally. 5/5 equal and symmetric lower ext strength bilaterally.      Assessment & Plan:  For your diabetes continue metformin and will rx invokona. Will get a1-c and cmp.   For your low back pain continue celebrex. Rx lidocaine patch. If too expensive then get otc.(note did advise not to use alleve since he is already on celebrex. Pt verbalized understanding.)   Your loose stools area very rare an intermittent. I don't think you need aggressive treatment or testing. If you do get on occasion. You could try one immodium at most. But if more recurrent and daily type loose stools 3 or more then recommend stool panel studies.  For copd continue regimen pulmonologist has advised.  Follow up in 3 months or as needed

## 2015-06-28 NOTE — Patient Instructions (Addendum)
For your diabetes continue metformin and will rx invokona. Will get a1-c and cmp.   For your low back pain continue celebrex. Rx lidocaine patch. If too expensive then get otc.   Your loose stools area very rare an intermittent. I don't think you need aggressive treatment or testing. If you do get on occasion. You could try one immodium at most. But if more recurrent and daily type loose stools 3 or more then recommend stool panel studies.  For copd continue regimen pulmonologist has advised.  Follow up in 3 months or as needed

## 2015-06-29 LAB — COMPREHENSIVE METABOLIC PANEL
ALBUMIN: 4 g/dL (ref 3.5–5.2)
ALK PHOS: 76 U/L (ref 39–117)
ALT: 40 U/L (ref 0–53)
AST: 27 U/L (ref 0–37)
BUN: 16 mg/dL (ref 6–23)
CO2: 31 mEq/L (ref 19–32)
Calcium: 10 mg/dL (ref 8.4–10.5)
Chloride: 104 mEq/L (ref 96–112)
Creatinine, Ser: 1.11 mg/dL (ref 0.40–1.50)
GFR: 67.12 mL/min (ref >60.00)
Glucose, Bld: 240 mg/dL — ABNORMAL HIGH (ref 70–99)
POTASSIUM: 4 meq/L (ref 3.5–5.1)
Sodium: 141 mEq/L (ref 135–145)
TOTAL PROTEIN: 6.7 g/dL (ref 6.0–8.3)
Total Bilirubin: 0.5 mg/dL (ref 0.2–1.2)

## 2015-06-29 LAB — HEMOGLOBIN A1C: HEMOGLOBIN A1C: 7.6 % — AB (ref 4.6–6.5)

## 2015-07-01 DIAGNOSIS — G4733 Obstructive sleep apnea (adult) (pediatric): Secondary | ICD-10-CM | POA: Diagnosis not present

## 2015-07-01 DIAGNOSIS — J449 Chronic obstructive pulmonary disease, unspecified: Secondary | ICD-10-CM | POA: Diagnosis not present

## 2015-07-03 ENCOUNTER — Telehealth: Payer: Self-pay | Admitting: *Deleted

## 2015-07-03 ENCOUNTER — Other Ambulatory Visit: Payer: Self-pay | Admitting: Medical

## 2015-07-03 NOTE — Telephone Encounter (Signed)
PA initiated on [email or web address], awaiting determination. JG//CMA 

## 2015-07-03 NOTE — Telephone Encounter (Signed)
PA approved effective from 07/03/2015 through 07/02/2016. JG//CMA

## 2015-07-12 ENCOUNTER — Encounter: Payer: Self-pay | Admitting: Emergency Medicine

## 2015-07-12 ENCOUNTER — Ambulatory Visit (INDEPENDENT_AMBULATORY_CARE_PROVIDER_SITE_OTHER): Payer: Medicare Other | Admitting: Emergency Medicine

## 2015-07-12 VITALS — BP 134/78 | HR 108 | Ht 72.0 in | Wt 252.0 lb

## 2015-07-12 DIAGNOSIS — G4733 Obstructive sleep apnea (adult) (pediatric): Secondary | ICD-10-CM | POA: Diagnosis not present

## 2015-07-12 DIAGNOSIS — J438 Other emphysema: Secondary | ICD-10-CM

## 2015-07-12 NOTE — Patient Instructions (Signed)
Please continue CPAP every night as you have been wearing it Continue your oxygen at 2 L/m Continue to use pro-air 2 puffs as needed for shortness of breath We will not start any new inhalers at this time Get the flu shot in the fall Follow with Dr Lamonte Sakai in 6 months or sooner if you have any problems

## 2015-07-12 NOTE — Assessment & Plan Note (Signed)
We repeated a trial of Stiolto although he only took it for 5 days. As expected there was no response after such short time. He does not want to start a maintenance regimen at this time. He feels that his breathing is doing well and he is only using pro-air rarely. We will continue this regimen, reassess every several months. Continue oxygen 2 L/m

## 2015-07-12 NOTE — Progress Notes (Signed)
Subjective:    Patient ID: Lance Powell, male    DOB: 10-08-1929, 82 y.o.   MRN: [id number]  HPI 82 year old man with a history of former tobacco use (120 pack years), COPD and obstructive sleep apnea. He's been previously followed by Dr [name]. He is on O2 2L/min continuous. He is very reliable about CPAP use, benefits from it. Compliance on download was 99%, 98% > 4 hours. Resultant AHI 2.3. He exacerbates . He was started by T Parrett on stiolto but he can't recall whether he ever took it or whether he benefited.  He does not recall any recent exacerbations. He was treated for PNA before but he cannot recall the details.   ROV 07/12/15 -- follow-up visit for patient with obstructive sleep apnea on CPAP as well as COPD and chronic hypoxemic respiratory failure. At his last visit we did a trial of starting Stiolto - he does not believe that he has benefited, used it for 5 days. He didn't feel that it helped him. He is using Pro-Air prn, once a week. Minimal cough, minimal dyspnea. He is wearing oxygen at 2 L/m at all times.  CAT Score 04/27/2015  Total CAT Score 16    Review of Systems As per HPI  Past Medical History  Diagnosis Date  . Osteoarthritis   . Rheumatoid arthritis(714.0)   . Hyperlipemia   . Diabetes mellitus   . COPD (chronic obstructive pulmonary disease) ([place])   . Sleep apnea     cpap  . Hypertension   . Depression 03/08/2014  . GERD (gastroesophageal reflux disease) 03/08/2014  . OSA (obstructive sleep apnea)   . History of blood transfusion      Family History  Problem Relation Age of Onset  . Emphysema Brother   . Heart disease Mother   . Cancer Daughter     breast     Social History   Social History  . Marital Status: Married    Spouse Name: N/A  . Number of Children: 6  . Years of Education: N/A   Occupational History  . Retired     Chief Financial Officer   Social History Main Topics  . Smoking status: Former Smoker -- 4.00 packs/day for 30 years   Types: Cigarettes    Quit date: 01/21/1978  . Smokeless tobacco: Never Used  . Alcohol Use: No  . Drug Use: No  . Sexual Activity: Not on file   Other Topics Concern  . Not on file   Social History Narrative  He has worked as an Chief Financial Officer for [employer], [employer], retired Multimedia programmer.  Was in [employer], financing office   No Known Allergies   Outpatient Prescriptions Prior to Visit  Medication Sig Dispense Refill  . albuterol (PROVENTIL HFA;VENTOLIN HFA) 108 (90 BASE) MCG/ACT inhaler Inhale 2 puffs into the lungs every 6 (six) hours as needed for wheezing or shortness of breath. 1 Inhaler 2  . aspirin 81 MG tablet Take 1 tablet (81 mg total) by mouth every morning. Stop for 1 week and then resume (Patient taking differently: Take 81 mg by mouth every morning. )    . atorvastatin (LIPITOR) 40 MG tablet Take 1 tablet (40 mg total) by mouth every morning. 90 tablet 1  . canagliflozin (INVOKANA) 100 MG TABS tablet Take 1 tablet (100 mg total) by mouth daily before breakfast. 30 tablet 3  . celecoxib (CELEBREX) 200 MG capsule Take 1 capsule (200 mg total) by mouth every evening. Hold for the next 5 days  and then resume    . cholecalciferol (VITAMIN D) 1000 UNITS tablet Take 2,000 Units by mouth 2 (two) times daily.     . clonazePAM (KLONOPIN) 0.25 MG disintegrating tablet 1 tab po q hs prn insomnia or anxiety 10 tablet 2  . Indacaterol Maleate (ARCAPTA NEOHALER) 75 MCG CAPS Place 1 capsule into inhaler and inhale every morning. 90 capsule 1  . lidocaine (LIDODERM) 5 % Place 1 patch onto the skin daily. Remove & Discard patch within 12 hours or as directed by MD 30 patch 0  . meclizine (ANTIVERT) 25 MG tablet Take 1 tablet (25 mg total) by mouth 3 (three) times daily as needed for dizziness. 20 tablet 0  . metFORMIN (GLUCOPHAGE) 500 MG tablet TAKE ONE TABLET BY MOUTH THREE TIMES DAILY 90 tablet 2  . misoprostol (CYTOTEC) 100 MCG tablet TAKE ONE TABLET BY MOUTH ONCE DAILY IN THE MORNING, ONE TABLET IN THE  EVENING AND TWO TABLETS AT BEDTIME 120 tablet 2  . Multiple Vitamins-Minerals (CENTRUM) tablet Take 1 tablet by mouth every morning. Reported on 01/05/2015    . OVER THE COUNTER MEDICATION Place 1-2 drops into both eyes daily as needed (dry red eyes.). Reported on 01/05/2015    . PARoxetine (PAXIL) 20 MG tablet Take 1 tablet (20 mg total) by mouth every morning. 90 tablet 1  . UNABLE TO FIND CPAP    . vitamin C (ASCORBIC ACID) 500 MG tablet Take 500 mg by mouth every morning.     . enalapril (VASOTEC) 2.5 MG tablet Take 2.5 mg by mouth every morning. Reported on 07/12/2015    . Tiotropium Bromide-Olodaterol (STIOLTO RESPIMAT) 2.5-2.5 MCG/ACT AERS Inhale 2 puffs into the lungs daily. (Patient not taking: Reported on 07/12/2015) 4 g 5  . Tiotropium Bromide-Olodaterol (STIOLTO RESPIMAT) 2.5-2.5 MCG/ACT AERS Inhale 2 puffs into the lungs daily. (Patient not taking: Reported on 07/12/2015) 4 g 2  . doxycycline (VIBRA-TABS) 100 MG tablet Take 1 tablet (100 mg total) by mouth 2 (two) times daily. (Patient not taking: Reported on 07/12/2015) 20 tablet 0   No facility-administered medications prior to visit.         Objective:   Physical Exam Filed Vitals:   07/12/15 1426  BP: 134/78  Pulse: 108  Height: 6' (1.829 m)  Weight: 252 lb (114.306 kg)  SpO2: 91%   Gen: Pleasant, overwt man, in no distress,  normal affect  ENT: No lesions,  mouth clear,  oropharynx clear, no postnasal drip  Neck: No JVD, no TMG, no carotid bruits  Lungs: No use of accessory muscles,  clear without rales or rhonchi  Cardiovascular: RRR, heart sounds normal, no murmur or gallops, no peripheral edema  Musculoskeletal: No deformities, no cyanosis or clubbing  Neuro: alert, non focal  Skin: Warm, no lesions or rashes      Assessment & Plan:  COPD (chronic obstructive pulmonary disease) We repeated a trial of Stiolto although he only took it for 5 days. As expected there was no response after such short time. He  does not want to start a maintenance regimen at this time. He feels that his breathing is doing well and he is only using pro-air rarely. We will continue this regimen, reassess every several months. Continue oxygen 2 L/m  Obstructive sleep apnea Continue CPAP daily at bedtime  [name], MD, PhD 07/12/2015, 2:47 PM [facility] [phone] or if no answer [phone]

## 2015-07-12 NOTE — Assessment & Plan Note (Signed)
Continue CPAP daily at bedtime 

## 2015-07-17 ENCOUNTER — Telehealth: Payer: Self-pay | Admitting: Emergency Medicine

## 2015-07-17 NOTE — Telephone Encounter (Signed)
This form was a plate not a placard. It was signed by MW. This has been given back to the pt. Nothing further was needed.

## 2015-07-23 ENCOUNTER — Other Ambulatory Visit: Payer: Self-pay | Admitting: Medical

## 2015-07-31 DIAGNOSIS — J449 Chronic obstructive pulmonary disease, unspecified: Secondary | ICD-10-CM | POA: Diagnosis not present

## 2015-07-31 DIAGNOSIS — G4733 Obstructive sleep apnea (adult) (pediatric): Secondary | ICD-10-CM | POA: Diagnosis not present

## 2015-08-15 ENCOUNTER — Telehealth: Payer: Self-pay | Admitting: Medical

## 2015-08-15 NOTE — Telephone Encounter (Signed)
Caller name: Relationship to patient: Self Can be reached: [phone]  Pharmacy:  [facility], [place] [phone] (Phone) [phone] (Fax)     Reason for call: Refill  celecoxib (CELEBREX) 200 MG capsule [id number]

## 2015-08-17 MED ORDER — CELECOXIB 200 MG PO CAPS: 200.0000 mg | ORAL_CAPSULE | Freq: Every evening | ORAL | 1 refills | Status: DC

## 2015-08-17 NOTE — Telephone Encounter (Signed)
Rx for Celebrex 200 mg capsule filled 08/17/15. Pt has a follow up in September 2017.

## 2015-08-31 DIAGNOSIS — J449 Chronic obstructive pulmonary disease, unspecified: Secondary | ICD-10-CM | POA: Diagnosis not present

## 2015-08-31 DIAGNOSIS — G4733 Obstructive sleep apnea (adult) (pediatric): Secondary | ICD-10-CM | POA: Diagnosis not present

## 2015-09-27 ENCOUNTER — Encounter: Payer: Self-pay | Admitting: Medical

## 2015-09-27 ENCOUNTER — Ambulatory Visit (INDEPENDENT_AMBULATORY_CARE_PROVIDER_SITE_OTHER): Payer: Medicare Other | Admitting: Medical

## 2015-09-27 VITALS — BP 136/70 | HR 106 | Temp 97.8°F | Ht 72.0 in | Wt 253.4 lb

## 2015-09-27 DIAGNOSIS — J449 Chronic obstructive pulmonary disease, unspecified: Secondary | ICD-10-CM | POA: Diagnosis not present

## 2015-09-27 DIAGNOSIS — E119 Type 2 diabetes mellitus without complications: Secondary | ICD-10-CM | POA: Diagnosis not present

## 2015-09-27 DIAGNOSIS — E785 Hyperlipidemia, unspecified: Secondary | ICD-10-CM | POA: Diagnosis not present

## 2015-09-27 DIAGNOSIS — M545 Low back pain, unspecified: Secondary | ICD-10-CM

## 2015-09-27 DIAGNOSIS — Z23 Encounter for immunization: Secondary | ICD-10-CM

## 2015-09-27 MED ORDER — PAROXETINE HCL 20 MG PO TABS: 20.0000 mg | ORAL_TABLET | Freq: Every day | ORAL | 1 refills | Status: DC

## 2015-09-27 MED ORDER — CLONAZEPAM 0.25 MG PO TBDP: ORAL_TABLET | ORAL | 2 refills | Status: DC

## 2015-09-27 NOTE — Progress Notes (Signed)
Subjective:    Patient ID: Lance Powell, male    DOB: 1929/05/03, 82 y.o.   MRN: [id number]  HPI   Pt in for follow up.   He has seen pulmonologist recently. He is on cpap at night and on  2L O2.  Pt is ok with getting flu vaccine today.(Daughter want him to get high dose)  Pt a1-c was 7.6. Pt was on invokana and was taking as free sample. But his sample ran out and now invokana cost him $107.95. His glucose readings he showed me have been in low 100's until he ran out of med.  Pt is still on his metformin.   Pt states his back pain on prior visit is controlled. Controlled periodic use of celebrex and otc lidocaine patch. But not daily  Pt still using paxil. Daughter thinks he still needs clonazepam on occasionally.She indicats at time will get agitated easily. He uses this sparingly. Disussed precautions with him and daughter each time I have written the medication.  Pt is on atorvastatin for his high cholesterol.     Review of Systems  Constitutional: Negative for chills, fatigue and fever.  Respiratory: Negative for cough, choking, chest tightness and wheezing.   Cardiovascular: Negative for palpitations.  Gastrointestinal: Negative for abdominal pain, blood in stool, constipation, diarrhea, nausea and vomiting.  Endocrine: Negative for polydipsia, polyphagia and polyuria.  Genitourinary: Negative for decreased urine volume, dysuria, frequency and hematuria.  Musculoskeletal: Positive for back pain.       See hpi.  Neurological: Negative for dizziness, numbness and headaches.  Hematological: Negative for adenopathy. Does not bruise/bleed easily.  Psychiatric/Behavioral: Positive for agitation and sleep disturbance. Negative for behavioral problems, decreased concentration, dysphoric mood, hallucinations and suicidal ideas. The patient is nervous/anxious.        At times the below.   Past Medical History:  Diagnosis Date  . COPD (chronic obstructive pulmonary  disease) ([place])   . Depression 03/08/2014  . Diabetes mellitus   . GERD (gastroesophageal reflux disease) 03/08/2014  . History of blood transfusion   . Hyperlipemia   . Hypertension   . OSA (obstructive sleep apnea)   . Osteoarthritis   . Rheumatoid arthritis(714.0)   . Sleep apnea    cpap     Social History   Social History  . Marital status: Married    Spouse name: N/A  . Number of children: 6  . Years of education: N/A   Occupational History  . Retired     Chief Financial Officer   Social History Main Topics  . Smoking status: Former Smoker    Packs/day: 4.00    Years: 30.00    Types: Cigarettes    Quit date: 01/21/1978  . Smokeless tobacco: Never Used  . Alcohol use No  . Drug use: No  . Sexual activity: Not on file   Other Topics Concern  . Not on file   Social History Narrative  . No narrative on file    Past Surgical History:  Procedure Laterality Date  . APPENDECTOMY  1969  . CHOLECYSTECTOMY N/A 06/16/2014   Procedure: LAPAROSCOPIC CHOLECYSTECTOMY;  Surgeon: Greer Pickerel, MD;  Location: [facility];  Service: General;  Laterality: N/A;  . CYSTOSCOPY  01/23/2011   Procedure: CYSTOSCOPY;  Surgeon: Molli Hazard, MD;  Location[facility];  Service: Urology;  Laterality: N/A;  . FLEXIBLE SIGMOIDOSCOPY  01/07/2012   Procedure: FLEXIBLE SIGMOIDOSCOPY;  Surgeon: Ladene Artist, MD,FACG;  Location: [facility];  Service: Endoscopy;  Laterality: N/A;  . HERNIA REPAIR  1990   right and left inguinal  . NISSEN FUNDOPLICATION    . 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 TONSILLECTOMY  1937  . TRANSURETHRAL RESECTION OF PROSTATE  01/23/2011   Procedure: TRANSURETHRAL RESECTION OF THE PROSTATE WITH GYRUS INSTRUMENTS;  Surgeon: Molli Hazard, MD;  Location: [facility];  Service: Urology;  Laterality: N/A;  . VASECTOMY  1972    Family History  Problem Relation Age of Onset  . Emphysema Brother   . Heart disease Mother   . Cancer  Daughter     breast    No Known Allergies  Current Outpatient Prescriptions on File Prior to Visit  Medication Sig Dispense Refill  . albuterol (PROVENTIL HFA;VENTOLIN HFA) 108 (90 BASE) MCG/ACT inhaler Inhale 2 puffs into the lungs every 6 (six) hours as needed for wheezing or shortness of breath. 1 Inhaler 2  . aspirin 81 MG tablet Take 1 tablet (81 mg total) by mouth every morning. Stop for 1 week and then resume (Patient taking differently: Take 81 mg by mouth every morning. )    . atorvastatin (LIPITOR) 40 MG tablet TAKE ONE TABLET BY MOUTH ONCE DAILY IN THE MORNING 90 tablet 0  . canagliflozin (INVOKANA) 100 MG TABS tablet Take 1 tablet (100 mg total) by mouth daily before breakfast. 30 tablet 3  . celecoxib (CELEBREX) 200 MG capsule Take 1 capsule (200 mg total) by mouth every evening. 30 capsule 1  . cholecalciferol (VITAMIN D) 1000 UNITS tablet Take 2,000 Units by mouth 2 (two) times daily.     . enalapril (VASOTEC) 2.5 MG tablet Take 2.5 mg by mouth every morning. Reported on 07/12/2015    . Indacaterol Maleate (ARCAPTA NEOHALER) 75 MCG CAPS Place 1 capsule into inhaler and inhale every morning. 90 capsule 1  . lidocaine (LIDODERM) 5 % Place 1 patch onto the skin daily. Remove & Discard patch within 12 hours or as directed by MD 30 patch 0  . meclizine (ANTIVERT) 25 MG tablet Take 1 tablet (25 mg total) by mouth 3 (three) times daily as needed for dizziness. 20 tablet 0  . metFORMIN (GLUCOPHAGE) 500 MG tablet TAKE ONE TABLET BY MOUTH THREE TIMES DAILY 90 tablet 2  . misoprostol (CYTOTEC) 100 MCG tablet TAKE ONE TABLET BY MOUTH ONCE DAILY IN THE MORNING, ONE TABLET IN THE EVENING AND TWO TABLETS AT BEDTIME 120 tablet 2  . Multiple Vitamins-Minerals (CENTRUM) tablet Take 1 tablet by mouth every morning. Reported on 01/05/2015    . OVER THE COUNTER MEDICATION Place 1-2 drops into both eyes daily as needed (dry red eyes.). Reported on 01/05/2015    . Tiotropium Bromide-Olodaterol (STIOLTO  RESPIMAT) 2.5-2.5 MCG/ACT AERS Inhale 2 puffs into the lungs daily. (Patient not taking: Reported on 07/12/2015) 4 g 5  . Tiotropium Bromide-Olodaterol (STIOLTO RESPIMAT) 2.5-2.5 MCG/ACT AERS Inhale 2 puffs into the lungs daily. (Patient not taking: Reported on 07/12/2015) 4 g 2  . UNABLE TO FIND CPAP    . vitamin C (ASCORBIC ACID) 500 MG tablet Take 500 mg by mouth every morning.      No current facility-administered medications on file prior to visit.     BP 136/70   Pulse (!) 106   Temp 97.8 F (36.6 C) (Oral)   Ht 6' (1.829 m)   Wt 253 lb 6.4 oz (114.9 kg)   SpO2 97%   BMI 34.37 kg/m       Objective:  Physical Exam  General Mental Status- Alert. General Appearance- Not in acute distress.   Skin General: Color- Normal Color. Moisture- Normal Moisture.  Neck Carotid Arteries- Normal color. Moisture- Normal Moisture. No carotid bruits. No JVD.  Chest and Lung Exam Auscultation: Breath Sounds:-Normal.  Cardiovascular Auscultation:Rythm- Regular. Murmurs & Other Heart Sounds:Auscultation of the heart reveals- No Murmurs.  Abdomen Inspection:-Inspeection Normal. Palpation/Percussion:Note:No mass. Palpation and Percussion of the abdomen reveal- Non Tender(but looks like ventral hernia present), Non Distended + BS, no rebound or guarding.    Neurologic Cranial Nerve exam:- CN III-XII intact(No nystagmus), symmetric smile. Strength:- 5/5 equal and symmetric strength both upper and lower extremities.  Feet- see quality metrics.      Assessment & Plan:  For your diabetes and high cholesterol will get fasting labs tomorrow in the am.(lipid panel, cmp and a1-c). Urine micro albumin as well.  For copd continue 02 and proair. Meds pulmonologist rx'd.  Decide on management changes for lipids after labs back.  For diabetes. Continue metformin. When a1-c back may need to find replacement for invokana since too costly.  For anxiety will refill your paxil and make some  clonzepam available.(sparingly used in past and counseled again)  Follow up date to be determined after lab review.  Asked daughter to ask about shingles vaccine if it is cover.   Murel Shenberger, [name], PA-C

## 2015-09-27 NOTE — Patient Instructions (Addendum)
For your diabetes and high cholesterol will get fasting labs tomorrow in the am.(lipid panel, cmp and a1-c). Urine micro albumin as well.  For copd continue 02 and proair. Meds pulmonologist rx'd.  Decide on management changes for lipids after labs back.  For diabetes. Continue metformin. When a1-c back may need to find replacement for invokana since too costly.  For anxiety will refill your paxil and make some clonzepam available.(sparingly used in past and counseled again)  Follow up date to be determined after lab review.

## 2015-09-27 NOTE — Progress Notes (Signed)
Pre visit review using our clinic tool,if applicable. No additional management support is needed unless otherwise documented below in the visit note.  

## 2015-09-28 DIAGNOSIS — E119 Type 2 diabetes mellitus without complications: Secondary | ICD-10-CM | POA: Diagnosis not present

## 2015-09-28 LAB — COMPREHENSIVE METABOLIC PANEL
ALT: 47 U/L (ref 0–53)
AST: 32 U/L (ref 0–37)
Albumin: 4.2 g/dL (ref 3.5–5.2)
Alkaline Phosphatase: 73 U/L (ref 39–117)
BILIRUBIN TOTAL: 0.6 mg/dL (ref 0.2–1.2)
BUN: 19 mg/dL (ref 6–23)
CHLORIDE: 105 meq/L (ref 96–112)
CO2: 31 meq/L (ref 19–32)
CREATININE: 1.15 mg/dL (ref 0.40–1.50)
Calcium: 9.7 mg/dL (ref 8.4–10.5)
GFR: 64.39 mL/min (ref >60.00)
GLUCOSE: 146 mg/dL — AB (ref 70–99)
Potassium: 3.5 mEq/L (ref 3.5–5.1)
SODIUM: 140 meq/L (ref 135–145)
Total Protein: 7.1 g/dL (ref 6.0–8.3)

## 2015-09-28 LAB — LIPID PANEL
CHOL/HDL RATIO: 3
Cholesterol: 127 mg/dL (ref 0–200)
HDL: 38.7 mg/dL — ABNORMAL LOW (ref >39.00)
LDL CALC: 71 mg/dL (ref 0–99)
NONHDL: 88.49
TRIGLYCERIDES: 89 mg/dL (ref 0.0–149.0)
VLDL: 17.8 mg/dL (ref 0.0–40.0)

## 2015-09-28 LAB — HEMOGLOBIN A1C: HEMOGLOBIN A1C: 7.5 % — AB (ref 4.6–6.5)

## 2015-09-28 NOTE — Addendum Note (Signed)
Addended by: Peggyann Shoals on: 09/28/2015 08:52 AM   Modules accepted: Orders

## 2015-09-29 LAB — MICROALBUMIN, URINE: MICROALB UR: 1.1 mg/dL

## 2015-10-01 ENCOUNTER — Other Ambulatory Visit: Payer: Self-pay | Admitting: Medical

## 2015-10-01 DIAGNOSIS — J449 Chronic obstructive pulmonary disease, unspecified: Secondary | ICD-10-CM | POA: Diagnosis not present

## 2015-10-01 DIAGNOSIS — G4733 Obstructive sleep apnea (adult) (pediatric): Secondary | ICD-10-CM | POA: Diagnosis not present

## 2015-10-09 ENCOUNTER — Ambulatory Visit (INDEPENDENT_AMBULATORY_CARE_PROVIDER_SITE_OTHER): Payer: Medicare Other | Admitting: Medical

## 2015-10-09 ENCOUNTER — Encounter: Payer: Self-pay | Admitting: Medical

## 2015-10-09 VITALS — BP 122/80 | HR 93 | Temp 97.5°F | Ht 72.0 in | Wt 254.6 lb

## 2015-10-09 DIAGNOSIS — H109 Unspecified conjunctivitis: Secondary | ICD-10-CM | POA: Diagnosis not present

## 2015-10-09 MED ORDER — TOBRAMYCIN 0.3 % OP SOLN: 2.0000 [drp] | Freq: Four times a day (QID) | OPHTHALMIC | 0 refills | Status: DC

## 2015-10-09 NOTE — Progress Notes (Signed)
Subjective:    Patient ID: Lance Powell, male    DOB: 1929/08/16, 82 y.o.   MRN: [id number]  HPI  Pt in with redness to his lt eye that started on Friday. He states it was worse than on Friday. Pt states had some discharge that would form. Lashes would stick together. He states eye feels better today than over weekend. Less red.   No vision changes, no blind spots in visual field.  Pt has no trauma to eye fb. No pressure sensation. No hx of glaucoma. Some cataract  Hx.  Pt has opthalmologist and optometrist. He called today and no appointment was available.  Review of Systems  Constitutional: Negative for chills, fatigue and fever.  Eyes: Positive for discharge and redness. Negative for photophobia, pain, itching and visual disturbance.       Lt eye.  Respiratory: Negative for cough, choking, shortness of breath and wheezing.   Cardiovascular: Negative for chest pain and palpitations.  Neurological: Negative for dizziness, syncope, weakness and headaches.  Hematological: Negative for adenopathy. Does not bruise/bleed easily.  Psychiatric/Behavioral: Negative for behavioral problems, confusion and dysphoric mood.    Past Medical History:  Diagnosis Date  . COPD (chronic obstructive pulmonary disease) ([place])   . Depression 03/08/2014  . Diabetes mellitus   . GERD (gastroesophageal reflux disease) 03/08/2014  . History of blood transfusion   . Hyperlipemia   . Hypertension   . OSA (obstructive sleep apnea)   . Osteoarthritis   . Rheumatoid arthritis(714.0)   . Sleep apnea    cpap     Social History   Social History  . Marital status: Married    Spouse name: N/A  . Number of children: 6  . Years of education: N/A   Occupational History  . Retired     Chief Financial Officer   Social History Main Topics  . Smoking status: Former Smoker    Packs/day: 4.00    Years: 30.00    Types: Cigarettes    Quit date: 01/21/1978  . Smokeless tobacco: Never Used  . Alcohol use No  . Drug  use: No  . Sexual activity: Not on file   Other Topics Concern  . Not on file   Social History Narrative  . No narrative on file    Past Surgical History:  Procedure Laterality Date  . APPENDECTOMY  1969  . CHOLECYSTECTOMY N/A 06/16/2014   Procedure: LAPAROSCOPIC CHOLECYSTECTOMY;  Surgeon: Greer Pickerel, MD;  Location: [facility];  Service: General;  Laterality: N/A;  . CYSTOSCOPY  01/23/2011   Procedure: CYSTOSCOPY;  Surgeon: Molli Hazard, MD;  [facility];  Service: Urology;  Laterality: N/A;  . FLEXIBLE SIGMOIDOSCOPY  01/07/2012   Procedure: FLEXIBLE SIGMOIDOSCOPY;  Surgeon: Ladene Artist, MD,FACG;  Location: [facility];  Service: Endoscopy;  Laterality: N/A;  . HERNIA REPAIR  1990   right and left inguinal  . NISSEN FUNDOPLICATION    . 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 TONSILLECTOMY  1937  . TRANSURETHRAL RESECTION OF PROSTATE  01/23/2011   Procedure: TRANSURETHRAL RESECTION OF THE PROSTATE WITH GYRUS INSTRUMENTS;  Surgeon: Molli Hazard, MD;  Location: [facility];  Service: Urology;  Laterality: N/A;  . VASECTOMY  1972    Family History  Problem Relation Age of Onset  . Emphysema Brother   . Heart disease Mother   . Cancer Daughter     breast    No Known Allergies  Current Outpatient Prescriptions  on File Prior to Visit  Medication Sig Dispense Refill  . albuterol (PROVENTIL HFA;VENTOLIN HFA) 108 (90 BASE) MCG/ACT inhaler Inhale 2 puffs into the lungs every 6 (six) hours as needed for wheezing or shortness of breath. 1 Inhaler 2  . aspirin 81 MG tablet Take 1 tablet (81 mg total) by mouth every morning. Stop for 1 week and then resume (Patient taking differently: Take 81 mg by mouth every morning. )    . atorvastatin (LIPITOR) 40 MG tablet TAKE ONE TABLET BY MOUTH ONCE DAILY IN THE MORNING 90 tablet 0  . canagliflozin (INVOKANA) 100 MG TABS tablet Take 1 tablet (100 mg total) by mouth daily before  breakfast. 30 tablet 3  . celecoxib (CELEBREX) 200 MG capsule Take 1 capsule (200 mg total) by mouth every evening. 30 capsule 1  . cholecalciferol (VITAMIN D) 1000 UNITS tablet Take 2,000 Units by mouth 2 (two) times daily.     . clonazePAM (KLONOPIN) 0.25 MG disintegrating tablet 1 tab po q hs prn insomnia or anxiety 10 tablet 2  . enalapril (VASOTEC) 2.5 MG tablet Take 2.5 mg by mouth every morning. Reported on 07/12/2015    . Indacaterol Maleate (ARCAPTA NEOHALER) 75 MCG CAPS Place 1 capsule into inhaler and inhale every morning. 90 capsule 1  . lidocaine (LIDODERM) 5 % Place 1 patch onto the skin daily. Remove & Discard patch within 12 hours or as directed by MD 30 patch 0  . meclizine (ANTIVERT) 25 MG tablet Take 1 tablet (25 mg total) by mouth 3 (three) times daily as needed for dizziness. 20 tablet 0  . metFORMIN (GLUCOPHAGE) 500 MG tablet TAKE ONE TABLET BY MOUTH THREE TIMES DAILY 90 tablet 2  . misoprostol (CYTOTEC) 100 MCG tablet TAKE ONE TABLET BY MOUTH ONCE DAILY IN THE MORNING, ONE TABLET IN THE EVENING, AND TWO TABLETS ONCE DAILY AT BEDTIME 120 tablet 2  . Multiple Vitamins-Minerals (CENTRUM) tablet Take 1 tablet by mouth every morning. Reported on 01/05/2015    . OVER THE COUNTER MEDICATION Place 1-2 drops into both eyes daily as needed (dry red eyes.). Reported on 01/05/2015    . PARoxetine (PAXIL) 20 MG tablet Take 1 tablet (20 mg total) by mouth daily. 90 tablet 1  . Tiotropium Bromide-Olodaterol (STIOLTO RESPIMAT) 2.5-2.5 MCG/ACT AERS Inhale 2 puffs into the lungs daily. 4 g 5  . Tiotropium Bromide-Olodaterol (STIOLTO RESPIMAT) 2.5-2.5 MCG/ACT AERS Inhale 2 puffs into the lungs daily. 4 g 2  . UNABLE TO FIND CPAP    . vitamin C (ASCORBIC ACID) 500 MG tablet Take 500 mg by mouth every morning.      No current facility-administered medications on file prior to visit.     BP 122/80   Pulse (!) 110   Temp 97.5 F (36.4 C) (Oral)   Ht 6' (1.829 m)   Wt 254 lb 9.6 oz (115.5 kg)    SpO2 96%   BMI 34.53 kg/m       Objective:   Physical Exam  General- No acute distress. Pleasant patient. Neck- Full range of motion, no jvd Lungs- Clear, even and unlabored. Heart- regular rate and rhythm. Neurologic- CNII- XII grossly intact.  Lt eye- mild red  injected conjunctiva, faint dry crust in left upper eye lid.  Fluroscein exam negative for abrasion.   Non dilated opthalmoscope did not reveal acute abnormality      Assessment & Plan:  You appear to have conjunctivitis. Probable  bacterial. Rx tobrex drops.  Follow up  on Wednesday early am or early afternoon. If eye not significantly improved then refer back to your eye specialist.  If any new symptoms before wed notify us as well and would refer you back to them quicker.

## 2015-10-09 NOTE — Patient Instructions (Addendum)
You appear to have conjunctivitis. Probable  bacterial. Rx tobrex drops.  Follow up on Wednesday early am or early afternoon. If eye not significantly improved then refer back to your eye specialist.  If any new symptoms before wed notify us as well and would refer you back to them quicker.

## 2015-10-11 ENCOUNTER — Ambulatory Visit (INDEPENDENT_AMBULATORY_CARE_PROVIDER_SITE_OTHER): Payer: Medicare Other | Admitting: Medical

## 2015-10-11 ENCOUNTER — Encounter: Payer: Self-pay | Admitting: Medical

## 2015-10-11 VITALS — BP 105/69 | HR 108 | Temp 98.0°F | Resp 18 | Ht 72.0 in | Wt 251.8 lb

## 2015-10-11 DIAGNOSIS — H579 Unspecified disorder of eye and adnexa: Secondary | ICD-10-CM | POA: Diagnosis not present

## 2015-10-11 DIAGNOSIS — H109 Unspecified conjunctivitis: Secondary | ICD-10-CM | POA: Diagnosis not present

## 2015-10-11 DIAGNOSIS — H1033 Unspecified acute conjunctivitis, bilateral: Secondary | ICD-10-CM | POA: Diagnosis not present

## 2015-10-11 NOTE — Patient Instructions (Addendum)
Your left eye overall looks better on exam. However the new onset pressure to eye(moderate-severe pressure on light palpation of upper lid)e since yesterday is a concern.  I want Eye MD to see you today or tomorrow morning at the latest. If scheduled for tomorrow am but your pressure worsens or new signs or symptoms then ED evaluation.

## 2015-10-11 NOTE — Progress Notes (Signed)
Subjective:    Patient ID: Lance Powell, male    DOB: 1929/06/13, 82 y.o.   MRN: [id number]  HPI   Pt in states his eye feels fine. Pt has not had any matting or sticking to his lids. No eye pain. No vision changes, no blind spots in visual field.  Pt has no trauma to eye fb.   No hx of glaucoma. Some cataract  Hx.  Pt thought yesterday thought mild pressure to his eye. States still some pressure.This differs from stated above when stated eye felt fine.   Review of Systems  Constitutional: Negative for chills, fatigue and fever.  Eyes: Negative for photophobia, discharge, redness and visual disturbance.       Yesterday felt mild pressure sensation.  Respiratory: Negative for cough, chest tightness, shortness of breath and wheezing.   Cardiovascular: Negative for chest pain and palpitations.  Gastrointestinal: Negative for abdominal pain.  Musculoskeletal: Negative for back pain.  Skin: Negative for rash.  Neurological: Negative for dizziness and headaches.  Hematological: Negative for adenopathy. Does not bruise/bleed easily.  Psychiatric/Behavioral: Negative for behavioral problems and confusion.    Past Medical History:  Diagnosis Date  . COPD (chronic obstructive pulmonary disease) ([place])   . Depression 03/08/2014  . Diabetes mellitus   . GERD (gastroesophageal reflux disease) 03/08/2014  . History of blood transfusion   . Hyperlipemia   . Hypertension   . OSA (obstructive sleep apnea)   . Osteoarthritis   . Rheumatoid arthritis(714.0)   . Sleep apnea    cpap     Social History   Social History  . Marital status: Married    Spouse name: N/A  . Number of children: 6  . Years of education: N/A   Occupational History  . Retired     Chief Financial Officer   Social History Main Topics  . Smoking status: Former Smoker    Packs/day: 4.00    Years: 30.00    Types: Cigarettes    Quit date: 01/21/1978  . Smokeless tobacco: Never Used  . Alcohol use No  . Drug use: No    . Sexual activity: Not on file   Other Topics Concern  . Not on file   Social History Narrative  . No narrative on file    Past Surgical History:  Procedure Laterality Date  . APPENDECTOMY  1969  . CHOLECYSTECTOMY N/A 06/16/2014   Procedure: LAPAROSCOPIC CHOLECYSTECTOMY;  Surgeon: Greer Pickerel, MD;  Location: [facility];  Service: General;  Laterality: N/A;  . CYSTOSCOPY  01/23/2011   Procedure: CYSTOSCOPY;  Surgeon: Molli Hazard, MD;  Location: [facility];  Service: Urology;  Laterality: N/A;  . FLEXIBLE SIGMOIDOSCOPY  01/07/2012   Procedure: FLEXIBLE SIGMOIDOSCOPY;  Surgeon: Ladene Artist, MD,FACG;  Location: [facility];  Service: Endoscopy;  Laterality: N/A;  . HERNIA REPAIR  1990   right and left inguinal  . NISSEN FUNDOPLICATION    . 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 TONSILLECTOMY  1937  . TRANSURETHRAL RESECTION OF PROSTATE  01/23/2011   Procedure: TRANSURETHRAL RESECTION OF THE PROSTATE WITH GYRUS INSTRUMENTS;  Surgeon: Molli Hazard, MD;  Location: [facility];  Service: Urology;  Laterality: N/A;  . VASECTOMY  1972    Family History  Problem Relation Age of Onset  . Emphysema Brother   . Heart disease Mother   . Cancer Daughter     breast    No Known Allergies  Current Outpatient Prescriptions on  File Prior to Visit  Medication Sig Dispense Refill  . albuterol (PROVENTIL HFA;VENTOLIN HFA) 108 (90 BASE) MCG/ACT inhaler Inhale 2 puffs into the lungs every 6 (six) hours as needed for wheezing or shortness of breath. 1 Inhaler 2  . aspirin 81 MG tablet Take 1 tablet (81 mg total) by mouth every morning. Stop for 1 week and then resume (Patient taking differently: Take 81 mg by mouth every morning. )    . canagliflozin (INVOKANA) 100 MG TABS tablet Take 1 tablet (100 mg total) by mouth daily before breakfast. 30 tablet 3  . celecoxib (CELEBREX) 200 MG capsule Take 1 capsule (200 mg total) by mouth every evening. 30  capsule 1  . cholecalciferol (VITAMIN D) 1000 UNITS tablet Take 2,000 Units by mouth 2 (two) times daily.     . clonazePAM (KLONOPIN) 0.25 MG disintegrating tablet 1 tab po q hs prn insomnia or anxiety 10 tablet 2  . enalapril (VASOTEC) 2.5 MG tablet Take 2.5 mg by mouth every morning. Reported on 07/12/2015    . Indacaterol Maleate (ARCAPTA NEOHALER) 75 MCG CAPS Place 1 capsule into inhaler and inhale every morning. 90 capsule 1  . lidocaine (LIDODERM) 5 % Place 1 patch onto the skin daily. Remove & Discard patch within 12 hours or as directed by MD 30 patch 0  . meclizine (ANTIVERT) 25 MG tablet Take 1 tablet (25 mg total) by mouth 3 (three) times daily as needed for dizziness. 20 tablet 0  . metFORMIN (GLUCOPHAGE) 500 MG tablet TAKE ONE TABLET BY MOUTH THREE TIMES DAILY 90 tablet 2  . misoprostol (CYTOTEC) 100 MCG tablet TAKE ONE TABLET BY MOUTH ONCE DAILY IN THE MORNING, ONE TABLET IN THE EVENING, AND TWO TABLETS ONCE DAILY AT BEDTIME 120 tablet 2  . Multiple Vitamins-Minerals (CENTRUM) tablet Take 1 tablet by mouth every morning. Reported on 01/05/2015    . OVER THE COUNTER MEDICATION Place 1-2 drops into both eyes daily as needed (dry red eyes.). Reported on 01/05/2015    . PARoxetine (PAXIL) 20 MG tablet Take 1 tablet (20 mg total) by mouth daily. 90 tablet 1  . Tiotropium Bromide-Olodaterol (STIOLTO RESPIMAT) 2.5-2.5 MCG/ACT AERS Inhale 2 puffs into the lungs daily. 4 g 5  . Tiotropium Bromide-Olodaterol (STIOLTO RESPIMAT) 2.5-2.5 MCG/ACT AERS Inhale 2 puffs into the lungs daily. 4 g 2  . tobramycin (TOBREX) 0.3 % ophthalmic solution Place 2 drops into the left eye every 6 (six) hours. 5 mL 0  . UNABLE TO FIND CPAP    . vitamin C (ASCORBIC ACID) 500 MG tablet Take 500 mg by mouth every morning.     Marland Kitchen atorvastatin (LIPITOR) 40 MG tablet TAKE ONE TABLET BY MOUTH ONCE DAILY IN THE MORNING 90 tablet 0   No current facility-administered medications on file prior to visit.     BP 105/69 (BP  Location: Right Arm, Patient Position: Sitting, Cuff Size: Normal)   Pulse (!) 108   Temp 98 F (36.7 C) (Oral)   Resp 18   Ht 6' (1.829 m)   Wt 251 lb 12.8 oz (114.2 kg)   SpO2 95% Comment: 2L o2  BMI 34.15 kg/m       Objective:   Physical Exam  General- No acute distress. Pleasant patient. Neck- Full range of motion, no jvd Lungs- Clear, even and unlabored. Heart- regular rate and rhythm. Neurologic- CNII- XII grossly intact.  Lt eye-  Less red and injected. No discharge present. on palpation of upper eye lid area  moderate to severe pain. Lid looks ok.(more pain/pressure compared when pressed briefly on left side)      Assessment & Plan:  Your left eye overall looks better on exam. However the new onset pressure to eye(moderate-severe pressure on light palpation of upper lid) since yesterday is a concern.  I want Eye MD to see you today or tomorrow morning at the latest. If scheduled for tomorrow am but your pressure worsens or new signs or symptoms then ED evaluation.  Shin Lamour, [name], PA-C

## 2015-10-11 NOTE — Progress Notes (Signed)
Pre visit review using our clinic review tool, if applicable. No additional management support is needed unless otherwise documented below in the visit note. 

## 2015-10-15 ENCOUNTER — Other Ambulatory Visit: Payer: Self-pay | Admitting: Medical

## 2015-10-23 ENCOUNTER — Encounter: Payer: Self-pay | Admitting: Medical

## 2015-10-23 NOTE — Telephone Encounter (Signed)
I gave [employer] form I filled out. Asked Santiago Glad LPN to fax form and to  fill out part regarding pt dob. Asked her to  Make a copy of the form as well.

## 2015-10-23 NOTE — Progress Notes (Signed)
Completed [employer] form. Patient needs to complete his part. Left message on answering machine that he can pick up at front desk. Form states it has to be completed in its intirety before sending back.

## 2015-10-31 DIAGNOSIS — G4733 Obstructive sleep apnea (adult) (pediatric): Secondary | ICD-10-CM | POA: Diagnosis not present

## 2015-10-31 DIAGNOSIS — J449 Chronic obstructive pulmonary disease, unspecified: Secondary | ICD-10-CM | POA: Diagnosis not present

## 2015-11-13 ENCOUNTER — Other Ambulatory Visit: Payer: Self-pay | Admitting: Medical

## 2015-11-13 DIAGNOSIS — G4733 Obstructive sleep apnea (adult) (pediatric): Secondary | ICD-10-CM | POA: Diagnosis not present

## 2015-12-01 DIAGNOSIS — J449 Chronic obstructive pulmonary disease, unspecified: Secondary | ICD-10-CM | POA: Diagnosis not present

## 2015-12-01 DIAGNOSIS — G4733 Obstructive sleep apnea (adult) (pediatric): Secondary | ICD-10-CM | POA: Diagnosis not present

## 2015-12-10 ENCOUNTER — Other Ambulatory Visit: Payer: Self-pay | Admitting: Medical

## 2015-12-21 DIAGNOSIS — H2513 Age-related nuclear cataract, bilateral: Secondary | ICD-10-CM | POA: Diagnosis not present

## 2015-12-21 DIAGNOSIS — E119 Type 2 diabetes mellitus without complications: Secondary | ICD-10-CM | POA: Diagnosis not present

## 2015-12-21 LAB — HM DIABETES EYE EXAM

## 2015-12-26 NOTE — Progress Notes (Unsigned)
Opened in error

## 2015-12-31 DIAGNOSIS — G4733 Obstructive sleep apnea (adult) (pediatric): Secondary | ICD-10-CM | POA: Diagnosis not present

## 2015-12-31 DIAGNOSIS — J449 Chronic obstructive pulmonary disease, unspecified: Secondary | ICD-10-CM | POA: Diagnosis not present

## 2016-01-07 ENCOUNTER — Other Ambulatory Visit: Payer: Self-pay | Admitting: Medical

## 2016-01-08 ENCOUNTER — Encounter: Payer: Self-pay | Admitting: Emergency Medicine

## 2016-01-08 ENCOUNTER — Ambulatory Visit (INDEPENDENT_AMBULATORY_CARE_PROVIDER_SITE_OTHER): Payer: Medicare Other | Admitting: Emergency Medicine

## 2016-01-08 DIAGNOSIS — J438 Other emphysema: Secondary | ICD-10-CM

## 2016-01-08 DIAGNOSIS — G4733 Obstructive sleep apnea (adult) (pediatric): Secondary | ICD-10-CM | POA: Diagnosis not present

## 2016-01-08 NOTE — Progress Notes (Signed)
Subjective:    Patient ID: Lance Powell, male    DOB: 1929-12-11, 82 y.o.   MRN: [id number]  HPI 82 year old man with a history of former tobacco use (120 pack years), COPD and obstructive sleep apnea. He's been previously followed by Dr [name]. He is on O2 2L/min continuous. He is very reliable about CPAP use, benefits from it. Compliance on download was 99%, 98% > 4 hours. Resultant AHI 2.3. He exacerbates . He was started by T Parrett on stiolto but he can't recall whether he ever took it or whether he benefited.  He does not recall any recent exacerbations. He was treated for PNA before but he cannot recall the details.   ROV 07/12/15 -- follow-up visit for patient with obstructive sleep apnea on CPAP as well as COPD and chronic hypoxemic respiratory failure. At his last visit we did a trial of starting Stiolto - he does not believe that he has benefited, used it for 5 days. He didn't feel that it helped him. He is using Pro-Air prn, once a week. Minimal cough, minimal dyspnea. He is wearing oxygen at 2 L/m at all times.  ROV 01/08/16 -- This follow-up visit for patient with a history of obstructive sleep apnea on CPAP. He also has chronic hypoxemic respiratory failure in the setting of COPD. We have discussed and tried scheduled bronchodilators but he is not using at this time. He does use albuterol approximately . He complains today of nasal congestion, rhinorrhea . He wears his CPAP reliably, never mises a night. He feels that he benefits from it. Has more daytime energy. His sleep pattern is abnormal - he has been sleeping till 11:00-12:00pm, then cannot get to sleep at night when he goes to bed.    CAT Score 04/27/2015  Total CAT Score 16    Review of Systems As per HPI  Past Medical History:  Diagnosis Date  . COPD (chronic obstructive pulmonary disease) ([place])   . Depression 03/08/2014  . Diabetes mellitus   . GERD (gastroesophageal reflux disease) 03/08/2014  . History of blood  transfusion   . Hyperlipemia   . Hypertension   . OSA (obstructive sleep apnea)   . Osteoarthritis   . Rheumatoid arthritis(714.0)   . Sleep apnea    cpap     Family History  Problem Relation Age of Onset  . Emphysema Brother   . Heart disease Mother   . Cancer Daughter     breast     Social History   Social History  . Marital status: Married    Spouse name: N/A  . Number of children: 6  . Years of education: N/A   Occupational History  . Retired     Chief Financial Officer   Social History Main Topics  . Smoking status: Former Smoker    Packs/day: 4.00    Years: 30.00    Types: Cigarettes    Quit date: 01/21/1978  . Smokeless tobacco: Never Used  . Alcohol use No  . Drug use: No  . Sexual activity: Not on file   Other Topics Concern  . Not on file   Social History Narrative  . 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 albuterol (PROVENTIL HFA;VENTOLIN HFA) 108 (90 BASE) MCG/ACT inhaler Inhale 2 puffs into the lungs  every 6 (six) hours as needed for wheezing or shortness of breath. 1 Inhaler 2  . aspirin 81 MG tablet Take 1 tablet (81 mg total) by mouth every morning. Stop for 1 week and then resume (Patient taking differently: Take 81 mg by mouth every morning. )    . atorvastatin (LIPITOR) 40 MG tablet TAKE ONE TABLET BY MOUTH ONCE DAILY IN THE MORNING 90 tablet 0  . canagliflozin (INVOKANA) 100 MG TABS tablet Take 1 tablet (100 mg total) by mouth daily before breakfast. 30 tablet 3  . celecoxib (CELEBREX) 200 MG capsule TAKE ONE CAPSULE BY MOUTH ONCE DAILY IN THE EVENING 30 capsule 1  . cholecalciferol (VITAMIN D) 1000 UNITS tablet Take 2,000 Units by mouth 2 (two) times daily.     . clonazePAM (KLONOPIN) 0.25 MG disintegrating tablet 1 tab po q hs prn insomnia or anxiety 10 tablet 2  . enalapril  (VASOTEC) 2.5 MG tablet Take 2.5 mg by mouth every morning. Reported on 07/12/2015    . Indacaterol Maleate (ARCAPTA NEOHALER) 75 MCG CAPS Place 1 capsule into inhaler and inhale every morning. 90 capsule 1  . lidocaine (LIDODERM) 5 % Place 1 patch onto the skin daily. Remove & Discard patch within 12 hours or as directed by MD 30 patch 0  . meclizine (ANTIVERT) 25 MG tablet Take 1 tablet (25 mg total) by mouth 3 (three) times daily as needed for dizziness. 20 tablet 0  . metFORMIN (GLUCOPHAGE) 500 MG tablet TAKE ONE TABLET BY MOUTH THREE TIMES DAILY 90 tablet 2  . misoprostol (CYTOTEC) 100 MCG tablet TAKE ONE TABLET BY MOUTH ONCE DAILY IN THE MORNING, ONE TABLET IN THE EVENING, AND TWO TABLETS ONCE DAILY AT BEDTIME 120 tablet 2  . Multiple Vitamins-Minerals (CENTRUM) tablet Take 1 tablet by mouth every morning. Reported on 01/05/2015    . OVER THE COUNTER MEDICATION Place 1-2 drops into both eyes daily as needed (dry red eyes.). Reported on 01/05/2015    . PARoxetine (PAXIL) 20 MG tablet Take 1 tablet (20 mg total) by mouth daily. 90 tablet 1  . Tiotropium Bromide-Olodaterol (STIOLTO RESPIMAT) 2.5-2.5 MCG/ACT AERS Inhale 2 puffs into the lungs daily. 4 g 5  . Tiotropium Bromide-Olodaterol (STIOLTO RESPIMAT) 2.5-2.5 MCG/ACT AERS Inhale 2 puffs into the lungs daily. 4 g 2  . tobramycin (TOBREX) 0.3 % ophthalmic solution Place 2 drops into the left eye every 6 (six) hours. 5 mL 0  . UNABLE TO FIND CPAP    . vitamin C (ASCORBIC ACID) 500 MG tablet Take 500 mg by mouth every morning.      No facility-administered medications prior to visit.          Objective:   Physical Exam Vitals:   01/08/16 1137 01/08/16 1138  BP:  126/70  Pulse:  (!) 102  SpO2:  96%  Weight: 259 lb (117.5 kg)   Height: 6' (1.829 m)    Gen: Pleasant, overwt man, in no distress,  normal affect  ENT: No lesions,  mouth clear,  oropharynx clear, no postnasal drip  Neck: No JVD, no TMG, no carotid bruits  Lungs: No  use of accessory muscles,  clear without rales or rhonchi  Cardiovascular: RRR, heart sounds normal, no murmur or gallops, no peripheral edema  Musculoskeletal: No deformities, no cyanosis or clubbing  Neuro: alert, non focal  Skin: Warm, no lesions or rashes      Assessment & Plan:  COPD (chronic obstructive pulmonary disease) Does not believe that he  needs to be on a scheduled bronchodilator at this time. Continue oxygen As ordered. Continue albuterol when necessary.  Obstructive sleep apnea Continue CPAP daily at bedtime. He does not have a download available today but he is compliant, he is benefiting.  [name], MD, PhD 01/08/2016, 11:55 AM [facility] [phone] or if no answer [phone]

## 2016-01-08 NOTE — Assessment & Plan Note (Signed)
Continue CPAP daily at bedtime. He does not have a download available today but he is compliant, he is benefiting.

## 2016-01-08 NOTE — Patient Instructions (Signed)
Please continue your oxygen at 2L/min.  Take albuterol 2 puffs up to every 4 hours if needed for shortness of breath.  Please continue your CPAP every night Follow with Dr Lamonte Sakai in 12 months or sooner if you have any problems

## 2016-01-08 NOTE — Assessment & Plan Note (Signed)
Does not believe that he needs to be on a scheduled bronchodilator at this time. Continue oxygen As ordered. Continue albuterol when necessary.

## 2016-01-31 DIAGNOSIS — J449 Chronic obstructive pulmonary disease, unspecified: Secondary | ICD-10-CM | POA: Diagnosis not present

## 2016-01-31 DIAGNOSIS — G4733 Obstructive sleep apnea (adult) (pediatric): Secondary | ICD-10-CM | POA: Diagnosis not present

## 2016-02-13 ENCOUNTER — Other Ambulatory Visit: Payer: Self-pay | Admitting: Medical

## 2016-02-13 ENCOUNTER — Other Ambulatory Visit: Payer: Self-pay | Admitting: *Deleted

## 2016-02-13 MED ORDER — CELECOXIB 200 MG PO CAPS: ORAL_CAPSULE | ORAL | 1 refills | Status: DC

## 2016-02-13 NOTE — Progress Notes (Signed)
Refill sent per [facility] refill protocol/SLS  

## 2016-03-02 DIAGNOSIS — J449 Chronic obstructive pulmonary disease, unspecified: Secondary | ICD-10-CM | POA: Diagnosis not present

## 2016-03-02 DIAGNOSIS — G4733 Obstructive sleep apnea (adult) (pediatric): Secondary | ICD-10-CM | POA: Diagnosis not present

## 2016-03-30 DIAGNOSIS — J449 Chronic obstructive pulmonary disease, unspecified: Secondary | ICD-10-CM | POA: Diagnosis not present

## 2016-03-30 DIAGNOSIS — G4733 Obstructive sleep apnea (adult) (pediatric): Secondary | ICD-10-CM | POA: Diagnosis not present

## 2016-04-12 ENCOUNTER — Other Ambulatory Visit: Payer: Self-pay | Admitting: *Deleted

## 2016-04-12 MED ORDER — METFORMIN HCL 500 MG PO TABS: 500.0000 mg | ORAL_TABLET | Freq: Three times a day (TID) | ORAL | 0 refills | Status: DC

## 2016-04-12 NOTE — Progress Notes (Unsigned)
Received request from pharmacy for 90-day supply; denied, pt must first have OV with fasting labs. Requested drug refills are authorized 30-day Only, however, the patient needs further evaluation and/or laboratory testing before further refills are given. Ask him to make an appointment for this/SLS 03/23  Please call patient and schedule F/U office visit with Fasting labs per provider/SLS

## 2016-04-12 NOTE — Progress Notes (Signed)
Patient scheduled for 04/17/2016 with [name].

## 2016-04-17 ENCOUNTER — Ambulatory Visit (INDEPENDENT_AMBULATORY_CARE_PROVIDER_SITE_OTHER): Payer: Medicare Other | Admitting: Medical

## 2016-04-17 ENCOUNTER — Encounter: Payer: Self-pay | Admitting: Medical

## 2016-04-17 VITALS — BP 108/62 | HR 113 | Temp 97.6°F | Ht 72.0 in | Wt 243.2 lb

## 2016-04-17 DIAGNOSIS — M545 Low back pain, unspecified: Secondary | ICD-10-CM

## 2016-04-17 DIAGNOSIS — Z7409 Other reduced mobility: Secondary | ICD-10-CM

## 2016-04-17 DIAGNOSIS — H9190 Unspecified hearing loss, unspecified ear: Secondary | ICD-10-CM

## 2016-04-17 DIAGNOSIS — L989 Disorder of the skin and subcutaneous tissue, unspecified: Secondary | ICD-10-CM | POA: Diagnosis not present

## 2016-04-17 DIAGNOSIS — E785 Hyperlipidemia, unspecified: Secondary | ICD-10-CM

## 2016-04-17 DIAGNOSIS — F419 Anxiety disorder, unspecified: Secondary | ICD-10-CM

## 2016-04-17 DIAGNOSIS — K5900 Constipation, unspecified: Secondary | ICD-10-CM

## 2016-04-17 DIAGNOSIS — J449 Chronic obstructive pulmonary disease, unspecified: Secondary | ICD-10-CM

## 2016-04-17 DIAGNOSIS — E119 Type 2 diabetes mellitus without complications: Secondary | ICD-10-CM | POA: Diagnosis not present

## 2016-04-17 NOTE — Patient Instructions (Addendum)
For your chronic problems please get fasting lab tomorrow am. Schedule test as you leave.  Adjust meds after labs back.  Will refer to PT, Bariatric MD, Dermatologist and audiologist.  May switch to effexor after review of labs. If I rx effexor will stop paxil.  Remember if no bm for 2 days can use miralax, dulcolax or mag citrate. If by 3 days no bm then notify us as in that event would need imaging.  Copd. Continue Oxygen.  Follow up in 3 months or as needed

## 2016-04-17 NOTE — Progress Notes (Signed)
Subjective:    Patient ID: Lance Powell, male    DOB: 08-23-1929, 83 y.o.   MRN: [id number]  HPI  Pt in today. He is in for follow up.  Pt in and need refill refill of meds in near future.Not out of meds yet.  Pt needs fasting labs. His lipids were good in sept. Pt is on atorvastatin.  Pt last a1c was 7.5 About average of 166 sugar over 3 months. Pt is on metformin.  Pt has some occasional low back pain. Mild-moderate. Pt states went to chiropracter months ago and helped. He has celebrex available if needed.  Pt is on oxygen. No severe sob or wheezing episodes.  Pt mood is ok recently. Pt had struggled some with relative who passed away from brain cancer in January but doing better now. Daughter thinks maybe could do better with med that might increase his energy.   Pt has not been checking his blood sugar.   Pt last bm today was small lumpy and hard. Last 2 days no bm. Abdomen does not feel bloated.  Pt states normal pattern of bm is 2-3 times a day.   Some skin lesions on his neck. Recently new.     Review of Systems  Constitutional: Positive for fatigue.  HENT: Negative for congestion, dental problem, ear discharge, ear pain, facial swelling, hearing loss and postnasal drip.   Respiratory: Negative for cough, chest tightness and wheezing.   Cardiovascular: Negative for chest pain and palpitations.  Gastrointestinal: Positive for constipation. Negative for abdominal pain.       Yesterday but had bm today.  Endocrine: Negative for polyphagia and polyuria.       Mild thirst for one week. But not today.  Musculoskeletal: Negative for back pain, joint swelling, myalgias and neck stiffness.  Skin: Negative for rash.       Skin lesions.  Neurological: Negative for dizziness, facial asymmetry, light-headedness and headaches.  Hematological: Negative for adenopathy. Does not bruise/bleed easily.  Psychiatric/Behavioral: Negative for behavioral problems, confusion and sleep  disturbance. The patient is not nervous/anxious.      Past Medical History:  Diagnosis Date  . COPD (chronic obstructive pulmonary disease) ([place])   . Depression 03/08/2014  . Diabetes mellitus   . GERD (gastroesophageal reflux disease) 03/08/2014  . History of blood transfusion   . Hyperlipemia   . Hypertension   . OSA (obstructive sleep apnea)   . Osteoarthritis   . Rheumatoid arthritis(714.0)   . Sleep apnea    cpap     Social History   Social History  . Marital status: Married    Spouse name: N/A  . Number of children: 6  . Years of education: N/A   Occupational History  . Retired     Chief Financial Officer   Social History Main Topics  . Smoking status: Former Smoker    Packs/day: 4.00    Years: 30.00    Types: Cigarettes    Quit date: 01/21/1978  . Smokeless tobacco: Never Used  . Alcohol use No  . Drug use: No  . Sexual activity: Not on file   Other Topics Concern  . Not on file   Social History Narrative  . No narrative on file    Past Surgical History:  Procedure Laterality Date  . APPENDECTOMY  1969  . CHOLECYSTECTOMY N/A 06/16/2014   Procedure: LAPAROSCOPIC CHOLECYSTECTOMY;  Surgeon: Greer Pickerel, MD;  Location: [facility];  Service: General;  Laterality: N/A;  . CYSTOSCOPY  01/23/2011  Procedure: CYSTOSCOPY;  Surgeon: Molli Hazard, MD;  Location: [facility];  Service: Urology;  Laterality: N/A;  . FLEXIBLE SIGMOIDOSCOPY  01/07/2012   Procedure: FLEXIBLE SIGMOIDOSCOPY;  Surgeon: Ladene Artist, MD,FACG;  Location: [facility];  Service: Endoscopy;  Laterality: N/A;  . HERNIA REPAIR  1990   right and left inguinal  . NISSEN FUNDOPLICATION    . 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 TONSILLECTOMY  1937  . TRANSURETHRAL RESECTION OF PROSTATE  01/23/2011   Procedure: TRANSURETHRAL RESECTION OF THE PROSTATE WITH GYRUS INSTRUMENTS;  Surgeon: Molli Hazard, MD;  Location: [facility];  Service: Urology;  Laterality:  N/A;  . VASECTOMY  1972    Family History  Problem Relation Age of Onset  . Emphysema Brother   . Heart disease Mother   . Cancer Daughter     breast    No Known Allergies  Current Outpatient Prescriptions on File Prior to Visit  Medication Sig Dispense Refill  . albuterol (PROVENTIL HFA;VENTOLIN HFA) 108 (90 BASE) MCG/ACT inhaler Inhale 2 puffs into the lungs every 6 (six) hours as needed for wheezing or shortness of breath. 1 Inhaler 2  . aspirin 81 MG tablet Take 1 tablet (81 mg total) by mouth every morning. Stop for 1 week and then resume (Patient taking differently: Take 81 mg by mouth every morning. )    . atorvastatin (LIPITOR) 40 MG tablet TAKE ONE TABLET BY MOUTH ONCE DAILY IN THE MORNING 90 tablet 0  . canagliflozin (INVOKANA) 100 MG TABS tablet Take 1 tablet (100 mg total) by mouth daily before breakfast. 30 tablet 3  . celecoxib (CELEBREX) 200 MG capsule TAKE ONE CAPSULE BY MOUTH ONCE DAILY IN THE EVENING 30 capsule 1  . cholecalciferol (VITAMIN D) 1000 UNITS tablet Take 2,000 Units by mouth 2 (two) times daily.     . clonazePAM (KLONOPIN) 0.25 MG disintegrating tablet 1 tab po q hs prn insomnia or anxiety 10 tablet 2  . enalapril (VASOTEC) 2.5 MG tablet Take 2.5 mg by mouth every morning. Reported on 07/12/2015    . Indacaterol Maleate (ARCAPTA NEOHALER) 75 MCG CAPS Place 1 capsule into inhaler and inhale every morning. 90 capsule 1  . lidocaine (LIDODERM) 5 % Place 1 patch onto the skin daily. Remove & Discard patch within 12 hours or as directed by MD 30 patch 0  . meclizine (ANTIVERT) 25 MG tablet Take 1 tablet (25 mg total) by mouth 3 (three) times daily as needed for dizziness. 20 tablet 0  . metFORMIN (GLUCOPHAGE) 500 MG tablet Take 1 tablet (500 mg total) by mouth 3 (three) times daily. 90 tablet 0  . misoprostol (CYTOTEC) 100 MCG tablet TAKE ONE TABLET BY MOUTH ONCE DAILY IN THE MORNING AND ONE TABLET IN THE EVENING AND TAKE TWO TABLETS AT BEDTIME 120 tablet 2  .  Multiple Vitamins-Minerals (CENTRUM) tablet Take 1 tablet by mouth every morning. Reported on 01/05/2015    . OVER THE COUNTER MEDICATION Place 1-2 drops into both eyes daily as needed (dry red eyes.). Reported on 01/05/2015    . PARoxetine (PAXIL) 20 MG tablet Take 1 tablet (20 mg total) by mouth daily. 90 tablet 1  . Tiotropium Bromide-Olodaterol (STIOLTO RESPIMAT) 2.5-2.5 MCG/ACT AERS Inhale 2 puffs into the lungs daily. 4 g 5  . Tiotropium Bromide-Olodaterol (STIOLTO RESPIMAT) 2.5-2.5 MCG/ACT AERS Inhale 2 puffs into the lungs daily. 4 g 2  . tobramycin (TOBREX) 0.3 % ophthalmic solution Place 2  drops into the left eye every 6 (six) hours. 5 mL 0  . UNABLE TO FIND CPAP    . vitamin C (ASCORBIC ACID) 500 MG tablet Take 500 mg by mouth every morning.      No current facility-administered medications on file prior to visit.     BP 108/62 (BP Location: Left Arm, Patient Position: Sitting, Cuff Size: Normal)   Pulse (!) 113   Temp 97.6 F (36.4 C) (Oral)   Ht 6' (1.829 m)   Wt 243 lb 4 oz (110.3 kg)   SpO2 94%   BMI 32.99 kg/m       Objective:   Physical Exam  General Mental Status- Alert. General Appearance- Not in acute distress.   Skin Some large seborrheic keratosis on his neck. Also moderate large skin tag.  Mouth- poor dentition.  Neck Carotid Arteries- Normal color. Moisture- Normal Moisture. No carotid bruits. No JVD.  Chest and Lung Exam Auscultation: Breath Sounds:-Normal.  Cardiovascular Auscultation:Rythm- Regular. Murmurs & Other Heart Sounds:Auscultation of the heart reveals- No Murmurs.  Abdomen Inspection:-Inspeection Normal. Palpation/Percussion:Note:No mass. Palpation and Percussion of the abdomen reveal- Non Tender, Non Distended + BS, no rebound or guarding.    Neurologic Cranial Nerve exam:- CN III-XII intact(No nystagmus), symmetric smile. Strength:- 5/5 equal and symmetric strength both upper and lower extremities.      Assessment &  Plan:  For your chronic problems please get fasting lab tomorrow am. Schedule test as you leave.  Adjust meds after labs back.  Will refer to PT, Bariatric MD, Dermatologist.  May switch to effexor after review of labs. If I rx effexor will stop paxil.  Remember if no bm for 2 days can use miralax, dulcolax or mag citrate. If by 3 days no bm then notify us as in that event would need imaging.  Copd. Continue oxygen.   Follow up in 3 months or as needed  Note 40 minutes spent with pt and his daughter answered various questions and counseled on various conditions. 50% of time spent on counseling. These included his mobility problems, how to lose weight, discussion on his back pain, plan regarding diabetic meds and need to check blood sugar daily. Discussion included education on consitpation and risk of bowel obstruction. In addition counseled pt on potential benefit of effexor over paxil.  Also educate pt on need to attend dentist appointment.

## 2016-04-17 NOTE — Progress Notes (Signed)
Pre visit review using our clinic review tool, if applicable. No additional management support is needed unless otherwise documented below in the visit note. 

## 2016-04-18 ENCOUNTER — Other Ambulatory Visit (INDEPENDENT_AMBULATORY_CARE_PROVIDER_SITE_OTHER): Payer: Medicare Other

## 2016-04-18 DIAGNOSIS — E119 Type 2 diabetes mellitus without complications: Secondary | ICD-10-CM | POA: Diagnosis not present

## 2016-04-18 DIAGNOSIS — E785 Hyperlipidemia, unspecified: Secondary | ICD-10-CM | POA: Diagnosis not present

## 2016-04-18 LAB — COMPREHENSIVE METABOLIC PANEL
ALK PHOS: 106 U/L (ref 39–117)
ALT: 32 U/L (ref 0–53)
AST: 26 U/L (ref 0–37)
Albumin: 4.3 g/dL (ref 3.5–5.2)
BILIRUBIN TOTAL: 0.8 mg/dL (ref 0.2–1.2)
BUN: 27 mg/dL — ABNORMAL HIGH (ref 6–23)
CALCIUM: 10.7 mg/dL — AB (ref 8.4–10.5)
CO2: 29 mEq/L (ref 19–32)
Chloride: 97 mEq/L (ref 96–112)
Creatinine, Ser: 1.48 mg/dL (ref 0.40–1.50)
GFR: 48.07 mL/min — AB (ref >60.00)
Glucose, Bld: 416 mg/dL — ABNORMAL HIGH (ref 70–99)
Potassium: 4 mEq/L (ref 3.5–5.1)
Sodium: 135 mEq/L (ref 135–145)
TOTAL PROTEIN: 7.4 g/dL (ref 6.0–8.3)

## 2016-04-18 LAB — CBC WITH DIFFERENTIAL/PLATELET
BASOS ABS: 0.1 10*3/uL (ref 0.0–0.1)
Basophils Relative: 0.6 % (ref 0.0–3.0)
EOS PCT: 2.1 % (ref 0.0–5.0)
Eosinophils Absolute: 0.3 10*3/uL (ref 0.0–0.7)
HEMATOCRIT: 50.5 % (ref 39.0–52.0)
Hemoglobin: 16.6 g/dL (ref 13.0–17.0)
LYMPHS PCT: 35.7 % (ref 12.0–46.0)
Lymphs Abs: 4.6 10*3/uL — ABNORMAL HIGH (ref 0.7–4.0)
MCHC: 32.9 g/dL (ref 30.0–36.0)
MCV: 95.6 fl (ref 78.0–100.0)
MONOS PCT: 9.9 % (ref 3.0–12.0)
Monocytes Absolute: 1.3 10*3/uL — ABNORMAL HIGH (ref 0.1–1.0)
NEUTROS ABS: 6.6 10*3/uL (ref 1.4–7.7)
Neutrophils Relative %: 51.7 % (ref 43.0–77.0)
Platelets: 228 10*3/uL (ref 150.0–400.0)
RBC: 5.28 Mil/uL (ref 4.22–5.81)
RDW: 13.7 % (ref 11.5–15.5)
WBC: 12.8 10*3/uL — ABNORMAL HIGH (ref 4.0–10.5)

## 2016-04-18 LAB — LIPID PANEL
Cholesterol: 142 mg/dL (ref 0–200)
HDL: 39.6 mg/dL
LDL Cholesterol: 72 mg/dL (ref 0–99)
NonHDL: 102.13
Total CHOL/HDL Ratio: 4
Triglycerides: 151 mg/dL — ABNORMAL HIGH (ref 0.0–149.0)
VLDL: 30.2 mg/dL (ref 0.0–40.0)

## 2016-04-18 LAB — HEMOGLOBIN A1C: Hgb A1c MFr Bld: 10.7 % — ABNORMAL HIGH (ref 4.6–6.5)

## 2016-04-19 ENCOUNTER — Emergency Department (HOSPITAL_BASED_OUTPATIENT_CLINIC_OR_DEPARTMENT_OTHER)
Admission: EM | Admit: 2016-04-19 | Discharge: 2016-04-19 | Disposition: A | Discharge status: Home / Self Care | Payer: Medicare Other | Attending: Emergency Medicine | Admitting: Emergency Medicine

## 2016-04-19 ENCOUNTER — Emergency Department (HOSPITAL_BASED_OUTPATIENT_CLINIC_OR_DEPARTMENT_OTHER): Payer: Medicare Other

## 2016-04-19 ENCOUNTER — Encounter (HOSPITAL_BASED_OUTPATIENT_CLINIC_OR_DEPARTMENT_OTHER): Payer: Self-pay | Admitting: Respiratory Therapy

## 2016-04-19 DIAGNOSIS — E860 Dehydration: Secondary | ICD-10-CM | POA: Diagnosis not present

## 2016-04-19 DIAGNOSIS — Z7982 Long term (current) use of aspirin: Secondary | ICD-10-CM | POA: Diagnosis not present

## 2016-04-19 DIAGNOSIS — I10 Essential (primary) hypertension: Secondary | ICD-10-CM | POA: Diagnosis not present

## 2016-04-19 DIAGNOSIS — Z87891 Personal history of nicotine dependence: Secondary | ICD-10-CM | POA: Insufficient documentation

## 2016-04-19 DIAGNOSIS — E1165 Type 2 diabetes mellitus with hyperglycemia: Secondary | ICD-10-CM | POA: Diagnosis not present

## 2016-04-19 DIAGNOSIS — Z7984 Long term (current) use of oral hypoglycemic drugs: Secondary | ICD-10-CM | POA: Diagnosis not present

## 2016-04-19 DIAGNOSIS — J449 Chronic obstructive pulmonary disease, unspecified: Secondary | ICD-10-CM | POA: Diagnosis not present

## 2016-04-19 DIAGNOSIS — R739 Hyperglycemia, unspecified: Secondary | ICD-10-CM

## 2016-04-19 DIAGNOSIS — R0602 Shortness of breath: Secondary | ICD-10-CM | POA: Diagnosis not present

## 2016-04-19 DIAGNOSIS — Z794 Long term (current) use of insulin: Secondary | ICD-10-CM | POA: Diagnosis not present

## 2016-04-19 LAB — CBG MONITORING, ED
GLUCOSE-CAPILLARY: 498 mg/dL — AB (ref 65–99)
Glucose-Capillary: 421 mg/dL — ABNORMAL HIGH (ref 65–99)
Glucose-Capillary: 408 mg/dL — ABNORMAL HIGH (ref 65–99)
Glucose-Capillary: 334 mg/dL — ABNORMAL HIGH (ref 65–99)

## 2016-04-19 LAB — BASIC METABOLIC PANEL
ANION GAP: 9 (ref 5–15)
BUN: 34 mg/dL — AB (ref 6–20)
CALCIUM: 9.8 mg/dL (ref 8.9–10.3)
CO2: 23 mmol/L (ref 22–32)
Chloride: 98 mmol/L — ABNORMAL LOW (ref 101–111)
Creatinine, Ser: 1.46 mg/dL — ABNORMAL HIGH (ref 0.61–1.24)
GFR calc Af Amer: 49 mL/min — ABNORMAL LOW (ref >60)
GFR, EST NON AFRICAN AMERICAN: 42 mL/min — AB (ref >60)
Glucose, Bld: 547 mg/dL (ref 65–99)
POTASSIUM: 4.4 mmol/L (ref 3.5–5.1)
SODIUM: 130 mmol/L — AB (ref 135–145)

## 2016-04-19 LAB — URINALYSIS, MICROSCOPIC (REFLEX)
Bacteria, UA: NONE SEEN
SQUAMOUS EPITHELIAL / LPF: NONE SEEN

## 2016-04-19 LAB — CBC WITH DIFFERENTIAL/PLATELET
BASOS PCT: 0 %
Basophils Absolute: 0 10*3/uL (ref 0.0–0.1)
EOS ABS: 0.2 10*3/uL (ref 0.0–0.7)
Eosinophils Relative: 2 %
HCT: 46.5 % (ref 39.0–52.0)
HEMOGLOBIN: 15.9 g/dL (ref 13.0–17.0)
Lymphocytes Relative: 31 %
Lymphs Abs: 3.2 10*3/uL (ref 0.7–4.0)
MCH: 32 pg (ref 26.0–34.0)
MCHC: 34.2 g/dL (ref 30.0–36.0)
MCV: 93.6 fL (ref 78.0–100.0)
Monocytes Absolute: 1.1 10*3/uL — ABNORMAL HIGH (ref 0.1–1.0)
Monocytes Relative: 11 %
NEUTROS PCT: 56 %
Neutro Abs: 5.7 10*3/uL (ref 1.7–7.7)
Platelets: 197 10*3/uL (ref 150–400)
RBC: 4.97 MIL/uL (ref 4.22–5.81)
RDW: 13.6 % (ref 11.5–15.5)
WBC: 10.1 10*3/uL (ref 4.0–10.5)

## 2016-04-19 LAB — URINALYSIS, ROUTINE W REFLEX MICROSCOPIC
Bilirubin Urine: NEGATIVE
Glucose, UA: >=500 mg/dL — AB
HGB URINE DIPSTICK: NEGATIVE
Ketones, ur: NEGATIVE mg/dL
LEUKOCYTES UA: NEGATIVE
Nitrite: NEGATIVE
PROTEIN: NEGATIVE mg/dL
Specific Gravity, Urine: 1.031 — ABNORMAL HIGH (ref 1.005–1.030)
pH: 5 (ref 5.0–8.0)

## 2016-04-19 LAB — TROPONIN I

## 2016-04-19 MED ORDER — INSULIN REGULAR HUMAN 100 UNIT/ML IJ SOLN
15.0000 [IU] | Freq: Once | INTRAMUSCULAR | Status: AC
  Administered 2016-04-19: 15 [IU] via SUBCUTANEOUS
  Filled 2016-04-19: qty 1

## 2016-04-19 MED ORDER — INSULIN ASPART 100 UNIT/ML ~~LOC~~ SOLN
10.0000 [IU] | Freq: Once | SUBCUTANEOUS | Status: AC
  Administered 2016-04-19: 10 [IU] via INTRAVENOUS
  Filled 2016-04-19: qty 1

## 2016-04-19 MED ORDER — SODIUM CHLORIDE 0.9 % IV BOLUS (SEPSIS)
500.0000 mL | Freq: Once | INTRAVENOUS | Status: AC
  Administered 2016-04-19: 1000 mL via INTRAVENOUS

## 2016-04-19 MED ORDER — SODIUM CHLORIDE 0.9 % IV BOLUS (SEPSIS)
1000.0000 mL | Freq: Once | INTRAVENOUS | Status: AC
  Administered 2016-04-19: 1000 mL via INTRAVENOUS

## 2016-04-19 NOTE — ED Triage Notes (Signed)
PT presents to ED from home with complaints of high blood sugar, not feeling well, not eating as much as normal, family  States speech is slurred. Pt had blood draw yesterday and was told to come here that his blood sugar was high.Marland Kitchen

## 2016-04-19 NOTE — ED Provider Notes (Signed)
[facility]PT [facility] Provider Note   CSN: [id number] Arrival date & time: 04/19/16  0007     History   Chief Complaint Chief Complaint  Patient presents with  . Hyperglycemia    HPI Lance Powell is a 83 y.o. male.  Patient brought to the emergency department from home by family for evaluation of elevated blood sugar. Patient lives alone. Family report that he has not been following any of his diet for his diabetes, possibly has not been taking his medications. He admits that he eats and drinks whatever he wants, has not checked his blood sugar in months. Over the last week he has been feeling weak and family has been noticing that he does not seem to be acting like himself. They report some slurred speech, patient denies this.      Past Medical History:  Diagnosis Date  . COPD (chronic obstructive pulmonary disease) ([place])   . Depression 03/08/2014  . Diabetes mellitus   . GERD (gastroesophageal reflux disease) 03/08/2014  . History of blood transfusion   . Hyperlipemia   . Hypertension   . OSA (obstructive sleep apnea)   . Osteoarthritis   . Rheumatoid arthritis(714.0)   . Sleep apnea    cpap    Patient Active Problem List   Diagnosis Date Noted  . Hip pain 07/27/2014  . Abrasion 07/27/2014  . Insomnia 06/27/2014  . Chronic cholecystitis 06/16/2014  . Coccyx pain 03/30/2014  . Diabetes mellitus type II, controlled ([place]) 03/08/2014  . Depression 03/08/2014  . GERD (gastroesophageal reflux disease) 03/08/2014  . Hyperlipidemia 03/08/2014  . Abdominal pain   . Diverticulitis of large intestine without perforation or abscess without bleeding   . Blood poisoning ([place])   . Calculus of gallbladder with acute cholecystitis 02/27/2014  . Acute cholecystitis   . Diverticulitis 02/25/2014  . Urinary retention 02/25/2014  . Hypertension 02/25/2014  . SBO (small bowel obstruction) 02/25/2014  . Sepsis ([place]) 02/25/2014  . Cholecystitis 02/25/2014  . Personal  history of colonic polyps 01/06/2012  . Special screening for malignant neoplasms, colon 11/15/2011  . Pruritus ani 11/15/2011  . COPD (chronic obstructive pulmonary disease) ([place]) 01/11/2011  . DOE (dyspnea on exertion) 01/01/2011  . Fissure in ano 12/21/2010  . HYPERLIPIDEMIA 01/11/2009  . Obstructive sleep apnea 01/11/2009  . ARTHRITIS, RHEUMATOID 01/11/2009  . OSTEOARTHRITIS 01/11/2009    Past Surgical History:  Procedure Laterality Date  . APPENDECTOMY  1969  . CHOLECYSTECTOMY N/A 06/16/2014   Procedure: LAPAROSCOPIC CHOLECYSTECTOMY;  Surgeon: Greer Pickerel, MD;  Location: [facility];  Service: General;  Laterality: N/A;  . CYSTOSCOPY  01/23/2011   Procedure: CYSTOSCOPY;  Surgeon: Molli Hazard, MD;  Location: [facility];  Service: Urology;  Laterality: N/A;  . FLEXIBLE SIGMOIDOSCOPY  01/07/2012   Procedure: FLEXIBLE SIGMOIDOSCOPY;  Surgeon: Ladene Artist, MD,FACG;  Location[facility];  Service: Endoscopy;  Laterality: N/A;  . HERNIA REPAIR  1990   right and left inguinal  . NISSEN FUNDOPLICATION    . 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 TONSILLECTOMY  1937  . TRANSURETHRAL RESECTION OF PROSTATE  01/23/2011   Procedure: TRANSURETHRAL RESECTION OF THE PROSTATE WITH GYRUS INSTRUMENTS;  Surgeon: Molli Hazard, MD;  Location: [facility];  Service: Urology;  Laterality: N/A;  . [place] Medications    Prior to Admission medications   Medication Sig Start Date End Date Taking? Authorizing Provider  albuterol (PROVENTIL  HFA;VENTOLIN HFA) 108 (90 BASE) MCG/ACT inhaler Inhale 2 puffs into the lungs every 6 (six) hours as needed for wheezing or shortness of breath. 03/23/14   [name], PA-C  aspirin 81 MG tablet Take 1 tablet (81 mg total) by mouth every morning. Stop for 1 week and then resume Patient taking differently: Take 81 mg by mouth every morning.  01/08/12   Barton Dubois, MD  atorvastatin  (LIPITOR) 40 MG tablet TAKE ONE TABLET BY MOUTH ONCE DAILY IN THE MORNING 11/14/15   [name], PA-C  canagliflozin [facility]) 100 MG TABS tablet Take 1 tablet (100 mg total) by mouth daily before breakfast. 06/28/15   [name], PA-C  celecoxib (CELEBREX) 200 MG capsule TAKE ONE CAPSULE BY MOUTH ONCE DAILY IN THE EVENING 02/13/16   [name], PA-C  cholecalciferol (VITAMIN D) 1000 UNITS tablet Take 2,000 Units by mouth 2 (two) times daily.     Historical Provider, MD  clonazePAM (KLONOPIN) 0.25 MG disintegrating tablet 1 tab po q hs prn insomnia or anxiety 09/27/15   [name], PA-C  enalapril (VASOTEC) 2.5 MG tablet Take 2.5 mg by mouth every morning. Reported on 07/12/2015    Historical Provider, MD  Indacaterol Maleate (ARCAPTA NEOHALER) 75 MCG CAPS Place 1 capsule into inhaler and inhale every morning. 01/01/12   Kathee Delton, MD  lidocaine (LIDODERM) 5 % Place 1 patch onto the skin daily. Remove & Discard patch within 12 hours or as directed by MD 06/28/15   [name], PA-C  meclizine (ANTIVERT) 25 MG tablet Take 1 tablet (25 mg total) by mouth 3 (three) times daily as needed for dizziness. 12/23/14   Deno Etienne, DO  metFORMIN (GLUCOPHAGE) 500 MG tablet Take 1 tablet (500 mg total) by mouth 3 (three) times daily. 04/12/16   [name] [name], PA-C  misoprostol (CYTOTEC) 100 MCG tablet TAKE ONE TABLET BY MOUTH ONCE DAILY IN THE MORNING AND ONE TABLET IN THE EVENING AND TAKE TWO TABLETS AT BEDTIME 01/08/16   Edward [name], PA-C  Multiple Vitamins-Minerals (CENTRUM) tablet Take 1 tablet by mouth every morning. Reported on 01/05/2015    Historical Provider, MD  OVER THE COUNTER MEDICATION Place 1-2 drops into both eyes daily as needed (dry red eyes.). Reported on 01/05/2015    Historical Provider, MD  PARoxetine (PAXIL) 20 MG tablet Take 1 tablet (20 mg total) by mouth daily. 09/27/15   [name] [name], PA-C  Tiotropium Bromide-Olodaterol (STIOLTO RESPIMAT) 2.5-2.5 MCG/ACT AERS Inhale 2  puffs into the lungs daily. 01/10/15   Tammy S Parrett, NP  Tiotropium Bromide-Olodaterol (STIOLTO RESPIMAT) 2.5-2.5 MCG/ACT AERS Inhale 2 puffs into the lungs daily. 04/27/15   Collene Gobble, MD  tobramycin (TOBREX) 0.3 % ophthalmic solution Place 2 drops into the left eye every 6 (six) hours. 10/09/15   Edward [name], PA-C  UNABLE TO FIND CPAP    Historical Provider, MD  vitamin C (ASCORBIC ACID) 500 MG tablet Take 500 mg by mouth every morning.     Historical Provider, MD    Family History Family History  Problem Relation Age of Onset  . Heart disease Mother   . Emphysema Brother   . Cancer Daughter     breast    Social History Social History  Substance Use Topics  . Smoking status: Former Smoker    Packs/day: 4.00    Years: 30.00    Types: Cigarettes    Quit date: 01/21/1978  . Smokeless tobacco: Never Used  . Alcohol use No  Allergies   Patient has no known allergies.   Review of Systems Review of Systems  Constitutional: Positive for fatigue.  Respiratory: Negative for shortness of breath.   Cardiovascular: Negative for chest pain.  All other systems reviewed and are negative.    Physical Exam Updated Vital Signs BP (!) 150/82   Pulse 95   Temp 98.3 F (36.8 C) (Oral)   Resp (!) 21   Ht 6' (1.829 m)   Wt 244 lb (110.7 kg)   SpO2 94%   BMI 33.09 kg/m   Physical Exam  Constitutional: He is oriented to person, place, and time. He appears well-developed and well-nourished. No distress.  HENT:  Head: Normocephalic and atraumatic.  Right Ear: Hearing normal.  Left Ear: Hearing normal.  Nose: Nose normal.  Mouth/Throat: Oropharynx is clear and moist and mucous membranes are normal.  Eyes: Conjunctivae and EOM are normal. Pupils are equal, round, and reactive to light.  Neck: Normal range of motion. Neck supple.  Cardiovascular: Regular rhythm, S1 normal and S2 normal.  Exam reveals no gallop and no friction rub.   No murmur heard. Pulmonary/Chest:  Effort normal and breath sounds normal. No respiratory distress. He exhibits no tenderness.  Abdominal: Soft. Normal appearance and bowel sounds are normal. There is no hepatosplenomegaly. There is no tenderness. There is no rebound, no guarding, no tenderness at McBurney's point and negative Murphy's sign. No hernia.  Musculoskeletal: Normal range of motion.  Neurological: He is alert and oriented to person, place, and time. He has normal strength. No cranial nerve deficit or sensory deficit. Coordination normal. GCS eye subscore is 4. GCS verbal subscore is 5. GCS motor subscore is 6.  Skin: Skin is warm, dry and intact. No rash noted. No cyanosis.  Psychiatric: He has a normal mood and affect. His speech is normal and behavior is normal. Thought content normal.  Nursing note and vitals reviewed.    ED Treatments / Results  Labs (all labs ordered are listed, but only abnormal results are displayed) Labs Reviewed  CBC WITH DIFFERENTIAL/PLATELET - Abnormal; Notable for the following:       Result Value   Monocytes Absolute 1.1 (*)    All other components within normal limits  URINALYSIS, ROUTINE W REFLEX MICROSCOPIC - Abnormal; Notable for the following:    Specific Gravity, Urine 1.031 (*)    Glucose, UA >=500 (*)    All other components within normal limits  BASIC METABOLIC PANEL - Abnormal; Notable for the following:    Sodium 130 (*)    Chloride 98 (*)    Glucose, Bld 547 (*)    BUN 34 (*)    Creatinine, Ser 1.46 (*)    GFR calc non Af Amer 42 (*)    GFR calc Af Amer 49 (*)    All other components within normal limits  CBG MONITORING, ED - Abnormal; Notable for the following:    Glucose-Capillary 498 (*)    All other components within normal limits  CBG MONITORING, ED - Abnormal; Notable for the following:    Glucose-Capillary 421 (*)    All other components within normal limits  CBG MONITORING, ED - Abnormal; Notable for the following:    Glucose-Capillary 408 (*)    All  other components within normal limits  CBG MONITORING, ED - Abnormal; Notable for the following:    Glucose-Capillary 334 (*)    All other components within normal limits  TROPONIN I  URINALYSIS, MICROSCOPIC (REFLEX)  EKG  EKG Interpretation  Date/Time:  Friday April 19 2016 00:37:54 EDT Ventricular Rate:  100 PR Interval:    QRS Duration: 109 QT Interval:  361 QTC Calculation: 464 R Axis:   95 Text Interpretation:  Sinus tachycardia Ventricular premature complex Borderline prolonged PR interval Right axis deviation Low voltage, precordial leads Baseline wander in lead(s) V1 No significant change since last tracing Confirmed by POLLINA  MD, CHRISTOPHER [phone]) on 04/19/2016 12:43:08 AM       Radiology Dg Chest 2 View  Result Date: 04/19/2016 CLINICAL DATA:  Shortness of breath. EXAM: CHEST  2 VIEW COMPARISON:  Radiographs 01/10/2015 FINDINGS: Chronic hyperinflation. Again seen emphysema. Bibasilar interstitial crowding and atelectasis, chronic. Presumed monitoring lead projects over the left upper chest. Unchanged heart size and mediastinal contours with thoracic aortic atherosclerosis. No confluent pneumonia. No pleural fluid or pneumothorax. No acute osseous abnormalities are seen. IMPRESSION: Emphysema and chronic hyperinflation. Chronic bibasilar crowding and atelectasis. No acute abnormality. Thoracic aortic atherosclerosis. Electronically Signed   By: Jeb Levering M.D.   On: 04/19/2016 01:24    Procedures Procedures (including critical care time)  Medications Ordered in ED Medications  sodium chloride 0.9 % bolus 500 mL (0 mLs Intravenous Stopped 04/19/16 0259)  insulin regular (NOVOLIN R,HUMULIN R) 250 units/2.65mL (100 units/mL) injection 15 Units (15 Units Subcutaneous Given 04/19/16 0050)  sodium chloride 0.9 % bolus 500 mL (0 mLs Intravenous Stopped 04/19/16 0202)  insulin regular (NOVOLIN R,HUMULIN R) 250 units/2.79mL (100 units/mL) injection 15 Units (15 Units  Subcutaneous Given 04/19/16 0224)  sodium chloride 0.9 % bolus 1,000 mL (0 mLs Intravenous Stopped 04/19/16 0506)  insulin aspart (novoLOG) injection 10 Units (10 Units Intravenous Given 04/19/16 0353)     Initial Impression / Assessment and Plan / ED Course  I have reviewed the triage vital signs and the nursing notes.  Pertinent labs & imaging results that were available during my care of the patient were reviewed by me and considered in my medical decision making (see chart for details).     Patient presents for evaluation of elevated blood sugar. Patient admits that he has not been following his diet. He has been drinking milkshakes and Gatorade, eating whatever he wants. His examination was unremarkable at arrival. Lab work was also unremarkable other than hyperglycemia without ketosis. Creatinine of 1.46 is at his baseline. BUN, however, was elevated from baseline. This is consistent with dehydration secondary to his hyperglycemia. Patient received 2 L of IV fluids. He was also given doses of insulin with some improvement of his glucose. Suspect that he has not been taking any of his medications. Patient counseled that he needs to restart all his medicines as prescribed, watch his diet and check his blood sugars. Will need to follow-up with primary care doctor as soon as possible, might need initiation of second oral agent or insulin for better glucose control.  Final Clinical Impressions(s) / ED Diagnoses   Final diagnoses:  Hyperglycemia  Dehydration    New Prescriptions New Prescriptions   No medications on file     Orpah Greek, MD 04/19/16 [phone]

## 2016-04-22 ENCOUNTER — Other Ambulatory Visit: Payer: Self-pay | Admitting: Medical

## 2016-04-23 ENCOUNTER — Encounter: Payer: Self-pay | Admitting: *Deleted

## 2016-04-23 NOTE — Progress Notes (Signed)
Letter Mailed, provider has spoken with patient/SLS 04/03

## 2016-04-24 ENCOUNTER — Ambulatory Visit: Payer: Medicare Other | Admitting: Physical Therapy

## 2016-04-25 ENCOUNTER — Encounter: Payer: Self-pay | Admitting: Medical

## 2016-04-25 ENCOUNTER — Ambulatory Visit (INDEPENDENT_AMBULATORY_CARE_PROVIDER_SITE_OTHER): Payer: Medicare Other | Admitting: Medical

## 2016-04-25 VITALS — BP 118/102 | HR 103 | Temp 97.6°F | Ht 72.0 in | Wt 248.6 lb

## 2016-04-25 DIAGNOSIS — M2550 Pain in unspecified joint: Secondary | ICD-10-CM | POA: Diagnosis not present

## 2016-04-25 DIAGNOSIS — F32A Depression, unspecified: Secondary | ICD-10-CM

## 2016-04-25 DIAGNOSIS — F329 Major depressive disorder, single episode, unspecified: Secondary | ICD-10-CM | POA: Diagnosis not present

## 2016-04-25 DIAGNOSIS — E119 Type 2 diabetes mellitus without complications: Secondary | ICD-10-CM

## 2016-04-25 LAB — COMPLETE METABOLIC PANEL WITH GFR
ALT: 35 U/L (ref 9–46)
AST: 21 U/L (ref 10–35)
Albumin: 4.3 g/dL (ref 3.6–5.1)
Alkaline Phosphatase: 108 U/L (ref 40–115)
BUN: 21 mg/dL (ref 7–25)
CHLORIDE: 100 mmol/L (ref 98–110)
CO2: 23 mmol/L (ref 20–31)
Calcium: 10.7 mg/dL — ABNORMAL HIGH (ref 8.6–10.3)
Creat: 1.45 mg/dL — ABNORMAL HIGH (ref 0.70–1.11)
GFR, Est African American: 51 mL/min — ABNORMAL LOW (ref >=60)
GFR, Est Non African American: 44 mL/min — ABNORMAL LOW (ref >=60)
GLUCOSE: 291 mg/dL — AB (ref 65–99)
POTASSIUM: 4.2 mmol/L (ref 3.5–5.3)
SODIUM: 137 mmol/L (ref 135–146)
Total Bilirubin: 0.6 mg/dL (ref 0.2–1.2)
Total Protein: 6.9 g/dL (ref 6.1–8.1)

## 2016-04-25 MED ORDER — VENLAFAXINE HCL ER 37.5 MG PO CP24: 37.5000 mg | ORAL_CAPSULE | Freq: Every day | ORAL | 0 refills | Status: DC

## 2016-04-25 MED ORDER — ATORVASTATIN CALCIUM 40 MG PO TABS: ORAL_TABLET | ORAL | 0 refills | Status: DC

## 2016-04-25 MED ORDER — MISOPROSTOL 100 MCG PO TABS: ORAL_TABLET | ORAL | 2 refills | Status: DC

## 2016-04-25 MED ORDER — CANAGLIFLOZIN 100 MG PO TABS: 100.0000 mg | ORAL_TABLET | Freq: Every day | ORAL | 0 refills | Status: DC

## 2016-04-25 MED ORDER — CELECOXIB 200 MG PO CAPS: ORAL_CAPSULE | ORAL | 1 refills | Status: DC

## 2016-04-25 MED FILL — INVOKANA 100 MG TABLET: 100 | 30 days supply | Qty: 30 | Fill #0

## 2016-04-25 NOTE — Progress Notes (Signed)
Subjective:    Patient ID: Lance Powell, male    DOB: 1929/03/13, 83 y.o.   MRN: [id number]  HPI  Pt in for follow up.  Pt sugars have been very high night that I called him. Sugar was 557 range. He was not checking his blood sugars and was eating candies and drinking gatorade days prior to hight sugar level.  I had talked to pt on Thursday past week late at night when I advised ED evaluation. He was given IV fluids, insulin  and then discharged.  Pt is on metformin 500 mg tid.   Pt sugars was 279 this am fasting. Last night sugar was 320.(sugars have been high 200's-low 300's)  Pt has changed his diet. Pt daughter friend has explained healthy diet to her dad.    Review of Systems  Constitutional: Negative for chills, fatigue and fever.  Respiratory: Negative for cough, choking, chest tightness, shortness of breath and wheezing.   Cardiovascular: Negative for chest pain and palpitations.  Gastrointestinal: Negative for abdominal pain.  Skin: Negative for pallor and rash.  Neurological: Negative for dizziness and headaches.  Hematological: Negative for adenopathy. Does not bruise/bleed easily.  Psychiatric/Behavioral: Negative for behavioral problems, confusion, sleep disturbance and suicidal ideas. The patient is not nervous/anxious.     Past Medical History:  Diagnosis Date  . COPD (chronic obstructive pulmonary disease) ([place])   . Depression 03/08/2014  . Diabetes mellitus   . GERD (gastroesophageal reflux disease) 03/08/2014  . History of blood transfusion   . Hyperlipemia   . Hypertension   . OSA (obstructive sleep apnea)   . Osteoarthritis   . Rheumatoid arthritis(714.0)   . Sleep apnea    cpap     Social History   Social History  . Marital status: Married    Spouse name: N/A  . Number of children: 6  . Years of education: N/A   Occupational History  . Retired     Chief Financial Officer   Social History Main Topics  . Smoking status: Former Smoker    Packs/day:  4.00    Years: 30.00    Types: Cigarettes    Quit date: 01/21/1978  . Smokeless tobacco: Never Used  . Alcohol use No  . Drug use: No  . Sexual activity: Not on file   Other Topics Concern  . Not on file   Social History Narrative  . No narrative on file    Past Surgical History:  Procedure Laterality Date  . APPENDECTOMY  1969  . CHOLECYSTECTOMY N/A 06/16/2014   Procedure: LAPAROSCOPIC CHOLECYSTECTOMY;  Surgeon: Greer Pickerel, MD;  Location: [facility];  Service: General;  Laterality: N/A;  . CYSTOSCOPY  01/23/2011   Procedure: CYSTOSCOPY;  Surgeon: Molli Hazard, MD;  Location[facility];  Service: Urology;  Laterality: N/A;  . FLEXIBLE SIGMOIDOSCOPY  01/07/2012   Procedure: FLEXIBLE SIGMOIDOSCOPY;  Surgeon: Ladene Artist, MD,FACG;  Location: [facility];  Service: Endoscopy;  Laterality: N/A;  . HERNIA REPAIR  1990   right and left inguinal  . NISSEN FUNDOPLICATION    . 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 TONSILLECTOMY  1937  . TRANSURETHRAL RESECTION OF PROSTATE  01/23/2011   Procedure: TRANSURETHRAL RESECTION OF THE PROSTATE WITH GYRUS INSTRUMENTS;  Surgeon: Molli Hazard, MD;  Location: [facility];  Service: Urology;  Laterality: N/A;  . VASECTOMY  1972    Family History  Problem Relation Age of Onset  . Heart disease Mother   .  Emphysema Brother   . Cancer Daughter     breast    No Known Allergies  Current Outpatient Prescriptions on File Prior to Visit  Medication Sig Dispense Refill  . albuterol (PROVENTIL HFA;VENTOLIN HFA) 108 (90 BASE) MCG/ACT inhaler Inhale 2 puffs into the lungs every 6 (six) hours as needed for wheezing or shortness of breath. 1 Inhaler 2  . aspirin 81 MG tablet Take 1 tablet (81 mg total) by mouth every morning. Stop for 1 week and then resume (Patient taking differently: Take 81 mg by mouth every morning. )    . cholecalciferol (VITAMIN D) 1000 UNITS tablet Take 2,000 Units by mouth 2  (two) times daily.     . clonazePAM (KLONOPIN) 0.25 MG disintegrating tablet 1 tab po q hs prn insomnia or anxiety 10 tablet 2  . enalapril (VASOTEC) 2.5 MG tablet Take 2.5 mg by mouth every morning. Reported on 07/12/2015    . Indacaterol Maleate (ARCAPTA NEOHALER) 75 MCG CAPS Place 1 capsule into inhaler and inhale every morning. 90 capsule 1  . lidocaine (LIDODERM) 5 % Place 1 patch onto the skin daily. Remove & Discard patch within 12 hours or as directed by MD 30 patch 0  . meclizine (ANTIVERT) 25 MG tablet Take 1 tablet (25 mg total) by mouth 3 (three) times daily as needed for dizziness. 20 tablet 0  . metFORMIN (GLUCOPHAGE) 500 MG tablet Take 1 tablet (500 mg total) by mouth 3 (three) times daily. 90 tablet 0  . Multiple Vitamins-Minerals (CENTRUM) tablet Take 1 tablet by mouth every morning. Reported on 01/05/2015    . OVER THE COUNTER MEDICATION Place 1-2 drops into both eyes daily as needed (dry red eyes.). Reported on 01/05/2015    . Tiotropium Bromide-Olodaterol (STIOLTO RESPIMAT) 2.5-2.5 MCG/ACT AERS Inhale 2 puffs into the lungs daily. 4 g 5  . Tiotropium Bromide-Olodaterol (STIOLTO RESPIMAT) 2.5-2.5 MCG/ACT AERS Inhale 2 puffs into the lungs daily. 4 g 2  . tobramycin (TOBREX) 0.3 % ophthalmic solution Place 2 drops into the left eye every 6 (six) hours. 5 mL 0  . UNABLE TO FIND CPAP    . vitamin C (ASCORBIC ACID) 500 MG tablet Take 500 mg by mouth every morning.      No current facility-administered medications on file prior to visit.     BP (!) 118/102 (BP Location: Right Arm, Patient Position: Sitting, Cuff Size: Large)   Pulse (!) 103   Temp 97.6 F (36.4 C) (Oral)   Ht 6' (1.829 m)   Wt 248 lb 9.6 oz (112.8 kg)   SpO2 96%   BMI 33.72 kg/m       Objective:   Physical Exam  General- No acute distress. Pleasant patient. Neck- Full range of motion, no jvd Lungs- Clear, even and unlabored. Heart- regular rate and rhythm. Neurologic- CNII- XII grossly  intact.  Abdomen- soft, nontender, nondistended. +bs. Back- no cva tenderness.        Assessment & Plan:  For diabetes continue metformin 1 tab 3 times a day. Will rx invokana 100 mg a day. Low sugar diet and will try to refer for diabetic counseling.  Will get cmp today.  For your mood will rx effexor. Stop paxil.  For your bodyaches will continue celebrex and rx misoprostol.  Follow up  In in 10 days or as needed  Check blood sugar morning fasting and in evening. Also if you feel poorly check sugar.  If sugars not improving might consider referral to  endocrinologist.  Total of 40 minutes spent with pt answering question on refills of medications of his various conditions. Explained reasoning behind writing meds as well as precautions. Counseled on plan going forward regarding his diabetes.

## 2016-04-25 NOTE — Progress Notes (Signed)
Pre visit review using our clinic review tool, if applicable. No additional management support is needed unless otherwise documented below in the visit note. 

## 2016-04-25 NOTE — Patient Instructions (Addendum)
For diabetes continue metformin 1 tab 3 times a day. Will rx invokana 100 mg a day. Low sugar diet and will try to refer for diabetic counseling.  Will get cmp today.  For your mood will rx effexor. Stop paxil.  For your bodyaches will continue celebrex and rx misoprostol.  Follow up  In in 10 days or as needed  Check blood sugar morning fasting and in evening. Also if you feel poorly check sugar.  If sugars not improving might consider referral to endocrinologist.

## 2016-04-30 DIAGNOSIS — J449 Chronic obstructive pulmonary disease, unspecified: Secondary | ICD-10-CM | POA: Diagnosis not present

## 2016-04-30 DIAGNOSIS — G4733 Obstructive sleep apnea (adult) (pediatric): Secondary | ICD-10-CM | POA: Diagnosis not present

## 2016-05-06 ENCOUNTER — Telehealth: Payer: Self-pay | Admitting: Medical

## 2016-05-06 ENCOUNTER — Ambulatory Visit: Payer: Medicare Other | Attending: Medical | Admitting: Physical Therapy

## 2016-05-06 DIAGNOSIS — R2681 Unsteadiness on feet: Secondary | ICD-10-CM | POA: Insufficient documentation

## 2016-05-06 DIAGNOSIS — R2689 Other abnormalities of gait and mobility: Secondary | ICD-10-CM | POA: Diagnosis not present

## 2016-05-06 NOTE — Therapy (Signed)
[facility] [address]  [place], [place], [address] Phone: [phone]   Fax:  [phone]  Physical Therapy Evaluation  Patient Details  Name: Lance Powell MRN: [id number] Date of Birth: [birth year] Referring Provider: [name] PA-C  Encounter Date: 05/06/2016      PT End of Session - 05/06/16 1431    Visit Number 1   Number of Visits 16   Date for PT Re-Evaluation 07/01/16   Authorization Type Medicare   PT Start Time 0917   PT Stop Time 1006   PT Time Calculation (min) 49 min   Activity Tolerance Patient tolerated treatment well   Behavior During Therapy [facility] for tasks assessed/performed      Past Medical History:  Diagnosis Date  . COPD (chronic obstructive pulmonary disease) ([place])   . Depression 03/08/2014  . Diabetes mellitus   . GERD (gastroesophageal reflux disease) 03/08/2014  . History of blood transfusion   . Hyperlipemia   . Hypertension   . OSA (obstructive sleep apnea)   . Osteoarthritis   . Rheumatoid arthritis(714.0)   . Sleep apnea    cpap    Past Surgical History:  Procedure Laterality Date  . APPENDECTOMY  1969  . CHOLECYSTECTOMY N/A 06/16/2014   Procedure: LAPAROSCOPIC CHOLECYSTECTOMY;  Surgeon: Greer Pickerel, MD;  Location[facility];  Service: General;  Laterality: N/A;  . CYSTOSCOPY  01/23/2011   Procedure: CYSTOSCOPY;  Surgeon: Molli Hazard, MD;  Location: [facility];  Service: Urology;  Laterality: N/A;  . FLEXIBLE SIGMOIDOSCOPY  01/07/2012   Procedure: FLEXIBLE SIGMOIDOSCOPY;  Surgeon: Ladene Artist, MD,FACG;  [facility];  Service: Endoscopy;  Laterality: N/A;  . HERNIA REPAIR  1990   right and left inguinal  . NISSEN FUNDOPLICATION    . 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 TONSILLECTOMY  1937  . TRANSURETHRAL RESECTION OF PROSTATE  01/23/2011   Procedure: TRANSURETHRAL RESECTION OF THE PROSTATE WITH GYRUS INSTRUMENTS;   Surgeon: Molli Hazard, MD;  Location: [facility];  Service: Urology;  Laterality: N/A;  . VASECTOMY  1972    There were no vitals filed for this visit.       Subjective Assessment - 05/06/16 0917    Subjective Patient reports sedentary lifestyle. Feels like he has been depressed due to wifes passing 14 months ago. Patient feels like its hard to walk - due to weakness and breathing issues. Does limited household activities. Reports no issues with ADLs. Does report intermittenly high blood sugars - of which he has started a new medication.    Pertinent History 2L O2 - continuous   Limitations Standing;Walking   Patient Stated Goals improve endurance and strength.    Currently in Pain? No/denies   Multiple Pain Sites No            [facility] PT Assessment - 05/06/16 0933      Assessment   Medical Diagnosis Poor Mobility   Referring Provider [name] PA-C   Next MD Visit 05/15/16   Prior Therapy no     Precautions   Precautions None     Restrictions   Weight Bearing Restrictions No     Balance Screen   Has the patient fallen in the past 6 months No   Has the patient had a decrease in activity level because of a fear of falling?  No   Is the patient reluctant to leave their home because  of a fear of falling?  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 [employer]   [place]   Overall Cognitive Status Within Functional Limits for tasks assessed     Observation/Other Assessments   Focus on Therapeutic Outcomes (FOTO)  Neuro: 55 (45% limited, predicted 40% limited)     Sensation   Light Touch Appears Intact      Coordination   Gross Motor Movements are Fluid and Coordinated Yes     Posture/Postural Control   Posture/Postural Control Postural limitations   Postural Limitations Rounded Shoulders;Forward head     ROM / Strength   AROM / PROM / Strength AROM;Strength     AROM   Overall AROM  Within functional limits for tasks performed     Strength   Strength Assessment Site Hip;Knee;Ankle   Right/Left Hip Right;Left   Right Hip Flexion 4/5   Left Hip Flexion 4/5   Right/Left Knee Right;Left   Right Knee Flexion 4+/5   Right Knee Extension 4+/5   Left Knee Flexion 4+/5   Left Knee Extension 4+/5   Right/Left Ankle Right;Left   Right Ankle Dorsiflexion 4-/5   Left Ankle Dorsiflexion 4-/5     Transfers   Five time sit to stand comments  10.93     Ambulation/Gait   Ambulation/Gait Yes   Ambulation/Gait Assistance 6: Modified independent (Device/Increase time)   Ambulation Distance (Feet) 150 Feet   Assistive device None  O2 carrier   Gait Pattern Step-through pattern;Wide base of support   Ambulation Surface Level;Indoor   Gait velocity 2.63 feet/second     Standardized Balance Assessment   Standardized Balance Assessment Berg Balance Test;Timed Up and Go Test;Five Times Sit to Stand   Five times sit to stand comments  10.93     Berg Balance Test   Sit to Stand Able to stand without using hands and stabilize independently   Standing Unsupported Able to stand safely 2 minutes   Sitting with Back Unsupported but Feet Supported on Floor or Stool Able to sit safely and securely 2 minutes   Stand to Sit Controls descent by using hands   Transfers Able to transfer safely, minor use of hands   Standing Unsupported with Eyes Closed Able to stand 10 seconds with supervision   Standing Ubsupported with Feet Together Able to place feet together independently and stand for 1 minute with supervision   From Standing, Reach Forward with Outstretched Arm Can reach confidently >25 cm (10")    From Standing Position, Pick up Object from Floor Able to pick up shoe, needs supervision   From Standing Position, Turn to Look Behind Over each Shoulder Looks behind from both sides and weight shifts well   Turn 360 Degrees Able to turn 360 degrees safely but slowly   Standing Unsupported, Alternately Place Feet on Step/Stool Able to stand independently and complete 8 steps >20 seconds   Standing Unsupported, One Foot in [employer] balance while stepping or standing   Standing on One Leg Tries to lift leg/unable to hold 3 seconds but remains standing  independently   Total Score 42     Timed Up and Go Test   Normal TUG (seconds) 13.63  with O2 management   Manual TUG (seconds) 12.1                           PT Education - 05/06/16 1430    Education provided Yes   Education Details exam findings, POC   Person(s) Educated Patient   Methods Explanation;Demonstration   Comprehension Verbalized understanding;Returned demonstration          PT Short Term Goals - 05/06/16 1432      PT SHORT TERM GOAL #1   Title Patient to be independent with initial HEP consisting of balance and strengthening (06/03/16)   Status New           PT Long Term Goals - 05/06/16 1433      PT LONG TERM GOAL #1   Title Patient to be independent with advanced HEP (07/01/16)   Status New     PT LONG TERM GOAL #2   Title Patient to improve B LE strength to at least 4+/5 (07/01/16)   Status New     PT LONG TERM GOAL #3   Title Patient to improve Berg Balance to >/= 52/56 (07/01/16)   Status New     PT LONG TERM GOAL #4   Title Patient to report initiation and 3-5 day/week compliance with walking program for improved endurance and functional mobility (07/01/16)   Status New     PT LONG TERM GOAL #5   Title Patient to verbalize community resources available to him (07/01/16)   Status New               Plan - 05/06/16 1431    Clinical Impression Statement Patient is a 83  y/o male presenting to [place] today with primary complaints of reduced endurance, strength and general activity tolerance - of which he attributes to reduction in exercise and mobility. Patient today with 2L of O2, which her reports continuous daily use. Balance and general mobility testing completed today with patient placing in moderate fall risk with Berg and gait velocity. Patient to benefit from PT to establish good balance and strengthening home program as well as to promote a wlaking program and general exercise program for improved functional mobility.    Rehab Potential Good   PT Frequency 2x / week   PT Duration 8 weeks   PT Treatment/Interventions ADLs/Self Care Home Management;Cryotherapy;Electrical Stimulation;Moist Heat;Ultrasound;Neuromuscular re-education;Balance training;Therapeutic exercise;Therapeutic activities;Functional mobility training;Stair training;Gait training;DME Instruction;Patient/family education;Manual techniques;Vasopneumatic Device;Taping;Dry needling;Energy conservation   PT Next Visit Plan balance, strengthening, possible OTAGO   Consulted and Agree with Plan of Care Patient      Patient will benefit from skilled therapeutic intervention in order to improve the following deficits and impairments:  Decreased activity tolerance, Decreased balance, Cardiopulmonary status limiting activity, Decreased mobility, Decreased strength, Difficulty walking  Visit Diagnosis: Other abnormalities of gait and mobility - Plan: PT plan of care cert/re-cert  Unsteadiness on feet - Plan: PT plan of care cert/re-cert      G-Codes - 62/13/08 1437    Functional Assessment Tool Used (Outpatient Only) FOTO: 55 (45% limited)   Functional Limitation Mobility: Walking and moving around   Mobility: Walking and Moving Around Current Status (M5784) At least 40 percent but less than 60 percent impaired, limited or restricted   Mobility: Walking and Moving Around Goal Status (O9629) At least  40 percent but less than 60 percent impaired, limited or restricted       Problem List Patient Active Problem List   Diagnosis Date Noted  . Hip pain 07/27/2014  . Abrasion 07/27/2014  . Insomnia 06/27/2014  . Chronic cholecystitis 06/16/2014  . Coccyx pain 03/30/2014  . Diabetes mellitus type II, controlled ([place]) 03/08/2014  . Depression 03/08/2014  . GERD (gastroesophageal reflux disease) 03/08/2014  . Hyperlipidemia 03/08/2014  . Abdominal pain   . Diverticulitis of large intestine without perforation or abscess without bleeding   . Blood poisoning   . Calculus of gallbladder with acute cholecystitis 02/27/2014  . Acute cholecystitis   . Diverticulitis 02/25/2014  . Urinary retention 02/25/2014  . Hypertension 02/25/2014  . SBO (small bowel obstruction) ([place]) 02/25/2014  . Sepsis ([place]) 02/25/2014  . Cholecystitis 02/25/2014  . Personal history of colonic polyps 01/06/2012  . Special screening for malignant neoplasms, colon 11/15/2011  . Pruritus ani 11/15/2011  . COPD (chronic obstructive pulmonary disease) ([place]) 01/11/2011  . DOE (dyspnea on exertion) 01/01/2011  . Fissure in ano 12/21/2010  . HYPERLIPIDEMIA 01/11/2009  . Obstructive sleep apnea 01/11/2009  . ARTHRITIS, RHEUMATOID 01/11/2009  . OSTEOARTHRITIS 01/11/2009     Lanney Gins, PT, DPT 05/06/16 3:48 PM   [facility] [address]  [place], [place], [address] Phone: [phone]   Fax:  [phone]  Name: MAXAMILIAN AMADON MRN: [id number] Date of Birth: 1929/10/25

## 2016-05-06 NOTE — Telephone Encounter (Signed)
Caller name: Loney  Relation to pt: self Call back number: [phone] Pharmacy: [facility], [place]  Reason for call: Pt came in office requesting needing refill on Blood sugar test strips. Please advise.

## 2016-05-07 ENCOUNTER — Other Ambulatory Visit: Payer: Self-pay

## 2016-05-07 NOTE — Telephone Encounter (Signed)
Routed to Provider, pharmacy need new order faxed.

## 2016-05-08 ENCOUNTER — Ambulatory Visit (INDEPENDENT_AMBULATORY_CARE_PROVIDER_SITE_OTHER): Payer: Medicare Other | Admitting: Medical

## 2016-05-08 ENCOUNTER — Encounter: Payer: Self-pay | Admitting: Medical

## 2016-05-08 ENCOUNTER — Ambulatory Visit: Payer: Medicare Other | Admitting: Medical

## 2016-05-08 ENCOUNTER — Ambulatory Visit: Payer: Medicare Other | Admitting: Physical Therapy

## 2016-05-08 VITALS — BP 126/80 | HR 102 | Temp 97.5°F | Resp 14 | Ht 72.0 in | Wt 241.8 lb

## 2016-05-08 DIAGNOSIS — R2689 Other abnormalities of gait and mobility: Secondary | ICD-10-CM | POA: Diagnosis not present

## 2016-05-08 DIAGNOSIS — E119 Type 2 diabetes mellitus without complications: Secondary | ICD-10-CM | POA: Diagnosis not present

## 2016-05-08 DIAGNOSIS — R2681 Unsteadiness on feet: Secondary | ICD-10-CM

## 2016-05-08 MED ORDER — GLUCOSE BLOOD VI STRP: ORAL_STRIP | 12 refills | Status: DC

## 2016-05-08 MED ORDER — ALBUTEROL SULFATE HFA 108 (90 BASE) MCG/ACT IN AERS: 2.0000 | INHALATION_SPRAY | Freq: Four times a day (QID) | RESPIRATORY_TRACT | 2 refills | Status: DC | PRN

## 2016-05-08 NOTE — Therapy (Signed)
[facility] [address]  [place], [place], [address] Phone: [phone]   Fax:  [phone]  Physical Therapy Treatment  Patient Details  Name: Lance Powell MRN: [id number] Date of Birth: [birth year] Referring Provider: [name] PA-C  Encounter Date: 05/08/2016      PT End of Session - 05/08/16 1319    Visit Number 2   Number of Visits 16   Date for PT Re-Evaluation 07/01/16   Authorization Type Medicare   PT Start Time 1315   PT Stop Time 1359   PT Time Calculation (min) 44 min   Activity Tolerance Patient tolerated treatment well   Behavior During Therapy [facility] for tasks assessed/performed      Past Medical History:  Diagnosis Date  . COPD (chronic obstructive pulmonary disease) ([place])   . Depression 03/08/2014  . Diabetes mellitus   . GERD (gastroesophageal reflux disease) 03/08/2014  . History of blood transfusion   . Hyperlipemia   . Hypertension   . OSA (obstructive sleep apnea)   . Osteoarthritis   . Rheumatoid arthritis(714.0)   . Sleep apnea    cpap    Past Surgical History:  Procedure Laterality Date  . APPENDECTOMY  1969  . CHOLECYSTECTOMY N/A 06/16/2014   Procedure: LAPAROSCOPIC CHOLECYSTECTOMY;  Surgeon: Greer Pickerel, MD;  Location: [facility];  Service: General;  Laterality: N/A;  . CYSTOSCOPY  01/23/2011   Procedure: CYSTOSCOPY;  Surgeon: Molli Hazard, MD;  Location: [facility];  Service: Urology;  Laterality: N/A;  . FLEXIBLE SIGMOIDOSCOPY  01/07/2012   Procedure: FLEXIBLE SIGMOIDOSCOPY;  Surgeon: Ladene Artist, MD,FACG;  [facility];  Service: Endoscopy;  Laterality: N/A;  . HERNIA REPAIR  1990   right and left inguinal  . NISSEN FUNDOPLICATION    . 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 TONSILLECTOMY  1937  . TRANSURETHRAL RESECTION OF PROSTATE  01/23/2011   Procedure: TRANSURETHRAL RESECTION OF THE PROSTATE WITH GYRUS INSTRUMENTS;   Surgeon: Molli Hazard, MD;  Location: [facility];  Service: Urology;  Laterality: N/A;  . VASECTOMY  1972    There were no vitals filed for this visit.      Subjective Assessment - 05/08/16 1318    Subjective Doing well today - wants to start exercising   Pertinent History 2L O2 - continuous   Patient Stated Goals improve endurance and strength.    Currently in Pain? No/denies   Multiple Pain Sites No                         [facility] Adult PT Treatment/Exercise - 05/08/16 1320      Exercises   Exercises Knee/Hip     Knee/Hip Exercises: Aerobic   Nustep L5 x 5 minutes - UE and LE     Knee/Hip Exercises: Standing   Heel Raises 15 reps   Heel Raises Limitations UE support   Functional Squat 15 reps   Functional Squat Limitations UE support     Knee/Hip Exercises: Seated   Long Arc Quad Strengthening;Both;1 set;15 reps;Weights   Long Arc Quad Weight 2 lbs.   Other Seated Knee/Hip Exercises Fitter - 1 blue/1 black - B LE 2 x 15 reps   Marching Strengthening;Both;15 reps;Weights   Marching Weights 2 lbs.   Hamstring Curl Strengthening;Both;15 reps   Hamstring Limitations red tband   Abduction/Adduction  Strengthening;Both;15 reps   Abd/Adduction Limitations adduction -  3 second hold   Sit to Sand 15 reps;with UE support  standing on AirEx     Knee/Hip Exercises: Supine   Bridges 15 reps   Straight Leg Raises Strengthening;Both;15 reps                  PT Short Term Goals - 05/06/16 1432      PT SHORT TERM GOAL #1   Title Patient to be independent with initial HEP consisting of balance and strengthening (06/03/16)   Status New           PT Long Term Goals - 05/06/16 1433      PT LONG TERM GOAL #1   Title Patient to be independent with advanced HEP (07/01/16)   Status New     PT LONG TERM GOAL #2   Title Patient to improve B LE strength to at least 4+/5 (07/01/16)   Status New     PT LONG TERM GOAL #3   Title  Patient to improve Berg Balance to >/= 52/56 (07/01/16)   Status New     PT LONG TERM GOAL #4   Title Patient to report initiation and 3-5 day/week compliance with walking program for improved endurance and functional mobility (07/01/16)   Status New     PT LONG TERM GOAL #5   Title Patient to verbalize community resources available to him (07/01/16)   Status New               Plan - 05/08/16 1319    Clinical Impression Statement Patient doing well today - saw MD earlier in the day with more controlled blood sugars. Good introduction into strengthening program today - some shortness of breath with sit to stand as well as squat activity - likely due to increased workload and stress placed on cardiovascular system - checked O2 sats with O2 remaining above 90% on 2L O2 with all activities.    PT Treatment/Interventions ADLs/Self Care Home Management;Cryotherapy;Electrical Stimulation;Moist Heat;Ultrasound;Neuromuscular re-education;Balance training;Therapeutic exercise;Therapeutic activities;Functional mobility training;Stair training;Gait training;DME Instruction;Patient/family education;Manual techniques;Vasopneumatic Device;Taping;Dry needling;Energy conservation   PT Next Visit Plan balance, strengthening, possible OTAGO   Consulted and Agree with Plan of Care Patient      Patient will benefit from skilled therapeutic intervention in order to improve the following deficits and impairments:  Decreased activity tolerance, Decreased balance, Cardiopulmonary status limiting activity, Decreased mobility, Decreased strength, Difficulty walking  Visit Diagnosis: Other abnormalities of gait and mobility  Unsteadiness on feet     Problem List Patient Active Problem List   Diagnosis Date Noted  . Hip pain 07/27/2014  . Abrasion 07/27/2014  . Insomnia 06/27/2014  . Chronic cholecystitis 06/16/2014  . Coccyx pain 03/30/2014  . Diabetes mellitus type II, controlled ([place]) 03/08/2014  .  Depression 03/08/2014  . GERD (gastroesophageal reflux disease) 03/08/2014  . Hyperlipidemia 03/08/2014  . Abdominal pain   . Diverticulitis of large intestine without perforation or abscess without bleeding   . Blood poisoning   . Calculus of gallbladder with acute cholecystitis 02/27/2014  . Acute cholecystitis   . Diverticulitis 02/25/2014  . Urinary retention 02/25/2014  . Hypertension 02/25/2014  . SBO (small bowel obstruction) ([place]) 02/25/2014  . Sepsis ([place]) 02/25/2014  . Cholecystitis 02/25/2014  . Personal history of colonic polyps 01/06/2012  . Special screening for malignant neoplasms, colon 11/15/2011  . Pruritus ani 11/15/2011  . COPD (chronic obstructive pulmonary disease) ([place]) 01/11/2011  . DOE (dyspnea on exertion) 01/01/2011  . Fissure in ano 12/21/2010  .  HYPERLIPIDEMIA 01/11/2009  . Obstructive sleep apnea 01/11/2009  . ARTHRITIS, RHEUMATOID 01/11/2009  . OSTEOARTHRITIS 01/11/2009     Lanney Gins, PT, DPT 05/08/16 2:13 PM   [facility] [address]  [place], [place], [address] Phone: [phone]   Fax:  [phone]  Name: Lance Powell MRN: [id number] Date of Birth: 1929-06-25

## 2016-05-08 NOTE — Telephone Encounter (Signed)
Reminder to refill the contour strips as we discussed.

## 2016-05-08 NOTE — Progress Notes (Signed)
Subjective:    Patient ID: Lance Powell, male    DOB: 07/31/1929, 83 y.o.   MRN: [id number]  HPI  Pt in states he feels fine.   Pt is on metformin and invokana.   Pt sugars have improved a lot with meds particularly since adding invokana.  Pt brings up in readings. Last week readings.in 100's.(Readings were 143, 142, 145, 134, 142, 181, 164, 167, 176, 184, 144 , 188 and 155.)  Since I last saw him highest was 200.   Pt checking his sugars twice a day.     Review of Systems  Constitutional: Negative for chills, fatigue and fever.  Respiratory: Negative for cough, chest tightness, shortness of breath and wheezing.   Cardiovascular: Negative for chest pain and palpitations.  Gastrointestinal: Negative for abdominal pain.  Endocrine: Negative for polydipsia, polyphagia and polyuria.  Genitourinary: Negative for enuresis, flank pain, testicular pain and urgency.  Musculoskeletal: Negative for back pain, myalgias and neck pain.  Skin: Negative for rash.  Neurological: Negative for dizziness and headaches.  Hematological: Negative for adenopathy. Does not bruise/bleed easily.  Psychiatric/Behavioral: Negative for behavioral problems and confusion.    Past Medical History:  Diagnosis Date  . COPD (chronic obstructive pulmonary disease) ([place])   . Depression 03/08/2014  . Diabetes mellitus   . GERD (gastroesophageal reflux disease) 03/08/2014  . History of blood transfusion   . Hyperlipemia   . Hypertension   . OSA (obstructive sleep apnea)   . Osteoarthritis   . Rheumatoid arthritis(714.0)   . Sleep apnea    cpap     Social History   Social History  . Marital status: Married    Spouse name: N/A  . Number of children: 6  . Years of education: N/A   Occupational History  . Retired     Chief Financial Officer   Social History Main Topics  . Smoking status: Former Smoker    Packs/day: 4.00    Years: 30.00    Types: Cigarettes    Quit date: 01/21/1978  . Smokeless tobacco:  Never Used  . Alcohol use No  . Drug use: No  . Sexual activity: Not on file   Other Topics Concern  . Not on file   Social History Narrative  . No narrative on file    Past Surgical History:  Procedure Laterality Date  . APPENDECTOMY  1969  . CHOLECYSTECTOMY N/A 06/16/2014   Procedure: LAPAROSCOPIC CHOLECYSTECTOMY;  Surgeon: Greer Pickerel, MD;  [facility];  Service: General;  Laterality: N/A;  . CYSTOSCOPY  01/23/2011   Procedure: CYSTOSCOPY;  Surgeon: Molli Hazard, MD;  Location: [facility];  Service: Urology;  Laterality: N/A;  . FLEXIBLE SIGMOIDOSCOPY  01/07/2012   Procedure: FLEXIBLE SIGMOIDOSCOPY;  Surgeon: Ladene Artist, MD,FACG;  [facility];  Service: Endoscopy;  Laterality: N/A;  . HERNIA REPAIR  1990   right and left inguinal  . NISSEN FUNDOPLICATION    . 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 TONSILLECTOMY  1937  . TRANSURETHRAL RESECTION OF PROSTATE  01/23/2011   Procedure: TRANSURETHRAL RESECTION OF THE PROSTATE WITH GYRUS INSTRUMENTS;  Surgeon: Molli Hazard, MD;  Location: [facility];  Service: Urology;  Laterality: N/A;  . VASECTOMY  1972    Family History  Problem Relation Age of Onset  . Heart disease Mother   . Emphysema Brother   . Cancer Daughter     breast    No Known Allergies  Current  Outpatient Prescriptions on File Prior to Visit  Medication Sig Dispense Refill  . albuterol (PROVENTIL HFA;VENTOLIN HFA) 108 (90 BASE) MCG/ACT inhaler Inhale 2 puffs into the lungs every 6 (six) hours as needed for wheezing or shortness of breath. 1 Inhaler 2  . aspirin 81 MG tablet Take 1 tablet (81 mg total) by mouth every morning. Stop for 1 week and then resume (Patient taking differently: Take 81 mg by mouth every morning. )    . atorvastatin (LIPITOR) 40 MG tablet TAKE ONE TABLET BY MOUTH ONCE DAILY IN THE MORNING 90 tablet 0  . canagliflozin (INVOKANA) 100 MG TABS tablet Take 1 tablet (100  mg total) by mouth daily before breakfast. 30 tablet 0  . celecoxib (CELEBREX) 200 MG capsule TAKE ONE CAPSULE BY MOUTH ONCE DAILY IN THE EVENING 30 capsule 1  . cholecalciferol (VITAMIN D) 1000 UNITS tablet Take 2,000 Units by mouth 2 (two) times daily.     . clonazePAM (KLONOPIN) 0.25 MG disintegrating tablet 1 tab po q hs prn insomnia or anxiety 10 tablet 2  . enalapril (VASOTEC) 2.5 MG tablet Take 2.5 mg by mouth every morning. Reported on 07/12/2015    . Indacaterol Maleate (ARCAPTA NEOHALER) 75 MCG CAPS Place 1 capsule into inhaler and inhale every morning. 90 capsule 1  . lidocaine (LIDODERM) 5 % Place 1 patch onto the skin daily. Remove & Discard patch within 12 hours or as directed by MD 30 patch 0  . meclizine (ANTIVERT) 25 MG tablet Take 1 tablet (25 mg total) by mouth 3 (three) times daily as needed for dizziness. 20 tablet 0  . metFORMIN (GLUCOPHAGE) 500 MG tablet Take 1 tablet (500 mg total) by mouth 3 (three) times daily. 90 tablet 0  . misoprostol (CYTOTEC) 100 MCG tablet TAKE ONE TABLET BY MOUTH ONCE DAILY IN THE MORNING, ONE TABLET ONCE DAILY IN THE EVENING, AND TWO TABLETS ONCE DAILY AT BEDTIME 120 tablet 2  . Multiple Vitamins-Minerals (CENTRUM) tablet Take 1 tablet by mouth every morning. Reported on 01/05/2015    . OVER THE COUNTER MEDICATION Place 1-2 drops into both eyes daily as needed (dry red eyes.). Reported on 01/05/2015    . Tiotropium Bromide-Olodaterol (STIOLTO RESPIMAT) 2.5-2.5 MCG/ACT AERS Inhale 2 puffs into the lungs daily. 4 g 5  . Tiotropium Bromide-Olodaterol (STIOLTO RESPIMAT) 2.5-2.5 MCG/ACT AERS Inhale 2 puffs into the lungs daily. 4 g 2  . tobramycin (TOBREX) 0.3 % ophthalmic solution Place 2 drops into the left eye every 6 (six) hours. 5 mL 0  . UNABLE TO FIND CPAP    . venlafaxine XR (EFFEXOR-XR) 37.5 MG 24 hr capsule Take 1 capsule (37.5 mg total) by mouth daily with breakfast. 30 capsule 0  . vitamin C (ASCORBIC ACID) 500 MG tablet Take 500 mg by mouth  every morning.      No current facility-administered medications on file prior to visit.     BP 126/80 (BP Location: Right Arm, Patient Position: Sitting, Cuff Size: Large)   Pulse (!) 102   Temp 97.5 F (36.4 C) (Oral)   Resp 14   Ht 6' (1.829 m)   Wt 241 lb 12.8 oz (109.7 kg)   SpO2 97%   BMI 32.79 kg/m       Objective:   Physical Exam  General Mental Status- Alert. General Appearance- Not in acute distress.   Skin General: Color- Normal Color. Moisture- Normal Moisture.  Neck Carotid Arteries- Normal color. Moisture- Normal Moisture. No carotid bruits. No JVD.  Chest and Lung Exam Auscultation: Breath Sounds:-Normal.  Cardiovascular Auscultation:Rythm- Regular. Murmurs & Other Heart Sounds:Auscultation of the heart reveals- No Murmurs.  Abdomen Inspection:-Inspeection Normal. Palpation/Percussion:Note:No mass. Palpation and Percussion of the abdomen reveal- Non Tender, Non Distended + BS, no rebound or guarding.    Neurologic Cranial Nerve exam:- CN III-XII intact(No nystagmus), symmetric smile. Strength:- 5/5 equal and symmetric strength both upper and lower extremities.      Assessment & Plan:  Your diabetes is much better controlled now.  I want you to check sugars twice daily.  Continue metformin and invokana. Will try to find way to decrease cost of invokana.  Also continue low sugar diet.  Follow up in 2  months or as needed  Advsed any sugar reading below 70 or above 200 notify us.  Lavert Matousek, [name], PA-C

## 2016-05-08 NOTE — Patient Instructions (Addendum)
Heel Raises    Stand with support. With knees straight, raise heels off ground. Hold _3-5__ seconds.  Repeat _15_ times. Do __2_ times a day.   FUNCTIONAL MOBILITY: Squat With UE Support    Stand by chair or table. Stance: shoulder-width on floor. Bend hips and knees. Keep back straight. Do not allow knees to bend past toes. Squeeze glutes and quads to stand. _15__ reps per set, _2__ sets per day.    Sit to Stand / Stand to Sit / Transfers    Sit on edge of a solid chair with arms, feet flat on floor. Lean forward over feet and stand up with hands on chair arms. Sit down slowly with hands on chair arms. Repeat _15___ times per session. Do __2__ sessions per day.    Long [employer]    Straighten operated leg and try to hold it __3-5__ seconds.  Repeat __15__ times. Do __2__ sessions a day.    Bridging    Slowly raise buttocks from floor, keeping stomach tight. Repeat _15___ times per set. Do __2__ sets per session.     Straight Leg Raise    Tighten stomach and slowly raise locked right leg _8___ inches from floor. Repeat __15__ times per set. Do __2__ sets per session.

## 2016-05-08 NOTE — Progress Notes (Signed)
Pre visit review using our clinic review tool, if applicable. No additional management support is needed unless otherwise documented below in the visit note. 

## 2016-05-08 NOTE — Patient Instructions (Addendum)
Your diabetes is much better controlled now.  I want you to check sugars twice daily.  Continue metformin and invokana. Will try to find way to decrease cost of invokana.  Also continue low sugar diet.  Follow up in 2  months or as needed  Advsed any sugar reading below 70 or above 200 notify us.

## 2016-05-08 NOTE — Telephone Encounter (Signed)
Rx sent to pharmacy   

## 2016-05-09 ENCOUNTER — Other Ambulatory Visit: Payer: Self-pay | Admitting: Medical

## 2016-05-09 NOTE — Telephone Encounter (Signed)
°  Relation to HN:GITJ  Call back [place]: [place], [place].  Reason for call:  Pharmacy states glucose blood (BAYER CONTOUR TEST) test strip  Prescription was incomplete missing the ICD code and direction, requesting a new Rx, please advise

## 2016-05-10 DIAGNOSIS — E119 Type 2 diabetes mellitus without complications: Secondary | ICD-10-CM | POA: Diagnosis not present

## 2016-05-10 MED ORDER — GLUCOSE BLOOD VI STRP: ORAL_STRIP | 6 refills | Status: DC

## 2016-05-10 MED ORDER — GLUCOSE BLOOD VI STRP: ORAL_STRIP | 5 refills | Status: DC

## 2016-05-10 NOTE — Telephone Encounter (Signed)
Rx re-sent, notified pt.

## 2016-05-10 NOTE — Addendum Note (Signed)
Addended by: Kelle Darting A on: 05/10/2016 11:39 AM   Modules accepted: Orders

## 2016-05-13 ENCOUNTER — Ambulatory Visit: Payer: Medicare Other

## 2016-05-13 DIAGNOSIS — R2689 Other abnormalities of gait and mobility: Secondary | ICD-10-CM | POA: Diagnosis not present

## 2016-05-13 DIAGNOSIS — R2681 Unsteadiness on feet: Secondary | ICD-10-CM

## 2016-05-13 NOTE — Therapy (Signed)
[facility] [address]  [place], [place], [address] Phone: [phone]   Fax:  [phone]  Physical Therapy Treatment  Patient Details  Name: Lance Powell MRN: [id number] Date of Birth: 1929/01/27 Referring Provider: [name] PA-C  Encounter Date: 05/13/2016      PT End of Session - 05/13/16 1543    Visit Number 3   Number of Visits 16   Date for PT Re-Evaluation 07/01/16   Authorization Type Medicare   PT Start Time 4076   PT Stop Time 1616   PT Time Calculation (min) 45 min   Activity Tolerance Patient tolerated treatment well   Behavior During Therapy [facility] for tasks assessed/performed      Past Medical History:  Diagnosis Date  . COPD (chronic obstructive pulmonary disease) ([place])   . Depression 03/08/2014  . Diabetes mellitus   . GERD (gastroesophageal reflux disease) 03/08/2014  . History of blood transfusion   . Hyperlipemia   . Hypertension   . OSA (obstructive sleep apnea)   . Osteoarthritis   . Rheumatoid arthritis(714.0)   . Sleep apnea    cpap    Past Surgical History:  Procedure Laterality Date  . APPENDECTOMY  1969  . CHOLECYSTECTOMY N/A 06/16/2014   Procedure: LAPAROSCOPIC CHOLECYSTECTOMY;  Surgeon: Greer Pickerel, MD;  [facility];  Service: General;  Laterality: N/A;  . CYSTOSCOPY  01/23/2011   Procedure: CYSTOSCOPY;  Surgeon: Molli Hazard, MD;  Location: [facility];  Service: Urology;  Laterality: N/A;  . FLEXIBLE SIGMOIDOSCOPY  01/07/2012   Procedure: FLEXIBLE SIGMOIDOSCOPY;  Surgeon: Ladene Artist, MD,FACG;  [facility];  Service: Endoscopy;  Laterality: N/A;  . HERNIA REPAIR  1990   right and left inguinal  . NISSEN FUNDOPLICATION    . 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 TONSILLECTOMY  1937  . TRANSURETHRAL RESECTION OF PROSTATE  01/23/2011   Procedure: TRANSURETHRAL RESECTION OF THE PROSTATE WITH GYRUS INSTRUMENTS;   Surgeon: Molli Hazard, MD;  Location: [facility];  Service: Urology;  Laterality: N/A;  . VASECTOMY  1972    There were no vitals filed for this visit.      Subjective Assessment - 05/13/16 1543    Subjective No pain or complaints reported   Patient Stated Goals improve endurance and strength.    Currently in Pain? No/denies   Multiple Pain Sites No                         [facility] Adult PT Treatment/Exercise - 05/13/16 1746      Knee/Hip Exercises: Aerobic   Nustep L5 x 5 minutes - UE and LE             Balance Exercises - 05/13/16 1552      OTAGO PROGRAM   Head Movements Standing;5 reps   Neck Movements Standing;5 reps   Back Extension Standing;5 reps   Trunk Movements Standing;5 reps   Ankle Movements Sitting;10 reps   Knee Extensor 10 reps  2# cuffweights    Knee Flexor 10 reps  2#    Hip ABductor --  x 15 reps 2#    Ankle Plantorflexors 20 reps, support   Ankle Dorsiflexors 20 reps, support   Knee Bends 10 reps, support   Backwards Walking Support   Walking and Turning Around No assistive device  instructed to perform at counter at home as best  as possible   Sideways Walking No assistive device  support on counter    Tandem Stance 10 seconds, support   Tandem Walk Support   One Leg Stand 10 seconds, support   Heel Walking Support   Toe Walk Support   Heel Toe Walking Backward --  UE support on counter    Sit to Stand 10 reps, bilateral support   Stair Walking no attempted    Overall OTAGO Comments Pt. instructed to perform at kitchen counter with UE support maintained with all activities           PT Education - 05/13/16 1751    Education provided Yes   Education Details Full OTAGO Balance Program issued to pt. via binder handout    Person(s) Educated Patient   Methods Explanation;Demonstration;Verbal cues;Handout   Comprehension Verbalized understanding;Returned demonstration;Verbal cues required;Need  further instruction          PT Short Term Goals - 05/13/16 1545      PT SHORT TERM GOAL #1   Title Patient to be independent with initial HEP consisting of balance and strengthening (06/03/16)   Status On-going           PT Long Term Goals - 05/13/16 1545      PT LONG TERM GOAL #1   Title Patient to be independent with advanced HEP (07/01/16)   Status On-going     PT LONG TERM GOAL #2   Title Patient to improve B LE strength to at least 4+/5 (07/01/16)   Status On-going     PT LONG TERM GOAL #3   Title Patient to improve Berg Balance to >/= 52/56 (07/01/16)   Status On-going     PT LONG TERM GOAL #4   Title Patient to report initiation and 3-5 day/week compliance with walking program for improved endurance and functional mobility (07/01/16)   Status On-going     PT LONG TERM GOAL #5   Title Patient to verbalize community resources available to him (07/01/16)   Status On-going               Plan - 05/13/16 1546    Clinical Impression Statement Pt. doing well today.  Treatment focusing on performance of OTAGO balance program for incorporation into home program.  Pt. able to perform all OTAGO activities well with UE support at counter top.  2L O2 maintained for all therex today with pt. requiring periodic sitting rest breaks to maintain O2 sats above 90%.  Pt. verbalized plan to start performing OTAGO balance program at home.  Pt. compliant and motivated with today's session and will continue to benefit from further skilled therapy to improve LE strength, balance, and decrease chance of falls.      PT Treatment/Interventions ADLs/Self Care Home Management;Cryotherapy;Electrical Stimulation;Moist Heat;Ultrasound;Neuromuscular re-education;Balance training;Therapeutic exercise;Therapeutic activities;Functional mobility training;Stair training;Gait training;DME Instruction;Patient/family education;Manual techniques;Vasopneumatic Device;Taping;Dry needling;Energy conservation    PT Next Visit Plan Monitor aderence to OTAGO program; balance, strengthening      Patient will benefit from skilled therapeutic intervention in order to improve the following deficits and impairments:  Decreased activity tolerance, Decreased balance, Cardiopulmonary status limiting activity, Decreased mobility, Decreased strength, Difficulty walking  Visit Diagnosis: Other abnormalities of gait and mobility  Unsteadiness on feet     Problem List Patient Active Problem List   Diagnosis Date Noted  . Hip pain 07/27/2014  . Abrasion 07/27/2014  . Insomnia 06/27/2014  . Chronic cholecystitis 06/16/2014  . Coccyx pain 03/30/2014  . Diabetes mellitus type II,  controlled ([place]) 03/08/2014  . Depression 03/08/2014  . GERD (gastroesophageal reflux disease) 03/08/2014  . Hyperlipidemia 03/08/2014  . Abdominal pain   . Diverticulitis of large intestine without perforation or abscess without bleeding   . Blood poisoning   . Calculus of gallbladder with acute cholecystitis 02/27/2014  . Acute cholecystitis   . Diverticulitis 02/25/2014  . Urinary retention 02/25/2014  . Hypertension 02/25/2014  . SBO (small bowel obstruction) ([place]) 02/25/2014  . Sepsis ([place]) 02/25/2014  . Cholecystitis 02/25/2014  . Personal history of colonic polyps 01/06/2012  . Special screening for malignant neoplasms, colon 11/15/2011  . Pruritus ani 11/15/2011  . COPD (chronic obstructive pulmonary disease) ([place]) 01/11/2011  . DOE (dyspnea on exertion) 01/01/2011  . Fissure in ano 12/21/2010  . HYPERLIPIDEMIA 01/11/2009  . Obstructive sleep apnea 01/11/2009  . ARTHRITIS, RHEUMATOID 01/11/2009  . OSTEOARTHRITIS 01/11/2009    Bess Harvest, PTA 05/13/16 5:53 PM  [facility] [address]  [place], [place], [address] Phone: [phone]   Fax:  [phone]  Name: Lance Powell MRN: [id number] Date of Birth: 03/22/1929

## 2016-05-16 ENCOUNTER — Ambulatory Visit: Payer: Medicare Other

## 2016-05-16 DIAGNOSIS — R2681 Unsteadiness on feet: Secondary | ICD-10-CM

## 2016-05-16 DIAGNOSIS — R2689 Other abnormalities of gait and mobility: Secondary | ICD-10-CM | POA: Diagnosis not present

## 2016-05-16 NOTE — Therapy (Signed)
[facility] [address]  [place], [place], [address] Phone: [phone]   Fax:  [phone]  Physical Therapy Treatment  Patient Details  Name: Lance Powell MRN: [id number] Date of Birth: Oct 16, 1929 Referring Provider: [name] PA-C  Encounter Date: 05/16/2016      PT End of Session - 05/16/16 1453    Visit Number 4   Number of Visits 16   Date for PT Re-Evaluation 07/01/16   Authorization Type Medicare   PT Start Time 1446   PT Stop Time 1529   PT Time Calculation (min) 43 min   Activity Tolerance Patient tolerated treatment well   Behavior During Therapy [facility] for tasks assessed/performed      Past Medical History:  Diagnosis Date  . COPD (chronic obstructive pulmonary disease) ([place])   . Depression 03/08/2014  . Diabetes mellitus   . GERD (gastroesophageal reflux disease) 03/08/2014  . History of blood transfusion   . Hyperlipemia   . Hypertension   . OSA (obstructive sleep apnea)   . Osteoarthritis   . Rheumatoid arthritis(714.0)   . Sleep apnea    cpap    Past Surgical History:  Procedure Laterality Date  . APPENDECTOMY  1969  . CHOLECYSTECTOMY N/A 06/16/2014   Procedure: LAPAROSCOPIC CHOLECYSTECTOMY;  Surgeon: Greer Pickerel, MD;  [facility];  Service: General;  Laterality: N/A;  . CYSTOSCOPY  01/23/2011   Procedure: CYSTOSCOPY;  Surgeon: Molli Hazard, MD;  Location: [facility];  Service: Urology;  Laterality: N/A;  . FLEXIBLE SIGMOIDOSCOPY  01/07/2012   Procedure: FLEXIBLE SIGMOIDOSCOPY;  Surgeon: Ladene Artist, MD,FACG;  [facility];  Service: Endoscopy;  Laterality: N/A;  . HERNIA REPAIR  1990   right and left inguinal  . NISSEN FUNDOPLICATION    . 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 TONSILLECTOMY  1937  . TRANSURETHRAL RESECTION OF PROSTATE  01/23/2011   Procedure: TRANSURETHRAL RESECTION OF THE PROSTATE WITH GYRUS INSTRUMENTS;   Surgeon: Molli Hazard, MD;  Location: [facility];  Service: Urology;  Laterality: N/A;  . VASECTOMY  1972    There were no vitals filed for this visit.      Subjective Assessment - 05/16/16 1452    Subjective Pt. reporting doing well however feeling a little poorly due to family member passing away.     Patient Stated Goals improve endurance and strength.    Currently in Pain? No/denies   Multiple Pain Sites No                         [facility] Adult PT Treatment/Exercise - 05/16/16 1509      Knee/Hip Exercises: Aerobic   Nustep L5 x 5 minutes - UE and LE     Knee/Hip Exercises: Standing   Knee Flexion AROM;Right;Left;Strengthening;1 set;15 reps   Knee Flexion Limitations 3#    Hip Flexion AROM;Stengthening;Right;Left;1 set;15 reps;Knee straight   Hip Flexion Limitations 3#; at counter    Hip Abduction AROM;Right;Left;1 set;15 reps;Knee straight   Abduction Limitations 3#; at counter    Hip Extension AROM;Right;Left;Stengthening;1 set;15 reps;Knee straight   Extension Limitations 3#; at counter      Knee/Hip Exercises: Seated   Long Arc Quad Strengthening;Both;1 set;15 reps;Weights   Long Arc Quad Weight 3 lbs.   Ball Squeeze 5" x 15 reps with ball    Clamshell with TheraBand Green  x 20 reps   Other  Seated Knee/Hip Exercises Fitter - 2 black - B LE 2 x 15 reps   Marching Strengthening;Both;15 reps;Weights   Marching Weights 3 lbs.   Hamstring Curl Strengthening;Both;15 reps   Hamstring Limitations green TB    Sit to Sand 2 sets;10 reps  1st set without UE pushoff; 2nd set standing on airex pad                  PT Short Term Goals - 05/13/16 1545      PT SHORT TERM GOAL #1   Title Patient to be independent with initial HEP consisting of balance and strengthening (06/03/16)   Status On-going           PT Long Term Goals - 05/13/16 1545      PT LONG TERM GOAL #1   Title Patient to be independent with advanced HEP  (07/01/16)   Status On-going     PT LONG TERM GOAL #2   Title Patient to improve B LE strength to at least 4+/5 (07/01/16)   Status On-going     PT LONG TERM GOAL #3   Title Patient to improve Berg Balance to >/= 52/56 (07/01/16)   Status On-going     PT LONG TERM GOAL #4   Title Patient to report initiation and 3-5 day/week compliance with walking program for improved endurance and functional mobility (07/01/16)   Status On-going     PT LONG TERM GOAL #5   Title Patient to verbalize community resources available to him (07/01/16)   Status On-going               Plan - 05/16/16 1836    Clinical Impression Statement Pt. doing well today.  Has been performing, "some" of OTAGO balance HEP.  Treatment focusing on hip/knee strengthening in standing and sitting per tolerance.  Sitting exercises performed in breaks to allow pt. to recover O2 sats >90%.  Pt. tolerated all activities well today able to advance in difficulty and resistance.  Will continue to benefit from further skilled therapy to improve LE strength, balance, and safety.      PT Treatment/Interventions ADLs/Self Care Home Management;Cryotherapy;Electrical Stimulation;Moist Heat;Ultrasound;Neuromuscular re-education;Balance training;Therapeutic exercise;Therapeutic activities;Functional mobility training;Stair training;Gait training;DME Instruction;Patient/family education;Manual techniques;Vasopneumatic Device;Taping;Dry needling;Energy conservation   PT Next Visit Plan Balance, strengthening; Monitor aderence to OTAGO program;      Patient will benefit from skilled therapeutic intervention in order to improve the following deficits and impairments:  Decreased activity tolerance, Decreased balance, Cardiopulmonary status limiting activity, Decreased mobility, Decreased strength, Difficulty walking  Visit Diagnosis: Other abnormalities of gait and mobility  Unsteadiness on feet     Problem List Patient Active Problem  List   Diagnosis Date Noted  . Hip pain 07/27/2014  . Abrasion 07/27/2014  . Insomnia 06/27/2014  . Chronic cholecystitis 06/16/2014  . Coccyx pain 03/30/2014  . Diabetes mellitus type II, controlled ([place]) 03/08/2014  . Depression 03/08/2014  . GERD (gastroesophageal reflux disease) 03/08/2014  . Hyperlipidemia 03/08/2014  . Abdominal pain   . Diverticulitis of large intestine without perforation or abscess without bleeding   . Blood poisoning   . Calculus of gallbladder with acute cholecystitis 02/27/2014  . Acute cholecystitis   . Diverticulitis 02/25/2014  . Urinary retention 02/25/2014  . Hypertension 02/25/2014  . SBO (small bowel obstruction) ([place]) 02/25/2014  . Sepsis ([place]) 02/25/2014  . Cholecystitis 02/25/2014  . Personal history of colonic polyps 01/06/2012  . Special screening for malignant neoplasms, colon 11/15/2011  . Pruritus ani  11/15/2011  . COPD (chronic obstructive pulmonary disease) ([place]) 01/11/2011  . DOE (dyspnea on exertion) 01/01/2011  . Fissure in ano 12/21/2010  . HYPERLIPIDEMIA 01/11/2009  . Obstructive sleep apnea 01/11/2009  . ARTHRITIS, RHEUMATOID 01/11/2009  . OSTEOARTHRITIS 01/11/2009    Bess Harvest, PTA 05/16/16 6:44 PM   [facility] [address]  [place], [place], [address] Phone: [phone]   Fax:  [phone]  Name: CHICK COUSINS MRN: [id number] Date of Birth: July 08, 1929

## 2016-05-19 ENCOUNTER — Other Ambulatory Visit: Payer: Self-pay | Admitting: Medical

## 2016-05-20 ENCOUNTER — Ambulatory Visit: Payer: Medicare Other

## 2016-05-20 DIAGNOSIS — R2689 Other abnormalities of gait and mobility: Secondary | ICD-10-CM

## 2016-05-20 DIAGNOSIS — R2681 Unsteadiness on feet: Secondary | ICD-10-CM

## 2016-05-20 NOTE — Therapy (Signed)
[facility] [address]  [place], [place], [address] Phone: [phone]   Fax:  [phone]  Physical Therapy Treatment  Patient Details  Name: Lance Powell MRN: [id number] Date of Birth: July 22, 1929 Referring Provider: [name] PA-C  Encounter Date: 05/20/2016      PT End of Session - 05/20/16 1453    Visit Number 5   Number of Visits 16   Date for PT Re-Evaluation 07/01/16   Authorization Type Medicare   PT Start Time 1446   PT Stop Time 1527   PT Time Calculation (min) 41 min   Activity Tolerance Patient tolerated treatment well   Behavior During Therapy [facility] for tasks assessed/performed      Past Medical History:  Diagnosis Date  . COPD (chronic obstructive pulmonary disease) ([place])   . Depression 03/08/2014  . Diabetes mellitus   . GERD (gastroesophageal reflux disease) 03/08/2014  . History of blood transfusion   . Hyperlipemia   . Hypertension   . OSA (obstructive sleep apnea)   . Osteoarthritis   . Rheumatoid arthritis(714.0)   . Sleep apnea    cpap    Past Surgical History:  Procedure Laterality Date  . APPENDECTOMY  1969  . CHOLECYSTECTOMY N/A 06/16/2014   Procedure: LAPAROSCOPIC CHOLECYSTECTOMY;  Surgeon: Greer Pickerel, MD;  [facility];  Service: General;  Laterality: N/A;  . CYSTOSCOPY  01/23/2011   Procedure: CYSTOSCOPY;  Surgeon: Molli Hazard, MD;  Location: [facility];  Service: Urology;  Laterality: N/A;  . FLEXIBLE SIGMOIDOSCOPY  01/07/2012   Procedure: FLEXIBLE SIGMOIDOSCOPY;  Surgeon: Ladene Artist, MD,FACG;  [facility];  Service: Endoscopy;  Laterality: N/A;  . HERNIA REPAIR  1990   right and left inguinal  . NISSEN FUNDOPLICATION    . 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 TONSILLECTOMY  1937  . TRANSURETHRAL RESECTION OF PROSTATE  01/23/2011   Procedure: TRANSURETHRAL RESECTION OF THE PROSTATE WITH GYRUS INSTRUMENTS;   Surgeon: Molli Hazard, MD;  Location: [facility];  Service: Urology;  Laterality: N/A;  . VASECTOMY  1972    There were no vitals filed for this visit.      Subjective Assessment - 05/20/16 1451    Subjective Pt. reports he has not performed HEP activities over weekend due to being busy with funeral arrangements for family member.     Patient Stated Goals improve endurance and strength.    Currently in Pain? No/denies   Multiple Pain Sites No                         [facility] Adult PT Treatment/Exercise - 05/20/16 1500      Neuro Re-ed    Neuro Re-ed Details  Alternating forward/lateral toe-clears to 9" stool with light UE support on chair; close supervision from therapist provided; tandem walk next to mat table with close CGA x 3 laps down back; difficulty maintaining balance     Knee/Hip Exercises: Aerobic   Nustep L6 x 5 minutes, L5 1 min  - UE and LE     Knee/Hip Exercises: Machines for Strengthening   Cybex Knee Extension B LE's 25# 2 x 15 reps   Cybex Knee Flexion B LE's 25# 2 x 15 reps      Knee/Hip Exercises: Standing   Knee Flexion AROM;Right;Left;Strengthening;1 set;15 reps   Knee Flexion Limitations 3#    Hip Flexion AROM;Stengthening;Right;Left;1 set;15  reps;Knee straight   Hip Flexion Limitations 3#; at TM   Hip Abduction AROM;Right;Left;1 set;15 reps;Knee straight   Abduction Limitations 3#; at TM    Hip Extension AROM;Right;Left;Stengthening;1 set;15 reps;Knee straight   Extension Limitations 3#; at TM   Functional Squat 15 reps   Functional Squat Limitations at counter   Other Standing Knee Exercises --     Knee/Hip Exercises: Seated   Sit to Sand 10 reps;1 set;with UE support  2 UE pushoff to airex pad                   PT Short Term Goals - 05/13/16 1545      PT SHORT TERM GOAL #1   Title Patient to be independent with initial HEP consisting of balance and strengthening (06/03/16)   Status On-going            PT Long Term Goals - 05/13/16 1545      PT LONG TERM GOAL #1   Title Patient to be independent with advanced HEP (07/01/16)   Status On-going     PT LONG TERM GOAL #2   Title Patient to improve B LE strength to at least 4+/5 (07/01/16)   Status On-going     PT LONG TERM GOAL #3   Title Patient to improve Berg Balance to >/= 52/56 (07/01/16)   Status On-going     PT LONG TERM GOAL #4   Title Patient to report initiation and 3-5 day/week compliance with walking program for improved endurance and functional mobility (07/01/16)   Status On-going     PT LONG TERM GOAL #5   Title Patient to verbalize community resources available to him (07/01/16)   Status On-going               Plan - 05/20/16 1515    Clinical Impression Statement Pt. tolerated progression in standing exercise and introduction to machine strengthening well today.  Periodic sitting rest breaks taken to maintain O2 sats above 90%.  Balance training with toe-clears to step, and tandem walk without support today with close supervision provided.  Pt. still with difficulty performing tandem stance activities.  Has not been performing HEP due to family funeral arrangements however plans to start back consistent performance of HEP.  Need for regular walking in community or townhome discussed with pt. today.  Pt. verbalizing understanding and plans to start short distances soon.   PT Treatment/Interventions ADLs/Self Care Home Management;Cryotherapy;Electrical Stimulation;Moist Heat;Ultrasound;Neuromuscular re-education;Balance training;Therapeutic exercise;Therapeutic activities;Functional mobility training;Stair training;Gait training;DME Instruction;Patient/family education;Manual techniques;Vasopneumatic Device;Taping;Dry needling;Energy conservation   PT Next Visit Plan Monitor if pt. has started regular walking program; balance, strengthening; Monitor aderence to OTAGO program      Patient will benefit from  skilled therapeutic intervention in order to improve the following deficits and impairments:  Decreased activity tolerance, Decreased balance, Cardiopulmonary status limiting activity, Decreased mobility, Decreased strength, Difficulty walking  Visit Diagnosis: Other abnormalities of gait and mobility  Unsteadiness on feet     Problem List Patient Active Problem List   Diagnosis Date Noted  . Hip pain 07/27/2014  . Abrasion 07/27/2014  . Insomnia 06/27/2014  . Chronic cholecystitis 06/16/2014  . Coccyx pain 03/30/2014  . Diabetes mellitus type II, controlled ([place]) 03/08/2014  . Depression 03/08/2014  . GERD (gastroesophageal reflux disease) 03/08/2014  . Hyperlipidemia 03/08/2014  . Abdominal pain   . Diverticulitis of large intestine without perforation or abscess without bleeding   . Blood poisoning   . Calculus of gallbladder with  acute cholecystitis 02/27/2014  . Acute cholecystitis   . Diverticulitis 02/25/2014  . Urinary retention 02/25/2014  . Hypertension 02/25/2014  . SBO (small bowel obstruction) ([place]) 02/25/2014  . Sepsis ([place]) 02/25/2014  . Cholecystitis 02/25/2014  . Personal history of colonic polyps 01/06/2012  . Special screening for malignant neoplasms, colon 11/15/2011  . Pruritus ani 11/15/2011  . COPD (chronic obstructive pulmonary disease) ([place]) 01/11/2011  . DOE (dyspnea on exertion) 01/01/2011  . Fissure in ano 12/21/2010  . HYPERLIPIDEMIA 01/11/2009  . Obstructive sleep apnea 01/11/2009  . ARTHRITIS, RHEUMATOID 01/11/2009  . OSTEOARTHRITIS 01/11/2009    Bess Harvest, PTA 05/20/16 3:52 PM  [facility] [address]  [place], [place], [address] Phone: [phone]   Fax:  [phone]  Name: Lance Powell MRN: [id number] Date of Birth: 1929-05-26

## 2016-05-22 ENCOUNTER — Telehealth: Payer: Self-pay | Admitting: Medical

## 2016-05-22 DIAGNOSIS — L219 Seborrheic dermatitis, unspecified: Secondary | ICD-10-CM | POA: Diagnosis not present

## 2016-05-22 DIAGNOSIS — L821 Other seborrheic keratosis: Secondary | ICD-10-CM | POA: Diagnosis not present

## 2016-05-22 DIAGNOSIS — L570 Actinic keratosis: Secondary | ICD-10-CM | POA: Diagnosis not present

## 2016-05-22 NOTE — Telephone Encounter (Signed)
Pt dropped off document with his results for Glucose for a few wks (from 05-08-2016 to 05-19-2016). Pt wanted PCP to have.

## 2016-05-22 NOTE — Telephone Encounter (Signed)
Document put at front office tray. °

## 2016-05-23 ENCOUNTER — Ambulatory Visit: Payer: Medicare Other | Attending: Medical | Admitting: Physical Therapy

## 2016-05-23 ENCOUNTER — Other Ambulatory Visit: Payer: Self-pay | Admitting: Medical

## 2016-05-23 DIAGNOSIS — R2689 Other abnormalities of gait and mobility: Secondary | ICD-10-CM | POA: Diagnosis present

## 2016-05-23 DIAGNOSIS — R2681 Unsteadiness on feet: Secondary | ICD-10-CM | POA: Diagnosis present

## 2016-05-23 NOTE — Therapy (Signed)
[facility] [address]  [place], [place], [address] Phone: [phone]   Fax:  [phone]  Physical Therapy Treatment  Patient Details  Name: Lance Powell MRN: [id number] Date of Birth: Aug 23, 1929 Referring Provider: [name] PA-C  Encounter Date: 05/23/2016      PT End of Session - 05/23/16 1305    Visit Number 6   Number of Visits 16   Date for PT Re-Evaluation 07/01/16   Authorization Type Medicare   PT Start Time 1059   PT Stop Time 1150   PT Time Calculation (min) 51 min   Activity Tolerance Patient tolerated treatment well   Behavior During Therapy [facility] for tasks assessed/performed      Past Medical History:  Diagnosis Date  . COPD (chronic obstructive pulmonary disease) ([place])   . Depression 03/08/2014  . Diabetes mellitus   . GERD (gastroesophageal reflux disease) 03/08/2014  . History of blood transfusion   . Hyperlipemia   . Hypertension   . OSA (obstructive sleep apnea)   . Osteoarthritis   . Rheumatoid arthritis(714.0)   . Sleep apnea    cpap    Past Surgical History:  Procedure Laterality Date  . APPENDECTOMY  1969  . CHOLECYSTECTOMY N/A 06/16/2014   Procedure: LAPAROSCOPIC CHOLECYSTECTOMY;  Surgeon: Greer Pickerel, MD;  [facility];  Service: General;  Laterality: N/A;  . CYSTOSCOPY  01/23/2011   Procedure: CYSTOSCOPY;  Surgeon: Molli Hazard, MD;  Location: [facility];  Service: Urology;  Laterality: N/A;  . FLEXIBLE SIGMOIDOSCOPY  01/07/2012   Procedure: FLEXIBLE SIGMOIDOSCOPY;  Surgeon: Ladene Artist, MD,FACG;  [facility];  Service: Endoscopy;  Laterality: N/A;  . HERNIA REPAIR  1990   right and left inguinal  . NISSEN FUNDOPLICATION    . 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 TONSILLECTOMY  1937  . TRANSURETHRAL RESECTION OF PROSTATE  01/23/2011   Procedure: TRANSURETHRAL RESECTION OF THE PROSTATE WITH GYRUS INSTRUMENTS;   Surgeon: Molli Hazard, MD;  Location: [facility];  Service: Urology;  Laterality: N/A;  . VASECTOMY  1972    There were no vitals filed for this visit.      Subjective Assessment - 05/23/16 1103    Subjective doing well - blood sugars have been well maintained. Had some muscle soreness after last visit   Pertinent History 2L O2 - continuous   Patient Stated Goals improve endurance and strength.    Currently in Pain? No/denies   Multiple Pain Sites No                         [facility] Adult PT Treatment/Exercise - 05/23/16 0001      Knee/Hip Exercises: Aerobic   Nustep L6 x 6 min - B UE/LE     Knee/Hip Exercises: Standing   Hip Abduction Stengthening;Both;15 reps;Knee straight   Abduction Limitations red tband   Hip Extension Stengthening;Both;15 reps;Knee straight   Extension Limitations red tband   SLS B LE 2 x 30 sec - light UE support; tandem stance 2 x 30 seconds each   Other Standing Knee Exercises toe clears 3# at ankle to 9" stool - 1 set firm surface, 1 set foam   Other Standing Knee Exercises standing quick 90 deg turns to R and L x 15 each direction     Knee/Hip Exercises: Seated   Sit to Sand 10 reps;with UE support  foam - eccentric lowering             Balance Exercises - 05/23/16 1301      Balance Exercises: Standing   Retro Gait --  2 x 30 feet   Cone Rotation Solid surface;Right turn;Left turn  weaving in/out of cones             PT Short Term Goals - 05/13/16 1545      PT SHORT TERM GOAL #1   Title Patient to be independent with initial HEP consisting of balance and strengthening (06/03/16)   Status On-going           PT Long Term Goals - 05/13/16 1545      PT LONG TERM GOAL #1   Title Patient to be independent with advanced HEP (07/01/16)   Status On-going     PT LONG TERM GOAL #2   Title Patient to improve B LE strength to at least 4+/5 (07/01/16)   Status On-going     PT LONG TERM GOAL  #3   Title Patient to improve Berg Balance to >/= 52/56 (07/01/16)   Status On-going     PT LONG TERM GOAL #4   Title Patient to report initiation and 3-5 day/week compliance with walking program for improved endurance and functional mobility (07/01/16)   Status On-going     PT LONG TERM GOAL #5   Title Patient to verbalize community resources available to him (07/01/16)   Status On-going               Plan - 05/23/16 1306    Clinical Impression Statement patient doing well today - reports some difficulty with quick turns in the restrooms - unable to reproduce difficulty today, however did practice this. Doing well with all strengthening and balane progressions otherwise.    PT Treatment/Interventions ADLs/Self Care Home Management;Cryotherapy;Electrical Stimulation;Moist Heat;Ultrasound;Neuromuscular re-education;Balance training;Therapeutic exercise;Therapeutic activities;Functional mobility training;Stair training;Gait training;DME Instruction;Patient/family education;Manual techniques;Vasopneumatic Device;Taping;Dry needling;Energy conservation   PT Next Visit Plan Monitor if pt. has started regular walking program; balance, strengthening; Monitor aderence to OTAGO program   Consulted and Agree with Plan of Care Patient      Patient will benefit from skilled therapeutic intervention in order to improve the following deficits and impairments:  Decreased activity tolerance, Decreased balance, Cardiopulmonary status limiting activity, Decreased mobility, Decreased strength, Difficulty walking  Visit Diagnosis: Other abnormalities of gait and mobility  Unsteadiness on feet     Problem List Patient Active Problem List   Diagnosis Date Noted  . Hip pain 07/27/2014  . Abrasion 07/27/2014  . Insomnia 06/27/2014  . Chronic cholecystitis 06/16/2014  . Coccyx pain 03/30/2014  . Diabetes mellitus type II, controlled ([place]) 03/08/2014  . Depression 03/08/2014  . GERD  (gastroesophageal reflux disease) 03/08/2014  . Hyperlipidemia 03/08/2014  . Abdominal pain   . Diverticulitis of large intestine without perforation or abscess without bleeding   . Blood poisoning   . Calculus of gallbladder with acute cholecystitis 02/27/2014  . Acute cholecystitis   . Diverticulitis 02/25/2014  . Urinary retention 02/25/2014  . Hypertension 02/25/2014  . SBO (small bowel obstruction) ([place]) 02/25/2014  . Sepsis ([place]) 02/25/2014  . Cholecystitis 02/25/2014  . Personal history of colonic polyps 01/06/2012  . Special screening for malignant neoplasms, colon 11/15/2011  . Pruritus ani 11/15/2011  . COPD (chronic obstructive pulmonary disease) ([place]) 01/11/2011  . DOE (dyspnea on exertion) 01/01/2011  . Fissure in ano 12/21/2010  . HYPERLIPIDEMIA 01/11/2009  . Obstructive sleep  apnea 01/11/2009  . ARTHRITIS, RHEUMATOID 01/11/2009  . OSTEOARTHRITIS 01/11/2009     Lanney Gins, PT, DPT 05/23/16 1:07 PM   [facility] [address]  [place], [place], [address] Phone: [phone]   Fax:  [phone]  Name: Lance Powell MRN: [id number] Date of Birth: Jan 31, 1929

## 2016-05-23 NOTE — Telephone Encounter (Signed)
I reviewed all of patients sugars and consider these in acceptable range. Super strict control in light of his age not necessary. Im ok with number if less than 150 most of time. Do not want to see any numbers less than 70 as well.   Thank him for sending in numbers.  Will place bs readings for scanning.

## 2016-05-24 ENCOUNTER — Encounter: Payer: Medicare Other | Attending: Endocrinology | Admitting: Dietician

## 2016-05-24 ENCOUNTER — Encounter: Payer: Self-pay | Admitting: Dietician

## 2016-05-24 DIAGNOSIS — E119 Type 2 diabetes mellitus without complications: Secondary | ICD-10-CM | POA: Insufficient documentation

## 2016-05-24 DIAGNOSIS — E118 Type 2 diabetes mellitus with unspecified complications: Secondary | ICD-10-CM

## 2016-05-24 DIAGNOSIS — Z713 Dietary counseling and surveillance: Secondary | ICD-10-CM | POA: Diagnosis not present

## 2016-05-24 DIAGNOSIS — IMO0002 Reserved for concepts with insufficient information to code with codable children: Secondary | ICD-10-CM

## 2016-05-24 DIAGNOSIS — E1165 Type 2 diabetes mellitus with hyperglycemia: Secondary | ICD-10-CM

## 2016-05-24 MED FILL — INVOKANA 100 MG TABLET: 100 | 30 days supply | Qty: 30 | Fill #0

## 2016-05-24 NOTE — Progress Notes (Signed)
Diabetes Self-Management Education  Visit Type: First/Initial  Appt. Start Time: 1500 Appt. End Time: 1700 Patient arrived late due to being unable to find our building.  05/24/2016  [name], identified by name and date of birth, is a 83 y.o. male with a diagnosis of Diabetes: Type 2. Other hx includes Hyperlipidemia, HTN, GERD, OSA- uses C-pap, COPD on chronic oxygen.   Medications include Metformin and Invokana.  He reports improvement in his blood sugar since beginning the Invokana.    Patient lives alone since the death of his wife 2 years ago.  His daughters cook for him or bring him meals from a restaurant.  They also help him with other tasks weekly.  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  ASSESSMENT  Height 6' (1.829 m), weight 242 lb (109.8 kg). Body mass index is 32.82 kg/m.  He reports losing from 265 or 270 lbs in the past 4 months by being more careful of his food choices.  He reports that he is no longer eating sweets and infrequent snacks.      Diabetes Self-Management Education - 05/24/16 1514      Visit Information   Visit Type First/Initial     Initial Visit   Diabetes Type Type 2   Are you currently following a meal plan? No   Are you taking your medications as prescribed? Yes   Date Diagnosed 2013     Health Coping   How would you rate your overall health? Good     Psychosocial Assessment   Patient Belief/Attitude about Diabetes Motivated to manage diabetes   Self-care barriers None   Self-management support Doctor's office;Family   Other persons present Patient   Patient Concerns Nutrition/Meal planning;Glycemic Control   Special Needs None   Preferred Learning Style No preference indicated   Learning Readiness Ready   How often do you need to have someone help you when you read instructions, pamphlets, or other written materials from your doctor or pharmacy? 1 - Never   What is the last grade level you completed in school? 3 years college      Pre-Education Assessment   Patient understands the diabetes disease and treatment process. Demonstrates understanding / competency   Patient understands incorporating nutritional management into lifestyle. Needs Review   Patient undertands incorporating physical activity into lifestyle. Demonstrates understanding / competency   Patient understands using medications safely. Demonstrates understanding / competency   Patient understands monitoring blood glucose, interpreting and using results Needs Review   Patient understands prevention, detection, and treatment of acute complications. Demonstrates understanding / competency   Patient understands prevention, detection, and treatment of chronic complications. Needs Review   Patient understands how to develop strategies to address psychosocial issues. Needs Review   Patient understands how to develop strategies to promote health/change behavior. Needs Review     Complications   Last HgB A1C per patient/outside source 10.7 %  04/18/16   How often do you check your blood sugar? 1-2 times/day   Fasting Blood glucose range (mg/dL) 130-179   Postprandial Blood glucose range (mg/dL) 130-179   Number of hypoglycemic episodes per month 0   Number of hyperglycemic episodes per week 0   Have you had a dilated eye exam in the past 12 months? Yes   Have you had a dental exam in the past 12 months? No   Are you checking your feet? Yes   How many days per week are you checking your feet? 2  Dietary Intake   Breakfast 2 eggs and 4 stips bacon OR cereal and skim milk  8-9   Snack (morning) none   Lunch PB&J on [employer] or rye bread, banana or 1/2 can fruit   Snack (afternoon) none   Dinner [employer] (steak, mashed potatoes, gravy on the side, mushrooms or Green beans OR BLT from [employer] or [place] dip from [employer] (evening) banana   Beverage(s) water, coffee with 1 1/2 cream, sweet tea, occasional regular gatorade      Exercise   Exercise Type Light (walking / raking leaves)  PT at [facility] 2xper week, uses gym equipment 3 times per week.   How many days per week to you exercise? 5   How many minutes per day do you exercise? 30   Total minutes per week of exercise 150     Patient Education   Previous Diabetes Education No   Disease state  Definition of diabetes, type 1 and 2, and the diagnosis of diabetes   Nutrition management  Role of diet in the treatment of diabetes and the relationship between the three main macronutrients and blood glucose level;Food label reading, portion sizes and measuring food.;Meal options for control of blood glucose level and chronic complications.;Information on hints to eating out and maintain blood glucose control.   Physical activity and exercise  Role of exercise on diabetes management, blood pressure control and cardiac health.   Monitoring Purpose and frequency of SMBG.;Identified appropriate SMBG and/or A1C goals.;Daily foot exams   Chronic complications Dental care;Retinopathy and reason for yearly dilated eye exams   Psychosocial adjustment Worked with patient to identify barriers to care and solutions;Role of stress on diabetes;Identified and addressed patients feelings and concerns about diabetes   Personal strategies to promote health Lifestyle issues that need to be addressed for better diabetes care     Individualized Goals (developed by patient)   Nutrition General guidelines for healthy choices and portions discussed   Physical Activity Exercise 3-5 times per week;30 minutes per day   Medications take my medication as prescribed   Monitoring  test my blood glucose as discussed   Reducing Risk do foot checks daily   Health Coping discuss diabetes with (comment)  MD, RD, family     Post-Education Assessment   Patient understands the diabetes disease and treatment process. Demonstrates understanding / competency   Patient understands incorporating  nutritional management into lifestyle. Demonstrates understanding / competency   Patient undertands incorporating physical activity into lifestyle. Demonstrates understanding / competency   Patient understands using medications safely. Demonstrates understanding / competency   Patient understands monitoring blood glucose, interpreting and using results Demonstrates understanding / competency   Patient understands prevention, detection, and treatment of acute complications. Demonstrates understanding / competency   Patient understands prevention, detection, and treatment of chronic complications. Demonstrates understanding / competency   Patient understands how to develop strategies to address psychosocial issues. Demonstrates understanding / competency   Patient understands how to develop strategies to promote health/change behavior. Demonstrates understanding / competency     Outcomes   Expected Outcomes Other (comment)  Some interest in learning but question if he will change   Future DMSE PRN   Program Status Completed      Individualized Plan for Diabetes Self-Management Training:   Learning Objective:  Patient will have a greater understanding of diabetes self-management. Patient education plan is to attend individual and/or group sessions per assessed needs and concerns.   Plan:  Patient Instructions  Rethink what you drink.   Choose beverages without sugar.  G2 Gatorade would be a better choice, unsweetened tea. Continue to be active most days of the week. Choose fresh fruit more than canned.  Canned fruit serving size is 1/2 cup.  Choose canned fruit without added sugar. Choose baked, broiled, or grilled rather than fried. Consider limiting added fat. LUVO frozen dinners (healthier option). Consider increasing your vegetable intake.  Read the dining out tips.       Expected Outcomes:  Other (comment) (Some interest in learning but question if he will  change)  Education material provided: Living Well with Diabetes, Food label handouts, A1C conversion sheet, Meal plan card, My Plate and Snack sheet, Building a balanced meal, Healthy eating when dining out, Sample of sugar free Carnation Breakfast at his request.  If problems or questions, patient to contact team via:  Phone  Future DSME appointment: PRN

## 2016-05-24 NOTE — Telephone Encounter (Signed)
Notified pt that blood sugar readings are acceptable per PCP and PCP wanst numbers to stay below 150 most of time and not under 70. Pt voiced understanding.

## 2016-05-24 NOTE — Patient Instructions (Addendum)
Rethink what you drink.   Choose beverages without sugar.  G2 Gatorade would be a better choice, unsweetened tea. Continue to be active most days of the week. Choose fresh fruit more than canned.  Canned fruit serving size is 1/2 cup.  Choose canned fruit without added sugar. Choose baked, broiled, or grilled rather than fried. Consider limiting added fat. LUVO frozen dinners (healthier option). Consider increasing your vegetable intake.  Read the dining out tips.

## 2016-05-28 ENCOUNTER — Ambulatory Visit: Payer: Medicare Other

## 2016-05-28 DIAGNOSIS — R2689 Other abnormalities of gait and mobility: Secondary | ICD-10-CM

## 2016-05-28 DIAGNOSIS — R2681 Unsteadiness on feet: Secondary | ICD-10-CM

## 2016-05-28 NOTE — Therapy (Signed)
[facility] [address]  [place], [place], [address] Phone: [phone]   Fax:  [phone]  Physical Therapy Treatment  Patient Details  Name: Lance Powell MRN: [id number] Date of Birth: 1929-02-04 Referring Provider: [name] PA-C  Encounter Date: 05/28/2016      PT End of Session - 05/28/16 1613    Visit Number 7   Number of Visits 16   Date for PT Re-Evaluation 07/01/16   Authorization Type Medicare   PT Start Time 1610   PT Stop Time 1655   PT Time Calculation (min) 45 min   Activity Tolerance Patient tolerated treatment well   Behavior During Therapy [facility] for tasks assessed/performed      Past Medical History:  Diagnosis Date  . COPD (chronic obstructive pulmonary disease) ([place])   . Depression 03/08/2014  . Diabetes mellitus   . GERD (gastroesophageal reflux disease) 03/08/2014  . History of blood transfusion   . Hyperlipemia   . Hypertension   . OSA (obstructive sleep apnea)   . Osteoarthritis   . Rheumatoid arthritis(714.0)   . Sleep apnea    cpap    Past Surgical History:  Procedure Laterality Date  . APPENDECTOMY  1969  . CHOLECYSTECTOMY N/A 06/16/2014   Procedure: LAPAROSCOPIC CHOLECYSTECTOMY;  Surgeon: Greer Pickerel, MD;  Location: [facility];  Service: General;  Laterality: N/A;  . CYSTOSCOPY  01/23/2011   Procedure: CYSTOSCOPY;  Surgeon: Molli Hazard, MD;  Location: [facility];  Service: Urology;  Laterality: N/A;  . FLEXIBLE SIGMOIDOSCOPY  01/07/2012   Procedure: FLEXIBLE SIGMOIDOSCOPY;  Surgeon: Ladene Artist, MD,FACG;  Location: [facility];  Service: Endoscopy;  Laterality: N/A;  . HERNIA REPAIR  1990   right and left inguinal  . NISSEN FUNDOPLICATION    . 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 TONSILLECTOMY  1937  . TRANSURETHRAL RESECTION OF PROSTATE  01/23/2011   Procedure: TRANSURETHRAL RESECTION OF THE PROSTATE WITH GYRUS INSTRUMENTS;   Surgeon: Molli Hazard, MD;  Location: [facility];  Service: Urology;  Laterality: N/A;  . VASECTOMY  1972    There were no vitals filed for this visit.      Subjective Assessment - 05/28/16 1612    Subjective Pt. doing well today.     Pertinent History 2L O2 - continuous   Patient Stated Goals improve endurance and strength.    Currently in Pain? No/denies   Multiple Pain Sites No                         [facility] Adult PT Treatment/Exercise - 05/28/16 1625      Self-Care   Self-Care Other Self-Care Comments   Other Self-Care Comments  Instruction on proper use of gym equipment with ways to determine appropriate weight and fatigue level; discussion of typical parameters of strength training to educate pt. on use of local gym in neighborhood     Lumbar Exercises: Machines for Strengthening   Other Lumbar Machine Exercise BATCA low row 20# 3 x 10 reps (low handles    Other Lumbar Machine Exercise BATCA pulldown machine 20# 3 x 15 reps   Cybex Knee Extension B LE's 25# 3 x 10 reps   Cybex Knee Flexion B LE's 25# 3 x 10 reps      Knee/Hip Exercises: Aerobic   Nustep L6 x 5 min, 1 min L5 - B UE/LE  Knee/Hip Exercises: Machines for Strengthening   Other Machine Cable single arm row at chest height 10# 2 x 15 reps each side; cues for scapular squeeze                 PT Education - 05/28/16 1800    Education provided Yes   Education Details Gym Program Including: reps, sets, and rest times for machine strengthening, knee extension, knee flexion, lat pulldowns, rows, shoulder IR/ER with cable    Person(s) Educated Patient   Methods Explanation;Demonstration;Tactile cues;Verbal cues;Handout   Comprehension Verbalized understanding;Returned demonstration;Verbal cues required;Tactile cues required;Need further instruction          PT Short Term Goals - 05/13/16 1545      PT SHORT TERM GOAL #1   Title Patient to be independent with  initial HEP consisting of balance and strengthening (06/03/16)   Status On-going           PT Long Term Goals - 05/13/16 1545      PT LONG TERM GOAL #1   Title Patient to be independent with advanced HEP (07/01/16)   Status On-going     PT LONG TERM GOAL #2   Title Patient to improve B LE strength to at least 4+/5 (07/01/16)   Status On-going     PT LONG TERM GOAL #3   Title Patient to improve Berg Balance to >/= 52/56 (07/01/16)   Status On-going     PT LONG TERM GOAL #4   Title Patient to report initiation and 3-5 day/week compliance with walking program for improved endurance and functional mobility (07/01/16)   Status On-going     PT LONG TERM GOAL #5   Title Patient to verbalize community resources available to him (07/01/16)   Status On-going               Plan - 05/28/16 1614    Clinical Impression Statement [name] doing well today.  Treatment focusing on creation and demonstration of machine strengthening gym program for pt. to perform at local gym.  Heavy cues for proper technique today with machine strengthening activity however, pt. verbalizing and demonstrating good form by end of treatment.  Handout issued to pt. for gym program with instruction to attempt exercises before next treatment.       PT Treatment/Interventions ADLs/Self Care Home Management;Cryotherapy;Electrical Stimulation;Moist Heat;Ultrasound;Neuromuscular re-education;Balance training;Therapeutic exercise;Therapeutic activities;Functional mobility training;Stair training;Gait training;DME Instruction;Patient/family education;Manual techniques;Vasopneumatic Device;Taping;Dry needling;Energy conservation   PT Next Visit Plan Monitor tolerance to gym program; Monitor walking program; balance, strengthening; Monitor aderence to OTAGO program      Patient will benefit from skilled therapeutic intervention in order to improve the following deficits and impairments:  Decreased activity tolerance,  Decreased balance, Cardiopulmonary status limiting activity, Decreased mobility, Decreased strength, Difficulty walking  Visit Diagnosis: Other abnormalities of gait and mobility  Unsteadiness on feet     Problem List Patient Active Problem List   Diagnosis Date Noted  . Hip pain 07/27/2014  . Abrasion 07/27/2014  . Insomnia 06/27/2014  . Chronic cholecystitis 06/16/2014  . Coccyx pain 03/30/2014  . Diabetes mellitus type II, controlled ([place]) 03/08/2014  . Depression 03/08/2014  . GERD (gastroesophageal reflux disease) 03/08/2014  . Hyperlipidemia 03/08/2014  . Abdominal pain   . Diverticulitis of large intestine without perforation or abscess without bleeding   . Blood poisoning   . Calculus of gallbladder with acute cholecystitis 02/27/2014  . Acute cholecystitis   . Diverticulitis 02/25/2014  . Urinary retention 02/25/2014  .  Hypertension 02/25/2014  . SBO (small bowel obstruction) ([place]) 02/25/2014  . Sepsis ([place]) 02/25/2014  . Cholecystitis 02/25/2014  . Personal history of colonic polyps 01/06/2012  . Special screening for malignant neoplasms, colon 11/15/2011  . Pruritus ani 11/15/2011  . COPD (chronic obstructive pulmonary disease) ([place]) 01/11/2011  . DOE (dyspnea on exertion) 01/01/2011  . Fissure in ano 12/21/2010  . HYPERLIPIDEMIA 01/11/2009  . Obstructive sleep apnea 01/11/2009  . ARTHRITIS, RHEUMATOID 01/11/2009  . OSTEOARTHRITIS 01/11/2009    Bess Harvest, PTA 05/28/16 6:15 PM  [facility] [address]  [place], [place], [address] Phone: [phone]   Fax:  [phone]  Name: Lance Powell MRN: [id number] Date of Birth: Jul 05, 1929

## 2016-05-30 ENCOUNTER — Ambulatory Visit: Payer: Medicare Other

## 2016-05-30 DIAGNOSIS — R2689 Other abnormalities of gait and mobility: Secondary | ICD-10-CM | POA: Diagnosis not present

## 2016-05-30 DIAGNOSIS — R2681 Unsteadiness on feet: Secondary | ICD-10-CM

## 2016-05-30 DIAGNOSIS — J449 Chronic obstructive pulmonary disease, unspecified: Secondary | ICD-10-CM | POA: Diagnosis not present

## 2016-05-30 DIAGNOSIS — G4733 Obstructive sleep apnea (adult) (pediatric): Secondary | ICD-10-CM | POA: Diagnosis not present

## 2016-05-30 NOTE — Therapy (Signed)
[facility] [address]  [place], [place], [address] Phone: [phone]   Fax:  [phone]  Physical Therapy Treatment  Patient Details  Name: Lance Powell MRN: [id number] Date of Birth: Feb 19, 1929 Referring Provider: [name] PA-C  Encounter Date: 05/30/2016      PT End of Session - 05/30/16 1325    Visit Number 8   Number of Visits 16   Date for PT Re-Evaluation 07/01/16   Authorization Type Medicare   PT Start Time 1316   PT Stop Time 1402   PT Time Calculation (min) 46 min   Activity Tolerance Patient tolerated treatment well   Behavior During Therapy [facility] for tasks assessed/performed      Past Medical History:  Diagnosis Date  . COPD (chronic obstructive pulmonary disease) ([place])   . Depression 03/08/2014  . Diabetes mellitus   . GERD (gastroesophageal reflux disease) 03/08/2014  . History of blood transfusion   . Hyperlipemia   . Hypertension   . OSA (obstructive sleep apnea)   . Osteoarthritis   . Rheumatoid arthritis(714.0)   . Sleep apnea    cpap    Past Surgical History:  Procedure Laterality Date  . APPENDECTOMY  1969  . CHOLECYSTECTOMY N/A 06/16/2014   Procedure: LAPAROSCOPIC CHOLECYSTECTOMY;  Surgeon: Greer Pickerel, MD;  Location: [facility];  Service: General;  Laterality: N/A;  . CYSTOSCOPY  01/23/2011   Procedure: CYSTOSCOPY;  Surgeon: Molli Hazard, MD;  Location: [facility];  Service: Urology;  Laterality: N/A;  . FLEXIBLE SIGMOIDOSCOPY  01/07/2012   Procedure: FLEXIBLE SIGMOIDOSCOPY;  Surgeon: Ladene Artist, MD,FACG;  [facility];  Service: Endoscopy;  Laterality: N/A;  . HERNIA REPAIR  1990   right and left inguinal  . NISSEN FUNDOPLICATION    . 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 TONSILLECTOMY  1937  . TRANSURETHRAL RESECTION OF PROSTATE  01/23/2011   Procedure: TRANSURETHRAL RESECTION OF THE PROSTATE WITH GYRUS INSTRUMENTS;   Surgeon: Molli Hazard, MD;  Location: [facility];  Service: Urology;  Laterality: N/A;  . VASECTOMY  1972    There were no vitals filed for this visit.      Subjective Assessment - 05/30/16 1324    Subjective Pt. doing well today.  Hasn't attempted gym program workout however plans to try this over weekend on Saturday.     Pertinent History 2L O2 - continuous   Patient Stated Goals improve endurance and strength.    Currently in Pain? No/denies   Multiple Pain Sites No                         [facility] Adult PT Treatment/Exercise - 05/30/16 1329      Neuro Re-ed    Neuro Re-ed Details  Alternating cross-over cone toe-touch while standing on airex pad at counter x 10 reps each side; occasional UE support on counter and CGA from therapist; B tandem stance 2 x 20 sec each way at counter; B SLS 2 x 20 sec each side at counter     Knee/Hip Exercises: Standing   Heel Raises 20 reps;Both   Heel Raises Limitations at counter    Knee Flexion Right;Left;Strengthening;1 set;15 reps   Knee Flexion Limitations 4#   Hip Flexion Stengthening;Right;Left;1 set;Knee bent;20 reps   Hip Flexion Limitations 3#; at counter    Hip Abduction Stengthening;Knee straight;20 reps;Right;Left   Abduction Limitations 3#; at  counter   Hip Extension Stengthening;Knee straight;20 reps;Right;Left   Extension Limitations 3#; at counter    Functional Squat 15 reps   Functional Squat Limitations at counter     Knee/Hip Exercises: Seated   Sit to Sand with UE support;2 sets  x 12 reps; feet on airex pad; pushoff from knees                   PT Short Term Goals - 05/13/16 1545      PT SHORT TERM GOAL #1   Title Patient to be independent with initial HEP consisting of balance and strengthening (06/03/16)   Status On-going           PT Long Term Goals - 05/13/16 1545      PT LONG TERM GOAL #1   Title Patient to be independent with advanced HEP (07/01/16)    Status On-going     PT LONG TERM GOAL #2   Title Patient to improve B LE strength to at least 4+/5 (07/01/16)   Status On-going     PT LONG TERM GOAL #3   Title Patient to improve Berg Balance to >/= 52/56 (07/01/16)   Status On-going     PT LONG TERM GOAL #4   Title Patient to report initiation and 3-5 day/week compliance with walking program for improved endurance and functional mobility (07/01/16)   Status On-going     PT LONG TERM GOAL #5   Title Patient to verbalize community resources available to him (07/01/16)   Status On-going               Plan - 05/30/16 1338    Clinical Impression Statement Pt. doing well today and plans to attempt gym exercise program on Saturday over weekend.  Pt. performed well with all strengthening and balance activity today able to perform with increased repetitions and resistance while maintaining ~ 90 % O2 sats.  Still requiring rest breaks throughout standing therex due to shortness of breath.  Pt. admitting today he is not performing OTAGO balance program however is performing original HEP frequently.  Will continue to progress strengthening and balance activity per pt. tolerance in coming visits.    PT Treatment/Interventions ADLs/Self Care Home Management;Cryotherapy;Electrical Stimulation;Moist Heat;Ultrasound;Neuromuscular re-education;Balance training;Therapeutic exercise;Therapeutic activities;Functional mobility training;Stair training;Gait training;DME Instruction;Patient/family education;Manual techniques;Vasopneumatic Device;Taping;Dry needling;Energy conservation   PT Next Visit Plan Monitor tolerance to gym program; Monitor walking program; balance, strengthening; Monitor aderence to OTAGO program      Patient will benefit from skilled therapeutic intervention in order to improve the following deficits and impairments:  Decreased activity tolerance, Decreased balance, Cardiopulmonary status limiting activity, Decreased mobility,  Decreased strength, Difficulty walking  Visit Diagnosis: Other abnormalities of gait and mobility  Unsteadiness on feet     Problem List Patient Active Problem List   Diagnosis Date Noted  . Hip pain 07/27/2014  . Abrasion 07/27/2014  . Insomnia 06/27/2014  . Chronic cholecystitis 06/16/2014  . Coccyx pain 03/30/2014  . Diabetes mellitus type II, controlled ([place]) 03/08/2014  . Depression 03/08/2014  . GERD (gastroesophageal reflux disease) 03/08/2014  . Hyperlipidemia 03/08/2014  . Abdominal pain   . Diverticulitis of large intestine without perforation or abscess without bleeding   . Blood poisoning   . Calculus of gallbladder with acute cholecystitis 02/27/2014  . Acute cholecystitis   . Diverticulitis 02/25/2014  . Urinary retention 02/25/2014  . Hypertension 02/25/2014  . SBO (small bowel obstruction) ([place]) 02/25/2014  . Sepsis ([place]) 02/25/2014  . Cholecystitis  02/25/2014  . Personal history of colonic polyps 01/06/2012  . Special screening for malignant neoplasms, colon 11/15/2011  . Pruritus ani 11/15/2011  . COPD (chronic obstructive pulmonary disease) ([place]) 01/11/2011  . DOE (dyspnea on exertion) 01/01/2011  . Fissure in ano 12/21/2010  . HYPERLIPIDEMIA 01/11/2009  . Obstructive sleep apnea 01/11/2009  . ARTHRITIS, RHEUMATOID 01/11/2009  . OSTEOARTHRITIS 01/11/2009    Bess Harvest, PTA 05/30/16 3:19 PM  [facility] [address]  [place], [place], [address] Phone: [phone]   Fax:  [phone]  Name: Lance Powell MRN: [id number] Date of Birth: 01-16-1930

## 2016-06-04 ENCOUNTER — Ambulatory Visit: Payer: Medicare Other | Admitting: Physical Therapy

## 2016-06-04 DIAGNOSIS — R2681 Unsteadiness on feet: Secondary | ICD-10-CM

## 2016-06-04 DIAGNOSIS — R2689 Other abnormalities of gait and mobility: Secondary | ICD-10-CM | POA: Diagnosis not present

## 2016-06-04 NOTE — Therapy (Signed)
[facility] [address]  [place], [place], [address] Phone: [phone]   Fax:  [phone]  Physical Therapy Treatment  Patient Details  Name: Lance Powell MRN: [id number] Date of Birth: 01-18-1930 Referring Provider: [name] PA-C  Encounter Date: 06/04/2016      PT End of Session - 06/04/16 1626    Visit Number 9   Number of Visits 16   Date for PT Re-Evaluation 07/01/16   Authorization Type Medicare   PT Start Time 0277   PT Stop Time 1703   PT Time Calculation (min) 42 min   Activity Tolerance Patient tolerated treatment well   Behavior During Therapy [facility] for tasks assessed/performed      Past Medical History:  Diagnosis Date  . COPD (chronic obstructive pulmonary disease) ([place])   . Depression 03/08/2014  . Diabetes mellitus   . GERD (gastroesophageal reflux disease) 03/08/2014  . History of blood transfusion   . Hyperlipemia   . Hypertension   . OSA (obstructive sleep apnea)   . Osteoarthritis   . Rheumatoid arthritis(714.0)   . Sleep apnea    cpap    Past Surgical History:  Procedure Laterality Date  . APPENDECTOMY  1969  . CHOLECYSTECTOMY N/A 06/16/2014   Procedure: LAPAROSCOPIC CHOLECYSTECTOMY;  Surgeon: Greer Pickerel, MD;  Location: [facility];  Service: General;  Laterality: N/A;  . CYSTOSCOPY  01/23/2011   Procedure: CYSTOSCOPY;  Surgeon: Molli Hazard, MD;  Location: [facility];  Service: Urology;  Laterality: N/A;  . FLEXIBLE SIGMOIDOSCOPY  01/07/2012   Procedure: FLEXIBLE SIGMOIDOSCOPY;  Surgeon: Ladene Artist, MD,FACG;  Location: [facility];  Service: Endoscopy;  Laterality: N/A;  . HERNIA REPAIR  1990   right and left inguinal  . NISSEN FUNDOPLICATION    . 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 TONSILLECTOMY  1937  . TRANSURETHRAL RESECTION OF PROSTATE  01/23/2011   Procedure: TRANSURETHRAL RESECTION OF THE PROSTATE WITH GYRUS INSTRUMENTS;   Surgeon: Molli Hazard, MD;  Location: [facility];  Service: Urology;  Laterality: N/A;  . VASECTOMY  1972    There were no vitals filed for this visit.      Subjective Assessment - 06/04/16 1624    Subjective Went to [employer] - went well - is going to start taking a paper/pencil to list weights   Pertinent History 2L O2 - continuous   Patient Stated Goals improve endurance and strength.    Currently in Pain? No/denies   Multiple Pain Sites No                         [facility] Adult PT Treatment/Exercise - 06/04/16 1627      Lumbar Exercises: Machines for Strengthening   Other Lumbar Machine Exercise BATCA row - narrow grip 10 x 35#     Knee/Hip Exercises: Aerobic   Nustep L6 x 6 minutes     Knee/Hip Exercises: Machines for Strengthening   Cybex Knee Extension B LE 35# 2 x 15    Cybex Knee Flexion B LE 35# 2 x 15     Knee/Hip Exercises: Standing   Forward Step Up Right;Left;10 reps;Hand Hold: 2;Step Height: 8"   Forward Step Up Limitations light UE support from PT     Knee/Hip Exercises: Seated   Sit to Sand 10 reps;2 sets;with UE support  standing on AirEx  PT Short Term Goals - 05/13/16 1545      PT SHORT TERM GOAL #1   Title Patient to be independent with initial HEP consisting of balance and strengthening (06/03/16)   Status On-going           PT Long Term Goals - 05/13/16 1545      PT LONG TERM GOAL #1   Title Patient to be independent with advanced HEP (07/01/16)   Status On-going     PT LONG TERM GOAL #2   Title Patient to improve B LE strength to at least 4+/5 (07/01/16)   Status On-going     PT LONG TERM GOAL #3   Title Patient to improve Berg Balance to >/= 52/56 (07/01/16)   Status On-going     PT LONG TERM GOAL #4   Title Patient to report initiation and 3-5 day/week compliance with walking program for improved endurance and functional mobility (07/01/16)   Status On-going      PT LONG TERM GOAL #5   Title Patient to verbalize community resources available to him (07/01/16)   Status On-going               Plan - 06/04/16 1627    Clinical Impression Statement Patient reports he has been going to community gym with good success. Able to progress weight and reps today - does require increased recovery time with this to ensure good O2 sat levels. Appropriate muscle soreness and fatigue with all activities with breathing primary limiting factor.    PT Treatment/Interventions ADLs/Self Care Home Management;Cryotherapy;Electrical Stimulation;Moist Heat;Ultrasound;Neuromuscular re-education;Balance training;Therapeutic exercise;Therapeutic activities;Functional mobility training;Stair training;Gait training;DME Instruction;Patient/family education;Manual techniques;Vasopneumatic Device;Taping;Dry needling;Energy conservation   PT Next Visit Plan Monitor tolerance to gym program; Monitor walking program; balance, strengthening; Monitor aderence to OTAGO program   Consulted and Agree with Plan of Care Patient      Patient will benefit from skilled therapeutic intervention in order to improve the following deficits and impairments:  Decreased activity tolerance, Decreased balance, Cardiopulmonary status limiting activity, Decreased mobility, Decreased strength, Difficulty walking  Visit Diagnosis: Other abnormalities of gait and mobility  Unsteadiness on feet     Problem List Patient Active Problem List   Diagnosis Date Noted  . Hip pain 07/27/2014  . Abrasion 07/27/2014  . Insomnia 06/27/2014  . Chronic cholecystitis 06/16/2014  . Coccyx pain 03/30/2014  . Diabetes mellitus type II, controlled ([place]) 03/08/2014  . Depression 03/08/2014  . GERD (gastroesophageal reflux disease) 03/08/2014  . Hyperlipidemia 03/08/2014  . Abdominal pain   . Diverticulitis of large intestine without perforation or abscess without bleeding   . Blood poisoning   . Calculus of  gallbladder with acute cholecystitis 02/27/2014  . Acute cholecystitis   . Diverticulitis 02/25/2014  . Urinary retention 02/25/2014  . Hypertension 02/25/2014  . SBO (small bowel obstruction) ([place]) 02/25/2014  . Sepsis ([place]) 02/25/2014  . Cholecystitis 02/25/2014  . Personal history of colonic polyps 01/06/2012  . Special screening for malignant neoplasms, colon 11/15/2011  . Pruritus ani 11/15/2011  . COPD (chronic obstructive pulmonary disease) ([place]) 01/11/2011  . DOE (dyspnea on exertion) 01/01/2011  . Fissure in ano 12/21/2010  . HYPERLIPIDEMIA 01/11/2009  . Obstructive sleep apnea 01/11/2009  . ARTHRITIS, RHEUMATOID 01/11/2009  . OSTEOARTHRITIS 01/11/2009      Lanney Gins, PT, DPT 06/04/16 5:41 PM   [facility] [address]  [place], [place], [address] Phone: [phone]   Fax:  [phone]  Name: Lance Powell MRN: [id number] Date of Birth: 1929-12-29

## 2016-06-06 ENCOUNTER — Ambulatory Visit: Payer: Medicare Other

## 2016-06-06 ENCOUNTER — Encounter (INDEPENDENT_AMBULATORY_CARE_PROVIDER_SITE_OTHER): Payer: Medicare Other | Admitting: Family Medicine

## 2016-06-06 DIAGNOSIS — R2681 Unsteadiness on feet: Secondary | ICD-10-CM

## 2016-06-06 DIAGNOSIS — R2689 Other abnormalities of gait and mobility: Secondary | ICD-10-CM

## 2016-06-06 NOTE — Therapy (Addendum)
[facility] [address]  [place], [place], [address] Phone: [phone]   Fax:  [phone]  Physical Therapy Treatment  Patient Details  Name: Lance Powell MRN: [id number] Date of Birth: Apr 03, 1929 Referring Provider: [name] PA-C  Encounter Date: 06/06/2016      PT End of Session - 06/06/16 1320    Visit Number 10   Number of Visits 16   Date for PT Re-Evaluation 07/01/16   Authorization Type Medicare   PT Start Time 1224   PT Stop Time 1357   PT Time Calculation (min) 40 min   Activity Tolerance Patient tolerated treatment well   Behavior During Therapy [facility] for tasks assessed/performed      Past Medical History:  Diagnosis Date  . COPD (chronic obstructive pulmonary disease) ([place])   . Depression 03/08/2014  . Diabetes mellitus   . GERD (gastroesophageal reflux disease) 03/08/2014  . History of blood transfusion   . Hyperlipemia   . Hypertension   . OSA (obstructive sleep apnea)   . Osteoarthritis   . Rheumatoid arthritis(714.0)   . Sleep apnea    cpap    Past Surgical History:  Procedure Laterality Date  . APPENDECTOMY  1969  . CHOLECYSTECTOMY N/A 06/16/2014   Procedure: LAPAROSCOPIC CHOLECYSTECTOMY;  Surgeon: Greer Pickerel, MD;  Location: [facility];  Service: General;  Laterality: N/A;  . CYSTOSCOPY  01/23/2011   Procedure: CYSTOSCOPY;  Surgeon: Molli Hazard, MD;  Location: [facility];  Service: Urology;  Laterality: N/A;  . FLEXIBLE SIGMOIDOSCOPY  01/07/2012   Procedure: FLEXIBLE SIGMOIDOSCOPY;  Surgeon: Ladene Artist, MD,FACG;  [facility];  Service: Endoscopy;  Laterality: N/A;  . HERNIA REPAIR  1990   right and left inguinal  . NISSEN FUNDOPLICATION    . 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 TONSILLECTOMY  1937  . TRANSURETHRAL RESECTION OF PROSTATE  01/23/2011   Procedure: TRANSURETHRAL RESECTION OF THE PROSTATE WITH GYRUS INSTRUMENTS;   Surgeon: Molli Hazard, MD;  Location: [facility];  Service: Urology;  Laterality: N/A;  . VASECTOMY  1972    There were no vitals filed for this visit.      Subjective Assessment - 06/06/16 1320    Subjective pt. doing well.  No issues with gym program.   Patient Stated Goals improve endurance and strength.    Currently in Pain? No/denies   Multiple Pain Sites No            [facility] PT Assessment - 06/06/16 1342      Observation/Other Assessments   Focus on Therapeutic Outcomes (FOTO)  45% (55% limitation)     Strength   Strength Assessment Site Hip;Knee;Ankle   Right/Left Hip Right;Left   Right Hip Flexion 4/5   Left Hip Flexion 4/5   Right/Left Knee Right;Left   Right Knee Flexion 4+/5   Right Knee Extension 4+/5   Left Knee Flexion 4+/5   Left Knee Extension 4+/5   Right/Left Ankle Right;Left   Right Ankle Dorsiflexion 4+/5   Left Ankle Dorsiflexion 4+/5     Berg Balance Test   Sit to Stand Able to stand without using hands and stabilize independently   Standing Unsupported Able to stand safely 2 minutes   Sitting with Back Unsupported but Feet Supported on Floor or Stool Able to sit safely and securely 2 minutes   Stand to Sit Sits safely with minimal use of hands  Transfers Able to transfer safely, minor use of hands   Standing Unsupported with Eyes Closed Able to stand 10 seconds safely   Standing Ubsupported with Feet Together Able to place feet together independently and stand 1 minute safely   From Standing, Reach Forward with Outstretched Arm Can reach confidently >25 cm (10")   From Standing Position, Pick up Object from Floor Able to pick up shoe safely and easily   From Standing Position, Turn to Look Behind Over each Shoulder Looks behind from both sides and weight shifts well   Turn 360 Degrees Able to turn 360 degrees safely in 4 seconds or less   Standing Unsupported, Alternately Place Feet on Step/Stool Able to stand  independently and safely and complete 8 steps in 20 seconds   Standing Unsupported, One Foot in [place] to place foot tandem independently and hold 30 seconds   Standing on One Leg Able to lift leg independently and hold equal to or more than 3 seconds   Total Score 54                     [facility] Adult PT Treatment/Exercise - 06/06/16 1530      Lumbar Exercises: Machines for Strengthening   Other Lumbar Machine Exercise BATCA row - narrow grip 15 reps x 35#   Other Lumbar Machine Exercise BATCA pulldown machine 25# x 15 reps     Knee/Hip Exercises: Aerobic   Nustep L6 x 6 minutes     Knee/Hip Exercises: Standing   SLS B LE 2 x 30 sec - light UE support; tandem stance 2 x 30 seconds each                  PT Short Term Goals - 06/06/16 1354      PT SHORT TERM GOAL #1   Title Patient to be independent with initial HEP consisting of balance and strengthening (06/03/16)   Status Achieved           PT Long Term Goals - 06/06/16 1326      PT LONG TERM GOAL #1   Title Patient to be independent with advanced HEP (07/01/16)   Status On-going     PT LONG TERM GOAL #2   Title Patient to improve B LE strength to at least 4+/5 (07/01/16)   Status Partially Met     PT LONG TERM GOAL #3   Title Patient to improve Berg Balance to >/= 52/56 (07/01/16)   Status Achieved  5.17.18: 54/56 BERG balance      PT LONG TERM GOAL #4   Title Patient to report initiation and 3-5 day/week compliance with walking program for improved endurance and functional mobility (07/01/16)   Status On-going  5.17.18: not attempted      PT LONG TERM GOAL #5   Title Patient to verbalize community resources available to him (07/01/16)   Status On-going               Plan - 06/06/16 1321    Clinical Impression Statement Pt. showing good progress with therapy thus far improving BERG balance score to 54/56 today and some strength improvement with testing.  Pt. has not yet attempted  walking program however is currently using local gym with machine LE strengthening focus without issue.  Some difficulty still with single leg stance activities.  Pt. will continue to benefit from further skilled therapy to maximize LE function and use of community resources.  PT Treatment/Interventions ADLs/Self Care Home Management;Cryotherapy;Electrical Stimulation;Moist Heat;Ultrasound;Neuromuscular re-education;Balance training;Therapeutic exercise;Therapeutic activities;Functional mobility training;Stair training;Gait training;DME Instruction;Patient/family education;Manual techniques;Vasopneumatic Device;Taping;Dry needling;Energy conservation   PT Next Visit Plan Balance with single leg stance activities; Monitor tolerance to gym program; Monitor walking program; balance, strengthening; Monitor aderence to OTAGO program      Patient will benefit from skilled therapeutic intervention in order to improve the following deficits and impairments:  Decreased activity tolerance, Decreased balance, Cardiopulmonary status limiting activity, Decreased mobility, Decreased strength, Difficulty walking  Visit Diagnosis: Other abnormalities of gait and mobility  Unsteadiness on feet       G-Codes - June 26, 2016 1523    Functional Assessment Tool Used (Outpatient Only) FOTO: 45% (55% limitation)    Functional Limitation Mobility: Walking and moving around   Mobility: Walking and Moving Around Current Status ([id number]) At least 40 percent but less than 60 percent impaired, limited or restricted   Mobility: Walking and Moving Around Goal Status [phone]) At least 40 percent but less than 60 percent impaired, limited or restricted      Problem List Patient Active Problem List   Diagnosis Date Noted  . Hip pain 07/27/2014  . Abrasion 07/27/2014  . Insomnia 06/27/2014  . Chronic cholecystitis 06/16/2014  . Coccyx pain 03/30/2014  . Diabetes mellitus type II, controlled ([place]) 03/08/2014  . Depression  03/08/2014  . GERD (gastroesophageal reflux disease) 03/08/2014  . Hyperlipidemia 03/08/2014  . Abdominal pain   . Diverticulitis of large intestine without perforation or abscess without bleeding   . Blood poisoning   . Calculus of gallbladder with acute cholecystitis 02/27/2014  . Acute cholecystitis   . Diverticulitis 02/25/2014  . Urinary retention 02/25/2014  . Hypertension 02/25/2014  . SBO (small bowel obstruction) ([place]) 02/25/2014  . Sepsis ([place]) 02/25/2014  . Cholecystitis 02/25/2014  . Personal history of colonic polyps 01/06/2012  . Special screening for malignant neoplasms, colon 11/15/2011  . Pruritus ani 11/15/2011  . COPD (chronic obstructive pulmonary disease) ([place]) 01/11/2011  . DOE (dyspnea on exertion) 01/01/2011  . Fissure in ano 12/21/2010  . HYPERLIPIDEMIA 01/11/2009  . Obstructive sleep apnea 01/11/2009  . ARTHRITIS, RHEUMATOID 01/11/2009  . OSTEOARTHRITIS 01/11/2009    Bess Harvest, PTA June 26, 2016 3:44 PM  [facility] [address]  [place], [place], [address] Phone: [phone]   Fax:  [phone]  Name: GABINO HAGIN MRN: [id number] Date of Birth: [birth year]

## 2016-06-11 ENCOUNTER — Ambulatory Visit: Payer: Medicare Other | Admitting: Physical Therapy

## 2016-06-12 ENCOUNTER — Telehealth: Payer: Self-pay | Admitting: Medical

## 2016-06-12 MED ORDER — MECLIZINE HCL 12.5 MG PO TABS: 12.5000 mg | ORAL_TABLET | Freq: Three times a day (TID) | ORAL | 0 refills | Status: DC | PRN

## 2016-06-12 NOTE — Telephone Encounter (Signed)
Rx meclizine sent to pharmacy. Refer to prior message as well.

## 2016-06-12 NOTE — Telephone Encounter (Signed)
Pt wants rx for dizziness and he declined office visit offer. So I need to know or he needs to make sure dizziness not related to low sugar or high sugars. Any sugars less than 70 or over 300. Also we need to make sure no gross motor or sensory function deficits.   If answers to those are negative can take meclizine.

## 2016-06-12 NOTE — Telephone Encounter (Signed)
Relation to OY:WVXU Call back [place]:  Reason for call:  Patient requesting Rx for vertigo, declined appointment at this time.

## 2016-06-13 ENCOUNTER — Ambulatory Visit: Payer: Medicare Other

## 2016-06-13 ENCOUNTER — Other Ambulatory Visit: Payer: Self-pay | Admitting: Medical

## 2016-06-13 VITALS — BP 126/78

## 2016-06-13 DIAGNOSIS — R2681 Unsteadiness on feet: Secondary | ICD-10-CM

## 2016-06-13 DIAGNOSIS — R2689 Other abnormalities of gait and mobility: Secondary | ICD-10-CM | POA: Diagnosis not present

## 2016-06-13 NOTE — Therapy (Signed)
[facility] [address]  [place], [place], [address] Phone: [phone]   Fax:  [phone]  Physical Therapy Treatment  Patient Details  Name: Lance Powell MRN: [id number] Date of Birth: 07-13-1929 Referring Provider: [name] PA-C  Encounter Date: 06/13/2016      PT End of Session - 06/13/16 1329    Visit Number 11   Number of Visits 16   Date for PT Re-Evaluation 07/01/16   Authorization Type Medicare   PT Start Time 5038   PT Stop Time 1402   PT Time Calculation (min) 45 min   Activity Tolerance Patient tolerated treatment well   Behavior During Therapy [facility] for tasks assessed/performed      Past Medical History:  Diagnosis Date  . COPD (chronic obstructive pulmonary disease) ([place])   . Depression 03/08/2014  . Diabetes mellitus   . GERD (gastroesophageal reflux disease) 03/08/2014  . History of blood transfusion   . Hyperlipemia   . Hypertension   . OSA (obstructive sleep apnea)   . Osteoarthritis   . Rheumatoid arthritis(714.0)   . Sleep apnea    cpap    Past Surgical History:  Procedure Laterality Date  . APPENDECTOMY  1969  . CHOLECYSTECTOMY N/A 06/16/2014   Procedure: LAPAROSCOPIC CHOLECYSTECTOMY;  Surgeon: Greer Pickerel, MD;  [facility];  Service: General;  Laterality: N/A;  . CYSTOSCOPY  01/23/2011   Procedure: CYSTOSCOPY;  Surgeon: Molli Hazard, MD;  [facility];  Service: Urology;  Laterality: N/A;  . FLEXIBLE SIGMOIDOSCOPY  01/07/2012   Procedure: FLEXIBLE SIGMOIDOSCOPY;  Surgeon: Ladene Artist, MD,FACG;  [facility];  Service: Endoscopy;  Laterality: N/A;  . HERNIA REPAIR  1990   right and left inguinal  . NISSEN FUNDOPLICATION    . 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 TONSILLECTOMY  1937  . TRANSURETHRAL RESECTION OF PROSTATE  01/23/2011   Procedure: TRANSURETHRAL RESECTION OF THE PROSTATE WITH GYRUS INSTRUMENTS;   Surgeon: Molli Hazard, MD;  [facility];  Service: Urology;  Laterality: N/A;  . VASECTOMY  1972    Vitals:   06/13/16 1804  BP: 126/78        Subjective Assessment - 06/13/16 1326    Subjective Pt. noting he fell backwards onto buttocks on Tuesday while trying to put leash on dog, stating "I passed out when I bent over".  Pt. denies hitting head with fall.  Pt. notes he looked up his symptoms online and feels he has vertigo.  Pt. called MD yesterday and requested medications for vertigo however was unable to visit MD in person.     Pertinent History 2L O2 - continuous   Patient Stated Goals improve endurance and strength.    Currently in Pain? No/denies   Multiple Pain Sites No                         [facility] Adult PT Treatment/Exercise - 06/13/16 1340      Ambulation/Gait   Ambulation/Gait Yes   Ambulation/Gait Assistance 6: Modified independent (Device/Increase time)   Ambulation Distance (Feet) 400 Feet   Assistive device None   Gait Pattern Step-through pattern;Wide base of support   Gait Comments 1 sitting rest break taken due to O2 sats <90 %     Knee/Hip Exercises: Aerobic   Nustep L4 x 6 minutes  lower resistance upon pt. request  Knee/Hip Exercises: Standing   Hip Flexion Stengthening;Right;Left;1 set;Knee bent;20 reps   Hip Flexion Limitations 4#; at counter    Hip Abduction Stengthening;Knee straight;20 reps;Right;Left   Abduction Limitations 4#; at counter   Hip Extension Stengthening;Knee straight;20 reps;Right;Left   Extension Limitations 4#; at counter    SLS B LE 3 x 30 sec; light UE support      Knee/Hip Exercises: Seated   Long Arc Quad Strengthening;Both;1 set;15 reps;Weights   Long Arc Quad Weight 4 lbs.   Long Arc Quad Limitations with adduction ball squeeze    Ball Squeeze 5" x 20 reps with ball                 PT Education - 06/13/16 1800    Education provided Yes   Education  Details Educational handout provided to pt. on community resources and group exercise programs for post-d/c    Person(s) Educated Patient   Methods Explanation;Verbal cues;Handout   Comprehension Verbalized understanding;Verbal cues required;Need further instruction          PT Short Term Goals - 06/06/16 1354      PT SHORT TERM GOAL #1   Title Patient to be independent with initial HEP consisting of balance and strengthening (06/03/16)   Status Achieved           PT Long Term Goals - 06/06/16 1326      PT LONG TERM GOAL #1   Title Patient to be independent with advanced HEP (07/01/16)   Status On-going     PT LONG TERM GOAL #2   Title Patient to improve B LE strength to at least 4+/5 (07/01/16)   Status Partially Met     PT LONG TERM GOAL #3   Title Patient to improve Berg Balance to >/= 52/56 (07/01/16)   Status Achieved  5.17.18: 54/56 BERG balance      PT LONG TERM GOAL #4   Title Patient to report initiation and 3-5 day/week compliance with walking program for improved endurance and functional mobility (07/01/16)   Status On-going  5.17.18: not attempted      PT LONG TERM GOAL #5   Title Patient to verbalize community resources available to him (07/01/16)   Status On-going               Plan - 06/13/16 1802    Clinical Impression Statement Pt. noting he fell backwards onto buttocks landing on carpet inside house on Tuesday while trying to put leash on dog, stating "I passed out when I bent over".  Pt. denies hitting head with fall.  Pt. notes "I looked up my symptoms online and I have vertigo."  Pt. called MD yesterday and requested medications for vertigo however was unable to visit MD in person.  Pt. educated today on possible causes of dizziness and syncope include low blood sugar and poor hydration.  Was unable to reproduce dizziness or lightheadedness in treatment with activities and BP WFL to start treatment.  Treatment with more conservative therex per pt.  request today.  Pt. educated on available community resources today to promote continued activity post-d/c.  Pt. verbalized understanding and plans to bring in further questions regarding this in coming treatments.    PT Treatment/Interventions ADLs/Self Care Home Management;Cryotherapy;Electrical Stimulation;Moist Heat;Ultrasound;Neuromuscular re-education;Balance training;Therapeutic exercise;Therapeutic activities;Functional mobility training;Stair training;Gait training;DME Instruction;Patient/family education;Manual techniques;Vasopneumatic Device;Taping;Dry needling;Energy conservation   PT Next Visit Plan f/u with pt. regarding community resourcse; Balance with single leg stance activities; Monitor tolerance to gym program; Monitor walking  program; balance, strengthening; Monitor aderence to OTAGO program      Patient will benefit from skilled therapeutic intervention in order to improve the following deficits and impairments:  Decreased activity tolerance, Decreased balance, Cardiopulmonary status limiting activity, Decreased mobility, Decreased strength, Difficulty walking  Visit Diagnosis: Other abnormalities of gait and mobility  Unsteadiness on feet     Problem List Patient Active Problem List   Diagnosis Date Noted  . Hip pain 07/27/2014  . Abrasion 07/27/2014  . Insomnia 06/27/2014  . Chronic cholecystitis 06/16/2014  . Coccyx pain 03/30/2014  . Diabetes mellitus type II, controlled ([place]) 03/08/2014  . Depression 03/08/2014  . GERD (gastroesophageal reflux disease) 03/08/2014  . Hyperlipidemia 03/08/2014  . Abdominal pain   . Diverticulitis of large intestine without perforation or abscess without bleeding   . Blood poisoning   . Calculus of gallbladder with acute cholecystitis 02/27/2014  . Acute cholecystitis   . Diverticulitis 02/25/2014  . Urinary retention 02/25/2014  . Hypertension 02/25/2014  . SBO (small bowel obstruction) ([place]) 02/25/2014  . Sepsis ([place])  02/25/2014  . Cholecystitis 02/25/2014  . Personal history of colonic polyps 01/06/2012  . Special screening for malignant neoplasms, colon 11/15/2011  . Pruritus ani 11/15/2011  . COPD (chronic obstructive pulmonary disease) ([place]) 01/11/2011  . DOE (dyspnea on exertion) 01/01/2011  . Fissure in ano 12/21/2010  . HYPERLIPIDEMIA 01/11/2009  . Obstructive sleep apnea 01/11/2009  . ARTHRITIS, RHEUMATOID 01/11/2009  . OSTEOARTHRITIS 01/11/2009    Bess Harvest, PTA 06/13/16 6:17 PM  [facility] [address]  [place], [place], [address] Phone: [phone]   Fax:  [phone]  Name: Lance Powell MRN: [id number] Date of Birth: 1929/05/28

## 2016-06-13 NOTE — Patient Instructions (Signed)
.  comm

## 2016-06-14 ENCOUNTER — Ambulatory Visit (INDEPENDENT_AMBULATORY_CARE_PROVIDER_SITE_OTHER): Payer: Medicare Other | Admitting: Medical

## 2016-06-14 ENCOUNTER — Encounter: Payer: Self-pay | Admitting: Medical

## 2016-06-14 VITALS — BP 100/60 | HR 116 | Temp 97.8°F | Ht 72.0 in | Wt 242.6 lb

## 2016-06-14 DIAGNOSIS — E119 Type 2 diabetes mellitus without complications: Secondary | ICD-10-CM

## 2016-06-14 DIAGNOSIS — R42 Dizziness and giddiness: Secondary | ICD-10-CM

## 2016-06-14 DIAGNOSIS — W57XXXA Bitten or stung by nonvenomous insect and other nonvenomous arthropods, initial encounter: Secondary | ICD-10-CM

## 2016-06-14 DIAGNOSIS — L989 Disorder of the skin and subcutaneous tissue, unspecified: Secondary | ICD-10-CM

## 2016-06-14 MED ORDER — DOXYCYCLINE HYCLATE 100 MG PO TABS
100.0000 mg | ORAL_TABLET | Freq: Two times a day (BID) | ORAL | 0 refills | Status: DC
Start: 2016-06-14 — End: 2016-09-19

## 2016-06-14 NOTE — Progress Notes (Addendum)
Subjective:    Patient ID: Lance Powell, male    DOB: 1929-12-20, 83 y.o.   MRN: [id number]  HPI  Pt in states his dizziness which  had yesterday went away with meclizine last night. Has not returned. He reported when he bent over the other day room spinned briefly. He thinks he has crystals in his ears. He want to see PT for head maneuvers. No gross motor or sensory function deficits.  Pt 2 days ago had tick bite. One on forearm left side. Another one on his great toe left foot. On review no fever, no chills, no sweats, no joint pain, no rash or fatigue.   Pt has seen dermatologist and rt upper ext skin lesions treated.  Has history diabetes. Consistently sugar levels now  125-135 about 90% of the time. Occasonial higher 170. Low 97. On invokana and metformin.      Review of Systems  Constitutional: Negative for chills, fatigue and fever.  HENT: Negative for congestion and dental problem.   Respiratory: Negative for cough, chest tightness and wheezing.   Cardiovascular: Negative for chest pain and palpitations.  Gastrointestinal: Negative for abdominal pain, constipation, diarrhea and nausea.  Musculoskeletal: Negative for back pain.  Skin:       See hpi. Tick bites.  Neurological: Positive for dizziness. Negative for tremors, seizures, syncope, facial asymmetry, speech difficulty, weakness and numbness.  Psychiatric/Behavioral: Negative for agitation, confusion and dysphoric mood. The patient is not nervous/anxious.    Past Medical History:  Diagnosis Date  . COPD (chronic obstructive pulmonary disease) ([place])   . Depression 03/08/2014  . Diabetes mellitus   . GERD (gastroesophageal reflux disease) 03/08/2014  . History of blood transfusion   . Hyperlipemia   . Hypertension   . OSA (obstructive sleep apnea)   . Osteoarthritis   . Rheumatoid arthritis(714.0)   . Sleep apnea    cpap     Social History   Social History  . Marital status: Married    Spouse name: N/A   . Number of children: 6  . Years of education: N/A   Occupational History  . Retired     Chief Financial Officer   Social History Main Topics  . Smoking status: Former Smoker    Packs/day: 4.00    Years: 30.00    Types: Cigarettes    Quit date: 01/21/1978  . Smokeless tobacco: Never Used  . Alcohol use No  . Drug use: No  . Sexual activity: Not on file   Other Topics Concern  . Not on file   Social History Narrative  . No narrative on file    Past Surgical History:  Procedure Laterality Date  . APPENDECTOMY  1969  . CHOLECYSTECTOMY N/A 06/16/2014   Procedure: LAPAROSCOPIC CHOLECYSTECTOMY;  Surgeon: Greer Pickerel, MD;  [facility];  Service: General;  Laterality: N/A;  . CYSTOSCOPY  01/23/2011   Procedure: CYSTOSCOPY;  Surgeon: Molli Hazard, MD;  Location: [facility];  Service: Urology;  Laterality: N/A;  . FLEXIBLE SIGMOIDOSCOPY  01/07/2012   Procedure: FLEXIBLE SIGMOIDOSCOPY;  Surgeon: Ladene Artist, MD,FACG;  [facility];  Service: Endoscopy;  Laterality: N/A;  . HERNIA REPAIR  1990   right and left inguinal  . NISSEN FUNDOPLICATION    . 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 TONSILLECTOMY  1937  . TRANSURETHRAL RESECTION OF PROSTATE  01/23/2011   Procedure: TRANSURETHRAL RESECTION OF THE PROSTATE WITH GYRUS INSTRUMENTS;  Surgeon: Molli Hazard, MD;  Location: [place];  Service: Urology;  Laterality: N/A;  . VASECTOMY  1972    Family History  Problem Relation Age of Onset  . Heart disease Mother   . Emphysema Brother   . Cancer Daughter        breast    No Known Allergies  Current Outpatient Prescriptions on File Prior to Visit  Medication Sig Dispense Refill  . albuterol (PROVENTIL HFA;VENTOLIN HFA) 108 (90 Base) MCG/ACT inhaler Inhale 2 puffs into the lungs every 6 (six) hours as needed for wheezing or shortness of breath. 1 Inhaler 2  . aspirin 81 MG tablet Take 1 tablet (81 mg total) by mouth every  morning. Stop for 1 week and then resume (Patient taking differently: Take 81 mg by mouth every morning. )    . atorvastatin (LIPITOR) 40 MG tablet TAKE ONE TABLET BY MOUTH ONCE DAILY IN THE MORNING 90 tablet 0  . celecoxib (CELEBREX) 200 MG capsule TAKE ONE CAPSULE BY MOUTH ONCE DAILY IN THE EVENING 30 capsule 1  . cholecalciferol (VITAMIN D) 1000 UNITS tablet Take 2,000 Units by mouth 2 (two) times daily.     . clonazePAM (KLONOPIN) 0.25 MG disintegrating tablet 1 tab po q hs prn insomnia or anxiety 10 tablet 2  . enalapril (VASOTEC) 2.5 MG tablet Take 2.5 mg by mouth every morning. Reported on 07/12/2015    . glucose blood (BAYER CONTOUR TEST) test strip Use as instructed to check blood sugar twice a day.  DX  E11.9 100 each 5  . Indacaterol Maleate (ARCAPTA NEOHALER) 75 MCG CAPS Place 1 capsule into inhaler and inhale every morning. 90 capsule 1  . INVOKANA 100 MG TABS tablet TAKE 1 TABLET (100 MG TOTAL) BY MOUTH DAILY BEFORE BREAKFAST. 30 tablet 3  . lidocaine (LIDODERM) 5 % Place 1 patch onto the skin daily. Remove & Discard patch within 12 hours or as directed by MD 30 patch 0  . meclizine (ANTIVERT) 12.5 MG tablet Take 1 tablet (12.5 mg total) by mouth 3 (three) times daily as needed for dizziness. 30 tablet 0  . metFORMIN (GLUCOPHAGE) 500 MG tablet TAKE 1 TABLET BY MOUTH THREE TIMES DAILY 90 tablet 0  . misoprostol (CYTOTEC) 100 MCG tablet TAKE ONE TABLET BY MOUTH ONCE DAILY IN THE MORNING, ONE TABLET ONCE DAILY IN THE EVENING, AND TWO TABLETS ONCE DAILY AT BEDTIME 120 tablet 2  . Multiple Vitamins-Minerals (CENTRUM) tablet Take 1 tablet by mouth every morning. Reported on 01/05/2015    . OVER THE COUNTER MEDICATION Place 1-2 drops into both eyes daily as needed (dry red eyes.). Reported on 01/05/2015    . Tiotropium Bromide-Olodaterol (STIOLTO RESPIMAT) 2.5-2.5 MCG/ACT AERS Inhale 2 puffs into the lungs daily. 4 g 5  . Tiotropium Bromide-Olodaterol (STIOLTO RESPIMAT) 2.5-2.5 MCG/ACT AERS  Inhale 2 puffs into the lungs daily. 4 g 2  . tobramycin (TOBREX) 0.3 % ophthalmic solution Place 2 drops into the left eye every 6 (six) hours. 5 mL 0  . UNABLE TO FIND CPAP    . venlafaxine XR (EFFEXOR-XR) 37.5 MG 24 hr capsule TAKE 1 CAPSULE BY MOUTH ONCE DAILY WITH BREAKFAST 30 capsule 0  . vitamin C (ASCORBIC ACID) 500 MG tablet Take 500 mg by mouth every morning.      No current facility-administered medications on file prior to visit.     BP 100/60 (BP Location: Left Arm, Patient Position: Sitting, Cuff Size: Normal)   Pulse (!) 116   Temp 97.8 F (  36.6 C) (Oral)   Ht 6' (1.829 m)   Wt 242 lb 9.6 oz (110 kg)   SpO2 95% Comment: Pt  on 2 liters of 02  BMI 32.90 kg/m       Objective:   Physical Exam  General Mental Status- Alert. General Appearance- Not in acute distress.   Skin General: Color- Normal Color. Moisture- Normal Moisture. No obvious tick bite marks on his left forearm or on his left great toe.  Neck Carotid Arteries- Normal color. Moisture- Normal Moisture. No carotid bruits. No JVD.  Chest and Lung Exam Auscultation: Breath Sounds:-Normal.  Cardiovascular Auscultation:Rythm- Regular. Murmurs & Other Heart Sounds:Auscultation of the heart reveals- No Murmurs.  Abdomen Inspection:-Inspeection Normal. Palpation/Percussion:Note:No mass. Palpation and Percussion of the abdomen reveal- Non Tender, Non Distended + BS, no rebound or guarding.    Neurologic Cranial Nerve exam:- CN III-XII intact(No nystagmus), symmetric smile.  Strength:- 5/5 equal and symmetric strength both upper and lower extremities.  Rt forearm- prior area of lesion look good. He has had area cryofrreeze. The raised kerotosis type lesion look flat.  Feet- see quality metrics.      Assessment & Plan:  Your dizziness has improved. You don't have vertigo presently but report history of some recently so will refer to PT for educate you on  head maneuvers. Can continue meclizine if  needed.  For tick bite studies will put in future labs to be done in 2 weeks. Will see if tick bite antibody studies +. But if over next 2 weeks you get tick bite symptoms as explained then start doxycycline.  Will follow skin on visits and if changes refer back to derm  Continue current diabetic treatments and keep up good up good diet.  Follow up in 3 months or as needed  Datha Kissinger, [name], [employer]

## 2016-06-14 NOTE — Telephone Encounter (Signed)
Called patient at home number. No answer.  Unable to leave a message.

## 2016-06-14 NOTE — Patient Instructions (Addendum)
Your dizziness has improved. You don't have vertigo presently but report history of some recently so will refer to PT for educate you on  head maneuvers. Can continue meclizine if needed.  For tick bite studies will put in future labs to be done in 2 weeks. Will see if tick bite antibody studies +. But if over next 2 weeks you get tick bite symptoms as explained then start doxycycline.  Will follow skin on visits and if changes refer back to derm  Continue current diabetic treatments and keep up good up good diet.  Follow up in 3 months or as needed

## 2016-06-18 ENCOUNTER — Ambulatory Visit: Payer: Medicare Other | Admitting: Physical Therapy

## 2016-06-18 DIAGNOSIS — R2681 Unsteadiness on feet: Secondary | ICD-10-CM

## 2016-06-18 DIAGNOSIS — R2689 Other abnormalities of gait and mobility: Secondary | ICD-10-CM | POA: Diagnosis not present

## 2016-06-18 NOTE — Therapy (Signed)
[facility] [address]  [place], [place], [address] Phone: [phone]   Fax:  [phone]  Physical Therapy Treatment  Patient Details  Name: Lance Powell MRN: [id number] Date of Birth: 1929-05-28 Referring Provider: [name] PA-C  Encounter Date: 06/18/2016      PT End of Session - 06/18/16 1559    Visit Number 12   Number of Visits 16   Date for PT Re-Evaluation 07/01/16   Authorization Type Medicare   PT Start Time 1558   PT Stop Time 1640   PT Time Calculation (min) 42 min   Activity Tolerance Patient tolerated treatment well   Behavior During Therapy [facility] for tasks assessed/performed      Past Medical History:  Diagnosis Date  . COPD (chronic obstructive pulmonary disease) ([place])   . Depression 03/08/2014  . Diabetes mellitus   . GERD (gastroesophageal reflux disease) 03/08/2014  . History of blood transfusion   . Hyperlipemia   . Hypertension   . OSA (obstructive sleep apnea)   . Osteoarthritis   . Rheumatoid arthritis(714.0)   . Sleep apnea    cpap    Past Surgical History:  Procedure Laterality Date  . APPENDECTOMY  1969  . CHOLECYSTECTOMY N/A 06/16/2014   Procedure: LAPAROSCOPIC CHOLECYSTECTOMY;  Surgeon: Greer Pickerel, MD;  [facility];  Service: General;  Laterality: N/A;  . CYSTOSCOPY  01/23/2011   Procedure: CYSTOSCOPY;  Surgeon: Molli Hazard, MD;  Location: [facility];  Service: Urology;  Laterality: N/A;  . FLEXIBLE SIGMOIDOSCOPY  01/07/2012   Procedure: FLEXIBLE SIGMOIDOSCOPY;  Surgeon: Ladene Artist, MD,FACG;  [facility];  Service: Endoscopy;  Laterality: N/A;  . HERNIA REPAIR  1990   right and left inguinal  . NISSEN FUNDOPLICATION    . 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 TONSILLECTOMY  1937  . TRANSURETHRAL RESECTION OF PROSTATE  01/23/2011   Procedure: TRANSURETHRAL RESECTION OF THE PROSTATE WITH GYRUS INSTRUMENTS;   Surgeon: Molli Hazard, MD;  Location[facility];  Service: Urology;  Laterality: N/A;  . VASECTOMY  1972    There were no vitals filed for this visit.      Subjective Assessment - 06/18/16 1559    Subjective No longer dizzy - feeling well today   Pertinent History 2L O2 - continuous   Patient Stated Goals improve endurance and strength.    Currently in Pain? No/denies   Multiple Pain Sites No                         [facility] Adult PT Treatment/Exercise - 06/18/16 1601      Neuro Re-ed    Neuro Re-ed Details  side stepping on AirEx - light UE support and SUP - 3 laps; stnading toe clears to 9" step standing on AirEx x 15 each LE; knocking down and sitting up cones with SL x 6 each LE     Lumbar Exercises: Machines for Strengthening   Other Lumbar Machine Exercise BATCA row - narrow grip 15 reps x 35# - 3 second hold     Knee/Hip Exercises: Aerobic   Nustep L5 x 6 minutes     Knee/Hip Exercises: Machines for Strengthening   Cybex Knee Extension B LE 35# 2 x 10   Cybex Knee Flexion B LE 35# 2 x 10     Knee/Hip Exercises: Seated   Sit to Sand 15  reps;with UE support  standing on AirEx                  PT Short Term Goals - 06/06/16 1354      PT SHORT TERM GOAL #1   Title Patient to be independent with initial HEP consisting of balance and strengthening (06/03/16)   Status Achieved           PT Long Term Goals - 06/06/16 1326      PT LONG TERM GOAL #1   Title Patient to be independent with advanced HEP (07/01/16)   Status On-going     PT LONG TERM GOAL #2   Title Patient to improve B LE strength to at least 4+/5 (07/01/16)   Status Partially Met     PT LONG TERM GOAL #3   Title Patient to improve Berg Balance to >/= 52/56 (07/01/16)   Status Achieved  5.17.18: 54/56 BERG balance      PT LONG TERM GOAL #4   Title Patient to report initiation and 3-5 day/week compliance with walking program for improved endurance  and functional mobility (07/01/16)   Status On-going  5.17.18: not attempted      PT LONG TERM GOAL #5   Title Patient to verbalize community resources available to him (07/01/16)   Status On-going               Plan - 06/18/16 1600    Clinical Impression Statement Patient reporting dizziness has completey resolved with no new episodes. Patient doing well wtih strength and balance training wtih balance seemingly imporved since start of PT. Does require frequent rest breaks to control breathing with education to ensure breathing in through nose/out of mouth with good carryover   PT Treatment/Interventions ADLs/Self Care Home Management;Cryotherapy;Electrical Stimulation;Moist Heat;Ultrasound;Neuromuscular re-education;Balance training;Therapeutic exercise;Therapeutic activities;Functional mobility training;Stair training;Gait training;DME Instruction;Patient/family education;Manual techniques;Vasopneumatic Device;Taping;Dry needling;Energy conservation   PT Next Visit Plan f/u with pt. regarding community resourcse; Balance with single leg stance activities; Monitor tolerance to gym program; Monitor walking program; balance, strengthening; Monitor aderence to OTAGO program   Consulted and Agree with Plan of Care Patient      Patient will benefit from skilled therapeutic intervention in order to improve the following deficits and impairments:  Decreased activity tolerance, Decreased balance, Cardiopulmonary status limiting activity, Decreased mobility, Decreased strength, Difficulty walking  Visit Diagnosis: Other abnormalities of gait and mobility  Unsteadiness on feet     Problem List Patient Active Problem List   Diagnosis Date Noted  . Hip pain 07/27/2014  . Abrasion 07/27/2014  . Insomnia 06/27/2014  . Chronic cholecystitis 06/16/2014  . Coccyx pain 03/30/2014  . Diabetes mellitus type II, controlled ([place]) 03/08/2014  . Depression 03/08/2014  . GERD (gastroesophageal  reflux disease) 03/08/2014  . Hyperlipidemia 03/08/2014  . Abdominal pain   . Diverticulitis of large intestine without perforation or abscess without bleeding   . Blood poisoning   . Calculus of gallbladder with acute cholecystitis 02/27/2014  . Acute cholecystitis   . Diverticulitis 02/25/2014  . Urinary retention 02/25/2014  . Hypertension 02/25/2014  . SBO (small bowel obstruction) ([place]) 02/25/2014  . Sepsis ([place]) 02/25/2014  . Cholecystitis 02/25/2014  . Personal history of colonic polyps 01/06/2012  . Special screening for malignant neoplasms, colon 11/15/2011  . Pruritus ani 11/15/2011  . COPD (chronic obstructive pulmonary disease) ([place]) 01/11/2011  . DOE (dyspnea on exertion) 01/01/2011  . Fissure in ano 12/21/2010  . HYPERLIPIDEMIA 01/11/2009  . Obstructive sleep apnea 01/11/2009  .  ARTHRITIS, RHEUMATOID 01/11/2009  . OSTEOARTHRITIS 01/11/2009     Lanney Gins, PT, DPT 06/18/16 5:23 PM   [facility] [address]  [place], [place], [address] Phone: [phone]   Fax:  [phone]  Name: KAIAN FAHS MRN: [id number] Date of Birth: 17-Mar-1929

## 2016-06-20 ENCOUNTER — Ambulatory Visit: Payer: Medicare Other

## 2016-06-20 DIAGNOSIS — R2689 Other abnormalities of gait and mobility: Secondary | ICD-10-CM

## 2016-06-20 DIAGNOSIS — R2681 Unsteadiness on feet: Secondary | ICD-10-CM

## 2016-06-20 NOTE — Telephone Encounter (Signed)
Pt was seen by [name] on 06/14/16.

## 2016-06-20 NOTE — Therapy (Addendum)
[facility] [address]  [place], [place], [address] Phone: [phone]   Fax:  [phone]  Physical Therapy Treatment  Patient Details  Name: Lance Powell MRN: [id number] Date of Birth: February 03, 1929 Referring Provider: [name] PA-C  Encounter Date: 06/20/2016      PT End of Session - 06/20/16 1339    Visit Number 13   Number of Visits 16   Date for PT Re-Evaluation 07/01/16   Authorization Type Medicare   PT Start Time 0962   PT Stop Time 1402   PT Time Calculation (min) 45 min   Activity Tolerance Patient tolerated treatment well   Behavior During Therapy [facility] for tasks assessed/performed      Past Medical History:  Diagnosis Date  . COPD (chronic obstructive pulmonary disease) ([place])   . Depression 03/08/2014  . Diabetes mellitus   . GERD (gastroesophageal reflux disease) 03/08/2014  . History of blood transfusion   . Hyperlipemia   . Hypertension   . OSA (obstructive sleep apnea)   . Osteoarthritis   . Rheumatoid arthritis(714.0)   . Sleep apnea    cpap    Past Surgical History:  Procedure Laterality Date  . APPENDECTOMY  1969  . CHOLECYSTECTOMY N/A 06/16/2014   Procedure: LAPAROSCOPIC CHOLECYSTECTOMY;  Surgeon: Greer Pickerel, MD;  Location: [facility];  Service: General;  Laterality: N/A;  . CYSTOSCOPY  01/23/2011   Procedure: CYSTOSCOPY;  Surgeon: Molli Hazard, MD;  [facility]: [facility];  Service: Urology;  Laterality: N/A;  . FLEXIBLE SIGMOIDOSCOPY  01/07/2012   Procedure: FLEXIBLE SIGMOIDOSCOPY;  Surgeon: Ladene Artist, MD,FACG;  Location: [facility];  Service: Endoscopy;  Laterality: N/A;  . HERNIA REPAIR  1990   right and left inguinal  . NISSEN FUNDOPLICATION    . 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 TONSILLECTOMY  1937  . TRANSURETHRAL RESECTION OF PROSTATE  01/23/2011   Procedure: TRANSURETHRAL RESECTION OF THE PROSTATE WITH GYRUS INSTRUMENTS;   Surgeon: Molli Hazard, MD;  Location: [facility];  Service: Urology;  Laterality: N/A;  . VASECTOMY  1972    There were no vitals filed for this visit.      Subjective Assessment - 06/20/16 1610    Subjective pt. doing well today.   Patient Stated Goals improve endurance and strength.    Currently in Pain? No/denies   Multiple Pain Sites No            [facility] PT Assessment - 06/20/16 1341      Observation/Other Assessments   Focus on Therapeutic Outcomes (FOTO)  58% (42% limitation)      Strength   Strength Assessment Site Hip;Knee;Ankle   Right Hip Flexion 4/5   Left Hip Flexion 4/5   Right/Left Knee Right;Left   Right Knee Flexion 4+/5   Right Knee Extension 5/5   Left Knee Flexion 4+/5   Left Knee Extension 4+/5   Right/Left Ankle Right;Left   Right Ankle Dorsiflexion 4+/5   Left Ankle Dorsiflexion 4+/5                     [facility] Adult PT Treatment/Exercise - 06/20/16 1411      Self-Care   Self-Care Other Self-Care Comments   Other Self-Care Comments  Discussion of current gym and HEP program to check for appropriateness      Knee/Hip Exercises: Stretches   Other Knee/Hip Stretches --     Knee/Hip  Exercises: Aerobic   Nustep L5 x 6 minutes     Knee/Hip Exercises: Standing   Knee Flexion Right;Left;Strengthening;1 set;15 reps   Knee Flexion Limitations 5   Hip Flexion Stengthening;Right;Left;1 set;Knee bent;15 reps   Hip Flexion Limitations 5   Hip Abduction Stengthening;Knee straight;Right;Left;15 reps   Abduction Limitations 5   Hip Extension Stengthening;Knee straight;Right;Left;15 reps   Extension Limitations 5      Knee/Hip Exercises: Seated   Long Arc Quad Strengthening;Both;1 set;15 reps;Weights   Long Arc Quad Weight 5 lbs.                  PT Short Term Goals - 06/06/16 1354      PT SHORT TERM GOAL #1   Title Patient to be independent with initial HEP consisting of balance and strengthening  (06/03/16)   Status Achieved           PT Long Term Goals - 2016-07-18 1344      PT LONG TERM GOAL #1   Title Patient to be independent with advanced HEP (07/01/16)   Status Achieved     PT LONG TERM GOAL #2   Title Patient to improve B LE strength to at least 4+/5 (07/01/16)   Status Partially Met  2016/07/18: B hip flexors still 4/5     PT LONG TERM GOAL #3   Title Patient to improve Berg Balance to >/= 52/56 (07/01/16)   Status Achieved  5.17.18: 54/56 BERG balance      PT LONG TERM GOAL #4   Title Patient to report initiation and 3-5 day/week compliance with walking program for improved endurance and functional mobility (07/01/16)   Status Achieved  5.17.18: performing on treadmill at local gym     PT LONG TERM GOAL #5   Title Patient to verbalize community resources available to him (07/01/16)   Status Achieved               Plan - 07/18/16 1402    Clinical Impression Statement Pt. doing well today and verbalized desire to transition to home/gym program and finish with therapy.  Pt. has now met all LTG's with exception of partially meeting strength goal.  HEP reviewed with pt. today and PT agreeing to d/c.   PT Treatment/Interventions ADLs/Self Care Home Management;Cryotherapy;Electrical Stimulation;Moist Heat;Ultrasound;Neuromuscular re-education;Balance training;Therapeutic exercise;Therapeutic activities;Functional mobility training;Stair training;Gait training;DME Instruction;Patient/family education;Manual techniques;Vasopneumatic Device;Taping;Dry needling;Energy conservation   PT Next Visit Plan d/c       Patient will benefit from skilled therapeutic intervention in order to improve the following deficits and impairments:  Decreased activity tolerance, Decreased balance, Cardiopulmonary status limiting activity, Decreased mobility, Decreased strength, Difficulty walking  Visit Diagnosis: Other abnormalities of gait and mobility  Unsteadiness on feet        G-Codes - July 18, 2016 1613    Functional Assessment Tool Used (Outpatient Only) FOTO: 58% (42% limitation)    Functional Limitation Mobility: Walking and moving around   Mobility: Walking and Moving Around Goal Status [phone]) At least 40 percent but less than 60 percent impaired, limited or restricted   Mobility: Walking and Moving Around Discharge Status [phone]) At least 40 percent but less than 60 percent impaired, limited or restricted      Problem List Patient Active Problem List   Diagnosis Date Noted  . Hip pain 07/27/2014  . Abrasion 07/27/2014  . Insomnia 06/27/2014  . Chronic cholecystitis 06/16/2014  . Coccyx pain 03/30/2014  . Diabetes mellitus type II, controlled ([place]) 03/08/2014  . Depression 03/08/2014  .  GERD (gastroesophageal reflux disease) 03/08/2014  . Hyperlipidemia 03/08/2014  . Abdominal pain   . Diverticulitis of large intestine without perforation or abscess without bleeding   . Blood poisoning   . Calculus of gallbladder with acute cholecystitis 02/27/2014  . Acute cholecystitis   . Diverticulitis 02/25/2014  . Urinary retention 02/25/2014  . Hypertension 02/25/2014  . SBO (small bowel obstruction) ([place]) 02/25/2014  . Sepsis ([place]) 02/25/2014  . Cholecystitis 02/25/2014  . Personal history of colonic polyps 01/06/2012  . Special screening for malignant neoplasms, colon 11/15/2011  . Pruritus ani 11/15/2011  . COPD (chronic obstructive pulmonary disease) ([place]) 01/11/2011  . DOE (dyspnea on exertion) 01/01/2011  . Fissure in ano 12/21/2010  . HYPERLIPIDEMIA 01/11/2009  . Obstructive sleep apnea 01/11/2009  . ARTHRITIS, RHEUMATOID 01/11/2009  . OSTEOARTHRITIS 01/11/2009    Bess Harvest, PTA 06/20/16 6:12 PM   PHYSICAL THERAPY DISCHARGE SUMMARY  Visits from Start of Care: 13   Current functional level related to goals / functional outcomes: See above   Remaining deficits: See above   Education / Equipment: HEP  Plan: Patient agrees to  discharge.  Patient goals were met. Patient is being discharged due to meeting the stated rehab goals.  ?????    Lanney Gins, PT, DPT 07/08/16 9:49 AM    [facility] [address]  [place], [place], [address] Phone: [phone]   Fax:  [phone]  Name: DAMARIO GILLIE MRN: [id number] Date of Birth: [birth year]

## 2016-06-23 ENCOUNTER — Other Ambulatory Visit: Payer: Self-pay | Admitting: Medical

## 2016-06-28 ENCOUNTER — Other Ambulatory Visit (INDEPENDENT_AMBULATORY_CARE_PROVIDER_SITE_OTHER): Payer: Medicare Other

## 2016-06-28 DIAGNOSIS — W57XXXA Bitten or stung by nonvenomous insect and other nonvenomous arthropods, initial encounter: Secondary | ICD-10-CM

## 2016-06-28 DIAGNOSIS — S50862A Insect bite (nonvenomous) of left forearm, initial encounter: Secondary | ICD-10-CM

## 2016-06-28 DIAGNOSIS — S90862A Insect bite (nonvenomous), left foot, initial encounter: Secondary | ICD-10-CM | POA: Diagnosis not present

## 2016-06-28 DIAGNOSIS — S90462A Insect bite (nonvenomous), left great toe, initial encounter: Secondary | ICD-10-CM

## 2016-06-30 DIAGNOSIS — J449 Chronic obstructive pulmonary disease, unspecified: Secondary | ICD-10-CM | POA: Diagnosis not present

## 2016-06-30 DIAGNOSIS — G4733 Obstructive sleep apnea (adult) (pediatric): Secondary | ICD-10-CM | POA: Diagnosis not present

## 2016-07-01 LAB — LYME AB/WESTERN BLOT REFLEX

## 2016-07-02 LAB — REFLEX RMSF IGG TITER

## 2016-07-02 LAB — ROCKY MTN SPOTTED FVR ABS PNL(IGG+IGM)
RMSF IGM: NOT DETECTED
RMSF IgG: DETECTED — AB

## 2016-07-10 ENCOUNTER — Ambulatory Visit: Payer: Medicare Other | Admitting: Audiology

## 2016-07-17 ENCOUNTER — Ambulatory Visit: Payer: Medicare Other | Admitting: Medical

## 2016-07-21 ENCOUNTER — Other Ambulatory Visit: Payer: Self-pay | Admitting: Medical

## 2016-07-24 DIAGNOSIS — E119 Type 2 diabetes mellitus without complications: Secondary | ICD-10-CM | POA: Diagnosis not present

## 2016-07-26 ENCOUNTER — Other Ambulatory Visit: Payer: Self-pay | Admitting: Medical

## 2016-07-26 MED FILL — INVOKANA 100 MG TABLET: 100 | 30 days supply | Qty: 30 | Fill #1

## 2016-07-30 DIAGNOSIS — G4733 Obstructive sleep apnea (adult) (pediatric): Secondary | ICD-10-CM | POA: Diagnosis not present

## 2016-07-30 DIAGNOSIS — J449 Chronic obstructive pulmonary disease, unspecified: Secondary | ICD-10-CM | POA: Diagnosis not present

## 2016-08-06 ENCOUNTER — Telehealth: Payer: Self-pay | Admitting: Medical

## 2016-08-06 NOTE — Telephone Encounter (Signed)
We do not have him on Novolog.  Lance Powell is patient suppose to be on that?

## 2016-08-06 NOTE — Telephone Encounter (Signed)
Caller name:[employer] Relationship to patient: Can be reached:[phone] [facility]:  Reason for call:Patient was seen in hospital and prescribed novalog by Dr Joseph Berkshire on 08/02/16, they are requesting we do a PA for drug. They are faxing over PA form. Please advise.

## 2016-08-06 NOTE — Telephone Encounter (Signed)
So forward what I just sent you to [facility]. Put novolog on hold/cancel. I did not write. Unless his a1-c is poor on follow up does not need insulin. But I need current a1-c to say for sure. Please schedule.

## 2016-08-06 NOTE — Telephone Encounter (Signed)
I never wrote him insulin. The ED may have. But I saw him in the spring. Added invokana to his metformin and sent for diabetic education. I followed up with patient and he got his sugars were under good control. On one of last visits most of his sugars were in mid to low 100's. However he is late for a follow up appointment and overdue for a1-c to make sure his sugars remained under control. If a1-c poor would need to rethink.   05-25-2016 Consistently sugar levels now  125-135 about 90% of the time. Occasonial higher 170. Low 97. On invokana and metformin.   But please get him scheduled this month. Need 30 minute appointment.

## 2016-08-07 NOTE — Telephone Encounter (Signed)
Call [facility] Medicare who initiated this call.  She stated that it looks like the patient had requested this medication.  They will notify patient.

## 2016-08-08 ENCOUNTER — Telehealth: Payer: Self-pay | Admitting: Medical

## 2016-08-08 NOTE — Telephone Encounter (Signed)
I do not see hospital note stating pt was prescribed Novalog. Please advise.

## 2016-08-08 NOTE — Telephone Encounter (Signed)
If you would look at phone notes. I think we have already contacted insurance and explained he is not on insulin

## 2016-08-20 ENCOUNTER — Other Ambulatory Visit: Payer: Self-pay | Admitting: Medical

## 2016-08-20 ENCOUNTER — Telehealth: Payer: Self-pay | Admitting: Emergency Medicine

## 2016-08-20 DIAGNOSIS — G4733 Obstructive sleep apnea (adult) (pediatric): Secondary | ICD-10-CM

## 2016-08-20 NOTE — Telephone Encounter (Signed)
atc pt unable to leave a vm. Will need to try call back later

## 2016-08-21 NOTE — Telephone Encounter (Signed)
Yes - if his CPAP is in disrepair OK to order replacement through DME. Thanks.

## 2016-08-21 NOTE — Telephone Encounter (Signed)
Called and spoke with pt and he stated that he is in need of a new cpap machine, his is not working properly and is about 42-81 years old.  RB please advise if you are ok to send in an order for a new machine.  Pt was last seen 12/2015.  No pending appts.  thanks

## 2016-08-21 NOTE — Telephone Encounter (Signed)
Spoke with patient. He is aware that RB has approved an order for a replacement CPAP machine. He uses [facility] as his DME. Will place order today.

## 2016-08-22 ENCOUNTER — Telehealth: Payer: Self-pay | Admitting: Emergency Medicine

## 2016-08-22 NOTE — Telephone Encounter (Signed)
Called spoke with patient, appt scheduled with RB for 8.7.18 @ 2pm Called spoke with Barbaraann Rondo w/ [facility] - he is aware of the pending appt  Nothing further needed; will sign off

## 2016-08-27 ENCOUNTER — Ambulatory Visit (INDEPENDENT_AMBULATORY_CARE_PROVIDER_SITE_OTHER): Payer: Medicare Other | Admitting: Emergency Medicine

## 2016-08-27 ENCOUNTER — Encounter: Payer: Self-pay | Admitting: Emergency Medicine

## 2016-08-27 VITALS — BP 144/84 | HR 107 | Ht 72.0 in | Wt 235.0 lb

## 2016-08-27 DIAGNOSIS — G4733 Obstructive sleep apnea (adult) (pediatric): Secondary | ICD-10-CM

## 2016-08-27 NOTE — Patient Instructions (Addendum)
We will request a replacement CPAP machine from [facility] because the humidification on your current device is not working. I would like for [facility] to confirm adequate interface between the new device and your existing mask and tubing.  Continue your CPAP and oxygen every night. Your adherence with the CPAP has been excellent Keep albuterol available to use puffs up to every 4 hours if needed for shortness of breath.  Wear your oxygen with exertion. You can go without it at rest during the day.  Follow with Dr Lamonte Sakai in 6 months or sooner if you have any problems

## 2016-08-27 NOTE — Progress Notes (Signed)
Subjective:    Patient ID: Lance Powell, male    DOB: 1930-01-17, 83 y.o.   MRN: [id number]  HPI 82 year old man with a history of former tobacco use (120 pack years), COPD and obstructive sleep apnea. He's been previously followed by Dr [name]. He is on O2 2L/min continuous. He is very reliable about CPAP use, benefits from it. Compliance on download was 99%, 98% > 4 hours. Resultant AHI 2.3. He exacerbates . He was started by T Parrett on stiolto but he can't recall whether he ever took it or whether he benefited.  He does not recall any recent exacerbations. He was treated for PNA before but he cannot recall the details.   ROV 07/12/15 -- follow-up visit for patient with obstructive sleep apnea on CPAP as well as COPD and chronic hypoxemic respiratory failure. At his last visit we did a trial of starting Stiolto - he does not believe that he has benefited, used it for 5 days. He didn't feel that it helped him. He is using Pro-Air prn, once a week. Minimal cough, minimal dyspnea. He is wearing oxygen at 2 L/m at all times.  ROV 01/08/16 -- This follow-up visit for patient with a history of obstructive sleep apnea on CPAP. He also has chronic hypoxemic respiratory failure in the setting of COPD. We have discussed and tried scheduled bronchodilators but he is not using at this time. He does use albuterol approximately . He complains today of nasal congestion, rhinorrhea . He wears his CPAP reliably, never mises a night. He feels that he benefits from it. Has more daytime energy. His sleep pattern is abnormal - he has been sleeping till 11:00-12:00pm, then cannot get to sleep at night when he goes to bed.   ROV 08/27/16 -- Patient has a history of obstructive sleep apnea for which he uses CPAP. He also has hypoxemic respiratory failure in the setting of COPD. He is not currently on bronchodilators. He reports that his CPAP has malfunctioned - his heater and humidification aren't working. No heat and he  ends up with same amount of water in his chamber in the morning. He has remained complaint even with this problem. Wears his CPAP every night, averages 10+ hours. He believes that he sleeps better, benefits during the day. Less sleepy, better functional capacity during the day. Has a full face mask. He has albuterol - never uses. Coughs daily, brings up white.    No flowsheet data found.  Review of Systems As per HPI  Past Medical History:  Diagnosis Date  . COPD (chronic obstructive pulmonary disease) ([place])   . Depression 03/08/2014  . Diabetes mellitus   . GERD (gastroesophageal reflux disease) 03/08/2014  . History of blood transfusion   . Hyperlipemia   . Hypertension   . OSA (obstructive sleep apnea)   . Osteoarthritis   . Rheumatoid arthritis(714.0)   . Sleep apnea    cpap     Family History  Problem Relation Age of Onset  . Heart disease Mother   . Emphysema Brother   . Cancer Daughter        breast     Social History   Social History  . Marital status: Married    Spouse name: N/A  . Number of children: 6  . Years of education: N/A   Occupational History  . Retired     Chief Financial Officer   Social History Main Topics  . Smoking status: Former Smoker    Packs/day: 4.00  Years: 30.00    Types: Cigarettes    Quit date: 01/21/1978  . Smokeless tobacco: Never Used  . Alcohol use No  . Drug use: No  . Sexual activity: Not on file   Other Topics Concern  . Not on file   Social History Narrative  . 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 albuterol (PROVENTIL HFA;VENTOLIN HFA) 108 (90 Base) MCG/ACT inhaler Inhale 2 puffs into the lungs every 6 (six) hours as needed for wheezing or shortness of breath. 1 Inhaler 2  . aspirin 81 MG tablet Take 1 tablet (81 mg total) by mouth every morning.  Stop for 1 week and then resume (Patient taking differently: Take 81 mg by mouth every morning. )    . atorvastatin (LIPITOR) 40 MG tablet TAKE ONE TABLET BY MOUTH ONCE DAILY IN THE MORNING 90 tablet 0  . celecoxib (CELEBREX) 200 MG capsule TAKE 1 CAPSULE BY MOUTH ONCE DAILY IN THE EVENING 30 capsule 1  . cholecalciferol (VITAMIN D) 1000 UNITS tablet Take 2,000 Units by mouth 2 (two) times daily.     . clonazePAM (KLONOPIN) 0.25 MG disintegrating tablet 1 tab po q hs prn insomnia or anxiety 10 tablet 2  . doxycycline (VIBRA-TABS) 100 MG tablet Take 1 tablet (100 mg total) by mouth 2 (two) times daily. Can give caps or generic 20 tablet 0  . enalapril (VASOTEC) 2.5 MG tablet Take 2.5 mg by mouth every morning. Reported on 07/12/2015    . glucose blood (BAYER CONTOUR TEST) test strip Use as instructed to check blood sugar twice a day.  DX  E11.9 100 each 5  . Indacaterol Maleate (ARCAPTA NEOHALER) 75 MCG CAPS Place 1 capsule into inhaler and inhale every morning. 90 capsule 1  . INVOKANA 100 MG TABS tablet TAKE 1 TABLET (100 MG TOTAL) BY MOUTH DAILY BEFORE BREAKFAST. 30 tablet 3  . ketoconazole (NIZORAL) 2 % cream As  Directed.  0  . ketoconazole (NIZORAL) 2 % shampoo As directed.  0  . lidocaine (LIDODERM) 5 % Place 1 patch onto the skin daily. Remove & Discard patch within 12 hours or as directed by MD 30 patch 0  . meclizine (ANTIVERT) 12.5 MG tablet TAKE 1 TABLET BY MOUTH THREE TIMES DAILY AS NEEDED FOR DIZZINESS 30 tablet 0  . metFORMIN (GLUCOPHAGE) 500 MG tablet TAKE 1 TABLET BY MOUTH THREE TIMES DAILY 90 tablet 1  . misoprostol (CYTOTEC) 100 MCG tablet TAKE ONE TABLET BY MOUTH ONCE DAILY IN THE MORNING, ONE TABLET ONCE DAILY IN THE EVENING, AND TWO TABLETS ONCE DAILY AT BEDTIME 120 tablet 2  . Multiple Vitamins-Minerals (CENTRUM) tablet Take 1 tablet by mouth every morning. Reported on 01/05/2015    . OVER THE COUNTER MEDICATION Place 1-2 drops into both eyes daily as needed (dry red eyes.).  Reported on 01/05/2015    . Tiotropium Bromide-Olodaterol (STIOLTO RESPIMAT) 2.5-2.5 MCG/ACT AERS Inhale 2 puffs into the lungs daily. 4 g 5  . tobramycin (TOBREX) 0.3 % ophthalmic solution Place 2 drops into the left eye every 6 (six) hours. 5 mL 0  . UNABLE TO FIND CPAP    . venlafaxine XR (EFFEXOR-XR) 37.5 MG 24 hr capsule TAKE 1 CAPSULE BY MOUTH ONCE DAILY WITH BREAKFAST 30 capsule 0  . vitamin C (ASCORBIC ACID)  500 MG tablet Take 500 mg by mouth every morning.     . Tiotropium Bromide-Olodaterol (STIOLTO RESPIMAT) 2.5-2.5 MCG/ACT AERS Inhale 2 puffs into the lungs daily. 4 g 2   No facility-administered medications prior to visit.          Objective:   Physical Exam Vitals:   08/27/16 1356  BP: (!) 144/84  Pulse: (!) 107  SpO2: 95%  Weight: 235 lb (106.6 kg)  Height: 6' (1.829 m)   Gen: Pleasant, overwt man, in no distress,  normal affect  ENT: No lesions,  mouth clear,  oropharynx clear, no postnasal drip  Neck: No JVD, no TMG, no carotid bruits  Lungs: No use of accessory muscles,  clear without rales or rhonchi  Cardiovascular: RRR, heart sounds normal, no murmur or gallops, no peripheral edema  Musculoskeletal: No deformities, no cyanosis or clubbing  Neuro: alert, non focal  Skin: Warm, no lesions or rashes      Assessment & Plan:  Obstructive sleep apnea We will request a replacement CPAP machine from [facility] because the humidification on your current device is not working. I would like for [facility] to confirm adequate interface between the new device and your existing mask and tubing.  Continue your CPAP and oxygen every night. Your adherence with the CPAP has been excellent Follow with Dr Lamonte Sakai in 6 months or sooner if you have any problems  COPD (chronic obstructive pulmonary disease) Keep albuterol available to use puffs up to every 4 hours if needed for shortness of breath.  Wear your oxygen with exertion. You can go without it at rest during the day.    [name], MD, PhD 08/27/2016, 2:31 PM [facility] [phone] or if no answer [phone]

## 2016-08-27 NOTE — Assessment & Plan Note (Signed)
Keep albuterol available to use puffs up to every 4 hours if needed for shortness of breath.  Wear your oxygen with exertion. You can go without it at rest during the day.

## 2016-08-27 NOTE — Assessment & Plan Note (Signed)
We will request a replacement CPAP machine from [facility] because the humidification on your current device is not working. I would like for [facility] to confirm adequate interface between the new device and your existing mask and tubing.  Continue your CPAP and oxygen every night. Your adherence with the CPAP has been excellent Follow with Dr Lamonte Sakai in 6 months or sooner if you have any problems

## 2016-08-30 DIAGNOSIS — G4733 Obstructive sleep apnea (adult) (pediatric): Secondary | ICD-10-CM | POA: Diagnosis not present

## 2016-08-30 DIAGNOSIS — J449 Chronic obstructive pulmonary disease, unspecified: Secondary | ICD-10-CM | POA: Diagnosis not present

## 2016-09-02 ENCOUNTER — Telehealth: Payer: Self-pay | Admitting: Emergency Medicine

## 2016-09-02 DIAGNOSIS — G4733 Obstructive sleep apnea (adult) (pediatric): Secondary | ICD-10-CM

## 2016-09-02 NOTE — Telephone Encounter (Signed)
Called and spoke to [place] with [facility]. He is needing a new order with the CPAP template. New order placed. Corene Cornea verbalized understanding and states RB will need to sign the order to process it. ATC pt, no VM available. WCB.

## 2016-09-02 NOTE — Telephone Encounter (Signed)
Order was signed by Dr. Lamonte Sakai this am.  Corene Cornea states needs new order with settings for patient's CPAP and needs signature as soon as possible.  [place], [place], [place].

## 2016-09-03 NOTE — Telephone Encounter (Signed)
ATC x2  

## 2016-09-05 NOTE — Telephone Encounter (Signed)
lmtcb for pt.  

## 2016-09-06 NOTE — Telephone Encounter (Signed)
I have attempted to call the pt x 2 but the call will not go through.

## 2016-09-09 NOTE — Telephone Encounter (Signed)
lmtcb x1 for pt. 

## 2016-09-10 NOTE — Telephone Encounter (Signed)
Called and spoke with pt and he stated that he has taken care of everything with [facility].  Nothing further is needed.

## 2016-09-12 DIAGNOSIS — G4733 Obstructive sleep apnea (adult) (pediatric): Secondary | ICD-10-CM | POA: Diagnosis not present

## 2016-09-12 DIAGNOSIS — J449 Chronic obstructive pulmonary disease, unspecified: Secondary | ICD-10-CM | POA: Diagnosis not present

## 2016-09-13 ENCOUNTER — Encounter: Payer: Self-pay | Admitting: Medical

## 2016-09-13 ENCOUNTER — Ambulatory Visit (INDEPENDENT_AMBULATORY_CARE_PROVIDER_SITE_OTHER): Payer: Medicare Other | Admitting: Medical

## 2016-09-13 VITALS — BP 119/79 | HR 92 | Temp 97.5°F | Resp 14 | Ht 74.0 in | Wt 242.2 lb

## 2016-09-13 DIAGNOSIS — J449 Chronic obstructive pulmonary disease, unspecified: Secondary | ICD-10-CM | POA: Diagnosis not present

## 2016-09-13 DIAGNOSIS — G4730 Sleep apnea, unspecified: Secondary | ICD-10-CM | POA: Diagnosis not present

## 2016-09-13 DIAGNOSIS — F329 Major depressive disorder, single episode, unspecified: Secondary | ICD-10-CM

## 2016-09-13 DIAGNOSIS — E119 Type 2 diabetes mellitus without complications: Secondary | ICD-10-CM | POA: Diagnosis not present

## 2016-09-13 DIAGNOSIS — F32A Depression, unspecified: Secondary | ICD-10-CM

## 2016-09-13 MED ORDER — CANAGLIFLOZIN 100 MG PO TABS: 100.0000 mg | ORAL_TABLET | Freq: Every day | ORAL | 3 refills | Status: DC

## 2016-09-13 NOTE — Progress Notes (Signed)
Subjective:    Patient ID: Lance Powell, male    DOB: Jan 24, 1929, 83 y.o.   MRN: [id number]  HPI  Pt in for follow up.  He just got new cpap machine yesterday. He indicates insurance will want him to have visit possibly with pulmonologist. He states much better machine.  He also got new hearing aids.  Pt states that most recently his invokana too expensive. It cost him $140. He plans to stop medication today since too expensive. He states will take metformin. But if sugars area increasing he will restart invokana.  Pt is taking effexor and it does help his mood.  Pt insurance want him to get annual wellness visit.     Review of Systems  Constitutional: Negative for chills, fatigue and fever.  HENT: Negative for congestion, drooling, facial swelling, mouth sores, nosebleeds, rhinorrhea and sinus pain.   Respiratory: Negative for cough, chest tightness, shortness of breath and wheezing.   Cardiovascular: Negative for chest pain and palpitations.  Gastrointestinal: Negative for abdominal distention, abdominal pain, blood in stool, constipation and diarrhea.  Musculoskeletal: Negative for back pain.  Skin: Negative for rash.  Neurological: Negative for dizziness and headaches.  Hematological: Negative for adenopathy. Does not bruise/bleed easily.  Psychiatric/Behavioral: Negative for behavioral problems and confusion.    Past Medical History:  Diagnosis Date  . COPD (chronic obstructive pulmonary disease) ([place])   . Depression 03/08/2014  . Diabetes mellitus   . GERD (gastroesophageal reflux disease) 03/08/2014  . History of blood transfusion   . Hyperlipemia   . Hypertension   . OSA (obstructive sleep apnea)   . Osteoarthritis   . Rheumatoid arthritis(714.0)   . Sleep apnea    cpap     Social History   Social History  . Marital status: Married    Spouse name: N/A  . Number of children: 6  . Years of education: N/A   Occupational History  . Retired    Chief Financial Officer   Social History Main Topics  . Smoking status: Former Smoker    Packs/day: 4.00    Years: 30.00    Types: Cigarettes    Quit date: 01/21/1978  . Smokeless tobacco: Never Used  . Alcohol use No  . Drug use: No  . Sexual activity: Not on file   Other Topics Concern  . Not on file   Social History Narrative  . No narrative on file    Past Surgical History:  Procedure Laterality Date  . APPENDECTOMY  1969  . CHOLECYSTECTOMY N/A 06/16/2014   Procedure: LAPAROSCOPIC CHOLECYSTECTOMY;  Surgeon: Greer Pickerel, MD;  Location: [facility];  Service: General;  Laterality: N/A;  . CYSTOSCOPY  01/23/2011   Procedure: CYSTOSCOPY;  Surgeon: Molli Hazard, MD;  Location: [facility];  Service: Urology;  Laterality: N/A;  . FLEXIBLE SIGMOIDOSCOPY  01/07/2012   Procedure: FLEXIBLE SIGMOIDOSCOPY;  Surgeon: Ladene Artist, MD,FACG;  Location: [facility];  Service: Endoscopy;  Laterality: N/A;  . HERNIA REPAIR  1990   right and left inguinal  . NISSEN FUNDOPLICATION    . 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 TONSILLECTOMY  1937  . TRANSURETHRAL RESECTION OF PROSTATE  01/23/2011   Procedure: TRANSURETHRAL RESECTION OF THE PROSTATE WITH GYRUS INSTRUMENTS;  Surgeon: Molli Hazard, MD;  Location: [facility];  Service: Urology;  Laterality: N/A;  . VASECTOMY  1972    Family History  Problem Relation Age of Onset  . Heart disease Mother   .  Emphysema Brother   . Cancer Daughter        breast    No Known Allergies  Current Outpatient Prescriptions on File Prior to Visit  Medication Sig Dispense Refill  . albuterol (PROVENTIL HFA;VENTOLIN HFA) 108 (90 Base) MCG/ACT inhaler Inhale 2 puffs into the lungs every 6 (six) hours as needed for wheezing or shortness of breath. 1 Inhaler 2  . aspirin 81 MG tablet Take 1 tablet (81 mg total) by mouth every morning. Stop for 1 week and then resume (Patient taking differently: Take 81 mg by mouth every  morning. )    . atorvastatin (LIPITOR) 40 MG tablet TAKE ONE TABLET BY MOUTH ONCE DAILY IN THE MORNING 90 tablet 0  . celecoxib (CELEBREX) 200 MG capsule TAKE 1 CAPSULE BY MOUTH ONCE DAILY IN THE EVENING 30 capsule 1  . cholecalciferol (VITAMIN D) 1000 UNITS tablet Take 2,000 Units by mouth 2 (two) times daily.     . clonazePAM (KLONOPIN) 0.25 MG disintegrating tablet 1 tab po q hs prn insomnia or anxiety 10 tablet 2  . doxycycline (VIBRA-TABS) 100 MG tablet Take 1 tablet (100 mg total) by mouth 2 (two) times daily. Can give caps or generic 20 tablet 0  . enalapril (VASOTEC) 2.5 MG tablet Take 2.5 mg by mouth every morning. Reported on 07/12/2015    . glucose blood (BAYER CONTOUR TEST) test strip Use as instructed to check blood sugar twice a day.  DX  E11.9 100 each 5  . Indacaterol Maleate (ARCAPTA NEOHALER) 75 MCG CAPS Place 1 capsule into inhaler and inhale every morning. 90 capsule 1  . INVOKANA 100 MG TABS tablet TAKE 1 TABLET (100 MG TOTAL) BY MOUTH DAILY BEFORE BREAKFAST. 30 tablet 3  . ketoconazole (NIZORAL) 2 % cream As  Directed.  0  . ketoconazole (NIZORAL) 2 % shampoo As directed.  0  . lidocaine (LIDODERM) 5 % Place 1 patch onto the skin daily. Remove & Discard patch within 12 hours or as directed by MD 30 patch 0  . meclizine (ANTIVERT) 12.5 MG tablet TAKE 1 TABLET BY MOUTH THREE TIMES DAILY AS NEEDED FOR DIZZINESS 30 tablet 0  . metFORMIN (GLUCOPHAGE) 500 MG tablet TAKE 1 TABLET BY MOUTH THREE TIMES DAILY 90 tablet 1  . misoprostol (CYTOTEC) 100 MCG tablet TAKE ONE TABLET BY MOUTH ONCE DAILY IN THE MORNING, ONE TABLET ONCE DAILY IN THE EVENING, AND TWO TABLETS ONCE DAILY AT BEDTIME 120 tablet 2  . Multiple Vitamins-Minerals (CENTRUM) tablet Take 1 tablet by mouth every morning. Reported on 01/05/2015    . OVER THE COUNTER MEDICATION Place 1-2 drops into both eyes daily as needed (dry red eyes.). Reported on 01/05/2015    . Tiotropium Bromide-Olodaterol (STIOLTO RESPIMAT) 2.5-2.5  MCG/ACT AERS Inhale 2 puffs into the lungs daily. 4 g 5  . tobramycin (TOBREX) 0.3 % ophthalmic solution Place 2 drops into the left eye every 6 (six) hours. 5 mL 0  . UNABLE TO FIND CPAP    . venlafaxine XR (EFFEXOR-XR) 37.5 MG 24 hr capsule TAKE 1 CAPSULE BY MOUTH ONCE DAILY WITH BREAKFAST 30 capsule 0  . vitamin C (ASCORBIC ACID) 500 MG tablet Take 500 mg by mouth every morning.      No current facility-administered medications on file prior to visit.     BP 119/79   Pulse 92   Temp (!) 97.5 F (36.4 C) (Oral)   Resp 14   Ht 6\' 2"  (1.88 m)   Wt 242  lb 3.2 oz (109.9 kg)   SpO2 96%   BMI 31.10 kg/m    .     Objective:   Physical Exam  Physical Exam  General Mental Status- Alert. General Appearance- Not in acute distress.   Skin General: Color- Normal Color. Moisture- Normal Moisture. No obvious tick bite marks on his left forearm or on his left great toe.  Neck Carotid Arteries- Normal color. Moisture- Normal Moisture. No carotid bruits. No JVD.  Chest and Lung Exam Auscultation: Breath Sounds:-Normal.  Cardiovascular Auscultation:Rythm- Regular. Murmurs & Other Heart Sounds:Auscultation of the heart reveals- No Murmurs.  Abdomen Inspection:-Inspeection Normal. Palpation/Percussion:Note:No mass. Palpation and Percussion of the abdomen reveal- Non Tender, Non Distended + BS, no rebound or guarding.   Neurologic Cranial Nerve exam:- CN III-XII intact(No nystagmus), symmetric smile.  Strength:- 5/5 equal and symmetric strength both upper and lower extremities.  Lower ext- see quality metrics.    Assessment & Plan:  For your diabetes will get a metabolic panel and S9G today. Continue the metformin. Would encourage you do continue the invokana as well since your sugars in the recent past improved a lot with the medication.  I'm going to give a prescription of generic invokana and hopefully that will be cheaper.  For your prior mild depression  continue Effexor.  For sleep apnea continue CPAP. I will refer you to your pulmonologist that way if you have any problems with your machine in the future they can address it easier.  Also will try to get to schedule for annual wellness exam with RN. If they can't schedule you that he can be done with me.  Follow-up in 2-3 months or as needed    Lance Powell, Lance Powell, [place]

## 2016-09-13 NOTE — Patient Instructions (Addendum)
For your diabetes will get a metabolic panel and J2U today. Continue the metformin. Would encourage you do continue the invokana as well since your sugars in the recent past improved a lot with the medication.  I'm going to give a prescription of generic invokana and hopefully that will be cheaper.  For your prior mild depression continue Effexor.  For sleep apnea continue CPAP. I will refer you to your  Pulmonologist  that way if you have any problems with your machine in the future they can address it easier.  Also will try to get to schedule for annual wellness exam with RN. If they can't schedule you that he can be done with me.  Follow-up in 2-3 months or as needed

## 2016-09-14 LAB — COMPREHENSIVE METABOLIC PANEL
ALBUMIN: 4 g/dL (ref 3.6–5.1)
ALK PHOS: 65 U/L (ref 40–115)
ALT: 25 U/L (ref 9–46)
AST: 17 U/L (ref 10–35)
BILIRUBIN TOTAL: 0.5 mg/dL (ref 0.2–1.2)
BUN: 20 mg/dL (ref 7–25)
CALCIUM: 10.6 mg/dL — AB (ref 8.6–10.3)
CO2: 26 mmol/L (ref 20–32)
Chloride: 104 mmol/L (ref 98–110)
Creat: 1.06 mg/dL (ref 0.70–1.11)
GLUCOSE: 109 mg/dL — AB (ref 65–99)
Potassium: 4.5 mmol/L (ref 3.5–5.3)
Sodium: 141 mmol/L (ref 135–146)
TOTAL PROTEIN: 6.5 g/dL (ref 6.1–8.1)

## 2016-09-14 LAB — HEMOGLOBIN A1C
HEMOGLOBIN A1C: 6.5 % — AB (ref <5.7)
Mean Plasma Glucose: 140 mg/dL

## 2016-09-14 LAB — MICROALBUMIN, URINE: Microalb, Ur: 0.5 mg/dL

## 2016-09-16 ENCOUNTER — Telehealth: Payer: Self-pay | Admitting: Medical

## 2016-09-16 NOTE — Telephone Encounter (Signed)
Left pt a detailed message

## 2016-09-16 NOTE — Telephone Encounter (Signed)
Patient called for results he said it is ok to leave results on answering machine since he is traveling out

## 2016-09-17 DIAGNOSIS — G4733 Obstructive sleep apnea (adult) (pediatric): Secondary | ICD-10-CM | POA: Diagnosis not present

## 2016-09-17 NOTE — Progress Notes (Addendum)
Subjective:   Lance Powell is a 83 y.o. male who presents for Medicare Annual/Subsequent preventive examination.  Review of Systems:  No ROS.  Medicare Wellness Visit. Additional risk factors are reflected in the social history. Cardiac Risk Factors include: advanced age (>24men, >24 women);diabetes mellitus;dyslipidemia;hypertension;male gender;smoking/ tobacco exposure Sleep patterns: Wears CPAP. Sleeps 11-12 hrs. Feels rested. Home Safety/Smoke Alarms: Feels safe in home. Smoke alarms in place.  Living environment; residence and Firearm Safety:no stairs. Lives alone. Daughter comes by daily.  Seat Belt Safety/Bike Helmet: Wears seat belt.  Male:   CCS- No longer doing routine screening due to age.     PSA- No results found for: PSA      Objective:    Vitals: BP 138/80 (BP Location: Right Arm, Patient Position: Sitting, Cuff Size: Normal)   Pulse 98   Ht 6\' 2"  (1.88 m)   Wt 245 lb 6.4 oz (111.3 kg)   SpO2 97% Comment: 2L via [place]  BMI 31.51 kg/m   Body mass index is 31.51 kg/m.  Tobacco History  Smoking Status  . Former Smoker  . Packs/day: 4.00  . Years: 30.00  . Types: Cigarettes  . Quit date: 01/21/1978  Smokeless Tobacco  . Never Used     Counseling given: Not Answered   Past Medical History:  Diagnosis Date  . COPD (chronic obstructive pulmonary disease) ([place])   . Depression 03/08/2014  . Diabetes mellitus   . GERD (gastroesophageal reflux disease) 03/08/2014  . History of blood transfusion   . Hyperlipemia   . Hypertension   . OSA (obstructive sleep apnea)   . Osteoarthritis   . Rheumatoid arthritis(714.0)   . Sleep apnea    cpap   Past Surgical History:  Procedure Laterality Date  . APPENDECTOMY  1969  . CHOLECYSTECTOMY N/A 06/16/2014   Procedure: LAPAROSCOPIC CHOLECYSTECTOMY;  Surgeon: Greer Pickerel, MD;  Location: [facility];  Service: General;  Laterality: N/A;  . CYSTOSCOPY  01/23/2011   Procedure: CYSTOSCOPY;  Surgeon: Molli Hazard,  MD;  Location: [facility];  Service: Urology;  Laterality: N/A;  . FLEXIBLE SIGMOIDOSCOPY  01/07/2012   Procedure: FLEXIBLE SIGMOIDOSCOPY;  Surgeon: Ladene Artist, MD,FACG;  Location: [facility];  Service: Endoscopy;  Laterality: N/A;  . HERNIA REPAIR  1990   right and left inguinal  . NISSEN FUNDOPLICATION    . 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 TONSILLECTOMY  1937  . TRANSURETHRAL RESECTION OF PROSTATE  01/23/2011   Procedure: TRANSURETHRAL RESECTION OF THE PROSTATE WITH GYRUS INSTRUMENTS;  Surgeon: Molli Hazard, MD;  Location: [facility];  Service: Urology;  Laterality: N/A;  . VASECTOMY  1972   Family History  Problem Relation Age of Onset  . Heart disease Mother   . Emphysema Brother   . Cancer Daughter        breast   History  Sexual Activity  . Sexual activity: Not on file    Outpatient Encounter Prescriptions as of 09/19/2016  Medication Sig  . albuterol (PROVENTIL HFA;VENTOLIN HFA) 108 (90 Base) MCG/ACT inhaler Inhale 2 puffs into the lungs every 6 (six) hours as needed for wheezing or shortness of breath.  Marland Kitchen aspirin 81 MG tablet Take 1 tablet (81 mg total) by mouth every morning. Stop for 1 week and then resume (Patient taking differently: Take 81 mg by mouth every morning. )  . atorvastatin (LIPITOR) 40 MG tablet TAKE ONE TABLET BY MOUTH ONCE DAILY IN THE MORNING  .  canagliflozin (INVOKANA) 100 MG TABS tablet Take 1 tablet (100 mg total) by mouth daily before breakfast.  . celecoxib (CELEBREX) 200 MG capsule TAKE 1 CAPSULE BY MOUTH ONCE DAILY IN THE EVENING  . cholecalciferol (VITAMIN D) 1000 UNITS tablet Take 2,000 Units by mouth 2 (two) times daily.   . clonazePAM (KLONOPIN) 0.25 MG disintegrating tablet 1 tab po q hs prn insomnia or anxiety  . enalapril (VASOTEC) 2.5 MG tablet Take 2.5 mg by mouth every morning. Reported on 07/12/2015  . glucose blood (BAYER CONTOUR TEST) test strip Use as instructed to check blood  sugar twice a day.  DX  E11.9  . Indacaterol Maleate (ARCAPTA NEOHALER) 75 MCG CAPS Place 1 capsule into inhaler and inhale every morning.  Marland Kitchen ketoconazole (NIZORAL) 2 % cream As  Directed.  Marland Kitchen ketoconazole (NIZORAL) 2 % shampoo As directed.  . lidocaine (LIDODERM) 5 % Place 1 patch onto the skin daily. Remove & Discard patch within 12 hours or as directed by MD  . meclizine (ANTIVERT) 12.5 MG tablet TAKE 1 TABLET BY MOUTH THREE TIMES DAILY AS NEEDED FOR DIZZINESS  . metFORMIN (GLUCOPHAGE) 500 MG tablet TAKE 1 TABLET BY MOUTH THREE TIMES DAILY  . misoprostol (CYTOTEC) 100 MCG tablet TAKE ONE TABLET BY MOUTH ONCE DAILY IN THE MORNING, ONE TABLET ONCE DAILY IN THE EVENING, AND TWO TABLETS ONCE DAILY AT BEDTIME  . Multiple Vitamins-Minerals (CENTRUM) tablet Take 1 tablet by mouth every morning. Reported on 01/05/2015  . OVER THE COUNTER MEDICATION Place 1-2 drops into both eyes daily as needed (dry red eyes.). Reported on 01/05/2015  . Tiotropium Bromide-Olodaterol (STIOLTO RESPIMAT) 2.5-2.5 MCG/ACT AERS Inhale 2 puffs into the lungs daily.  Marland Kitchen tobramycin (TOBREX) 0.3 % ophthalmic solution Place 2 drops into the left eye every 6 (six) hours.  Marland Kitchen UNABLE TO FIND CPAP  . venlafaxine XR (EFFEXOR-XR) 37.5 MG 24 hr capsule TAKE 1 CAPSULE BY MOUTH ONCE DAILY WITH BREAKFAST  . vitamin C (ASCORBIC ACID) 500 MG tablet Take 500 mg by mouth every morning.   . [DISCONTINUED] doxycycline (VIBRA-TABS) 100 MG tablet Take 1 tablet (100 mg total) by mouth 2 (two) times daily. Can give caps or generic  . [DISCONTINUED] INVOKANA 100 MG TABS tablet TAKE 1 TABLET (100 MG TOTAL) BY MOUTH DAILY BEFORE BREAKFAST.   No facility-administered encounter medications on file as of 09/19/2016.     Activities of Daily Living In your present state of health, do you have any difficulty performing the following activities: 09/19/2016  Hearing? N  Comment wearing hearing aids.  Vision? N  Comment wears glasses for reading  Difficulty  concentrating or making decisions? N  Walking or climbing stairs? N  Comment SOB  with exertion  Dressing or bathing? N  Doing errands, shopping? N  Preparing Food and eating ? N  Using the Toilet? N  In the past six months, have you accidently leaked urine? Y  Do you have problems with loss of bowel control? N  Managing your Medications? N  Managing your Finances? N  Housekeeping or managing your Housekeeping? N  Some recent data might be hidden    Patient Care Team: [name], Iris Pert as PCP - General (Physician Assistant) Collene Gobble, MD as Consulting Physician (Pulmonary Disease) [name], MD as Consulting Physician (Ophthalmology)   Assessment:    Physical assessment deferred to PCP.  Exercise Activities and Dietary recommendations Current Exercise Habits: The patient does not participate in regular exercise at present;Structured exercise class, Type  of exercise: strength training/weights, Frequency (Times/Week): 1, Intensity: Mild       Goals    . maintain healthy lifestyle.      Fall Risk Fall Risk  05/24/2016 06/28/2015 03/27/2015 03/08/2014  Falls in the past year? No No No No   Depression Screen PHQ 2/9 Scores 09/19/2016 05/24/2016 06/28/2015 03/27/2015  PHQ - 2 Score 0 0 0 0  PHQ- 9 Score - - - -  Exception Documentation - - Patient refusal -    Cognitive Function MMSE - Mini Mental State Exam 09/19/2016  Orientation to time 5  Orientation to Place 5  Registration 3  Attention/ Calculation 5  Recall 3  Language- name 2 objects 2  Language- repeat 1  Language- follow 3 step command 3  Language- read & follow direction 1  Write a sentence 1  Copy design 1  Total score 30        Immunization History  Administered Date(s) Administered  . Influenza Split 11/11/2010, 10/22/2011  . Influenza Whole 01/22/2008, 11/22/2009  . Influenza, High Dose Seasonal PF 09/27/2015  . Influenza,inj,Quad PF,6+ Mos 10/19/2012, 11/03/2013, 11/10/2014  . Pneumococcal  Conjugate-13 03/27/2015  . Pneumococcal Polysaccharide-23 01/22/2007  . Tdap 03/27/2015   Screening Tests Health Maintenance  Topic Date Due  . INFLUENZA VACCINE  08/21/2016  . OPHTHALMOLOGY EXAM  12/20/2016  . HEMOGLOBIN A1C  03/16/2017  . FOOT EXAM  09/13/2017  . TETANUS/TDAP  03/26/2025  . PNA vac Low Risk Adult  Completed      Plan:   Follow up with [name] PA 12/04/16.  Continue to eat heart healthy diet (full of fruits, vegetables, whole grains, lean protein, water--limit salt, fat, and sugar intake) and increase physical activity as tolerated.  Continue doing brain stimulating activities (puzzles, reading, adult coloring books, staying active) to keep memory sharp.    I have personally reviewed and noted the following in the patient's chart:   . Medical and social history . Use of alcohol, tobacco or illicit drugs  . Current medications and supplements . Functional ability and status . Nutritional status . Physical activity . Advanced directives . List of other physicians . Hospitalizations, surgeries, and ER visits in previous 12 months . Vitals . Screenings to include cognitive, depression, and falls . Referrals and appointments  In addition, I have reviewed and discussed with patient certain preventive protocols, quality metrics, and best practice recommendations. A written personalized care plan for preventive services as well as general preventive health recommendations were provided to patient.     Shela Nevin, RN  09/19/2016  I agree with Madelin Rear and evaluation of RN.  [name], [name], PA-C

## 2016-09-19 ENCOUNTER — Encounter: Payer: Self-pay | Admitting: *Deleted

## 2016-09-19 ENCOUNTER — Ambulatory Visit (INDEPENDENT_AMBULATORY_CARE_PROVIDER_SITE_OTHER): Payer: Medicare Other | Admitting: *Deleted

## 2016-09-19 VITALS — BP 138/80 | HR 98 | Ht 74.0 in | Wt 245.4 lb

## 2016-09-19 DIAGNOSIS — Z0000 Encounter for general adult medical examination without abnormal findings: Secondary | ICD-10-CM

## 2016-09-19 NOTE — Patient Instructions (Signed)
Lance Powell , Thank you for taking time to come for your Medicare Wellness Visit. I appreciate your ongoing commitment to your health goals. Please review the following plan we discussed and let me know if I can assist you in the future.   These are the goals we discussed: Goals    . maintain healthy lifestyle.       This is a list of the screening recommended for you and due dates:  Health Maintenance  Topic Date Due  . Flu Shot  08/21/2016  . Eye exam for diabetics  12/20/2016  . Hemoglobin A1C  03/16/2017  . Complete foot exam   09/13/2017  . Tetanus Vaccine  03/26/2025  . Pneumonia vaccines  Completed   Continue to eat heart healthy diet (full of fruits, vegetables, whole grains, lean protein, water--limit salt, fat, and sugar intake) and increase physical activity as tolerated.  Continue doing brain stimulating activities (puzzles, reading, adult coloring books, staying active) to keep memory sharp.    Health Maintenance, Male A healthy lifestyle and preventive care is important for your health and wellness. Ask your health care provider about what schedule of regular examinations is right for you. What should I know about weight and diet? Eat a Healthy Diet  Eat plenty of vegetables, fruits, whole grains, low-fat dairy products, and lean protein.  Do not eat a lot of foods high in solid fats, added sugars, or salt.  Maintain a Healthy Weight Regular exercise can help you achieve or maintain a healthy weight. You should:  Do at least 150 minutes of exercise each week. The exercise should increase your heart rate and make you sweat (moderate-intensity exercise).  Do strength-training exercises at least twice a week.  Watch Your Levels of Cholesterol and Blood Lipids  Have your blood tested for lipids and cholesterol every 5 years starting at 81 years of age. If you are at high risk for heart disease, you should start having your blood tested when you are 81 years old.  You may need to have your cholesterol levels checked more often if: ? Your lipid or cholesterol levels are high. ? You are older than 81 years of age. ? You are at high risk for heart disease.  What should I know about cancer screening? Many types of cancers can be detected early and may often be prevented. Lung Cancer  You should be screened every year for lung cancer if: ? You are a current smoker who has smoked for at least 30 years. ? You are a former smoker who has quit within the past 15 years.  Talk to your health care provider about your screening options, when you should start screening, and how often you should be screened.  Colorectal Cancer  Routine colorectal cancer screening usually begins at 81 years of age and should be repeated every 5-10 years until you are 81 years old. You may need to be screened more often if early forms of precancerous polyps or small growths are found. Your health care provider may recommend screening at an earlier age if you have risk factors for colon cancer.  Your health care provider may recommend using home test kits to check for hidden blood in the stool.  A small camera at the end of a tube can be used to examine your colon (sigmoidoscopy or colonoscopy). This checks for the earliest forms of colorectal cancer.  Prostate and Testicular Cancer  Depending on your age and overall health, your health care provider  may do certain tests to screen for prostate and testicular cancer.  Talk to your health care provider about any symptoms or concerns you have about testicular or prostate cancer.  Skin Cancer  Check your skin from head to toe regularly.  Tell your health care provider about any new moles or changes in moles, especially if: ? There is a change in a mole's size, shape, or color. ? You have a mole that is larger than a pencil eraser.  Always use sunscreen. Apply sunscreen liberally and repeat throughout the day.  Protect  yourself by wearing long sleeves, pants, a wide-brimmed hat, and sunglasses when outside.  What should I know about heart disease, diabetes, and high blood pressure?  If you are 56-45 years of age, have your blood pressure checked every 3-5 years. If you are 71 years of age or older, have your blood pressure checked every year. You should have your blood pressure measured twice-once when you are at a hospital or clinic, and once when you are not at a hospital or clinic. Record the average of the two measurements. To check your blood pressure when you are not at a hospital or clinic, you can use: ? An automated blood pressure machine at a pharmacy. ? A home blood pressure monitor.  Talk to your health care provider about your target blood pressure.  If you are between 58-68 years old, ask your health care provider if you should take aspirin to prevent heart disease.  Have regular diabetes screenings by checking your fasting blood sugar level. ? If you are at a normal weight and have a low risk for diabetes, have this test once every three years after the age of 50. ? If you are overweight and have a high risk for diabetes, consider being tested at a younger age or more often.  A one-time screening for abdominal aortic aneurysm (AAA) by ultrasound is recommended for men aged 62-75 years who are current or former smokers. What should I know about preventing infection? Hepatitis B If you have a higher risk for hepatitis B, you should be screened for this virus. Talk with your health care provider to find out if you are at risk for hepatitis B infection. Hepatitis C Blood testing is recommended for:  Everyone born from [birth year] through 1963.  Anyone with known risk factors for hepatitis C.  Sexually Transmitted Diseases (STDs)  You should be screened each year for STDs including gonorrhea and chlamydia if: ? You are sexually active and are younger than 81 years of age. ? You are older than 81  years of age and your health care provider tells you that you are at risk for this type of infection. ? Your sexual activity has changed since you were last screened and you are at an increased risk for chlamydia or gonorrhea. Ask your health care provider if you are at risk.  Talk with your health care provider about whether you are at high risk of being infected with HIV. Your health care provider may recommend a prescription medicine to help prevent HIV infection.  What else can I do?  Schedule regular health, dental, and eye exams.  Stay current with your vaccines (immunizations).  Do not use any tobacco products, such as cigarettes, chewing tobacco, and e-cigarettes. If you need help quitting, ask your health care provider.  Limit alcohol intake to no more than 2 drinks per day. One drink equals 12 ounces of beer, 5 ounces of wine, or 1  ounces of hard liquor.  Do not use street drugs.  Do not share needles.  Ask your health care provider for help if you need support or information about quitting drugs.  Tell your health care provider if you often feel depressed.  Tell your health care provider if you have ever been abused or do not feel safe at home. This information is not intended to replace advice given to you by your health care provider. Make sure you discuss any questions you have with your health care provider. Document Released: 07/06/2007 Document Revised: 09/06/2015 Document Reviewed: 10/11/2014 Elsevier Interactive Patient Education  [employer].

## 2016-09-25 ENCOUNTER — Other Ambulatory Visit: Payer: Self-pay | Admitting: Medical

## 2016-09-30 DIAGNOSIS — G4733 Obstructive sleep apnea (adult) (pediatric): Secondary | ICD-10-CM | POA: Diagnosis not present

## 2016-09-30 DIAGNOSIS — J449 Chronic obstructive pulmonary disease, unspecified: Secondary | ICD-10-CM | POA: Diagnosis not present

## 2016-10-13 DIAGNOSIS — G4733 Obstructive sleep apnea (adult) (pediatric): Secondary | ICD-10-CM | POA: Diagnosis not present

## 2016-10-13 DIAGNOSIS — J449 Chronic obstructive pulmonary disease, unspecified: Secondary | ICD-10-CM | POA: Diagnosis not present

## 2016-10-22 ENCOUNTER — Other Ambulatory Visit: Payer: Self-pay | Admitting: Medical

## 2016-10-29 ENCOUNTER — Other Ambulatory Visit: Payer: Self-pay | Admitting: Medical

## 2016-10-30 ENCOUNTER — Ambulatory Visit: Payer: Medicare Other | Admitting: Emergency Medicine

## 2016-10-30 DIAGNOSIS — J449 Chronic obstructive pulmonary disease, unspecified: Secondary | ICD-10-CM | POA: Diagnosis not present

## 2016-10-30 DIAGNOSIS — G4733 Obstructive sleep apnea (adult) (pediatric): Secondary | ICD-10-CM | POA: Diagnosis not present

## 2016-11-04 ENCOUNTER — Ambulatory Visit (INDEPENDENT_AMBULATORY_CARE_PROVIDER_SITE_OTHER): Payer: Medicare Other | Admitting: Emergency Medicine

## 2016-11-04 ENCOUNTER — Encounter: Payer: Self-pay | Admitting: Emergency Medicine

## 2016-11-04 DIAGNOSIS — J438 Other emphysema: Secondary | ICD-10-CM | POA: Diagnosis not present

## 2016-11-04 DIAGNOSIS — G4733 Obstructive sleep apnea (adult) (pediatric): Secondary | ICD-10-CM

## 2016-11-04 DIAGNOSIS — Z23 Encounter for immunization: Secondary | ICD-10-CM | POA: Diagnosis not present

## 2016-11-04 NOTE — Assessment & Plan Note (Signed)
Significant COPD with associated hypoxemia but he has never benefited from scheduled bronchodilators. He has albuterol that he can use as needed, never uses it. He needs the flu shot today. I will not start scheduled medications today.

## 2016-11-04 NOTE — Assessment & Plan Note (Signed)
Confirmed excellent CPAP compliance today. Good clinical benefit. Continue and follow-up in one year or if he has any problems.

## 2016-11-04 NOTE — Patient Instructions (Signed)
Keep albuterol available to use 2 puffs if needed for shortness of breath, wheezing Wear your oxygen at 2 L/m with exertion and while sleeping. Continue CPAP every night Flu shot today Follow with Dr Lamonte Sakai in 12 months or sooner if you have any problems

## 2016-11-04 NOTE — Progress Notes (Signed)
Subjective:    Patient ID: Lance Powell, male    DOB: 07/16/1929, 83 y.o.   MRN: [id number]  HPI 83 year old man with a history of former tobacco use (120 pack years), COPD and obstructive sleep apnea. He's been previously followed by Dr [name]. He is on O2 2L/min continuous.   ROV 11/04/16 -- this is a follow-up visit for patient with a history of COPD, obstructive sleep apnea on CPAP. He has hypoxemic respiratory failure. Has not benefited from scheduled bronchodilators so he only uses albuterol as needed.  He is is using 2L/min with exertion. He has great compliance > download shows  Usage on 100% of the days for the last month. Average usage was 11 hours. He wore CPAP greater than 4 hours 97% of the time. He states that he has significant clinical benefit, is less sleepy, has more daytime energy, has a more restful sleep. Stiolto is on his med list - he is not taking. Does not require albuterol. He coughs up some mucous each evening, white. No other issues.    No flowsheet data found.  Review of Systems As per HPI  Past Medical History:  Diagnosis Date  . COPD (chronic obstructive pulmonary disease) ([place])   . Depression 03/08/2014  . Diabetes mellitus   . GERD (gastroesophageal reflux disease) 03/08/2014  . History of blood transfusion   . Hyperlipemia   . Hypertension   . OSA (obstructive sleep apnea)   . Osteoarthritis   . Rheumatoid arthritis(714.0)   . Sleep apnea    cpap     Family History  Problem Relation Age of Onset  . Heart disease Mother   . Emphysema Brother   . Cancer Daughter        breast     Social History   Social History  . Marital status: Married    Spouse name: N/A  . Number of children: 6  . Years of education: N/A   Occupational History  . Retired     Chief Financial Officer   Social History Main Topics  . Smoking status: Former Smoker    Packs/day: 4.00    Years: 30.00    Types: Cigarettes    Quit date: 01/21/1978  . Smokeless tobacco: Never  Used  . Alcohol use No  . Drug use: No  . Sexual activity: Not on file   Other Topics Concern  . Not on file   Social History Narrative  . 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 albuterol (PROVENTIL HFA;VENTOLIN HFA) 108 (90 Base) MCG/ACT inhaler Inhale 2 puffs into the lungs every 6 (six) hours as needed for wheezing or shortness of breath. 1 Inhaler 2  . aspirin 81 MG tablet Take 1 tablet (81 mg total) by mouth every morning. Stop for 1 week and then resume (Patient taking differently: Take 81 mg by mouth every morning. )    . atorvastatin (LIPITOR) 40 MG tablet TAKE 1 TABLET BY MOUTH ONCE DAILY IN THE MORNING 90 tablet 0  . canagliflozin (INVOKANA) 100 MG TABS tablet Take 1 tablet (100 mg total) by mouth daily before breakfast. 30 tablet 3  . celecoxib (CELEBREX) 200 MG capsule TAKE 1 CAPSULE BY MOUTH ONCE DAILY IN THE EVENING 30 capsule 1  . cholecalciferol (VITAMIN D) 1000 UNITS tablet Take  2,000 Units by mouth 2 (two) times daily.     . clonazePAM (KLONOPIN) 0.25 MG disintegrating tablet 1 tab po q hs prn insomnia or anxiety 10 tablet 2  . enalapril (VASOTEC) 2.5 MG tablet Take 2.5 mg by mouth every morning. Reported on 07/12/2015    . glucose blood (BAYER CONTOUR TEST) test strip Use as instructed to check blood sugar twice a day.  DX  E11.9 100 each 5  . Indacaterol Maleate (ARCAPTA NEOHALER) 75 MCG CAPS Place 1 capsule into inhaler and inhale every morning. 90 capsule 1  . ketoconazole (NIZORAL) 2 % cream As  Directed.  0  . ketoconazole (NIZORAL) 2 % shampoo As directed.  0  . lidocaine (LIDODERM) 5 % Place 1 patch onto the skin daily. Remove & Discard patch within 12 hours or as directed by MD 30 patch 0  . meclizine (ANTIVERT) 12.5 MG tablet TAKE 1 TABLET BY MOUTH THREE TIMES DAILY AS NEEDED FOR  DIZZINESS 30 tablet 0  . metFORMIN (GLUCOPHAGE) 500 MG tablet TAKE 1 TABLET BY MOUTH THREE TIMES DAILY 90 tablet 1  . misoprostol (CYTOTEC) 100 MCG tablet TAKE ONE TABLET BY MOUTH ONCE DAILY IN THE MORNING, ONE TABLET ONCE DAILY IN THE EVENING, AND TWO TABLETS ONCE DAILY AT BEDTIME 120 tablet 2  . misoprostol (CYTOTEC) 100 MCG tablet TAKE 1 TABLET BY MOUTH ONCE DAILY IN THE MORNING AND 1 ONCE DAILY IN THE EVENING AND 2 ONCE DAILY AT BEDTIME 120 tablet 1  . Multiple Vitamins-Minerals (CENTRUM) tablet Take 1 tablet by mouth every morning. Reported on 01/05/2015    . OVER THE COUNTER MEDICATION Place 1-2 drops into both eyes daily as needed (dry red eyes.). Reported on 01/05/2015    . Tiotropium Bromide-Olodaterol (STIOLTO RESPIMAT) 2.5-2.5 MCG/ACT AERS Inhale 2 puffs into the lungs daily. 4 g 5  . tobramycin (TOBREX) 0.3 % ophthalmic solution Place 2 drops into the left eye every 6 (six) hours. 5 mL 0  . UNABLE TO FIND CPAP    . venlafaxine XR (EFFEXOR-XR) 37.5 MG 24 hr capsule TAKE 1 CAPSULE BY MOUTH ONCE DAILY WITH BREAKFAST 90 capsule 0  . vitamin C (ASCORBIC ACID) 500 MG tablet Take 500 mg by mouth every morning.      No facility-administered medications prior to visit.          Objective:   Physical Exam Vitals:   11/04/16 1646  BP: 130/80  Pulse: (!) 114  SpO2: 94%  Weight: 240 lb 9.6 oz (109.1 kg)  Height: 6' (1.829 m)   Gen: Pleasant, overwt man, in no distress,  normal affect  ENT: No lesions,  mouth clear,  oropharynx clear, no postnasal drip  Neck: No JVD, no TMG, no carotid bruits  Lungs: No use of accessory muscles,  clear without rales or rhonchi  Cardiovascular: RRR, heart sounds normal, no murmur or gallops, no peripheral edema  Musculoskeletal: No deformities, no cyanosis or clubbing  Neuro: alert, non focal  Skin: Warm, no lesions or rashes      Assessment & Plan:  COPD (chronic obstructive pulmonary disease) Significant COPD with associated hypoxemia  but he has never benefited from scheduled bronchodilators. He has albuterol that he can use as needed, never uses it. He needs the flu shot today. I will not start scheduled medications today.  Obstructive sleep apnea Confirmed excellent CPAP compliance today. Good clinical benefit. Continue and follow-up in one year or if he has any problems.  [name], MD, PhD 11/04/2016,  5:08 PM [facility] [phone] or if no answer [phone]

## 2016-11-30 DIAGNOSIS — J449 Chronic obstructive pulmonary disease, unspecified: Secondary | ICD-10-CM | POA: Diagnosis not present

## 2016-11-30 DIAGNOSIS — G4733 Obstructive sleep apnea (adult) (pediatric): Secondary | ICD-10-CM | POA: Diagnosis not present

## 2016-12-04 ENCOUNTER — Ambulatory Visit: Payer: Medicare Other | Admitting: Medical

## 2016-12-05 ENCOUNTER — Encounter: Payer: Self-pay | Admitting: Medical

## 2016-12-05 ENCOUNTER — Ambulatory Visit: Payer: Medicare Other | Admitting: Medical

## 2016-12-05 VITALS — BP 113/78 | HR 114 | Temp 97.7°F | Resp 18 | Wt 239.0 lb

## 2016-12-05 DIAGNOSIS — E118 Type 2 diabetes mellitus with unspecified complications: Secondary | ICD-10-CM | POA: Diagnosis not present

## 2016-12-05 DIAGNOSIS — Z794 Long term (current) use of insulin: Secondary | ICD-10-CM

## 2016-12-05 DIAGNOSIS — G629 Polyneuropathy, unspecified: Secondary | ICD-10-CM | POA: Diagnosis not present

## 2016-12-05 NOTE — Progress Notes (Signed)
Subjective:    Patient ID: Lance Powell, male    DOB: 1929-05-31, 83 y.o.   MRN: [id number]  HPI   Pt in for follow up.  Pt states he is not taking invokona. He states med is over $100 dollars and his sugars are controlled without edema,. Pt state sugar checks show sugars less than 125. Only one 160. These are without invokana for 3 weeks. Pt is still on metformin.  Pt last a1c was 6.5  Pt states some left foot tingling that occurs intermittently about 1 time a day. No motor issues. No rt side tingling.  No reported back pain.    Review of Systems  Constitutional: Negative for chills, fatigue and fever.  HENT: Negative for congestion, ear discharge and ear pain.   Respiratory: Negative for cough, chest tightness, shortness of breath and wheezing.   Cardiovascular: Negative for chest pain and palpitations.  Gastrointestinal: Negative for abdominal distention, abdominal pain, blood in stool, constipation, nausea and vomiting.  Musculoskeletal: Negative for back pain, myalgias and neck pain.  Skin: Negative for rash.  Neurological: Negative for dizziness, speech difficulty, weakness, numbness and headaches.       Neuropathy.  Hematological: Negative for adenopathy. Does not bruise/bleed easily.  Psychiatric/Behavioral: Negative for behavioral problems, confusion and suicidal ideas. The patient is not nervous/anxious.     Past Medical History:  Diagnosis Date  . COPD (chronic obstructive pulmonary disease) ([place])   . Depression 03/08/2014  . Diabetes mellitus   . GERD (gastroesophageal reflux disease) 03/08/2014  . History of blood transfusion   . Hyperlipemia   . Hypertension   . OSA (obstructive sleep apnea)   . Osteoarthritis   . Rheumatoid arthritis(714.0)   . Sleep apnea    cpap     Social History   Socioeconomic History  . Marital status: Married    Spouse name: Not on file  . Number of children: 6  . Years of education: Not on file  . Highest education  level: Not on file  Social Needs  . Financial resource strain: Not on file  . Food insecurity - worry: Not on file  . Food insecurity - inability: Not on file  . Transportation needs - medical: Not on file  . Transportation needs - non-medical: Not on file  Occupational History  . Occupation: Retired    Comment: Print production planner  . Smoking status: Former Smoker    Packs/day: 4.00    Years: 30.00    Pack years: 120.00    Types: Cigarettes    Last attempt to quit: 01/21/1978    Years since quitting: 38.8  . Smokeless tobacco: Never Used  Substance and Sexual Activity  . Alcohol use: No  . Drug use: No  . Sexual activity: Not on file  Other Topics Concern  . Not on file  Social History Narrative  . Not on file    Past Surgical History:  Procedure Laterality Date  . APPENDECTOMY  1969  . CHOLECYSTECTOMY N/A 06/16/2014   Procedure: LAPAROSCOPIC CHOLECYSTECTOMY;  Surgeon: Greer Pickerel, MD;  Location: [facility];  Service: General;  Laterality: N/A;  . CYSTOSCOPY  01/23/2011   Procedure: CYSTOSCOPY;  Surgeon: Molli Hazard, MD;  Location: [facility];  Service: Urology;  Laterality: N/A;  . FLEXIBLE SIGMOIDOSCOPY  01/07/2012   Procedure: FLEXIBLE SIGMOIDOSCOPY;  Surgeon: Ladene Artist, MD,FACG;  Location: [facility];  Service: Endoscopy;  Laterality: N/A;  . [place]  right and left inguinal  . NISSEN FUNDOPLICATION    . 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 TONSILLECTOMY  1937  . TRANSURETHRAL RESECTION OF PROSTATE  01/23/2011   Procedure: TRANSURETHRAL RESECTION OF THE PROSTATE WITH GYRUS INSTRUMENTS;  Surgeon: Molli Hazard, MD;  Location[facility];  Service: Urology;  Laterality: N/A;  . VASECTOMY  1972    Family History  Problem Relation Age of Onset  . Heart disease Mother   . Emphysema Brother   . Cancer Daughter        breast    No Known Allergies  Current Outpatient Medications on File Prior  to Visit  Medication Sig Dispense Refill  . albuterol (PROVENTIL HFA;VENTOLIN HFA) 108 (90 Base) MCG/ACT inhaler Inhale 2 puffs into the lungs every 6 (six) hours as needed for wheezing or shortness of breath. 1 Inhaler 2  . aspirin 81 MG tablet Take 1 tablet (81 mg total) by mouth every morning. Stop for 1 week and then resume (Patient taking differently: Take 81 mg by mouth every morning. )    . atorvastatin (LIPITOR) 40 MG tablet TAKE 1 TABLET BY MOUTH ONCE DAILY IN THE MORNING 90 tablet 0  . celecoxib (CELEBREX) 200 MG capsule TAKE 1 CAPSULE BY MOUTH ONCE DAILY IN THE EVENING 30 capsule 1  . cholecalciferol (VITAMIN D) 1000 UNITS tablet Take 2,000 Units by mouth 2 (two) times daily.     . clonazePAM (KLONOPIN) 0.25 MG disintegrating tablet 1 tab po q hs prn insomnia or anxiety 10 tablet 2  . enalapril (VASOTEC) 2.5 MG tablet Take 2.5 mg by mouth every morning. Reported on 07/12/2015    . glucose blood (BAYER CONTOUR TEST) test strip Use as instructed to check blood sugar twice a day.  DX  E11.9 100 each 5  . Indacaterol Maleate (ARCAPTA NEOHALER) 75 MCG CAPS Place 1 capsule into inhaler and inhale every morning. 90 capsule 1  . ketoconazole (NIZORAL) 2 % cream As  Directed.  0  . ketoconazole (NIZORAL) 2 % shampoo As directed.  0  . lidocaine (LIDODERM) 5 % Place 1 patch onto the skin daily. Remove & Discard patch within 12 hours or as directed by MD 30 patch 0  . meclizine (ANTIVERT) 12.5 MG tablet TAKE 1 TABLET BY MOUTH THREE TIMES DAILY AS NEEDED FOR DIZZINESS 30 tablet 0  . metFORMIN (GLUCOPHAGE) 500 MG tablet TAKE 1 TABLET BY MOUTH THREE TIMES DAILY 90 tablet 1  . misoprostol (CYTOTEC) 100 MCG tablet TAKE ONE TABLET BY MOUTH ONCE DAILY IN THE MORNING, ONE TABLET ONCE DAILY IN THE EVENING, AND TWO TABLETS ONCE DAILY AT BEDTIME 120 tablet 2  . Multiple Vitamins-Minerals (CENTRUM) tablet Take 1 tablet by mouth every morning. Reported on 01/05/2015    . OVER THE COUNTER MEDICATION Place 1-2  drops into both eyes daily as needed (dry red eyes.). Reported on 01/05/2015    . Tiotropium Bromide-Olodaterol (STIOLTO RESPIMAT) 2.5-2.5 MCG/ACT AERS Inhale 2 puffs into the lungs daily. 4 g 5  . tobramycin (TOBREX) 0.3 % ophthalmic solution Place 2 drops into the left eye every 6 (six) hours. 5 mL 0  . UNABLE TO FIND CPAP    . venlafaxine XR (EFFEXOR-XR) 37.5 MG 24 hr capsule TAKE 1 CAPSULE BY MOUTH ONCE DAILY WITH BREAKFAST 90 capsule 0  . vitamin C (ASCORBIC ACID) 500 MG tablet Take 500 mg by mouth every morning.     . canagliflozin (INVOKANA) 100 MG TABS tablet  Take 1 tablet (100 mg total) by mouth daily before breakfast. (Patient not taking: Reported on 12/05/2016) 30 tablet 3   No current facility-administered medications on file prior to visit.     BP 113/78   Pulse (!) 125   Temp 97.7 F (36.5 C) (Oral)   Resp 18   Wt 239 lb (108.4 kg)   SpO2 95%   BMI 32.41 kg/m       Objective:   Physical Exam   General Mental Status- Alert. General Appearance- Not in acute distress.   Skin General: Color- Normal Color. Moisture- Normal Moisture.  Neck Carotid Arteries- Normal color. Moisture- Normal Moisture. No carotid bruits. No JVD.  Chest and Lung Exam Auscultation: Breath Sounds:-Normal.  Cardiovascular Auscultation:Rythm- Regular. Murmurs & Other Heart Sounds:Auscultation of the heart reveals- No Murmurs.  Abdomen Inspection:-Inspeection Normal. Palpation/Percussion:Note:No mass. Palpation and Percussion of the abdomen reveal- Non Tender, Non Distended + BS, no rebound or guarding.   Neurologic Cranial Nerve exam:- CN III-XII intact(No nystagmus), symmetric smile. Strength:- 5/5 equal and symmetric strength both upper and lower extremities.  Lower extremity-see quality metrics.  Left foot normal motor function.  Pulses intact.  Good capillary refill.  Normal sensation.  No swelling or warmth.     Assessment & Plan:  For your diabetes which is well controlled  recently, I want you to continue the metformin but stop the Invokana.  Your blood sugars without Invokana have been very well controlled.  Keep checking your sugars but if your sugars start to peak towards 180 or higher than we might need to make adjustments to your regimen again.  Continue to eat a healthy diet.  You appear to have some possible mild early diabetic neuropathic type pain on your left foot.  If this becomes more chronic then we can give you medication for diabetic neuropathy.    I have put in future labs to be done first week of December.  This will be metabolic panel and D3T.  Follow-up date will be 3 months or as needed.  Loreda Silverio, [name], PA-C

## 2016-12-05 NOTE — Patient Instructions (Signed)
For your diabetes which is well controlled recently, I want you to continue the metformin but stop the Invokana.  Your blood sugars without Invokana have been very well controlled.  Keep checking your sugars but if your sugars start to peak towards 180 or higher than we might need to make adjustments to your regimen again.  Continue to eat a healthy diet.  You appear to have some possible mild early diabetic neuropathic type pain on your left foot.  If this becomes more chronic then we can give you medication for diabetic neuropathy.    I have put in future labs to be done first week of December.  This will be metabolic panel and H1I.  Follow-up date will be 3 months or as needed.

## 2016-12-25 ENCOUNTER — Other Ambulatory Visit (INDEPENDENT_AMBULATORY_CARE_PROVIDER_SITE_OTHER): Payer: Medicare Other

## 2016-12-25 DIAGNOSIS — Z794 Long term (current) use of insulin: Secondary | ICD-10-CM

## 2016-12-25 DIAGNOSIS — E118 Type 2 diabetes mellitus with unspecified complications: Secondary | ICD-10-CM

## 2016-12-26 DIAGNOSIS — Z7984 Long term (current) use of oral hypoglycemic drugs: Secondary | ICD-10-CM | POA: Diagnosis not present

## 2016-12-26 DIAGNOSIS — H2513 Age-related nuclear cataract, bilateral: Secondary | ICD-10-CM | POA: Diagnosis not present

## 2016-12-26 DIAGNOSIS — E119 Type 2 diabetes mellitus without complications: Secondary | ICD-10-CM | POA: Diagnosis not present

## 2016-12-26 DIAGNOSIS — H524 Presbyopia: Secondary | ICD-10-CM | POA: Diagnosis not present

## 2016-12-26 LAB — COMPREHENSIVE METABOLIC PANEL
ALBUMIN: 4.2 g/dL (ref 3.5–5.2)
ALK PHOS: 82 U/L (ref 39–117)
ALT: 20 U/L (ref 0–53)
AST: 15 U/L (ref 0–37)
BILIRUBIN TOTAL: 0.5 mg/dL (ref 0.2–1.2)
BUN: 24 mg/dL — AB (ref 6–23)
CALCIUM: 10.6 mg/dL — AB (ref 8.4–10.5)
CO2: 36 mEq/L — ABNORMAL HIGH (ref 19–32)
CREATININE: 1.13 mg/dL (ref 0.40–1.50)
Chloride: 101 mEq/L (ref 96–112)
GFR: 65.52 mL/min (ref >60.00)
GLUCOSE: 183 mg/dL — AB (ref 70–99)
POTASSIUM: 4.9 meq/L (ref 3.5–5.1)
Sodium: 141 mEq/L (ref 135–145)
TOTAL PROTEIN: 6.8 g/dL (ref 6.0–8.3)

## 2016-12-26 LAB — HEMOGLOBIN A1C: Hgb A1c MFr Bld: 6.8 % — ABNORMAL HIGH (ref 4.6–6.5)

## 2016-12-30 ENCOUNTER — Other Ambulatory Visit: Payer: Self-pay | Admitting: Medical

## 2016-12-30 DIAGNOSIS — J449 Chronic obstructive pulmonary disease, unspecified: Secondary | ICD-10-CM | POA: Diagnosis not present

## 2016-12-30 DIAGNOSIS — G4733 Obstructive sleep apnea (adult) (pediatric): Secondary | ICD-10-CM | POA: Diagnosis not present

## 2017-01-01 NOTE — Telephone Encounter (Signed)
Patient in the past had a form for [employer] which I filled out.  It basically was describing the importance of him not losing his power.  I cannot remember all the details but I did fill it out since he does have medical problems that require his electricity to be working.  He sent message stating he needed the form renewed.  I am unaware that we have the form in our office.  We might need him to drop that off or fax it.  Would you call him and ask him directly what is going on.

## 2017-01-02 ENCOUNTER — Telehealth: Payer: Self-pay

## 2017-01-02 NOTE — Telephone Encounter (Signed)
Copied from [place] [phone]. Topic: General - Other >> Dec 31, 2016  1:18 PM Scherrie Gerlach wrote: Reason for CRM: pt states please return paperwork to [employer] concerning his constant 24 hr/day oxygenator and cpap machine request Pt states this is a renewal form and [name] is aware.  But [employer] has not received yet

## 2017-01-02 NOTE — Telephone Encounter (Signed)
As I mentioned, I am not aware where the form is.  We might have an old form in media section of epic that you could print.  I could mark out the date initial it and place todays date.  Then re-sign.  that might work.  We would still need a number to fax the form to.  Other than that we would have to wait to speak with Ivin Booty.  You could look in my folders and see if form is in there.

## 2017-01-02 NOTE — Telephone Encounter (Signed)
Called Pt to ask him if he has a form to bring in to be completed. Someone picked up the phone but said nothing. I called abck again with no response. Nurse or myself will try call again later this evening requesting [employer] forms.

## 2017-01-03 ENCOUNTER — Telehealth: Payer: Self-pay | Admitting: Medical

## 2017-01-03 NOTE — Telephone Encounter (Signed)
Copied from [place] [phone]. Topic: General - Other >> Dec 31, 2016  1:18 PM Scherrie Gerlach wrote: Reason for CRM: pt states please return paperwork to [employer] concerning his constant 24 hr/day oxygenator and cpap machine request Pt states this is a renewal form and Anaiz Qazi is aware.  But [employer] has not received yet

## 2017-01-03 NOTE — Telephone Encounter (Signed)
Still need form to fill out. See prior notes sent.

## 2017-01-09 ENCOUNTER — Telehealth: Payer: Self-pay | Admitting: Medical

## 2017-01-09 NOTE — Telephone Encounter (Signed)
Copied from [place] [phone]. Topic: Quick Communication - See Telephone Encounter >> Jan 09, 2017  1:43 PM Rosalin Hawking wrote: CRM for notification. See Telephone encounter for:  01/09/17. Pharmacy: [facility], [place]. Pt tel [phone]   Pt requesting refill on glucose blood (BAYER CONTOUR TEST) test strip. Please advise.

## 2017-01-10 DIAGNOSIS — E119 Type 2 diabetes mellitus without complications: Secondary | ICD-10-CM | POA: Diagnosis not present

## 2017-01-10 MED ORDER — GLUCOSE BLOOD VI STRP: ORAL_STRIP | 5 refills | Status: DC

## 2017-01-10 NOTE — Telephone Encounter (Signed)
[place], [place] [phone] (Phone) [phone] (Fax)   Pharmacy would like clarity regarding test strips prescribe. Please confirm if patient has a meter and if so requesting meter Rx and if patient does have meter what kind of meter, please advise

## 2017-01-10 NOTE — Telephone Encounter (Signed)
Test strips sent to pharmacy.

## 2017-01-10 NOTE — Addendum Note (Signed)
Addended by: Hinton Dyer on: 01/10/2017 03:52 PM   Modules accepted: Orders

## 2017-01-15 NOTE — Telephone Encounter (Signed)
Talked to pt. Advised to bring new forms.

## 2017-01-17 ENCOUNTER — Telehealth: Payer: Self-pay | Admitting: Medical

## 2017-01-17 NOTE — Telephone Encounter (Signed)
Patient came in to have medical alert documents to [facility] [employer]. Faxed over & received confirmation. Documents placed in bin for scanning review.

## 2017-01-19 ENCOUNTER — Other Ambulatory Visit: Payer: Self-pay | Admitting: Medical

## 2017-01-30 DIAGNOSIS — G4733 Obstructive sleep apnea (adult) (pediatric): Secondary | ICD-10-CM | POA: Diagnosis not present

## 2017-01-30 DIAGNOSIS — J449 Chronic obstructive pulmonary disease, unspecified: Secondary | ICD-10-CM | POA: Diagnosis not present

## 2017-02-27 ENCOUNTER — Other Ambulatory Visit: Payer: Self-pay | Admitting: Medical

## 2017-03-12 ENCOUNTER — Ambulatory Visit: Payer: Medicare Other | Admitting: Medical

## 2017-03-13 ENCOUNTER — Ambulatory Visit (INDEPENDENT_AMBULATORY_CARE_PROVIDER_SITE_OTHER): Payer: Medicare Other | Admitting: Medical

## 2017-03-13 ENCOUNTER — Encounter: Payer: Self-pay | Admitting: Medical

## 2017-03-13 VITALS — BP 122/61 | HR 117 | Temp 98.0°F | Resp 16 | Ht 72.0 in | Wt 243.0 lb

## 2017-03-13 DIAGNOSIS — E785 Hyperlipidemia, unspecified: Secondary | ICD-10-CM | POA: Diagnosis not present

## 2017-03-13 DIAGNOSIS — E118 Type 2 diabetes mellitus with unspecified complications: Secondary | ICD-10-CM

## 2017-03-13 DIAGNOSIS — R42 Dizziness and giddiness: Secondary | ICD-10-CM | POA: Diagnosis not present

## 2017-03-13 DIAGNOSIS — J449 Chronic obstructive pulmonary disease, unspecified: Secondary | ICD-10-CM

## 2017-03-13 DIAGNOSIS — Z794 Long term (current) use of insulin: Secondary | ICD-10-CM | POA: Diagnosis not present

## 2017-03-13 MED ORDER — MECLIZINE HCL 12.5 MG PO TABS: 12.5000 mg | ORAL_TABLET | Freq: Three times a day (TID) | ORAL | 0 refills | Status: DC | PRN

## 2017-03-13 NOTE — Progress Notes (Signed)
Subjective:    Patient ID: Lance Powell, male    DOB: Mar 07, 1929, 84 y.o.   MRN: [id number]  HPI  Pt in for follow up.   He states he feels good today.  Pt a1c 2 months ago ws 6.8. Pt sugars have been overall good. He bring in readings today.Low sugar 102. High was 190. Majority seem in 130-140.  Pt did have 4 days of dizziness in a row about 2 weeks ago.  Dizziness went away 7 days ago. Has not returned. No gross motor or sensory function deficits. Feels good today.  Pt still using his oxygen daily. Has hx of copd.  Pt had his flu vaccine.    Review of Systems  Constitutional: Negative for chills, fatigue and fever.  Respiratory: Negative for cough, chest tightness, shortness of breath and wheezing.   Cardiovascular: Negative for chest pain and palpitations.  Gastrointestinal: Negative for abdominal distention, abdominal pain, diarrhea, nausea and rectal pain.  Genitourinary: Negative for dysuria, enuresis and frequency.  Musculoskeletal: Negative for back pain and neck stiffness.  Skin: Negative for rash.  Neurological: Negative for dizziness, seizures, weakness, light-headedness, numbness and headaches.       See hpi.  Hematological: Negative for adenopathy. Does not bruise/bleed easily.  Psychiatric/Behavioral: Negative for behavioral problems, confusion and sleep disturbance. The patient is not nervous/anxious and is not hyperactive.     Past Medical History:  Diagnosis Date  . COPD (chronic obstructive pulmonary disease) ([place])   . Depression 03/08/2014  . Diabetes mellitus   . GERD (gastroesophageal reflux disease) 03/08/2014  . History of blood transfusion   . Hyperlipemia   . Hypertension   . OSA (obstructive sleep apnea)   . Osteoarthritis   . Rheumatoid arthritis(714.0)   . Sleep apnea    cpap     Social History   Socioeconomic History  . Marital status: Married    Spouse name: Not on file  . Number of children: 6  . Years of education: Not on  file  . Highest education level: Not on file  Social Needs  . Financial resource strain: Not on file  . Food insecurity - worry: Not on file  . Food insecurity - inability: Not on file  . Transportation needs - medical: Not on file  . Transportation needs - non-medical: Not on file  Occupational History  . Occupation: Retired    Comment: Print production planner  . Smoking status: Former Smoker    Packs/day: 4.00    Years: 30.00    Pack years: 120.00    Types: Cigarettes    Last attempt to quit: 01/21/1978    Years since quitting: 39.1  . Smokeless tobacco: Never Used  Substance and Sexual Activity  . Alcohol use: No  . Drug use: No  . Sexual activity: Not on file  Other Topics Concern  . Not on file  Social History Narrative  . Not on file    Past Surgical History:  Procedure Laterality Date  . APPENDECTOMY  1969  . CHOLECYSTECTOMY N/A 06/16/2014   Procedure: LAPAROSCOPIC CHOLECYSTECTOMY;  Surgeon: Greer Pickerel, MD;  Location: [facility];  Service: General;  Laterality: N/A;  . CYSTOSCOPY  01/23/2011   Procedure: CYSTOSCOPY;  Surgeon: Molli Hazard, MD;  Location[facility];  Service: Urology;  Laterality: N/A;  . FLEXIBLE SIGMOIDOSCOPY  01/07/2012   Procedure: FLEXIBLE SIGMOIDOSCOPY;  Surgeon: Ladene Artist, MD,FACG;  Location: [facility];  Service: Endoscopy;  Laterality: N/A;  .  HERNIA REPAIR  1990   right and left inguinal  . NISSEN FUNDOPLICATION    . 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 TONSILLECTOMY  1937  . TRANSURETHRAL RESECTION OF PROSTATE  01/23/2011   Procedure: TRANSURETHRAL RESECTION OF THE PROSTATE WITH GYRUS INSTRUMENTS;  Surgeon: Molli Hazard, MD;  Location[facility];  Service: Urology;  Laterality: N/A;  . VASECTOMY  1972    Family History  Problem Relation Age of Onset  . Heart disease Mother   . Emphysema Brother   . Cancer Daughter        breast    No Known Allergies  Current Outpatient  Medications on File Prior to Visit  Medication Sig Dispense Refill  . albuterol (PROVENTIL HFA;VENTOLIN HFA) 108 (90 Base) MCG/ACT inhaler Inhale 2 puffs into the lungs every 6 (six) hours as needed for wheezing or shortness of breath. 1 Inhaler 2  . aspirin 81 MG tablet Take 1 tablet (81 mg total) by mouth every morning. Stop for 1 week and then resume (Patient taking differently: Take 81 mg by mouth every morning. )    . atorvastatin (LIPITOR) 40 MG tablet TAKE 1 TABLET BY MOUTH ONCE DAILY IN THE MORNING 90 tablet 0  . canagliflozin (INVOKANA) 100 MG TABS tablet Take 1 tablet (100 mg total) by mouth daily before breakfast. 30 tablet 3  . celecoxib (CELEBREX) 200 MG capsule TAKE 1 CAPSULE BY MOUTH ONCE DAILY IN THE EVENING 30 capsule 1  . cholecalciferol (VITAMIN D) 1000 UNITS tablet Take 2,000 Units by mouth 2 (two) times daily.     . clonazePAM (KLONOPIN) 0.25 MG disintegrating tablet 1 tab po q hs prn insomnia or anxiety 10 tablet 2  . enalapril (VASOTEC) 2.5 MG tablet Take 2.5 mg by mouth every morning. Reported on 07/12/2015    . glucose blood (BAYER CONTOUR TEST) test strip Use as instructed to check blood sugar twice a day.  DX  E11.9 100 each 5  . Indacaterol Maleate (ARCAPTA NEOHALER) 75 MCG CAPS Place 1 capsule into inhaler and inhale every morning. 90 capsule 1  . ketoconazole (NIZORAL) 2 % cream As  Directed.  0  . ketoconazole (NIZORAL) 2 % shampoo As directed.  0  . lidocaine (LIDODERM) 5 % Place 1 patch onto the skin daily. Remove & Discard patch within 12 hours or as directed by MD 30 patch 0  . meclizine (ANTIVERT) 12.5 MG tablet TAKE 1 TABLET BY MOUTH THREE TIMES DAILY AS NEEDED FOR DIZZINESS 30 tablet 0  . metFORMIN (GLUCOPHAGE) 500 MG tablet TAKE 1 TABLET BY MOUTH THREE TIMES DAILY 90 tablet 1  . misoprostol (CYTOTEC) 100 MCG tablet TAKE ONE TABLET BY MOUTH ONCE DAILY IN THE MORNING, ONE TABLET ONCE DAILY IN THE EVENING, AND TWO TABLETS ONCE DAILY AT BEDTIME 120 tablet 2  .  misoprostol (CYTOTEC) 100 MCG tablet TAKE 1 TABLET BY MOUTH  IN THE MORNING AND THEN TAKE 1 TABLET IN THE EVENING AND TAKE 2 TABLETS AT BEDTIME 120 tablet 1  . Multiple Vitamins-Minerals (CENTRUM) tablet Take 1 tablet by mouth every morning. Reported on 01/05/2015    . OVER THE COUNTER MEDICATION Place 1-2 drops into both eyes daily as needed (dry red eyes.). Reported on 01/05/2015    . Tiotropium Bromide-Olodaterol (STIOLTO RESPIMAT) 2.5-2.5 MCG/ACT AERS Inhale 2 puffs into the lungs daily. 4 g 5  . tobramycin (TOBREX) 0.3 % ophthalmic solution Place 2 drops into the left eye every 6 (six)  hours. 5 mL 0  . UNABLE TO FIND CPAP    . venlafaxine XR (EFFEXOR-XR) 37.5 MG 24 hr capsule TAKE 1 CAPSULE BY MOUTH ONCE DAILY WITH BREAKFAST 90 capsule 0  . vitamin C (ASCORBIC ACID) 500 MG tablet Take 500 mg by mouth every morning.      No current facility-administered medications on file prior to visit.     BP 122/61   Pulse (!) 117   Temp 98 F (36.7 C) (Oral)   Resp 16   Ht 6' (1.829 m)   Wt 243 lb (110.2 kg)   SpO2 95%   BMI 32.96 kg/m       Objective:   Physical Exam  General Mental Status- Alert. General Appearance- Not in acute distress.   Skin General: Color- Normal Color. Moisture- Normal Moisture.  Neck Carotid Arteries- Normal color. Moisture- Normal Moisture. No carotid bruits. No JVD.  Chest and Lung Exam Auscultation: Breath Sounds:-Normal.  Cardiovascular Auscultation:Rythm- Regular. Murmurs & Other Heart Sounds:Auscultation of the heart reveals- No Murmurs.  Abdomen Inspection:-Inspeection Normal. Palpation/Percussion:Note:No mass. Palpation and Percussion of the abdomen reveal- Non Tender, Non Distended + BS, no rebound or guarding. Still has the abdominal hernia.  Nontender to palpation.  Neurologic Cranial Nerve exam:- CN III-XII intact(No nystagmus), symmetric smile. Strength:- 5/5 equal and symmetric strength both upper and lower  extremities.  Lower extremity-see quality metrics.  Left foot normal motor function.  Pulses intact.  Good capillary refill.  Normal sensation.  No swelling or warmth.      Assessment & Plan:  Your blood sugar readings have been overall good on review of your glucometer.  Continue current medications and will get A1c on March 6th, seventh or eighth.  Please schedule lab appointment on the way out.  Your dizziness was present 2 weeks ago.  It has been resolved for 7 days and no recent neurologic signs or symptoms.  If you do start to get dizziness again would recommend you check your blood pressure and sugar to make sure no abnormal high or low readings causing the dizziness.  If you have dizziness with associated neurologic signs or symptoms then be seen in the emergency department.  Also if dizziness returns could use meclizine over-the-counter for severe type dizziness lasting more than 5 minutes.  We will check your cholesterol, CBC and metabolic panel when we check your A1c.  Follow-up date to be determined after lab review.  [name], PA-C

## 2017-03-13 NOTE — Patient Instructions (Addendum)
Your blood sugar readings have been overall good on review of your glucometer.  Continue current medications and will get A1c on March 6th, seventh or eighth.  Please schedule lab appointment on the way out.  Your dizziness was present 2 weeks ago.  It has been resolved now for 7 days and no recent neurologic signs or symptoms.  If you do start to get dizziness again would recommend you check your blood pressure and sugar to make sure no abnormal high or low readings causing the dizziness.  If you have dizziness with associated neurologic signs or symptoms then be seen in the emergency department.  Also if dizziness returns could use meclizine over-the-counter for severe type dizziness lasting more than 5 minutes.  We will check your cholesterol, CBC and metabolic panel when we check your A1c.  Follow-up date to be determined after lab review.

## 2017-03-26 ENCOUNTER — Other Ambulatory Visit (INDEPENDENT_AMBULATORY_CARE_PROVIDER_SITE_OTHER): Payer: Medicare Other

## 2017-03-26 ENCOUNTER — Other Ambulatory Visit: Payer: Medicare Other

## 2017-03-26 DIAGNOSIS — E785 Hyperlipidemia, unspecified: Secondary | ICD-10-CM | POA: Diagnosis not present

## 2017-03-26 DIAGNOSIS — E118 Type 2 diabetes mellitus with unspecified complications: Secondary | ICD-10-CM

## 2017-03-26 DIAGNOSIS — R42 Dizziness and giddiness: Secondary | ICD-10-CM

## 2017-03-26 DIAGNOSIS — Z794 Long term (current) use of insulin: Secondary | ICD-10-CM | POA: Diagnosis not present

## 2017-03-26 LAB — CBC WITH DIFFERENTIAL/PLATELET
BASOS ABS: 0.1 10*3/uL (ref 0.0–0.1)
Basophils Relative: 0.5 % (ref 0.0–3.0)
EOS PCT: 2.8 % (ref 0.0–5.0)
Eosinophils Absolute: 0.3 10*3/uL (ref 0.0–0.7)
HEMATOCRIT: 47.9 % (ref 39.0–52.0)
Hemoglobin: 15.7 g/dL (ref 13.0–17.0)
LYMPHS ABS: 3.8 10*3/uL (ref 0.7–4.0)
LYMPHS PCT: 36.7 % (ref 12.0–46.0)
MCHC: 32.7 g/dL (ref 30.0–36.0)
MCV: 97.3 fl (ref 78.0–100.0)
MONOS PCT: 9 % (ref 3.0–12.0)
Monocytes Absolute: 0.9 10*3/uL (ref 0.1–1.0)
NEUTROS PCT: 51 % (ref 43.0–77.0)
Neutro Abs: 5.3 10*3/uL (ref 1.4–7.7)
Platelets: 207 10*3/uL (ref 150.0–400.0)
RBC: 4.93 Mil/uL (ref 4.22–5.81)
RDW: 14 % (ref 11.5–15.5)
WBC: 10.4 10*3/uL (ref 4.0–10.5)

## 2017-03-26 LAB — COMPREHENSIVE METABOLIC PANEL
ALK PHOS: 75 U/L (ref 39–117)
ALT: 23 U/L (ref 0–53)
AST: 17 U/L (ref 0–37)
Albumin: 4.1 g/dL (ref 3.5–5.2)
BILIRUBIN TOTAL: 0.6 mg/dL (ref 0.2–1.2)
BUN: 17 mg/dL (ref 6–23)
CALCIUM: 10.7 mg/dL — AB (ref 8.4–10.5)
CO2: 32 mEq/L (ref 19–32)
Chloride: 102 mEq/L (ref 96–112)
Creatinine, Ser: 1.2 mg/dL (ref 0.40–1.50)
GFR: 61.09 mL/min (ref >60.00)
GLUCOSE: 155 mg/dL — AB (ref 70–99)
POTASSIUM: 4.6 meq/L (ref 3.5–5.1)
Sodium: 140 mEq/L (ref 135–145)
Total Protein: 7.1 g/dL (ref 6.0–8.3)

## 2017-03-26 LAB — LIPID PANEL
CHOL/HDL RATIO: 3
Cholesterol: 126 mg/dL (ref 0–200)
HDL: 38.3 mg/dL — AB (ref >39.00)
LDL CALC: 64 mg/dL (ref 0–99)
NONHDL: 87.29
Triglycerides: 118 mg/dL (ref 0.0–149.0)
VLDL: 23.6 mg/dL (ref 0.0–40.0)

## 2017-03-26 LAB — HEMOGLOBIN A1C: Hgb A1c MFr Bld: 7.3 % — ABNORMAL HIGH (ref 4.6–6.5)

## 2017-03-31 ENCOUNTER — Other Ambulatory Visit: Payer: Self-pay | Admitting: Medical

## 2017-05-01 ENCOUNTER — Other Ambulatory Visit: Payer: Self-pay | Admitting: Medical

## 2017-05-14 DIAGNOSIS — M170 Bilateral primary osteoarthritis of knee: Secondary | ICD-10-CM | POA: Diagnosis not present

## 2017-05-14 DIAGNOSIS — M1611 Unilateral primary osteoarthritis, right hip: Secondary | ICD-10-CM | POA: Diagnosis not present

## 2017-05-15 DIAGNOSIS — M1611 Unilateral primary osteoarthritis, right hip: Secondary | ICD-10-CM | POA: Diagnosis not present

## 2017-07-01 ENCOUNTER — Telehealth: Payer: Self-pay | Admitting: *Deleted

## 2017-07-01 MED ORDER — METFORMIN HCL 500 MG PO TABS: 500.0000 mg | ORAL_TABLET | Freq: Three times a day (TID) | ORAL | 0 refills | Status: DC

## 2017-07-01 NOTE — Telephone Encounter (Signed)
Received fax from [facility] requesting 90 day supply of Metformin. Pt last seen by PCP 03/13/17 and was due for 3 month follow up in May. 90 day supply sent. Please call pt to schedule appt with [name] soon. Thanks!

## 2017-07-02 NOTE — Telephone Encounter (Signed)
LVM at home phone for pt to call and schedule a FU appt with provider. Pt is due for FU appt.

## 2017-07-03 ENCOUNTER — Other Ambulatory Visit: Payer: Self-pay | Admitting: Medical

## 2017-07-09 ENCOUNTER — Encounter: Payer: Self-pay | Admitting: Medical

## 2017-07-09 ENCOUNTER — Ambulatory Visit: Payer: Medicare Other | Admitting: Medical

## 2017-07-09 VITALS — BP 130/68 | HR 109 | Temp 97.9°F | Resp 24 | Ht 72.0 in | Wt 262.0 lb

## 2017-07-09 DIAGNOSIS — Z794 Long term (current) use of insulin: Secondary | ICD-10-CM | POA: Diagnosis not present

## 2017-07-09 DIAGNOSIS — E118 Type 2 diabetes mellitus with unspecified complications: Secondary | ICD-10-CM | POA: Diagnosis not present

## 2017-07-09 DIAGNOSIS — M25551 Pain in right hip: Secondary | ICD-10-CM | POA: Diagnosis not present

## 2017-07-09 DIAGNOSIS — E785 Hyperlipidemia, unspecified: Secondary | ICD-10-CM | POA: Diagnosis not present

## 2017-07-09 DIAGNOSIS — J449 Chronic obstructive pulmonary disease, unspecified: Secondary | ICD-10-CM

## 2017-07-09 NOTE — Progress Notes (Signed)
Subjective:    Patient ID: Lance Powell, male    DOB: Apr 27, 1929, 84 y.o.   MRN: [id number]  HPI  Pt in with recent rt hip pain. Pt got joint injection with orthopedist. He states the injection did help his hip.   Also he got injection for his knee as well. This helped very quickly per pt.  Pt last a1c was 7.3. Pt is on invokana and metformin. States compliant on meds. Pt admits not strict on his diet recently.  Pt last lipid panel looked ok. Today he is not fasting.  No acute illness signs or symptoms.   Review of Systems  Constitutional: Negative for chills, fatigue and fever.  Respiratory: Negative for cough, chest tightness, shortness of breath and wheezing.        Rare occasional use of albuterol for wheezing/sob.  Cardiovascular: Negative for chest pain and palpitations.  Gastrointestinal: Negative for abdominal pain and anal bleeding.  Musculoskeletal: Negative for back pain.  Neurological: Negative for dizziness, seizures, speech difficulty, numbness and headaches.  Hematological: Negative for adenopathy. Does not bruise/bleed easily.  Psychiatric/Behavioral: Negative for behavioral problems and confusion.    Past Medical History:  Diagnosis Date  . COPD (chronic obstructive pulmonary disease) ([place])   . Depression 03/08/2014  . Diabetes mellitus   . GERD (gastroesophageal reflux disease) 03/08/2014  . History of blood transfusion   . Hyperlipemia   . Hypertension   . OSA (obstructive sleep apnea)   . Osteoarthritis   . Rheumatoid arthritis(714.0)   . Sleep apnea    cpap     Social History   Socioeconomic History  . Marital status: Married    Spouse name: Not on file  . Number of children: 6  . Years of education: Not on file  . Highest education level: Not on file  Occupational History  . Occupation: Retired    Comment: Glass blower/designer  . Financial resource strain: Not on file  . Food insecurity:    Worry: Not on file    Inability: Not  on file  . Transportation needs:    Medical: Not on file    Non-medical: Not on file  Tobacco Use  . Smoking status: Former Smoker    Packs/day: 4.00    Years: 30.00    Pack years: 120.00    Types: Cigarettes    Last attempt to quit: 01/21/1978    Years since quitting: 39.4  . Smokeless tobacco: Never Used  Substance and Sexual Activity  . Alcohol use: No  . Drug use: No  . Sexual activity: Not on file  Lifestyle  . Physical activity:    Days per week: Not on file    Minutes per session: Not on file  . Stress: Not on file  Relationships  . Social connections:    Talks on phone: Not on file    Gets together: Not on file    Attends religious service: Not on file    Active member of club or organization: Not on file    Attends meetings of clubs or organizations: Not on file    Relationship status: Not on file  . Intimate partner violence:    Fear of current or ex partner: Not on file    Emotionally abused: Not on file    Physically abused: Not on file    Forced sexual activity: Not on file  Other Topics Concern  . Not on file  Social History Narrative  . Not on  file    Past Surgical History:  Procedure Laterality Date  . APPENDECTOMY  1969  . CHOLECYSTECTOMY N/A 06/16/2014   Procedure: LAPAROSCOPIC CHOLECYSTECTOMY;  Surgeon: Greer Pickerel, MD;  Location: [facility];  Service: General;  Laterality: N/A;  . CYSTOSCOPY  01/23/2011   Procedure: CYSTOSCOPY;  Surgeon: Molli Hazard, MD;  Location: [facility];  Service: Urology;  Laterality: N/A;  . FLEXIBLE SIGMOIDOSCOPY  01/07/2012   Procedure: FLEXIBLE SIGMOIDOSCOPY;  Surgeon: Ladene Artist, MD,FACG;  Location: [facility];  Service: Endoscopy;  Laterality: N/A;  . HERNIA REPAIR  1990   right and left inguinal  . NISSEN FUNDOPLICATION    . 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 TONSILLECTOMY  1937  . TRANSURETHRAL RESECTION OF PROSTATE  01/23/2011   Procedure: TRANSURETHRAL RESECTION OF THE  PROSTATE WITH GYRUS INSTRUMENTS;  Surgeon: Molli Hazard, MD;  Location: [facility];  Service: Urology;  Laterality: N/A;  . VASECTOMY  1972    Family History  Problem Relation Age of Onset  . Heart disease Mother   . Emphysema Brother   . Cancer Daughter        breast    No Known Allergies  Current Outpatient Medications on File Prior to Visit  Medication Sig Dispense Refill  . albuterol (PROVENTIL HFA;VENTOLIN HFA) 108 (90 Base) MCG/ACT inhaler Inhale 2 puffs into the lungs every 6 (six) hours as needed for wheezing or shortness of breath. 1 Inhaler 2  . aspirin 81 MG tablet Take 1 tablet (81 mg total) by mouth every morning. Stop for 1 week and then resume (Patient taking differently: Take 81 mg by mouth every morning. )    . atorvastatin (LIPITOR) 40 MG tablet TAKE 1 TABLET BY MOUTH ONCE DAILY IN THE MORNING 90 tablet 0  . canagliflozin (INVOKANA) 100 MG TABS tablet Take 1 tablet (100 mg total) by mouth daily before breakfast. 30 tablet 3  . celecoxib (CELEBREX) 200 MG capsule TAKE 1 CAPSULE BY MOUTH ONCE DAILY IN THE EVENING 30 capsule 1  . cholecalciferol (VITAMIN D) 1000 UNITS tablet Take 2,000 Units by mouth 2 (two) times daily.     . clonazePAM (KLONOPIN) 0.25 MG disintegrating tablet 1 tab po q hs prn insomnia or anxiety 10 tablet 2  . enalapril (VASOTEC) 2.5 MG tablet Take 2.5 mg by mouth every morning. Reported on 07/12/2015    . glucose blood (BAYER CONTOUR TEST) test strip Use as instructed to check blood sugar twice a day.  DX  E11.9 100 each 5  . Indacaterol Maleate (ARCAPTA NEOHALER) 75 MCG CAPS Place 1 capsule into inhaler and inhale every morning. 90 capsule 1  . ketoconazole (NIZORAL) 2 % cream As  Directed.  0  . ketoconazole (NIZORAL) 2 % shampoo As directed.  0  . lidocaine (LIDODERM) 5 % Place 1 patch onto the skin daily. Remove & Discard patch within 12 hours or as directed by MD 30 patch 0  . meclizine (ANTIVERT) 12.5 MG tablet Take 1  tablet (12.5 mg total) by mouth 3 (three) times daily as needed for dizziness. 30 tablet 0  . metFORMIN (GLUCOPHAGE) 500 MG tablet Take 1 tablet (500 mg total) by mouth 3 (three) times daily. 270 tablet 0  . misoprostol (CYTOTEC) 100 MCG tablet TAKE 1 TABLET BY MOUTH IN THE MORNING, 1 TABLET IN THE EVENING, AND 2 AT BEDTIME 120 tablet 1  . Multiple Vitamins-Minerals (CENTRUM) tablet Take 1 tablet by mouth every morning.  Reported on 01/05/2015    . OVER THE COUNTER MEDICATION Place 1-2 drops into both eyes daily as needed (dry red eyes.). Reported on 01/05/2015    . Tiotropium Bromide-Olodaterol (STIOLTO RESPIMAT) 2.5-2.5 MCG/ACT AERS Inhale 2 puffs into the lungs daily. 4 g 5  . tobramycin (TOBREX) 0.3 % ophthalmic solution Place 2 drops into the left eye every 6 (six) hours. 5 mL 0  . UNABLE TO FIND CPAP    . venlafaxine XR (EFFEXOR-XR) 37.5 MG 24 hr capsule TAKE 1 CAPSULE BY MOUTH ONCE DAILY WITH BREAKFAST 90 capsule 0  . vitamin C (ASCORBIC ACID) 500 MG tablet Take 500 mg by mouth every morning.      No current facility-administered medications on file prior to visit.     BP 130/68 (BP Location: Left Arm, Patient Position: Sitting, Cuff Size: Normal)   Pulse (!) 109   Temp 97.9 F (36.6 C) (Oral)   Resp (!) 24   Ht 6' (1.829 m)   Wt 262 lb (118.8 kg)   SpO2 (!) 86%   BMI 35.53 kg/m       Objective:   Physical Exam   General Mental Status- Alert. General Appearance- Not in acute distress.   Skin General: Color- Normal Color. Moisture- Normal Moisture.  Neck Carotid Arteries- Normal color. Moisture- Normal Moisture. No carotid bruits. No JVD.  Chest and Lung Exam Auscultation: Breath Sounds:-Normal.  Cardiovascular Auscultation:Rythm- Regular. Murmurs & Other Heart Sounds:Auscultation of the heart reveals- No Murmurs.  Abdomen Inspection:-Inspeection Normal. Palpation/Percussion:Note:No mass. Palpation and Percussion of the abdomen reveal- Non Tender, Non Distended  + BS, no rebound or guarding.    Neurologic Cranial Nerve exam:- CN III-XII intact(No nystagmus), symmetric smile. Strength:- 5/5 equal and symmetric strength both upper and lower extremities.  Lower ext- see quality metrics    Assessment & Plan:  For hyperlipidemia and diabetes, I did place future labs(labs to be scheduled tomorrow in am when  fasting).  Will follow lipid panel and A1c to determine if you need adjustment on treatment regimen.  For COPD, continue oxygen and inhaler if needed  If your right hip pain or knee pain returns let us know and we could get you back in with orthopedist.  Reminder to eat low-cholesterol and low sugar diet.  Follow-up date to be determined after lab review  [name], PA-C

## 2017-07-09 NOTE — Patient Instructions (Addendum)
For hyperlipidemia and diabetes, I did place future labs(labs to be scheduled tomorrow in am when fasting).  Will follow lipid panel and A1c to determine if you need adjustment on treatment regimen.  For COPD, continue oxygen and inhaler if needed  If your right hip pain or knee pain returns let us know and we could get you back in with orthopedist.  Reminder to eat low-cholesterol and low sugar diet.  Follow-up date to be determined after lab review

## 2017-07-10 ENCOUNTER — Telehealth: Payer: Self-pay | Admitting: Medical

## 2017-07-10 ENCOUNTER — Other Ambulatory Visit (INDEPENDENT_AMBULATORY_CARE_PROVIDER_SITE_OTHER): Payer: Medicare Other

## 2017-07-10 DIAGNOSIS — E118 Type 2 diabetes mellitus with unspecified complications: Secondary | ICD-10-CM | POA: Diagnosis not present

## 2017-07-10 DIAGNOSIS — E785 Hyperlipidemia, unspecified: Secondary | ICD-10-CM

## 2017-07-10 DIAGNOSIS — Z794 Long term (current) use of insulin: Secondary | ICD-10-CM | POA: Diagnosis not present

## 2017-07-10 LAB — HEMOGLOBIN A1C: Hgb A1c MFr Bld: 8.3 % — ABNORMAL HIGH (ref 4.6–6.5)

## 2017-07-10 LAB — COMPREHENSIVE METABOLIC PANEL
ALT: 22 U/L (ref 0–53)
AST: 15 U/L (ref 0–37)
Albumin: 4.1 g/dL (ref 3.5–5.2)
Alkaline Phosphatase: 80 U/L (ref 39–117)
BUN: 20 mg/dL (ref 6–23)
CALCIUM: 10 mg/dL (ref 8.4–10.5)
CO2: 32 mEq/L (ref 19–32)
Chloride: 102 mEq/L (ref 96–112)
Creatinine, Ser: 1.13 mg/dL (ref 0.40–1.50)
GFR: 65.43 mL/min (ref >60.00)
GLUCOSE: 165 mg/dL — AB (ref 70–99)
POTASSIUM: 3.9 meq/L (ref 3.5–5.1)
Sodium: 141 mEq/L (ref 135–145)
TOTAL PROTEIN: 6.5 g/dL (ref 6.0–8.3)
Total Bilirubin: 0.6 mg/dL (ref 0.2–1.2)

## 2017-07-10 LAB — LIPID PANEL
Cholesterol: 137 mg/dL (ref 0–200)
HDL: 43.9 mg/dL (ref >39.00)
LDL Cholesterol: 73 mg/dL (ref 0–99)
NONHDL: 92.81
TRIGLYCERIDES: 99 mg/dL (ref 0.0–149.0)
Total CHOL/HDL Ratio: 3
VLDL: 19.8 mg/dL (ref 0.0–40.0)

## 2017-07-10 NOTE — Telephone Encounter (Signed)
Opened to review meds.

## 2017-07-11 ENCOUNTER — Other Ambulatory Visit: Payer: Self-pay | Admitting: Medical

## 2017-07-11 ENCOUNTER — Telehealth: Payer: Self-pay | Admitting: Medical

## 2017-07-11 NOTE — Telephone Encounter (Signed)
Copied from [place] [id number]. Topic: Inquiry >> Jul 11, 2017  3:17 PM Margot Ables wrote: Reason for CRM: pt called stating he was expecting a call back about 12:30pm today with his results. He states he received a call but he was still sleeping. Pt advised nurse/CMA not available and will get a call back. No labs released to [facility]. Please contact pt.

## 2017-07-14 MED ORDER — ALBUTEROL SULFATE HFA 108 (90 BASE) MCG/ACT IN AERS: 2.0000 | INHALATION_SPRAY | Freq: Four times a day (QID) | RESPIRATORY_TRACT | 2 refills | Status: DC | PRN

## 2017-07-14 MED ORDER — PIOGLITAZONE HCL 15 MG PO TABS: 15.0000 mg | ORAL_TABLET | Freq: Every day | ORAL | 2 refills | Status: DC

## 2017-07-14 NOTE — Telephone Encounter (Signed)
Patient calling back for lab results. Would like a call back from [place].

## 2017-07-14 NOTE — Telephone Encounter (Signed)
Refill sent on albuterol inhaler. For sugar- I sent rx for actos in place of the invokana, should be cheaper. Work on diet and follow up with Lance Powell in 3 months.

## 2017-07-14 NOTE — Telephone Encounter (Signed)
See results note. 

## 2017-07-14 NOTE — Telephone Encounter (Signed)
Patient reports he has not taken Invokana since last year due to price. He said he will need a substitution for this and a rx for an inhaler to use prn.

## 2017-07-15 NOTE — Telephone Encounter (Signed)
Notified pt and scheduled f/u for 10/15/17 at 2pm.

## 2017-07-15 NOTE — Telephone Encounter (Signed)
Results and appointment reminder mailed to pt at his request.

## 2017-08-03 ENCOUNTER — Other Ambulatory Visit: Payer: Self-pay | Admitting: Medical

## 2017-08-03 DIAGNOSIS — E119 Type 2 diabetes mellitus without complications: Secondary | ICD-10-CM | POA: Diagnosis not present

## 2017-08-17 ENCOUNTER — Other Ambulatory Visit: Payer: Self-pay | Admitting: Medical

## 2017-08-26 ENCOUNTER — Other Ambulatory Visit: Payer: Self-pay | Admitting: Family

## 2017-09-17 ENCOUNTER — Other Ambulatory Visit: Payer: Self-pay | Admitting: Medical

## 2017-09-23 ENCOUNTER — Ambulatory Visit: Payer: Medicare Other | Admitting: *Deleted

## 2017-09-24 NOTE — Progress Notes (Addendum)
Subjective:   Lance Powell is a 84 y.o. male who presents for Medicare Annual/Subsequent preventive examination.  Review of Systems: No ROS.  Medicare Wellness Visit. Additional risk factors are reflected in the social history.   Sleep patterns: Wears CPAP. Sleeps 10-12 hrs. Home Safety/Smoke Alarms: Feels safe in home. Smoke alarms in place. Lives alone. Walk-in shower, with seat, and grab rails. Daughter comes by daily.  Male:   PSA- No results found for: PSA Dentist- Dr.Russell as needed.  Eye- Dr.Shapiro yearly-utd per pt     Objective:    Vitals: There were no vitals taken for this visit.  There is no height or weight on file to calculate BMI.  Advanced Directives 09/19/2016 05/06/2016 04/19/2016 07/12/2015 06/16/2014 06/16/2014 06/13/2014  Does Patient Have a Medical Advance Directive? Yes Yes Yes Yes Yes - Yes  Type of Advance Directive [place];Living will - Healthcare Power of [place];Living will - - -  Does patient want to make changes to medical advance directive? No - Patient declined No - Patient declined - - - - -  Copy of [place] in Chart? No - copy requested - - - Yes Yes No - copy requested  Pre-existing out of facility DNR order (yellow form or pink MOST form) - - - - - - -    Tobacco Social History   Tobacco Use  Smoking Status Former Smoker  . Packs/day: 4.00  . Years: 30.00  . Pack years: 120.00  . Types: Cigarettes  . Last attempt to quit: 01/21/1978  . Years since quitting: 39.7  Smokeless Tobacco Never Used     Counseling given: Not Answered   Clinical Intake:                       Past Medical History:  Diagnosis Date  . COPD (chronic obstructive pulmonary disease) ([place])   . Depression 03/08/2014  . Diabetes mellitus   . GERD (gastroesophageal reflux disease) 03/08/2014  . History of blood transfusion   . Hyperlipemia   . Hypertension   . OSA (obstructive  sleep apnea)   . Osteoarthritis   . Rheumatoid arthritis(714.0)   . Sleep apnea    cpap   Past Surgical History:  Procedure Laterality Date  . APPENDECTOMY  1969  . CHOLECYSTECTOMY N/A 06/16/2014   Procedure: LAPAROSCOPIC CHOLECYSTECTOMY;  Surgeon: Greer Pickerel, MD;  Location: [facility];  Service: General;  Laterality: N/A;  . CYSTOSCOPY  01/23/2011   Procedure: CYSTOSCOPY;  Surgeon: Molli Hazard, MD;  Location: [facility];  Service: Urology;  Laterality: N/A;  . FLEXIBLE SIGMOIDOSCOPY  01/07/2012   Procedure: FLEXIBLE SIGMOIDOSCOPY;  Surgeon: Ladene Artist, MD,FACG;  Location: [facility];  Service: Endoscopy;  Laterality: N/A;  . HERNIA REPAIR  1990   right and left inguinal  . NISSEN FUNDOPLICATION    . 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 TONSILLECTOMY  1937  . TRANSURETHRAL RESECTION OF PROSTATE  01/23/2011   Procedure: TRANSURETHRAL RESECTION OF THE PROSTATE WITH GYRUS INSTRUMENTS;  Surgeon: Molli Hazard, MD;  Location: [facility];  Service: Urology;  Laterality: N/A;  . VASECTOMY  1972   Family History  Problem Relation Age of Onset  . Heart disease Mother   . Emphysema Brother   . Cancer Daughter        breast   Social History   Socioeconomic History  . Marital  status: Married    Spouse name: Not on file  . Number of children: 6  . Years of education: Not on file  . Highest education level: Not on file  Occupational History  . Occupation: Retired    Comment: Glass blower/designer  . Financial resource strain: Not on file  . Food insecurity:    Worry: Not on file    Inability: Not on file  . Transportation needs:    Medical: Not on file    Non-medical: Not on file  Tobacco Use  . Smoking status: Former Smoker    Packs/day: 4.00    Years: 30.00    Pack years: 120.00    Types: Cigarettes    Last attempt to quit: 01/21/1978    Years since quitting: 39.7  . Smokeless tobacco: Never Used  Substance and  Sexual Activity  . Alcohol use: No  . Drug use: No  . Sexual activity: Not on file  Lifestyle  . Physical activity:    Days per week: Not on file    Minutes per session: Not on file  . Stress: Not on file  Relationships  . Social connections:    Talks on phone: Not on file    Gets together: Not on file    Attends religious service: Not on file    Active member of club or organization: Not on file    Attends meetings of clubs or organizations: Not on file    Relationship status: Not on file  Other Topics Concern  . Not on file  Social History Narrative  . Not on file    Outpatient Encounter Medications as of 09/26/2017  Medication Sig  . albuterol (PROVENTIL HFA;VENTOLIN HFA) 108 (90 Base) MCG/ACT inhaler Inhale 2 puffs into the lungs every 6 (six) hours as needed for wheezing or shortness of breath.  Marland Kitchen aspirin 81 MG tablet Take 1 tablet (81 mg total) by mouth every morning. Stop for 1 week and then resume (Patient taking differently: Take 81 mg by mouth every morning. )  . atorvastatin (LIPITOR) 40 MG tablet TAKE 1 TABLET BY MOUTH ONCE DAILY IN THE MORNING  . celecoxib (CELEBREX) 200 MG capsule TAKE 1 CAPSULE BY MOUTH ONCE DAILY IN THE EVENING  . cholecalciferol (VITAMIN D) 1000 UNITS tablet Take 2,000 Units by mouth 2 (two) times daily.   . clonazePAM (KLONOPIN) 0.25 MG disintegrating tablet 1 tab po q hs prn insomnia or anxiety  . enalapril (VASOTEC) 2.5 MG tablet Take 2.5 mg by mouth every morning. Reported on 07/12/2015  . glucose blood (BAYER CONTOUR TEST) test strip Use as instructed to check blood sugar twice a day.  DX  E11.9  . Indacaterol Maleate (ARCAPTA NEOHALER) 75 MCG CAPS Place 1 capsule into inhaler and inhale every morning.  Marland Kitchen ketoconazole (NIZORAL) 2 % cream As  Directed.  Marland Kitchen ketoconazole (NIZORAL) 2 % shampoo As directed.  . lidocaine (LIDODERM) 5 % Place 1 patch onto the skin daily. Remove & Discard patch within 12 hours or as directed by MD  . meclizine  (ANTIVERT) 12.5 MG tablet Take 1 tablet (12.5 mg total) by mouth 3 (three) times daily as needed for dizziness.  . metFORMIN (GLUCOPHAGE) 500 MG tablet Take 1 tablet (500 mg total) by mouth 3 (three) times daily.  . misoprostol (CYTOTEC) 100 MCG tablet TAKE 1 TABLET BY MOUTH ONCE DAILY IN THE MORNING AND TAKE 1 TABLET IN THE EVENING AND 2 TABLETS AT BEDTIME  . Multiple Vitamins-Minerals (CENTRUM)  tablet Take 1 tablet by mouth every morning. Reported on 01/05/2015  . OVER THE COUNTER MEDICATION Place 1-2 drops into both eyes daily as needed (dry red eyes.). Reported on 01/05/2015  . pioglitazone (ACTOS) 15 MG tablet TAKE 1 TABLET BY MOUTH ONCE DAILY  . Tiotropium Bromide-Olodaterol (STIOLTO RESPIMAT) 2.5-2.5 MCG/ACT AERS Inhale 2 puffs into the lungs daily.  Marland Kitchen tobramycin (TOBREX) 0.3 % ophthalmic solution Place 2 drops into the left eye every 6 (six) hours.  Marland Kitchen UNABLE TO FIND CPAP  . venlafaxine XR (EFFEXOR-XR) 37.5 MG 24 hr capsule TAKE 1 CAPSULE BY MOUTH ONCE DAILY WITH BREAKFAST  . vitamin C (ASCORBIC ACID) 500 MG tablet Take 500 mg by mouth every morning.    No facility-administered encounter medications on file as of 09/26/2017.     Activities of Daily Living No flowsheet data found.  Patient Care Team: [name], Iris Pert as PCP - General (Physician Assistant) Collene Gobble, MD as Consulting Physician (Pulmonary Disease) [name], MD as Consulting Physician (Ophthalmology)   Assessment:   This is a routine wellness examination for Ren. Physical assessment deferred to PCP.  Exercise Activities and Dietary recommendations   Diet (meal preparation, eat out, water intake, caffeinated beverages, dairy products, fruits and vegetables): well balanced   Goals    . maintain healthy lifestyle.       Fall Risk Fall Risk  05/24/2016 06/28/2015 03/27/2015 03/08/2014  Falls in the past year? No No No No   Depression Screen PHQ 2/9 Scores 09/19/2016 05/24/2016 06/28/2015 03/27/2015  PHQ - 2  Score 0 0 0 0  PHQ- 9 Score - - - -  Exception Documentation - - Patient refusal -    Cognitive Function MMSE - Mini Mental State Exam 09/19/2016  Orientation to time 5  Orientation to Place 5  Registration 3  Attention/ Calculation 5  Recall 3  Language- name 2 objects 2  Language- repeat 1  Language- follow 3 step command 3  Language- read & follow direction 1  Write a sentence 1  Copy design 1  Total score 30        Immunization History  Administered Date(s) Administered  . Influenza Split 11/11/2010, 10/22/2011  . Influenza Whole 01/22/2008, 11/22/2009  . Influenza, High Dose Seasonal PF 09/27/2015, 11/04/2016  . Influenza,inj,Quad PF,6+ Mos 10/19/2012, 11/03/2013, 11/10/2014  . Pneumococcal Conjugate-13 03/27/2015  . Pneumococcal Polysaccharide-23 01/22/2007  . Tdap 03/27/2015    Screening Tests Health Maintenance  Topic Date Due  . OPHTHALMOLOGY EXAM  12/20/2016  . INFLUENZA VACCINE  08/21/2017  . HEMOGLOBIN A1C  01/09/2018  . FOOT EXAM  07/10/2018  . TETANUS/TDAP  03/26/2025  . PNA vac Low Risk Adult  Completed      Plan:    Please schedule your next medicare wellness visit with me in 1 yr.  Continue to eat heart healthy diet (full of fruits, vegetables, whole grains, lean protein, water--limit salt, fat, and sugar intake) and increase physical activity as tolerated.    I have personally reviewed and noted the following in the patient's chart:   . Medical and social history . Use of alcohol, tobacco or illicit drugs  . Current medications and supplements . Functional ability and status . Nutritional status . Physical activity . Advanced directives . List of other physicians . Hospitalizations, surgeries, and ER visits in previous 12 months . Vitals . Screenings to include cognitive, depression, and falls . Referrals and appointments  In addition, I have reviewed and discussed with patient  certain preventive protocols, quality metrics, and best  practice recommendations. A written personalized care plan for preventive services as well as general preventive health recommendations were provided to patient.     Shela Nevin, [place]  09/24/2017  Reviewed and agree with evaluation and assessment of RN.  [name], PA-C

## 2017-09-26 ENCOUNTER — Encounter: Payer: Self-pay | Admitting: *Deleted

## 2017-09-26 ENCOUNTER — Ambulatory Visit (INDEPENDENT_AMBULATORY_CARE_PROVIDER_SITE_OTHER): Payer: Medicare Other | Admitting: *Deleted

## 2017-09-26 VITALS — BP 120/62 | HR 98 | Ht 72.0 in | Wt 236.6 lb

## 2017-09-26 DIAGNOSIS — Z0000 Encounter for general adult medical examination without abnormal findings: Secondary | ICD-10-CM | POA: Diagnosis not present

## 2017-09-26 NOTE — Patient Instructions (Signed)
Please schedule your next medicare wellness visit with me in 1 yr.  Continue to eat heart healthy diet (full of fruits, vegetables, whole grains, lean protein, water--limit salt, fat, and sugar intake) and increase physical activity as tolerated.   Lance Powell , Thank you for taking time to come for your Medicare Wellness Visit. I appreciate your ongoing commitment to your health goals. Please review the following plan we discussed and let me know if I can assist you in the future.   These are the goals we discussed: Goals    . maintain healthy lifestyle.       This is a list of the screening recommended for you and due dates:  Health Maintenance  Topic Date Due  . Eye exam for diabetics  12/20/2016  . Flu Shot  08/21/2017  . Hemoglobin A1C  01/09/2018  . Complete foot exam   07/10/2018  . Tetanus Vaccine  03/26/2025  . Pneumonia vaccines  Completed    Health Maintenance, Male A healthy lifestyle and preventive care is important for your health and wellness. Ask your health care provider about what schedule of regular examinations is right for you. What should I know about weight and diet? Eat a Healthy Diet  Eat plenty of vegetables, fruits, whole grains, low-fat dairy products, and lean protein.  Do not eat a lot of foods high in solid fats, added sugars, or salt.  Maintain a Healthy Weight Regular exercise can help you achieve or maintain a healthy weight. You should:  Do at least 150 minutes of exercise each week. The exercise should increase your heart rate and make you sweat (moderate-intensity exercise).  Do strength-training exercises at least twice a week.  Watch Your Levels of Cholesterol and Blood Lipids  Have your blood tested for lipids and cholesterol every 5 years starting at 82 years of age. If you are at high risk for heart disease, you should start having your blood tested when you are 82 years old. You may need to have your cholesterol levels checked  more often if: ? Your lipid or cholesterol levels are high. ? You are older than 82 years of age. ? You are at high risk for heart disease.  What should I know about cancer screening? Many types of cancers can be detected early and may often be prevented. Lung Cancer  You should be screened every year for lung cancer if: ? You are a current smoker who has smoked for at least 30 years. ? You are a former smoker who has quit within the past 15 years.  Talk to your health care provider about your screening options, when you should start screening, and how often you should be screened.  Colorectal Cancer  Routine colorectal cancer screening usually begins at 82 years of age and should be repeated every 5-10 years until you are 82 years old. You may need to be screened more often if early forms of precancerous polyps or small growths are found. Your health care provider may recommend screening at an earlier age if you have risk factors for colon cancer.  Your health care provider may recommend using home test kits to check for hidden blood in the stool.  A small camera at the end of a tube can be used to examine your colon (sigmoidoscopy or colonoscopy). This checks for the earliest forms of colorectal cancer.  Prostate and Testicular Cancer  Depending on your age and overall health, your health care provider may do certain tests  to screen for prostate and testicular cancer.  Talk to your health care provider about any symptoms or concerns you have about testicular or prostate cancer.  Skin Cancer  Check your skin from head to toe regularly.  Tell your health care provider about any new moles or changes in moles, especially if: ? There is a change in a mole's size, shape, or color. ? You have a mole that is larger than a pencil eraser.  Always use sunscreen. Apply sunscreen liberally and repeat throughout the day.  Protect yourself by wearing long sleeves, pants, a wide-brimmed hat,  and sunglasses when outside.  What should I know about heart disease, diabetes, and high blood pressure?  If you are 34-59 years of age, have your blood pressure checked every 3-5 years. If you are 14 years of age or older, have your blood pressure checked every year. You should have your blood pressure measured twice-once when you are at a hospital or clinic, and once when you are not at a hospital or clinic. Record the average of the two measurements. To check your blood pressure when you are not at a hospital or clinic, you can use: ? An automated blood pressure machine at a pharmacy. ? A home blood pressure monitor.  Talk to your health care provider about your target blood pressure.  If you are between 79-81 years old, ask your health care provider if you should take aspirin to prevent heart disease.  Have regular diabetes screenings by checking your fasting blood sugar level. ? If you are at a normal weight and have a low risk for diabetes, have this test once every three years after the age of 54. ? If you are overweight and have a high risk for diabetes, consider being tested at a younger age or more often.  A one-time screening for abdominal aortic aneurysm (AAA) by ultrasound is recommended for men aged 80-75 years who are current or former smokers. What should I know about preventing infection? Hepatitis B If you have a higher risk for hepatitis B, you should be screened for this virus. Talk with your health care provider to find out if you are at risk for hepatitis B infection. Hepatitis C Blood testing is recommended for:  Everyone born from [birth year] through 1963.  Anyone with known risk factors for hepatitis C.  Sexually Transmitted Diseases (STDs)  You should be screened each year for STDs including gonorrhea and chlamydia if: ? You are sexually active and are younger than 82 years of age. ? You are older than 82 years of age and your health care provider tells you that you  are at risk for this type of infection. ? Your sexual activity has changed since you were last screened and you are at an increased risk for chlamydia or gonorrhea. Ask your health care provider if you are at risk.  Talk with your health care provider about whether you are at high risk of being infected with HIV. Your health care provider may recommend a prescription medicine to help prevent HIV infection.  What else can I do?  Schedule regular health, dental, and eye exams.  Stay current with your vaccines (immunizations).  Do not use any tobacco products, such as cigarettes, chewing tobacco, and e-cigarettes. If you need help quitting, ask your health care provider.  Limit alcohol intake to no more than 2 drinks per day. One drink equals 12 ounces of beer, 5 ounces of wine, or 1 ounces of hard liquor.  Do not use street drugs.  Do not share needles.  Ask your health care provider for help if you need support or information about quitting drugs.  Tell your health care provider if you often feel depressed.  Tell your health care provider if you have ever been abused or do not feel safe at home. This information is not intended to replace advice given to you by your health care provider. Make sure you discuss any questions you have with your health care provider. Document Released: 07/06/2007 Document Revised: 09/06/2015 Document Reviewed: 10/11/2014 Elsevier Interactive Patient Education  [employer].

## 2017-09-29 ENCOUNTER — Other Ambulatory Visit: Payer: Self-pay | Admitting: Medical

## 2017-10-15 ENCOUNTER — Ambulatory Visit: Payer: Medicare Other | Admitting: Medical

## 2017-10-15 DIAGNOSIS — E119 Type 2 diabetes mellitus without complications: Secondary | ICD-10-CM | POA: Diagnosis not present

## 2017-10-19 ENCOUNTER — Other Ambulatory Visit: Payer: Self-pay | Admitting: Medical

## 2017-10-22 ENCOUNTER — Encounter: Payer: Self-pay | Admitting: Medical

## 2017-10-22 ENCOUNTER — Ambulatory Visit: Payer: Medicare Other | Admitting: Medical

## 2017-10-22 ENCOUNTER — Ambulatory Visit (HOSPITAL_BASED_OUTPATIENT_CLINIC_OR_DEPARTMENT_OTHER)
Admission: RE | Admit: 2017-10-22 | Discharge: 2017-10-22 | Disposition: A | Discharge status: Home / Self Care | Payer: Medicare Other | Source: Ambulatory Visit | Attending: Medical | Admitting: Medical

## 2017-10-22 VITALS — BP 115/60 | HR 107 | Temp 97.7°F | Resp 16 | Ht 72.0 in | Wt 240.0 lb

## 2017-10-22 DIAGNOSIS — E118 Type 2 diabetes mellitus with unspecified complications: Secondary | ICD-10-CM | POA: Diagnosis not present

## 2017-10-22 DIAGNOSIS — S99922A Unspecified injury of left foot, initial encounter: Secondary | ICD-10-CM | POA: Diagnosis not present

## 2017-10-22 DIAGNOSIS — E785 Hyperlipidemia, unspecified: Secondary | ICD-10-CM | POA: Diagnosis not present

## 2017-10-22 DIAGNOSIS — M79672 Pain in left foot: Secondary | ICD-10-CM

## 2017-10-22 DIAGNOSIS — Z1283 Encounter for screening for malignant neoplasm of skin: Secondary | ICD-10-CM

## 2017-10-22 DIAGNOSIS — L989 Disorder of the skin and subcutaneous tissue, unspecified: Secondary | ICD-10-CM | POA: Diagnosis not present

## 2017-10-22 DIAGNOSIS — Z794 Long term (current) use of insulin: Secondary | ICD-10-CM

## 2017-10-22 DIAGNOSIS — Z23 Encounter for immunization: Secondary | ICD-10-CM

## 2017-10-22 DIAGNOSIS — M7989 Other specified soft tissue disorders: Secondary | ICD-10-CM | POA: Diagnosis not present

## 2017-10-22 NOTE — Patient Instructions (Signed)
For your diabetes, want you to eat low sugar diet and continue current medications.  I am placing future CMP and A1c to be done tomorrow morning along with lipid panel.  Please schedule that on the way out.  For high cholesterol, continue current statin medication.  We will update you on the panel results when that is in.  For skin lesion on the left side of the neck as well as numerous skin lesions on your back, I do think it would be a good idea to refer you to dermatologist for skin cancer surveillance.  Hopefully they will be able to treat the lesion on the left side of your neck.  If not then please let me know.  For recent left foot pain shortly after breaking glass and potentially stepping on a piece, I am getting x-ray of your left foot.  If no glass seen then it is possible that you have a plantar wart as a cause of your pain.  Could consider cryo freeze over-the-counter treatment of wart potentially or direct referral to podiatrist for surgical procedure.  I do not think salicylic acid treatment is a good idea in light of your diabetes.  Flu vaccine given today.  Follow-up date to be determined after lab review.

## 2017-10-22 NOTE — Progress Notes (Addendum)
Subjective:    Patient ID: Lance Powell, male    DOB: 01/26/1929, 84 y.o.   MRN: [id number]  HPI  Pt in for follow up.  Pt state his foot hurts when he walks. He states he stepped on piece of glass about 1-2 month ago. He thinks that is the case but he is not sure. He notes pain started around that time. Pain when he is walking.  Pt is diabetic and his a1-c was elevated at 8.3. Pt by chart is on metformin and actos. He had been on invokana in the past.  Pt bp is controlled today.  Pt has high cholesterol. He ate breakfast today.    Review of Systems  Constitutional: Negative for chills, fatigue and fever.  Respiratory: Negative for cough, chest tightness, shortness of breath and wheezing.   Cardiovascular: Negative for chest pain and palpitations.  Gastrointestinal: Negative for abdominal pain.  Musculoskeletal: Negative for back pain.       Foot pain.  Skin: Negative for rash.       See hpi.  Neurological: Negative for dizziness and light-headedness.  Hematological: Negative for adenopathy. Does not bruise/bleed easily.  Psychiatric/Behavioral: Negative for confusion and decreased concentration.   Past Medical History:  Diagnosis Date  . COPD (chronic obstructive pulmonary disease) ([place])   . Depression 03/08/2014  . Diabetes mellitus   . GERD (gastroesophageal reflux disease) 03/08/2014  . History of blood transfusion   . Hyperlipemia   . Hypertension   . OSA (obstructive sleep apnea)   . Osteoarthritis   . Rheumatoid arthritis(714.0)   . Sleep apnea    cpap     Social History   Socioeconomic History  . Marital status: Married    Spouse name: Not on file  . Number of children: 6  . Years of education: Not on file  . Highest education level: Not on file  Occupational History  . Occupation: Retired    Comment: Glass blower/designer  . Financial resource strain: Not on file  . Food insecurity:    Worry: Not on file    Inability: Not on file  .  Transportation needs:    Medical: Not on file    Non-medical: Not on file  Tobacco Use  . Smoking status: Former Smoker    Packs/day: 4.00    Years: 30.00    Pack years: 120.00    Types: Cigarettes    Last attempt to quit: 01/21/1978    Years since quitting: 39.7  . Smokeless tobacco: Never Used  Substance and Sexual Activity  . Alcohol use: No  . Drug use: No  . Sexual activity: Not on file  Lifestyle  . Physical activity:    Days per week: Not on file    Minutes per session: Not on file  . Stress: Not on file  Relationships  . Social connections:    Talks on phone: Not on file    Gets together: Not on file    Attends religious service: Not on file    Active member of club or organization: Not on file    Attends meetings of clubs or organizations: Not on file    Relationship status: Not on file  . Intimate partner violence:    Fear of current or ex partner: Not on file    Emotionally abused: Not on file    Physically abused: Not on file    Forced sexual activity: Not on file  Other Topics Concern  .  Not on file  Social History Narrative  . Not on file    Past Surgical History:  Procedure Laterality Date  . APPENDECTOMY  1969  . CHOLECYSTECTOMY N/A 06/16/2014   Procedure: LAPAROSCOPIC CHOLECYSTECTOMY;  Surgeon: Greer Pickerel, MD;  Location: [facility];  Service: General;  Laterality: N/A;  . CYSTOSCOPY  01/23/2011   Procedure: CYSTOSCOPY;  Surgeon: Molli Hazard, MD;  Location: [facility];  Service: Urology;  Laterality: N/A;  . FLEXIBLE SIGMOIDOSCOPY  01/07/2012   Procedure: FLEXIBLE SIGMOIDOSCOPY;  Surgeon: Ladene Artist, MD,FACG;  Location: [facility];  Service: Endoscopy;  Laterality: N/A;  . HERNIA REPAIR  1990   right and left inguinal  . NISSEN FUNDOPLICATION    . 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 TONSILLECTOMY  1937  . TRANSURETHRAL RESECTION OF PROSTATE  01/23/2011   Procedure: TRANSURETHRAL RESECTION OF THE PROSTATE WITH  GYRUS INSTRUMENTS;  Surgeon: Molli Hazard, MD;  Location: [facility];  Service: Urology;  Laterality: N/A;  . VASECTOMY  1972    Family History  Problem Relation Age of Onset  . Heart disease Mother   . Emphysema Brother   . Cancer Daughter        breast    No Known Allergies  Current Outpatient Medications on File Prior to Visit  Medication Sig Dispense Refill  . albuterol (PROVENTIL HFA;VENTOLIN HFA) 108 (90 Base) MCG/ACT inhaler Inhale 2 puffs into the lungs every 6 (six) hours as needed for wheezing or shortness of breath. 1 Inhaler 2  . aspirin 81 MG tablet Take 1 tablet (81 mg total) by mouth every morning. Stop for 1 week and then resume (Patient taking differently: Take 81 mg by mouth every morning. )    . atorvastatin (LIPITOR) 40 MG tablet TAKE 1 TABLET BY MOUTH ONCE DAILY IN THE MORNING 90 tablet 0  . celecoxib (CELEBREX) 200 MG capsule TAKE 1 CAPSULE BY MOUTH ONCE DAILY IN THE EVENING 30 capsule 1  . cholecalciferol (VITAMIN D) 1000 UNITS tablet Take 2,000 Units by mouth 2 (two) times daily.     . clonazePAM (KLONOPIN) 0.25 MG disintegrating tablet 1 tab po q hs prn insomnia or anxiety 10 tablet 2  . enalapril (VASOTEC) 2.5 MG tablet Take 2.5 mg by mouth every morning. Reported on 07/12/2015    . glucose blood (BAYER CONTOUR TEST) test strip Use as instructed to check blood sugar twice a day.  DX  E11.9 100 each 5  . Indacaterol Maleate (ARCAPTA NEOHALER) 75 MCG CAPS Place 1 capsule into inhaler and inhale every morning. 90 capsule 1  . ketoconazole (NIZORAL) 2 % cream As  Directed.  0  . ketoconazole (NIZORAL) 2 % shampoo As directed.  0  . lidocaine (LIDODERM) 5 % Place 1 patch onto the skin daily. Remove & Discard patch within 12 hours or as directed by MD 30 patch 0  . meclizine (ANTIVERT) 12.5 MG tablet Take 1 tablet (12.5 mg total) by mouth 3 (three) times daily as needed for dizziness. 30 tablet 0  . metFORMIN (GLUCOPHAGE) 500 MG tablet Take 1  tablet (500 mg total) by mouth 3 (three) times daily. 270 tablet 0  . misoprostol (CYTOTEC) 100 MCG tablet TAKE 1 TABLET BY MOUTH ONCE DAILY IN THE MORNING AND TAKE 1 TABLET IN THE EVENING AND 2 TABLETS AT BEDTIME 120 tablet 1  . Multiple Vitamins-Minerals (CENTRUM) tablet Take 1 tablet by mouth every morning. Reported on 01/05/2015    .  OVER THE COUNTER MEDICATION Place 1-2 drops into both eyes daily as needed (dry red eyes.). Reported on 01/05/2015    . pioglitazone (ACTOS) 15 MG tablet TAKE 1 TABLET BY MOUTH ONCE DAILY 90 tablet 0  . Tiotropium Bromide-Olodaterol (STIOLTO RESPIMAT) 2.5-2.5 MCG/ACT AERS Inhale 2 puffs into the lungs daily. 4 g 5  . tobramycin (TOBREX) 0.3 % ophthalmic solution Place 2 drops into the left eye every 6 (six) hours. 5 mL 0  . UNABLE TO FIND CPAP    . venlafaxine XR (EFFEXOR-XR) 37.5 MG 24 hr capsule TAKE 1 CAPSULE BY MOUTH ONCE DAILY WITH BREAKFAST 90 capsule 0  . vitamin C (ASCORBIC ACID) 500 MG tablet Take 500 mg by mouth every morning.      No current facility-administered medications on file prior to visit.     BP 115/60   Pulse (!) 107   Temp 97.7 F (36.5 C) (Oral)   Resp 16   Ht 6' (1.829 m)   Wt 240 lb (108.9 kg)   SpO2 96%   BMI 32.55 kg/m       Objective:   Physical Exam  General Mental Status- Alert. General Appearance- Not in acute distress.   Skin General: Color- Normal Color. Moisture- Normal Moisture. Large skin lesion on left side of neck. Larger than standard skin tag.(13mm x 3 mm). Pedunculated at the base.  Neck Carotid Arteries- Normal color. Moisture- Normal Moisture. No carotid bruits. No JVD.  Chest and Lung Exam Auscultation: Breath Sounds:-Normal.  Cardiovascular Auscultation:Rythm- Regular. Murmurs & Other Heart Sounds:Auscultation of the heart reveals- No Murmurs.  Abdomen Inspection:-Inspeection Normal. Palpation/Percussion:Note:No mass. Palpation and Percussion of the abdomen reveal- Non Tender, Non  Distended + BS, no rebound or guarding.   Neurologic Cranial Nerve exam:- CN III-XII intact(No nystagmus), symmetric smile. Strength:- 5/5 equal and symmetric strength both upper and lower extremities.  Left foot- bottom aspect distal 3rd metatarsal area has appearance on close inspection of plantar wart. Area is tender to palpation. No redness or warmth.      Assessment & Plan:  For your diabetes, want you to eat low sugar diet and continue current medications.  I am placing future CMP and A1c to be done tomorrow morning along with lipid panel.  Please schedule that on the way out.  For high cholesterol, continue current statin medication.  We will update you on the panel results when that is in.  For skin lesion on the left side of the neck as well as numerous skin lesions on your back, I do think it would be a good idea to refer you to dermatologist for skin cancer surveillance.  Hopefully they will be able to treat the lesion on the left side of your neck.  If not then please let me know.  For recent left foot pain shortly after breaking glass and potentially stepping on a piece, I am getting x-ray of your left foot.  If no glass seen then it is possible that you have a plantar wart as a cause of your pain.  Could consider cryo freeze over-the-counter treatment of wart potentially or direct referral to podiatrist for surgical procedure.  I do not think salicylic acid treatment is a good idea in light of your diabetes.  Flu vaccine given today.  Follow-up date to be determined after lab review.  [name], PA-C

## 2017-10-23 ENCOUNTER — Other Ambulatory Visit (INDEPENDENT_AMBULATORY_CARE_PROVIDER_SITE_OTHER): Payer: Medicare Other

## 2017-10-23 DIAGNOSIS — Z794 Long term (current) use of insulin: Secondary | ICD-10-CM

## 2017-10-23 DIAGNOSIS — E118 Type 2 diabetes mellitus with unspecified complications: Secondary | ICD-10-CM | POA: Diagnosis not present

## 2017-10-23 DIAGNOSIS — E785 Hyperlipidemia, unspecified: Secondary | ICD-10-CM | POA: Diagnosis not present

## 2017-10-23 LAB — COMPREHENSIVE METABOLIC PANEL
ALBUMIN: 4.1 g/dL (ref 3.5–5.2)
ALT: 18 U/L (ref 0–53)
AST: 15 U/L (ref 0–37)
Alkaline Phosphatase: 67 U/L (ref 39–117)
BUN: 18 mg/dL (ref 6–23)
CHLORIDE: 103 meq/L (ref 96–112)
CO2: 36 mEq/L — ABNORMAL HIGH (ref 19–32)
Calcium: 10.2 mg/dL (ref 8.4–10.5)
Creatinine, Ser: 1.09 mg/dL (ref 0.40–1.50)
GFR: 68.16 mL/min (ref >60.00)
Glucose, Bld: 124 mg/dL — ABNORMAL HIGH (ref 70–99)
Potassium: 4 mEq/L (ref 3.5–5.1)
Sodium: 142 mEq/L (ref 135–145)
Total Bilirubin: 0.5 mg/dL (ref 0.2–1.2)
Total Protein: 6.7 g/dL (ref 6.0–8.3)

## 2017-10-23 LAB — LIPID PANEL
CHOLESTEROL: 121 mg/dL (ref 0–200)
HDL: 39.1 mg/dL (ref >39.00)
LDL CALC: 61 mg/dL (ref 0–99)
NonHDL: 81.9
Total CHOL/HDL Ratio: 3
Triglycerides: 104 mg/dL (ref 0.0–149.0)
VLDL: 20.8 mg/dL (ref 0.0–40.0)

## 2017-10-23 LAB — HEMOGLOBIN A1C: HEMOGLOBIN A1C: 7 % — AB (ref 4.6–6.5)

## 2017-10-26 ENCOUNTER — Other Ambulatory Visit: Payer: Self-pay | Admitting: Medical

## 2017-10-27 ENCOUNTER — Other Ambulatory Visit: Payer: Self-pay | Admitting: Medical

## 2017-11-05 ENCOUNTER — Telehealth: Payer: Self-pay

## 2017-11-05 NOTE — Telephone Encounter (Signed)
I was reviewing the celebrex prescription and the dose appears to same on review. Woud you clarify with patient how had he been taking. I wrote him for prescription to take one day a day if needed

## 2017-11-05 NOTE — Telephone Encounter (Signed)
Copied from [place]. Topic: General - Other >> Nov 04, 2017  5:07 PM Mcneil, Ja-Kwan wrote: Reason for CRM: Pt states there was a change in the Rx for celecoxib (CELEBREX) 200 MG capsule and he would like to know if this will change how he takes the misoprostol (CYTOTEC) 100 MCG tablet and pioglitazone (ACTOS) 15 MG tablet. Pt requests call back. Cb# [phone]

## 2017-11-06 ENCOUNTER — Ambulatory Visit (INDEPENDENT_AMBULATORY_CARE_PROVIDER_SITE_OTHER): Payer: Medicare Other | Admitting: Emergency Medicine

## 2017-11-06 ENCOUNTER — Encounter: Payer: Self-pay | Admitting: Emergency Medicine

## 2017-11-06 DIAGNOSIS — J438 Other emphysema: Secondary | ICD-10-CM

## 2017-11-06 DIAGNOSIS — J9611 Chronic respiratory failure with hypoxia: Secondary | ICD-10-CM | POA: Diagnosis not present

## 2017-11-06 DIAGNOSIS — J9620 Acute and chronic respiratory failure, unspecified whether with hypoxia or hypercapnia: Secondary | ICD-10-CM | POA: Insufficient documentation

## 2017-11-06 DIAGNOSIS — G4733 Obstructive sleep apnea (adult) (pediatric): Secondary | ICD-10-CM | POA: Diagnosis not present

## 2017-11-06 DIAGNOSIS — J9610 Chronic respiratory failure, unspecified whether with hypoxia or hypercapnia: Secondary | ICD-10-CM | POA: Insufficient documentation

## 2017-11-06 NOTE — Assessment & Plan Note (Signed)
He did not feel that he benefited from [place], he is not using.  He does have albuterol that he uses about every other day.  He feels that this is adequate.  I will not start any scheduled bronchodilators at this time.  Flu shot is up-to-date

## 2017-11-06 NOTE — Progress Notes (Signed)
Subjective:    Patient ID: Lance Powell, male    DOB: 12/18/1929, 84 y.o.   MRN: [id number]  HPI 84 year old man with a history of former tobacco use (120 pack years), COPD and obstructive sleep apnea. He's been previously followed by Dr [name]. He is on O2 2L/min continuous.   ROV 11/04/16 -- this is a follow-up visit for patient with a history of COPD, obstructive sleep apnea on CPAP. He has hypoxemic respiratory failure. Has not benefited from scheduled bronchodilators so he only uses albuterol as needed.  He is is using 2L/min with exertion. He has great compliance > download shows  Usage on 100% of the days for the last month. Average usage was 11 hours. He wore CPAP greater than 4 hours 97% of the time. He states that he has significant clinical benefit, is less sleepy, has more daytime energy, has a more restful sleep. Stiolto is on his med list - he is not taking. Does not require albuterol. He coughs up some mucous each evening, white. No other issues.   ROV 11/06/17 --84 year old gentleman who we follow for COPD and obstructive sleep apnea on CPAP.  He also has hypoxemic respiratory failure and uses oxygen with exertion and often at rest. His CPAP compliance is very good based on download. Good clinical results - he is still awakened by his dog at night. He is not having dyspnea. He is not taking Stiolto. Rare albuterol use. He does cough up phlegm before bedtime. He uses albuterol about every other day.    No flowsheet data found.  Review of Systems As per HPI  Past Medical History:  Diagnosis Date  . COPD (chronic obstructive pulmonary disease) ([place])   . Depression 03/08/2014  . Diabetes mellitus   . GERD (gastroesophageal reflux disease) 03/08/2014  . History of blood transfusion   . Hyperlipemia   . Hypertension   . OSA (obstructive sleep apnea)   . Osteoarthritis   . Rheumatoid arthritis(714.0)   . Sleep apnea    cpap     Family History  Problem Relation Age of  Onset  . Heart disease Mother   . Emphysema Brother   . Cancer Daughter        breast     Social History   Socioeconomic History  . Marital status: Married    Spouse name: Not on file  . Number of children: 6  . Years of education: Not on file  . Highest education level: Not on file  Occupational History  . Occupation: Retired    Comment: Glass blower/designer  . Financial resource strain: Not on file  . Food insecurity:    Worry: Not on file    Inability: Not on file  . Transportation needs:    Medical: Not on file    Non-medical: Not on file  Tobacco Use  . Smoking status: Former Smoker    Packs/day: 4.00    Years: 30.00    Pack years: 120.00    Types: Cigarettes    Last attempt to quit: 01/21/1978    Years since quitting: 39.8  . Smokeless tobacco: Never Used  Substance and Sexual Activity  . Alcohol use: No  . Drug use: No  . Sexual activity: Not on file  Lifestyle  . Physical activity:    Days per week: Not on file    Minutes per session: Not on file  . Stress: Not on file  Relationships  . Social connections:  Talks on phone: Not on file    Gets together: Not on file    Attends religious service: Not on file    Active member of club or organization: Not on file    Attends meetings of clubs or organizations: Not on file    Relationship status: Not on file  . Intimate partner violence:    Fear of current or ex partner: Not on file    Emotionally abused: Not on file    Physically abused: Not on file    Forced sexual activity: Not on file  Other Topics Concern  . Not on file  Social History Narrative  . 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 albuterol (PROVENTIL HFA;VENTOLIN HFA) 108 (90 Base) MCG/ACT inhaler Inhale 2 puffs into the lungs every 6 (six) hours as needed for  wheezing or shortness of breath. 1 Inhaler 2  . aspirin 81 MG tablet Take 1 tablet (81 mg total) by mouth every morning. Stop for 1 week and then resume (Patient taking differently: Take 81 mg by mouth every morning. )    . atorvastatin (LIPITOR) 40 MG tablet TAKE 1 TABLET BY MOUTH ONCE DAILY IN THE MORNING 90 tablet 0  . atorvastatin (LIPITOR) 40 MG tablet TAKE 1 TABLET BY MOUTH ONCE DAILY IN THE MORNING 90 tablet 0  . celecoxib (CELEBREX) 200 MG capsule TAKE 1 CAPSULE BY MOUTH ONCE DAILY IN THE EVENING 30 capsule 1  . cholecalciferol (VITAMIN D) 1000 UNITS tablet Take 2,000 Units by mouth 2 (two) times daily.     . clonazePAM (KLONOPIN) 0.25 MG disintegrating tablet 1 tab po q hs prn insomnia or anxiety 10 tablet 2  . enalapril (VASOTEC) 2.5 MG tablet Take 2.5 mg by mouth every morning. Reported on 07/12/2015    . glucose blood (BAYER CONTOUR TEST) test strip Use as instructed to check blood sugar twice a day.  DX  E11.9 100 each 5  . Indacaterol Maleate (ARCAPTA NEOHALER) 75 MCG CAPS Place 1 capsule into inhaler and inhale every morning. 90 capsule 1  . ketoconazole (NIZORAL) 2 % cream As  Directed.  0  . ketoconazole (NIZORAL) 2 % shampoo As directed.  0  . lidocaine (LIDODERM) 5 % Place 1 patch onto the skin daily. Remove & Discard patch within 12 hours or as directed by MD 30 patch 0  . meclizine (ANTIVERT) 12.5 MG tablet Take 1 tablet (12.5 mg total) by mouth 3 (three) times daily as needed for dizziness. 30 tablet 0  . metFORMIN (GLUCOPHAGE) 500 MG tablet Take 1 tablet (500 mg total) by mouth 3 (three) times daily. 270 tablet 0  . misoprostol (CYTOTEC) 100 MCG tablet TAKE 1 TABLET BY MOUTH ONCE DAILY IN THE MORNING AND TAKE 1 TABLET IN THE EVENING AND 2 TABLETS AT BEDTIME 120 tablet 1  . Multiple Vitamins-Minerals (CENTRUM) tablet Take 1 tablet by mouth every morning. Reported on 01/05/2015    . OVER THE COUNTER MEDICATION Place 1-2 drops into both eyes daily as needed (dry red eyes.).  Reported on 01/05/2015    . pioglitazone (ACTOS) 15 MG tablet TAKE 1 TABLET BY MOUTH ONCE DAILY 90 tablet 0  . Tiotropium Bromide-Olodaterol (STIOLTO RESPIMAT) 2.5-2.5 MCG/ACT AERS Inhale 2 puffs into the lungs daily. 4 g 5  . tobramycin (TOBREX) 0.3 %  ophthalmic solution Place 2 drops into the left eye every 6 (six) hours. 5 mL 0  . UNABLE TO FIND CPAP    . venlafaxine XR (EFFEXOR-XR) 37.5 MG 24 hr capsule TAKE 1 CAPSULE BY MOUTH ONCE DAILY WITH BREAKFAST 90 capsule 0  . vitamin C (ASCORBIC ACID) 500 MG tablet Take 500 mg by mouth every morning.      No facility-administered medications prior to visit.          Objective:   Physical Exam Vitals:   11/06/17 1334  BP: 134/80  Pulse: (!) 130  SpO2: 94%  Weight: 107.5 kg  Height: 6' (1.829 m)   Gen: Pleasant, overwt man, in no distress,  normal affect  ENT: No lesions,  mouth clear,  oropharynx clear, no postnasal drip  Neck: No JVD, no stridor  Lungs: No use of accessory muscles,  clear without rales or rhonchi  Cardiovascular: RRR, heart sounds normal, no murmur or gallops, no peripheral edema  Musculoskeletal: No deformities, no cyanosis or clubbing  Neuro: alert, non focal  Skin: Warm, no lesions or rashes      Assessment & Plan:  COPD (chronic obstructive pulmonary disease) He did not feel that he benefited from [place], he is not using.  He does have albuterol that he uses about every other day.  He feels that this is adequate.  I will not start any scheduled bronchodilators at this time.  Flu shot is up-to-date  Obstructive sleep apnea Great compliance with his CPAP.  He does need oxygen bled into the device and that is why has a home concentrator.  Apparently this is a barrier to him getting a portable concentrator to use for his COPD.  We will try to investigate.  Chronic respiratory failure (HCC) Chronic hypoxemic respiratory failure.  He uses 2 L/min with exertion and often at rest.  He also uses it for  sleep.  He wants a portable oxygen concentrator and I support this.  Apparently his insurance does not want him to have a portable concentrator as well as a home concentrator.  He cannot give up the home concentrator because he needs continuous flow into his CPAP device.  We will speak with [facility] and see if there is a solution to this problem.  [name], MD, PhD 11/06/2017, 1:59 PM  Pulmonary and Critical Care [phone] or if no answer [phone]

## 2017-11-06 NOTE — Assessment & Plan Note (Signed)
Chronic hypoxemic respiratory failure.  He uses 2 L/min with exertion and often at rest.  He also uses it for sleep.  He wants a portable oxygen concentrator and I support this.  Apparently his insurance does not want him to have a portable concentrator as well as a home concentrator.  He cannot give up the home concentrator because he needs continuous flow into his CPAP device.  We will speak with [facility] and see if there is a solution to this problem.

## 2017-11-06 NOTE — Assessment & Plan Note (Signed)
Great compliance with his CPAP.  He does need oxygen bled into the device and that is why has a home concentrator.  Apparently this is a barrier to him getting a portable concentrator to use for his COPD.  We will try to investigate.

## 2017-11-06 NOTE — Patient Instructions (Signed)
Keep your albuterol available use 2 puffs up to every 4 hours if needed for shortness of breath, chest tightness, wheezing. Wear your oxygen at 2 L/min with exertion and while sleeping.  We will investigate whether any options for obtaining a portable concentrator while at the same time keeping your home concentrator to bleed into your CPAP. Wear your CPAP every night as you have been doing.  Your compliance is excellent on today's download. Flu shot is up-to-date. Follow with Dr Lamonte Sakai in 12 months or sooner if you have any problems

## 2017-11-07 NOTE — Telephone Encounter (Signed)
Left pt a message to call back. [place] for [facility] to give information.

## 2017-11-07 NOTE — Telephone Encounter (Signed)
Can take cytotec on days he takes celebrex. Pt a1c was good the other day. If he has been taking actos then continue. No change to diabetic regimen.

## 2017-11-07 NOTE — Telephone Encounter (Signed)
Pt returned call and said he is taking celebrex 1x every 3 days as he was advised by [name], PA. He is wanting to know if this would change how he takes cytotec and actos. Pt requesting call back

## 2017-11-07 NOTE — Telephone Encounter (Signed)
Chief Strategy Officer relayed [employer]. Pt. Stated was was going to continue doing celebrex every 3 days anyway since that seems to work for him. Pt. Is aware that there are no contraindications to taking actos, cytotec and celebrex together.

## 2017-11-20 DIAGNOSIS — M722 Plantar fascial fibromatosis: Secondary | ICD-10-CM | POA: Diagnosis not present

## 2017-11-20 DIAGNOSIS — M1611 Unilateral primary osteoarthritis, right hip: Secondary | ICD-10-CM | POA: Diagnosis not present

## 2017-11-25 ENCOUNTER — Other Ambulatory Visit: Payer: Self-pay | Admitting: Medical

## 2017-11-27 ENCOUNTER — Encounter: Payer: Self-pay | Admitting: Podiatry

## 2017-11-27 ENCOUNTER — Ambulatory Visit: Payer: Medicare Other | Admitting: Podiatry

## 2017-11-27 VITALS — BP 130/84 | HR 120

## 2017-11-27 DIAGNOSIS — M2041 Other hammer toe(s) (acquired), right foot: Secondary | ICD-10-CM | POA: Diagnosis not present

## 2017-11-27 DIAGNOSIS — E119 Type 2 diabetes mellitus without complications: Secondary | ICD-10-CM

## 2017-11-27 DIAGNOSIS — E1151 Type 2 diabetes mellitus with diabetic peripheral angiopathy without gangrene: Secondary | ICD-10-CM

## 2017-11-27 DIAGNOSIS — L84 Corns and callosities: Secondary | ICD-10-CM

## 2017-11-27 DIAGNOSIS — M2042 Other hammer toe(s) (acquired), left foot: Secondary | ICD-10-CM

## 2017-11-27 NOTE — Progress Notes (Signed)
Subjective:  Patient ID: Lance Powell, male    DOB: [birth year],  MRN: [id number]  Chief Complaint  Patient presents with  . Diabetes    diabetic foot exam  . Plantar Warts    left 3rd metatarsal    84 y.o. male presents  for diabetic foot care. Last AMBS was unknown. Reports numbness and tingling in their feet. Denies cramping in legs and thighs.  Review of Systems: Negative except as noted in the HPI. Denies N/V/F/Ch.  Past Medical History:  Diagnosis Date  . COPD (chronic obstructive pulmonary disease) ([place])   . Depression 03/08/2014  . Diabetes mellitus   . GERD (gastroesophageal reflux disease) 03/08/2014  . History of blood transfusion   . Hyperlipemia   . Hypertension   . OSA (obstructive sleep apnea)   . Osteoarthritis   . Rheumatoid arthritis(714.0)   . Sleep apnea    cpap    Current Outpatient Medications:  .  albuterol (PROVENTIL HFA;VENTOLIN HFA) 108 (90 Base) MCG/ACT inhaler, Inhale 2 puffs into the lungs every 6 (six) hours as needed for wheezing or shortness of breath., Disp: 1 Inhaler, Rfl: 2 .  aspirin 81 MG tablet, Take 1 tablet (81 mg total) by mouth every morning. Stop for 1 week and then resume (Patient taking differently: Take 81 mg by mouth every morning. ), Disp: , Rfl:  .  atorvastatin (LIPITOR) 40 MG tablet, TAKE 1 TABLET BY MOUTH ONCE DAILY IN THE MORNING, Disp: 90 tablet, Rfl: 0 .  atorvastatin (LIPITOR) 40 MG tablet, TAKE 1 TABLET BY MOUTH ONCE DAILY IN THE MORNING, Disp: 90 tablet, Rfl: 0 .  celecoxib (CELEBREX) 200 MG capsule, TAKE 1 CAPSULE BY MOUTH ONCE DAILY IN THE EVENING, Disp: 30 capsule, Rfl: 1 .  cholecalciferol (VITAMIN D) 1000 UNITS tablet, Take 2,000 Units by mouth 2 (two) times daily. , Disp: , Rfl:  .  clonazePAM (KLONOPIN) 0.25 MG disintegrating tablet, 1 tab po q hs prn insomnia or anxiety, Disp: 10 tablet, Rfl: 2 .  enalapril (VASOTEC) 2.5 MG tablet, Take 2.5 mg by mouth every morning. Reported on 07/12/2015, Disp: , Rfl:  .   glucose blood (BAYER CONTOUR TEST) test strip, Use as instructed to check blood sugar twice a day.  DX  E11.9, Disp: 100 each, Rfl: 5 .  Indacaterol Maleate (ARCAPTA NEOHALER) 75 MCG CAPS, Place 1 capsule into inhaler and inhale every morning., Disp: 90 capsule, Rfl: 1 .  ketoconazole (NIZORAL) 2 % cream, As  Directed., Disp: , Rfl: 0 .  ketoconazole (NIZORAL) 2 % shampoo, As directed., Disp: , Rfl: 0 .  lidocaine (LIDODERM) 5 %, Place 1 patch onto the skin daily. Remove & Discard patch within 12 hours or as directed by MD, Disp: 30 patch, Rfl: 0 .  meclizine (ANTIVERT) 12.5 MG tablet, Take 1 tablet (12.5 mg total) by mouth 3 (three) times daily as needed for dizziness., Disp: 30 tablet, Rfl: 0 .  metFORMIN (GLUCOPHAGE) 500 MG tablet, Take 1 tablet (500 mg total) by mouth 3 (three) times daily., Disp: 270 tablet, Rfl: 0 .  misoprostol (CYTOTEC) 100 MCG tablet, TAKE 1 TABLET BY MOUTH IN THE MORNING, 1 TABLET IN THE EVENING, AND 2  TABLETS AT BEDTIME, Disp: 120 tablet, Rfl: 1 .  Multiple Vitamins-Minerals (CENTRUM) tablet, Take 1 tablet by mouth every morning. Reported on 01/05/2015, Disp: , Rfl:  .  OVER THE COUNTER MEDICATION, Place 1-2 drops into both eyes daily as needed (dry red eyes.). Reported on 01/05/2015, Disp: ,  Rfl:  .  pioglitazone (ACTOS) 15 MG tablet, TAKE 1 TABLET BY MOUTH ONCE DAILY, Disp: 90 tablet, Rfl: 0 .  Tiotropium Bromide-Olodaterol (STIOLTO RESPIMAT) 2.5-2.5 MCG/ACT AERS, Inhale 2 puffs into the lungs daily., Disp: 4 g, Rfl: 5 .  tobramycin (TOBREX) 0.3 % ophthalmic solution, Place 2 drops into the left eye every 6 (six) hours., Disp: 5 mL, Rfl: 0 .  UNABLE TO FIND, CPAP, Disp: , Rfl:  .  venlafaxine XR (EFFEXOR-XR) 37.5 MG 24 hr capsule, TAKE 1 CAPSULE BY MOUTH ONCE DAILY WITH BREAKFAST, Disp: 90 capsule, Rfl: 0 .  vitamin C (ASCORBIC ACID) 500 MG tablet, Take 500 mg by mouth every morning. , Disp: , Rfl:   Social History   Tobacco Use  Smoking Status Former Smoker  .  Packs/day: 4.00  . Years: 30.00  . Pack years: 120.00  . Types: Cigarettes  . Last attempt to quit: 01/21/1978  . Years since quitting: 39.8  Smokeless Tobacco Never Used    No Known Allergies Objective:   Vitals:   11/27/17 1053  BP: 130/84  Pulse: (!) 120   There is no height or weight on file to calculate BMI. Constitutional Well developed. Well nourished.  Vascular Dorsalis pedis pulses present 1+ bilaterally  Posterior tibial pulses absent bilaterally  Pedal hair growth absent. Capillary refill normal to all digits.  No cyanosis or clubbing noted.  Neurologic Normal speech. Oriented to person, place, and time. Epicritic sensation to light touch grossly present bilaterally. Protective sensation with 5.07 monofilament  present bilaterally. Vibratory sensation present bilaterally.  Dermatologic Nails elongated, thickened, dystrophic. Pre-ulcerative callus L 2nd metatarsal No skin lesions.  Orthopedic: Normal joint ROM without pain or crepitus bilaterally. Hammertoe deformities bilat No bony tenderness.   Assessment:   1. Encounter for diabetic foot exam ([place])   2. Diabetes mellitus type 2 with peripheral artery disease ([place])   3. Onychomycosis    Plan:  Patient was evaluated and treated and all questions answered.  Diabetes with PAD, Onychomycosis -Educated on diabetic footcare. Diabetic risk level 1 -Callus debrided with 15 blade without incident  Procedure: Paring of Lesion Rationale: painful hyperkeratotic lesion Type of Debridement: manual, sharp debridement. Instrumentation: 312/15 blade Number of Lesions: 1   Hammertoes -Would benefit from DM shoes will make appt for fabrication.  Return in about 3 months (around 02/27/2018) for Diabetic Foot Care.

## 2017-11-27 NOTE — Patient Instructions (Signed)

## 2017-12-11 ENCOUNTER — Telehealth: Payer: Self-pay | Admitting: Medical

## 2017-12-11 ENCOUNTER — Ambulatory Visit: Payer: Medicare Other | Admitting: Orthotics

## 2017-12-11 DIAGNOSIS — E119 Type 2 diabetes mellitus without complications: Secondary | ICD-10-CM

## 2017-12-11 DIAGNOSIS — M2041 Other hammer toe(s) (acquired), right foot: Secondary | ICD-10-CM

## 2017-12-11 DIAGNOSIS — L84 Corns and callosities: Secondary | ICD-10-CM

## 2017-12-11 DIAGNOSIS — M2042 Other hammer toe(s) (acquired), left foot: Secondary | ICD-10-CM

## 2017-12-11 DIAGNOSIS — E1151 Type 2 diabetes mellitus with diabetic peripheral angiopathy without gangrene: Secondary | ICD-10-CM

## 2017-12-11 NOTE — Patient Instructions (Signed)
Pressure calluses from the bones putting pressure on the skin; offloading by customized full contact inserts.

## 2017-12-11 NOTE — Telephone Encounter (Signed)
Patient came into the office today explaining that in order to get diabetic shoes, approved by Medicare, he needed an MD to sign off on the order. Please advise.

## 2017-12-14 NOTE — Telephone Encounter (Signed)
Does pt have a form for provider to fill out. Usually refer to podiatrist to fill out as has to be filled out in correct manner and feet have to meet criteria with his diabetes. So want to know he has form first. Then will refer.  Have him update [place] if has form or not,

## 2017-12-15 ENCOUNTER — Other Ambulatory Visit: Payer: Self-pay | Admitting: Medical

## 2017-12-29 DIAGNOSIS — H2513 Age-related nuclear cataract, bilateral: Secondary | ICD-10-CM | POA: Diagnosis not present

## 2017-12-29 DIAGNOSIS — H25013 Cortical age-related cataract, bilateral: Secondary | ICD-10-CM | POA: Diagnosis not present

## 2017-12-29 DIAGNOSIS — Z7984 Long term (current) use of oral hypoglycemic drugs: Secondary | ICD-10-CM | POA: Diagnosis not present

## 2017-12-29 DIAGNOSIS — E119 Type 2 diabetes mellitus without complications: Secondary | ICD-10-CM | POA: Diagnosis not present

## 2017-12-29 LAB — HM DIABETES EYE EXAM

## 2017-12-30 ENCOUNTER — Other Ambulatory Visit: Payer: Self-pay | Admitting: Medical

## 2018-01-28 DIAGNOSIS — H25011 Cortical age-related cataract, right eye: Secondary | ICD-10-CM | POA: Diagnosis not present

## 2018-01-28 DIAGNOSIS — H25012 Cortical age-related cataract, left eye: Secondary | ICD-10-CM | POA: Diagnosis not present

## 2018-01-28 DIAGNOSIS — H2512 Age-related nuclear cataract, left eye: Secondary | ICD-10-CM | POA: Diagnosis not present

## 2018-01-28 DIAGNOSIS — H2511 Age-related nuclear cataract, right eye: Secondary | ICD-10-CM | POA: Diagnosis not present

## 2018-01-30 DIAGNOSIS — G4733 Obstructive sleep apnea (adult) (pediatric): Secondary | ICD-10-CM | POA: Diagnosis not present

## 2018-02-02 ENCOUNTER — Other Ambulatory Visit: Payer: Self-pay | Admitting: Medical

## 2018-02-04 DIAGNOSIS — H2512 Age-related nuclear cataract, left eye: Secondary | ICD-10-CM | POA: Diagnosis not present

## 2018-02-04 DIAGNOSIS — H25012 Cortical age-related cataract, left eye: Secondary | ICD-10-CM | POA: Diagnosis not present

## 2018-02-05 ENCOUNTER — Telehealth: Payer: Self-pay

## 2018-02-05 NOTE — Telephone Encounter (Signed)
Copied from [place]. Topic: Appointment Scheduling - Scheduling Inquiry for Clinic >> Feb 05, 2018  9:52 AM Alanda Slim E wrote: Reason for CRM: Pt just had eye surgery and is currently expirencing a cough with flem and a sore throat. He is concerned about getting an infection in his eye. He wants to be seen by Dr. Harvie Heck today. I advised Pt of availability but he insisted on me sending an emergency note to Dr. Harvie Heck to see if he can be squeezed in by the Dr. Abbott Pao asked for a call back about this inquiry. Other than that I scheduled Pt for an appt tomorrow with Dr. Harvie Heck./ please advise

## 2018-02-05 NOTE — Telephone Encounter (Signed)
I am just now seeing this after leaving the office. Explain can see him tomorrow as he is already scheduled.

## 2018-02-06 ENCOUNTER — Ambulatory Visit: Payer: Medicare Other | Admitting: Medical

## 2018-02-06 ENCOUNTER — Ambulatory Visit (HOSPITAL_BASED_OUTPATIENT_CLINIC_OR_DEPARTMENT_OTHER)
Admission: RE | Admit: 2018-02-06 | Discharge: 2018-02-06 | Disposition: A | Discharge status: Home / Self Care | Payer: Medicare Other | Source: Ambulatory Visit | Attending: Medical | Admitting: Medical

## 2018-02-06 ENCOUNTER — Encounter: Payer: Self-pay | Admitting: Medical

## 2018-02-06 VITALS — BP 130/70 | HR 110 | Temp 98.3°F | Resp 16 | Ht 72.0 in | Wt 240.0 lb

## 2018-02-06 DIAGNOSIS — J449 Chronic obstructive pulmonary disease, unspecified: Secondary | ICD-10-CM

## 2018-02-06 DIAGNOSIS — R0781 Pleurodynia: Secondary | ICD-10-CM | POA: Diagnosis not present

## 2018-02-06 DIAGNOSIS — R05 Cough: Secondary | ICD-10-CM | POA: Insufficient documentation

## 2018-02-06 DIAGNOSIS — R062 Wheezing: Secondary | ICD-10-CM

## 2018-02-06 DIAGNOSIS — J40 Bronchitis, not specified as acute or chronic: Secondary | ICD-10-CM

## 2018-02-06 DIAGNOSIS — R490 Dysphonia: Secondary | ICD-10-CM

## 2018-02-06 DIAGNOSIS — R059 Cough, unspecified: Secondary | ICD-10-CM

## 2018-02-06 LAB — CBC WITH DIFFERENTIAL/PLATELET
Basophils Absolute: 0.1 10*3/uL (ref 0.0–0.1)
Basophils Relative: 0.3 % (ref 0.0–3.0)
EOS ABS: 0.4 10*3/uL (ref 0.0–0.7)
EOS PCT: 2.6 % (ref 0.0–5.0)
HCT: 45.7 % (ref 39.0–52.0)
HEMOGLOBIN: 14.9 g/dL (ref 13.0–17.0)
Lymphocytes Relative: 16 % (ref 12.0–46.0)
Lymphs Abs: 2.7 10*3/uL (ref 0.7–4.0)
MCHC: 32.7 g/dL (ref 30.0–36.0)
MCV: 97.2 fl (ref 78.0–100.0)
MONO ABS: 1.9 10*3/uL — AB (ref 0.1–1.0)
Monocytes Relative: 11.6 % (ref 3.0–12.0)
NEUTROS PCT: 69.5 % (ref 43.0–77.0)
Neutro Abs: 11.5 10*3/uL — ABNORMAL HIGH (ref 1.4–7.7)
Platelets: 217 10*3/uL (ref 150.0–400.0)
RBC: 4.7 Mil/uL (ref 4.22–5.81)
RDW: 14.5 % (ref 11.5–15.5)
WBC: 16.6 10*3/uL — AB (ref 4.0–10.5)

## 2018-02-06 MED ORDER — LEVOFLOXACIN 500 MG PO TABS: 500.0000 mg | ORAL_TABLET | Freq: Every day | ORAL | 0 refills | Status: DC

## 2018-02-06 MED ORDER — BENZONATATE 100 MG PO CAPS: 100.0000 mg | ORAL_CAPSULE | Freq: Three times a day (TID) | ORAL | 0 refills | Status: DC | PRN

## 2018-02-06 MED ORDER — FLUTICASONE PROPIONATE HFA 110 MCG/ACT IN AERO: 2.0000 | INHALATION_SPRAY | Freq: Two times a day (BID) | RESPIRATORY_TRACT | 0 refills | Status: DC

## 2018-02-06 MED ORDER — CEFTRIAXONE SODIUM 1 G IJ SOLR
1.0000 g | Freq: Once | INTRAMUSCULAR | Status: AC
  Administered 2018-02-06: 1 g via INTRAMUSCULAR

## 2018-02-06 NOTE — Patient Instructions (Addendum)
You do appear to have  bronchitis versus pneumonia.  You do have history of COPD so do want to proceed with caution.  Some sore throat recently so we did a rapid strep.  That test was negative.  Also I did a flu test and that test was negative as well.  We gave you Rocephin 1 g IM injection in office.  I am prescribing benzonatate for cough.  After xray back will decide if will rx doxycyline or levofloxin.  For wheezing I want you to continue with your albuterol and use if needed.  If you are having to use albuterol frequently then go ahead and add Flovent.  Continue with your oxygen.  If by Monday you report wheezing is worsening then might have to consider a taper dose of prednisone.  This would increase your sugar but sugars have been well controlled recently.  Please get chest x-ray now and a CBC.  If you have worsening signs symptoms over the weekend then recommend ED evaluation.  Follow-up in 5 to 7 days or as needed.

## 2018-02-06 NOTE — Telephone Encounter (Signed)
Rx levofloxin sent to pt pharmacy.

## 2018-02-06 NOTE — Progress Notes (Signed)
Subjective:    Patient ID: Lance Powell, male    DOB: 1929/02/13, 85 y.o.   MRN: [id number]  HPI  Pt in for st, hoarse voicen productive cough, rib pain from coughing and some wheezing x 2 days.(recent cataract surgery)  Rib pain when coughs aggressivley. He doe show me thick dark yellow/brown mucus when he coughs.   No fever, no chills or sweats. No body aches.   Wheezing is moderate and intermittent. When he cough up mucus states wheezing is cleared. Pt has copd and he is on oxygen.  Pt sugars have been 105-115 recently.   Review of Systems  Constitutional: Positive for fatigue. Negative for chills and fever.  HENT: Positive for sore throat. Negative for congestion, postnasal drip, sinus pressure and sinus pain.   Respiratory: Positive for wheezing. Negative for choking and chest tightness.        Thick productive cough.  Cardiovascular: Negative for chest pain and palpitations.  Gastrointestinal: Negative for abdominal pain.  Musculoskeletal: Negative for back pain, joint swelling, neck pain and neck stiffness.  Neurological: Negative for dizziness, seizures, syncope, facial asymmetry, weakness and light-headedness.  Hematological: Negative for adenopathy. Does not bruise/bleed easily.   Past Medical History:  Diagnosis Date  . COPD (chronic obstructive pulmonary disease) ([place])   . Depression 03/08/2014  . Diabetes mellitus   . GERD (gastroesophageal reflux disease) 03/08/2014  . History of blood transfusion   . Hyperlipemia   . Hypertension   . OSA (obstructive sleep apnea)   . Osteoarthritis   . Rheumatoid arthritis(714.0)   . Sleep apnea    cpap     Social History   Socioeconomic History  . Marital status: Married    Spouse name: Not on file  . Number of children: 6  . Years of education: Not on file  . Highest education level: Not on file  Occupational History  . Occupation: Retired    Comment: Glass blower/designer  . Financial resource strain:  Not on file  . Food insecurity:    Worry: Not on file    Inability: Not on file  . Transportation needs:    Medical: Not on file    Non-medical: Not on file  Tobacco Use  . Smoking status: Former Smoker    Packs/day: 4.00    Years: 30.00    Pack years: 120.00    Types: Cigarettes    Last attempt to quit: 01/21/1978    Years since quitting: 40.0  . Smokeless tobacco: Never Used  Substance and Sexual Activity  . Alcohol use: No  . Drug use: No  . Sexual activity: Not on file  Lifestyle  . Physical activity:    Days per week: Not on file    Minutes per session: Not on file  . Stress: Not on file  Relationships  . Social connections:    Talks on phone: Not on file    Gets together: Not on file    Attends religious service: Not on file    Active member of club or organization: Not on file    Attends meetings of clubs or organizations: Not on file    Relationship status: Not on file  . Intimate partner violence:    Fear of current or ex partner: Not on file    Emotionally abused: Not on file    Physically abused: Not on file    Forced sexual activity: Not on file  Other Topics Concern  . Not on file  Social History Narrative  . Not on file    Past Surgical History:  Procedure Laterality Date  . APPENDECTOMY  1969  . CHOLECYSTECTOMY N/A 06/16/2014   Procedure: LAPAROSCOPIC CHOLECYSTECTOMY;  Surgeon: Greer Pickerel, MD;  Location: [facility];  Service: General;  Laterality: N/A;  . CYSTOSCOPY  01/23/2011   Procedure: CYSTOSCOPY;  Surgeon: Molli Hazard, MD;  Location: [facility];  Service: Urology;  Laterality: N/A;  . FLEXIBLE SIGMOIDOSCOPY  01/07/2012   Procedure: FLEXIBLE SIGMOIDOSCOPY;  Surgeon: Ladene Artist, MD,FACG;  Location: [facility];  Service: Endoscopy;  Laterality: N/A;  . HERNIA REPAIR  1990   right and left inguinal  . NISSEN FUNDOPLICATION    . 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 TONSILLECTOMY  1937  . TRANSURETHRAL  RESECTION OF PROSTATE  01/23/2011   Procedure: TRANSURETHRAL RESECTION OF THE PROSTATE WITH GYRUS INSTRUMENTS;  Surgeon: Molli Hazard, MD;  Location: [facility];  Service: Urology;  Laterality: N/A;  . VASECTOMY  1972    Family History  Problem Relation Age of Onset  . Heart disease Mother   . Emphysema Brother   . Cancer Daughter        breast    No Known Allergies  Current Outpatient Medications on File Prior to Visit  Medication Sig Dispense Refill  . albuterol (PROVENTIL HFA;VENTOLIN HFA) 108 (90 Base) MCG/ACT inhaler Inhale 2 puffs into the lungs every 6 (six) hours as needed for wheezing or shortness of breath. 1 Inhaler 2  . aspirin 81 MG tablet Take 1 tablet (81 mg total) by mouth every morning. Stop for 1 week and then resume (Patient taking differently: Take 81 mg by mouth every morning. )    . atorvastatin (LIPITOR) 40 MG tablet TAKE 1 TABLET BY MOUTH ONCE DAILY IN THE MORNING 90 tablet 0  . atorvastatin (LIPITOR) 40 MG tablet TAKE 1 TABLET BY MOUTH ONCE DAILY IN THE MORNING 90 tablet 0  . celecoxib (CELEBREX) 200 MG capsule TAKE 1 CAPSULE BY MOUTH ONCE DAILY IN THE EVENING 30 capsule 1  . cholecalciferol (VITAMIN D) 1000 UNITS tablet Take 2,000 Units by mouth 2 (two) times daily.     . clonazePAM (KLONOPIN) 0.25 MG disintegrating tablet 1 tab po q hs prn insomnia or anxiety 10 tablet 2  . Difluprednate 0.05 % EMUL One drop in operative eye BID starting 2 days prior to surgery and after surgery for 3 weeks    . DUREZOL 0.05 % EMUL INSTILL 1 DROP TWICE DAILY INTO OPERATIVE EYE STARTING 2 DAYS PRIOR TO SURGERY AND THEN AFTER SURGERY FOR 3 WEEKS    . enalapril (VASOTEC) 2.5 MG tablet Take 2.5 mg by mouth every morning. Reported on 07/12/2015    . glucose blood (BAYER CONTOUR TEST) test strip Use as instructed to check blood sugar twice a day.  DX  E11.9 100 each 5  . Indacaterol Maleate (ARCAPTA NEOHALER) 75 MCG CAPS Place 1 capsule into inhaler and inhale  every morning. 90 capsule 1  . ketoconazole (NIZORAL) 2 % cream As  Directed.  0  . ketoconazole (NIZORAL) 2 % shampoo As directed.  0  . lidocaine (LIDODERM) 5 % Place 1 patch onto the skin daily. Remove & Discard patch within 12 hours or as directed by MD 30 patch 0  . meclizine (ANTIVERT) 12.5 MG tablet Take 1 tablet (12.5 mg total) by mouth 3 (three) times daily as needed for dizziness. 30 tablet 0  .  metFORMIN (GLUCOPHAGE) 500 MG tablet Take 1 tablet (500 mg total) by mouth 3 (three) times daily. 270 tablet 0  . metFORMIN (GLUCOPHAGE) 500 MG tablet TAKE 1 TABLET BY MOUTH THREE TIMES DAILY 90 tablet 1  . misoprostol (CYTOTEC) 100 MCG tablet TAKE 1 TABLET BY MOUTH IN THE MORNING AND 1 IN THE EVENING AND 2 AT BEDTIME 120 tablet 0  . Multiple Vitamins-Minerals (CENTRUM) tablet Take 1 tablet by mouth every morning. Reported on 01/05/2015    . ofloxacin (OCUFLOX) 0.3 % ophthalmic solution INSTILL 1 DROP TWICE DAILY INTO OPERATIVE EYE STARTING 2 DAYS PRIOR TO SURGERY AND AFTER SURGERY FOR 3 WEEKS    . ofloxacin (OCUFLOX) 0.3 % ophthalmic solution Instill 1 drop BID in operative eye starting 2 days prior to surgery and after surgery for 3 weeks.    Marland Kitchen OVER THE COUNTER MEDICATION Place 1-2 drops into both eyes daily as needed (dry red eyes.). Reported on 01/05/2015    . pioglitazone (ACTOS) 15 MG tablet TAKE 1 TABLET BY MOUTH ONCE DAILY 90 tablet 0  . Tiotropium Bromide-Olodaterol (STIOLTO RESPIMAT) 2.5-2.5 MCG/ACT AERS Inhale 2 puffs into the lungs daily. 4 g 5  . tobramycin (TOBREX) 0.3 % ophthalmic solution Place 2 drops into the left eye every 6 (six) hours. 5 mL 0  . UNABLE TO FIND CPAP    . venlafaxine XR (EFFEXOR-XR) 37.5 MG 24 hr capsule TAKE 1 CAPSULE BY MOUTH ONCE DAILY WITH BREAKFAST 90 capsule 0  . vitamin C (ASCORBIC ACID) 500 MG tablet Take 500 mg by mouth every morning.      No current facility-administered medications on file prior to visit.     BP 130/70   Pulse (!) 110   Temp  98.3 F (36.8 C) (Oral)   Resp 16   Ht 6' (1.829 m)   Wt 240 lb (108.9 kg)   SpO2 93% Comment: when MA checked was 97%  BMI 32.55 kg/m        Objective:   Physical Exam  General  Mental Status - Alert. General Appearance - Well groomed. Not in acute distress.  Skin Rashes- No Rashes.  HEENT Head- Normal. Ear Auditory Canal - Left- Normal. Right - Normal.Tympanic Membrane- Left- Normal. Right- Normal. Eye Sclera/Conjunctiva- Left- Normal. Right- Normal. Nose & Sinuses Nasal Mucosa- Left-  Boggy and Congested. Right-  Boggy and  Congested.Bilateral  No maxillary and no  frontal sinus pressure. Mouth & Throat Lips: Upper Lip- Normal: no dryness, cracking, pallor, cyanosis, or vesicular eruption. Lower Lip-Normal: no dryness, cracking, pallor, cyanosis or vesicular eruption. Buccal Mucosa- Bilateral- No Aphthous ulcers. Oropharynx- No Discharge or Erythema. Tonsils: Characteristics- Bilateral- No Erythema or Congestion. Size/Enlargement- Bilateral- No enlargement. Discharge- bilateral-None.  Neck Neck- Supple. No Masses.   Chest and Lung Exam Auscultation: Breath Sounds:- even and unlabored but shallow(pt coughs on exam and shows me thick colored mucus)  Cardiovascular Auscultation:Rythm- Regular, rate and rhythm. Murmurs & Other Heart Sounds:Ausculatation of the heart reveal- No Murmurs.  Lymphatic Head & Neck General Head & Neck Lymphatics: Bilateral: Description- No Localized lymphadenopathy.  Lower ext- no pedal edema. Calf symmetric.       Assessment & Plan:  You do appear to have  bronchitis versus pneumonia.  You do have history of COPD so do want to proceed with caution.  Some sore throat recently so we did a rapid strep.  That test was negative.  Also I did a flu test and that test was negative as well.  We  gave you Rocephin 1 g IM injection in office.  I am prescribing benzonatate for cough.  After xray back will decide if will rx doxycyline or  levofloxin.  For wheezing I want you to continue with your albuterol and use if needed.  If you are having to use albuterol frequently then go ahead and add Flovent.  Continue with your oxygen.  If by Monday you report wheezing is worsening then might have to consider a taper dose of prednisone.  This would increase your sugar but sugars have been well controlled recently.  Please get chest x-ray now and a CBC.  If you have worsening signs symptoms over the weekend then recommend ED evaluation.  Follow-up in 5 to 7 days or as needed.  [name], PA-C

## 2018-02-11 ENCOUNTER — Encounter: Payer: Self-pay | Admitting: Medical

## 2018-02-11 ENCOUNTER — Ambulatory Visit: Payer: Medicare Other | Admitting: Medical

## 2018-02-11 VITALS — BP 129/80 | HR 110 | Temp 98.1°F | Resp 16 | Ht 72.0 in | Wt 240.0 lb

## 2018-02-11 DIAGNOSIS — R05 Cough: Secondary | ICD-10-CM | POA: Diagnosis not present

## 2018-02-11 DIAGNOSIS — D72829 Elevated white blood cell count, unspecified: Secondary | ICD-10-CM | POA: Diagnosis not present

## 2018-02-11 DIAGNOSIS — J40 Bronchitis, not specified as acute or chronic: Secondary | ICD-10-CM | POA: Diagnosis not present

## 2018-02-11 DIAGNOSIS — R059 Cough, unspecified: Secondary | ICD-10-CM

## 2018-02-11 DIAGNOSIS — E119 Type 2 diabetes mellitus without complications: Secondary | ICD-10-CM | POA: Diagnosis not present

## 2018-02-11 DIAGNOSIS — J449 Chronic obstructive pulmonary disease, unspecified: Secondary | ICD-10-CM

## 2018-02-11 LAB — CBC WITH DIFFERENTIAL/PLATELET
BASOS PCT: 1.3 % (ref 0.0–3.0)
Basophils Absolute: 0.1 10*3/uL (ref 0.0–0.1)
Eosinophils Absolute: 0.4 10*3/uL (ref 0.0–0.7)
Eosinophils Relative: 4.4 % (ref 0.0–5.0)
HEMATOCRIT: 44.6 % (ref 39.0–52.0)
HEMOGLOBIN: 14.5 g/dL (ref 13.0–17.0)
LYMPHS PCT: 27.2 % (ref 12.0–46.0)
Lymphs Abs: 2.4 10*3/uL (ref 0.7–4.0)
MCHC: 32.6 g/dL (ref 30.0–36.0)
MCV: 97.4 fl (ref 78.0–100.0)
MONOS PCT: 8.9 % (ref 3.0–12.0)
Monocytes Absolute: 0.8 10*3/uL (ref 0.1–1.0)
NEUTROS PCT: 58.2 % (ref 43.0–77.0)
Neutro Abs: 5.1 10*3/uL (ref 1.4–7.7)
Platelets: 239 10*3/uL (ref 150.0–400.0)
RBC: 4.57 Mil/uL (ref 4.22–5.81)
RDW: 14.9 % (ref 11.5–15.5)
WBC: 8.7 10*3/uL (ref 4.0–10.5)

## 2018-02-11 LAB — COMPREHENSIVE METABOLIC PANEL
ALT: 17 U/L (ref 0–53)
AST: 15 U/L (ref 0–37)
Albumin: 3.8 g/dL (ref 3.5–5.2)
Alkaline Phosphatase: 72 U/L (ref 39–117)
BUN: 18 mg/dL (ref 6–23)
CALCIUM: 10.5 mg/dL (ref 8.4–10.5)
CHLORIDE: 103 meq/L (ref 96–112)
CO2: 35 meq/L — AB (ref 19–32)
CREATININE: 1.09 mg/dL (ref 0.40–1.50)
GFR: 64.09 mL/min (ref >60.00)
Glucose, Bld: 132 mg/dL — ABNORMAL HIGH (ref 70–99)
Potassium: 4.8 mEq/L (ref 3.5–5.1)
Sodium: 143 mEq/L (ref 135–145)
Total Bilirubin: 0.3 mg/dL (ref 0.2–1.2)
Total Protein: 6.4 g/dL (ref 6.0–8.3)

## 2018-02-11 LAB — HEMOGLOBIN A1C: HEMOGLOBIN A1C: 6.9 % — AB (ref 4.6–6.5)

## 2018-02-11 NOTE — Progress Notes (Signed)
Subjective:    Patient ID: Lance Powell, male    DOB: 01-06-1930, 85 y.o.   MRN: [id number]  HPI  Pt in for follow up.  He states that he feels about 40-60%.   Pt still has intermittent cough. He is able to sleep at night. He is using cough drops and benzonatate.   Pt has been on levofloxin and benzonatate.   Pt wheezing is also decreasing. Not using albuterol inhaler.   CXR did not show any pneumonia but based on clinical presentation treated with levofloxin.  Infection fighting cells were elevated other day. Will get lab work today to repeat.  Hx of diabetes. Will get a1c today.   Review of Systems  Constitutional: Negative for chills, fatigue and fever.  HENT: Negative for congestion and drooling.   Respiratory: Positive for cough. Negative for chest tightness, shortness of breath, wheezing and stridor.   Cardiovascular: Negative for chest pain and palpitations.  Gastrointestinal: Negative for abdominal pain.  Musculoskeletal: Negative for back pain.  Skin: Negative for rash.  Neurological: Negative for dizziness, seizures, weakness and headaches.  Hematological: Negative for adenopathy. Does not bruise/bleed easily.  Psychiatric/Behavioral: Negative for confusion.    Past Medical History:  Diagnosis Date  . COPD (chronic obstructive pulmonary disease) ([place])   . Depression 03/08/2014  . Diabetes mellitus   . GERD (gastroesophageal reflux disease) 03/08/2014  . History of blood transfusion   . Hyperlipemia   . Hypertension   . OSA (obstructive sleep apnea)   . Osteoarthritis   . Rheumatoid arthritis(714.0)   . Sleep apnea    cpap     Social History   Socioeconomic History  . Marital status: Married    Spouse name: Not on file  . Number of children: 6  . Years of education: Not on file  . Highest education level: Not on file  Occupational History  . Occupation: Retired    Comment: Glass blower/designer  . Financial resource strain: Not on file    . Food insecurity:    Worry: Not on file    Inability: Not on file  . Transportation needs:    Medical: Not on file    Non-medical: Not on file  Tobacco Use  . Smoking status: Former Smoker    Packs/day: 4.00    Years: 30.00    Pack years: 120.00    Types: Cigarettes    Last attempt to quit: 01/21/1978    Years since quitting: 40.0  . Smokeless tobacco: Never Used  Substance and Sexual Activity  . Alcohol use: No  . Drug use: No  . Sexual activity: Not on file  Lifestyle  . Physical activity:    Days per week: Not on file    Minutes per session: Not on file  . Stress: Not on file  Relationships  . Social connections:    Talks on phone: Not on file    Gets together: Not on file    Attends religious service: Not on file    Active member of club or organization: Not on file    Attends meetings of clubs or organizations: Not on file    Relationship status: Not on file  . Intimate partner violence:    Fear of current or ex partner: Not on file    Emotionally abused: Not on file    Physically abused: Not on file    Forced sexual activity: Not on file  Other Topics Concern  . Not on file  Social History Narrative  . Not on file    Past Surgical History:  Procedure Laterality Date  . APPENDECTOMY  1969  . CHOLECYSTECTOMY N/A 06/16/2014   Procedure: LAPAROSCOPIC CHOLECYSTECTOMY;  Surgeon: Greer Pickerel, MD;  Location: [facility];  Service: General;  Laterality: N/A;  . CYSTOSCOPY  01/23/2011   Procedure: CYSTOSCOPY;  Surgeon: Molli Hazard, MD;  Location: [facility];  Service: Urology;  Laterality: N/A;  . FLEXIBLE SIGMOIDOSCOPY  01/07/2012   Procedure: FLEXIBLE SIGMOIDOSCOPY;  Surgeon: Ladene Artist, MD,FACG;  Location: [facility];  Service: Endoscopy;  Laterality: N/A;  . HERNIA REPAIR  1990   right and left inguinal  . NISSEN FUNDOPLICATION    . 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 TONSILLECTOMY  1937  . TRANSURETHRAL RESECTION OF  PROSTATE  01/23/2011   Procedure: TRANSURETHRAL RESECTION OF THE PROSTATE WITH GYRUS INSTRUMENTS;  Surgeon: Molli Hazard, MD;  Location: [facility];  Service: Urology;  Laterality: N/A;  . VASECTOMY  1972    Family History  Problem Relation Age of Onset  . Heart disease Mother   . Emphysema Brother   . Cancer Daughter        breast    No Known Allergies  Current Outpatient Medications on File Prior to Visit  Medication Sig Dispense Refill  . albuterol (PROVENTIL HFA;VENTOLIN HFA) 108 (90 Base) MCG/ACT inhaler Inhale 2 puffs into the lungs every 6 (six) hours as needed for wheezing or shortness of breath. 1 Inhaler 2  . aspirin 81 MG tablet Take 1 tablet (81 mg total) by mouth every morning. Stop for 1 week and then resume (Patient taking differently: Take 81 mg by mouth every morning. )    . atorvastatin (LIPITOR) 40 MG tablet TAKE 1 TABLET BY MOUTH ONCE DAILY IN THE MORNING 90 tablet 0  . benzonatate (TESSALON) 100 MG capsule Take 1 capsule (100 mg total) by mouth 3 (three) times daily as needed for cough. 30 capsule 0  . celecoxib (CELEBREX) 200 MG capsule TAKE 1 CAPSULE BY MOUTH ONCE DAILY IN THE EVENING 30 capsule 1  . cholecalciferol (VITAMIN D) 1000 UNITS tablet Take 2,000 Units by mouth 2 (two) times daily.     . clonazePAM (KLONOPIN) 0.25 MG disintegrating tablet 1 tab po q hs prn insomnia or anxiety 10 tablet 2  . Difluprednate 0.05 % EMUL One drop in operative eye BID starting 2 days prior to surgery and after surgery for 3 weeks    . DUREZOL 0.05 % EMUL INSTILL 1 DROP TWICE DAILY INTO OPERATIVE EYE STARTING 2 DAYS PRIOR TO SURGERY AND THEN AFTER SURGERY FOR 3 WEEKS    . enalapril (VASOTEC) 2.5 MG tablet Take 2.5 mg by mouth every morning. Reported on 07/12/2015    . fluticasone (FLOVENT HFA) 110 MCG/ACT inhaler Inhale 2 puffs into the lungs 2 (two) times daily. 1 Inhaler 0  . glucose blood (BAYER CONTOUR TEST) test strip Use as instructed to check blood  sugar twice a day.  DX  E11.9 100 each 5  . Indacaterol Maleate (ARCAPTA NEOHALER) 75 MCG CAPS Place 1 capsule into inhaler and inhale every morning. 90 capsule 1  . ketoconazole (NIZORAL) 2 % cream As  Directed.  0  . ketoconazole (NIZORAL) 2 % shampoo As directed.  0  . levofloxacin (LEVAQUIN) 500 MG tablet Take 1 tablet (500 mg total) by mouth daily. 10 tablet 0  . lidocaine (LIDODERM) 5 % Place 1 patch onto  the skin daily. Remove & Discard patch within 12 hours or as directed by MD 30 patch 0  . meclizine (ANTIVERT) 12.5 MG tablet Take 1 tablet (12.5 mg total) by mouth 3 (three) times daily as needed for dizziness. 30 tablet 0  . metFORMIN (GLUCOPHAGE) 500 MG tablet Take 1 tablet (500 mg total) by mouth 3 (three) times daily. 270 tablet 0  . metFORMIN (GLUCOPHAGE) 500 MG tablet TAKE 1 TABLET BY MOUTH THREE TIMES DAILY 90 tablet 1  . misoprostol (CYTOTEC) 100 MCG tablet TAKE 1 TABLET BY MOUTH IN THE MORNING AND 1 IN THE EVENING AND 2 AT BEDTIME 120 tablet 0  . Multiple Vitamins-Minerals (CENTRUM) tablet Take 1 tablet by mouth every morning. Reported on 01/05/2015    . ofloxacin (OCUFLOX) 0.3 % ophthalmic solution INSTILL 1 DROP TWICE DAILY INTO OPERATIVE EYE STARTING 2 DAYS PRIOR TO SURGERY AND AFTER SURGERY FOR 3 WEEKS    . ofloxacin (OCUFLOX) 0.3 % ophthalmic solution Instill 1 drop BID in operative eye starting 2 days prior to surgery and after surgery for 3 weeks.    Marland Kitchen OVER THE COUNTER MEDICATION Place 1-2 drops into both eyes daily as needed (dry red eyes.). Reported on 01/05/2015    . pioglitazone (ACTOS) 15 MG tablet TAKE 1 TABLET BY MOUTH ONCE DAILY 90 tablet 0  . Tiotropium Bromide-Olodaterol (STIOLTO RESPIMAT) 2.5-2.5 MCG/ACT AERS Inhale 2 puffs into the lungs daily. 4 g 5  . tobramycin (TOBREX) 0.3 % ophthalmic solution Place 2 drops into the left eye every 6 (six) hours. 5 mL 0  . UNABLE TO FIND CPAP    . venlafaxine XR (EFFEXOR-XR) 37.5 MG 24 hr capsule TAKE 1 CAPSULE BY MOUTH ONCE  DAILY WITH BREAKFAST 90 capsule 0  . vitamin C (ASCORBIC ACID) 500 MG tablet Take 500 mg by mouth every morning.      No current facility-administered medications on file prior to visit.     BP 129/80   Pulse (!) 110   Temp 98.1 F (36.7 C) (Oral)   Resp 16   Ht 6' (1.829 m)   Wt 240 lb (108.9 kg)   SpO2 98%   BMI 32.55 kg/m       Objective:   Physical Exam  General- No acute distress. Pleasant patient. Neck- Full range of motion, no jvd Lungs- Clear, even and unlabored. Little deeper today on exam Heart- regular rate and rhythm. Neurologic- CNII- XII grossly intact.  Lower ext- no pedal edema .negative homans sign.       Assessment & Plan:  Your overall look better today.   Recent bronchitis with negative xxr but clinical concern for pneumonia based on wbc of 16. Continue antibiotic until 10 days finished  For copd continue oxygen and inhalers. Wheezing also less.  For cough use benzonatate.  For diabetes will get cmp and a1c today.  Follow up in 7 days or as needed  [employer]

## 2018-02-11 NOTE — Patient Instructions (Addendum)
Your overall look better today.   Recent bronchitis with negative xxr but clinical concern for pneumonia based on wbc of 16. Continue antibiotic until 10 days finished  For copd continue oxygen and inhalers. Wheezing also less.  For cough use benzonatate.  For diabetes will get cmp and a1c today.  Follow up in 7 days or as needed

## 2018-02-18 ENCOUNTER — Ambulatory Visit: Payer: Medicare Other | Admitting: Medical

## 2018-02-18 ENCOUNTER — Encounter: Payer: Self-pay | Admitting: Medical

## 2018-02-18 VITALS — BP 122/76 | HR 114 | Temp 97.5°F | Resp 16 | Ht 72.0 in | Wt 237.4 lb

## 2018-02-18 DIAGNOSIS — J40 Bronchitis, not specified as acute or chronic: Secondary | ICD-10-CM | POA: Diagnosis not present

## 2018-02-18 DIAGNOSIS — E119 Type 2 diabetes mellitus without complications: Secondary | ICD-10-CM

## 2018-02-18 DIAGNOSIS — D72829 Elevated white blood cell count, unspecified: Secondary | ICD-10-CM | POA: Diagnosis not present

## 2018-02-18 DIAGNOSIS — E785 Hyperlipidemia, unspecified: Secondary | ICD-10-CM | POA: Diagnosis not present

## 2018-02-18 NOTE — Patient Instructions (Signed)
You do appear to be completely better from your bronchitis.  In addition your infection fighting cells are now normal after changing antibiotic to levofloxacin.(During course of your illness I had concern that you may have had pneumonia.)  For your COPD continue your oxygen and inhalers.  For diabetes continue current medication regimen as here A1c was acceptable at 6.9.  You are fasting today and will get a lipid panel.  We will update you on those results when they are in.  Recommend follow-up in 3 months or as needed.

## 2018-02-18 NOTE — Progress Notes (Signed)
Subjective:    Patient ID: Lance Powell, male    DOB: 07/19/1929, 85 y.o.   MRN: [id number]  HPI  Pt in for follow up.  He states he is feeling better back to baseline/back to normal. No fever, no chills or sweats. No cough. No sob reported.  Pt on oxygen for copd. Based on clinical presentation and wbc count I did add levofloxin. Repeat wbc was 8.7.   He is fasting. Last a1c was 6.9.   Will get lipid panel today.    Review of Systems  Constitutional: Negative for chills, fatigue and fever.  HENT: Negative for congestion, ear discharge, ear pain, facial swelling and hearing loss.   Respiratory: Negative for cough, chest tightness, shortness of breath and wheezing.   Cardiovascular: Negative for chest pain and palpitations.  Gastrointestinal: Negative for abdominal pain.  Genitourinary: Negative for dysuria, flank pain and frequency.  Musculoskeletal: Negative for back pain.  Skin: Negative for rash.  Neurological: Negative for dizziness, light-headedness and headaches.  Hematological: Negative for adenopathy. Does not bruise/bleed easily.  Psychiatric/Behavioral: Negative for behavioral problems and confusion.    Past Medical History:  Diagnosis Date  . COPD (chronic obstructive pulmonary disease) ([place])   . Depression 03/08/2014  . Diabetes mellitus   . GERD (gastroesophageal reflux disease) 03/08/2014  . History of blood transfusion   . Hyperlipemia   . Hypertension   . OSA (obstructive sleep apnea)   . Osteoarthritis   . Rheumatoid arthritis(714.0)   . Sleep apnea    cpap     Social History   Socioeconomic History  . Marital status: Married    Spouse name: Not on file  . Number of children: 6  . Years of education: Not on file  . Highest education level: Not on file  Occupational History  . Occupation: Retired    Comment: Glass blower/designer  . Financial resource strain: Not on file  . Food insecurity:    Worry: Not on file    Inability: Not  on file  . Transportation needs:    Medical: Not on file    Non-medical: Not on file  Tobacco Use  . Smoking status: Former Smoker    Packs/day: 4.00    Years: 30.00    Pack years: 120.00    Types: Cigarettes    Last attempt to quit: 01/21/1978    Years since quitting: 40.1  . Smokeless tobacco: Never Used  Substance and Sexual Activity  . Alcohol use: No  . Drug use: No  . Sexual activity: Not on file  Lifestyle  . Physical activity:    Days per week: Not on file    Minutes per session: Not on file  . Stress: Not on file  Relationships  . Social connections:    Talks on phone: Not on file    Gets together: Not on file    Attends religious service: Not on file    Active member of club or organization: Not on file    Attends meetings of clubs or organizations: Not on file    Relationship status: Not on file  . Intimate partner violence:    Fear of current or ex partner: Not on file    Emotionally abused: Not on file    Physically abused: Not on file    Forced sexual activity: Not on file  Other Topics Concern  . Not on file  Social History Narrative  . Not on file    Past  Surgical History:  Procedure Laterality Date  . APPENDECTOMY  1969  . CHOLECYSTECTOMY N/A 06/16/2014   Procedure: LAPAROSCOPIC CHOLECYSTECTOMY;  Surgeon: Greer Pickerel, MD;  Location: [facility];  Service: General;  Laterality: N/A;  . CYSTOSCOPY  01/23/2011   Procedure: CYSTOSCOPY;  Surgeon: Molli Hazard, MD;  Location: [facility];  Service: Urology;  Laterality: N/A;  . FLEXIBLE SIGMOIDOSCOPY  01/07/2012   Procedure: FLEXIBLE SIGMOIDOSCOPY;  Surgeon: Ladene Artist, MD,FACG;  Location: [facility];  Service: Endoscopy;  Laterality: N/A;  . HERNIA REPAIR  1990   right and left inguinal  . NISSEN FUNDOPLICATION    . 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 TONSILLECTOMY  1937  . TRANSURETHRAL RESECTION OF PROSTATE  01/23/2011   Procedure: TRANSURETHRAL RESECTION OF THE  PROSTATE WITH GYRUS INSTRUMENTS;  Surgeon: Molli Hazard, MD;  Location: [facility];  Service: Urology;  Laterality: N/A;  . VASECTOMY  1972    Family History  Problem Relation Age of Onset  . Heart disease Mother   . Emphysema Brother   . Cancer Daughter        breast    No Known Allergies  Current Outpatient Medications on File Prior to Visit  Medication Sig Dispense Refill  . albuterol (PROVENTIL HFA;VENTOLIN HFA) 108 (90 Base) MCG/ACT inhaler Inhale 2 puffs into the lungs every 6 (six) hours as needed for wheezing or shortness of breath. 1 Inhaler 2  . aspirin 81 MG tablet Take 1 tablet (81 mg total) by mouth every morning. Stop for 1 week and then resume (Patient taking differently: Take 81 mg by mouth every morning. )    . atorvastatin (LIPITOR) 40 MG tablet TAKE 1 TABLET BY MOUTH ONCE DAILY IN THE MORNING 90 tablet 0  . benzonatate (TESSALON) 100 MG capsule Take 1 capsule (100 mg total) by mouth 3 (three) times daily as needed for cough. 30 capsule 0  . celecoxib (CELEBREX) 200 MG capsule TAKE 1 CAPSULE BY MOUTH ONCE DAILY IN THE EVENING 30 capsule 1  . cholecalciferol (VITAMIN D) 1000 UNITS tablet Take 2,000 Units by mouth 2 (two) times daily.     . clonazePAM (KLONOPIN) 0.25 MG disintegrating tablet 1 tab po q hs prn insomnia or anxiety 10 tablet 2  . enalapril (VASOTEC) 2.5 MG tablet Take 2.5 mg by mouth every morning. Reported on 07/12/2015    . fluticasone (FLOVENT HFA) 110 MCG/ACT inhaler Inhale 2 puffs into the lungs 2 (two) times daily. 1 Inhaler 0  . glucose blood (BAYER CONTOUR TEST) test strip Use as instructed to check blood sugar twice a day.  DX  E11.9 100 each 5  . Indacaterol Maleate (ARCAPTA NEOHALER) 75 MCG CAPS Place 1 capsule into inhaler and inhale every morning. 90 capsule 1  . ketoconazole (NIZORAL) 2 % cream As  Directed.  0  . ketoconazole (NIZORAL) 2 % shampoo As directed.  0  . levofloxacin (LEVAQUIN) 500 MG tablet Take 1 tablet  (500 mg total) by mouth daily. 10 tablet 0  . lidocaine (LIDODERM) 5 % Place 1 patch onto the skin daily. Remove & Discard patch within 12 hours or as directed by MD 30 patch 0  . meclizine (ANTIVERT) 12.5 MG tablet Take 1 tablet (12.5 mg total) by mouth 3 (three) times daily as needed for dizziness. 30 tablet 0  . metFORMIN (GLUCOPHAGE) 500 MG tablet Take 1 tablet (500 mg total) by mouth 3 (three) times daily. 270 tablet 0  .  metFORMIN (GLUCOPHAGE) 500 MG tablet TAKE 1 TABLET BY MOUTH THREE TIMES DAILY 90 tablet 1  . misoprostol (CYTOTEC) 100 MCG tablet TAKE 1 TABLET BY MOUTH IN THE MORNING AND 1 IN THE EVENING AND 2 AT BEDTIME 120 tablet 0  . Multiple Vitamins-Minerals (CENTRUM) tablet Take 1 tablet by mouth every morning. Reported on 01/05/2015    . ofloxacin (OCUFLOX) 0.3 % ophthalmic solution INSTILL 1 DROP TWICE DAILY INTO OPERATIVE EYE STARTING 2 DAYS PRIOR TO SURGERY AND AFTER SURGERY FOR 3 WEEKS    . ofloxacin (OCUFLOX) 0.3 % ophthalmic solution Instill 1 drop BID in operative eye starting 2 days prior to surgery and after surgery for 3 weeks.    Marland Kitchen OVER THE COUNTER MEDICATION Place 1-2 drops into both eyes daily as needed (dry red eyes.). Reported on 01/05/2015    . pioglitazone (ACTOS) 15 MG tablet TAKE 1 TABLET BY MOUTH ONCE DAILY 90 tablet 0  . Tiotropium Bromide-Olodaterol (STIOLTO RESPIMAT) 2.5-2.5 MCG/ACT AERS Inhale 2 puffs into the lungs daily. 4 g 5  . tobramycin (TOBREX) 0.3 % ophthalmic solution Place 2 drops into the left eye every 6 (six) hours. 5 mL 0  . UNABLE TO FIND CPAP    . venlafaxine XR (EFFEXOR-XR) 37.5 MG 24 hr capsule TAKE 1 CAPSULE BY MOUTH ONCE DAILY WITH BREAKFAST 90 capsule 0  . vitamin C (ASCORBIC ACID) 500 MG tablet Take 500 mg by mouth every morning.      No current facility-administered medications on file prior to visit.     BP 122/76   Pulse (!) 114   Temp (!) 97.5 F (36.4 C) (Oral)   Resp 16   Ht 6' (1.829 m)   Wt 237 lb 6.4 oz (107.7 kg)   SpO2  94%   BMI 32.20 kg/m       Objective:   Physical Exam  General  Mental Status - Alert. General Appearance - Well groomed. Not in acute distress.  Skin Rashes- No Rashes.  HEENT Head- Normal. Ear Auditory Canal - Left- Normal. Right - Normal.Tympanic Membrane- Left- Normal. Right- Normal. Eye Sclera/Conjunctiva- Left- Normal. Right- Normal. Nose & Sinuses Nasal Mucosa- Left-  Boggy and Congested. Right-  Boggy and  Congested.Bilateral  No maxillary and  No frontal sinus pressure. Mouth & Throat Lips: Upper Lip- Normal: no dryness, cracking, pallor, cyanosis, or vesicular eruption. Lower Lip-Normal: no dryness, cracking, pallor, cyanosis or vesicular eruption. Buccal Mucosa- Bilateral- No Aphthous ulcers. Oropharynx- No Discharge or Erythema. Tonsils: Characteristics- Bilateral- No Erythema or Congestion. Size/Enlargement- Bilateral- No enlargement. Discharge- bilateral-None.  Neck Neck- Supple. No Masses.   Chest and Lung Exam Auscultation: Breath Sounds:-Clear even and unlabored.  Cardiovascular Auscultation:Rythm- Regular, rate and rhythm. Murmurs & Other Heart Sounds:Ausculatation of the heart reveal- No Murmurs.  Lymphatic Head & Neck General Head & Neck Lymphatics: Bilateral: Description- No Localized lymphadenopathy.       Assessment & Plan:  You do appear to be completely better from your bronchitis.  In addition your infection fighting cells are now normal after changing antibiotic to levofloxacin.(During course of your illness I had concern that you may have had pneumonia.)  For your COPD continue your oxygen and inhalers.  For diabetes continue current medication regimen as here A1c was acceptable at 6.9.  You are fasting today and will get a lipid panel.  We will update you on those results when they are in.  Recommend follow-up in 3 months or as needed.  [name], PA-C

## 2018-02-19 LAB — LIPID PANEL
CHOLESTEROL: 142 mg/dL (ref 0–200)
HDL: 38.4 mg/dL — ABNORMAL LOW (ref >39.00)
LDL Cholesterol: 87 mg/dL (ref 0–99)
NonHDL: 103.78
TRIGLYCERIDES: 82 mg/dL (ref 0.0–149.0)
Total CHOL/HDL Ratio: 4
VLDL: 16.4 mg/dL (ref 0.0–40.0)

## 2018-02-22 ENCOUNTER — Other Ambulatory Visit: Payer: Self-pay | Admitting: Medical

## 2018-02-26 ENCOUNTER — Ambulatory Visit (INDEPENDENT_AMBULATORY_CARE_PROVIDER_SITE_OTHER): Payer: Medicare Other | Admitting: Podiatry

## 2018-02-26 ENCOUNTER — Telehealth: Payer: Self-pay | Admitting: Podiatry

## 2018-02-26 DIAGNOSIS — M79674 Pain in right toe(s): Secondary | ICD-10-CM

## 2018-02-26 DIAGNOSIS — B351 Tinea unguium: Secondary | ICD-10-CM | POA: Diagnosis not present

## 2018-02-26 DIAGNOSIS — M79675 Pain in left toe(s): Secondary | ICD-10-CM | POA: Diagnosis not present

## 2018-02-26 DIAGNOSIS — E1151 Type 2 diabetes mellitus with diabetic peripheral angiopathy without gangrene: Secondary | ICD-10-CM | POA: Diagnosis not present

## 2018-02-26 DIAGNOSIS — Q828 Other specified congenital malformations of skin: Secondary | ICD-10-CM | POA: Diagnosis not present

## 2018-02-26 NOTE — Progress Notes (Signed)
Subjective: Patient presents today with diabetes and painful porokeratotic lesion submet head 2nd left foot as well as painful, discolored, thick toenails which interfere with daily activities. Pain is aggravated when wearing enclosed shoe gear. Pain is getting progressively worse and relieved with periodic professional debridement.  His daughter is present during the visit.  He voices no new pedal concerns on today's visit.  [name], Iris Pert is his PCP. Last visit was February 18, 2018.   Current Outpatient Medications:  .  albuterol (PROVENTIL HFA;VENTOLIN HFA) 108 (90 Base) MCG/ACT inhaler, Inhale 2 puffs into the lungs every 6 (six) hours as needed for wheezing or shortness of breath., Disp: 1 Inhaler, Rfl: 2 .  aspirin 81 MG tablet, Take 1 tablet (81 mg total) by mouth every morning. Stop for 1 week and then resume (Patient taking differently: Take 81 mg by mouth every morning. ), Disp: , Rfl:  .  atorvastatin (LIPITOR) 40 MG tablet, TAKE 1 TABLET BY MOUTH ONCE DAILY IN THE MORNING, Disp: 90 tablet, Rfl: 0 .  benzonatate (TESSALON) 100 MG capsule, Take 1 capsule (100 mg total) by mouth 3 (three) times daily as needed for cough., Disp: 30 capsule, Rfl: 0 .  celecoxib (CELEBREX) 200 MG capsule, TAKE 1 CAPSULE BY MOUTH ONCE DAILY IN THE EVENING, Disp: 30 capsule, Rfl: 1 .  cholecalciferol (VITAMIN D) 1000 UNITS tablet, Take 2,000 Units by mouth 2 (two) times daily. , Disp: , Rfl:  .  clonazePAM (KLONOPIN) 0.25 MG disintegrating tablet, 1 tab po q hs prn insomnia or anxiety, Disp: 10 tablet, Rfl: 2 .  enalapril (VASOTEC) 2.5 MG tablet, Take 2.5 mg by mouth every morning. Reported on 07/12/2015, Disp: , Rfl:  .  fluticasone (FLOVENT HFA) 110 MCG/ACT inhaler, Inhale 2 puffs into the lungs 2 (two) times daily., Disp: 1 Inhaler, Rfl: 0 .  glucose blood (BAYER CONTOUR TEST) test strip, Use as instructed to check blood sugar twice a day.  DX  E11.9, Disp: 100 each, Rfl: 5 .  Indacaterol Maleate  (ARCAPTA NEOHALER) 75 MCG CAPS, Place 1 capsule into inhaler and inhale every morning., Disp: 90 capsule, Rfl: 1 .  ketoconazole (NIZORAL) 2 % cream, As  Directed., Disp: , Rfl: 0 .  ketoconazole (NIZORAL) 2 % shampoo, As directed., Disp: , Rfl: 0 .  levofloxacin (LEVAQUIN) 500 MG tablet, Take 1 tablet (500 mg total) by mouth daily., Disp: 10 tablet, Rfl: 0 .  lidocaine (LIDODERM) 5 %, Place 1 patch onto the skin daily. Remove & Discard patch within 12 hours or as directed by MD, Disp: 30 patch, Rfl: 0 .  meclizine (ANTIVERT) 12.5 MG tablet, Take 1 tablet (12.5 mg total) by mouth 3 (three) times daily as needed for dizziness., Disp: 30 tablet, Rfl: 0 .  metFORMIN (GLUCOPHAGE) 500 MG tablet, Take 1 tablet (500 mg total) by mouth 3 (three) times daily., Disp: 270 tablet, Rfl: 0 .  metFORMIN (GLUCOPHAGE) 500 MG tablet, TAKE 1 TABLET BY MOUTH THREE TIMES DAILY, Disp: 90 tablet, Rfl: 0 .  misoprostol (CYTOTEC) 100 MCG tablet, TAKE 1 TABLET BY MOUTH IN THE MORNING AND 1 IN THE EVENING AND 2 AT BEDTIME, Disp: 120 tablet, Rfl: 0 .  Multiple Vitamins-Minerals (CENTRUM) tablet, Take 1 tablet by mouth every morning. Reported on 01/05/2015, Disp: , Rfl:  .  ofloxacin (OCUFLOX) 0.3 % ophthalmic solution, INSTILL 1 DROP TWICE DAILY INTO OPERATIVE EYE STARTING 2 DAYS PRIOR TO SURGERY AND AFTER SURGERY FOR 3 WEEKS, Disp: , Rfl:  .  ofloxacin (OCUFLOX) 0.3 % ophthalmic solution, Instill 1 drop BID in operative eye starting 2 days prior to surgery and after surgery for 3 weeks., Disp: , Rfl:  .  OVER THE COUNTER MEDICATION, Place 1-2 drops into both eyes daily as needed (dry red eyes.). Reported on 01/05/2015, Disp: , Rfl:  .  pioglitazone (ACTOS) 15 MG tablet, TAKE 1 TABLET BY MOUTH ONCE DAILY, Disp: 90 tablet, Rfl: 0 .  Tiotropium Bromide-Olodaterol (STIOLTO RESPIMAT) 2.5-2.5 MCG/ACT AERS, Inhale 2 puffs into the lungs daily., Disp: 4 g, Rfl: 5 .  tobramycin (TOBREX) 0.3 % ophthalmic solution, Place 2 drops into the  left eye every 6 (six) hours., Disp: 5 mL, Rfl: 0 .  UNABLE TO FIND, CPAP, Disp: , Rfl:  .  venlafaxine XR (EFFEXOR-XR) 37.5 MG 24 hr capsule, TAKE 1 CAPSULE BY MOUTH ONCE DAILY WITH BREAKFAST, Disp: 90 capsule, Rfl: 0 .  vitamin C (ASCORBIC ACID) 500 MG tablet, Take 500 mg by mouth every morning. , Disp: , Rfl:    No Known Allergies   Objective:  Vascular Examination: Capillary refill time normal x 10 digits  Dorsalis pedis pulses 1/4  B/l  Posterior tibial pulses absent b/l  No digital hair x 10 digits  Skin temperature WNL b/l  Nonpitting edema BLE   Dermatological Examination: Skin with normal texture and tone b/l  Toenails 1-5 b/l discolored, thick, dystrophic with subungual debris and pain with palpation to nailbeds due to thickness of nails.  Porokeratotic lesion submet 2 left foot. No erythema, no edema, no drainage. No flocculene noted.  Musculoskeletal: Muscle strength 5/5 to all LE muscle groups  Hammertoe 2nd b/l  Neurological: Sensation intact with 10 gram monofilament.  Vibratory sensation intact.  Assessment: 1. Painful onychomycosis toenails 1-5 b/l 2. Porokeratotic lesion submet head 2 left foot 3. NIDDM with Peripheral arterial disease  Plan: 1. Toenails 1-5 b/l were debrided in length and girth without iatrogenic bleeding. 2. Porokeratotic lesion pared and enucleated with sterile scalpel without incident. 3. Patient to continue soft, supportive shoe gear 4. Patient to report any pedal injuries to medical professional  5. Follow up 3 months.  6. Patient/POA to call should there be a concern in the interim.

## 2018-02-26 NOTE — Telephone Encounter (Signed)
Pt checking on status of diabetic shoes. Please call pt with an update.

## 2018-02-26 NOTE — Patient Instructions (Addendum)

## 2018-03-02 NOTE — Telephone Encounter (Signed)
Called pt and he is currently seen by [name] Pa and per Medicare guidelines pt has to see a MD/DO for them to sign off on diabetic shoes and inserts. Pt is going to call the pcp office and set up an appt to see an MD/DO and let me know so we can send paperwork to them.

## 2018-03-03 ENCOUNTER — Telehealth: Payer: Self-pay

## 2018-03-03 NOTE — Telephone Encounter (Signed)
Probably is the case. Medicare has a lot of rules. Usually pt has diabetic shoe form to be filled out. The doctors in our office usually get podiatrist to fill out the medicare form. Doe he have diabetic shoe form?

## 2018-03-03 NOTE — Telephone Encounter (Signed)
Copied from [place] [id number]. Topic: General - Other >> Mar 02, 2018  3:58 PM Alanda Slim E wrote: Reason for CRM: [facility] informed Pt that a Doctor not a PA must approve and write an order for his diabetic shoes. Derrek Monaco advise

## 2018-03-04 NOTE — Telephone Encounter (Signed)
Left me a message for him to have [facility] to fax over forms.

## 2018-03-09 ENCOUNTER — Other Ambulatory Visit: Payer: Self-pay | Admitting: Medical

## 2018-03-22 ENCOUNTER — Other Ambulatory Visit: Payer: Self-pay | Admitting: Medical

## 2018-03-28 ENCOUNTER — Other Ambulatory Visit: Payer: Self-pay | Admitting: Medical

## 2018-04-08 ENCOUNTER — Other Ambulatory Visit: Payer: Self-pay | Admitting: Medical

## 2018-04-24 ENCOUNTER — Other Ambulatory Visit: Payer: Self-pay | Admitting: Medical

## 2018-04-25 MED ORDER — GLUCOSE BLOOD VI STRP: ORAL_STRIP | 5 refills | Status: DC

## 2018-04-25 MED ORDER — FLUTICASONE PROPIONATE HFA 110 MCG/ACT IN AERO: 2.0000 | INHALATION_SPRAY | Freq: Two times a day (BID) | RESPIRATORY_TRACT | 0 refills | Status: DC

## 2018-05-06 ENCOUNTER — Other Ambulatory Visit: Payer: Self-pay | Admitting: Medical

## 2018-05-12 ENCOUNTER — Other Ambulatory Visit: Payer: Self-pay | Admitting: Medical

## 2018-05-12 NOTE — Telephone Encounter (Signed)
RX refill sent to pt pharmacy.

## 2018-05-15 ENCOUNTER — Other Ambulatory Visit: Payer: Self-pay

## 2018-05-20 ENCOUNTER — Encounter: Payer: Self-pay | Admitting: Medical

## 2018-05-20 ENCOUNTER — Other Ambulatory Visit: Payer: Self-pay

## 2018-05-20 ENCOUNTER — Ambulatory Visit (INDEPENDENT_AMBULATORY_CARE_PROVIDER_SITE_OTHER): Payer: Medicare Other | Admitting: Medical

## 2018-05-20 DIAGNOSIS — J449 Chronic obstructive pulmonary disease, unspecified: Secondary | ICD-10-CM | POA: Diagnosis not present

## 2018-05-20 DIAGNOSIS — E785 Hyperlipidemia, unspecified: Secondary | ICD-10-CM | POA: Diagnosis not present

## 2018-05-20 DIAGNOSIS — B351 Tinea unguium: Secondary | ICD-10-CM

## 2018-05-20 DIAGNOSIS — E119 Type 2 diabetes mellitus without complications: Secondary | ICD-10-CM

## 2018-05-20 NOTE — Patient Instructions (Addendum)
Diabetes appears to be under good control. Continue metformin.  For high cholesterol continue with current statin.  Copd controlled. Continue inhalers and o2.   Glad to hear nails being filed by podiatrist and you are waiting for diabetic shoes.  Discussed risk factor for covid complications so stay sheltered at home. Get mask if possible to use if you have to leave house.  Will check if we have mask to spare.  Please check bp and pulse. Call me back with readings.  Follow up august for office visit and labs. Also make sure you get flu vaccine august this year.

## 2018-05-20 NOTE — Progress Notes (Signed)
Subjective:    Patient ID: Lance Powell, male    DOB: 1929/11/04, 85 y.o.   MRN: [id number]  HPI Virtual Visit via Telephone Note  I connected with Kathlen Mody on 05/20/18 at  1:00 PM EDT by telephone and verified that I am speaking with the correct person using two identifiers.   I discussed the limitations, risks, security and privacy concerns of performing an evaluation and management service by telephone and the availability of in person appointments. I also discussed with the patient that there may be a patient responsible charge related to this service. The patient expressed understanding and agreed to proceed.  Pt states no access to device to do virtual visit.  Pt did not check vitals. He can't find bp cuff. He will look for it and give me up on reading on  bp and pulse.   History of Present Illness: Pt has diabetes. His last a1c was 6.9. he is currently on metformin. His sugar are ranging from fasting levels at low 100-123. No hyper or hypoglycemic signs. He states only on metformin.  In October saw pulmonologist. He is co[mpliant on cpap for sleep apnea. For copd he use 2/L oxygen. Also using arcapta neohaler, flovent and has albuterol(to use if needed.)  Hx of hyperlipidemia. Last lipd panel looked good 3 months ago. He is on atorvastatin. No cardiac or neurologic signs or symptoms.  Pt has onyhomycosis and has seen podiatrist for this. Had nails debrided. Pt waiting on diabetic shoes.     Observations/Objective:  No exam done. But appears by phone to be in good mood. Speaking in normal pattern.   Assessment and Plan: Diabetes appears to be under good control. Continue metformin.  For high cholesterol continue with current statin.  Copd controlled. Continue inhalers and o2.   Glad to hear nails being filed by podiatrist and you are waiting for diabetic shoes.  Discussed risk factor for covid complications so stay sheltered at home. Get mask if possible  to use if you have to leave house.  Will check if we have mask to spare.  Please check bp and pulse. Call me back with readings.  Follow up august for office visit and labs. Also make sure you get flu vaccine august this year.  Follow Up Instructions:    I discussed the assessment and treatment plan with the patient. The patient was provided an opportunity to ask questions and all were answered. The patient agreed with the plan and demonstrated an understanding of the instructions.   The patient was advised to call back or seek an in-person evaluation if the symptoms worsen or if the condition fails to improve as anticipated.  I provided 15  minutes of non-face-to-face time during this encounter.   Level of service charge based on most recent updated grid.   [name], PA-C    Review of Systems  Constitutional: Negative for chills, fatigue and fever.  HENT: Negative for congestion, ear discharge and ear pain.   Respiratory: Negative for cough, choking, shortness of breath and wheezing.   Cardiovascular: Negative for chest pain and palpitations.  Gastrointestinal: Negative for abdominal pain.  Endocrine: Negative for polydipsia, polyphagia and polyuria.  Musculoskeletal: Negative for back pain, joint swelling and neck pain.  Skin: Negative for rash.  Neurological: Negative for dizziness, speech difficulty, weakness, numbness and headaches.  Hematological: Negative for adenopathy. Does not bruise/bleed easily.  Psychiatric/Behavioral: Negative for behavioral problems, confusion and sleep disturbance. The patient is not nervous/anxious.  Past Medical History:  Diagnosis Date  . 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 COPD (chronic obstructive pulmonary disease) ([place])   . Depression 03/08/2014  . Diabetes mellitus   . GERD (gastroesophageal reflux disease) 03/08/2014  . History of blood transfusion   . Hyperlipemia   . Hypertension   . OSA (obstructive sleep apnea)   .  Osteoarthritis   . Rheumatoid arthritis(714.0)   . Sleep apnea    cpap     Social History   Socioeconomic History  . Marital status: Married    Spouse name: Not on file  . Number of children: 6  . Years of education: Not on file  . Highest education level: Not on file  Occupational History  . Occupation: Retired    Comment: Glass blower/designer  . Financial resource strain: Not on file  . Food insecurity:    Worry: Not on file    Inability: Not on file  . Transportation needs:    Medical: Not on file    Non-medical: Not on file  Tobacco Use  . Smoking status: Former Smoker    Packs/day: 4.00    Years: 30.00    Pack years: 120.00    Types: Cigarettes    Last attempt to quit: 01/21/1978    Years since quitting: 40.3  . Smokeless tobacco: Never Used  Substance and Sexual Activity  . Alcohol use: No  . Drug use: No  . Sexual activity: Not on file  Lifestyle  . Physical activity:    Days per week: Not on file    Minutes per session: Not on file  . Stress: Not on file  Relationships  . Social connections:    Talks on phone: Not on file    Gets together: Not on file    Attends religious service: Not on file    Active member of club or organization: Not on file    Attends meetings of clubs or organizations: Not on file    Relationship status: Not on file  . Intimate partner violence:    Fear of current or ex partner: Not on file    Emotionally abused: Not on file    Physically abused: Not on file    Forced sexual activity: Not on file  Other Topics Concern  . Not on file  Social History Narrative  . Not on file    Past Surgical History:  Procedure Laterality Date  . APPENDECTOMY  1969  . CHOLECYSTECTOMY N/A 06/16/2014   Procedure: LAPAROSCOPIC CHOLECYSTECTOMY;  Surgeon: Greer Pickerel, MD;  Location: [facility];  Service: General;  Laterality: N/A;  . CYSTOSCOPY  01/23/2011   Procedure: CYSTOSCOPY;  Surgeon: Molli Hazard, MD;  [facility];  Service: Urology;  Laterality: N/A;  . FLEXIBLE SIGMOIDOSCOPY  01/07/2012   Procedure: FLEXIBLE SIGMOIDOSCOPY;  Surgeon: Ladene Artist, MD,FACG;  Location: [facility];  Service: Endoscopy;  Laterality: N/A;  . HERNIA REPAIR  1990   right and left inguinal  . NISSEN FUNDOPLICATION    . 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 TONSILLECTOMY  1937  . TRANSURETHRAL RESECTION OF PROSTATE  01/23/2011   Procedure: TRANSURETHRAL RESECTION OF THE PROSTATE WITH GYRUS INSTRUMENTS;  Surgeon: Molli Hazard, MD;  Location: [facility];  Service: Urology;  Laterality: N/A;  . VASECTOMY  1972    Family History  Problem Relation Age of Onset  . Heart disease Mother   . Emphysema Brother   .  Cancer Daughter        breast    No Known Allergies  Current Outpatient Medications on File Prior to Visit  Medication Sig Dispense Refill  . albuterol (PROVENTIL HFA;VENTOLIN HFA) 108 (90 Base) MCG/ACT inhaler Inhale 2 puffs into the lungs every 6 (six) hours as needed for wheezing or shortness of breath. 1 Inhaler 2  . aspirin 81 MG tablet Take 1 tablet (81 mg total) by mouth every morning. Stop for 1 week and then resume (Patient taking differently: Take 81 mg by mouth every morning. )    . atorvastatin (LIPITOR) 40 MG tablet TAKE 1 TABLET BY MOUTH ONCE DAILY IN THE MORNING 90 tablet 0  . benzonatate (TESSALON) 100 MG capsule Take 1 capsule (100 mg total) by mouth 3 (three) times daily as needed for cough. 30 capsule 0  . celecoxib (CELEBREX) 200 MG capsule TAKE 1 CAPSULE BY MOUTH ONCE DAILY IN THE EVENING 30 capsule 0  . cholecalciferol (VITAMIN D) 1000 UNITS tablet Take 2,000 Units by mouth 2 (two) times daily.     . clonazePAM (KLONOPIN) 0.25 MG disintegrating tablet 1 tab po q hs prn insomnia or anxiety 10 tablet 2  . enalapril (VASOTEC) 2.5 MG tablet Take 2.5 mg by mouth every morning. Reported on 07/12/2015    . fluticasone (FLOVENT HFA) 110 MCG/ACT inhaler Inhale 2  puffs into the lungs 2 (two) times daily. 1 Inhaler 0  . glucose blood (BAYER CONTOUR TEST) test strip Use as instructed to check blood sugar twice a day.  DX  E11.9 100 each 5  . Indacaterol Maleate (ARCAPTA NEOHALER) 75 MCG CAPS Place 1 capsule into inhaler and inhale every morning. 90 capsule 1  . ketoconazole (NIZORAL) 2 % cream As  Directed.  0  . ketoconazole (NIZORAL) 2 % shampoo As directed.  0  . levofloxacin (LEVAQUIN) 500 MG tablet Take 1 tablet (500 mg total) by mouth daily. 10 tablet 0  . lidocaine (LIDODERM) 5 % Place 1 patch onto the skin daily. Remove & Discard patch within 12 hours or as directed by MD 30 patch 0  . meclizine (ANTIVERT) 12.5 MG tablet Take 1 tablet (12.5 mg total) by mouth 3 (three) times daily as needed for dizziness. 30 tablet 0  . metFORMIN (GLUCOPHAGE) 500 MG tablet Take 1 tablet (500 mg total) by mouth 3 (three) times daily. 270 tablet 0  . metFORMIN (GLUCOPHAGE) 500 MG tablet TAKE 1 TABLET BY MOUTH THREE TIMES DAILY 90 tablet 0  . misoprostol (CYTOTEC) 100 MCG tablet TAKE 1 TABLET BY MOUTH TWICE DAILY IN THE MORNING AND IN THE EVENING AND 2 AT BEDTIME 120 tablet 0  . Multiple Vitamins-Minerals (CENTRUM) tablet Take 1 tablet by mouth every morning. Reported on 01/05/2015    . ofloxacin (OCUFLOX) 0.3 % ophthalmic solution INSTILL 1 DROP TWICE DAILY INTO OPERATIVE EYE STARTING 2 DAYS PRIOR TO SURGERY AND AFTER SURGERY FOR 3 WEEKS    . ofloxacin (OCUFLOX) 0.3 % ophthalmic solution Instill 1 drop BID in operative eye starting 2 days prior to surgery and after surgery for 3 weeks.    Marland Kitchen OVER THE COUNTER MEDICATION Place 1-2 drops into both eyes daily as needed (dry red eyes.). Reported on 01/05/2015    . pioglitazone (ACTOS) 15 MG tablet Take 1 tablet by mouth once daily 90 tablet 0  . Tiotropium Bromide-Olodaterol (STIOLTO RESPIMAT) 2.5-2.5 MCG/ACT AERS Inhale 2 puffs into the lungs daily. 4 g 5  . tobramycin (TOBREX) 0.3 % ophthalmic solution  Place 2 drops into the  left eye every 6 (six) hours. 5 mL 0  . UNABLE TO FIND CPAP    . venlafaxine XR (EFFEXOR-XR) 37.5 MG 24 hr capsule TAKE 1 CAPSULE BY MOUTH ONCE DAILY WITH BREAKFAST 90 capsule 0  . vitamin C (ASCORBIC ACID) 500 MG tablet Take 500 mg by mouth every morning.      No current facility-administered medications on file prior to visit.     There were no vitals taken for this visit.      Objective:   Physical Exam        Assessment & Plan:

## 2018-05-21 ENCOUNTER — Telehealth: Payer: Self-pay

## 2018-05-21 NOTE — Telephone Encounter (Signed)
Copied from [place] [phone]. Topic: General - Other >> May 20, 2018  3:47 PM Oneta Rack wrote: Relation to pt: self  Call back [phone]   Reason for call:  Patient wanted to inform PCP he was unable to take BP reading due to not having batteries. Patient doesn't know when he will get batteries but wanted to make you aware.

## 2018-05-27 ENCOUNTER — Telehealth: Payer: Self-pay | Admitting: Podiatry

## 2018-05-27 ENCOUNTER — Other Ambulatory Visit: Payer: Self-pay | Admitting: Medical

## 2018-05-27 NOTE — Telephone Encounter (Signed)
Pt called asking about status of his diabetic shoes that were to be ordered last November.  I looked in chart and noticed he was to get an appt with an MD/DO at the pcp office (Currently seeing a PA) when I spoke to him in February and I have not heard anything.  I explained that pt must have an appt with a MD/DO at his pcp office where Hattie Perch PA works to sign the paperwork per Medicare guidelines and to call and let me know who and when he is seeing them so I can send the paperwork to that MD/DO. He then asked me to not let it slip thru the cracks again. I explained that I need to know the doctor's name so the paperwork can be sent there.

## 2018-05-28 ENCOUNTER — Ambulatory Visit: Payer: Medicare Other | Admitting: Podiatry

## 2018-05-28 ENCOUNTER — Telehealth: Payer: Self-pay

## 2018-05-28 NOTE — Telephone Encounter (Signed)
Copied from [place] [id number]. Topic: Appointment Scheduling - Scheduling Inquiry for Clinic >> May 27, 2018  2:48 PM Sheran Luz wrote: Reason for CRM: Patient would like to schedule appointment. Attempted to reach office x3

## 2018-05-28 NOTE — Telephone Encounter (Signed)
Patient needed to come in for diabetic shoe fitting. He states this evaluation needs to be preformed by MD or DO. I have scheduled him in person to see Dr. Larose Kells tomorrow at 2:20. No symptoms of covid-19.

## 2018-05-29 ENCOUNTER — Ambulatory Visit (INDEPENDENT_AMBULATORY_CARE_PROVIDER_SITE_OTHER): Payer: Medicare Other | Admitting: Internal Medicine

## 2018-05-29 ENCOUNTER — Encounter: Payer: Self-pay | Admitting: Internal Medicine

## 2018-05-29 ENCOUNTER — Ambulatory Visit: Payer: Medicare Other | Admitting: Internal Medicine

## 2018-05-29 ENCOUNTER — Other Ambulatory Visit: Payer: Self-pay

## 2018-05-29 VITALS — BP 114/59 | HR 106 | Temp 97.9°F | Resp 16 | Ht 72.0 in | Wt 260.1 lb

## 2018-05-29 DIAGNOSIS — M1611 Unilateral primary osteoarthritis, right hip: Secondary | ICD-10-CM | POA: Diagnosis not present

## 2018-05-29 DIAGNOSIS — E1142 Type 2 diabetes mellitus with diabetic polyneuropathy: Secondary | ICD-10-CM | POA: Diagnosis not present

## 2018-05-29 NOTE — Patient Instructions (Signed)
Please see [name] as recommended

## 2018-05-29 NOTE — Progress Notes (Signed)
Subjective:    Patient ID: Lance Powell, male    DOB: 12/28/1929, 85 y.o.   MRN: [id number]  DOS:  05/29/2018 Type of visit - description:     Review of Systems   Past Medical History:  Diagnosis Date  . 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 COPD (chronic obstructive pulmonary disease) ([place])   . Depression 03/08/2014  . Diabetes mellitus   . GERD (gastroesophageal reflux disease) 03/08/2014  . History of blood transfusion   . Hyperlipemia   . Hypertension   . OSA (obstructive sleep apnea)   . Osteoarthritis   . Rheumatoid arthritis(714.0)   . Sleep apnea    cpap    Past Surgical History:  Procedure Laterality Date  . APPENDECTOMY  1969  . CHOLECYSTECTOMY N/A 06/16/2014   Procedure: LAPAROSCOPIC CHOLECYSTECTOMY;  Surgeon: Greer Pickerel, MD;  Location: [facility];  Service: General;  Laterality: N/A;  . CYSTOSCOPY  01/23/2011   Procedure: CYSTOSCOPY;  Surgeon: Molli Hazard, MD;  Location: [facility];  Service: Urology;  Laterality: N/A;  . FLEXIBLE SIGMOIDOSCOPY  01/07/2012   Procedure: FLEXIBLE SIGMOIDOSCOPY;  Surgeon: Ladene Artist, MD,FACG;  Location: [facility];  Service: Endoscopy;  Laterality: N/A;  . HERNIA REPAIR  1990   right and left inguinal  . NISSEN FUNDOPLICATION    . 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 TONSILLECTOMY  1937  . TRANSURETHRAL RESECTION OF PROSTATE  01/23/2011   Procedure: TRANSURETHRAL RESECTION OF THE PROSTATE WITH GYRUS INSTRUMENTS;  Surgeon: Molli Hazard, MD;  Location: [facility];  Service: Urology;  Laterality: N/A;  . VASECTOMY  1972    Social History   Socioeconomic History  . Marital status: Married    Spouse name: Not on file  . Number of children: 6  . Years of education: Not on file  . Highest education level: Not on file  Occupational History  . Occupation: Retired    Comment: Glass blower/designer  . Financial resource strain: Not on file  . Food insecurity:    Worry:  Not on file    Inability: Not on file  . Transportation needs:    Medical: Not on file    Non-medical: Not on file  Tobacco Use  . Smoking status: Former Smoker    Packs/day: 4.00    Years: 30.00    Pack years: 120.00    Types: Cigarettes    Last attempt to quit: 01/21/1978    Years since quitting: 40.3  . Smokeless tobacco: Never Used  Substance and Sexual Activity  . Alcohol use: No  . Drug use: No  . Sexual activity: Not on file  Lifestyle  . Physical activity:    Days per week: Not on file    Minutes per session: Not on file  . Stress: Not on file  Relationships  . Social connections:    Talks on phone: Not on file    Gets together: Not on file    Attends religious service: Not on file    Active member of club or organization: Not on file    Attends meetings of clubs or organizations: Not on file    Relationship status: Not on file  . Intimate partner violence:    Fear of current or ex partner: Not on file    Emotionally abused: Not on file    Physically abused: Not on file    Forced sexual activity: Not on file  Other Topics  Concern  . Not on file  Social History Narrative  . Not on file      Allergies as of 05/29/2018   No Known Allergies     Medication List       Accurate as of May 29, 2018  9:39 AM. If you have any questions, ask your nurse or doctor.        albuterol 108 (90 Base) MCG/ACT inhaler Commonly known as:  VENTOLIN HFA Inhale 2 puffs into the lungs every 6 (six) hours as needed for wheezing or shortness of breath.   aspirin 81 MG tablet Take 1 tablet (81 mg total) by mouth every morning. Stop for 1 week and then resume What changed:  additional instructions   atorvastatin 40 MG tablet Commonly known as:  LIPITOR TAKE 1 TABLET BY MOUTH ONCE DAILY IN THE MORNING   benzonatate 100 MG capsule Commonly known as:  TESSALON Take 1 capsule (100 mg total) by mouth 3 (three) times daily as needed for cough.   celecoxib 200 MG capsule Commonly  known as:  CELEBREX TAKE 1 CAPSULE BY MOUTH ONCE DAILY IN THE EVENING   Centrum tablet Take 1 tablet by mouth every morning. Reported on 01/05/2015   cholecalciferol 1000 units tablet Commonly known as:  VITAMIN D Take 2,000 Units by mouth 2 (two) times daily.   clonazePAM 0.25 MG disintegrating tablet Commonly known as:  KLONOPIN 1 tab po q hs prn insomnia or anxiety   enalapril 2.5 MG tablet Commonly known as:  VASOTEC Take 2.5 mg by mouth every morning. Reported on 07/12/2015   fluticasone 110 MCG/ACT inhaler Commonly known as:  Flovent HFA Inhale 2 puffs into the lungs 2 (two) times daily.   glucose blood test strip Commonly known as:  Estate manager/land agent Use as instructed to check blood sugar twice a day.  DX  E11.9   Indacaterol Maleate 75 MCG Caps Commonly known as:  Conservator, museum/gallery Place 1 capsule into inhaler and inhale every morning.   ketoconazole 2 % cream Commonly known as:  NIZORAL As  Directed.   ketoconazole 2 % shampoo Commonly known as:  NIZORAL As directed.   levofloxacin 500 MG tablet Commonly known as:  LEVAQUIN Take 1 tablet (500 mg total) by mouth daily.   lidocaine 5 % Commonly known as:  LIDODERM Place 1 patch onto the skin daily. Remove & Discard patch within 12 hours or as directed by MD   meclizine 12.5 MG tablet Commonly known as:  ANTIVERT Take 1 tablet (12.5 mg total) by mouth 3 (three) times daily as needed for dizziness.   metFORMIN 500 MG tablet Commonly known as:  GLUCOPHAGE Take 1 tablet (500 mg total) by mouth 3 (three) times daily. What changed:  Another medication with the same name was removed. Continue taking this medication, and follow the directions you see here. Changed by:  Kathlene November, MD   misoprostol 100 MCG tablet Commonly known as:  CYTOTEC TAKE 1 TABLET BY MOUTH TWICE DAILY IN THE MORNING AND IN THE EVENING AND 2 AT BEDTIME   ofloxacin 0.3 % ophthalmic solution Commonly known as:  OCUFLOX INSTILL 1 DROP TWICE  DAILY INTO OPERATIVE EYE STARTING 2 DAYS PRIOR TO SURGERY AND AFTER SURGERY FOR 3 WEEKS   ofloxacin 0.3 % ophthalmic solution Commonly known as:  OCUFLOX Instill 1 drop BID in operative eye starting 2 days prior to surgery and after surgery for 3 weeks.   OVER THE COUNTER MEDICATION Place 1-2 drops into  both eyes daily as needed (dry red eyes.). Reported on 01/05/2015   pioglitazone 15 MG tablet Commonly known as:  ACTOS Take 1 tablet by mouth once daily   Tiotropium Bromide-Olodaterol 2.5-2.5 MCG/ACT Aers Commonly known as:  Stiolto Respimat Inhale 2 puffs into the lungs daily.   tobramycin 0.3 % ophthalmic solution Commonly known as:  Tobrex Place 2 drops into the left eye every 6 (six) hours.   UNABLE TO FIND CPAP   venlafaxine XR 37.5 MG 24 hr capsule Commonly known as:  EFFEXOR-XR TAKE 1 CAPSULE BY MOUTH ONCE DAILY WITH BREAKFAST   vitamin C 500 MG tablet Commonly known as:  ASCORBIC ACID Take 500 mg by mouth every morning.           Objective:   Physical Exam BP (!) 114/59 (BP Location: Left Arm, Patient Position: Sitting, Cuff Size: Normal)   Pulse (!) 106   Temp 97.9 F (36.6 C) (Oral)   Resp 16   Ht 6' (1.829 m)   Wt 260 lb 2 oz (118 kg)   SpO2 96%   BMI 35.28 kg/m      Assessment

## 2018-05-29 NOTE — Progress Notes (Signed)
Subjective:    Patient ID: Lance Powell, male    DOB: [birth year], 85 y.o.   MRN: [id number]  DOS:  05/29/2018 Type of visit - description:  Acute visit, face-to-face Here to discuss diabetic neuropathy. The patient in general is a stable, reports occasional left foot numbness, not particularly worse at night. No foot pain perse Medication list reviewed  Review of Systems Denies fever chills Respiratory symptoms at baseline.   Past Medical History:  Diagnosis Date  . 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 COPD (chronic obstructive pulmonary disease) ([place])   . Depression 03/08/2014  . Diabetes mellitus   . GERD (gastroesophageal reflux disease) 03/08/2014  . History of blood transfusion   . Hyperlipemia   . Hypertension   . OSA (obstructive sleep apnea)   . Osteoarthritis   . Rheumatoid arthritis(714.0)   . Sleep apnea    cpap    Past Surgical History:  Procedure Laterality Date  . APPENDECTOMY  1969  . CHOLECYSTECTOMY N/A 06/16/2014   Procedure: LAPAROSCOPIC CHOLECYSTECTOMY;  Surgeon: Greer Pickerel, MD;  Location: [facility];  Service: General;  Laterality: N/A;  . CYSTOSCOPY  01/23/2011   Procedure: CYSTOSCOPY;  Surgeon: Molli Hazard, MD;  Location: [facility];  Service: Urology;  Laterality: N/A;  . FLEXIBLE SIGMOIDOSCOPY  01/07/2012   Procedure: FLEXIBLE SIGMOIDOSCOPY;  Surgeon: Ladene Artist, MD,FACG;  Location: [facility];  Service: Endoscopy;  Laterality: N/A;  . HERNIA REPAIR  1990   right and left inguinal  . NISSEN FUNDOPLICATION    . 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 TONSILLECTOMY  1937  . TRANSURETHRAL RESECTION OF PROSTATE  01/23/2011   Procedure: TRANSURETHRAL RESECTION OF THE PROSTATE WITH GYRUS INSTRUMENTS;  Surgeon: Molli Hazard, MD;  Location[facility];  Service: Urology;  Laterality: N/A;  . VASECTOMY  1972    Social History   Socioeconomic History  . Marital status: Married    Spouse name: Not  on file  . Number of children: 6  . Years of education: Not on file  . Highest education level: Not on file  Occupational History  . Occupation: Retired    Comment: Glass blower/designer  . Financial resource strain: Not on file  . Food insecurity:    Worry: Not on file    Inability: Not on file  . Transportation needs:    Medical: Not on file    Non-medical: Not on file  Tobacco Use  . Smoking status: Former Smoker    Packs/day: 4.00    Years: 30.00    Pack years: 120.00    Types: Cigarettes    Last attempt to quit: 01/21/1978    Years since quitting: 40.3  . Smokeless tobacco: Never Used  Substance and Sexual Activity  . Alcohol use: No  . Drug use: No  . Sexual activity: Not on file  Lifestyle  . Physical activity:    Days per week: Not on file    Minutes per session: Not on file  . Stress: Not on file  Relationships  . Social connections:    Talks on phone: Not on file    Gets together: Not on file    Attends religious service: Not on file    Active member of club or organization: Not on file    Attends meetings of clubs or organizations: Not on file    Relationship status: Not on file  . Intimate partner violence:  Fear of current or ex partner: Not on file    Emotionally abused: Not on file    Physically abused: Not on file    Forced sexual activity: Not on file  Other Topics Concern  . Not on file  Social History Narrative  . Not on file      Allergies as of 05/29/2018   No Known Allergies     Medication List       Accurate as of May 29, 2018  9:52 AM. If you have any questions, ask your nurse or doctor.        STOP taking these medications   levofloxacin 500 MG tablet Commonly known as:  LEVAQUIN Stopped by:  Kathlene November, MD   ofloxacin 0.3 % ophthalmic solution Commonly known as:  OCUFLOX Stopped by:  Kathlene November, MD   tobramycin 0.3 % ophthalmic solution Commonly known as:  Tobrex Stopped by:  Kathlene November, MD     TAKE these medications    albuterol 108 (90 Base) MCG/ACT inhaler Commonly known as:  VENTOLIN HFA Inhale 2 puffs into the lungs every 6 (six) hours as needed for wheezing or shortness of breath.   aspirin 81 MG tablet Take 1 tablet (81 mg total) by mouth every morning. Stop for 1 week and then resume What changed:  additional instructions   atorvastatin 40 MG tablet Commonly known as:  LIPITOR TAKE 1 TABLET BY MOUTH ONCE DAILY IN THE MORNING   benzonatate 100 MG capsule Commonly known as:  TESSALON Take 1 capsule (100 mg total) by mouth 3 (three) times daily as needed for cough.   celecoxib 200 MG capsule Commonly known as:  CELEBREX TAKE 1 CAPSULE BY MOUTH ONCE DAILY IN THE EVENING   Centrum tablet Take 1 tablet by mouth every morning. Reported on 01/05/2015   cholecalciferol 1000 units tablet Commonly known as:  VITAMIN D Take 2,000 Units by mouth 2 (two) times daily.   clonazePAM 0.25 MG disintegrating tablet Commonly known as:  KLONOPIN 1 tab po q hs prn insomnia or anxiety   enalapril 2.5 MG tablet Commonly known as:  VASOTEC Take 2.5 mg by mouth every morning. Reported on 07/12/2015   fluticasone 110 MCG/ACT inhaler Commonly known as:  Flovent HFA Inhale 2 puffs into the lungs 2 (two) times daily.   glucose blood test strip Commonly known as:  Estate manager/land agent Use as instructed to check blood sugar twice a day.  DX  E11.9   Indacaterol Maleate 75 MCG Caps Commonly known as:  Conservator, museum/gallery Place 1 capsule into inhaler and inhale every morning.   ketoconazole 2 % cream Commonly known as:  NIZORAL As  Directed.   ketoconazole 2 % shampoo Commonly known as:  NIZORAL As directed.   lidocaine 5 % Commonly known as:  LIDODERM Place 1 patch onto the skin daily. Remove & Discard patch within 12 hours or as directed by MD   meclizine 12.5 MG tablet Commonly known as:  ANTIVERT Take 1 tablet (12.5 mg total) by mouth 3 (three) times daily as needed for dizziness.   metFORMIN 500  MG tablet Commonly known as:  GLUCOPHAGE Take 1 tablet (500 mg total) by mouth 3 (three) times daily. What changed:  Another medication with the same name was removed. Continue taking this medication, and follow the directions you see here. Changed by:  Kathlene November, MD   misoprostol 100 MCG tablet Commonly known as:  CYTOTEC TAKE 1 TABLET BY MOUTH TWICE DAILY IN  THE MORNING AND IN THE EVENING AND 2 AT BEDTIME   OVER THE COUNTER MEDICATION Place 1-2 drops into both eyes daily as needed (dry red eyes.). Reported on 01/05/2015   pioglitazone 15 MG tablet Commonly known as:  ACTOS Take 1 tablet by mouth once daily   Tiotropium Bromide-Olodaterol 2.5-2.5 MCG/ACT Aers Commonly known as:  Stiolto Respimat Inhale 2 puffs into the lungs daily.   UNABLE TO FIND CPAP   venlafaxine XR 37.5 MG 24 hr capsule Commonly known as:  EFFEXOR-XR TAKE 1 CAPSULE BY MOUTH ONCE DAILY WITH BREAKFAST   vitamin C 500 MG tablet Commonly known as:  ASCORBIC ACID Take 500 mg by mouth every morning.           Objective:   Physical Exam BP (!) 114/59 (BP Location: Left Arm, Patient Position: Sitting, Cuff Size: Normal)   Pulse (!) 106   Temp 97.9 F (36.6 C) (Oral)   Resp 16   Ht 6' (1.829 m)   Wt 260 lb 2 oz (118 kg)   SpO2 96%   BMI 35.28 kg/m  General:   Well developed, NAD, BMI noted. HEENT:  Normocephalic . Face symmetric, atraumatic Diabetic foot exam: No edema, skin normal. Nails not dystrophic Good pedal pulses bilaterally Pinprick examination: Right foot normal, left foot: Slightly decreased distally. + Fifth toe deformities bilaterally Neurologic:  alert & oriented X3.  Speech normal, gait appropriate   Psych--  Cognition and judgment appear intact.  Cooperative with normal attention span and concentration.  Behavior appropriate. No anxious or depressed appearing.      Assessment    85 year old gentleman,PMH includes COPD, OSA, history of SBO, DM, high cholesterol,  evaluated today for:  Diabetes: Currently on metformin, Actos, well controlled with the last A1c of 6.9. Diabetes neuropathy: Evidence of neuropathy by pinprick examination and mild symptoms. Documented fifth toe deformities bilaterally. RTC to see PCP as recommended

## 2018-06-08 ENCOUNTER — Telehealth: Payer: Self-pay | Admitting: Podiatry

## 2018-06-08 ENCOUNTER — Other Ambulatory Visit: Payer: Self-pay | Admitting: Medical

## 2018-06-08 NOTE — Telephone Encounter (Signed)
Pt lefr t message which was hard to understand and then daughter left a message asking for a call back to pt about diabetic shoe paperwork and if it was completed.Pt has an appt tomorrow to see the doctor here.  I returned call and explained we had not received the paperwork but I manually faxed today to make sure it was sent and will call to make sure we get it back asap.

## 2018-06-09 ENCOUNTER — Other Ambulatory Visit: Payer: Self-pay

## 2018-06-09 ENCOUNTER — Ambulatory Visit (INDEPENDENT_AMBULATORY_CARE_PROVIDER_SITE_OTHER): Payer: Medicare Other | Admitting: Podiatry

## 2018-06-09 ENCOUNTER — Encounter: Payer: Self-pay | Admitting: Podiatry

## 2018-06-09 VITALS — Temp 97.5°F

## 2018-06-09 DIAGNOSIS — M79675 Pain in left toe(s): Secondary | ICD-10-CM | POA: Diagnosis not present

## 2018-06-09 DIAGNOSIS — B351 Tinea unguium: Secondary | ICD-10-CM | POA: Diagnosis not present

## 2018-06-09 DIAGNOSIS — E1151 Type 2 diabetes mellitus with diabetic peripheral angiopathy without gangrene: Secondary | ICD-10-CM | POA: Diagnosis not present

## 2018-06-09 DIAGNOSIS — M2041 Other hammer toe(s) (acquired), right foot: Secondary | ICD-10-CM

## 2018-06-09 DIAGNOSIS — M2042 Other hammer toe(s) (acquired), left foot: Secondary | ICD-10-CM

## 2018-06-09 DIAGNOSIS — M79674 Pain in right toe(s): Secondary | ICD-10-CM

## 2018-06-09 NOTE — Progress Notes (Signed)
Complaint:  Visit Type: Patient returns to my office for continued preventative foot care services. Complaint: Patient states" my nails have grown long and thick and become painful to walk and wear shoes" Patient has been diagnosed with DM with angiopathy.. The patient presents for preventative foot care services. No changes to ROS  Podiatric Exam: Vascular: dorsalis pedis  are palpable bilateral.  Posterior tibial pulses are absent bilateral. Capillary return is immediate. Temperature gradient is WNL. Skin turgor WNL  Sensorium: Normal Semmes Weinstein monofilament test. Normal tactile sensation bilaterally. Nail Exam: Pt has thick disfigured discolored nails with subungual debris noted bilateral  nail hallux  Ulcer Exam: There is no evidence of ulcer or pre-ulcerative changes or infection. Orthopedic Exam: Muscle tone and strength are WNL. No limitations in general ROM. No crepitus or effusions noted. Foot type and digits show no abnormalities. Bony prominences are unremarkable. Skin: No Porokeratosis. No infection or ulcers  Diagnosis:  Onychomycosis, , Pain in right toe, pain in left toes  Treatment & Plan Procedures and Treatment: Consent by patient was obtained for treatment procedures.   Debridement of mycotic and hypertrophic toenails, 1 through 5 bilateral and clearing of subungual debris. No ulceration, no infection noted.  Return Visit-Office Procedure: Patient instructed to return to the office for a follow up visit 3 months for continued evaluation and treatment.    Gardiner Barefoot DPM

## 2018-06-11 ENCOUNTER — Telehealth: Payer: Self-pay | Admitting: Podiatry

## 2018-06-11 ENCOUNTER — Telehealth: Payer: Self-pay

## 2018-06-11 NOTE — Telephone Encounter (Signed)
[employer] got the forms and I have called pt to let him know and that it should be about 2 wks for them to make the inserts and ship them too [place]. I told pt I would call pt when they come in to schedule his appt to pick themup.

## 2018-06-11 NOTE — Telephone Encounter (Signed)
Form w/ Dr. Larose Kells.

## 2018-06-11 NOTE — Telephone Encounter (Signed)
Forms Faxed

## 2018-06-11 NOTE — Telephone Encounter (Signed)
Copied from [place] [id number]. Topic: General - Inquiry >> Jun 11, 2018  9:11 AM Richardo Priest, NT wrote: Reason for CRM: Arrie Aran is calling in to check on fax for diabetic shoes order for this patient. States they have faxed it twice. Call back is [phone], ask for [facility].

## 2018-06-11 NOTE — Telephone Encounter (Signed)
signed

## 2018-06-11 NOTE — Telephone Encounter (Signed)
Spoke to Dr Ethel Rana office checking to see where they are on the diabetic shoe paperwork and they are checking on it and will let me know.

## 2018-06-18 DIAGNOSIS — Z961 Presence of intraocular lens: Secondary | ICD-10-CM | POA: Diagnosis not present

## 2018-06-21 ENCOUNTER — Other Ambulatory Visit: Payer: Self-pay | Admitting: Medical

## 2018-06-25 ENCOUNTER — Telehealth: Payer: Self-pay | Admitting: Medical

## 2018-06-25 NOTE — Telephone Encounter (Signed)
Diabetic foot form faxed and signed by Dr. Larose Kells per chart review. Shredding duplicate that was sent.

## 2018-06-26 DIAGNOSIS — J449 Chronic obstructive pulmonary disease, unspecified: Secondary | ICD-10-CM | POA: Diagnosis not present

## 2018-06-26 DIAGNOSIS — G4733 Obstructive sleep apnea (adult) (pediatric): Secondary | ICD-10-CM | POA: Diagnosis not present

## 2018-07-02 ENCOUNTER — Other Ambulatory Visit: Payer: Self-pay

## 2018-07-02 ENCOUNTER — Ambulatory Visit: Payer: Medicare Other | Admitting: Orthotics

## 2018-07-02 DIAGNOSIS — M2041 Other hammer toe(s) (acquired), right foot: Secondary | ICD-10-CM

## 2018-07-02 DIAGNOSIS — M79674 Pain in right toe(s): Secondary | ICD-10-CM

## 2018-07-02 DIAGNOSIS — E1151 Type 2 diabetes mellitus with diabetic peripheral angiopathy without gangrene: Secondary | ICD-10-CM

## 2018-07-02 DIAGNOSIS — B351 Tinea unguium: Secondary | ICD-10-CM

## 2018-07-02 NOTE — Progress Notes (Signed)
Reordered shoes, patient not wanting lace up

## 2018-07-06 ENCOUNTER — Telehealth: Payer: Self-pay | Admitting: Podiatry

## 2018-07-06 NOTE — Telephone Encounter (Signed)
pts daughter called and left message stating she had some questions about the diabetic shoes/inserts.  Returned call and they are asking the cost of the shoes and inserts. I explained that we bill insurance 190.00 for 1 pr of shoes and 414.00 for 3 pairs of inserts. Not sure if his plan covers more than the 80% that medicare covers. I will give them the form when they come in.. Shoes look like they are coming in tomorrow and I will call her when they come in and we have scheduled him to come in Thursday.

## 2018-07-07 NOTE — Telephone Encounter (Signed)
Notified pts daughter shoes did come in today and to keep appt for 6.18.2020.

## 2018-07-09 ENCOUNTER — Other Ambulatory Visit: Payer: Self-pay

## 2018-07-09 ENCOUNTER — Ambulatory Visit: Payer: Medicare Other | Admitting: Orthotics

## 2018-07-09 ENCOUNTER — Telehealth: Payer: Self-pay | Admitting: Medical

## 2018-07-09 DIAGNOSIS — Q828 Other specified congenital malformations of skin: Secondary | ICD-10-CM

## 2018-07-09 DIAGNOSIS — M79674 Pain in right toe(s): Secondary | ICD-10-CM

## 2018-07-09 DIAGNOSIS — E1151 Type 2 diabetes mellitus with diabetic peripheral angiopathy without gangrene: Secondary | ICD-10-CM

## 2018-07-09 DIAGNOSIS — B351 Tinea unguium: Secondary | ICD-10-CM

## 2018-07-09 NOTE — Telephone Encounter (Signed)
Eric with [employer] calling and states that the patient is requesting oxygen/supplies from their company. States that they are needing some type of documentation proving that the patient is on oxygen, whether it be an order for oxygen or OV notes stating that patient uses oxygen. Please advise.  Fax#: [phone] Attn eric CB#: [phone]

## 2018-07-09 NOTE — Progress Notes (Signed)
Shoes too big...reordering size smaller

## 2018-07-10 NOTE — Telephone Encounter (Signed)
I can't print and fax a note from another office the pulmonary office would have to do that

## 2018-07-10 NOTE — Telephone Encounter (Signed)
Would you see pec note. Can you fax his most recent pulmonologist note. It should have information on his condition on 02 use.

## 2018-07-10 NOTE — Telephone Encounter (Signed)
The 02-18-2018 note I wrote makes comment of copd and on 02. So would you print and fax that.  Since can't sent pulmonology note.

## 2018-07-13 ENCOUNTER — Other Ambulatory Visit: Payer: Self-pay | Admitting: Medical

## 2018-07-15 NOTE — Telephone Encounter (Signed)
Faxed note 02/18/18 to [place]

## 2018-07-16 ENCOUNTER — Telehealth: Payer: Self-pay | Admitting: Podiatry

## 2018-07-16 NOTE — Telephone Encounter (Signed)
Left message for pt to call to schedule an appt to pick up reordered shoes that have come in.Marland KitchenMarland Kitchen

## 2018-07-21 NOTE — Telephone Encounter (Signed)
Pt returned call and is aware we have the diabetic shoes for him but stated he will not be able to come in until august. I told pt that his paperwork for the shoes expires 8.21.2020.he is going to call back to schedule an appt.

## 2018-07-23 ENCOUNTER — Telehealth: Payer: Self-pay | Admitting: Medical

## 2018-07-23 NOTE — Telephone Encounter (Signed)
Called pt concering CRM to rescd an appt. Possibly the AWV.Marland Kitchen not sure .Marland Kitchen Patient told me he was taking a nap and will call me back

## 2018-07-27 ENCOUNTER — Other Ambulatory Visit: Payer: Self-pay | Admitting: Medical

## 2018-08-13 ENCOUNTER — Other Ambulatory Visit: Payer: Self-pay | Admitting: Medical

## 2018-08-29 ENCOUNTER — Other Ambulatory Visit: Payer: Self-pay | Admitting: Medical

## 2018-09-01 ENCOUNTER — Ambulatory Visit (INDEPENDENT_AMBULATORY_CARE_PROVIDER_SITE_OTHER): Payer: Medicare Other | Admitting: Medical

## 2018-09-01 ENCOUNTER — Encounter: Payer: Self-pay | Admitting: Medical

## 2018-09-01 ENCOUNTER — Other Ambulatory Visit: Payer: Self-pay

## 2018-09-01 VITALS — BP 143/66 | HR 108 | Temp 96.8°F | Resp 16 | Ht 72.0 in | Wt 250.8 lb

## 2018-09-01 DIAGNOSIS — E119 Type 2 diabetes mellitus without complications: Secondary | ICD-10-CM | POA: Diagnosis not present

## 2018-09-01 DIAGNOSIS — E785 Hyperlipidemia, unspecified: Secondary | ICD-10-CM | POA: Diagnosis not present

## 2018-09-01 DIAGNOSIS — L989 Disorder of the skin and subcutaneous tissue, unspecified: Secondary | ICD-10-CM | POA: Diagnosis not present

## 2018-09-01 DIAGNOSIS — B49 Unspecified mycosis: Secondary | ICD-10-CM | POA: Diagnosis not present

## 2018-09-01 MED ORDER — NYSTATIN 100000 UNIT/GM EX CREA: 1.0000 "application " | TOPICAL_CREAM | Freq: Two times a day (BID) | CUTANEOUS | 1 refills | Status: DC

## 2018-09-01 NOTE — Patient Instructions (Addendum)
You do appear to have fungal infection in groin area. Will rx nystatin to use twice a day.  The area on your rt arm maybe dermatofibroma vs bullous pemphygoid. Not clear. Derm referral offered but declined. Watch area for changes as explained.(size, shape or color). If changes return to clinic /let me know.  For diabetes and high cholesterol placing future labs.  Flu vaccine mid September.   Follow up in 3 months or possibly sooner after lab review.

## 2018-09-01 NOTE — Progress Notes (Signed)
   Subjective:    Patient ID: Lance Powell, male    DOB: 10/15/1929, 85 y.o.   MRN: [id number]  HPI   Pt in for follow up.  Pt has 2 small raised area on his rt forearm area and elbow area for 6-8 months. He thinks blister appearance. No itching. No pain.   Also has groin area rash that is itching. Mild redness as well over past 2 weeks.  Pt diabetic and last a1c was 6.9. Average at that time 132. Pt shows me glucometer readings and readings all between 100-110.   Review of Systems  Constitutional: Negative for chills, fatigue and fever.  Respiratory: Negative for cough, chest tightness, shortness of breath and wheezing.   Cardiovascular: Negative for chest pain and palpitations.  Gastrointestinal: Negative for abdominal pain.  Musculoskeletal: Negative for back pain.  Neurological: Negative for dizziness, seizures, weakness, numbness and headaches.  Hematological: Negative for adenopathy. Does not bruise/bleed easily.  Psychiatric/Behavioral: Negative for behavioral problems and confusion.       Objective:   Physical Exam   General Mental Status- Alert. General Appearance- Not in acute distress.   Skin General: Color- Normal Color. Moisture- Normal Moisture.  Neck Carotid Arteries- Normal color. Moisture- Normal Moisture. No carotid bruits. No JVD.  Chest and Lung Exam Auscultation: Breath Sounds:-Normal.  Cardiovascular Auscultation:Rythm- Regular. Murmurs & Other Heart Sounds:Auscultation of the heart reveals- No Murmurs.  Abdomen Inspection:-Inspeection Normal. Palpation/Percussion:Note:No mass. Palpation and Percussion of the abdomen reveal- Non Tender, Non Distended + BS, no rebound or guarding.   Neurologic Cranial Nerve exam:- CN III-XII intact(No nystagmus), symmetric smile. strength both upper and lower extremities.  Skin- both groin area beside scrotum mild red and hyperpigmented. Rt forearm- 2 tiny circular raised area. Width about 4-5 mm  diameter. Appearance dermatofobroma vs bullous pemphygoid. Not clear if fluid filled. Uniform in color.      Assessment & Plan:  You do appear to have fungal infection in groin area. Will rx nystatin to use twice a day.  The area on your rt arm maybe dermatofibroma vs bullous pemphygoid.. Not clear. Derm referral offered but declined. Watch area for changes as explained.(size, shape or color). If changes rtc/let me know.  For diabetes and high cholesterol placing future labs.  Flu vaccine mid September.   Follow up in 3 months or possibly sooner after lab review.  25 minutes spent with pt. 50% of time spent discussing dx, tx and answering pt questions.  [name], PA-C

## 2018-09-07 ENCOUNTER — Other Ambulatory Visit (INDEPENDENT_AMBULATORY_CARE_PROVIDER_SITE_OTHER): Payer: Medicare Other

## 2018-09-07 ENCOUNTER — Other Ambulatory Visit: Payer: Self-pay

## 2018-09-07 DIAGNOSIS — E119 Type 2 diabetes mellitus without complications: Secondary | ICD-10-CM | POA: Diagnosis not present

## 2018-09-07 DIAGNOSIS — E785 Hyperlipidemia, unspecified: Secondary | ICD-10-CM

## 2018-09-07 LAB — COMPREHENSIVE METABOLIC PANEL
ALT: 21 U/L (ref 0–53)
AST: 16 U/L (ref 0–37)
Albumin: 4.1 g/dL (ref 3.5–5.2)
Alkaline Phosphatase: 80 U/L (ref 39–117)
BUN: 18 mg/dL (ref 6–23)
CO2: 28 mEq/L (ref 19–32)
Calcium: 9.9 mg/dL (ref 8.4–10.5)
Chloride: 105 mEq/L (ref 96–112)
Creatinine, Ser: 1.1 mg/dL (ref 0.40–1.50)
GFR: 63.33 mL/min (ref >60.00)
Glucose, Bld: 116 mg/dL — ABNORMAL HIGH (ref 70–99)
Potassium: 4.2 mEq/L (ref 3.5–5.1)
Sodium: 142 mEq/L (ref 135–145)
Total Bilirubin: 0.4 mg/dL (ref 0.2–1.2)
Total Protein: 6.5 g/dL (ref 6.0–8.3)

## 2018-09-07 LAB — LIPID PANEL
Cholesterol: 135 mg/dL (ref 0–200)
HDL: 41.8 mg/dL (ref >39.00)
LDL Cholesterol: 76 mg/dL (ref 0–99)
NonHDL: 92.79
Total CHOL/HDL Ratio: 3
Triglycerides: 85 mg/dL (ref 0.0–149.0)
VLDL: 17 mg/dL (ref 0.0–40.0)

## 2018-09-07 LAB — HEMOGLOBIN A1C: Hgb A1c MFr Bld: 6.7 % — ABNORMAL HIGH (ref 4.6–6.5)

## 2018-09-11 ENCOUNTER — Other Ambulatory Visit: Payer: Self-pay | Admitting: Medical

## 2018-09-14 ENCOUNTER — Other Ambulatory Visit: Payer: Self-pay

## 2018-09-14 ENCOUNTER — Emergency Department (HOSPITAL_BASED_OUTPATIENT_CLINIC_OR_DEPARTMENT_OTHER)
Admission: EM | Admit: 2018-09-14 | Discharge: 2018-09-14 | Disposition: A | Discharge status: Home / Self Care | Payer: Medicare Other | Attending: Emergency Medicine | Admitting: Emergency Medicine

## 2018-09-14 ENCOUNTER — Emergency Department (HOSPITAL_BASED_OUTPATIENT_CLINIC_OR_DEPARTMENT_OTHER): Payer: Medicare Other

## 2018-09-14 ENCOUNTER — Telehealth: Payer: Self-pay | Admitting: Medical

## 2018-09-14 ENCOUNTER — Encounter (HOSPITAL_BASED_OUTPATIENT_CLINIC_OR_DEPARTMENT_OTHER): Payer: Self-pay | Admitting: *Deleted

## 2018-09-14 DIAGNOSIS — Y929 Unspecified place or not applicable: Secondary | ICD-10-CM | POA: Insufficient documentation

## 2018-09-14 DIAGNOSIS — E119 Type 2 diabetes mellitus without complications: Secondary | ICD-10-CM | POA: Insufficient documentation

## 2018-09-14 DIAGNOSIS — W010XXA Fall on same level from slipping, tripping and stumbling without subsequent striking against object, initial encounter: Secondary | ICD-10-CM | POA: Diagnosis not present

## 2018-09-14 DIAGNOSIS — Z7984 Long term (current) use of oral hypoglycemic drugs: Secondary | ICD-10-CM | POA: Insufficient documentation

## 2018-09-14 DIAGNOSIS — N3001 Acute cystitis with hematuria: Secondary | ICD-10-CM | POA: Diagnosis not present

## 2018-09-14 DIAGNOSIS — I10 Essential (primary) hypertension: Secondary | ICD-10-CM | POA: Insufficient documentation

## 2018-09-14 DIAGNOSIS — Y939 Activity, unspecified: Secondary | ICD-10-CM | POA: Diagnosis not present

## 2018-09-14 DIAGNOSIS — Z79899 Other long term (current) drug therapy: Secondary | ICD-10-CM | POA: Insufficient documentation

## 2018-09-14 DIAGNOSIS — R42 Dizziness and giddiness: Secondary | ICD-10-CM | POA: Insufficient documentation

## 2018-09-14 DIAGNOSIS — J449 Chronic obstructive pulmonary disease, unspecified: Secondary | ICD-10-CM | POA: Diagnosis not present

## 2018-09-14 DIAGNOSIS — M545 Low back pain, unspecified: Secondary | ICD-10-CM

## 2018-09-14 DIAGNOSIS — Y999 Unspecified external cause status: Secondary | ICD-10-CM | POA: Insufficient documentation

## 2018-09-14 DIAGNOSIS — Z87891 Personal history of nicotine dependence: Secondary | ICD-10-CM | POA: Insufficient documentation

## 2018-09-14 DIAGNOSIS — Z7982 Long term (current) use of aspirin: Secondary | ICD-10-CM | POA: Diagnosis not present

## 2018-09-14 DIAGNOSIS — S3992XA Unspecified injury of lower back, initial encounter: Secondary | ICD-10-CM | POA: Diagnosis not present

## 2018-09-14 DIAGNOSIS — W19XXXA Unspecified fall, initial encounter: Secondary | ICD-10-CM

## 2018-09-14 DIAGNOSIS — R51 Headache: Secondary | ICD-10-CM | POA: Diagnosis not present

## 2018-09-14 DIAGNOSIS — M533 Sacrococcygeal disorders, not elsewhere classified: Secondary | ICD-10-CM | POA: Diagnosis not present

## 2018-09-14 LAB — URINALYSIS, ROUTINE W REFLEX MICROSCOPIC
Bilirubin Urine: NEGATIVE
Glucose, UA: 100 mg/dL — AB
Ketones, ur: NEGATIVE mg/dL
Nitrite: POSITIVE — AB
Protein, ur: NEGATIVE mg/dL
Specific Gravity, Urine: 1.025 (ref 1.005–1.030)
pH: 6 (ref 5.0–8.0)

## 2018-09-14 LAB — URINALYSIS, MICROSCOPIC (REFLEX)

## 2018-09-14 LAB — BASIC METABOLIC PANEL
Anion gap: 9 (ref 5–15)
BUN: 19 mg/dL (ref 8–23)
CO2: 28 mmol/L (ref 22–32)
Calcium: 10 mg/dL (ref 8.9–10.3)
Chloride: 103 mmol/L (ref 98–111)
Creatinine, Ser: 1.08 mg/dL (ref 0.61–1.24)
GFR calc Af Amer: >60 mL/min (ref >60)
GFR calc non Af Amer: >60 mL/min (ref >60)
Glucose, Bld: 109 mg/dL — ABNORMAL HIGH (ref 70–99)
Potassium: 4.5 mmol/L (ref 3.5–5.1)
Sodium: 140 mmol/L (ref 135–145)

## 2018-09-14 LAB — CBC WITH DIFFERENTIAL/PLATELET
Abs Immature Granulocytes: 0.03 10*3/uL (ref 0.00–0.07)
Basophils Absolute: 0.1 10*3/uL (ref 0.0–0.1)
Basophils Relative: 1 %
Eosinophils Absolute: 0.2 10*3/uL (ref 0.0–0.5)
Eosinophils Relative: 2 %
HCT: 46 % (ref 39.0–52.0)
Hemoglobin: 14.5 g/dL (ref 13.0–17.0)
Immature Granulocytes: 0 %
Lymphocytes Relative: 28 %
Lymphs Abs: 2.7 10*3/uL (ref 0.7–4.0)
MCH: 31.2 pg (ref 26.0–34.0)
MCHC: 31.5 g/dL (ref 30.0–36.0)
MCV: 98.9 fL (ref 80.0–100.0)
Monocytes Absolute: 1.1 10*3/uL — ABNORMAL HIGH (ref 0.1–1.0)
Monocytes Relative: 12 %
Neutro Abs: 5.4 10*3/uL (ref 1.7–7.7)
Neutrophils Relative %: 57 %
Platelets: 213 10*3/uL (ref 150–400)
RBC: 4.65 MIL/uL (ref 4.22–5.81)
RDW: 14.3 % (ref 11.5–15.5)
WBC: 9.6 10*3/uL (ref 4.0–10.5)
nRBC: 0 % (ref 0.0–0.2)

## 2018-09-14 MED ORDER — SODIUM CHLORIDE 0.9 % IV SOLN
1.0000 g | Freq: Once | INTRAVENOUS | Status: AC
  Administered 2018-09-14: 1 g via INTRAVENOUS
  Filled 2018-09-14: qty 10

## 2018-09-14 MED ORDER — SODIUM CHLORIDE 0.9 % IV BOLUS
500.0000 mL | Freq: Once | INTRAVENOUS | Status: AC
  Administered 2018-09-14: 500 mL via INTRAVENOUS

## 2018-09-14 MED ORDER — SODIUM CHLORIDE 0.9 % IV SOLN
INTRAVENOUS | Status: DC | PRN
  Administered 2018-09-14: 250 mL via INTRAVENOUS

## 2018-09-14 MED ORDER — HYDROCODONE-ACETAMINOPHEN 5-325 MG PO TABS
2.0000 | ORAL_TABLET | Freq: Once | ORAL | Status: AC
  Administered 2018-09-14: 2 via ORAL
  Filled 2018-09-14: qty 2

## 2018-09-14 MED ORDER — CEPHALEXIN 500 MG PO CAPS: 500.0000 mg | ORAL_CAPSULE | Freq: Three times a day (TID) | ORAL | 0 refills | Status: AC

## 2018-09-14 MED ORDER — ONDANSETRON HCL 4 MG/2ML IJ SOLN
4.0000 mg | Freq: Once | INTRAMUSCULAR | Status: AC
  Administered 2018-09-14: 4 mg via INTRAVENOUS
  Filled 2018-09-14: qty 2

## 2018-09-14 MED ORDER — CEPHALEXIN 250 MG PO CAPS: 500.0000 mg | ORAL_CAPSULE | Freq: Once | ORAL | Status: DC

## 2018-09-14 NOTE — ED Notes (Signed)
ED Provider at bedside. 

## 2018-09-14 NOTE — ED Notes (Signed)
Patient was able to ambulate with the use of a walker.

## 2018-09-14 NOTE — ED Provider Notes (Signed)
[facility] EMERGENCY DEPARTMENT Provider Note   CSN: [id number] Arrival date & time: 09/14/18  1456     History   Chief Complaint Chief Complaint  Patient presents with  . Fall  . Back Pain    HPI JAECION ALBINI is a 85 y.o. male past 1 history of depression, COPD, diabetes, hyperlipidemia, hypertension who presents for evaluation of lower back pain after mechanical fall that occurred this morning.  Patient states that he has a history of vertigo.  He reports that about 3 days ago, he started noticing some dizziness that felt consistent with his episodes of vertigo.  He states that he had meclizine at home which he took and had improvement in his symptoms.  He states that this morning, he was getting up to go the bathroom and felt vertigo again, causing him to fall and land on his buttock.  He states he did not hit his head or lose consciousness.  He states that since then, he has had pain to his lower back and buttock area.  He took 2 naproxen prior to coming to ED with no improvement in pain.  He reports pain is worsened when trying to move or lift up his legs.  Patient states that he is not currently on blood thinners.  He denies any head injury or LOC.  He denies any vision changes, chest pain, difficulty breathing, abdominal pain, nausea/vomiting, numbness/weakness of his arms or legs.     The history is provided by the patient.    Past Medical History:  Diagnosis Date  . 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 COPD (chronic obstructive pulmonary disease) ([place])   . Depression 03/08/2014  . Diabetes mellitus   . GERD (gastroesophageal reflux disease) 03/08/2014  . History of blood transfusion   . Hyperlipemia   . Hypertension   . OSA (obstructive sleep apnea)   . Osteoarthritis   . Rheumatoid arthritis(714.0)   . Sleep apnea    cpap    Patient Active Problem List   Diagnosis Date Noted  . Chronic respiratory failure ([place]) 11/06/2017  . Hip pain 07/27/2014  .  Abrasion 07/27/2014  . Insomnia 06/27/2014  . Chronic cholecystitis 06/16/2014  . Coccyx pain 03/30/2014  . Diabetes mellitus type II, controlled ([place]) 03/08/2014  . Depression 03/08/2014  . GERD (gastroesophageal reflux disease) 03/08/2014  . Hyperlipidemia 03/08/2014  . Abdominal pain   . Diverticulitis of large intestine without perforation or abscess without bleeding   . Blood poisoning   . Calculus of gallbladder with acute cholecystitis 02/27/2014  . Acute cholecystitis   . Diverticulitis 02/25/2014  . Urinary retention 02/25/2014  . Hypertension 02/25/2014  . SBO (small bowel obstruction) ([place]) 02/25/2014  . Sepsis ([place]) 02/25/2014  . Cholecystitis 02/25/2014  . Slow transit constipation 02/25/2014  . Seborrheic dermatitis of scalp 11/24/2013  . Diarrhea 07/12/2013  . Contusion of multiple sites 05/24/2013  . ED (erectile dysfunction) of organic origin 05/06/2013  . Seborrheic keratosis 05/06/2013  . Cough 02/17/2013  . 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 Acute maxillary sinusitis 10/30/2012  . Allergic rhinitis 06/27/2012  . Hypertrophic and atrophic condition of skin 06/27/2012  . Increased frequency of urination 06/27/2012  . Lower urinary tract infectious disease 06/27/2012  . Major depressive disorder, single episode, unspecified 06/27/2012  . Malaise and fatigue 06/27/2012  . Psychosexual dysfunction with inhibited sexual excitement 06/27/2012  . Personal history of colonic polyps 01/06/2012  . Special screening for malignant neoplasms, colon 11/15/2011  . Pruritus  ani 11/15/2011  . COPD (chronic obstructive pulmonary disease) ([place]) 01/11/2011  . DOE (dyspnea on exertion) 01/01/2011  . Fissure in ano 12/21/2010  . HYPERLIPIDEMIA 01/11/2009  . Obstructive sleep apnea 01/11/2009  . ARTHRITIS, RHEUMATOID 01/11/2009  . OSTEOARTHRITIS 01/11/2009    Past Surgical History:  Procedure Laterality Date  . APPENDECTOMY  1969  . CHOLECYSTECTOMY N/A 06/16/2014    Procedure: LAPAROSCOPIC CHOLECYSTECTOMY;  Surgeon: Greer Pickerel, MD;  Location: [facility];  Service: General;  Laterality: N/A;  . CYSTOSCOPY  01/23/2011   Procedure: CYSTOSCOPY;  Surgeon: Molli Hazard, MD;  Location: [facility];  Service: Urology;  Laterality: N/A;  . FLEXIBLE SIGMOIDOSCOPY  01/07/2012   Procedure: FLEXIBLE SIGMOIDOSCOPY;  Surgeon: Ladene Artist, MD,FACG;  Location: [facility];  Service: Endoscopy;  Laterality: N/A;  . HERNIA REPAIR  1990   right and left inguinal  . NISSEN FUNDOPLICATION    . 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 TONSILLECTOMY  1937  . TRANSURETHRAL RESECTION OF PROSTATE  01/23/2011   Procedure: TRANSURETHRAL RESECTION OF THE PROSTATE WITH GYRUS INSTRUMENTS;  Surgeon: Molli Hazard, MD;  Location: [facility];  Service: Urology;  Laterality: N/A;  . [place] Medications    Prior to Admission medications   Medication Sig Start Date End Date Taking? Authorizing Provider  albuterol (PROVENTIL HFA;VENTOLIN HFA) 108 (90 Base) MCG/ACT inhaler Inhale 2 puffs into the lungs every 6 (six) hours as needed for wheezing or shortness of breath. 07/14/17   Debbrah Alar, NP  aspirin 81 MG tablet Take 1 tablet (81 mg total) by mouth every morning. Stop for 1 week and then resume Patient taking differently: Take 81 mg by mouth every morning.  01/08/12   Barton Dubois, MD  atorvastatin (LIPITOR) 40 MG tablet TAKE 1 TABLET BY MOUTH ONCE DAILY IN THE MORNING 07/27/18   [name], [name], PA-C  benzonatate (TESSALON) 100 MG capsule Take 1 capsule (100 mg total) by mouth 3 (three) times daily as needed for cough. 02/06/18   [name], [name], PA-C  celecoxib (CELEBREX) 200 MG capsule TAKE 1 CAPSULE BY MOUTH ONCE DAILY IN THE EVENING 09/11/18   [name], [name], PA-C  cephALEXin (KEFLEX) 500 MG capsule Take 1 capsule (500 mg total) by mouth 3 (three) times daily for 7 days. 09/14/18 09/21/18  Volanda Napoleon, PA-C  cholecalciferol (VITAMIN D) 1000 UNITS tablet Take 2,000 Units by mouth 2 (two) times daily.     [provider]  clonazePAM (KLONOPIN) 0.25 MG disintegrating tablet 1 tab po q hs prn insomnia or anxiety 09/27/15   [name], [name], PA-C  enalapril (VASOTEC) 2.5 MG tablet Take 2.5 mg by mouth every morning. Reported on 07/12/2015    [provider]  fluticasone (FLOVENT HFA) 110 MCG/ACT inhaler Inhale 2 puffs into the lungs 2 (two) times daily. 04/25/18   [name], [name], PA-C  glucose blood (BAYER CONTOUR TEST) test strip Use as instructed to check blood sugar twice a day.  DX  E11.9 04/25/18   [name], [name], PA-C  Indacaterol Maleate (ARCAPTA NEOHALER) 75 MCG CAPS Place 1 capsule into inhaler and inhale every morning. 01/01/12   Clance, Armando Reichert, MD  ketoconazole (NIZORAL) 2 % cream As  Directed. 05/22/16   [provider]  ketoconazole (NIZORAL) 2 % shampoo As directed. 05/22/16   [provider]  lidocaine (LIDODERM) 5 % Place 1 patch onto the skin daily. Remove & Discard patch  within 12 hours or as directed by MD 06/28/15   [name], [name], PA-C  meclizine (ANTIVERT) 12.5 MG tablet Take 1 tablet (12.5 mg total) by mouth 3 (three) times daily as needed for dizziness. 03/13/17   [name], [name], PA-C  metFORMIN (GLUCOPHAGE) 500 MG tablet TAKE 1 TABLET BY MOUTH THREE TIMES DAILY 08/31/18   [name], [name], PA-C  misoprostol (CYTOTEC) 100 MCG tablet TAKE 1 TABLET BY MOUTH TWICE DAILY IN  THE  MORNING  AND  EVENING  AND  2  TABLETS  AT  BEDTIME 08/13/18   [name], [name], PA-C  Multiple Vitamins-Minerals (CENTRUM) tablet Take 1 tablet by mouth every morning. Reported on 01/05/2015    [provider]  nystatin cream (MYCOSTATIN) Apply 1 application topically 2 (two) times daily. 09/01/18   [name], [name], PA-C  OVER THE COUNTER MEDICATION Place 1-2 drops into both eyes daily as needed (dry red eyes.). Reported on 01/05/2015    [provider]   pioglitazone (ACTOS) 15 MG tablet Take 1 tablet by mouth once daily 04/09/18   [name], [name], PA-C  Tiotropium Bromide-Olodaterol (STIOLTO RESPIMAT) 2.5-2.5 MCG/ACT AERS Inhale 2 puffs into the lungs daily. 01/10/15   Parrett, Fonnie Mu, NP  UNABLE TO FIND CPAP    [provider]  venlafaxine XR (EFFEXOR-XR) 37.5 MG 24 hr capsule TAKE 1 CAPSULE BY MOUTH ONCE DAILY WITH BREAKFAST 06/22/18   [name], [name], PA-C  vitamin C (ASCORBIC ACID) 500 MG tablet Take 500 mg by mouth every morning.     [provider]    Family History Family History  Problem Relation Age of Onset  . Heart disease Mother   . Emphysema Brother   . Cancer Daughter        breast    Social History Social History   Tobacco Use  . Smoking status: Former Smoker    Packs/day: 4.00    Years: 30.00    Pack years: 120.00    Types: Cigarettes    Quit date: 01/21/1978    Years since quitting: 40.6  . Smokeless tobacco: Never Used  Substance Use Topics  . Alcohol use: No  . Drug use: No     Allergies   Patient has no known allergies.   Review of Systems Review of Systems  Eyes: Negative for visual disturbance.  Respiratory: Negative for cough and shortness of breath.   Cardiovascular: Negative for chest pain.  Gastrointestinal: Negative for abdominal pain, nausea and vomiting.  Genitourinary: Negative for dysuria and hematuria.  Musculoskeletal: Positive for back pain.  Neurological: Negative for weakness, numbness and headaches.  All other systems reviewed and are negative.    Physical Exam Updated Vital Signs BP (!) 144/83 (BP Location: Left Arm)   Pulse (!) 53   Temp 98.8 F (37.1 C) (Oral)   Resp 20   SpO2 100%   Physical Exam Vitals signs and nursing note reviewed.  Constitutional:      Appearance: Normal appearance. He is well-developed.  HENT:     Head: Normocephalic and atraumatic.  Eyes:     General: Lids are normal.     Conjunctiva/sclera: Conjunctivae normal.      Pupils: Pupils are equal, round, and reactive to light.     Comments: PERRL. EOMs intact. No nystagmus. No neglect.   Neck:     Musculoskeletal: Full passive range of motion without pain.     Comments: Full flexion/extension and lateral movement of neck fully intact. No bony midline tenderness. No deformities or crepitus.  Cardiovascular:     Rate and Rhythm: Normal rate and regular rhythm.     Pulses: Normal pulses.          Radial pulses are 2+ on the right side and 2+ on the left side.       Dorsalis pedis pulses are 2+ on the right side and 2+ on the left side.     Heart sounds: Normal heart sounds. No murmur. No friction rub. No gallop.   Pulmonary:     Effort: Pulmonary effort is normal.     Breath sounds: Normal breath sounds.     Comments: Lungs clear to auscultation bilaterally.  Symmetric chest rise.  No wheezing, rales, rhonchi. Abdominal:     Palpations: Abdomen is soft. Abdomen is not rigid.     Tenderness: There is no abdominal tenderness. There is no guarding.     Hernia: A hernia is present.     Comments: Abdomen is soft, non-distended, non-tender. No rigidity, No guarding. No peritoneal signs.  Easily reducible hernia noted.  Musculoskeletal: Normal range of motion.     Thoracic back: He exhibits no tenderness.     Lumbar back: He exhibits tenderness.       Back:     Comments: No midline T-spine tenderness.  Tenderness noted to midline L-spine.  No deformity or crepitus noted.  Tenderness extends onto sacrum.  No pelvic instability.  Skin:    General: Skin is warm and dry.     Capillary Refill: Capillary refill takes less than 2 seconds.  Neurological:     Mental Status: He is alert and oriented to person, place, and time.     Comments: Follows commands, Moves all extremities  5/5 strength to BUE and BLE  Sensation intact throughout all major nerve distributions Alert and oriented x3 Answers questions appropriately.  Psychiatric:        Speech: Speech normal.       ED Treatments / Results  Labs (all labs ordered are listed, but only abnormal results are displayed) Labs Reviewed  BASIC METABOLIC PANEL - Abnormal; Notable for the following components:      Result Value   Glucose, Bld 109 (*)    All other components within normal limits  CBC WITH DIFFERENTIAL/PLATELET - Abnormal; Notable for the following components:   Monocytes Absolute 1.1 (*)    All other components within normal limits  URINALYSIS, ROUTINE W REFLEX MICROSCOPIC - Abnormal; Notable for the following components:   APPearance CLOUDY (*)    Glucose, UA 100 (*)    Hgb urine dipstick MODERATE (*)    Nitrite POSITIVE (*)    Leukocytes,Ua SMALL (*)    All other components within normal limits  URINALYSIS, MICROSCOPIC (REFLEX) - Abnormal; Notable for the following components:   Bacteria, UA MANY (*)    All other components within normal limits  URINE CULTURE    EKG None  Radiology Dg Lumbar Spine Complete  Result Date: 09/14/2018 CLINICAL DATA:  Acute lower back pain after fall today. EXAM: LUMBAR SPINE - COMPLETE 4+ VIEW COMPARISON:  None. FINDINGS: No fracture or spondylolisthesis is noted. Moderate degenerative disc disease is noted at L3-4 and L4-5. Mild degenerative disc disease is noted at L1-2 and L2-3 with anterior osteophyte formation. IMPRESSION: Mild-to-moderate multilevel degenerative disc disease. No acute abnormality seen in the lumbar spine. Electronically Signed   By: Marijo Conception M.D.   On: 09/14/2018 16:58   Dg Sacrum/coccyx  Result Date: 09/14/2018 CLINICAL DATA:  Low  back pain after fall today. EXAM: SACRUM AND COCCYX - 2+ VIEW COMPARISON:  None. FINDINGS: There is no evidence of fracture or other focal bone lesions. IMPRESSION: Negative. Electronically Signed   By: Marijo Conception M.D.   On: 09/14/2018 16:59   Ct Head Wo Contrast  Result Date: 09/14/2018 CLINICAL DATA:  Headache, head trauma, confusion EXAM: CT HEAD WITHOUT CONTRAST TECHNIQUE:  Contiguous axial images were obtained from the base of the skull through the vertex without intravenous contrast. COMPARISON:  07/05/2016 FINDINGS: Brain: Advanced brain atrophy and chronic white matter microvascular ischemic changes throughout both cerebral hemispheres. No acute intracranial hemorrhage, mass lesion, new infarction, midline shift, herniation, hydrocephalus, or extra-axial fluid collection. No focal mass effect or edema. Cisterns are patent. Cerebellar atrophy as well. Vascular: Intracranial atherosclerosis.  No hyperdense vessel. Skull: Normal. Negative for fracture or focal lesion. Sinuses/Orbits: No acute finding. Other: None. IMPRESSION: Progressive chronic atrophy and microvascular ischemic changes compared to the prior exam. No acute intracranial abnormality by noncontrast CT. Electronically Signed   By: Jerilynn Mages.  Shick M.D.   On: 09/14/2018 16:30    Procedures Procedures (including critical care time)  Medications Ordered in ED Medications  0.9 %  sodium chloride infusion ( Intravenous Stopped 09/14/18 2003)  cephALEXin (KEFLEX) capsule 500 mg (has no administration in time range)  HYDROcodone-acetaminophen (NORCO/VICODIN) 5-325 MG per tablet 2 tablet (2 tablets Oral Given 09/14/18 1607)  cefTRIAXone (ROCEPHIN) 1 g in sodium chloride 0.9 % 100 mL IVPB ( Intravenous Stopped 09/14/18 1920)  sodium chloride 0.9 % bolus 500 mL ( Intravenous Stopped 09/14/18 1956)  ondansetron (ZOFRAN) injection 4 mg (4 mg Intravenous Given 09/14/18 1846)     Initial Impression / Assessment and Plan / ED Course  I have reviewed the triage vital signs and the nursing notes.  Pertinent labs & imaging results that were available during my care of the patient were reviewed by me and considered in my medical decision making (see chart for details).        84 year old male who presents for evaluation of lower back back pain after fall this morning.  Reports he has a history of vertigo and states that he  felt vertigo symptoms about 3 days ago.  He had been taking meclizine with improvement.  This morning had another episode which caused him to fall and land on his back.  Has been able to ambulate and bear weight since then.  Reports pain to the back and sacrum. Patient is afebrile, non-toxic appearing, sitting comfortably on examination table. Vital signs reviewed and stable.  No neuro deficits noted on exam.  He does have pain and tenderness noted to the lower lumbar and sacral region.  Will plan for imaging.  Additionally, given history of vertigo, will plan for head CT for evaluation of any acute intracranial abnormality.  Patient's other daughter is at bedside now.  She states that she is concerned about some confusion that has been ongoing for last several days.  Patient is alert and oriented x3 and is able to answer questions appropriately but given concerns of daughter's confusion, will plan for labs, urine.  CT head negative for any acute intracranial normality.  There is progressive chronic atrophy and micro-vascular ischemic changes compared to prior exam.  Lumbar x-ray shows mild to moderate multilevel degenerative disc disease.  No acute abnormality.  Sacrum x-ray negative for any acute bony abnormality.  BMP is unremarkable.  CBC without any significant leukocytosis or anemia.  UA shows positive nitrites,  leuko-sites, pyuria.  Urine culture sent.  We will plan to give dose of IV antibiotics.  Reevaluation.  Patient is resting comfortably on bed.  Reports improvement in pain.  He no longer has any dizziness.  I do not suspect that his symptoms are result of acute CVA as it has been waxing and waning.  Suspect either vertigo which he states he has a history of or related to urinary tract infection.  Daughter states he has been more confused over the last several days which could be contributed from UTI.  On my evaluation, he answers all questions appropriately and is alert and oriented x3.  We  will plan to ambulate.  Patient able to ambulate without any dizziness.  Tech reported no signs of any gait abnormalities.  Patient reports feeling no symptoms while ambulating.  At this time, patient is alert and oriented x3, vitals are stable.  I feel that his urinary tract infection be treated on an outpatient basis.  Daughter is agreeable to plan and will stay with him.  We will plan to send home with antibiotics.  Patient with no known drug allergies. At this time, patient exhibits no emergent life-threatening condition that require further evaluation in ED or admission. Discussed patient with Dr. Johnney Killian who is agreeable to plan. Patient had ample opportunity for questions and discussion. All patient's questions were answered with full understanding. Strict return precautions discussed. Patient expresses understanding and agreement to plan.   Portions of this note were generated with [employer]. Dictation errors may occur despite best attempts at proofreading.   Final Clinical Impressions(s) / ED Diagnoses   Final diagnoses:  Acute cystitis with hematuria  Fall, initial encounter  Acute bilateral low back pain, unspecified whether sciatica present    ED Discharge Orders         Ordered    cephALEXin (KEFLEX) 500 MG capsule  3 times daily     09/14/18 2038           Desma Mcgregor 09/14/18 2144    Charlesetta Shanks, MD 09/30/18 [phone]

## 2018-09-14 NOTE — ED Triage Notes (Signed)
Vertigo for a week. This am he fell landing onto his buttocks. Lower back pain. No head injury. His daughter states for the past 6 weeks he has been confused. He is alert and oriented at triage. He is on home oxygen 2l/m [place].

## 2018-09-14 NOTE — Discharge Instructions (Signed)
Take antibiotics as directed. Please take all of your antibiotics until finished.  Make sure you drink plenty of fluids and stay hydrated.  Return emergency department for any fever, confusion, worsening pain, dizziness, vomiting or any other worsening or concerning symptoms.

## 2018-09-14 NOTE — ED Notes (Signed)
Attempted IV to right arm, x 2 , unsuccessful, tol well

## 2018-09-14 NOTE — ED Notes (Signed)
Patient transported to CT 

## 2018-09-15 ENCOUNTER — Ambulatory Visit: Payer: Medicare Other | Admitting: Podiatry

## 2018-09-16 LAB — URINE CULTURE

## 2018-09-17 ENCOUNTER — Telehealth: Payer: Self-pay

## 2018-09-17 NOTE — Telephone Encounter (Signed)
Pt need hospital follow up

## 2018-09-17 NOTE — Telephone Encounter (Signed)
Copied from [place] [phone]. Topic: General - Other >> Sep 17, 2018  9:02 AM Celene Kras A wrote: Reason for CRM: Pts daughter called stating she would like some advice regarding pts recent hospitilization. Pts daughter states he fell, has a uti which is causing him vertigo, and he hurt his back. Please advise.

## 2018-09-18 ENCOUNTER — Ambulatory Visit (INDEPENDENT_AMBULATORY_CARE_PROVIDER_SITE_OTHER): Payer: Medicare Other | Admitting: Medical

## 2018-09-18 ENCOUNTER — Other Ambulatory Visit: Payer: Self-pay

## 2018-09-18 ENCOUNTER — Encounter: Payer: Self-pay | Admitting: Medical

## 2018-09-18 VITALS — BP 110/74 | HR 91 | Temp 96.8°F

## 2018-09-18 DIAGNOSIS — R413 Other amnesia: Secondary | ICD-10-CM | POA: Diagnosis not present

## 2018-09-18 DIAGNOSIS — M545 Low back pain, unspecified: Secondary | ICD-10-CM

## 2018-09-18 DIAGNOSIS — R42 Dizziness and giddiness: Secondary | ICD-10-CM

## 2018-09-18 DIAGNOSIS — N401 Enlarged prostate with lower urinary tract symptoms: Secondary | ICD-10-CM | POA: Diagnosis not present

## 2018-09-18 DIAGNOSIS — R3911 Hesitancy of micturition: Secondary | ICD-10-CM

## 2018-09-18 DIAGNOSIS — E119 Type 2 diabetes mellitus without complications: Secondary | ICD-10-CM

## 2018-09-18 MED ORDER — DICLOFENAC SODIUM 1 % TD GEL: 2.0000 g | Freq: Four times a day (QID) | TRANSDERMAL | 0 refills | Status: DC

## 2018-09-18 NOTE — Progress Notes (Signed)
Subjective:    Patient ID: Lance Powell, male    DOB: December 15, 1929, 85 y.o.   MRN: [id number]  HPI Virtual Visit via Video Note  I connected with Kathlen Mody on 09/18/18 at  1:20 PM EDT by a video enabled telemedicine application and verified that I am speaking with the correct person using two identifiers.  Location: Patient: home Provider: office   I discussed the limitations of evaluation and management by telemedicine and the availability of in person appointments. The patient expressed understanding and agreed to proceed.  History of Present Illness:  Pt had vertigo for 3-5 days before he fell. He states started on past Friday. He was in bed for 2 days and if turned head would get dizzy easy. Now no dizziness. No gross motor or sensory fxn deficits. CT of head was negative for acute injury.  Pt the other day had difficulty urinating and feels like he only empty 1/3 of bladder and had odor to his urine. Culture came back mixed species. He was given cephelexin. Urine looks clearer. No odor. Trying to drink more water. The other day he self cath when had trouble urinating on Monday. Has not had to do that again. Hx of seeing urologist one time a year.Pt has seen urologist and he had studies showing some residual volume. He thinks told his prostate was enlarged.  Some loose stools wed but none since.   Pt sugars in low 91-130. Most of time 110-119 range on his review.  Mid lumbar spine back pain since last week. No radiating pain to legs. No leg weakness. alleve 2 tab twice daily helped along with tylenol. Pain less but still present.   At end of interview family members brought up mild memory issues.      Observations/Objective: General-no acute distress, pleasant, oriented. Lungs- on inspection lungs appear unlabored. Neck- no tracheal deviation or jvd on inspection. Neuro- gross motor function appears intact.  Assessment and Plan: Your dizziness has resolved and  your CT was negative for acute changes. If you get recurrent dizziness or new signs or symptoms let us know. If severe signs and symptoms then ED evaluation.  You had mild urinary symptoms and now better with keflex. If recurrent urinary issues let me know and might rx flomax as by your history considering symptoms may be caused from bph.  Your sugar is well controlled presently. Continue current diabetic treatment and eat low sugar diet.  For low back pain, recommend that you limit use of oral nsaids/dc alleve presently and try voltaren get twice daily. Can also use tylenol otc.  For recent mild memory changes will do mini mental status exam on next visit. Also getting lab work to work as well.  Follow up 2 weeks or as neeed  [name], PA-C  Follow Up Instructions:    I discussed the assessment and treatment plan with the patient. The patient was provided an opportunity to ask questions and all were answered. The patient agreed with the plan and demonstrated an understanding of the instructions.   The patient was advised to call back or seek an in-person evaluation if the symptoms worsen or if the condition fails to improve as anticipated.  I provided  40  minutes of non-face-to-face time during this encounter. 50% of time spent discussing dx, tx plan and answering questions.   [name], PA-C   Review of Systems  Constitutional: Negative for chills, fatigue and fever.  Respiratory: Negative for cough, chest  tightness, shortness of breath and wheezing.   Cardiovascular: Negative for chest pain and palpitations.  Gastrointestinal: Negative for abdominal pain.  Musculoskeletal: Positive for back pain. Negative for neck pain.       Low back pain. Around ED evaluation.  Skin: Negative for rash.  Neurological: Negative for dizziness, speech difficulty, weakness, light-headedness and headaches.  Hematological: Negative for adenopathy. Does not bruise/bleed easily.   Psychiatric/Behavioral: Negative for behavioral problems, confusion, dysphoric mood, sleep disturbance and suicidal ideas.       Objective:   Physical Exam        Assessment & Plan:

## 2018-09-18 NOTE — Patient Instructions (Addendum)
Your dizziness has resolved and your CT was negative for acute changes. If you get recurrent dizziness or new signs or symptoms let us know. If severe signs and symptoms then ED evaluation.  You had mild urinary symptoms and now better with keflex. If recurrent urinary issues let me know and might rx flomax as by your history considering symptoms may be caused from bph.  Your sugar is well controlled presently. Continue current diabetic treatment and eat low sugar diet.  For low back pain, recommend that you limit use of oral nsaids/dc alleve presently and try voltaren get twice daily. Can also use tylenol otc.  For recent mild memory changes will do mini mental status exam on next visit. Also getting lab work to work as well.  Follow up 2 weeks or as neeed

## 2018-09-23 ENCOUNTER — Inpatient Hospital Stay: Payer: Medicare Other | Admitting: Medical

## 2018-09-25 ENCOUNTER — Other Ambulatory Visit: Payer: Self-pay | Admitting: Medical

## 2018-09-25 NOTE — Progress Notes (Addendum)
Virtual Visit via Video Note  I connected with patient on 09/29/18 at  1:00 PM EDT by audio enabled telemedicine application and verified that I am speaking with the correct person using two identifiers.   THIS ENCOUNTER IS A VIRTUAL VISIT DUE TO COVID-19 - PATIENT WAS NOT SEEN IN THE OFFICE. PATIENT HAS CONSENTED TO VIRTUAL VISIT / TELEMEDICINE VISIT   Location of patient: home  Location of provider: office  I discussed the limitations of evaluation and management by telemedicine and the availability of in person appointments. The patient expressed understanding and agreed to proceed.   Subjective:   Lance Powell is a 85 y.o. male who presents for Medicare Annual/Subsequent preventive examination.  Review of Systems:  Home Safety/Smoke Alarms: Feels safe in home. Smoke alarms in place.  Lives alone. Daughters check in on him everyday. 1 story home. Walk in shower. Grab bar. Uses walker.   Male:     PSA- No results found for: PSA    Objective:    Vitals: BP 132/68   Pulse 88   Temp (!) 96.8 F (36 C)   SpO2 92%   There is no height or weight on file to calculate BMI.  Advanced Directives 09/29/2018 09/14/2018 09/26/2017 09/19/2016 05/06/2016 04/19/2016 07/12/2015  Does Patient Have a Medical Advance Directive? Yes Yes Yes Yes Yes Yes Yes  Type of Paramedic of [place];Living will Healthcare Power of [place];Living will [place];Living will - Healthcare Power of [place];Living will  Does patient want to make changes to medical advance directive? No - Patient declined - - No - Patient declined No - Patient declined - -  Copy of [place] in Chart? No - copy requested - Yes No - copy requested - - -  Pre-existing out of facility DNR order (yellow form or pink MOST form) - - - - - - -    Tobacco Social History   Tobacco Use  Smoking Status Former Smoker  .  Packs/day: 4.00  . Years: 30.00  . Pack years: 120.00  . Types: Cigarettes  . Quit date: 01/21/1978  . Years since quitting: 40.7  Smokeless Tobacco Never Used     Counseling given: Not Answered   Clinical Intake:     Pain : No/denies pain   Past Medical History:  Diagnosis Date  . 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 COPD (chronic obstructive pulmonary disease) ([place])   . Depression 03/08/2014  . Diabetes mellitus   . GERD (gastroesophageal reflux disease) 03/08/2014  . History of blood transfusion   . Hyperlipemia   . Hypertension   . OSA (obstructive sleep apnea)   . Osteoarthritis   . Rheumatoid arthritis(714.0)   . Sleep apnea    cpap   Past Surgical History:  Procedure Laterality Date  . APPENDECTOMY  1969  . CHOLECYSTECTOMY N/A 06/16/2014   Procedure: LAPAROSCOPIC CHOLECYSTECTOMY;  Surgeon: Greer Pickerel, MD;  Location: [facility];  Service: General;  Laterality: N/A;  . CYSTOSCOPY  01/23/2011   Procedure: CYSTOSCOPY;  Surgeon: Molli Hazard, MD;  Location: [facility];  Service: Urology;  Laterality: N/A;  . FLEXIBLE SIGMOIDOSCOPY  01/07/2012   Procedure: FLEXIBLE SIGMOIDOSCOPY;  Surgeon: Ladene Artist, MD,FACG;  Location: [facility];  Service: Endoscopy;  Laterality: N/A;  . HERNIA REPAIR  1990   right and left inguinal  . NISSEN FUNDOPLICATION    . 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 TONSILLECTOMY  1937  . TRANSURETHRAL RESECTION OF PROSTATE  01/23/2011   Procedure: TRANSURETHRAL RESECTION OF THE PROSTATE WITH GYRUS INSTRUMENTS;  Surgeon: Molli Hazard, MD;  [facility];  Service: Urology;  Laterality: N/A;  . VASECTOMY  1972   Family History  Problem Relation Age of Onset  . Heart disease Mother   . Emphysema Brother   . Cancer Daughter        breast   Social History   Socioeconomic History  . Marital status: Married    Spouse name: Not on file  . Number of children: 6  . Years of education: Not  on file  . Highest education level: Not on file  Occupational History  . Occupation: Retired    Comment: Glass blower/designer  . Financial resource strain: Not on file  . Food insecurity    Worry: Not on file    Inability: Not on file  . Transportation needs    Medical: Not on file    Non-medical: Not on file  Tobacco Use  . Smoking status: Former Smoker    Packs/day: 4.00    Years: 30.00    Pack years: 120.00    Types: Cigarettes    Quit date: 01/21/1978    Years since quitting: 40.7  . Smokeless tobacco: Never Used  Substance and Sexual Activity  . Alcohol use: No  . Drug use: No  . Sexual activity: Not on file  Lifestyle  . Physical activity    Days per week: Not on file    Minutes per session: Not on file  . Stress: Not on file  Relationships  . Social Herbalist on phone: Not on file    Gets together: Not on file    Attends religious service: Not on file    Active member of club or organization: Not on file    Attends meetings of clubs or organizations: Not on file    Relationship status: Not on file  Other Topics Concern  . Not on file  Social History Narrative  . Not on file    Outpatient Encounter Medications as of 09/29/2018  Medication Sig  . albuterol (PROVENTIL HFA;VENTOLIN HFA) 108 (90 Base) MCG/ACT inhaler Inhale 2 puffs into the lungs every 6 (six) hours as needed for wheezing or shortness of breath.  Marland Kitchen aspirin 81 MG tablet Take 1 tablet (81 mg total) by mouth every morning. Stop for 1 week and then resume (Patient taking differently: Take 81 mg by mouth every morning. )  . atorvastatin (LIPITOR) 40 MG tablet TAKE 1 TABLET BY MOUTH ONCE DAILY IN THE MORNING  . celecoxib (CELEBREX) 200 MG capsule TAKE 1 CAPSULE BY MOUTH ONCE DAILY IN THE EVENING  . cholecalciferol (VITAMIN D) 1000 UNITS tablet Take 2,000 Units by mouth 2 (two) times daily.   . diclofenac sodium (VOLTAREN) 1 % GEL Apply 2 g topically 4 (four) times daily.  . enalapril  (VASOTEC) 2.5 MG tablet Take 2.5 mg by mouth every morning. Reported on 07/12/2015  . fluticasone (FLOVENT HFA) 110 MCG/ACT inhaler Inhale 2 puffs into the lungs 2 (two) times daily.  Marland Kitchen glucose blood (BAYER CONTOUR TEST) test strip Use as instructed to check blood sugar twice a day.  DX  E11.9  . ketoconazole (NIZORAL) 2 % cream As  Directed.  Marland Kitchen ketoconazole (NIZORAL) 2 % shampoo As directed.  . lidocaine (LIDODERM) 5 % Place 1 patch onto the  skin daily. Remove & Discard patch within 12 hours or as directed by MD  . meclizine (ANTIVERT) 12.5 MG tablet Take 1 tablet (12.5 mg total) by mouth 3 (three) times daily as needed for dizziness.  . metFORMIN (GLUCOPHAGE) 500 MG tablet TAKE 1 TABLET BY MOUTH THREE TIMES DAILY  . misoprostol (CYTOTEC) 100 MCG tablet TAKE 1 TABLET BY MOUTH TWICE DAILY IN THE MORNING AND IN THE EVENING AND 2 TABLETS AT BEDTIME  . Multiple Vitamins-Minerals (CENTRUM) tablet Take 1 tablet by mouth every morning. Reported on 01/05/2015  . nystatin cream (MYCOSTATIN) Apply 1 application topically 2 (two) times daily.  Marland Kitchen OVER THE COUNTER MEDICATION Place 1-2 drops into both eyes daily as needed (dry red eyes.). Reported on 01/05/2015  . pioglitazone (ACTOS) 15 MG tablet Take 1 tablet by mouth once daily  . Tiotropium Bromide-Olodaterol (STIOLTO RESPIMAT) 2.5-2.5 MCG/ACT AERS Inhale 2 puffs into the lungs daily.  Marland Kitchen UNABLE TO FIND CPAP  . venlafaxine XR (EFFEXOR-XR) 37.5 MG 24 hr capsule TAKE 1 CAPSULE BY MOUTH ONCE DAILY WITH BREAKFAST  . vitamin C (ASCORBIC ACID) 500 MG tablet Take 500 mg by mouth every morning.   . clonazePAM (KLONOPIN) 0.25 MG disintegrating tablet 1 tab po q hs prn insomnia or anxiety (Patient not taking: Reported on 09/29/2018)  . [DISCONTINUED] benzonatate (TESSALON) 100 MG capsule Take 1 capsule (100 mg total) by mouth 3 (three) times daily as needed for cough.  . [DISCONTINUED] Indacaterol Maleate (ARCAPTA NEOHALER) 75 MCG CAPS Place 1 capsule into inhaler and  inhale every morning.  . [DISCONTINUED] misoprostol (CYTOTEC) 100 MCG tablet TAKE 1 TABLET BY MOUTH TWICE DAILY IN  THE  MORNING  AND  EVENING  AND  2  TABLETS  AT  BEDTIME  . [DISCONTINUED] venlafaxine XR (EFFEXOR-XR) 37.5 MG 24 hr capsule TAKE 1 CAPSULE BY MOUTH ONCE DAILY WITH BREAKFAST   No facility-administered encounter medications on file as of 09/29/2018.     Activities of Daily Living In your present state of health, do you have any difficulty performing the following activities: 09/29/2018  Hearing? Y  Comment has hearing aids but does not wear them  Vision? N  Difficulty concentrating or making decisions? Y  Walking or climbing stairs? N  Dressing or bathing? Y  Doing errands, shopping? Y  Comment does not drive. Daughters take care of it.  Preparing Food and eating ? Y  Comment dtrs bring food or he heats food in microwave.  Using the Toilet? N  In the past six months, have you accidently leaked urine? Y  Do you have problems with loss of bowel control? N  Managing your Medications? N  Managing your Finances? Y  Housekeeping or managing your Housekeeping? Y  Some recent data might be hidden    Patient Care Team: [name], Iris Pert as PCP - General (Physician Assistant) Collene Gobble, MD as Consulting Physician (Pulmonary Disease) [name], MD as Consulting Physician (Ophthalmology)   Assessment:   This is a routine wellness examination for Lance Powell. Physical assessment deferred to PCP.  Exercise Activities and Dietary recommendations Current Exercise Habits: The patient does not participate in regular exercise at present, Exercise limited by: None identified Diet (meal preparation, eat out, water intake, caffeinated beverages, dairy products, fruits and vegetables): well balanced   Goals    . maintain healthy lifestyle.       Fall Risk Fall Risk  09/29/2018 09/26/2017 05/24/2016 06/28/2015 03/27/2015  Falls in the past year? 1 No No No  No  Number falls in past  yr: 0 - - - -  Injury with Fall? 1 - - - -  Follow up Education provided;Falls prevention discussed - - - -    Depression Screen PHQ 2/9 Scores 09/29/2018 09/26/2017 09/19/2016 05/24/2016  PHQ - 2 Score 0 0 0 0  PHQ- 9 Score - - - -  Exception Documentation - - - -    Cognitive Function   MMSE - Mini Mental State Exam 09/26/2017 09/19/2016  Orientation to time 5 5  Orientation to Place 5 5  Registration 3 3  Attention/ Calculation 5 5  Recall 2 3  Language- name 2 objects 2 2  Language- repeat 1 1  Language- follow 3 step command 3 3  Language- read & follow direction 1 1  Write a sentence 1 1  Copy design 1 1  Total score 29 30     6CIT Screen 09/29/2018  What Year? 0 points  What month? 0 points  What time? 0 points  Count back from 20 0 points  Months in reverse 0 points  Repeat phrase 6 points  Total Score 6    Immunization History  Administered Date(s) Administered  . Influenza Split 11/11/2010, 10/22/2011  . Influenza Whole 01/22/2008, 11/22/2009  . Influenza, High Dose Seasonal PF 09/27/2015, 11/04/2016, 10/22/2017  . Influenza, Seasonal, Injecte, Preservative Fre 10/09/2010  . Influenza,inj,Quad PF,6+ Mos 10/19/2012, 11/03/2013, 11/10/2014  . Influenza-Unspecified 11/21/2013  . Pneumococcal Conjugate-13 02/15/2014, 03/27/2015  . Pneumococcal Polysaccharide-23 10/31/2006, 01/22/2007  . Tdap 03/27/2015    Screening Tests Health Maintenance  Topic Date Due  . FOOT EXAM  07/10/2018  . INFLUENZA VACCINE  08/22/2018  . OPHTHALMOLOGY EXAM  12/30/2018  . HEMOGLOBIN A1C  03/10/2019  . TETANUS/TDAP  03/26/2025  . PNA vac Low Risk Adult  Completed       Plan:    Please schedule your next medicare wellness visit with me in 1 yr.  Continue to eat heart healthy diet (full of fruits, vegetables, whole grains, lean protein, water--limit salt, fat, and sugar intake) and increase physical activity as tolerated.  Continue doing brain stimulating activities (puzzles,  reading, adult coloring books, staying active) to keep memory sharp.   Bring a copy of your living will and/or healthcare power of attorney to your next office visit.   I have personally reviewed and noted the following in the patient's chart:   . Medical and social history . Use of alcohol, tobacco or illicit drugs  . Current medications and supplements . Functional ability and status . Nutritional status . Physical activity . Advanced directives . List of other physicians . Hospitalizations, surgeries, and ER visits in previous 12 months . Vitals . Screenings to include cognitive, depression, and falls . Referrals and appointments  In addition, I have reviewed and discussed with patient certain preventive protocols, quality metrics, and best practice recommendations. A written personalized care plan for preventive services as well as general preventive health recommendations were provided to patient.   PCP note: pt's daughter were present on speaker phone during [place]. They state they are concerned about decline in pt's memory.  They have scheduled appt 10/05/18 to come w/ pt to discuss concerns.  Shela Nevin, [place]  09/29/2018   I agree with assessment & Plan of RN  [name], PA-C

## 2018-09-29 ENCOUNTER — Ambulatory Visit (INDEPENDENT_AMBULATORY_CARE_PROVIDER_SITE_OTHER): Payer: Medicare Other | Admitting: *Deleted

## 2018-09-29 ENCOUNTER — Encounter: Payer: Self-pay | Admitting: *Deleted

## 2018-09-29 VITALS — BP 132/68 | HR 88 | Temp 96.8°F

## 2018-09-29 DIAGNOSIS — Z0000 Encounter for general adult medical examination without abnormal findings: Secondary | ICD-10-CM

## 2018-09-29 NOTE — Patient Instructions (Addendum)
Please schedule your next medicare wellness visit with me in 1 yr.  Continue to eat heart healthy diet (full of fruits, vegetables, whole grains, lean protein, water--limit salt, fat, and sugar intake) and increase physical activity as tolerated.  Continue doing brain stimulating activities (puzzles, reading, adult coloring books, staying active) to keep memory sharp.   Bring a copy of your living will and/or healthcare power of attorney to your next office visit.   Lance Powell , Thank you for taking time to come for your Medicare Wellness Visit. I appreciate your ongoing commitment to your health goals. Please review the following plan we discussed and let me know if I can assist you in the future.   These are the goals we discussed: Goals    . DIET - INCREASE WATER INTAKE    . maintain healthy lifestyle.       This is a list of the screening recommended for you and due dates:  Health Maintenance  Topic Date Due  . Complete foot exam   07/10/2018  . Flu Shot  08/22/2018  . Eye exam for diabetics  12/30/2018  . Hemoglobin A1C  03/10/2019  . Tetanus Vaccine  03/26/2025  . Pneumonia vaccines  Completed     Health Maintenance After Age 53 After age 61, you are at a higher risk for certain long-term diseases and infections as well as injuries from falls. Falls are a major cause of broken bones and head injuries in people who are older than age 47. Getting regular preventive care can help to keep you healthy and well. Preventive care includes getting regular testing and making lifestyle changes as recommended by your health care provider. Talk with your health care provider about:  Which screenings and tests you should have. A screening is a test that checks for a disease when you have no symptoms.  A diet and exercise plan that is right for you. What should I know about screenings and tests to prevent falls? Screening and testing are the best ways to find a health problem early.  Early diagnosis and treatment give you the best chance of managing medical conditions that are common after age 7. Certain conditions and lifestyle choices may make you more likely to have a fall. Your health care provider may recommend:  Regular vision checks. Poor vision and conditions such as cataracts can make you more likely to have a fall. If you wear glasses, make sure to get your prescription updated if your vision changes.  Medicine review. Work with your health care provider to regularly review all of the medicines you are taking, including over-the-counter medicines. Ask your health care provider about any side effects that may make you more likely to have a fall. Tell your health care provider if any medicines that you take make you feel dizzy or sleepy.  Osteoporosis screening. Osteoporosis is a condition that causes the bones to get weaker. This can make the bones weak and cause them to break more easily.  Blood pressure screening. Blood pressure changes and medicines to control blood pressure can make you feel dizzy.  Strength and balance checks. Your health care provider may recommend certain tests to check your strength and balance while standing, walking, or changing positions.  Foot health exam. Foot pain and numbness, as well as not wearing proper footwear, can make you more likely to have a fall.  Depression screening. You may be more likely to have a fall if you have a fear of falling,  feel emotionally low, or feel unable to do activities that you used to do.  Alcohol use screening. Using too much alcohol can affect your balance and may make you more likely to have a fall. What actions can I take to lower my risk of falls? General instructions  Talk with your health care provider about your risks for falling. Tell your health care provider if: ? You fall. Be sure to tell your health care provider about all falls, even ones that seem minor. ? You feel dizzy, sleepy, or  off-balance.  Take over-the-counter and prescription medicines only as told by your health care provider. These include any supplements.  Eat a healthy diet and maintain a healthy weight. A healthy diet includes low-fat dairy products, low-fat (lean) meats, and fiber from whole grains, beans, and lots of fruits and vegetables. Home safety  Remove any tripping hazards, such as rugs, cords, and clutter.  Install safety equipment such as grab bars in bathrooms and safety rails on stairs.  Keep rooms and walkways well-lit. Activity   Follow a regular exercise program to stay fit. This will help you maintain your balance. Ask your health care provider what types of exercise are appropriate for you.  If you need a cane or walker, use it as recommended by your health care provider.  Wear supportive shoes that have nonskid soles. Lifestyle  Do not drink alcohol if your health care provider tells you not to drink.  If you drink alcohol, limit how much you have: ? 0-1 drink a day for women. ? 0-2 drinks a day for men.  Be aware of how much alcohol is in your drink. In the [place] one drink equals one typical bottle of beer (12 oz), one-half glass of wine (5 oz), or one shot of hard liquor (1 oz).  Do not use any products that contain nicotine or tobacco, such as cigarettes and e-cigarettes. If you need help quitting, ask your health care provider. Summary  Having a healthy lifestyle and getting preventive care can help to protect your health and wellness after age 35.  Screening and testing are the best way to find a health problem early and help you avoid having a fall. Early diagnosis and treatment give you the best chance for managing medical conditions that are more common for people who are older than age 64.  Falls are a major cause of broken bones and head injuries in people who are older than age 70. Take precautions to prevent a fall at home.  Work with your health care provider  to learn what changes you can make to improve your health and wellness and to prevent falls. This information is not intended to replace advice given to you by your health care provider. Make sure you discuss any questions you have with your health care provider. Document Released: 11/20/2016 Document Revised: 04/30/2018 Document Reviewed: 11/20/2016 Elsevier Patient Education  2020 [employer].

## 2018-10-01 ENCOUNTER — Other Ambulatory Visit: Payer: Self-pay | Admitting: Medical

## 2018-10-02 ENCOUNTER — Telehealth: Payer: Self-pay | Admitting: Medical

## 2018-10-02 NOTE — Telephone Encounter (Signed)
LVM to confirm appt but also verify with pt if ok to schedule earlier on Monday. Also wanted to do screening questions.

## 2018-10-05 ENCOUNTER — Other Ambulatory Visit: Payer: Self-pay

## 2018-10-05 ENCOUNTER — Ambulatory Visit (INDEPENDENT_AMBULATORY_CARE_PROVIDER_SITE_OTHER): Payer: Medicare Other | Admitting: Medical

## 2018-10-05 ENCOUNTER — Encounter: Payer: Self-pay | Admitting: Medical

## 2018-10-05 VITALS — BP 109/77 | HR 113 | Temp 96.2°F | Resp 16 | Ht 72.0 in | Wt 246.0 lb

## 2018-10-05 DIAGNOSIS — Z9189 Other specified personal risk factors, not elsewhere classified: Secondary | ICD-10-CM

## 2018-10-05 DIAGNOSIS — Z87440 Personal history of urinary (tract) infections: Secondary | ICD-10-CM

## 2018-10-05 DIAGNOSIS — B379 Candidiasis, unspecified: Secondary | ICD-10-CM

## 2018-10-05 DIAGNOSIS — Z9181 History of falling: Secondary | ICD-10-CM

## 2018-10-05 DIAGNOSIS — R413 Other amnesia: Secondary | ICD-10-CM | POA: Diagnosis not present

## 2018-10-05 DIAGNOSIS — J449 Chronic obstructive pulmonary disease, unspecified: Secondary | ICD-10-CM

## 2018-10-05 DIAGNOSIS — R42 Dizziness and giddiness: Secondary | ICD-10-CM

## 2018-10-05 DIAGNOSIS — R2689 Other abnormalities of gait and mobility: Secondary | ICD-10-CM

## 2018-10-05 MED ORDER — NYSTATIN 100000 UNIT/GM EX CREA: 1.0000 "application " | TOPICAL_CREAM | Freq: Two times a day (BID) | CUTANEOUS | 3 refills | Status: DC

## 2018-10-05 NOTE — Progress Notes (Signed)
Subjective:    Patient ID: Lance Powell, male    DOB: September 29, 1929, 85 y.o.   MRN: [id number]  HPI  Patient in with daughter today. States he is feeling fine today, no complaints. Daughter reports patient's  Short term memory is gradually getting worse. "He's just not quite as sharp as he usually was."  Patient states "Im at the age where I don't need to remember everything so I just chose the important things."  Patient lives by himself, has local family that comes by daily. He uses a walker and portable O2. Has only been using a walker recently following fall 2 weeks ago (got up during the night with UTI and vertigo). Would like to try physical therapy so he can stop using the walker. May also benefit from home health aid to help with bathing.  Patient states he is not as physically active as usual. Daughter reports he is sleeping a lot more than usual - approximately 15-16 hours per night for the past month or two. Patient states "I just don't have a real reason to get up. I wake up take my meds, eat a meal, use the bathroom and go back to bed." Denies depression.  He uses cpap machine.  Reports occasional incontinence if he waits too long and cannot get to the restroom in time. Daughter reports he has had loose bowels for awhile now (3-4 months).  Not diarrhea.  Had been taking nystatin cream for groin. Would like to reassess today and refill if indicated. Pt want me to recheck. Some improvement per pt but faint rash in left groin area persists.  Urology had mentioned rapiflo to help with starting his stream. Pt uti recently. He came here with empty bladder.  Revisit COPD meds.  Daughter stating he has lost some teeth. Would like mouth examined to be sure it is not the source of any other problems.   Would like flu vaccine today.  States some teeth loose. One dentist in past extracted some teeth. He does not brush his teeth often.    Review of Systems  Constitutional: Negative  for chills, fatigue and fever.  Respiratory: Negative for cough, chest tightness, shortness of breath and wheezing.   Cardiovascular: Negative for chest pain and palpitations.  Musculoskeletal: Negative for back pain, joint swelling, myalgias and neck stiffness.  Skin: Negative for pallor and rash.  Neurological: Negative for dizziness, seizures, speech difficulty, weakness, light-headedness and headaches.  Hematological: Negative for adenopathy. Does not bruise/bleed easily.  Psychiatric/Behavioral: Negative for behavioral problems, decreased concentration and suicidal ideas. The patient is not nervous/anxious.     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   COPD (chronic obstructive pulmonary disease) (HCC)    Depression 03/08/2014   Diabetes mellitus    GERD (gastroesophageal reflux disease) 03/08/2014   History of blood transfusion    Hyperlipemia    Hypertension    OSA (obstructive sleep apnea)    Osteoarthritis    Rheumatoid arthritis(714.0)    Sleep apnea    cpap     Social History   Socioeconomic History   Marital status: Married    Spouse name: Not on file   Number of children: 6   Years of education: Not on file   Highest education level: Not on file  Occupational History   Occupation: Retired    Comment: Advertising copywriter strain: Not on [employer] insecurity  Worry: Not on file    Inability: Not on file   Transportation needs    Medical: Not on file    Non-medical: Not on file  Tobacco Use   Smoking status: Former Smoker    Packs/day: 4.00    Years: 30.00    Pack years: 120.00    Types: Cigarettes    Quit date: 01/21/1978    Years since quitting: 40.7   Smokeless tobacco: Never Used  Substance and Sexual Activity   Alcohol use: No   Drug use: No   Sexual activity: Not on file  Lifestyle   Physical activity    Days per week: Not on file    Minutes per session: Not on  file   Stress: Not on file  Relationships   Social connections    Talks on phone: Not on file    Gets together: Not on file    Attends religious service: Not on file    Active member of club or organization: Not on file    Attends meetings of clubs or organizations: Not on file    Relationship status: Not on file   Intimate partner violence    Fear of current or ex partner: Not on file    Emotionally abused: Not on file    Physically abused: Not on file    Forced sexual activity: Not on file  Other Topics Concern   Not on file  Social History Narrative   Not on file    Past Surgical History:  Procedure Laterality Date   [place] N/A 06/16/2014   Procedure: LAPAROSCOPIC CHOLECYSTECTOMY;  Surgeon: Greer Pickerel, MD;  Location: [facility];  Service: General;  Laterality: N/A;   CYSTOSCOPY  01/23/2011   Procedure: CYSTOSCOPY;  Surgeon: Molli Hazard, MD;  Location: [facility];  Service: Urology;  Laterality: N/A;   FLEXIBLE SIGMOIDOSCOPY  01/07/2012   Procedure: FLEXIBLE SIGMOIDOSCOPY;  Surgeon: Ladene Artist, MD,FACG;  Location: [facility];  Service: Endoscopy;  Laterality: N/A;   HERNIA REPAIR  1990   right and left inguinal   NISSEN FUNDOPLICATION     SIGMOIDECTOMY  2004   colovesical fistula   TONSILLECTOMY  1937   TRANSURETHRAL RESECTION OF PROSTATE  01/23/2011   Procedure: TRANSURETHRAL RESECTION OF THE PROSTATE WITH GYRUS INSTRUMENTS;  Surgeon: Molli Hazard, MD;  Location: [facility];  Service: Urology;  Laterality: N/A;   VASECTOMY  1972    Family History  Problem Relation Age of Onset   Heart disease Mother    Emphysema Brother    Cancer Daughter        breast    No Known Allergies  Current Outpatient Medications on File Prior to Visit  Medication Sig Dispense Refill   albuterol (PROVENTIL HFA;VENTOLIN HFA) 108 (90 Base) MCG/ACT inhaler Inhale 2 puffs into the lungs every  6 (six) hours as needed for wheezing or shortness of breath. 1 Inhaler 2   aspirin 81 MG tablet Take 1 tablet (81 mg total) by mouth every morning. Stop for 1 week and then resume (Patient taking differently: Take 81 mg by mouth every morning. )     atorvastatin (LIPITOR) 40 MG tablet TAKE 1 TABLET BY MOUTH ONCE DAILY IN THE MORNING 90 tablet 0   celecoxib (CELEBREX) 200 MG capsule TAKE 1 CAPSULE BY MOUTH ONCE DAILY IN THE EVENING 30 capsule 0   cholecalciferol (VITAMIN D) 1000 UNITS tablet Take 2,000 Units by mouth 2 (  two) times daily.      clonazePAM (KLONOPIN) 0.25 MG disintegrating tablet 1 tab po q hs prn insomnia or anxiety (Patient not taking: Reported on 09/29/2018) 10 tablet 2   diclofenac sodium (VOLTAREN) 1 % GEL Apply 2 g topically 4 (four) times daily. 50 g 0   enalapril (VASOTEC) 2.5 MG tablet Take 2.5 mg by mouth every morning. Reported on 07/12/2015     fluticasone (FLOVENT HFA) 110 MCG/ACT inhaler Inhale 2 puffs into the lungs 2 (two) times daily. 1 Inhaler 0   glucose blood (BAYER CONTOUR TEST) test strip Use as instructed to check blood sugar twice a day.  DX  E11.9 100 each 5   ketoconazole (NIZORAL) 2 % cream As  Directed.  0   ketoconazole (NIZORAL) 2 % shampoo As directed.  0   lidocaine (LIDODERM) 5 % Place 1 patch onto the skin daily. Remove & Discard patch within 12 hours or as directed by MD 30 patch 0   meclizine (ANTIVERT) 12.5 MG tablet Take 1 tablet (12.5 mg total) by mouth 3 (three) times daily as needed for dizziness. 30 tablet 0   metFORMIN (GLUCOPHAGE) 500 MG tablet TAKE 1 TABLET BY MOUTH THREE TIMES DAILY 90 tablet 0   misoprostol (CYTOTEC) 100 MCG tablet TAKE 1 TABLET BY MOUTH TWICE DAILY IN THE MORNING AND IN THE EVENING AND 2 TABLETS AT BEDTIME 120 tablet 0   Multiple Vitamins-Minerals (CENTRUM) tablet Take 1 tablet by mouth every morning. Reported on 01/05/2015     nystatin cream (MYCOSTATIN) Apply 1 application topically 2 (two) times daily. 30  g 1   OVER THE COUNTER MEDICATION Place 1-2 drops into both eyes daily as needed (dry red eyes.). Reported on 01/05/2015     pioglitazone (ACTOS) 15 MG tablet Take 1 tablet by mouth once daily 90 tablet 0   Tiotropium Bromide-Olodaterol (STIOLTO RESPIMAT) 2.5-2.5 MCG/ACT AERS Inhale 2 puffs into the lungs daily. 4 g 5   UNABLE TO FIND CPAP     venlafaxine XR (EFFEXOR-XR) 37.5 MG 24 hr capsule TAKE 1 CAPSULE BY MOUTH ONCE DAILY WITH BREAKFAST 90 capsule 0   vitamin C (ASCORBIC ACID) 500 MG tablet Take 500 mg by mouth every morning.      No current facility-administered medications on file prior to visit.     BP 109/77    Pulse (!) 113    Temp (!) 96.2 F (35.7 C) (Temporal)    Resp 16    Ht 6' (1.829 m)    Wt 246 lb (111.6 kg)    SpO2 95%    BMI 33.36 kg/m       Objective:   Physical Exam  General Mental Status- Alert. General Appearance- Not in acute distress.   Skin General: Color- Normal Color. Moisture- Normal Moisture.  Neck Carotid Arteries- Normal color. Moisture- Normal Moisture. No carotid bruits. No JVD.  Chest and Lung Exam Auscultation: Breath Sounds:-Normal.  Cardiovascular Auscultation:Rythm- Regular. Murmurs & Other Heart Sounds:Auscultation of the heart reveals- No Murmurs.  Abdomen Inspection:-Inspeection Normal. Palpation/Percussion:Note:No mass. Palpation and Percussion of the abdomen reveal- Non Tender, Non Distended + BS, no rebound or guarding.   Neurologic Cranial Nerve exam:- CN III-XII intact(No nystagmus), symmetric smile. Strength:- 5/5 equal and symmetric strength both upper and lower extremities.  Heent- poor dentition.Marland Kitchen Appears to have wore down teeth and discolore    Assessment & Plan:  [phone] Maurine For  history of memory changes, we did a Mini-Mental status exam today and you scored  30 out of 30.  This does contradict some description of your family members.  We will see how you do and retest Mini-Mental status in 3 to 6  months.  However if functionally memory changing rapidly then just go ahead and notify me and I will make referral to neurologist for more in-depth testing.  For COPD, placed in referral to your former pulmonologist.  We will get his opinion on the number of hours after sleeping at night and whether or not you need to be back on a different inhaler.  Continue oxygen and CPAP.  For mild loose stools to have some form but not diarrhea, I think is a good idea for you to try for you to try Metamucil over-the-counter and see if stools can be bulked up.  I am hesitant to give anything too strong that might constipation.  I do want you to go ahead and establish care with a dentist and touch base on recommended plan of dentist that you saw.  Concerned that you might occasionally get small dental abscess and might need occasional tooth extraction.  Will refer for home health evaluation.  Also due to history of fall will have PT evaluation.  Do recommend he continue use a walker to avoid falls.  Also make sure that your house is well lit with nightlights.  I will see what pulmonologist thinks of your access sleeping.  Might consider increasing Effexor in the event that you might be depressed.  For history of fungal infection with minimal irritation of the left groin region, I did go ahead and refill your nystatin cream and advised that you restart.  This might recur intermittently and also remind you to dry the area well after showering.  History of UTI will get a urine culture.  Nursing staff left before the end of your visit so recommend she get flu vaccine through the pharmacy.  Follow-up in 1 month or as needed.  40 + minutes spent with pt. 50% of time spent counseling pt on dx, plan and answering question of pt and family.

## 2018-10-05 NOTE — Patient Instructions (Addendum)
For  history of memory changes, we did a Mini-Mental status exam today and you scored 30 out of 30.  This does contradict some description of your family members.  We will see how you do and retest Mini-Mental status in 3 to 6 months.  However if functionally memory changing rapidly then just go ahead and notify me and I will make referral to neurologist for more in-depth testing.  For COPD, placed in referral to your former pulmonologist.  We will get his opinion on the number of hours after sleeping at night and whether or not you need to be back on a different inhaler.  Continue oxygen and CPAP.  For mild loose stools to have some form but not diarrhea, I think is a good idea for you to try for you to try Metamucil over-the-counter and see if stools can be bulked up.  I am hesitant to give anything too strong that might constipation.  I do want you to go ahead and establish care with a dentist and touch base on recommended plan of dentist that you saw.  Concerned that you might occasionally get small dental abscess and might need occasional tooth extraction.  Will refer for home health evaluation.  Also due to history of fall will have PT evaluation.  Do recommend he continue use a walker to avoid falls.  Also make sure that your house is well lit with nightlights.  I will see what pulmonologist thinks of your access sleeping.  Might consider increasing Effexor in the event that you might be depressed.  For history of fungal infection with minimal irritation of the left groin region, I did go ahead and refill your nystatin cream and advised that you restart.  This might recur intermittently and also remind you to dry the area well after showering.  History of UTI will get a urine culture.  Nursing staff left before the end of your visit so recommend she get flu vaccine through the pharmacy.  Follow-up in 1 month or as needed.

## 2018-10-09 DIAGNOSIS — Z9981 Dependence on supplemental oxygen: Secondary | ICD-10-CM | POA: Diagnosis not present

## 2018-10-09 DIAGNOSIS — M069 Rheumatoid arthritis, unspecified: Secondary | ICD-10-CM | POA: Diagnosis not present

## 2018-10-09 DIAGNOSIS — Z7984 Long term (current) use of oral hypoglycemic drugs: Secondary | ICD-10-CM | POA: Diagnosis not present

## 2018-10-09 DIAGNOSIS — G4733 Obstructive sleep apnea (adult) (pediatric): Secondary | ICD-10-CM | POA: Diagnosis not present

## 2018-10-09 DIAGNOSIS — E785 Hyperlipidemia, unspecified: Secondary | ICD-10-CM | POA: Diagnosis not present

## 2018-10-09 DIAGNOSIS — B369 Superficial mycosis, unspecified: Secondary | ICD-10-CM | POA: Diagnosis not present

## 2018-10-09 DIAGNOSIS — N39498 Other specified urinary incontinence: Secondary | ICD-10-CM | POA: Diagnosis not present

## 2018-10-09 DIAGNOSIS — J449 Chronic obstructive pulmonary disease, unspecified: Secondary | ICD-10-CM | POA: Diagnosis not present

## 2018-10-09 DIAGNOSIS — I10 Essential (primary) hypertension: Secondary | ICD-10-CM | POA: Diagnosis not present

## 2018-10-09 DIAGNOSIS — F329 Major depressive disorder, single episode, unspecified: Secondary | ICD-10-CM | POA: Diagnosis not present

## 2018-10-09 DIAGNOSIS — N312 Flaccid neuropathic bladder, not elsewhere classified: Secondary | ICD-10-CM | POA: Diagnosis not present

## 2018-10-09 DIAGNOSIS — Z9989 Dependence on other enabling machines and devices: Secondary | ICD-10-CM | POA: Diagnosis not present

## 2018-10-09 DIAGNOSIS — K219 Gastro-esophageal reflux disease without esophagitis: Secondary | ICD-10-CM | POA: Diagnosis not present

## 2018-10-09 DIAGNOSIS — M1990 Unspecified osteoarthritis, unspecified site: Secondary | ICD-10-CM | POA: Diagnosis not present

## 2018-10-09 DIAGNOSIS — E119 Type 2 diabetes mellitus without complications: Secondary | ICD-10-CM | POA: Diagnosis not present

## 2018-10-09 DIAGNOSIS — Z9181 History of falling: Secondary | ICD-10-CM | POA: Diagnosis not present

## 2018-10-14 ENCOUNTER — Other Ambulatory Visit: Payer: Self-pay | Admitting: Medical

## 2018-10-16 ENCOUNTER — Telehealth: Payer: Self-pay

## 2018-10-16 ENCOUNTER — Ambulatory Visit: Payer: Medicare Other | Admitting: Emergency Medicine

## 2018-10-16 ENCOUNTER — Encounter: Payer: Self-pay | Admitting: Emergency Medicine

## 2018-10-16 ENCOUNTER — Other Ambulatory Visit: Payer: Self-pay

## 2018-10-16 VITALS — BP 130/74 | HR 74 | Ht 72.0 in | Wt 250.0 lb

## 2018-10-16 DIAGNOSIS — J438 Other emphysema: Secondary | ICD-10-CM

## 2018-10-16 DIAGNOSIS — J9611 Chronic respiratory failure with hypoxia: Secondary | ICD-10-CM | POA: Diagnosis not present

## 2018-10-16 DIAGNOSIS — G4733 Obstructive sleep apnea (adult) (pediatric): Secondary | ICD-10-CM | POA: Diagnosis not present

## 2018-10-16 DIAGNOSIS — Z23 Encounter for immunization: Secondary | ICD-10-CM

## 2018-10-16 MED ORDER — STIOLTO RESPIMAT 2.5-2.5 MCG/ACT IN AERS: 2.0000 | INHALATION_SPRAY | Freq: Every day | RESPIRATORY_TRACT | 0 refills | Status: DC

## 2018-10-16 MED ORDER — STIOLTO RESPIMAT 2.5-2.5 MCG/ACT IN AERS: 2.0000 | INHALATION_SPRAY | Freq: Every day | RESPIRATORY_TRACT | 6 refills | Status: DC

## 2018-10-16 NOTE — Assessment & Plan Note (Signed)
We checked your oxygen levels today.  You can be on room air (off oxygen) when you are sitting quietly, not exerting yourself.  When you are walking around the house or doing normal exertion you need to be on 2 L/min.  Would recommend that you wear this in the shower as well.  Our goal oxygen saturation is greater than 88%.  You may find that you need to turn oxygen up higher if you are doing heavy exertion such as your physical therapy.

## 2018-10-16 NOTE — Assessment & Plan Note (Signed)
Continue to wear your CPAP every night.  We will [facility] an order to [facility] to ensure that you are getting the appropriate equipment.  In particular you need to ensure that you are able to bleed your oxygen into your CPAP circuit at 2 L/min.

## 2018-10-16 NOTE — Assessment & Plan Note (Signed)
We will start Stiolto 2 puffs once daily.  Take this medication every day as a maintenance medicine. Keep your albuterol (ProAir -red inhaler) available to use 2 puffs up to every 4 hours if needed for shortness of breath, chest tightness, wheezing. Flu shot today Pneumonia shot is up-to-date Follow with Dr Lamonte Sakai in 3 months or sooner if you have any problems.

## 2018-10-16 NOTE — Telephone Encounter (Signed)
Copied from [place] [phone]. Topic: General - Other >> Oct 16, 2018 10:14 AM Rainey Pines A wrote: Patients daughter called to get the results from patients urinalysis done this mont a couple weeks ago. I advised her that I didn't see it in the system. She stated that the specimen was done towards the end of the day. She is requesting a callback  No records of urine collection on 10-05-2018, Per daughter patient is stable at this time. She will call for a follow up appointment. She reports he is stable at this time.

## 2018-10-16 NOTE — Progress Notes (Signed)
Subjective:    Patient ID: Lance Powell, male    DOB: 01-23-1929, 85 y.o.   MRN: [id number]  HPI  ROV 10/16/2018 --[name] returns for follow-up.  He is 89 and has a history of significant former tobacco (120 pack years), COPD, obstructive sleep apnea on CPAP with oxygen bled into his system.  He has hypoxemic respiratory failure and uses 2 L/min with exertion.  Based on our discussions last year he has not been on any scheduled bronchodilator therapy.  We hope to get him a portable oxygen concentrator last year but there was difficulty obtaining because he also needs home concentrator to bleed oxygen into his CPAP. Today he reports that his exertional tolerance is good, but his family notes that this doesn't seem true. Every evening he produces mucous, gray. He has had some dizziness. He has been working with PT, and has noticed some desats associated with this.    No flowsheet data found.  Review of Systems As per HPI  Past Medical History:  Diagnosis Date  . 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 COPD (chronic obstructive pulmonary disease) ([place])   . Depression 03/08/2014  . Diabetes mellitus   . GERD (gastroesophageal reflux disease) 03/08/2014  . History of blood transfusion   . Hyperlipemia   . Hypertension   . OSA (obstructive sleep apnea)   . Osteoarthritis   . Rheumatoid arthritis(714.0)   . Sleep apnea    cpap     Family History  Problem Relation Age of Onset  . Heart disease Mother   . Emphysema Brother   . Cancer Daughter        breast     Social History   Socioeconomic History  . Marital status: Married    Spouse name: Not on file  . Number of children: 6  . Years of education: Not on file  . Highest education level: Not on file  Occupational History  . Occupation: Retired    Comment: Glass blower/designer  . Financial resource strain: Not on file  . Food insecurity    Worry: Not on file    Inability: Not on file  . Transportation needs   Medical: Not on file    Non-medical: Not on file  Tobacco Use  . Smoking status: Former Smoker    Packs/day: 4.00    Years: 30.00    Pack years: 120.00    Types: Cigarettes    Quit date: 01/21/1978    Years since quitting: 40.7  . Smokeless tobacco: Never Used  Substance and Sexual Activity  . Alcohol use: No  . Drug use: No  . Sexual activity: Not on file  Lifestyle  . Physical activity    Days per week: Not on file    Minutes per session: Not on file  . Stress: Not on file  Relationships  . Social Herbalist on phone: Not on file    Gets together: Not on file    Attends religious service: Not on file    Active member of club or organization: Not on file    Attends meetings of clubs or organizations: Not on file    Relationship status: Not on file  . Intimate partner violence    Fear of current or ex partner: Not on file    Emotionally abused: Not on file    Physically abused: Not on file    Forced sexual activity: Not on file  Other Topics Concern  .  Not on file  Social History Narrative  . 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 albuterol (PROVENTIL HFA;VENTOLIN HFA) 108 (90 Base) MCG/ACT inhaler Inhale 2 puffs into the lungs every 6 (six) hours as needed for wheezing or shortness of breath. 1 Inhaler 2  . aspirin 81 MG tablet Take 1 tablet (81 mg total) by mouth every morning. Stop for 1 week and then resume (Patient taking differently: Take 81 mg by mouth every morning. )    . atorvastatin (LIPITOR) 40 MG tablet TAKE 1 TABLET BY MOUTH ONCE DAILY IN THE MORNING 90 tablet 0  . celecoxib (CELEBREX) 200 MG capsule TAKE 1 CAPSULE BY MOUTH ONCE DAILY IN THE EVENING 30 capsule 0  . cholecalciferol (VITAMIN D) 1000 UNITS tablet Take 2,000 Units by mouth 2 (two) times daily.     . diclofenac  sodium (VOLTAREN) 1 % GEL Apply 2 g topically 4 (four) times daily. 50 g 0  . enalapril (VASOTEC) 2.5 MG tablet Take 2.5 mg by mouth every morning. Reported on 07/12/2015    . glucose blood (BAYER CONTOUR TEST) test strip Use as instructed to check blood sugar twice a day.  DX  E11.9 100 each 5  . Indacaterol Maleate (ARCAPTA NEOHALER) 75 MCG CAPS Place into inhaler and inhale.    . meclizine (ANTIVERT) 12.5 MG tablet Take 1 tablet (12.5 mg total) by mouth 3 (three) times daily as needed for dizziness. 30 tablet 0  . metFORMIN (GLUCOPHAGE) 500 MG tablet TAKE 1 TABLET BY MOUTH THREE TIMES DAILY 90 tablet 0  . misoprostol (CYTOTEC) 100 MCG tablet TAKE 1 TABLET BY MOUTH TWICE DAILY IN THE MORNING AND IN THE EVENING AND 2 TABLETS AT BEDTIME 120 tablet 0  . Multiple Vitamins-Minerals (CENTRUM) tablet Take 1 tablet by mouth every morning. Reported on 01/05/2015    . OVER THE COUNTER MEDICATION Place 1-2 drops into both eyes daily as needed (dry red eyes.). Reported on 01/05/2015    . pioglitazone (ACTOS) 15 MG tablet Take 1 tablet by mouth once daily 90 tablet 0  . UNABLE TO FIND CPAP    . venlafaxine XR (EFFEXOR-XR) 37.5 MG 24 hr capsule TAKE 1 CAPSULE BY MOUTH ONCE DAILY WITH BREAKFAST 90 capsule 0  . vitamin C (ASCORBIC ACID) 500 MG tablet Take 500 mg by mouth every morning.     . fluticasone (FLOVENT HFA) 110 MCG/ACT inhaler Inhale 2 puffs into the lungs 2 (two) times daily. 1 Inhaler 0  . ketoconazole (NIZORAL) 2 % cream As  Directed.  0  . ketoconazole (NIZORAL) 2 % shampoo As directed.  0  . lidocaine (LIDODERM) 5 % Place 1 patch onto the skin daily. Remove & Discard patch within 12 hours or as directed by MD 30 patch 0  . nystatin cream (MYCOSTATIN) Apply 1 application topically 2 (two) times daily. 30 g 3  . clonazePAM (KLONOPIN) 0.25 MG disintegrating tablet 1 tab po q hs prn insomnia or anxiety (Patient not taking: Reported on 09/29/2018) 10 tablet 2  . Tiotropium Bromide-Olodaterol (STIOLTO  RESPIMAT) 2.5-2.5 MCG/ACT AERS Inhale 2 puffs into the lungs daily. (Patient not taking: Reported on 10/16/2018) 4 g 5   No facility-administered medications prior to visit.          Objective:   Physical Exam Vitals:  10/16/18 1645  BP: 130/74  Pulse: 74  SpO2: 97%  Weight: 250 lb (113.4 kg)  Height: 6' (1.829 m)   Gen: Pleasant, overwt man, in no distress,  normal affect  ENT: No lesions,  mouth clear,  oropharynx clear, no postnasal drip  Neck: No JVD, no stridor  Lungs: No use of accessory muscles, clear on a normal breath, he does have wheeze on a forced expiration.   Cardiovascular: RRR, heart sounds normal, no murmur or gallops, no peripheral edema  Musculoskeletal: No deformities, no cyanosis or clubbing  Neuro: alert, awake, a bit tangential, poor memory.   Skin: Warm, no lesions or rashes      Assessment & Plan:  COPD (chronic obstructive pulmonary disease) (HCC) We will start Stiolto 2 puffs once daily.  Take this medication every day as a maintenance medicine. Keep your albuterol (ProAir -red inhaler) available to use 2 puffs up to every 4 hours if needed for shortness of breath, chest tightness, wheezing. Flu shot today Pneumonia shot is up-to-date Follow with Dr Lamonte Sakai in 3 months or sooner if you have any problems.  Chronic respiratory failure (HCC) We checked your oxygen levels today.  You can be on room air (off oxygen) when you are sitting quietly, not exerting yourself.  When you are walking around the house or doing normal exertion you need to be on 2 L/min.  Would recommend that you wear this in the shower as well.  Our goal oxygen saturation is greater than 88%.  You may find that you need to turn oxygen up higher if you are doing heavy exertion such as your physical therapy.   Obstructive sleep apnea Continue to wear your CPAP every night.  We will send an order to [facility] to ensure that you are getting the appropriate equipment.  In  particular you need to ensure that you are able to bleed your oxygen into your CPAP circuit at 2 L/min.  [name], MD, PhD 10/16/2018, 5:25 PM [facility] [phone] or if no answer [phone]

## 2018-10-16 NOTE — Patient Instructions (Addendum)
We will start Stiolto 2 puffs once daily.  Take this medication every day as a maintenance medicine. Keep your albuterol (ProAir -red inhaler) available to use 2 puffs up to every 4 hours if needed for shortness of breath, chest tightness, wheezing. We checked your oxygen levels today.  You can be on room air (off oxygen) when you are sitting quietly, not exerting yourself.  When you are walking around the house or doing normal exertion you need to be on 2 L/min.  Would recommend that you wear this in the shower as well.  Our goal oxygen saturation is greater than 88%.  You may find that you need to turn oxygen up higher if you are doing heavy exertion such as your physical therapy. Continue to wear your CPAP every night.  We will send an order to [facility] to ensure that you are getting the appropriate equipment.  In particular you need to ensure that you are able to bleed your oxygen into your CPAP circuit at 2 L/min. Flu shot today Pneumonia shot is up-to-date Follow with Dr Lamonte Sakai in 3 months or sooner if you have any problems.

## 2018-10-17 ENCOUNTER — Telehealth: Payer: Self-pay | Admitting: Medical

## 2018-10-17 NOTE — Telephone Encounter (Signed)
2 weeks ago or so pt gave urine after lab was closed and everyone left. I was not aware urine had to be refrigerated. It was past 5. Turned it in next morning and was advised. I thought I  recall asked staff member who told me this to call pt and advise to give another sample and apologize for me. But can't recall exact details and did not see note in epic.   So if you could call pt and apologize for me.  If they can come by and give sample. Help them get scheduled to just give sample. Please coordinate with lab so it is convenient for pt to drop of sample.

## 2018-10-19 ENCOUNTER — Telehealth: Payer: Self-pay | Admitting: Medical

## 2018-10-19 NOTE — Telephone Encounter (Signed)
Information given to patient, he will tell his daughter. He is doing well at this time but will have daughter call to arrange bringing urine specimen in.

## 2018-10-19 NOTE — Telephone Encounter (Signed)
I don't understand the request.  Is this for PT, OT, nursing service.  Not sure of frequency 1x1, 2X3?  Would you call them and ask for clarification. Let me know what they say?

## 2018-10-19 NOTE — Telephone Encounter (Signed)
Jim calling from [facility] would like verbals for pt   Frequency:  1x1 2x3    Details:   bp sitting 130/80 Standing 120/60   With interment dizziness Oxygen at rest with 2lt per minute  96% After moderate exertion dropped to 79%   Pt does not have all raspatory medications in the home

## 2018-10-20 ENCOUNTER — Telehealth: Payer: Self-pay | Admitting: Emergency Medicine

## 2018-10-20 DIAGNOSIS — G4734 Idiopathic sleep related nonobstructive alveolar hypoventilation: Secondary | ICD-10-CM

## 2018-10-20 DIAGNOSIS — G4733 Obstructive sleep apnea (adult) (pediatric): Secondary | ICD-10-CM

## 2018-10-20 MED ORDER — ALBUTEROL SULFATE HFA 108 (90 BASE) MCG/ACT IN AERS: 2.0000 | INHALATION_SPRAY | Freq: Four times a day (QID) | RESPIRATORY_TRACT | 2 refills | Status: DC | PRN

## 2018-10-20 NOTE — Telephone Encounter (Signed)
Spoke with Manuela Schwartz and advised her that I would send in the albuterol to the [facility]. Nothing further is needed.

## 2018-10-20 NOTE — Addendum Note (Signed)
Addended by: Jannette Spanner on: 10/20/2018 05:15 PM   Modules accepted: Orders

## 2018-10-20 NOTE — Telephone Encounter (Signed)
Clair Gulling is calling checking on the verbal order request

## 2018-10-20 NOTE — Telephone Encounter (Signed)
Order was sent and pt notified. Nothing further is needed.

## 2018-10-20 NOTE — Telephone Encounter (Signed)
Call returned to patient, confirmed DOB, he reports he saw RB recently and he reports he was going to call him in a new machine. He reports he and his children searched his home and they found his cpap machine. He states he just needs the oxygen bled into his cpap. I made the patient aware I would do some resaerch and get back with him. Made aware due to time he probably would not get a call back until tomorrow. Voiced understanding.   Per AVS from RB send order to [facility] to be sure patient has correct equipment.   Will route message to app of day for signature as RB does not return until 10/05.   Call made to [facility], spoke with cpap department. They reports they have notes from last march stating they were not able to process the order for the cpap machine due to not being able to contact the patient. Last air view compliance was 12/2017. She reports he has a SD according to air view however she does not see where they sent him a a cpap machine.   Call made to patient, he reports he got the machine over a year ago but he misplaced it. He states that [facility] is the name that is on the machine.   TP please advise if willing to sign order for cpap machine? If so please advise of cpap settings. Or would you like [place] to make patient aware he may have to wait for RB's return. Sleep done 2011. Patient is not in [place], has a SD card. Last OV 2019. Thanks.

## 2018-10-20 NOTE — Telephone Encounter (Signed)
I am fine signing- O2 to bleed into CPAP since he found his CPAP ?

## 2018-10-21 ENCOUNTER — Other Ambulatory Visit: Payer: Self-pay | Admitting: Medical

## 2018-10-21 ENCOUNTER — Other Ambulatory Visit: Payer: Self-pay

## 2018-10-21 DIAGNOSIS — Z87440 Personal history of urinary (tract) infections: Secondary | ICD-10-CM

## 2018-10-21 DIAGNOSIS — R413 Other amnesia: Secondary | ICD-10-CM

## 2018-10-21 NOTE — Telephone Encounter (Signed)
Left Lance Powell a message to call back.

## 2018-10-22 ENCOUNTER — Other Ambulatory Visit: Payer: Self-pay

## 2018-10-22 ENCOUNTER — Other Ambulatory Visit (INDEPENDENT_AMBULATORY_CARE_PROVIDER_SITE_OTHER): Payer: Medicare Other

## 2018-10-22 DIAGNOSIS — R413 Other amnesia: Secondary | ICD-10-CM | POA: Diagnosis not present

## 2018-10-22 DIAGNOSIS — Z87440 Personal history of urinary (tract) infections: Secondary | ICD-10-CM

## 2018-10-22 LAB — POC URINALSYSI DIPSTICK (AUTOMATED)
Bilirubin, UA: NEGATIVE
Blood, UA: NEGATIVE
Glucose, UA: NEGATIVE
Ketones, UA: NEGATIVE
Leukocytes, UA: NEGATIVE
Nitrite, UA: NEGATIVE
Protein, UA: NEGATIVE
Spec Grav, UA: 1.025 (ref 1.010–1.025)
Urobilinogen, UA: 0.2 E.U./dL
pH, UA: 5.5 (ref 5.0–8.0)

## 2018-10-23 NOTE — Telephone Encounter (Signed)
Lance Powell from [facility] wanting verbal orders    Home heath  PT    1 week 1  2 week 3   Functional mobility     Please Advise

## 2018-10-23 NOTE — Telephone Encounter (Signed)
Ok. But when you call over there let them know I will be signing order. Inform them of new legislation in [place] that allow NP, PA to sign home health orders, PT etc. No co sign needed. Have them look into that as I have been signing over orders and they keep sending the orders back. Let me know what they say. I can sign sign the order but co signature no longer needed per federal and states rules.   Ask them to verify this with there own research.

## 2018-10-24 LAB — URINE CULTURE
MICRO NUMBER:: 944026
SPECIMEN QUALITY:: ADEQUATE

## 2018-10-26 NOTE — Telephone Encounter (Signed)
Verbal order given. Jim notified [name] will be signing orders.

## 2018-10-28 ENCOUNTER — Other Ambulatory Visit: Payer: Self-pay | Admitting: Medical

## 2018-10-28 DIAGNOSIS — G4733 Obstructive sleep apnea (adult) (pediatric): Secondary | ICD-10-CM | POA: Diagnosis not present

## 2018-10-28 DIAGNOSIS — J9610 Chronic respiratory failure, unspecified whether with hypoxia or hypercapnia: Secondary | ICD-10-CM | POA: Diagnosis not present

## 2018-10-28 DIAGNOSIS — J449 Chronic obstructive pulmonary disease, unspecified: Secondary | ICD-10-CM | POA: Diagnosis not present

## 2018-10-28 DIAGNOSIS — R0609 Other forms of dyspnea: Secondary | ICD-10-CM | POA: Diagnosis not present

## 2018-10-30 ENCOUNTER — Telehealth: Payer: Self-pay

## 2018-10-30 NOTE — Telephone Encounter (Signed)
Copied from [place] [id number]. Topic: General - Other >> Oct 30, 2018 10:04 AM Jodie Echevaria wrote: Reason for CRM: Patient daughter Daryll Drown called to say she have been waiting for a call about her fathers urinalysis done a few weeks ago. Asking for a call back at Ph# [phone]

## 2018-11-02 NOTE — Telephone Encounter (Signed)
Pt's daughter notified.

## 2018-11-05 ENCOUNTER — Telehealth: Payer: Self-pay | Admitting: Medical

## 2018-11-05 NOTE — Telephone Encounter (Signed)
For this incident best to have in person visit. Discuss circumstance of event more in detail. Also based on this not sure about ordering PT as sometimes pt get winded as if exercising doing PT. Pt may need labs other studies. Can you get him scheduled for tomorrow or Friday.

## 2018-11-05 NOTE — Telephone Encounter (Signed)
Caller name: [id number]   Relation to pt: PT from [facility] Call back number: Pharmacy: [phone]  Reason for call:  Requesting verbal orders for PT home health orders  1x 4.  PT would like to report BP status of intermittent  dizzinenes upon standing 130/80 and when standing up 130/80 . Affter 20 minutes of exercise patient had sudden BP drop with symptoms of paleness clammy skin, shortness of breath which cleared after 1 minute sitting BP low 90/60

## 2018-11-06 NOTE — Telephone Encounter (Signed)
Pt could not come in today. Pt scheduled for Monday

## 2018-11-09 ENCOUNTER — Encounter: Payer: Self-pay | Admitting: Medical

## 2018-11-09 ENCOUNTER — Ambulatory Visit (HOSPITAL_BASED_OUTPATIENT_CLINIC_OR_DEPARTMENT_OTHER)
Admission: RE | Admit: 2018-11-09 | Discharge: 2018-11-09 | Disposition: A | Discharge status: Home / Self Care | Payer: Medicare Other | Source: Ambulatory Visit | Attending: Medical | Admitting: Medical

## 2018-11-09 ENCOUNTER — Other Ambulatory Visit: Payer: Self-pay

## 2018-11-09 ENCOUNTER — Ambulatory Visit (INDEPENDENT_AMBULATORY_CARE_PROVIDER_SITE_OTHER): Payer: Medicare Other | Admitting: Medical

## 2018-11-09 VITALS — BP 119/65 | HR 105 | Temp 96.4°F | Resp 16 | Ht 72.0 in | Wt 251.4 lb

## 2018-11-09 DIAGNOSIS — M5410 Radiculopathy, site unspecified: Secondary | ICD-10-CM

## 2018-11-09 DIAGNOSIS — M542 Cervicalgia: Secondary | ICD-10-CM

## 2018-11-09 DIAGNOSIS — R42 Dizziness and giddiness: Secondary | ICD-10-CM | POA: Diagnosis not present

## 2018-11-09 DIAGNOSIS — Z9181 History of falling: Secondary | ICD-10-CM | POA: Diagnosis not present

## 2018-11-09 NOTE — Telephone Encounter (Signed)
Lance Powell called in checking on status of order. Please advise.

## 2018-11-09 NOTE — Progress Notes (Signed)
Subjective:    Patient ID: Lance Powell, male    DOB: 10-12-1929, 85 y.o.   MRN: [id number]  HPI  Pt in for states recently when he was doing physical therapy he was winded and light headed. He was doing a 6 minute walk. His bp was low at that time. It was 90/60 at that time. But this Saturday was 116/60.  He was using o2 when he had test. He did make it to 2 minutes.  Pt felt like he was about to pass out/light headed. No episodes of feeling like might pass out.  Reason for PT was to help with gait.  Pt was being evaluated by PT for ongoing pt about 1 time a week for 3.    Also he has some neck pain recently for about 2 weeks with pain recently down toward his rt shoulder and some pain radiating down his rt arm to his elbow.   Pt has copd and diabetes.    Review of Systems  Constitutional: Negative for chills, fatigue and fever.  Respiratory: Negative for chest tightness, shortness of breath and wheezing.   Cardiovascular: Negative for chest pain and palpitations.  Musculoskeletal: Positive for neck pain. Negative for back pain.  Neurological: Negative for dizziness and light-headedness.       See hpi.  Hematological: Negative for adenopathy. Does not bruise/bleed easily.  Psychiatric/Behavioral: Negative for behavioral problems and confusion.    Past Medical History:  Diagnosis Date  . 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 COPD (chronic obstructive pulmonary disease) ([place])   . Depression 03/08/2014  . Diabetes mellitus   . GERD (gastroesophageal reflux disease) 03/08/2014  . History of blood transfusion   . Hyperlipemia   . Hypertension   . OSA (obstructive sleep apnea)   . Osteoarthritis   . Rheumatoid arthritis(714.0)   . Sleep apnea    cpap     Social History   Socioeconomic History  . Marital status: Married    Spouse name: Not on file  . Number of children: 6  . Years of education: Not on file  . Highest education level: Not on file  Occupational History   . Occupation: Retired    Comment: Glass blower/designer  . Financial resource strain: Not on file  . Food insecurity    Worry: Not on file    Inability: Not on file  . Transportation needs    Medical: Not on file    Non-medical: Not on file  Tobacco Use  . Smoking status: Former Smoker    Packs/day: 4.00    Years: 30.00    Pack years: 120.00    Types: Cigarettes    Quit date: 01/21/1978    Years since quitting: 40.8  . Smokeless tobacco: Never Used  Substance and Sexual Activity  . Alcohol use: No  . Drug use: No  . Sexual activity: Not on file  Lifestyle  . Physical activity    Days per week: Not on file    Minutes per session: Not on file  . Stress: Not on file  Relationships  . Social Herbalist on phone: Not on file    Gets together: Not on file    Attends religious service: Not on file    Active member of club or organization: Not on file    Attends meetings of clubs or organizations: Not on file    Relationship status: Not on file  . Intimate partner violence  Fear of current or ex partner: Not on file    Emotionally abused: Not on file    Physically abused: Not on file    Forced sexual activity: Not on file  Other Topics Concern  . Not on file  Social History Narrative  . Not on file    Past Surgical History:  Procedure Laterality Date  . APPENDECTOMY  1969  . CHOLECYSTECTOMY N/A 06/16/2014   Procedure: LAPAROSCOPIC CHOLECYSTECTOMY;  Surgeon: Greer Pickerel, MD;  Location: [facility];  Service: General;  Laterality: N/A;  . CYSTOSCOPY  01/23/2011   Procedure: CYSTOSCOPY;  Surgeon: Molli Hazard, MD;  Location: [facility];  Service: Urology;  Laterality: N/A;  . FLEXIBLE SIGMOIDOSCOPY  01/07/2012   Procedure: FLEXIBLE SIGMOIDOSCOPY;  Surgeon: Ladene Artist, MD,FACG;  Location: [facility];  Service: Endoscopy;  Laterality: N/A;  . HERNIA REPAIR  1990   right and left inguinal  . NISSEN FUNDOPLICATION    . 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 TONSILLECTOMY  1937  . TRANSURETHRAL RESECTION OF PROSTATE  01/23/2011   Procedure: TRANSURETHRAL RESECTION OF THE PROSTATE WITH GYRUS INSTRUMENTS;  Surgeon: Molli Hazard, MD;  Location: [facility];  Service: Urology;  Laterality: N/A;  . VASECTOMY  1972    Family History  Problem Relation Age of Onset  . Heart disease Mother   . Emphysema Brother   . Cancer Daughter        breast    No Known Allergies  Current Outpatient Medications on File Prior to Visit  Medication Sig Dispense Refill  . albuterol (VENTOLIN HFA) 108 (90 Base) MCG/ACT inhaler Inhale 2 puffs into the lungs every 6 (six) hours as needed for wheezing or shortness of breath. 8 g 2  . aspirin 81 MG tablet Take 1 tablet (81 mg total) by mouth every morning. Stop for 1 week and then resume (Patient taking differently: Take 81 mg by mouth every morning. )    . atorvastatin (LIPITOR) 40 MG tablet TAKE 1 TABLET BY MOUTH ONCE DAILY IN THE MORNING 90 tablet 0  . celecoxib (CELEBREX) 200 MG capsule TAKE 1 CAPSULE BY MOUTH ONCE DAILY IN THE EVENING 30 capsule 0  . cholecalciferol (VITAMIN D) 1000 UNITS tablet Take 2,000 Units by mouth 2 (two) times daily.     . diclofenac sodium (VOLTAREN) 1 % GEL Apply 2 g topically 4 (four) times daily. 50 g 0  . enalapril (VASOTEC) 2.5 MG tablet Take 2.5 mg by mouth every morning. Reported on 07/12/2015    . glucose blood (BAYER CONTOUR TEST) test strip Use as instructed to check blood sugar twice a day.  DX  E11.9 100 each 5  . Indacaterol Maleate (ARCAPTA NEOHALER) 75 MCG CAPS Place into inhaler and inhale.    . meclizine (ANTIVERT) 12.5 MG tablet Take 1 tablet (12.5 mg total) by mouth 3 (three) times daily as needed for dizziness. 30 tablet 0  . metFORMIN (GLUCOPHAGE) 500 MG tablet TAKE 1 TABLET BY MOUTH THREE TIMES DAILY 90 tablet 0  . misoprostol (CYTOTEC) 100 MCG tablet TAKE 1 TABLET BY MOUTH TWICE DAILY (MORNING AND EVENING) AND TAKE 2  TABLETS AT BEDTIME 120 tablet 0  . Multiple Vitamins-Minerals (CENTRUM) tablet Take 1 tablet by mouth every morning. Reported on 01/05/2015    . OVER THE COUNTER MEDICATION Place 1-2 drops into both eyes daily as needed (dry red eyes.). Reported on 01/05/2015    . pioglitazone (ACTOS) 15 MG  tablet Take 1 tablet by mouth once daily 90 tablet 0  . Tiotropium Bromide-Olodaterol (STIOLTO RESPIMAT) 2.5-2.5 MCG/ACT AERS Inhale 2 puffs into the lungs daily. 1 g 0  . Tiotropium Bromide-Olodaterol (STIOLTO RESPIMAT) 2.5-2.5 MCG/ACT AERS Inhale 2 puffs into the lungs daily. 4 g 6  . UNABLE TO FIND CPAP    . venlafaxine XR (EFFEXOR-XR) 37.5 MG 24 hr capsule TAKE 1 CAPSULE BY MOUTH ONCE DAILY WITH BREAKFAST 90 capsule 0  . vitamin C (ASCORBIC ACID) 500 MG tablet Take 500 mg by mouth every morning.      No current facility-administered medications on file prior to visit.     BP 119/65   Pulse (!) 105   Temp (!) 96.4 F (35.8 C) (Temporal)   Resp 16   Ht 6' (1.829 m)   Wt 251 lb 6.4 oz (114 kg)   SpO2 96% Comment: on 2 liters of oxygen  BMI 34.10 kg/m       Objective:   Physical Exam  General Mental Status- Alert. General Appearance- Not in acute distress.   Skin General: Color- Normal Color. Moisture- Normal Moisture.  Neck Carotid Arteries- Normal color. Moisture- Normal Moisture. No carotid bruits. No JVD. Mild mid lower cspine tedner.  Chest and Lung Exam Auscultation: Breath Sounds:-Normal.  Cardiovascular Auscultation:Rythm- Regular. Murmurs & Other Heart Sounds:Auscultation of the heart reveals- No Murmurs.  Abdomen Inspection:-Inspeection Normal. Palpation/Percussion:Note:No mass. Palpation and Percussion of the abdomen reveal- Non Tender, Non Distended + BS, no rebound or guarding.   Neurologic Cranial Nerve exam:- CN III-XII intact(No nystagmus), symmetric smile. Drift Test:- No drift. Romberg Exam:- Negative.  Heal to Toe Gait exam:-Normal. Finger to Nose:-  Normal/Intact Strength:- 5/5 equal and symmetric strength both upper and lower extremities.      Assessment & Plan:   I am glad to hear that you have not had any recurrent lightheaded episodes/so much he felt he might pass out.  I think there is probably worse related to excess duration of continuous walking.  Your age and general health as well as COPD likely played a role in that particular event.  Your blood pressure is much better now than it was then.  If you have any recurrent symptoms like that then check your blood pressure, pulse, O2 sat and sugar level.  Make sure you are wearing your oxygen and try to limit continuous activity.  Will get CBC  and CMP today.  Evaluate blood volume and electrolytes.  For your recent neck pain with radiating features to your right upper extremity, will get cervical spine x-ray.  Presently for pain that you are describing recommend take Celebrex daily and also take Tylenol about 1 hour or so before going to sleep.  If cervical spine x-ray findings indicate reason for radicular/radiating pain then might need to consider stronger medication but sedation side effects is concern  I did write you a prescription for a new walker that would match your height.  Follow-up date to be determined after lab review.  40 minutes spent with pt. 50% of time spent counseling on plan going forward. Also answered all questions.

## 2018-11-09 NOTE — Telephone Encounter (Signed)
Left message on machine for Mr Lance Powell and patient has appointment later today.

## 2018-11-09 NOTE — Patient Instructions (Signed)
I am glad to hear that you have not had any recurrent lightheaded episodes/so much he felt he might pass out.  I think there is probably worse related to excess duration of continuous walking.  Your age and general health as well as COPD likely played a role in that particular event.  Your blood pressure is much better now than it was then.  If you have any recurrent symptoms like that then check your blood pressure, pulse, O2 sat and sugar level.  Make sure you are wearing your oxygen and try to limit continuous activity.  Will get CBC  and CMP today.  Evaluate blood volume and electrolytes.  For your recent neck pain with radiating features to your right upper extremity, will get cervical spine x-ray.  Presently for pain that you are describing recommend take Celebrex daily and also take Tylenol about 1 hour or so before going to sleep.  If cervical spine x-ray findings indicate reason for radicular/radiating pain then might need to consider stronger medication but sedation side effects is concern  I did write you a prescription for a new walker that would match your height.  Follow-up date to be determined after lab review.

## 2018-11-10 ENCOUNTER — Telehealth: Payer: Self-pay | Admitting: Medical

## 2018-11-10 DIAGNOSIS — M069 Rheumatoid arthritis, unspecified: Secondary | ICD-10-CM | POA: Diagnosis not present

## 2018-11-10 DIAGNOSIS — J449 Chronic obstructive pulmonary disease, unspecified: Secondary | ICD-10-CM | POA: Diagnosis not present

## 2018-11-10 DIAGNOSIS — M1990 Unspecified osteoarthritis, unspecified site: Secondary | ICD-10-CM | POA: Diagnosis not present

## 2018-11-10 DIAGNOSIS — Z9181 History of falling: Secondary | ICD-10-CM | POA: Diagnosis not present

## 2018-11-10 DIAGNOSIS — G4733 Obstructive sleep apnea (adult) (pediatric): Secondary | ICD-10-CM | POA: Diagnosis not present

## 2018-11-10 DIAGNOSIS — B369 Superficial mycosis, unspecified: Secondary | ICD-10-CM | POA: Diagnosis not present

## 2018-11-10 DIAGNOSIS — Z9989 Dependence on other enabling machines and devices: Secondary | ICD-10-CM | POA: Diagnosis not present

## 2018-11-10 DIAGNOSIS — E785 Hyperlipidemia, unspecified: Secondary | ICD-10-CM | POA: Diagnosis not present

## 2018-11-10 DIAGNOSIS — N312 Flaccid neuropathic bladder, not elsewhere classified: Secondary | ICD-10-CM | POA: Diagnosis not present

## 2018-11-10 DIAGNOSIS — E119 Type 2 diabetes mellitus without complications: Secondary | ICD-10-CM | POA: Diagnosis not present

## 2018-11-10 DIAGNOSIS — Z7984 Long term (current) use of oral hypoglycemic drugs: Secondary | ICD-10-CM | POA: Diagnosis not present

## 2018-11-10 DIAGNOSIS — F329 Major depressive disorder, single episode, unspecified: Secondary | ICD-10-CM | POA: Diagnosis not present

## 2018-11-10 DIAGNOSIS — I10 Essential (primary) hypertension: Secondary | ICD-10-CM | POA: Diagnosis not present

## 2018-11-10 DIAGNOSIS — K219 Gastro-esophageal reflux disease without esophagitis: Secondary | ICD-10-CM | POA: Diagnosis not present

## 2018-11-10 DIAGNOSIS — E875 Hyperkalemia: Secondary | ICD-10-CM

## 2018-11-10 DIAGNOSIS — N39498 Other specified urinary incontinence: Secondary | ICD-10-CM | POA: Diagnosis not present

## 2018-11-10 DIAGNOSIS — Z9981 Dependence on supplemental oxygen: Secondary | ICD-10-CM | POA: Diagnosis not present

## 2018-11-10 LAB — COMPREHENSIVE METABOLIC PANEL
ALT: 16 U/L (ref 0–53)
AST: 14 U/L (ref 0–37)
Albumin: 4.2 g/dL (ref 3.5–5.2)
Alkaline Phosphatase: 85 U/L (ref 39–117)
BUN: 19 mg/dL (ref 6–23)
CO2: 30 mEq/L (ref 19–32)
Calcium: 10.8 mg/dL — ABNORMAL HIGH (ref 8.4–10.5)
Chloride: 103 mEq/L (ref 96–112)
Creatinine, Ser: 1.13 mg/dL (ref 0.40–1.50)
GFR: 61.37 mL/min (ref >60.00)
Glucose, Bld: 89 mg/dL (ref 70–99)
Potassium: 5.3 mEq/L — ABNORMAL HIGH (ref 3.5–5.1)
Sodium: 140 mEq/L (ref 135–145)
Total Bilirubin: 0.5 mg/dL (ref 0.2–1.2)
Total Protein: 6.7 g/dL (ref 6.0–8.3)

## 2018-11-10 LAB — CBC WITH DIFFERENTIAL/PLATELET
Basophils Absolute: 0.1 10*3/uL (ref 0.0–0.1)
Basophils Relative: 1.4 % (ref 0.0–3.0)
Eosinophils Absolute: 0.2 10*3/uL (ref 0.0–0.7)
Eosinophils Relative: 2.2 % (ref 0.0–5.0)
HCT: 46.9 % (ref 39.0–52.0)
Hemoglobin: 15.4 g/dL (ref 13.0–17.0)
Lymphocytes Relative: 23.8 % (ref 12.0–46.0)
Lymphs Abs: 2.4 10*3/uL (ref 0.7–4.0)
MCHC: 32.8 g/dL (ref 30.0–36.0)
MCV: 97 fl (ref 78.0–100.0)
Monocytes Absolute: 1.3 10*3/uL — ABNORMAL HIGH (ref 0.1–1.0)
Monocytes Relative: 12.9 % — ABNORMAL HIGH (ref 3.0–12.0)
Neutro Abs: 5.9 10*3/uL (ref 1.4–7.7)
Neutrophils Relative %: 59.7 % (ref 43.0–77.0)
Platelets: 237 10*3/uL (ref 150.0–400.0)
RBC: 4.84 Mil/uL (ref 4.22–5.81)
RDW: 14.6 % (ref 11.5–15.5)
WBC: 9.9 10*3/uL (ref 4.0–10.5)

## 2018-11-10 NOTE — Telephone Encounter (Signed)
Future cmp placed to check k level.

## 2018-11-12 ENCOUNTER — Telehealth: Payer: Self-pay | Admitting: Medical

## 2018-11-12 ENCOUNTER — Telehealth: Payer: Self-pay

## 2018-11-12 DIAGNOSIS — M1611 Unilateral primary osteoarthritis, right hip: Secondary | ICD-10-CM | POA: Diagnosis not present

## 2018-11-12 NOTE — Telephone Encounter (Signed)
Ok. Pt 1 x a week for 4 weeks. You can call Lance Powell and ok. But let him know I am aware of his light headed episode other day on endurance test. Exercise caution and let pt rest if needed.

## 2018-11-12 NOTE — Telephone Encounter (Signed)
Reviewed and signed PT evaluation form. Will you pull charge sheet and fax over.

## 2018-11-12 NOTE — Telephone Encounter (Signed)
Left Clair Gulling a message giving verbal orders.

## 2018-11-12 NOTE — Telephone Encounter (Signed)
Copied from [place] [id number]. Topic: Quick Communication - See Telephone Encounter >> Nov 09, 2018  4:48 PM Loma Boston wrote: CRM for notification. See Telephone encounter for: 11/09/18.was seen today and [place] is seeking a verbal on PT  for Physicl Th. for 1 wk 4 by Clair Gulling cb is [phone]

## 2018-11-13 NOTE — Telephone Encounter (Signed)
Ok. Then no charge. Thanks.

## 2018-11-13 NOTE — Telephone Encounter (Signed)
I faxed form to. I don't think we charge for these forms.

## 2018-11-16 ENCOUNTER — Other Ambulatory Visit: Payer: Self-pay | Admitting: Medical

## 2018-11-22 DIAGNOSIS — N39498 Other specified urinary incontinence: Secondary | ICD-10-CM

## 2018-11-22 DIAGNOSIS — B369 Superficial mycosis, unspecified: Secondary | ICD-10-CM

## 2018-11-22 DIAGNOSIS — Z9181 History of falling: Secondary | ICD-10-CM

## 2018-11-22 DIAGNOSIS — M1990 Unspecified osteoarthritis, unspecified site: Secondary | ICD-10-CM

## 2018-11-22 DIAGNOSIS — Z9989 Dependence on other enabling machines and devices: Secondary | ICD-10-CM

## 2018-11-22 DIAGNOSIS — Z9981 Dependence on supplemental oxygen: Secondary | ICD-10-CM

## 2018-11-22 DIAGNOSIS — F329 Major depressive disorder, single episode, unspecified: Secondary | ICD-10-CM

## 2018-11-22 DIAGNOSIS — J449 Chronic obstructive pulmonary disease, unspecified: Secondary | ICD-10-CM | POA: Diagnosis not present

## 2018-11-22 DIAGNOSIS — Z7982 Long term (current) use of aspirin: Secondary | ICD-10-CM

## 2018-11-22 DIAGNOSIS — K219 Gastro-esophageal reflux disease without esophagitis: Secondary | ICD-10-CM

## 2018-11-22 DIAGNOSIS — I10 Essential (primary) hypertension: Secondary | ICD-10-CM | POA: Diagnosis not present

## 2018-11-22 DIAGNOSIS — Z7984 Long term (current) use of oral hypoglycemic drugs: Secondary | ICD-10-CM

## 2018-11-22 DIAGNOSIS — Z87891 Personal history of nicotine dependence: Secondary | ICD-10-CM

## 2018-11-22 DIAGNOSIS — M069 Rheumatoid arthritis, unspecified: Secondary | ICD-10-CM | POA: Diagnosis not present

## 2018-11-22 DIAGNOSIS — N312 Flaccid neuropathic bladder, not elsewhere classified: Secondary | ICD-10-CM

## 2018-11-22 DIAGNOSIS — E785 Hyperlipidemia, unspecified: Secondary | ICD-10-CM

## 2018-11-22 DIAGNOSIS — E119 Type 2 diabetes mellitus without complications: Secondary | ICD-10-CM | POA: Diagnosis not present

## 2018-11-22 DIAGNOSIS — G4733 Obstructive sleep apnea (adult) (pediatric): Secondary | ICD-10-CM

## 2018-11-27 ENCOUNTER — Other Ambulatory Visit: Payer: Self-pay

## 2018-11-27 ENCOUNTER — Other Ambulatory Visit (INDEPENDENT_AMBULATORY_CARE_PROVIDER_SITE_OTHER): Payer: Medicare Other

## 2018-11-27 DIAGNOSIS — E875 Hyperkalemia: Secondary | ICD-10-CM

## 2018-11-27 LAB — COMPREHENSIVE METABOLIC PANEL
ALT: 16 U/L (ref 0–53)
AST: 13 U/L (ref 0–37)
Albumin: 4.3 g/dL (ref 3.5–5.2)
Alkaline Phosphatase: 85 U/L (ref 39–117)
BUN: 22 mg/dL (ref 6–23)
CO2: 30 mEq/L (ref 19–32)
Calcium: 10.4 mg/dL (ref 8.4–10.5)
Chloride: 105 mEq/L (ref 96–112)
Creatinine, Ser: 1.18 mg/dL (ref 0.40–1.50)
GFR: 58.37 mL/min — ABNORMAL LOW (ref >60.00)
Glucose, Bld: 106 mg/dL — ABNORMAL HIGH (ref 70–99)
Potassium: 4.4 mEq/L (ref 3.5–5.1)
Sodium: 144 mEq/L (ref 135–145)
Total Bilirubin: 0.4 mg/dL (ref 0.2–1.2)
Total Protein: 7.1 g/dL (ref 6.0–8.3)

## 2018-12-01 ENCOUNTER — Telehealth: Payer: Self-pay | Admitting: Podiatry

## 2018-12-01 DIAGNOSIS — J449 Chronic obstructive pulmonary disease, unspecified: Secondary | ICD-10-CM | POA: Diagnosis not present

## 2018-12-01 DIAGNOSIS — J9610 Chronic respiratory failure, unspecified whether with hypoxia or hypercapnia: Secondary | ICD-10-CM | POA: Diagnosis not present

## 2018-12-01 DIAGNOSIS — G4733 Obstructive sleep apnea (adult) (pediatric): Secondary | ICD-10-CM | POA: Diagnosis not present

## 2018-12-01 DIAGNOSIS — Z0279 Encounter for issue of other medical certificate: Secondary | ICD-10-CM | POA: Diagnosis not present

## 2018-12-01 NOTE — Telephone Encounter (Signed)
Pt called and said he just called his insurance company and the diabetic shoes are not covered because we are a vendor. I explained that we are not a vendor and that the billing is done under the doctor that treats him.But we have ordered the custom diabetic inserts and they cannot be returned and I would have to discuss with Liliane Channel.  Per Liliane Channel we will cancel the order. I have contacted the pt and told him that we would cancel the order but he has a medicare advantage plan and they follow the medicare quide lines and are covered and we dispense hundreds of them a year.

## 2018-12-03 ENCOUNTER — Other Ambulatory Visit: Payer: Self-pay | Admitting: Medical

## 2018-12-07 ENCOUNTER — Telehealth: Payer: Self-pay | Admitting: Medical

## 2018-12-07 NOTE — Telephone Encounter (Signed)
Verbal ok PT.

## 2018-12-07 NOTE — Telephone Encounter (Unsigned)
Copied from [place] #[id number]. Topic: Quick Communication - Home Health Verbal Orders >> Dec 07, 2018  1:47 PM Yvette Rack wrote: Caller/Agency: Clair Gulling with [employer] Callback Number: [phone] Requesting OT/PT/Skilled Nursing/Social Work/Speech Therapy: PT Frequency: 1 time a week for 4 weeks

## 2018-12-08 ENCOUNTER — Other Ambulatory Visit: Payer: Self-pay

## 2018-12-08 NOTE — Telephone Encounter (Signed)
Left message giving Clair Gulling verbal orders

## 2018-12-09 ENCOUNTER — Other Ambulatory Visit: Payer: Self-pay

## 2018-12-09 ENCOUNTER — Encounter: Payer: Self-pay | Admitting: Medical

## 2018-12-09 ENCOUNTER — Ambulatory Visit (INDEPENDENT_AMBULATORY_CARE_PROVIDER_SITE_OTHER): Payer: Medicare Other | Admitting: Medical

## 2018-12-09 VITALS — BP 136/67 | HR 98 | Temp 96.2°F | Resp 16 | Ht 72.0 in | Wt 254.0 lb

## 2018-12-09 DIAGNOSIS — E118 Type 2 diabetes mellitus with unspecified complications: Secondary | ICD-10-CM | POA: Diagnosis not present

## 2018-12-09 DIAGNOSIS — Z87891 Personal history of nicotine dependence: Secondary | ICD-10-CM | POA: Diagnosis not present

## 2018-12-09 DIAGNOSIS — R413 Other amnesia: Secondary | ICD-10-CM

## 2018-12-09 DIAGNOSIS — Z7982 Long term (current) use of aspirin: Secondary | ICD-10-CM | POA: Diagnosis not present

## 2018-12-09 DIAGNOSIS — J449 Chronic obstructive pulmonary disease, unspecified: Secondary | ICD-10-CM | POA: Diagnosis not present

## 2018-12-09 DIAGNOSIS — R5383 Other fatigue: Secondary | ICD-10-CM | POA: Diagnosis not present

## 2018-12-09 DIAGNOSIS — E785 Hyperlipidemia, unspecified: Secondary | ICD-10-CM

## 2018-12-09 DIAGNOSIS — M069 Rheumatoid arthritis, unspecified: Secondary | ICD-10-CM | POA: Diagnosis not present

## 2018-12-09 DIAGNOSIS — F329 Major depressive disorder, single episode, unspecified: Secondary | ICD-10-CM | POA: Diagnosis not present

## 2018-12-09 DIAGNOSIS — I10 Essential (primary) hypertension: Secondary | ICD-10-CM | POA: Diagnosis not present

## 2018-12-09 DIAGNOSIS — Z794 Long term (current) use of insulin: Secondary | ICD-10-CM

## 2018-12-09 DIAGNOSIS — Z9981 Dependence on supplemental oxygen: Secondary | ICD-10-CM | POA: Diagnosis not present

## 2018-12-09 DIAGNOSIS — N312 Flaccid neuropathic bladder, not elsewhere classified: Secondary | ICD-10-CM | POA: Diagnosis not present

## 2018-12-09 DIAGNOSIS — B369 Superficial mycosis, unspecified: Secondary | ICD-10-CM | POA: Diagnosis not present

## 2018-12-09 DIAGNOSIS — G4733 Obstructive sleep apnea (adult) (pediatric): Secondary | ICD-10-CM | POA: Diagnosis not present

## 2018-12-09 DIAGNOSIS — Z7984 Long term (current) use of oral hypoglycemic drugs: Secondary | ICD-10-CM | POA: Diagnosis not present

## 2018-12-09 DIAGNOSIS — K219 Gastro-esophageal reflux disease without esophagitis: Secondary | ICD-10-CM | POA: Diagnosis not present

## 2018-12-09 DIAGNOSIS — E875 Hyperkalemia: Secondary | ICD-10-CM | POA: Diagnosis not present

## 2018-12-09 DIAGNOSIS — N39498 Other specified urinary incontinence: Secondary | ICD-10-CM | POA: Diagnosis not present

## 2018-12-09 DIAGNOSIS — E119 Type 2 diabetes mellitus without complications: Secondary | ICD-10-CM | POA: Diagnosis not present

## 2018-12-09 DIAGNOSIS — M1990 Unspecified osteoarthritis, unspecified site: Secondary | ICD-10-CM | POA: Diagnosis not present

## 2018-12-09 LAB — CBC WITH DIFFERENTIAL/PLATELET
Basophils Absolute: 0.1 10*3/uL (ref 0.0–0.1)
Basophils Relative: 0.7 % (ref 0.0–3.0)
Eosinophils Absolute: 0.3 10*3/uL (ref 0.0–0.7)
Eosinophils Relative: 2.9 % (ref 0.0–5.0)
HCT: 46.6 % (ref 39.0–52.0)
Hemoglobin: 15.4 g/dL (ref 13.0–17.0)
Lymphocytes Relative: 22.8 % (ref 12.0–46.0)
Lymphs Abs: 2.1 10*3/uL (ref 0.7–4.0)
MCHC: 33 g/dL (ref 30.0–36.0)
MCV: 96 fl (ref 78.0–100.0)
Monocytes Absolute: 1 10*3/uL (ref 0.1–1.0)
Monocytes Relative: 10.8 % (ref 3.0–12.0)
Neutro Abs: 5.8 10*3/uL (ref 1.4–7.7)
Neutrophils Relative %: 62.8 % (ref 43.0–77.0)
Platelets: 194 10*3/uL (ref 150.0–400.0)
RBC: 4.86 Mil/uL (ref 4.22–5.81)
RDW: 14.6 % (ref 11.5–15.5)
WBC: 9.2 10*3/uL (ref 4.0–10.5)

## 2018-12-09 LAB — COMPREHENSIVE METABOLIC PANEL
ALT: 17 U/L (ref 0–53)
AST: 13 U/L (ref 0–37)
Albumin: 4.1 g/dL (ref 3.5–5.2)
Alkaline Phosphatase: 87 U/L (ref 39–117)
BUN: 19 mg/dL (ref 6–23)
CO2: 28 mEq/L (ref 19–32)
Calcium: 10.4 mg/dL (ref 8.4–10.5)
Chloride: 102 mEq/L (ref 96–112)
Creatinine, Ser: 1.05 mg/dL (ref 0.40–1.50)
GFR: 66.78 mL/min (ref >60.00)
Glucose, Bld: 122 mg/dL — ABNORMAL HIGH (ref 70–99)
Potassium: 4.3 mEq/L (ref 3.5–5.1)
Sodium: 140 mEq/L (ref 135–145)
Total Bilirubin: 0.4 mg/dL (ref 0.2–1.2)
Total Protein: 6.8 g/dL (ref 6.0–8.3)

## 2018-12-09 LAB — HEMOGLOBIN A1C: Hgb A1c MFr Bld: 6.8 % — ABNORMAL HIGH (ref 4.6–6.5)

## 2018-12-09 NOTE — Patient Instructions (Addendum)
For your copd history, continue oxygen daily and current inhalers. Would ask your pulmonologist for sample as your current insurance expires or maybe try to fill rx at [place].(maybe coupon from manufacturer could be used?)  Glad to hear PT going well and you tolerance improved since last visit.  Your potassium on last check came back in middle normal range.   Sugar level good this morning. Will check you a1c today.   You have brief episode of fatigue earlier in the day. Reminder if that were to occur again would check sugar level. Any sugar less than 70 have snack to increase sugar.  Will get cbc and cmp to evaluate fatigue earlier today but now resolved.  Follow up 3 months or as needed

## 2018-12-09 NOTE — Progress Notes (Signed)
Subjective:    Patient ID: Lance Powell, male    DOB: 12-06-1929, 85 y.o.   MRN: [id number]  HPI  Pt in for follow up.  Pt states feels well now but one hour felt tired for about 30 minutes. Pt states his oxygen was on. Pt did not check his sugar at that time. But this morning his sugar was 110.   Pt since last visit has done better with PT. In past he got sob with PT but they did some type of testing that challenged him. Since that day he describe PT not that aggressive.  Pt skilled nursing was just coming over to check vitals.  Pt k on last check came back into mid normal range. Sugar was 106. GFR was stable.  Pt is self isolating at home. Will limit contact over thanksgiving.     Review of Systems  Constitutional: Negative for chills, fatigue and fever.  Respiratory: Negative for cough, chest tightness and shortness of breath.   Cardiovascular: Negative for chest pain and palpitations.  Gastrointestinal: Negative for abdominal distention, abdominal pain and anal bleeding.  Genitourinary: Negative for decreased urine volume, difficulty urinating, enuresis, frequency, hematuria, penile swelling and testicular pain.  Musculoskeletal: Negative for back pain, gait problem, myalgias and neck stiffness.  Skin: Negative for rash.  Neurological: Negative for dizziness, seizures, syncope, weakness and headaches.  Hematological: Negative for adenopathy. Does not bruise/bleed easily.  Psychiatric/Behavioral: Negative for behavioral problems, decreased concentration and suicidal ideas. The patient is not nervous/anxious.     Past Medical History:  Diagnosis Date  . 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 COPD (chronic obstructive pulmonary disease) ([place])   . Depression 03/08/2014  . Diabetes mellitus   . GERD (gastroesophageal reflux disease) 03/08/2014  . History of blood transfusion   . Hyperlipemia   . Hypertension   . OSA (obstructive sleep apnea)   . Osteoarthritis   .  Rheumatoid arthritis(714.0)   . Sleep apnea    cpap     Social History   Socioeconomic History  . Marital status: Married    Spouse name: Not on file  . Number of children: 6  . Years of education: Not on file  . Highest education level: Not on file  Occupational History  . Occupation: Retired    Comment: Glass blower/designer  . Financial resource strain: Not on file  . Food insecurity    Worry: Not on file    Inability: Not on file  . Transportation needs    Medical: Not on file    Non-medical: Not on file  Tobacco Use  . Smoking status: Former Smoker    Packs/day: 4.00    Years: 30.00    Pack years: 120.00    Types: Cigarettes    Quit date: 01/21/1978    Years since quitting: 40.9  . Smokeless tobacco: Never Used  Substance and Sexual Activity  . Alcohol use: No  . Drug use: No  . Sexual activity: Not on file  Lifestyle  . Physical activity    Days per week: Not on file    Minutes per session: Not on file  . Stress: Not on file  Relationships  . Social Herbalist on phone: Not on file    Gets together: Not on file    Attends religious service: Not on file    Active member of club or organization: Not on file    Attends meetings of clubs or organizations: Not  on file    Relationship status: Not on file  . Intimate partner violence    Fear of current or ex partner: Not on file    Emotionally abused: Not on file    Physically abused: Not on file    Forced sexual activity: Not on file  Other Topics Concern  . Not on file  Social History Narrative  . Not on file    Past Surgical History:  Procedure Laterality Date  . APPENDECTOMY  1969  . CHOLECYSTECTOMY N/A 06/16/2014   Procedure: LAPAROSCOPIC CHOLECYSTECTOMY;  Surgeon: Greer Pickerel, MD;  Location: [facility];  Service: General;  Laterality: N/A;  . CYSTOSCOPY  01/23/2011   Procedure: CYSTOSCOPY;  Surgeon: Molli Hazard, MD;  Location: [facility];  Service: Urology;   Laterality: N/A;  . FLEXIBLE SIGMOIDOSCOPY  01/07/2012   Procedure: FLEXIBLE SIGMOIDOSCOPY;  Surgeon: Ladene Artist, MD,FACG;  Location: [facility];  Service: Endoscopy;  Laterality: N/A;  . HERNIA REPAIR  1990   right and left inguinal  . NISSEN FUNDOPLICATION    . 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 TONSILLECTOMY  1937  . TRANSURETHRAL RESECTION OF PROSTATE  01/23/2011   Procedure: TRANSURETHRAL RESECTION OF THE PROSTATE WITH GYRUS INSTRUMENTS;  Surgeon: Molli Hazard, MD;  Location: [facility];  Service: Urology;  Laterality: N/A;  . VASECTOMY  1972    Family History  Problem Relation Age of Onset  . Heart disease Mother   . Emphysema Brother   . Cancer Daughter        breast    No Known Allergies  Current Outpatient Medications on File Prior to Visit  Medication Sig Dispense Refill  . albuterol (VENTOLIN HFA) 108 (90 Base) MCG/ACT inhaler Inhale 2 puffs into the lungs every 6 (six) hours as needed for wheezing or shortness of breath. 8 g 2  . aspirin 81 MG tablet Take 1 tablet (81 mg total) by mouth every morning. Stop for 1 week and then resume (Patient taking differently: Take 81 mg by mouth every morning. )    . atorvastatin (LIPITOR) 40 MG tablet TAKE 1 TABLET BY MOUTH ONCE DAILY IN THE MORNING 90 tablet 0  . celecoxib (CELEBREX) 200 MG capsule TAKE 1 CAPSULE BY MOUTH ONCE DAILY IN THE EVENING 30 capsule 0  . cholecalciferol (VITAMIN D) 1000 UNITS tablet Take 2,000 Units by mouth 2 (two) times daily.     . diclofenac sodium (VOLTAREN) 1 % GEL Apply 2 g topically 4 (four) times daily. 50 g 0  . enalapril (VASOTEC) 2.5 MG tablet Take 2.5 mg by mouth every morning. Reported on 07/12/2015    . glucose blood (BAYER CONTOUR TEST) test strip Use as instructed to check blood sugar twice a day.  DX  E11.9 100 each 5  . Indacaterol Maleate (ARCAPTA NEOHALER) 75 MCG CAPS Place into inhaler and inhale.    . meclizine (ANTIVERT) 12.5 MG tablet Take 1  tablet (12.5 mg total) by mouth 3 (three) times daily as needed for dizziness. 30 tablet 0  . metFORMIN (GLUCOPHAGE) 500 MG tablet TAKE 1 TABLET BY MOUTH THREE TIMES DAILY 90 tablet 0  . misoprostol (CYTOTEC) 100 MCG tablet TAKE 1 TABLET BY MOUTH TWICE DAILY (IN THE MORNING AND IN THE EVENING) AND 2 AT BEDTIME 120 tablet 0  . Multiple Vitamins-Minerals (CENTRUM) tablet Take 1 tablet by mouth every morning. Reported on 01/05/2015    . OVER THE COUNTER MEDICATION Place 1-2 drops  into both eyes daily as needed (dry red eyes.). Reported on 01/05/2015    . pioglitazone (ACTOS) 15 MG tablet Take 1 tablet by mouth once daily 90 tablet 0  . Tiotropium Bromide-Olodaterol (STIOLTO RESPIMAT) 2.5-2.5 MCG/ACT AERS Inhale 2 puffs into the lungs daily. 1 g 0  . Tiotropium Bromide-Olodaterol (STIOLTO RESPIMAT) 2.5-2.5 MCG/ACT AERS Inhale 2 puffs into the lungs daily. 4 g 6  . UNABLE TO FIND CPAP    . venlafaxine XR (EFFEXOR-XR) 37.5 MG 24 hr capsule TAKE 1 CAPSULE BY MOUTH ONCE DAILY WITH BREAKFAST 90 capsule 0  . vitamin C (ASCORBIC ACID) 500 MG tablet Take 500 mg by mouth every morning.      No current facility-administered medications on file prior to visit.     BP 136/67   Pulse 98   Temp (!) 96.2 F (35.7 C) (Temporal)   Resp 16   Ht 6' (1.829 m)   Wt 254 lb (115.2 kg)   SpO2 94%   BMI 34.45 kg/m       Objective:   Physical Exam  General Mental Status- Alert. General Appearance- Not in acute distress.   Skin General: Color- Normal Color. Moisture- Normal Moisture.  Neck Carotid Arteries- Normal color. Moisture- Normal Moisture. No carotid bruits. No JVD.  Chest and Lung Exam Auscultation: Breath Sounds:-Normal.  Cardiovascular Auscultation:Rythm- Regular. Murmurs & Other Heart Sounds:Auscultation of the heart reveals- No Murmurs.  Abdomen Inspection:-Inspeection Normal. Palpation/Percussion:Note:No mass. Palpation and Percussion of the abdomen reveal- Non Tender, Non Distended  + BS, no rebound or guarding.  Neurologic Cranial Nerve exam:- CN III-XII intact(No nystagmus), symmetric smile. Strength:- 5/5 equal and symmetric strength both upper and lower extremities.  Lower ext- no pedal edema.     Assessment & Plan:

## 2018-12-11 ENCOUNTER — Ambulatory Visit: Payer: Self-pay | Admitting: *Deleted

## 2018-12-11 ENCOUNTER — Ambulatory Visit: Payer: Medicare Other | Admitting: Medical

## 2018-12-11 NOTE — Telephone Encounter (Signed)
Pt would like a call back lab results   Notes recorded by [name], PA-C on 12/10/2018 at 7:57 PM EST  Your kidney function looks good. Sugar 122 at time of labs. Sugar average about 146 over past 3 months. Which is good. Infection fighting cells normal and no anemia. Follow up in 3 months or as needed.  Patient notified of lab results and PCP recommendation. Patient declines to make appointment at this time. Patient does request copy of labs with PCP comment. Advised him I would put that request in.  Reason for Disposition . Caller requesting lab results  Protocols used: PCP CALL - NO TRIAGE-A-AH

## 2018-12-14 DIAGNOSIS — J9610 Chronic respiratory failure, unspecified whether with hypoxia or hypercapnia: Secondary | ICD-10-CM | POA: Diagnosis not present

## 2018-12-14 DIAGNOSIS — G4733 Obstructive sleep apnea (adult) (pediatric): Secondary | ICD-10-CM | POA: Diagnosis not present

## 2018-12-14 DIAGNOSIS — J449 Chronic obstructive pulmonary disease, unspecified: Secondary | ICD-10-CM | POA: Diagnosis not present

## 2018-12-14 NOTE — Telephone Encounter (Signed)
Send pt letter in the mail.

## 2018-12-19 ENCOUNTER — Other Ambulatory Visit: Payer: Self-pay | Admitting: Medical

## 2018-12-21 ENCOUNTER — Other Ambulatory Visit: Payer: Self-pay | Admitting: Medical

## 2018-12-21 MED ORDER — ATORVASTATIN CALCIUM 40 MG PO TABS: ORAL_TABLET | ORAL | 0 refills | Status: DC

## 2018-12-21 NOTE — Telephone Encounter (Signed)
Copied from [place] [id number]. Topic: Quick Communication - Rx Refill/Question >> Dec 21, 2018 12:49 PM Leward Quan A wrote: Medication: celecoxib (CELEBREX) 200 MG capsule   Per daughter patient is completely out   Has the patient contacted their pharmacy? Yes.   (Agent: If no, request that the patient contact the pharmacy for the refill.) (Agent: If yes, when and what did the pharmacy advise?)  Preferred Pharmacy (with phone number or street name): [place], [place] [phone] (Phone) [phone] (Fax)    Agent: Please be advised that RX refills may take up to 3 business days. We ask that you follow-up with your pharmacy.

## 2018-12-24 ENCOUNTER — Other Ambulatory Visit: Payer: Self-pay | Admitting: Medical

## 2018-12-30 ENCOUNTER — Telehealth: Payer: Self-pay | Admitting: Medical

## 2018-12-30 NOTE — Telephone Encounter (Signed)
Home Health Verbal Orders -[facility]   Caller/Agency: Herbert Deaner Callback Number: [phone] Requesting OT/PT/Skilled Nursing/Social Work/Speech Therapy: Physical Therapy  Frequency: 1x5

## 2018-12-31 NOTE — Telephone Encounter (Signed)
Verbal order given  

## 2019-01-07 ENCOUNTER — Other Ambulatory Visit: Payer: Self-pay | Admitting: Medical

## 2019-01-07 DIAGNOSIS — N312 Flaccid neuropathic bladder, not elsewhere classified: Secondary | ICD-10-CM | POA: Diagnosis not present

## 2019-01-07 DIAGNOSIS — Z87891 Personal history of nicotine dependence: Secondary | ICD-10-CM | POA: Diagnosis not present

## 2019-01-07 DIAGNOSIS — M069 Rheumatoid arthritis, unspecified: Secondary | ICD-10-CM | POA: Diagnosis not present

## 2019-01-07 DIAGNOSIS — G4733 Obstructive sleep apnea (adult) (pediatric): Secondary | ICD-10-CM | POA: Diagnosis not present

## 2019-01-07 DIAGNOSIS — K219 Gastro-esophageal reflux disease without esophagitis: Secondary | ICD-10-CM | POA: Diagnosis not present

## 2019-01-07 DIAGNOSIS — Z7982 Long term (current) use of aspirin: Secondary | ICD-10-CM | POA: Diagnosis not present

## 2019-01-07 DIAGNOSIS — F329 Major depressive disorder, single episode, unspecified: Secondary | ICD-10-CM | POA: Diagnosis not present

## 2019-01-07 DIAGNOSIS — N39498 Other specified urinary incontinence: Secondary | ICD-10-CM | POA: Diagnosis not present

## 2019-01-07 DIAGNOSIS — E119 Type 2 diabetes mellitus without complications: Secondary | ICD-10-CM | POA: Diagnosis not present

## 2019-01-07 DIAGNOSIS — M1990 Unspecified osteoarthritis, unspecified site: Secondary | ICD-10-CM | POA: Diagnosis not present

## 2019-01-07 DIAGNOSIS — B369 Superficial mycosis, unspecified: Secondary | ICD-10-CM | POA: Diagnosis not present

## 2019-01-07 DIAGNOSIS — Z7984 Long term (current) use of oral hypoglycemic drugs: Secondary | ICD-10-CM | POA: Diagnosis not present

## 2019-01-07 DIAGNOSIS — Z9981 Dependence on supplemental oxygen: Secondary | ICD-10-CM | POA: Diagnosis not present

## 2019-01-07 DIAGNOSIS — E785 Hyperlipidemia, unspecified: Secondary | ICD-10-CM | POA: Diagnosis not present

## 2019-01-07 DIAGNOSIS — I10 Essential (primary) hypertension: Secondary | ICD-10-CM | POA: Diagnosis not present

## 2019-01-07 DIAGNOSIS — J449 Chronic obstructive pulmonary disease, unspecified: Secondary | ICD-10-CM | POA: Diagnosis not present

## 2019-01-07 NOTE — Telephone Encounter (Signed)
Rx med refills sent to pt pharmacy.

## 2019-02-15 ENCOUNTER — Other Ambulatory Visit: Payer: Self-pay | Admitting: Medical

## 2019-02-22 ENCOUNTER — Telehealth: Payer: Self-pay | Admitting: Medical

## 2019-02-22 NOTE — Telephone Encounter (Signed)
Lance Powell patients daughter is calling about her father's covid vaccine. She wants to check and make sure that the prescribe meds he take wont have an effect on the vaccine. Please call [name] back at     [phone]

## 2019-02-24 NOTE — Telephone Encounter (Signed)
You don't have any allergy to medications. So I do think should be safe. There are some common side effects which fall along lines of mild-moderate viral illness type symptoms. Person have had some anaphylactic type reactions but this is really rare and usually in those who have hx of severe allergies.  Overall looking at benefit vs risk. In your age group and your risk score getting vaccine is more beneficial in my opinion compared to risk of reaction or getting covid itself.  You could ask your pharmacist opinion on your med list but I don't see problem.  Thanks,

## 2019-02-25 NOTE — Telephone Encounter (Signed)
Daughter notified of note.

## 2019-02-26 DIAGNOSIS — I6521 Occlusion and stenosis of right carotid artery: Secondary | ICD-10-CM | POA: Diagnosis not present

## 2019-02-26 DIAGNOSIS — R110 Nausea: Secondary | ICD-10-CM | POA: Diagnosis not present

## 2019-02-26 DIAGNOSIS — R52 Pain, unspecified: Secondary | ICD-10-CM | POA: Diagnosis not present

## 2019-02-26 DIAGNOSIS — R531 Weakness: Secondary | ICD-10-CM | POA: Diagnosis not present

## 2019-02-26 DIAGNOSIS — H9190 Unspecified hearing loss, unspecified ear: Secondary | ICD-10-CM | POA: Diagnosis not present

## 2019-02-26 DIAGNOSIS — H5500 Unspecified nystagmus: Secondary | ICD-10-CM | POA: Diagnosis not present

## 2019-02-26 DIAGNOSIS — R0602 Shortness of breath: Secondary | ICD-10-CM | POA: Diagnosis not present

## 2019-02-26 DIAGNOSIS — J439 Emphysema, unspecified: Secondary | ICD-10-CM | POA: Diagnosis not present

## 2019-02-26 DIAGNOSIS — R42 Dizziness and giddiness: Secondary | ICD-10-CM | POA: Diagnosis not present

## 2019-02-26 DIAGNOSIS — I700 Atherosclerosis of aorta: Secondary | ICD-10-CM | POA: Diagnosis not present

## 2019-02-26 DIAGNOSIS — I440 Atrioventricular block, first degree: Secondary | ICD-10-CM | POA: Diagnosis not present

## 2019-02-26 DIAGNOSIS — R5381 Other malaise: Secondary | ICD-10-CM | POA: Diagnosis not present

## 2019-02-27 DIAGNOSIS — R42 Dizziness and giddiness: Secondary | ICD-10-CM | POA: Diagnosis not present

## 2019-02-27 DIAGNOSIS — I6521 Occlusion and stenosis of right carotid artery: Secondary | ICD-10-CM | POA: Diagnosis not present

## 2019-02-28 DIAGNOSIS — I491 Atrial premature depolarization: Secondary | ICD-10-CM | POA: Diagnosis not present

## 2019-02-28 DIAGNOSIS — I440 Atrioventricular block, first degree: Secondary | ICD-10-CM | POA: Diagnosis not present

## 2019-02-28 DIAGNOSIS — I493 Ventricular premature depolarization: Secondary | ICD-10-CM | POA: Diagnosis not present

## 2019-03-01 ENCOUNTER — Telehealth: Payer: Self-pay

## 2019-03-01 MED ORDER — IOHEXOL 350 MG/ML SOLN
100.00 | INTRAVENOUS | Status: DC
Start: ? — End: 2019-03-01

## 2019-03-01 NOTE — Telephone Encounter (Signed)
Thanks for update

## 2019-03-01 NOTE — Telephone Encounter (Signed)
Spoke with patients daughter, [name].  Daughter states patient went to the ER in [facility] on Friday after speaking to triage nurse for vertigo.  Per daughter, was advised vertigo was result of inner ear issue which has resolved.  Offered F/U appt with PCP, declined at this time.     [facility] Night - Client TELEPHONE ADVICE RECORD [facility] Patient Name: Lance Powell Gender: Male DOB: Jun 30, 1929 Age: 86 Y 50 M 26 D Return Phone Number: [phone] (Primary), [phone] (Secondary) Address: City/State/Zip: [place] [address] Client [facility] Night - Client Client Site [facility] - Night Physician [name], [name] - [place] Contact Type Call Who Is Calling Patient / Member / Family / Caregiver Call Type Triage / Clinical Caller Name Daryll Drown Relationship To Patient Daughter Return Phone Number [phone] (Primary) Chief Complaint Medication reaction Reason for Call Symptomatic / Request for Health Information Initial Comment Caller's dad had the covid vaccine on Tuesday. He has vertigo. Nausea. Start Translation No Nurse Assessment Nurse: Cruzita Lederer, RN, Richrd Sox Date/Time (Eastern Time): 02/26/2019 8:12:13 PM Confirm and document reason for call. If symptomatic, describe symptoms. ---Caller states that dad had COVID vaccine on Tuesday. He is having some vertigo and nausea. Has the patient had close contact with a person known or suspected to have the novel coronavirus illness OR traveled / lives in area with major community spread (including international travel) in the last 14 days from the onset of symptoms? * If Asymptomatic, screen for exposure and travel within the last 14 days. ---No Does the patient have any new or worsening symptoms? ---Yes Will a triage be completed? ---Yes Related visit to physician within the last 2 weeks? ---No Does the PT have any  chronic conditions? (i.e. diabetes, asthma, this includes High risk factors for pregnancy, etc.) ---Yes List chronic conditions. ---DM COPD on home O2 Is this a behavioral health or substance abuse call? ---No Guidelines Guideline Title Affirmed Question Affirmed Notes Nurse Date/Time (Eastern Time) Coronavirus (COVID-19) - Vaccine Questions and Reactions COVID-19 vaccine, Frequently Asked Questions (FAQs) Cruzita Lederer, RN, Richrd Sox 02/26/2019 8:14:23 PM Dizziness - Vertigo [1] Dizziness (vertigo) present now AND [2] Cruzita Lederer, RN, Richrd Sox 02/26/2019 8:19:32 PM PLEASE NOTE: All timestamps contained within this report are represented as [place] Standard Time. CONFIDENTIALTY NOTICE: This fax transmission is intended only for the addressee. It contains information that is legally privileged, confidential or otherwise protected from use or disclosure. If you are not the intended recipient, you are strictly prohibited from reviewing, disclosing, copying using or disseminating any of this information or taking any action in reliance on or regarding this information. If you have received this fax in error, please notify us immediately by telephone so that we can arrange for its return to [place]. Phone: [phone], Toll-Free: [phone], Fax: [phone] Page: 2 of 2 Call Id: [id number] Guidelines Guideline Title Affirmed Question Affirmed Notes Nurse Date/Time Eilene Ghazi Time) one or more stroke risk factors (i.e., hypertension, diabetes, prior stroke/ TIA, heart attack) (Exception: prior physician evaluation for this AND no different/ worse than usual) Disp. Time Eilene Ghazi Time) Disposition Final User 02/26/2019 Cave, RN, Richrd Sox 02/26/2019 8:22:36 PM Go to ED Now (or PCP triage) Yes Cruzita Lederer, RN, Albertine Patricia Disagree/Comply Comply Caller Understands Yes PreDisposition Call Doctor Care Advice Given Per Guideline * You should be able to treat this at home. * The most  common side effects are feeling  tired, fever, headache, muscle aches, and pain at the site of the injection. * Side effects are normal. They mean your immune system is working and building antibodies. * Side effects usually last 1 to 3 days. * Side effects may be worse after the second vaccine shot. CALL BACK IF: * You have more questions. CARE ADVICE given per Coronavirus (COVID-19) - Vaccine Questions and Reactions (Adult) guideline. GO TO ED NOW (OR PCP TRIAGE): * Please bring a list of your current medicines when you go to see the doctor. CARE ADVICE given per Dizziness - Vertigo (Adult) guideline. Referrals GO TO FACILITY OTHER - SPECIFY

## 2019-03-17 ENCOUNTER — Other Ambulatory Visit: Payer: Self-pay | Admitting: Medical

## 2019-03-23 ENCOUNTER — Ambulatory Visit: Payer: Medicare Other | Admitting: Medical

## 2019-03-30 ENCOUNTER — Ambulatory Visit: Payer: Medicare Other | Admitting: Medical

## 2019-03-30 ENCOUNTER — Other Ambulatory Visit: Payer: Self-pay

## 2019-03-30 VITALS — BP 122/67 | HR 118 | Temp 96.4°F | Resp 18 | Ht 72.0 in | Wt 251.8 lb

## 2019-03-30 DIAGNOSIS — M25561 Pain in right knee: Secondary | ICD-10-CM | POA: Diagnosis not present

## 2019-03-30 DIAGNOSIS — R21 Rash and other nonspecific skin eruption: Secondary | ICD-10-CM | POA: Diagnosis not present

## 2019-03-30 DIAGNOSIS — E785 Hyperlipidemia, unspecified: Secondary | ICD-10-CM | POA: Diagnosis not present

## 2019-03-30 DIAGNOSIS — Z794 Long term (current) use of insulin: Secondary | ICD-10-CM

## 2019-03-30 DIAGNOSIS — E118 Type 2 diabetes mellitus with unspecified complications: Secondary | ICD-10-CM

## 2019-03-30 MED ORDER — NYSTATIN 100000 UNIT/GM EX CREA: 1.0000 "application " | TOPICAL_CREAM | Freq: Two times a day (BID) | CUTANEOUS | 0 refills | Status: DC

## 2019-03-30 NOTE — Patient Instructions (Signed)
For knee pain mild for 4 weeks will refer you to Sports Med.   For rash in groin area will rx nystatin for likely fungal infection. Keep area dry and sugars controlled.   For diabetes continue current diabetic meds and placed future a1c and cmp. Try to get scheduled on the day in for sports med appointment.  For high cholesterol and diabetes continue statin.  For mild loose stools recommend increase fiber in diet and try probiotics.  For copd continue o2 and inhalers.  Follow up date to be determined after lab review.

## 2019-03-30 NOTE — Progress Notes (Signed)
Subjective:    Patient ID: Lance Powell, male    DOB: May 29, 1929, 86 y.o.   MRN: [id number]  HPI  Pt in for follow up  Pt some rt knee pain. It hurt about 5 days after her had low impact fall forward onto rt knee when dizzy about 02-26-2019. He points to medial tibial plateau area.   Pt states some mild groin pinkish redness for about one month. Pt put some baby powder on that area.  Pt states his sugar levels have range 105-115. On reading was 300 one day but 5 minutes later was 120?  Pt has left eye slight dc each morning for one week. But no eye pain. No vision.  Copd hx . Pt on inhalers and sees pulmonologist.  Mild loose stools.          Review of Systems  Constitutional: Negative for chills, fatigue and fever.  HENT: Negative for dental problem.   Respiratory: Negative for cough, chest tightness, shortness of breath and wheezing.   Cardiovascular: Negative for chest pain and palpitations.  Gastrointestinal: Negative for abdominal pain, blood in stool, constipation and diarrhea.  Genitourinary: Negative for decreased urine volume, dysuria, frequency, penile swelling and testicular pain.  Musculoskeletal: Negative for back pain, gait problem and neck stiffness.  Skin: Negative for rash.  Neurological: Negative for dizziness, syncope, weakness and light-headedness.  Hematological: Negative for adenopathy. Does not bruise/bleed easily.  Psychiatric/Behavioral: Negative for behavioral problems, decreased concentration, dysphoric mood and self-injury. The patient is not nervous/anxious.     Past Medical History:  Diagnosis Date  . 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 COPD (chronic obstructive pulmonary disease) ([place])   . Depression 03/08/2014  . Diabetes mellitus   . GERD (gastroesophageal reflux disease) 03/08/2014  . History of blood transfusion   . Hyperlipemia   . Hypertension   . OSA (obstructive sleep apnea)   . Osteoarthritis   . Rheumatoid arthritis(714.0)     . Sleep apnea    cpap     Social History   Socioeconomic History  . Marital status: Married    Spouse name: Not on file  . Number of children: 6  . Years of education: Not on file  . Highest education level: Not on file  Occupational History  . Occupation: Retired    Comment: Print production planner  . Smoking status: Former Smoker    Packs/day: 4.00    Years: 30.00    Pack years: 120.00    Types: Cigarettes    Quit date: 01/21/1978    Years since quitting: 41.2  . Smokeless tobacco: Never Used  Substance and Sexual Activity  . Alcohol use: No  . Drug use: No  . Sexual activity: Not on file  Other Topics Concern  . Not on file  Social History Narrative  . Not on file   Social Determinants of Health   Financial Resource Strain:   . Difficulty of Paying Living Expenses: Not on file  Food Insecurity:   . Worried About Charity fundraiser in the Last Year: Not on file  . Ran Out of Food in the Last Year: Not on file  Transportation Needs:   . Lack of Transportation (Medical): Not on file  . Lack of Transportation (Non-Medical): Not on file  Physical Activity:   . Days of Exercise per Week: Not on file  . Minutes of Exercise per Session: Not on file  Stress:   . Feeling of Stress : Not on  file  Social Connections:   . Frequency of Communication with Friends and Family: Not on file  . Frequency of Social Gatherings with Friends and Family: Not on file  . Attends Religious Services: Not on file  . Active Member of Clubs or Organizations: Not on file  . Attends Archivist Meetings: Not on file  . Marital Status: Not on file  Intimate Partner Violence:   . Fear of Current or Ex-Partner: Not on file  . Emotionally Abused: Not on file  . Physically Abused: Not on file  . Sexually Abused: Not on file    Past Surgical History:  Procedure Laterality Date  . APPENDECTOMY  1969  . CHOLECYSTECTOMY N/A 06/16/2014   Procedure: LAPAROSCOPIC CHOLECYSTECTOMY;   Surgeon: Greer Pickerel, MD;  Location: [facility];  Service: General;  Laterality: N/A;  . CYSTOSCOPY  01/23/2011   Procedure: CYSTOSCOPY;  Surgeon: Molli Hazard, MD;  [facility];  Service: Urology;  Laterality: N/A;  . FLEXIBLE SIGMOIDOSCOPY  01/07/2012   Procedure: FLEXIBLE SIGMOIDOSCOPY;  Surgeon: Ladene Artist, MD,FACG;  Location: [facility];  Service: Endoscopy;  Laterality: N/A;  . HERNIA REPAIR  1990   right and left inguinal  . NISSEN FUNDOPLICATION    . 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 TONSILLECTOMY  1937  . TRANSURETHRAL RESECTION OF PROSTATE  01/23/2011   Procedure: TRANSURETHRAL RESECTION OF THE PROSTATE WITH GYRUS INSTRUMENTS;  Surgeon: Molli Hazard, MD;  Location: [facility];  Service: Urology;  Laterality: N/A;  . VASECTOMY  1972    Family History  Problem Relation Age of Onset  . Heart disease Mother   . Emphysema Brother   . Cancer Daughter        breast    No Known Allergies  Current Outpatient Medications on File Prior to Visit  Medication Sig Dispense Refill  . albuterol (VENTOLIN HFA) 108 (90 Base) MCG/ACT inhaler Inhale 2 puffs into the lungs every 6 (six) hours as needed for wheezing or shortness of breath. 8 g 2  . aspirin 81 MG tablet Take 1 tablet (81 mg total) by mouth every morning. Stop for 1 week and then resume (Patient taking differently: Take 81 mg by mouth every morning. )    . atorvastatin (LIPITOR) 40 MG tablet TAKE 1 TABLET BY MOUTH ONCE DAILY IN THE MORNING 90 tablet 0  . celecoxib (CELEBREX) 200 MG capsule TAKE 1 CAPSULE BY MOUTH ONCE DAILY IN THE EVENING 30 capsule 2  . cholecalciferol (VITAMIN D) 1000 UNITS tablet Take 2,000 Units by mouth 2 (two) times daily.     . diclofenac sodium (VOLTAREN) 1 % GEL Apply 2 g topically 4 (four) times daily. 50 g 0  . enalapril (VASOTEC) 2.5 MG tablet Take 2.5 mg by mouth every morning. Reported on 07/12/2015    . glucose blood (BAYER CONTOUR  TEST) test strip Use as instructed to check blood sugar twice a day.  DX  E11.9 100 each 5  . Indacaterol Maleate (ARCAPTA NEOHALER) 75 MCG CAPS Place into inhaler and inhale.    . meclizine (ANTIVERT) 12.5 MG tablet Take 1 tablet (12.5 mg total) by mouth 3 (three) times daily as needed for dizziness. 30 tablet 0  . metFORMIN (GLUCOPHAGE) 500 MG tablet TAKE 1 TABLET BY MOUTH THREE TIMES DAILY 90 tablet 0  . misoprostol (CYTOTEC) 100 MCG tablet TAKE 1 TABLET BY MOUTH TWICE DAILY (IN THE MORNING AND EVENING) AND 2 AT BEDTIME  120 tablet 3  . Multiple Vitamins-Minerals (CENTRUM) tablet Take 1 tablet by mouth every morning. Reported on 01/05/2015    . OVER THE COUNTER MEDICATION Place 1-2 drops into both eyes daily as needed (dry red eyes.). Reported on 01/05/2015    . pioglitazone (ACTOS) 15 MG tablet Take 1 tablet by mouth once daily 90 tablet 0  . Tiotropium Bromide-Olodaterol (STIOLTO RESPIMAT) 2.5-2.5 MCG/ACT AERS Inhale 2 puffs into the lungs daily. 1 g 0  . Tiotropium Bromide-Olodaterol (STIOLTO RESPIMAT) 2.5-2.5 MCG/ACT AERS Inhale 2 puffs into the lungs daily. 4 g 6  . UNABLE TO FIND CPAP    . venlafaxine XR (EFFEXOR-XR) 37.5 MG 24 hr capsule TAKE 1 CAPSULE BY MOUTH ONCE DAILY WITH BREAKFAST 90 capsule 0  . vitamin C (ASCORBIC ACID) 500 MG tablet Take 500 mg by mouth every morning.      No current facility-administered medications on file prior to visit.    BP 122/67 (BP Location: Left Arm, Patient Position: Sitting, Cuff Size: Large)   Pulse (!) 118   Temp (!) 96.4 F (35.8 C) (Temporal)   Resp 18   Ht 6' (1.829 m)   Wt 251 lb 12.8 oz (114.2 kg)   SpO2 93%   BMI 34.15 kg/m       Objective:   Physical Exam   General Mental Status- Alert. General Appearance- Not in acute distress.   Skin General: Color- Normal Color. Moisture- Normal Moisture.  Neck Carotid Arteries- Normal color. Moisture- Normal Moisture. No carotid bruits. No JVD.  Chest and Lung  Exam Auscultation: Breath Sounds:-Normal.  Cardiovascular Auscultation:Rythm- Regular. Murmurs & Other Heart Sounds:Auscultation of the heart reveals- No Murmurs.  Abdomen Inspection:-Inspeection Normal. Palpation/Percussion:Note:No mass. Palpation and Percussion of the abdomen reveal- Non Tender, Non Distended + BS, no rebound or guarding.   Neurologic Cranial Nerve exam:- CN III-XII intact(No nystagmus), symmetric smile. Strength:- 5/5 equal and symmetric strength both upper and lower extremities.      Assessment & Plan:  For knee pain mild for 4 weeks will refer you to Sports Med.   For rash in groin area will rx nystatin for likely fungal infection. Keep area dry and sugars controlled.   For diabetes continue current diabetic meds and placed future a1c and cmp. Try to get scheduled on the day in for sports med appointment.  For high cholesterol and diabetes continue statin.  For mild loose stools recommend increase fiber in diet and try probiotics.  For copd continue o2 and inhalers.  Follow up date to be determined after lab review.   30 minutes spent with pt today. [name], PA-C

## 2019-04-01 ENCOUNTER — Other Ambulatory Visit: Payer: Self-pay | Admitting: Medical

## 2019-04-04 ENCOUNTER — Other Ambulatory Visit: Payer: Self-pay | Admitting: Medical

## 2019-04-06 ENCOUNTER — Other Ambulatory Visit: Payer: Self-pay | Admitting: Medical

## 2019-04-08 ENCOUNTER — Ambulatory Visit (HOSPITAL_BASED_OUTPATIENT_CLINIC_OR_DEPARTMENT_OTHER)
Admission: RE | Admit: 2019-04-08 | Discharge: 2019-04-08 | Disposition: A | Discharge status: Home / Self Care | Payer: Medicare Other | Source: Ambulatory Visit | Attending: Family Medicine | Admitting: Family Medicine

## 2019-04-08 ENCOUNTER — Ambulatory Visit: Payer: Medicare Other | Admitting: Family Medicine

## 2019-04-08 ENCOUNTER — Other Ambulatory Visit (INDEPENDENT_AMBULATORY_CARE_PROVIDER_SITE_OTHER): Payer: Medicare Other

## 2019-04-08 ENCOUNTER — Encounter: Payer: Self-pay | Admitting: Family Medicine

## 2019-04-08 ENCOUNTER — Other Ambulatory Visit: Payer: Self-pay

## 2019-04-08 ENCOUNTER — Ambulatory Visit: Payer: Self-pay

## 2019-04-08 VITALS — BP 125/81 | Ht 72.0 in | Wt 250.0 lb

## 2019-04-08 DIAGNOSIS — Z794 Long term (current) use of insulin: Secondary | ICD-10-CM | POA: Diagnosis not present

## 2019-04-08 DIAGNOSIS — M1711 Unilateral primary osteoarthritis, right knee: Secondary | ICD-10-CM

## 2019-04-08 DIAGNOSIS — S83411A Sprain of medial collateral ligament of right knee, initial encounter: Secondary | ICD-10-CM

## 2019-04-08 DIAGNOSIS — M7041 Prepatellar bursitis, right knee: Secondary | ICD-10-CM | POA: Diagnosis not present

## 2019-04-08 DIAGNOSIS — E118 Type 2 diabetes mellitus with unspecified complications: Secondary | ICD-10-CM

## 2019-04-08 DIAGNOSIS — M25561 Pain in right knee: Secondary | ICD-10-CM | POA: Diagnosis not present

## 2019-04-08 NOTE — Progress Notes (Signed)
Lance Powell - 86 y.o. male MRN [id number]  Date of birth: [birth year]  SUBJECTIVE:  Including CC & ROS.  Chief Complaint  Patient presents with  . Knee Pain    right knee x 3 weeks    Lance Powell is a 86 y.o. male that is presenting with acute right knee pain.  The pain has been occurring for a few weeks after he had a twisting injury to his knee.  He feels the pain in the medial aspect.  He is limited with his mobility and uses a rolling walker to help.  The pain is worse with transitioning from side sleeping to his back at night.  Does not have any pain if he is sitting still.  It is localized to the knee.  No history of prior pain.    Review of Systems See HPI   HISTORY: Past Medical, Surgical, Social, and Family History Reviewed & Updated per EMR.   Pertinent Historical Findings include:  Past Medical History:  Diagnosis Date  . 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 COPD (chronic obstructive pulmonary disease) ([place])   . Depression 03/08/2014  . Diabetes mellitus   . GERD (gastroesophageal reflux disease) 03/08/2014  . History of blood transfusion   . Hyperlipemia   . Hypertension   . OSA (obstructive sleep apnea)   . Osteoarthritis   . Rheumatoid arthritis(714.0)   . Sleep apnea    cpap    Past Surgical History:  Procedure Laterality Date  . APPENDECTOMY  1969  . CHOLECYSTECTOMY N/A 06/16/2014   Procedure: LAPAROSCOPIC CHOLECYSTECTOMY;  Surgeon: Greer Pickerel, MD;  Location: [facility];  Service: General;  Laterality: N/A;  . CYSTOSCOPY  01/23/2011   Procedure: CYSTOSCOPY;  Surgeon: Molli Hazard, MD;  Location: [facility];  Service: Urology;  Laterality: N/A;  . FLEXIBLE SIGMOIDOSCOPY  01/07/2012   Procedure: FLEXIBLE SIGMOIDOSCOPY;  Surgeon: Ladene Artist, MD,FACG;  Location[facility];  Service: Endoscopy;  Laterality: N/A;  . HERNIA REPAIR  1990   right and left inguinal  . NISSEN FUNDOPLICATION    . SIGMOIDECTOMY  2004   colovesical  fistula  . TONSILLECTOMY  1937  . TRANSURETHRAL RESECTION OF PROSTATE  01/23/2011   Procedure: TRANSURETHRAL RESECTION OF THE PROSTATE WITH GYRUS INSTRUMENTS;  Surgeon: Molli Hazard, MD;  Location: [facility];  Service: Urology;  Laterality: N/A;  . VASECTOMY  1972    Family History  Problem Relation Age of Onset  . Heart disease Mother   . Emphysema Brother   . Cancer Daughter        breast    Social History   Socioeconomic History  . Marital status: Married    Spouse name: Not on file  . Number of children: 6  . Years of education: Not on file  . Highest education level: Not on file  Occupational History  . Occupation: Retired    Comment: Print production planner  . Smoking status: Former Smoker    Packs/day: 4.00    Years: 30.00    Pack years: 120.00    Types: Cigarettes    Quit date: 01/21/1978    Years since quitting: 41.2  . Smokeless tobacco: Never Used  Substance and Sexual Activity  . Alcohol use: No  . Drug use: No  . Sexual activity: Not on file  Other Topics Concern  . Not on file  Social History Narrative  . Not on file   Social Determinants of Health  Financial Resource Strain:   . Difficulty of Paying Living Expenses:   Food Insecurity:   . Worried About Charity fundraiser in the Last Year:   . Arboriculturist in the Last Year:   Transportation Needs:   . Film/video editor (Medical):   Marland Kitchen Lack of Transportation (Non-Medical):   Physical Activity:   . Days of Exercise per Week:   . Minutes of Exercise per Session:   Stress:   . Feeling of Stress :   Social Connections:   . Frequency of Communication with Friends and Family:   . Frequency of Social Gatherings with Friends and Family:   . Attends Religious Services:   . Active Member of Clubs or Organizations:   . Attends Archivist Meetings:   Marland Kitchen Marital Status:   Intimate Partner Violence:   . Fear of Current or Ex-Partner:   . Emotionally Abused:   Marland Kitchen  Physically Abused:   . Sexually Abused:      PHYSICAL EXAM:  VS: BP 125/81   Ht 6' (1.829 m)   Wt 250 lb (113.4 kg)   BMI 33.91 kg/m  Physical Exam Gen: NAD, alert, cooperative with exam, well-appearing MSK:  Right knee: Tenderness palpation over the medial aspect of the joint. Normal flexion and extension. No obvious effusion. Instability with valgus stress. Neurovascularly intact  Limited ultrasound: Right knee:  No effusion in the suprapatellar pouch. Hypoechoic change superficial to the patella and patellar tendon to suggest a prepatellar bursitis. Medial joint space with mild narrowing but does have outpouching of the medial meniscus. Significant hyperemia and hypoechoic change of the origin of the MCL to suggest a sprain. Normal-appearing lateral joint line and meniscus  Summary: MCL sprain observed.  Prepatellar bursitis.  Mild degenerative changes of the medial meniscus.  Ultrasound and interpretation by Clearance Coots, MD    ASSESSMENT & PLAN:   Sprain of medial collateral ligament of right knee Seems like the majority of his pain is associated with the changes at the origin of the MCL.  Appears to have a significant sprain on ultrasound.  No effusion to suggest any intra-articular process. -X-ray. -Hinged knee brace. -Counseled on home exercise therapy and supportive care. -Provided Pennsaid samples. -Referral to physical therapy. -Could consider injection if needed.  Prepatellar bursitis of right knee Observed on ultrasound today. -Counseled on supportive care.

## 2019-04-08 NOTE — Assessment & Plan Note (Signed)
Seems like the majority of his pain is associated with the changes at the origin of the MCL.  Appears to have a significant sprain on ultrasound.  No effusion to suggest any intra-articular process. -X-ray. -Hinged knee brace. -Counseled on home exercise therapy and supportive care. -Provided Pennsaid samples. -Referral to physical therapy. -Could consider injection if needed.

## 2019-04-08 NOTE — Assessment & Plan Note (Signed)
Observed on ultrasound today. -Counseled on supportive care.

## 2019-04-08 NOTE — Progress Notes (Signed)
Medication Samples have been provided to the patient.  Drug name: Pennsaid       Strength: 2%        Qty: 2 Boxes  [id number]  Exp.Date: 11/2019  Dosing instructions: Use a pea size amount and rub gently.  The patient has been instructed regarding the correct time, dose, and frequency of taking this medication, including desired effects and most common side effects.   Sherrie George, [place] 4:32 PM 04/08/2019

## 2019-04-08 NOTE — Patient Instructions (Signed)
Nice to meet you Please try ice  Please try the brace Please try the pennsaid  Physical therapy will give you a call  I will call with the xray results from today   Please send me a message in [place] with any questions or updates.  Please see me back in 4 weeks.   --Dr. Raeford Razor

## 2019-04-09 ENCOUNTER — Telehealth: Payer: Self-pay | Admitting: Family Medicine

## 2019-04-09 LAB — COMPREHENSIVE METABOLIC PANEL
ALT: 23 U/L (ref 0–53)
AST: 18 U/L (ref 0–37)
Albumin: 3.9 g/dL (ref 3.5–5.2)
Alkaline Phosphatase: 77 U/L (ref 39–117)
BUN: 15 mg/dL (ref 6–23)
CO2: 31 mEq/L (ref 19–32)
Calcium: 10.4 mg/dL (ref 8.4–10.5)
Chloride: 103 mEq/L (ref 96–112)
Creatinine, Ser: 1.17 mg/dL (ref 0.40–1.50)
GFR: 58.9 mL/min — ABNORMAL LOW (ref >60.00)
Glucose, Bld: 92 mg/dL (ref 70–99)
Potassium: 4.5 mEq/L (ref 3.5–5.1)
Sodium: 140 mEq/L (ref 135–145)
Total Bilirubin: 0.5 mg/dL (ref 0.2–1.2)
Total Protein: 6.5 g/dL (ref 6.0–8.3)

## 2019-04-09 LAB — HEMOGLOBIN A1C: Hgb A1c MFr Bld: 6.8 % — ABNORMAL HIGH (ref 4.6–6.5)

## 2019-04-09 NOTE — Telephone Encounter (Signed)
Left VM for patient. If he calls back please have him speak with a nurse/CMA and inform that his xray shows mild degenerative changes and no fracture. We will continue with the plan we discussed.   If any questions then please take the best time and phone number to call and I will try to call him back.   Rosemarie Ax, MD [facility] 04/09/2019, 9:08 AM

## 2019-04-12 ENCOUNTER — Telehealth: Payer: Self-pay | Admitting: Family Medicine

## 2019-04-12 NOTE — Telephone Encounter (Signed)
Patient returning call for results 

## 2019-04-12 NOTE — Addendum Note (Signed)
Addended by: Sherrie George F on: 04/12/2019 09:52 AM   Modules accepted: Orders

## 2019-04-12 NOTE — Telephone Encounter (Signed)
Spoke to patient and gave result information as provided by physician. 

## 2019-04-13 DIAGNOSIS — F329 Major depressive disorder, single episode, unspecified: Secondary | ICD-10-CM | POA: Diagnosis not present

## 2019-04-13 DIAGNOSIS — M069 Rheumatoid arthritis, unspecified: Secondary | ICD-10-CM | POA: Diagnosis not present

## 2019-04-13 DIAGNOSIS — K219 Gastro-esophageal reflux disease without esophagitis: Secondary | ICD-10-CM | POA: Diagnosis not present

## 2019-04-13 DIAGNOSIS — M7041 Prepatellar bursitis, right knee: Secondary | ICD-10-CM | POA: Diagnosis not present

## 2019-04-13 DIAGNOSIS — N312 Flaccid neuropathic bladder, not elsewhere classified: Secondary | ICD-10-CM | POA: Diagnosis not present

## 2019-04-13 DIAGNOSIS — Z9181 History of falling: Secondary | ICD-10-CM | POA: Diagnosis not present

## 2019-04-13 DIAGNOSIS — G4733 Obstructive sleep apnea (adult) (pediatric): Secondary | ICD-10-CM | POA: Diagnosis not present

## 2019-04-13 DIAGNOSIS — Z7982 Long term (current) use of aspirin: Secondary | ICD-10-CM | POA: Diagnosis not present

## 2019-04-13 DIAGNOSIS — E119 Type 2 diabetes mellitus without complications: Secondary | ICD-10-CM | POA: Diagnosis not present

## 2019-04-13 DIAGNOSIS — E785 Hyperlipidemia, unspecified: Secondary | ICD-10-CM | POA: Diagnosis not present

## 2019-04-13 DIAGNOSIS — Z7984 Long term (current) use of oral hypoglycemic drugs: Secondary | ICD-10-CM | POA: Diagnosis not present

## 2019-04-13 DIAGNOSIS — S83411D Sprain of medial collateral ligament of right knee, subsequent encounter: Secondary | ICD-10-CM | POA: Diagnosis not present

## 2019-04-13 DIAGNOSIS — Z9981 Dependence on supplemental oxygen: Secondary | ICD-10-CM | POA: Diagnosis not present

## 2019-04-13 DIAGNOSIS — M1611 Unilateral primary osteoarthritis, right hip: Secondary | ICD-10-CM | POA: Diagnosis not present

## 2019-04-13 DIAGNOSIS — I10 Essential (primary) hypertension: Secondary | ICD-10-CM | POA: Diagnosis not present

## 2019-04-13 DIAGNOSIS — J449 Chronic obstructive pulmonary disease, unspecified: Secondary | ICD-10-CM | POA: Diagnosis not present

## 2019-04-21 ENCOUNTER — Other Ambulatory Visit: Payer: Self-pay | Admitting: Medical

## 2019-04-23 ENCOUNTER — Other Ambulatory Visit: Payer: Self-pay | Admitting: Medical

## 2019-05-05 ENCOUNTER — Other Ambulatory Visit: Payer: Self-pay

## 2019-05-05 ENCOUNTER — Encounter: Payer: Self-pay | Admitting: Family Medicine

## 2019-05-05 ENCOUNTER — Ambulatory Visit: Payer: Medicare Other | Admitting: Family Medicine

## 2019-05-05 DIAGNOSIS — S83411D Sprain of medial collateral ligament of right knee, subsequent encounter: Secondary | ICD-10-CM | POA: Diagnosis not present

## 2019-05-05 NOTE — Patient Instructions (Signed)
Good to see you Please continue the brace Please use ice as needed   Please send me a message in MyChart with any questions or updates.  Please see [place] back as needed.   --Dr. Raeford Razor

## 2019-05-05 NOTE — Progress Notes (Signed)
Lance Powell - 86 y.o. male MRN [id number]  Date of birth: 10-03-1929  SUBJECTIVE:  Including CC & ROS.  Chief Complaint  Patient presents with  . Follow-up    follow up for right knee    Lance Powell is a 86 y.o. male that is following up for his right knee pain.  He was diagnosed with MCL sprain.  Has been using a brace and having home health physical therapy.  Doing well since his initial injury.  Independent review of his right knee x-ray from 3/19 shows degenerative changes in the medial compartment with no fracture.   Review of Systems See HPI   HISTORY: Past Medical, Surgical, Social, and Family History Reviewed & Updated per EMR.   Pertinent Historical Findings include:  Past Medical History:  Diagnosis Date  . 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 COPD (chronic obstructive pulmonary disease) ([place])   . Depression 03/08/2014  . Diabetes mellitus   . GERD (gastroesophageal reflux disease) 03/08/2014  . History of blood transfusion   . Hyperlipemia   . Hypertension   . OSA (obstructive sleep apnea)   . Osteoarthritis   . Rheumatoid arthritis(714.0)   . Sleep apnea    cpap    Past Surgical History:  Procedure Laterality Date  . APPENDECTOMY  1969  . CHOLECYSTECTOMY N/A 06/16/2014   Procedure: LAPAROSCOPIC CHOLECYSTECTOMY;  Surgeon: Greer Pickerel, MD;  Location: [facility];  Service: General;  Laterality: N/A;  . CYSTOSCOPY  01/23/2011   Procedure: CYSTOSCOPY;  Surgeon: Molli Hazard, MD;  Location: [facility];  Service: Urology;  Laterality: N/A;  . FLEXIBLE SIGMOIDOSCOPY  01/07/2012   Procedure: FLEXIBLE SIGMOIDOSCOPY;  Surgeon: Ladene Artist, MD,FACG;  [facility];  Service: Endoscopy;  Laterality: N/A;  . HERNIA REPAIR  1990   right and left inguinal  . NISSEN FUNDOPLICATION    . 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 TONSILLECTOMY  1937  . TRANSURETHRAL RESECTION OF PROSTATE  01/23/2011   Procedure: TRANSURETHRAL RESECTION  OF THE PROSTATE WITH GYRUS INSTRUMENTS;  Surgeon: Molli Hazard, MD;  Location: [facility];  Service: Urology;  Laterality: N/A;  . VASECTOMY  1972    Family History  Problem Relation Age of Onset  . Heart disease Mother   . Emphysema Brother   . Cancer Daughter        breast    Social History   Socioeconomic History  . Marital status: Married    Spouse name: Not on file  . Number of children: 6  . Years of education: Not on file  . Highest education level: Not on file  Occupational History  . Occupation: Retired    Comment: Print production planner  . Smoking status: Former Smoker    Packs/day: 4.00    Years: 30.00    Pack years: 120.00    Types: Cigarettes    Quit date: 01/21/1978    Years since quitting: 41.3  . Smokeless tobacco: Never Used  Substance and Sexual Activity  . Alcohol use: No  . Drug use: No  . Sexual activity: Not on file  Other Topics Concern  . Not on file  Social History Narrative  . Not on file   Social Determinants of Health   Financial Resource Strain:   . Difficulty of Paying Living Expenses:   Food Insecurity:   . Worried About Charity fundraiser in the Last Year:   . [employer] of  Food in the Last Year:   Transportation Needs:   . Film/video editor (Medical):   Marland Kitchen Lack of Transportation (Non-Medical):   Physical Activity:   . Days of Exercise per Week:   . Minutes of Exercise per Session:   Stress:   . Feeling of Stress :   Social Connections:   . Frequency of Communication with Friends and Family:   . Frequency of Social Gatherings with Friends and Family:   . Attends Religious Services:   . Active Member of Clubs or Organizations:   . Attends Archivist Meetings:   Marland Kitchen Marital Status:   Intimate Partner Violence:   . Fear of Current or Ex-Partner:   . Emotionally Abused:   Marland Kitchen Physically Abused:   . Sexually Abused:      PHYSICAL EXAM:  VS: BP 131/85   Ht 6' (1.829 m)   Wt 250 lb (113.4  kg)   BMI 33.91 kg/m  Physical Exam Gen: NAD, alert, cooperative with exam, well-appearing MSK:  Right knee: No effusion. Tenderness palpation over the origin of the MCL Normal flexion extension. Neurovascularly intact     ASSESSMENT & PLAN:   Sprain of medial collateral ligament of right knee Doing well since his physical therapy started and using a brace. -Counseled on home exercise therapy and supportive care. -Continue to brace. -Could consider injection if needed. -Follow-up as needed.

## 2019-05-06 NOTE — Assessment & Plan Note (Signed)
Doing well since his physical therapy started and using a brace. -Counseled on home exercise therapy and supportive care. -Continue to brace. -Could consider injection if needed. -Follow-up as needed.

## 2019-05-10 ENCOUNTER — Telehealth: Payer: Self-pay | Admitting: Medical

## 2019-05-10 DIAGNOSIS — R0902 Hypoxemia: Secondary | ICD-10-CM | POA: Diagnosis not present

## 2019-05-10 DIAGNOSIS — R05 Cough: Secondary | ICD-10-CM | POA: Diagnosis not present

## 2019-05-10 DIAGNOSIS — R000 Tachycardia, unspecified: Secondary | ICD-10-CM | POA: Diagnosis not present

## 2019-05-10 DIAGNOSIS — I440 Atrioventricular block, first degree: Secondary | ICD-10-CM | POA: Diagnosis not present

## 2019-05-10 DIAGNOSIS — R0609 Other forms of dyspnea: Secondary | ICD-10-CM | POA: Diagnosis not present

## 2019-05-10 DIAGNOSIS — J9811 Atelectasis: Secondary | ICD-10-CM | POA: Diagnosis not present

## 2019-05-10 DIAGNOSIS — J984 Other disorders of lung: Secondary | ICD-10-CM | POA: Diagnosis not present

## 2019-05-10 DIAGNOSIS — R0602 Shortness of breath: Secondary | ICD-10-CM | POA: Diagnosis not present

## 2019-05-10 DIAGNOSIS — R918 Other nonspecific abnormal finding of lung field: Secondary | ICD-10-CM | POA: Diagnosis not present

## 2019-05-10 DIAGNOSIS — J439 Emphysema, unspecified: Secondary | ICD-10-CM | POA: Diagnosis not present

## 2019-05-10 DIAGNOSIS — I700 Atherosclerosis of aorta: Secondary | ICD-10-CM | POA: Diagnosis not present

## 2019-05-10 DIAGNOSIS — Z20822 Contact with and (suspected) exposure to covid-19: Secondary | ICD-10-CM | POA: Diagnosis not present

## 2019-05-10 NOTE — Telephone Encounter (Signed)
[name] from [facility] stated Lance Powell went to the ER around 4:15

## 2019-05-10 NOTE — Telephone Encounter (Signed)
Caller : [facility]  Call Back # [phone]   Patient oxygen is 86, with supplement oxygen.Patient is dizziness upon standing.   Please Advise

## 2019-05-10 NOTE — Telephone Encounter (Signed)
Pt is on oxygen but I do think usually higher/above 90's. So have to recommend ED. Needs labs and chest xray. Better to go now.

## 2019-05-12 ENCOUNTER — Other Ambulatory Visit: Payer: Self-pay

## 2019-05-12 ENCOUNTER — Encounter: Payer: Self-pay | Admitting: Medical

## 2019-05-12 ENCOUNTER — Ambulatory Visit: Payer: Medicare Other | Admitting: Medical

## 2019-05-12 VITALS — BP 122/62 | HR 116 | Temp 96.5°F | Resp 16 | Ht 72.0 in | Wt 255.6 lb

## 2019-05-12 DIAGNOSIS — J438 Other emphysema: Secondary | ICD-10-CM

## 2019-05-12 DIAGNOSIS — Z794 Long term (current) use of insulin: Secondary | ICD-10-CM | POA: Diagnosis not present

## 2019-05-12 DIAGNOSIS — R350 Frequency of micturition: Secondary | ICD-10-CM

## 2019-05-12 DIAGNOSIS — E118 Type 2 diabetes mellitus with unspecified complications: Secondary | ICD-10-CM

## 2019-05-12 DIAGNOSIS — R944 Abnormal results of kidney function studies: Secondary | ICD-10-CM

## 2019-05-12 LAB — POC URINALSYSI DIPSTICK (AUTOMATED)
Bilirubin, UA: NEGATIVE
Blood, UA: NEGATIVE
Glucose, UA: NEGATIVE
Ketones, UA: NEGATIVE
Leukocytes, UA: NEGATIVE
Nitrite, UA: NEGATIVE
Protein, UA: NEGATIVE
Spec Grav, UA: 1.025 (ref 1.010–1.025)
Urobilinogen, UA: 0.2 E.U./dL
pH, UA: 6 (ref 5.0–8.0)

## 2019-05-12 NOTE — Patient Instructions (Addendum)
You appear clinically stable with good 02 sat today. Recommend use your stiolto inhaler once a day. We are also sending rx of neb machine with albuterol solution to your pharmacy. Continue to use o2 daily.  We are getting cbc, cmp, psa and urine today.  We discussed atelectasis on your imaging studies. Can do incentive spirometer as you have that at your home. Video showed. If you get a lot of productive cough, fever or chills let us know.  Referral for copd placed. Call pulmonologist office they can see the referral.  Stay hydrated. Follow gfr today.  Follow up date to be determined after lab review or as needed

## 2019-05-12 NOTE — Progress Notes (Signed)
Subjective:    Patient ID: Lance Powell, male    DOB: 06/11/1929, 86 y.o.   MRN: [id number]  HPI   Pt in for follow up.  He was in the ED recently at [facility].  Lance Powell is a 86 y.o. male with PMHx of DM, COPD presents to the ED with complaint of SHOB, abnormal oxygen measurements at home. Patient states his PT said to go to ED after she took his O2 sats around 86-88%. He is normally at 92-93% and uses O2 at home. He endorses a cough productive of white sputum at baseline. He also complains secondarily of swelling in both legs "over the past several weeks." Daughter reports that he told his other child recently that his "heart hurts." She also reports he is not using his Stiolto inhaler as directed, but he is using his albuterol PRN.  He denies fever, any current chest pain, any urinary symptoms. He got his second COVID vaccine shot two weeks ago  He denies fever, any current chest pain, any urinary symptoms. He got his second COVID vaccine shot two weeks ago.   His vitals in ED were stable They put him on his oxygen and his )2 sat was 92-93%.   Pt work up in ED. LABS Recent Results (from the past 72 hour(s))  Troponin I  Result Value Ref Range  Troponin I 11 <18 pg/mL  COVID-19 and Influenza A/B PCR  Specimen: Nasal; Nasopharyngeal Swab  Result Value Ref Range  SARS-COV-2 Negative Negative  COVID-19 FLUA Negative Negative  COVID-19 FLUB Negative Negative  COVID-19 FLUAB COMMENT   No elevated wbc and no anemia. Gfr- was 59.   No pe on ct or cxr. Some atelectasis. Radiologist did not think pneumonia. Occasional productive cough.  He was not using his stiolto.   He does not have neb machine.  Pt had both covid shots.    Review of Systems  Constitutional: Negative for chills, diaphoresis and fatigue.  Respiratory: Negative for cough and chest tightness.   Gastrointestinal: Negative for abdominal pain, blood in stool, constipation, diarrhea,  nausea and vomiting.  Musculoskeletal: Negative for back pain, myalgias and neck pain.  Skin: Negative for rash.  Neurological: Negative for facial asymmetry, light-headedness and numbness.  Hematological: Negative for adenopathy. Does not bruise/bleed easily.  Psychiatric/Behavioral: Negative for behavioral problems and confusion.    Past Medical History:  Diagnosis Date  . 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 COPD (chronic obstructive pulmonary disease) ([place])   . Depression 03/08/2014  . Diabetes mellitus   . GERD (gastroesophageal reflux disease) 03/08/2014  . History of blood transfusion   . Hyperlipemia   . Hypertension   . OSA (obstructive sleep apnea)   . Osteoarthritis   . Rheumatoid arthritis(714.0)   . Sleep apnea    cpap     Social History   Socioeconomic History  . Marital status: Married    Spouse name: Not on file  . Number of children: 6  . Years of education: Not on file  . Highest education level: Not on file  Occupational History  . Occupation: Retired    Comment: Print production planner  . Smoking status: Former Smoker    Packs/day: 4.00    Years: 30.00    Pack years: 120.00    Types: Cigarettes    Quit date: 01/21/1978    Years since quitting: 41.3  . Smokeless tobacco: Never Used  Substance and Sexual Activity  . Alcohol use:  No  . Drug use: No  . Sexual activity: Not on file  Other Topics Concern  . Not on file  Social History Narrative  . Not on file   Social Determinants of Health   Financial Resource Strain:   . Difficulty of Paying Living Expenses:   Food Insecurity:   . Worried About Charity fundraiser in the Last Year:   . Arboriculturist in the Last Year:   Transportation Needs:   . Film/video editor (Medical):   Marland Kitchen Lack of Transportation (Non-Medical):   Physical Activity:   . Days of Exercise per Week:   . Minutes of Exercise per Session:   Stress:   . Feeling of Stress :   Social Connections:   . Frequency of Communication  with Friends and Family:   . Frequency of Social Gatherings with Friends and Family:   . Attends Religious Services:   . Active Member of Clubs or Organizations:   . Attends Archivist Meetings:   Marland Kitchen Marital Status:   Intimate Partner Violence:   . Fear of Current or Ex-Partner:   . Emotionally Abused:   Marland Kitchen Physically Abused:   . Sexually Abused:     Past Surgical History:  Procedure Laterality Date  . APPENDECTOMY  1969  . CHOLECYSTECTOMY N/A 06/16/2014   Procedure: LAPAROSCOPIC CHOLECYSTECTOMY;  Surgeon: Greer Pickerel, MD;  Location: [facility];  Service: General;  Laterality: N/A;  . CYSTOSCOPY  01/23/2011   Procedure: CYSTOSCOPY;  Surgeon: Molli Hazard, MD;  Location: [facility];  Service: Urology;  Laterality: N/A;  . FLEXIBLE SIGMOIDOSCOPY  01/07/2012   Procedure: FLEXIBLE SIGMOIDOSCOPY;  Surgeon: Ladene Artist, MD,FACG;  Location: [facility];  Service: Endoscopy;  Laterality: N/A;  . HERNIA REPAIR  1990   right and left inguinal  . NISSEN FUNDOPLICATION    . 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 TONSILLECTOMY  1937  . TRANSURETHRAL RESECTION OF PROSTATE  01/23/2011   Procedure: TRANSURETHRAL RESECTION OF THE PROSTATE WITH GYRUS INSTRUMENTS;  Surgeon: Molli Hazard, MD;  Location: [facility];  Service: Urology;  Laterality: N/A;  . VASECTOMY  1972    Family History  Problem Relation Age of Onset  . Heart disease Mother   . Emphysema Brother   . Cancer Daughter        breast    No Known Allergies  Current Outpatient Medications on File Prior to Visit  Medication Sig Dispense Refill  . albuterol (VENTOLIN HFA) 108 (90 Base) MCG/ACT inhaler Inhale 2 puffs into the lungs every 6 (six) hours as needed for wheezing or shortness of breath. 8 g 2  . aspirin 81 MG tablet Take 1 tablet (81 mg total) by mouth every morning. Stop for 1 week and then resume (Patient taking differently: Take 81 mg by mouth every morning. )     . atorvastatin (LIPITOR) 40 MG tablet TAKE 1 TABLET BY MOUTH ONCE DAILY IN THE MORNING 90 tablet 0  . celecoxib (CELEBREX) 200 MG capsule TAKE 1 CAPSULE BY MOUTH ONCE DAILY IN THE EVENING 30 capsule 0  . cholecalciferol (VITAMIN D) 1000 UNITS tablet Take 2,000 Units by mouth 2 (two) times daily.     . diclofenac sodium (VOLTAREN) 1 % GEL Apply 2 g topically 4 (four) times daily. 50 g 0  . enalapril (VASOTEC) 2.5 MG tablet Take 2.5 mg by mouth every morning. Reported on 07/12/2015    . glucose  blood (BAYER CONTOUR TEST) test strip Use as instructed to check blood sugar twice a day.  DX  E11.9 100 each 5  . Indacaterol Maleate (ARCAPTA NEOHALER) 75 MCG CAPS Place into inhaler and inhale.    . meclizine (ANTIVERT) 12.5 MG tablet Take 1 tablet (12.5 mg total) by mouth 3 (three) times daily as needed for dizziness. 30 tablet 0  . metFORMIN (GLUCOPHAGE) 500 MG tablet TAKE 1 TABLET BY MOUTH THREE TIMES DAILY 90 tablet 0  . misoprostol (CYTOTEC) 100 MCG tablet TAKE 1 TABLET BY MOUTH TWICE DAILY (AM&PM) AND TAKE 2 TABLETS AT BEDTIME 120 tablet 0  . Multiple Vitamins-Minerals (CENTRUM) tablet Take 1 tablet by mouth every morning. Reported on 01/05/2015    . nystatin cream (MYCOSTATIN) Apply 1 application topically 2 (two) times daily. 30 g 0  . OVER THE COUNTER MEDICATION Place 1-2 drops into both eyes daily as needed (dry red eyes.). Reported on 01/05/2015    . pioglitazone (ACTOS) 15 MG tablet Take 1 tablet by mouth once daily 90 tablet 0  . Tiotropium Bromide-Olodaterol (STIOLTO RESPIMAT) 2.5-2.5 MCG/ACT AERS Inhale 2 puffs into the lungs daily. 1 g 0  . Tiotropium Bromide-Olodaterol (STIOLTO RESPIMAT) 2.5-2.5 MCG/ACT AERS Inhale 2 puffs into the lungs daily. 4 g 6  . UNABLE TO FIND CPAP    . venlafaxine XR (EFFEXOR-XR) 37.5 MG 24 hr capsule TAKE 1 CAPSULE BY MOUTH ONCE DAILY WITH BREAKFAST 90 capsule 0  . vitamin C (ASCORBIC ACID) 500 MG tablet Take 500 mg by mouth every morning.      No current  facility-administered medications on file prior to visit.    BP 122/62 (BP Location: Right Arm, Cuff Size: Normal)   Pulse (!) 116   Temp (!) 96.5 F (35.8 C) (Temporal)   Resp 16   Ht 6' (1.829 m)   Wt 255 lb 9.6 oz (115.9 kg)   SpO2 95% Comment: on 2 liters of O2  BMI 34.67 kg/m       Objective:   Physical Exam   General- No acute distress. Pleasant patient. Neck- Full range of motion, no jvd Lungs- Clear, even and unlabored. Heart- regular rate and rhythm. Neurologic- CNII- XII grossly intact. Lower ext- no pedal edema. calfs symmetric. Negative homans signs.      Assessment & Plan:  You appear clinically stable with good 02 sat today. Recommend use your stiolto inhaler once a day. We are also sending rx of neb machine with albuterol solution to your pharmacy. Continue to use o2 daily.  We are getting cbc, cmp, psa and urine today.  We discussed atelectasis on your imaging studies. Can do incentive spirometer as you have that at your home. Video showed. If you get a lot of productive cough, fever or chills let us know.  Referral for copd placed. Call pulmonologist office they can see the referral.  Stay hydrated. Follow gfr today.  Follow up date to be determined after lab review or as needed  Time spent with patient today was  45 minutes which consisted of chart review, discussing diagnosis, work up,  treatment and documentation.  [name], PA-C

## 2019-05-13 ENCOUNTER — Telehealth: Payer: Self-pay

## 2019-05-13 LAB — COMPREHENSIVE METABOLIC PANEL
ALT: 17 U/L (ref 0–53)
AST: 19 U/L (ref 0–37)
Albumin: 4.1 g/dL (ref 3.5–5.2)
Alkaline Phosphatase: 77 U/L (ref 39–117)
BUN: 14 mg/dL (ref 6–23)
CO2: 27 mEq/L (ref 19–32)
Calcium: 10.5 mg/dL (ref 8.4–10.5)
Chloride: 103 mEq/L (ref 96–112)
Creatinine, Ser: 1.16 mg/dL (ref 0.40–1.50)
GFR: 59.47 mL/min — ABNORMAL LOW (ref >60.00)
Glucose, Bld: 126 mg/dL — ABNORMAL HIGH (ref 70–99)
Potassium: 4.6 mEq/L (ref 3.5–5.1)
Sodium: 141 mEq/L (ref 135–145)
Total Bilirubin: 0.5 mg/dL (ref 0.2–1.2)
Total Protein: 6.7 g/dL (ref 6.0–8.3)

## 2019-05-13 LAB — CBC WITH DIFFERENTIAL/PLATELET
Basophils Absolute: 0.1 10*3/uL (ref 0.0–0.1)
Basophils Relative: 1.2 % (ref 0.0–3.0)
Eosinophils Absolute: 0.2 10*3/uL (ref 0.0–0.7)
Eosinophils Relative: 2.9 % (ref 0.0–5.0)
HCT: 44.6 % (ref 39.0–52.0)
Hemoglobin: 14.4 g/dL (ref 13.0–17.0)
Lymphocytes Relative: 28.7 % (ref 12.0–46.0)
Lymphs Abs: 2 10*3/uL (ref 0.7–4.0)
MCHC: 32.4 g/dL (ref 30.0–36.0)
MCV: 97.7 fl (ref 78.0–100.0)
Monocytes Absolute: 0.7 10*3/uL (ref 0.1–1.0)
Monocytes Relative: 10 % (ref 3.0–12.0)
Neutro Abs: 3.9 10*3/uL (ref 1.4–7.7)
Neutrophils Relative %: 57.2 % (ref 43.0–77.0)
Platelets: 212 10*3/uL (ref 150.0–400.0)
RBC: 4.56 Mil/uL (ref 4.22–5.81)
RDW: 14.6 % (ref 11.5–15.5)
WBC: 6.9 10*3/uL (ref 4.0–10.5)

## 2019-05-13 LAB — PSA: PSA: 0.99 ng/mL (ref 0.10–4.00)

## 2019-05-13 NOTE — Telephone Encounter (Signed)
Patient Physical therapist Ranae called in to advise PA [name]  That the patient missed his physical therapy appointment on yesterday 05/12/2018

## 2019-05-19 ENCOUNTER — Telehealth: Payer: Self-pay | Admitting: Medical

## 2019-05-19 NOTE — Progress Notes (Signed)
  Chronic Care Management   Note  05/19/2019 Name: Lance Powell MRN: [id number] DOB: 1930-01-01  Lance Powell is a 86 y.o. year old male who is a primary care patient of [name], Iris Pert. I reached out to Kathlen Mody by phone today in response to a referral sent by Lance Powell PCP, [name], [name], PA-C.   Lance Powell was given information about Chronic Care Management services today including:  1. CCM service includes personalized support from designated clinical staff supervised by his physician, including individualized plan of care and coordination with other care providers 2. 24/7 contact phone numbers for assistance for urgent and routine care needs. 3. Service will only be billed when office clinical staff spend 20 minutes or more in a month to coordinate care. 4. Only one practitioner may furnish and bill the service in a calendar month. 5. The patient may stop CCM services at any time (effective at the end of the month) by phone call to the office staff.   Patient agreed to services and verbal consent obtained.    This note is not being shared with the patient for the following reason: To respect privacy (The patient or proxy has requested that the information not be shared).  Follow up plan:   Raynicia Dukes [employer] Scheduler

## 2019-05-21 ENCOUNTER — Other Ambulatory Visit: Payer: Self-pay | Admitting: Medical

## 2019-05-31 ENCOUNTER — Telehealth: Payer: Self-pay | Admitting: Medical

## 2019-05-31 NOTE — Telephone Encounter (Signed)
Appt made for 3:20

## 2019-05-31 NOTE — Telephone Encounter (Signed)
Will you give them 1:40 appointment slot tomorrow. Need to see how swollen. Get labs and probable chest xray to see exactly waht is going on. With his age and hx 40 minutes would be helpful. Early afternoon best.

## 2019-05-31 NOTE — Telephone Encounter (Signed)
Patient's daughter states her dad feet were swollen and he stated his legs & bottom of feet and  been swollen for a couple of weeks, no bruising . Both lower extremities . And the daughter would like some recommendations on what needs to be done.

## 2019-05-31 NOTE — Telephone Encounter (Signed)
Caller : Eyvonne Mechanic Daughter Call Back # [phone]  Per patient's daughter Collie Siad, patient's feet and legs are swollen. Patient's daughter would like a call back .

## 2019-06-01 ENCOUNTER — Ambulatory Visit: Payer: Medicare Other | Admitting: Medical

## 2019-06-01 ENCOUNTER — Ambulatory Visit (HOSPITAL_BASED_OUTPATIENT_CLINIC_OR_DEPARTMENT_OTHER)
Admission: RE | Admit: 2019-06-01 | Discharge: 2019-06-01 | Disposition: A | Discharge status: Home / Self Care | Payer: Medicare Other | Source: Ambulatory Visit | Attending: Medical | Admitting: Medical

## 2019-06-01 ENCOUNTER — Other Ambulatory Visit: Payer: Self-pay | Admitting: *Deleted

## 2019-06-01 ENCOUNTER — Encounter: Payer: Self-pay | Admitting: Medical

## 2019-06-01 ENCOUNTER — Other Ambulatory Visit: Payer: Self-pay

## 2019-06-01 VITALS — BP 115/62 | HR 112 | Temp 96.8°F | Resp 18 | Ht 72.0 in | Wt 254.0 lb

## 2019-06-01 DIAGNOSIS — M7989 Other specified soft tissue disorders: Secondary | ICD-10-CM | POA: Diagnosis not present

## 2019-06-01 DIAGNOSIS — M79609 Pain in unspecified limb: Secondary | ICD-10-CM | POA: Insufficient documentation

## 2019-06-01 DIAGNOSIS — R600 Localized edema: Secondary | ICD-10-CM | POA: Diagnosis not present

## 2019-06-01 DIAGNOSIS — R0600 Dyspnea, unspecified: Secondary | ICD-10-CM

## 2019-06-01 DIAGNOSIS — R918 Other nonspecific abnormal finding of lung field: Secondary | ICD-10-CM | POA: Diagnosis not present

## 2019-06-01 DIAGNOSIS — J449 Chronic obstructive pulmonary disease, unspecified: Secondary | ICD-10-CM

## 2019-06-01 LAB — COMPREHENSIVE METABOLIC PANEL
AG Ratio: 1.4 (calc) (ref 1.0–2.5)
ALT: 19 U/L (ref 9–46)
AST: 18 U/L (ref 10–35)
Albumin: 4.1 g/dL (ref 3.6–5.1)
Alkaline phosphatase (APISO): 79 U/L (ref 35–144)
BUN/Creatinine Ratio: 15 (calc) (ref 6–22)
BUN: 18 mg/dL (ref 7–25)
CO2: 27 mmol/L (ref 20–32)
Calcium: 10.5 mg/dL — ABNORMAL HIGH (ref 8.6–10.3)
Chloride: 105 mmol/L (ref 98–110)
Creat: 1.2 mg/dL — ABNORMAL HIGH (ref 0.70–1.11)
Globulin: 2.9 g/dL (calc) (ref 1.9–3.7)
Glucose, Bld: 161 mg/dL — ABNORMAL HIGH (ref 65–99)
Potassium: 4.7 mmol/L (ref 3.5–5.3)
Sodium: 141 mmol/L (ref 135–146)
Total Bilirubin: 0.5 mg/dL (ref 0.2–1.2)
Total Protein: 7 g/dL (ref 6.1–8.1)

## 2019-06-01 LAB — BRAIN NATRIURETIC PEPTIDE: Brain Natriuretic Peptide: 21 pg/mL (ref <100)

## 2019-06-01 MED ORDER — ALBUTEROL SULFATE (2.5 MG/3ML) 0.083% IN NEBU: 2.5000 mg | INHALATION_SOLUTION | Freq: Four times a day (QID) | RESPIRATORY_TRACT | 1 refills | Status: DC | PRN

## 2019-06-01 NOTE — Progress Notes (Signed)
4

## 2019-06-01 NOTE — Progress Notes (Addendum)
Subjective:    Patient ID: Lance Powell, male    DOB: 1929-02-11, 86 y.o.   MRN: [id number]  HPI  Pt in for recent swelling of both legs. Little worse on rt side for past 2 weeks. He had some slight sharp pain medial rt calf about one week ago. He massaged area and pain went away.  Pt is on oxygen daily. Has copd history. Pt is using stiolto and I had asked staff to try to get him neb machine. He is not any shorter breath than baseline.  Pt not feeling more sob on his back or on his side. Pt weight is stable.    Review of Systems  Constitutional: Negative for chills, fatigue and fever.  HENT: Negative for facial swelling.   Respiratory: Positive for shortness of breath. Negative for cough, chest tightness and wheezing.        Close to his baseline/not worse per pt.  Cardiovascular: Negative for chest pain and palpitations.  Gastrointestinal: Negative for abdominal pain, diarrhea, nausea and vomiting.  Musculoskeletal: Negative for gait problem and myalgias.  Skin: Negative for rash.  Neurological: Negative for dizziness and headaches.  Hematological: Negative for adenopathy.  Psychiatric/Behavioral: Negative for behavioral problems and confusion.    Past Medical History:  Diagnosis Date  . 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 COPD (chronic obstructive pulmonary disease) ([place])   . Depression 03/08/2014  . Diabetes mellitus   . GERD (gastroesophageal reflux disease) 03/08/2014  . History of blood transfusion   . Hyperlipemia   . Hypertension   . OSA (obstructive sleep apnea)   . Osteoarthritis   . Rheumatoid arthritis(714.0)   . Sleep apnea    cpap     Social History   Socioeconomic History  . Marital status: Married    Spouse name: Not on file  . Number of children: 6  . Years of education: Not on file  . Highest education level: Not on file  Occupational History  . Occupation: Retired    Comment: Print production planner  . Smoking status: Former Smoker   Packs/day: 4.00    Years: 30.00    Pack years: 120.00    Types: Cigarettes    Quit date: 01/21/1978    Years since quitting: 41.3  . Smokeless tobacco: Never Used  Substance and Sexual Activity  . Alcohol use: No  . Drug use: No  . Sexual activity: Not on file  Other Topics Concern  . Not on file  Social History Narrative  . Not on file   Social Determinants of Health   Financial Resource Strain:   . Difficulty of Paying Living Expenses:   Food Insecurity:   . Worried About Charity fundraiser in the Last Year:   . Arboriculturist in the Last Year:   Transportation Needs:   . Film/video editor (Medical):   Marland Kitchen Lack of Transportation (Non-Medical):   Physical Activity:   . Days of Exercise per Week:   . Minutes of Exercise per Session:   Stress:   . Feeling of Stress :   Social Connections:   . Frequency of Communication with Friends and Family:   . Frequency of Social Gatherings with Friends and Family:   . Attends Religious Services:   . Active Member of Clubs or Organizations:   . Attends Archivist Meetings:   Marland Kitchen Marital Status:   Intimate Partner Violence:   . Fear of Current or Ex-Partner:   .  Emotionally Abused:   Marland Kitchen Physically Abused:   . Sexually Abused:     Past Surgical History:  Procedure Laterality Date  . APPENDECTOMY  1969  . CHOLECYSTECTOMY N/A 06/16/2014   Procedure: LAPAROSCOPIC CHOLECYSTECTOMY;  Surgeon: Greer Pickerel, MD;  Location: [facility];  Service: General;  Laterality: N/A;  . CYSTOSCOPY  01/23/2011   Procedure: CYSTOSCOPY;  Surgeon: Molli Hazard, MD;  Location[facility];  Service: Urology;  Laterality: N/A;  . FLEXIBLE SIGMOIDOSCOPY  01/07/2012   Procedure: FLEXIBLE SIGMOIDOSCOPY;  Surgeon: Ladene Artist, MD,FACG;  Location: [facility];  Service: Endoscopy;  Laterality: N/A;  . HERNIA REPAIR  1990   right and left inguinal  . NISSEN FUNDOPLICATION    . 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  TONSILLECTOMY  1937  . TRANSURETHRAL RESECTION OF PROSTATE  01/23/2011   Procedure: TRANSURETHRAL RESECTION OF THE PROSTATE WITH GYRUS INSTRUMENTS;  Surgeon: Molli Hazard, MD;  Location: [facility];  Service: Urology;  Laterality: N/A;  . VASECTOMY  1972    Family History  Problem Relation Age of Onset  . Heart disease Mother   . Emphysema Brother   . Cancer Daughter        breast    No Known Allergies  Current Outpatient Medications on File Prior to Visit  Medication Sig Dispense Refill  . albuterol (VENTOLIN HFA) 108 (90 Base) MCG/ACT inhaler Inhale 2 puffs into the lungs every 6 (six) hours as needed for wheezing or shortness of breath. 8 g 2  . aspirin 81 MG tablet Take 1 tablet (81 mg total) by mouth every morning. Stop for 1 week and then resume (Patient taking differently: Take 81 mg by mouth every morning. )    . atorvastatin (LIPITOR) 40 MG tablet TAKE 1 TABLET BY MOUTH ONCE DAILY IN THE MORNING 90 tablet 0  . celecoxib (CELEBREX) 200 MG capsule TAKE 1 CAPSULE BY MOUTH ONCE DAILY IN THE EVENING 30 capsule 0  . cholecalciferol (VITAMIN D) 1000 UNITS tablet Take 2,000 Units by mouth 2 (two) times daily.     . diclofenac sodium (VOLTAREN) 1 % GEL Apply 2 g topically 4 (four) times daily. 50 g 0  . enalapril (VASOTEC) 2.5 MG tablet Take 2.5 mg by mouth every morning. Reported on 07/12/2015    . glucose blood (BAYER CONTOUR TEST) test strip Use as instructed to check blood sugar twice a day.  DX  E11.9 100 each 5  . Indacaterol Maleate (ARCAPTA NEOHALER) 75 MCG CAPS Place into inhaler and inhale.    . meclizine (ANTIVERT) 12.5 MG tablet Take 1 tablet (12.5 mg total) by mouth 3 (three) times daily as needed for dizziness. 30 tablet 0  . metFORMIN (GLUCOPHAGE) 500 MG tablet TAKE 1 TABLET BY MOUTH THREE TIMES DAILY 90 tablet 0  . misoprostol (CYTOTEC) 100 MCG tablet TAKE 1 TABLET BY MOUTH TWICE DAILY (AM&PM) AND TAKE 2 TABLETS AT BEDTIME 120 tablet 0  . Multiple  Vitamins-Minerals (CENTRUM) tablet Take 1 tablet by mouth every morning. Reported on 01/05/2015    . nystatin cream (MYCOSTATIN) Apply 1 application topically 2 (two) times daily. 30 g 0  . OVER THE COUNTER MEDICATION Place 1-2 drops into both eyes daily as needed (dry red eyes.). Reported on 01/05/2015    . pioglitazone (ACTOS) 15 MG tablet Take 1 tablet by mouth once daily 90 tablet 0  . Tiotropium Bromide-Olodaterol (STIOLTO RESPIMAT) 2.5-2.5 MCG/ACT AERS Inhale 2 puffs into the  lungs daily. 1 g 0  . Tiotropium Bromide-Olodaterol (STIOLTO RESPIMAT) 2.5-2.5 MCG/ACT AERS Inhale 2 puffs into the lungs daily. 4 g 6  . UNABLE TO FIND CPAP    . venlafaxine XR (EFFEXOR-XR) 37.5 MG 24 hr capsule TAKE 1 CAPSULE BY MOUTH ONCE DAILY WITH BREAKFAST 90 capsule 0  . vitamin C (ASCORBIC ACID) 500 MG tablet Take 500 mg by mouth every morning.      No current facility-administered medications on file prior to visit.    BP 115/62 (BP Location: Left Arm, Patient Position: Sitting, Cuff Size: Large)   Pulse (!) 124   Temp (!) 96.8 F (36 C) (Temporal)   Resp 18   Ht 6' (1.829 m)   Wt 254 lb (115.2 kg)   SpO2 91%   BMI 34.45 kg/m       Objective:   Physical Exam  General Mental Status- Alert. General Appearance- Not in acute distress.   Skin General: Color- Normal Color. Moisture- Normal Moisture.  Neck Carotid Arteries- Normal color. Moisture- Normal Moisture. No carotid bruits. No JVD.  Chest and Lung Exam Auscultation: Breath Sounds:-Normal.  Cardiovascular Auscultation:Rythm- Regular. Murmurs & Other Heart Sounds:Auscultation of the heart reveals- No Murmurs.  Abdomen Inspection:-Inspeection Normal. Palpation/Percussion:Note:No mass. Palpation and Percussion of the abdomen reveal- Non Tender, Non Distended + BS, no rebound or guarding.    Neurologic Cranial Nerve exam:- CN III-XII intact(No nystagmus), symmetric smile.  Strength:- 5/5 equal and symmetric strength both upper  and lower extremities.  Lower extremity-1+ pedal edema bilaterally.  Right calf looks slightly more swollen compared to the left.  Minimal faint positive Homans' sign on right side.      Assessment & Plan:  Collie [phone].  Recently some mild bilateral lower extremity lower extremity edema.  On exam it appears that the right side is worse than the left.  Also you have mild pain in the popliteal area behind the right knee.  For work-up as this will get chest x-ray, CMP, BNP and right lower extremity ultrasound.  Make sure not chf or dvt present. If studies negative then will advise treat for dependant edema. Elevate legs daily and use compression socks  If chf or dvt will let you know treatment regimen.  Continue inhalers and rx neb machine sent.   Follow up date to be determined after study review.  [name], PA-C   Time spent with patient today was  45 minutes which consisted of chart review, discussing diffential diagnosis, work up, treatment depending on work up, answering question  and documentation.

## 2019-06-01 NOTE — Patient Instructions (Addendum)
Recently some mild bilateral lower extremity lower extremity edema.  On exam it appears that the right side is worse than the left.  Also you have mild pain in the popliteal area behind the right knee.  For work-up as this will get chest x-ray, CMP, BNP and right lower extremity ultrasound.  Make sure not chf or dvt present. If studies negative then will advise treat for dependant edema. Elevate legs daily and use compression socks  If chf or dvt will let you know treatment regimen.  Continue inhalers and rx neb machine sent.   Follow up date to be determined after study review

## 2019-06-03 ENCOUNTER — Other Ambulatory Visit: Payer: Self-pay | Admitting: Medical

## 2019-06-04 ENCOUNTER — Other Ambulatory Visit: Payer: Self-pay | Admitting: Medical

## 2019-06-07 ENCOUNTER — Telehealth: Payer: Self-pay | Admitting: Medical

## 2019-06-07 DIAGNOSIS — J449 Chronic obstructive pulmonary disease, unspecified: Secondary | ICD-10-CM | POA: Diagnosis not present

## 2019-06-07 NOTE — Telephone Encounter (Signed)
Would recommend that he use compression socks daily. As much as possible particularly during the day. If he want to them off at night that would be fine.

## 2019-06-07 NOTE — Telephone Encounter (Signed)
Patient daughter Collie Siad is calling stating [name] recommended compression socks for the patient. Daughter is wondering how often/frequent patient should wear socks.  Daughter ask for a call back to  Daryll Drown (daughter) at [phone].

## 2019-06-08 NOTE — Telephone Encounter (Signed)
I have not seen the form yet

## 2019-06-08 NOTE — Telephone Encounter (Signed)
Patient's daughters wanted to know what is the compression strength & [facility] will be faxing over a form for the socks

## 2019-06-09 ENCOUNTER — Telehealth: Payer: Self-pay | Admitting: Medical

## 2019-06-09 NOTE — Telephone Encounter (Signed)
Lance Powell called in informed her that form did come in today and forms was placed in basket. I told her to allow 48-72 hours or so to have forms filled out

## 2019-06-09 NOTE — Telephone Encounter (Signed)
Forms just came in today . Placed in your basket .

## 2019-06-10 NOTE — Telephone Encounter (Signed)
I signed the form and gave to Jarrett Soho to fax back.

## 2019-06-11 ENCOUNTER — Other Ambulatory Visit: Payer: Self-pay

## 2019-06-11 ENCOUNTER — Ambulatory Visit (INDEPENDENT_AMBULATORY_CARE_PROVIDER_SITE_OTHER)
Admission: RE | Admit: 2019-06-11 | Discharge: 2019-06-11 | Disposition: A | Discharge status: Home / Self Care | Payer: Medicare Other | Source: Ambulatory Visit | Attending: Emergency Medicine | Admitting: Emergency Medicine

## 2019-06-11 ENCOUNTER — Ambulatory Visit: Payer: Medicare Other | Admitting: Emergency Medicine

## 2019-06-11 ENCOUNTER — Encounter: Payer: Self-pay | Admitting: Emergency Medicine

## 2019-06-11 VITALS — BP 142/62 | HR 118 | Temp 98.0°F | Ht 72.0 in | Wt 257.8 lb

## 2019-06-11 DIAGNOSIS — R109 Unspecified abdominal pain: Secondary | ICD-10-CM | POA: Diagnosis not present

## 2019-06-11 DIAGNOSIS — J438 Other emphysema: Secondary | ICD-10-CM

## 2019-06-11 DIAGNOSIS — J9611 Chronic respiratory failure with hypoxia: Secondary | ICD-10-CM | POA: Diagnosis not present

## 2019-06-11 DIAGNOSIS — S299XXA Unspecified injury of thorax, initial encounter: Secondary | ICD-10-CM | POA: Diagnosis not present

## 2019-06-11 DIAGNOSIS — W1800XA Striking against unspecified object with subsequent fall, initial encounter: Secondary | ICD-10-CM

## 2019-06-11 DIAGNOSIS — W19XXXA Unspecified fall, initial encounter: Secondary | ICD-10-CM | POA: Insufficient documentation

## 2019-06-11 DIAGNOSIS — G4733 Obstructive sleep apnea (adult) (pediatric): Secondary | ICD-10-CM

## 2019-06-11 NOTE — Patient Instructions (Addendum)
CXR today to evaluate for possible rib fracture Please continue Stiolto 2 puffs once daily. Keep albuterol available to use either 2 puffs or 1 nebulizer treatment up to every 4 hours if needed for shortness of breath, chest tightness, wheezing. Walking oximetry today on 2 L/min to ensure that this is adequate flow rate Follow with Dr Lamonte Sakai in 4 months or sooner if you have any problems.

## 2019-06-11 NOTE — Assessment & Plan Note (Signed)
Repeat walking oximetry today to ensure that 2 L/min pulsed is adequate to maintain adequate saturations

## 2019-06-11 NOTE — Assessment & Plan Note (Signed)
Continue CPAP.  

## 2019-06-11 NOTE — Assessment & Plan Note (Signed)
Seems to have improved with the Stiolto at least based on the report from his family.  He has albuterol which he does not use frequently.  He is up-to-date on his Covid vaccine.  Plan to continue the [place].  Educated him on using albuterol as needed including the nebulized version.

## 2019-06-11 NOTE — Assessment & Plan Note (Signed)
With left flank pain.  We will perform a chest x-ray, dedicated rib films to rule out fracture.

## 2019-06-11 NOTE — Progress Notes (Signed)
Subjective:    Patient ID: Lance Powell, male    DOB: 07-May-1929, 86 y.o.   MRN: [id number]  HPI  ROV 10/16/2018 --[name] returns for follow-up.  He is 86 and has a history of significant former tobacco (120 pack years), COPD, obstructive sleep apnea on CPAP with oxygen bled into his system.  He has hypoxemic respiratory failure and uses 2 L/min with exertion.  Based on our discussions last year he has not been on any scheduled bronchodilator therapy.  We hope to get him a portable oxygen concentrator last year but there was difficulty obtaining because he also needs home concentrator to bleed oxygen into his CPAP. Today he reports that his exertional tolerance is good, but his family notes that this doesn't seem true. Every evening he produces mucous, gray. He has had some dizziness. He has been working with PT, and has noticed some desats associated with this.   ROV 06/11/19 --86 year old man with severe COPD, OSA on CPAP plus O2, chronic hypoxemic respiratory failure on 2 L/min with exertion.  I tried starting Stiolto last September to see if he would get benefit.  He reports that he had a fall in the last 2 days that resulted in impact to his L chest, hard to take a deep breath. He is unsure whether he has benefited from [place] - but his daughter says it has.  COVID vaccine up to date. He just added albuterol nebs to have available.  He underwent bilateral lower extremity Dopplers that were negative, chest x-ray on 06/01/2019 to evaluate lower extremity edema.  I reviewed the chest x-ray, it shows some emphysematous changes, streaky lower lobe interstitial changes.  No overt infiltrates  MDM: Reviewed chest x-ray and Doppler ultrasound 06/01/2019 Reviewed family medicine office visit 06/01/2019   No flowsheet data found.  Review of Systems As per HPI      Objective:   Physical Exam Vitals:   06/11/19 1411  BP: (!) 142/62  Pulse: (!) 118  Temp: 98 F (36.7 C)  TempSrc:  Temporal  SpO2: 96%  Weight: 257 lb 12.8 oz (116.9 kg)  Height: 6' (1.829 m)   Gen: Pleasant, overwt man, in no distress,  normal affect  ENT: No lesions,  mouth clear,  oropharynx clear, no postnasal drip  Neck: No JVD, no stridor  Lungs: No use of accessory muscles, clear on a normal breath, he does have wheeze on a forced expiration.   Cardiovascular: RRR, heart sounds normal, no murmur or gallops, no peripheral edema  Musculoskeletal: No deformities, some tenderness left costal margin with palpation  Neuro: alert, awake, a bit tangential, poor memory.   Skin: Warm, no lesions or rashes      Assessment & Plan:  COPD (chronic obstructive pulmonary disease) ([place]) Seems to have improved with the Stiolto at least based on the report from his family.  He has albuterol which he does not use frequently.  He is up-to-date on his Covid vaccine.  Plan to continue the [place].  Educated him on using albuterol as needed including the nebulized version.  Obstructive sleep apnea Continue CPAP  Chronic respiratory failure ([place]) Repeat walking oximetry today to ensure that 2 L/min pulsed is adequate to maintain adequate saturations  Accident due to mechanical fall without injury With left flank pain.  We will perform a chest x-ray, dedicated rib films to rule out fracture.  Lance Apo, MD, PhD 06/11/2019, 2:39 PM [facility] [phone] or if no answer  [phone]  

## 2019-06-18 ENCOUNTER — Telehealth: Payer: Self-pay | Admitting: Emergency Medicine

## 2019-06-18 NOTE — Telephone Encounter (Signed)
Called and spoke with Patient. Patient requested cxr results from Dr Lamonte Sakai, 06/11/19.  At this time, Dr Agustina Caroli results and recommendations have not been posted.  Message routed to Dr Lamonte Sakai to advise

## 2019-06-22 ENCOUNTER — Other Ambulatory Visit: Payer: Self-pay

## 2019-06-22 ENCOUNTER — Telehealth: Payer: Self-pay | Admitting: Medical

## 2019-06-22 ENCOUNTER — Ambulatory Visit: Payer: Medicare Other | Admitting: Medical

## 2019-06-22 VITALS — BP 140/70 | HR 122 | Resp 18 | Ht 72.0 in | Wt 258.6 lb

## 2019-06-22 DIAGNOSIS — J449 Chronic obstructive pulmonary disease, unspecified: Secondary | ICD-10-CM

## 2019-06-22 DIAGNOSIS — R0781 Pleurodynia: Secondary | ICD-10-CM

## 2019-06-22 DIAGNOSIS — Z794 Long term (current) use of insulin: Secondary | ICD-10-CM

## 2019-06-22 DIAGNOSIS — E118 Type 2 diabetes mellitus with unspecified complications: Secondary | ICD-10-CM

## 2019-06-22 DIAGNOSIS — R350 Frequency of micturition: Secondary | ICD-10-CM | POA: Diagnosis not present

## 2019-06-22 DIAGNOSIS — R413 Other amnesia: Secondary | ICD-10-CM

## 2019-06-22 LAB — POC URINALSYSI DIPSTICK (AUTOMATED)
Bilirubin, UA: NEGATIVE
Blood, UA: NEGATIVE
Glucose, UA: NEGATIVE
Ketones, UA: NEGATIVE
Leukocytes, UA: NEGATIVE
Nitrite, UA: NEGATIVE
Protein, UA: NEGATIVE
Spec Grav, UA: 1.025 (ref 1.010–1.025)
Urobilinogen, UA: 0.2 E.U./dL
pH, UA: 5 (ref 5.0–8.0)

## 2019-06-22 MED ORDER — METFORMIN HCL 500 MG PO TABS: 500.0000 mg | ORAL_TABLET | Freq: Three times a day (TID) | ORAL | 3 refills | Status: DC

## 2019-06-22 MED ORDER — ATORVASTATIN CALCIUM 40 MG PO TABS: ORAL_TABLET | ORAL | 3 refills | Status: DC

## 2019-06-22 MED ORDER — MISOPROSTOL 100 MCG PO TABS: ORAL_TABLET | ORAL | 3 refills | Status: DC

## 2019-06-22 NOTE — Telephone Encounter (Signed)
Dr. Lamonte Sakai,  I saw Lance Powell today. [facility] pt. He told me today he intermittently feels he needs 3 liter. It appeared your office he ambulated and did well on 2 liters. He looked stable and in our office had 94% after being on 3 liters. When he first got in office about was 88%. Will ask him to to give me update on how often he feels need to use 3 liters over next week. When he update me I will update you.  Thanks for your help,  [name], PA-C

## 2019-06-22 NOTE — Progress Notes (Signed)
Subjective:    Patient ID: Lance Powell, male    DOB: [birth year], 86 y.o.   MRN: [id number]  HPI   Pt in for follow up.  Pt state he did fell on garage on old TV set.   He did see pulmonlogist and they did xray.No fracture of rib. Did have some old scarring at left lung base.  Had fall 2 weeks ago. Xray done at 5 days after fall and no fracture.  Hx of pedal edema.  Family is concerned about his memory. He is getting progressively more forgetful.   Pt is diabetic. He is not eating a lot of carbohydrates. He is eating a lot of carbs. Still taking meds as prescribed. Last a1c was 6.8.   Pt need refill of his cytotec. Previously rx'd by GI MD.  Hx of copd and seen by pulmonolgist. That visit note indicates he ambulates well with 2 L. Pt states he will use 3 liters sometimes if 02 sat drops less than 90%.    Review of Systems  Constitutional: Negative for chills, fatigue and fever.  Respiratory: Negative for cough, chest tightness, shortness of breath and wheezing.   Cardiovascular: Negative for chest pain and palpitations.  Gastrointestinal: Negative for abdominal pain.  Genitourinary: Negative for dysuria and flank pain.  Musculoskeletal: Negative for back pain.  Skin: Negative for rash.  Neurological: Negative for dizziness, seizures, syncope, weakness, light-headedness and headaches.       Pt daughter does think he is getting more forgetful. Does not remember persons names.  Hematological: Negative for adenopathy. Does not bruise/bleed easily.  Psychiatric/Behavioral: Negative for behavioral problems and confusion.    Past Medical History:  Diagnosis Date  . 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 COPD (chronic obstructive pulmonary disease) ([place])   . Depression 03/08/2014  . Diabetes mellitus   . GERD (gastroesophageal reflux disease) 03/08/2014  . History of blood transfusion   . Hyperlipemia   . Hypertension   . OSA (obstructive sleep apnea)   . Osteoarthritis   .  Rheumatoid arthritis(714.0)   . Sleep apnea    cpap     Social History   Socioeconomic History  . Marital status: Married    Spouse name: Not on file  . Number of children: 6  . Years of education: Not on file  . Highest education level: Not on file  Occupational History  . Occupation: Retired    Comment: Print production planner  . Smoking status: Former Smoker    Packs/day: 4.00    Years: 30.00    Pack years: 120.00    Types: Cigarettes    Quit date: 01/21/1978    Years since quitting: 41.4  . Smokeless tobacco: Never Used  Substance and Sexual Activity  . Alcohol use: No  . Drug use: No  . Sexual activity: Not on file  Other Topics Concern  . Not on file  Social History Narrative  . Not on file   Social Determinants of Health   Financial Resource Strain:   . Difficulty of Paying Living Expenses:   Food Insecurity:   . Worried About Charity fundraiser in the Last Year:   . Arboriculturist in the Last Year:   Transportation Needs:   . Film/video editor (Medical):   Marland Kitchen Lack of Transportation (Non-Medical):   Physical Activity:   . Days of Exercise per Week:   . Minutes of Exercise per Session:   Stress:   . Feeling  of Stress :   Social Connections:   . Frequency of Communication with Friends and Family:   . Frequency of Social Gatherings with Friends and Family:   . Attends Religious Services:   . Active Member of Clubs or Organizations:   . Attends Archivist Meetings:   Marland Kitchen Marital Status:   Intimate Partner Violence:   . Fear of Current or Ex-Partner:   . Emotionally Abused:   Marland Kitchen Physically Abused:   . Sexually Abused:     Past Surgical History:  Procedure Laterality Date  . APPENDECTOMY  1969  . CHOLECYSTECTOMY N/A 06/16/2014   Procedure: LAPAROSCOPIC CHOLECYSTECTOMY;  Surgeon: Greer Pickerel, MD;  Location: [facility];  Service: General;  Laterality: N/A;  . CYSTOSCOPY  01/23/2011   Procedure: CYSTOSCOPY;  Surgeon: Molli Hazard, MD;   Location: [facility];  Service: Urology;  Laterality: N/A;  . FLEXIBLE SIGMOIDOSCOPY  01/07/2012   Procedure: FLEXIBLE SIGMOIDOSCOPY;  Surgeon: Ladene Artist, MD,FACG;  Location: [facility];  Service: Endoscopy;  Laterality: N/A;  . HERNIA REPAIR  1990   right and left inguinal  . NISSEN FUNDOPLICATION    . 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 TONSILLECTOMY  1937  . TRANSURETHRAL RESECTION OF PROSTATE  01/23/2011   Procedure: TRANSURETHRAL RESECTION OF THE PROSTATE WITH GYRUS INSTRUMENTS;  Surgeon: Molli Hazard, MD;  Location: [facility];  Service: Urology;  Laterality: N/A;  . VASECTOMY  1972    Family History  Problem Relation Age of Onset  . Heart disease Mother   . Emphysema Brother   . Cancer Daughter        breast    No Known Allergies  Current Outpatient Medications on File Prior to Visit  Medication Sig Dispense Refill  . albuterol (PROVENTIL) (2.5 MG/3ML) 0.083% nebulizer solution Take 3 mLs (2.5 mg total) by nebulization every 6 (six) hours as needed for wheezing or shortness of breath. 150 mL 1  . albuterol (VENTOLIN HFA) 108 (90 Base) MCG/ACT inhaler Inhale 2 puffs into the lungs every 6 (six) hours as needed for wheezing or shortness of breath. 8 g 2  . aspirin 81 MG tablet Take 1 tablet (81 mg total) by mouth every morning. Stop for 1 week and then resume (Patient taking differently: Take 81 mg by mouth every morning. )    . atorvastatin (LIPITOR) 40 MG tablet TAKE 1 TABLET BY MOUTH ONCE DAILY IN THE MORNING 90 tablet 0  . celecoxib (CELEBREX) 200 MG capsule TAKE 1 CAPSULE BY MOUTH ONCE DAILY IN THE EVENING 30 capsule 0  . cholecalciferol (VITAMIN D) 1000 UNITS tablet Take 2,000 Units by mouth 2 (two) times daily.     . diclofenac sodium (VOLTAREN) 1 % GEL Apply 2 g topically 4 (four) times daily. 50 g 0  . enalapril (VASOTEC) 2.5 MG tablet Take 2.5 mg by mouth every morning. Reported on 07/12/2015    . glucose blood  (BAYER CONTOUR TEST) test strip Use as instructed to check blood sugar twice a day.  DX  E11.9 100 each 5  . Indacaterol Maleate (ARCAPTA NEOHALER) 75 MCG CAPS Place into inhaler and inhale.    . meclizine (ANTIVERT) 12.5 MG tablet Take 1 tablet (12.5 mg total) by mouth 3 (three) times daily as needed for dizziness. 30 tablet 0  . metFORMIN (GLUCOPHAGE) 500 MG tablet TAKE 1 TABLET BY MOUTH THREE TIMES DAILY 90 tablet 0  . misoprostol (CYTOTEC) 100 MCG tablet TAKE  1 TABLET BY MOUTH TWICE DAILY (AM&PM) AND TAKE 2 TABLETS AT BEDTIME 120 tablet 2  . Multiple Vitamins-Minerals (CENTRUM) tablet Take 1 tablet by mouth every morning. Reported on 01/05/2015    . nystatin cream (MYCOSTATIN) Apply 1 application topically 2 (two) times daily. 30 g 0  . OVER THE COUNTER MEDICATION Place 1-2 drops into both eyes daily as needed (dry red eyes.). Reported on 01/05/2015    . pioglitazone (ACTOS) 15 MG tablet Take 1 tablet by mouth once daily 90 tablet 1  . Tiotropium Bromide-Olodaterol (STIOLTO RESPIMAT) 2.5-2.5 MCG/ACT AERS Inhale 2 puffs into the lungs daily. 1 g 0  . Tiotropium Bromide-Olodaterol (STIOLTO RESPIMAT) 2.5-2.5 MCG/ACT AERS Inhale 2 puffs into the lungs daily. 4 g 6  . UNABLE TO FIND CPAP    . venlafaxine XR (EFFEXOR-XR) 37.5 MG 24 hr capsule TAKE 1 CAPSULE BY MOUTH ONCE DAILY WITH BREAKFAST 90 capsule 0  . vitamin C (ASCORBIC ACID) 500 MG tablet Take 500 mg by mouth every morning.      No current facility-administered medications on file prior to visit.    BP 140/70 (BP Location: Left Arm, Patient Position: Sitting, Cuff Size: Large)   Pulse (!) 122   Resp 18   Ht 6' (1.829 m)   Wt 258 lb 9.6 oz (117.3 kg)   SpO2 (!) 88%   BMI 35.07 kg/m       Objective:   Physical Exam  General- No acute distress. Pleasant patient. Neck- Full range of motion, no jvd Lungs- Clear, even and unlabored. Heart- regular rate and rhythm. Neurologic- CNII- XII grossly intact.   Compression sock  measurements ankle 23 cm, calfs 39.5cm  and length  Knee to bottom of foot 47.5 cm    Assessment & Plan:  Recent mild left lower rib region pain.  X-ray recently showed no rib fracture.  Injury was 2 weeks ago and pain is minimal so no need for treatment.  Unnecessary medication might cause side effects.  For frequent urination recently got urine POCT and will get urine culture.  Describe persisting memory changes worse than last time.  Although your Mini-Mental status test in the past did  appear well can refer you for further more in-depth testing through the neurologist.  You could try the over-the-counter supplement  neuriva to see if this helps during the interim.  For diabetes history, continue with current medications.  Recommend low sugar diet.  Since your daughter indicates that you had poor diet recently will go ahead and refer you for diabetic education.  COPD history and recently followed up with pulmonologist.  Continue current inhalers and oxygen.  Stay on 2 L as you're pulmonologist recommended.  However if you do drop less than 90 then you could increase to 3 L.  I did send message to your pulmonologist indicating that you have been dropping your O2 sat some and needing 3 L.  Please give me an update over the next week how many times you have to do this and will let you pulmonologist know.  Note chest x-ray recently to evaluate your ribs did not show any infectious process.  You do have some persistent dependent edema.  Previous work-up for CHF was negative.  I did do compression sock measurements.  We don't have the form from home health.  Daughter states she'll bring that tomorrow and then will fill out the form.  Placing metabolic panel and [id number] to be done past June 18.   Follow-up in  4 to 6 weeks or as needed.  [name], PA-C   Time spent with patient today was 45  minutes which consisted of chart review, discussing diagnoses, work up, treatment, answering question and  documentation.

## 2019-06-22 NOTE — Patient Instructions (Signed)
Recent mild left lower rib region pain.  X-ray recently showed no rib fracture.  Injury was 2 weeks ago and pain is minimal so no need for treatment.  Unnecessary medication might cause side effects.  For frequent urination recently got urine POCT and will get urine culture.  Described persisting memory changes worse than last time.  Although your Mini-Mental status test in the past did appear well can refer you for further more in-depth testing through the neurologist.  You could try the over-the-counter supplement  neuriva to see if this helps during the interim.  For diabetes history, continue with current medications.  Recommend low sugar diet.  Since your daughter indicates that you have  poor diet recently will go ahead and refer you for diabetic education.  COPD history and recently followed up with pulmonologist.  Continue current inhalers and oxygen.  Stay on 2 L as you're pulmonologist recommended.  However if you do drop less than 90% then you could increase to 3 L.  I did send message to your pulmonologist indicating that you have been dropping your O2 sat some and needing 3 L.  Please give me an update over the next week how many times you have to do this and will let you pulmonologist know.  Note chest x-ray recently to evaluate your ribs did not show any infectious process.  You do have some persistent dependent edema.  Previous work-up for CHF was negative.  I did do compression sock measurements.  We don't have the form from home health.  Daughter states she'll bring that tomorrow and then will fill out the form.  Placing metabolic panel and [id number] to be done past June 18.   Follow-up in 4 to 6 weeks or as needed.

## 2019-06-22 NOTE — Telephone Encounter (Signed)
Spoke with the pt  He is asking about cxr results from 06/11/19  Please advise thanks!

## 2019-06-22 NOTE — Telephone Encounter (Signed)
Pt's daughter called back about results  Please return call before 3:00 Call daughter's phone Clint Bolder) [phone]

## 2019-06-22 NOTE — Telephone Encounter (Signed)
Please let him know that his lungs were clear and that there were no broken ribs seen. Thanks.

## 2019-06-23 ENCOUNTER — Encounter: Payer: Self-pay | Admitting: Neurology

## 2019-06-23 NOTE — Telephone Encounter (Signed)
Called and spoke with patient gave him the results from Dr. Lamonte Sakai. He expressed understanding. Nothing further needed at this time.

## 2019-06-23 NOTE — Telephone Encounter (Signed)
Thank you :)

## 2019-06-24 LAB — URINE CULTURE
MICRO NUMBER:: 10543707
Result:: NO GROWTH
SPECIMEN QUALITY:: ADEQUATE

## 2019-06-25 ENCOUNTER — Other Ambulatory Visit: Payer: Self-pay

## 2019-06-25 ENCOUNTER — Ambulatory Visit: Payer: Medicare Other | Admitting: Pharmacist

## 2019-06-25 ENCOUNTER — Other Ambulatory Visit: Payer: Self-pay | Admitting: Medical

## 2019-06-25 DIAGNOSIS — E118 Type 2 diabetes mellitus with unspecified complications: Secondary | ICD-10-CM

## 2019-06-25 DIAGNOSIS — I10 Essential (primary) hypertension: Secondary | ICD-10-CM

## 2019-06-25 DIAGNOSIS — J449 Chronic obstructive pulmonary disease, unspecified: Secondary | ICD-10-CM

## 2019-06-25 DIAGNOSIS — Z794 Long term (current) use of insulin: Secondary | ICD-10-CM

## 2019-06-25 DIAGNOSIS — E785 Hyperlipidemia, unspecified: Secondary | ICD-10-CM

## 2019-06-25 NOTE — Patient Instructions (Addendum)
Visit Information  Goals Addressed            This Visit's Progress   . Chronic Care Management Pharmacy Care Plan       CARE PLAN ENTRY  Current Barriers:  . Chronic Disease Management support, education, and care coordination needs related to COPD, DM, HTN, HLD, Depression, Osteoarthritis   Hypertension . Pharmacist Clinical Goal(s): o Over the next 90 days, patient will work with PharmD and providers to maintain BP goal <140/90 . Current regimen:  o Diet and exercise management   . Interventions: o Requested patient to check blood pressure 1-2 times per week and record . Patient self care activities - Over the next 90 days, patient will: o Check BP 1-2 times per week, document, and provide at future appointments o Ensure daily salt intake < 2300 mg/Lance Powell  Hyperlipidemia . Pharmacist Clinical Goal(s): o Over the next 180 days, patient will work with PharmD and providers to maintain LDL goal < 100 . Current regimen:  o Atorvastatin 40mg  daily . Patient self care activities - Over the next 180 days, patient will: o Maintain cholesterol medication regimen.   Diabetes . Pharmacist Clinical Goal(s): o Over the next 90 days, patient will work with PharmD and providers to maintain A1c goal <7% . Current regimen:  o  Metformin 500mg  three times daily, pioglitazone 15mg  daily mid Lance Powell . Patient self care activities - Over the next 90 days, patient will: o Check blood sugar once daily, document, and provide at future appointments o Contact provider with any episodes of hypoglycemia  COPD . Pharmacist Clinical Goal(s) o Over the next 90 days, patient will work with PharmD and providers to reduce symptoms of COPD . Current regimen:  o Stiolto 2 puffs once daily, albuterol inhaler as needed, albuterol nebulizer as needed . Interventions: o Encouraged patient to use Stiolto daily o Completing patient assistance application for Stiolto to prevent barrier of patient taking Stiolto  daily due to cost . Patient self care activities - Over the next 90 days, patient will: o Maintain COPD medication regimen o Complete the appropriate sections of the patient assistance application for Stiolto  Medication management . Pharmacist Clinical Goal(s): o Over the next 90 days, patient will work with PharmD and providers to achieve optimal medication adherence . Current pharmacy: [facility] . Interventions o Comprehensive medication review performed. o Continue current medication management strategy . Patient self care activities - Over the next 90 days, patient will: o Focus on medication adherence by filling medications appropriately  o Take medications as prescribed o Report any questions or concerns to PharmD and/or provider(s)  Initial goal documentation        Lance Powell was given information about Chronic Care Management services today including:  1. CCM service includes personalized support from designated clinical staff supervised by his physician, including individualized plan of care and coordination with other care providers 2. 24/7 contact phone numbers for assistance for urgent and routine care needs. 3. Standard insurance, coinsurance, copays and deductibles apply for chronic care management only during months in which we provide at least 20 minutes of these services. Most insurances cover these services at 100%, however patients may be responsible for any copay, coinsurance and/or deductible if applicable. This service may help you avoid the need for more expensive face-to-face services. 4. Only one practitioner may furnish and bill the service in a calendar month. 5. The patient may stop CCM services at any time (effective at the end  of the month) by phone call to the office staff.  Patient agreed to services and verbal consent obtained.   The patient verbalized understanding of instructions provided today and agreed to receive a mailed  copy of patient instruction and/or educational materials. Telephone follow up appointment with pharmacy team member scheduled for: 07/29/2019  Melvenia Beam Lance Powell, PharmD Clinical Pharmacist [facility] [phone]    How to Take Your Blood Pressure You can take your blood pressure at home with a machine. You may need to check your blood pressure at home:  To check if you have high blood pressure (hypertension).  To check your blood pressure over time.  To make sure your blood pressure medicine is working. Supplies needed: You will need a blood pressure machine, or monitor. You can buy one at a drugstore or online. When choosing one:  Choose one with an arm cuff.  Choose one that wraps around your upper arm. Only one finger should fit between your arm and the cuff.  Do not choose one that measures your blood pressure from your wrist or finger. Your doctor can suggest a monitor. How to prepare Avoid these things for 30 minutes before checking your blood pressure:  Drinking caffeine.  Drinking alcohol.  Eating.  Smoking.  Exercising. Five minutes before checking your blood pressure:  Pee.  Sit in a dining chair. Avoid sitting in a soft couch or armchair.  Be quiet. Do not talk. How to take your blood pressure Follow the instructions that came with your machine. If you have a digital blood pressure monitor, these may be the instructions: 1. Sit up straight. 2. Place your feet on the floor. Do not cross your ankles or legs. 3. Rest your left arm at the level of your heart. You may rest it on a table, desk, or chair. 4. Pull up your shirt sleeve. 5. Wrap the blood pressure cuff around the upper part of your left arm. The cuff should be 1 inch (2.5 cm) above your elbow. It is best to wrap the cuff around bare skin. 6. Fit the cuff snugly around your arm. You should be able to place only one finger between the cuff and your arm. 7. Put the cord  inside the groove of your elbow. 8. Press the power button. 9. Sit quietly while the cuff fills with air and loses air. 10. Write down the numbers on the screen. 11. Wait 2-3 minutes and then repeat steps 1-10. What do the numbers mean? Two numbers make up your blood pressure. The first number is called systolic pressure. The second is called diastolic pressure. An example of a blood pressure reading is "120 over 80" (or 120/80). If you are an adult and do not have a medical condition, use this guide to find out if your blood pressure is normal: Normal  First number: below 120.  Second number: below 80. Elevated  First number: 120-129.  Second number: below 80. Hypertension stage 1  First number: 130-139.  Second number: 80-89. Hypertension stage 2  First number: 140 or above.  Second number: 70 or above. Your blood pressure is above normal even if only the top or bottom number is above normal. Follow these instructions at home:  Check your blood pressure as often as your doctor tells you to.  Take your monitor to your next doctor's appointment. Your doctor will: ? Make sure you are using it correctly. ? Make sure it is working right.  Make  sure you understand what your blood pressure numbers should be.  Tell your doctor if your medicines are causing side effects. Contact a doctor if:  Your blood pressure keeps being high. Get help right away if:  Your first blood pressure number is higher than 180.  Your second blood pressure number is higher than 120. This information is not intended to replace advice given to you by your health care provider. Make sure you discuss any questions you have with your health care provider. Document Revised: 12/20/2016 Document Reviewed: 06/16/2015 Elsevier Patient Education  2020 [place].  Blood Pressure Record Sheet To take your blood pressure, you will need a blood pressure machine. You can buy a blood pressure machine  (blood pressure monitor) at your clinic, drug store, or online. When choosing one, consider:  An automatic monitor that has an arm cuff.  A cuff that wraps snugly around your upper arm. You should be able to fit only one finger between your arm and the cuff.  A device that stores blood pressure reading results.  Do not choose a monitor that measures your blood pressure from your wrist or finger. Follow your health care provider's instructions for how to take your blood pressure. To use this form:  Get one reading in the morning (a.m.) before you take any medicines.  Get one reading in the evening (p.m.) before supper.  Take at least 2 readings with each blood pressure check. This makes sure the results are correct. Wait 1-2 minutes between measurements.  Write down the results in the spaces on this form.  Repeat this once a week, or as told by your health care provider.  Make a follow-up appointment with your health care provider to discuss the results. Blood pressure log Date: _______________________  a.m. _____________________(1st reading) _____________________(2nd reading)  p.m. _____________________(1st reading) _____________________(2nd reading) Date: _______________________  a.m. _____________________(1st reading) _____________________(2nd reading)  p.m. _____________________(1st reading) _____________________(2nd reading) Date: _______________________  a.m. _____________________(1st reading) _____________________(2nd reading)  p.m. _____________________(1st reading) _____________________(2nd reading) Date: _______________________  a.m. _____________________(1st reading) _____________________(2nd reading)  p.m. _____________________(1st reading) _____________________(2nd reading) Date: _______________________  a.m. _____________________(1st reading) _____________________(2nd reading)  p.m. _____________________(1st reading) _____________________(2nd reading) This  information is not intended to replace advice given to you by your health care provider. Make sure you discuss any questions you have with your health care provider. Document Revised: 03/07/2017 Document Reviewed: 01/07/2017 Elsevier Patient Education  [place].

## 2019-06-25 NOTE — Chronic Care Management (AMB) (Signed)
Chronic Care Management Pharmacy  Name: Lance Powell  MRN: [id number] DOB: 09-Oct-1929  Chief Complaint/ HPI  Kathlen Mody,  86 y.o. , male presents for their Initial CCM visit with the clinical pharmacist via telephone due to COVID-19 Pandemic. Marland Kitchen PCP : [name], PA-C  Their chronic conditions include: COPD, DM, HTN, HLD, Depression, Osteoarthritis  Office Visits: 06/22/19: Visit w/ [name], PA-C - Rib pain. Pt fell on old TV set in garage. Saw pulm, x-ray showed no fracture. Cytotec prescribed originally by GI MD. Sometimes uses 3L of O2. Discussed O2 use with pulm. Pt to update on how often he uses 3L. No med changes noted.   06/01/19: Visit w/ [name], PA-C - Pedal edema. R>L. Ordered chest xray, cmp, bnp, and RLE [place]. If negative for CHF and DVT treat for dependent edema. Elevated legs and use compression socks. (no chf or dvt)  05/12/19: Visit w/ [name], PA-C - Increase urinary frequency. Recent visit to ED for acute dyspnea. Continue Stiolto. Nebulizer machine? Referral to pulm. Labs ordered (cmp, cbc, urinalysis, psa).  03/30/19: Visit w/ [name], PA-C - R knee pain. Referral to sports med. Rash to groin prescribed nystatin. Mild loose stools recommend fiber and probiotics.   Consult Visit: 06/11/19: Pulmonary visit w/ Dr. Lamonte Sakai - Odette Horns for recent fall. Improvement per family support with addition of Stiolto. No med changes noted.   05/05/19: Sports Med visit w/ Dr. Raeford Razor - Follow up of R knee. Doing well since PT and knee brace. No med changes noted.  04/08/19: Sports Med visit w/ Dr. Raeford Razor - R knee pain. Pain associated with changes at origin of the MCL. Significant sprain on [place]. No effusion to suggest intra-articular process. Given samples of penssaid 2% with instructions to use pea size amount and rub gently. Hinged knee brace. Referral to PT. Consider injection if needed.  ED Visit: 05/10/19: [facility]. No med changes noted.   02/26/19: [facility] - Vertigo. Prescribed PRN meclizine.  Medications: Outpatient Encounter Medications as of 06/25/2019  Medication Sig Note  . albuterol (PROVENTIL) (2.5 MG/3ML) 0.083% nebulizer solution Take 3 mLs (2.5 mg total) by nebulization every 6 (six) hours as needed for wheezing or shortness of breath.   Marland Kitchen albuterol (VENTOLIN HFA) 108 (90 Base) MCG/ACT inhaler Inhale 2 puffs into the lungs every 6 (six) hours as needed for wheezing or shortness of breath.   Marland Kitchen aspirin 81 MG tablet Take 1 tablet (81 mg total) by mouth every morning. Stop for 1 week and then resume (Patient taking differently: Take 81 mg by mouth every morning. )   . atorvastatin (LIPITOR) 40 MG tablet 1 tab po q Marcedes Tech   . celecoxib (CELEBREX) 200 MG capsule TAKE 1 CAPSULE BY MOUTH ONCE DAILY IN THE EVENING   . cholecalciferol (VITAMIN D) 1000 UNITS tablet Take 2,000 Units by mouth 2 (two) times daily.    . Indacaterol Maleate (ARCAPTA NEOHALER) 75 MCG CAPS Place into inhaler and inhale.   . meclizine (ANTIVERT) 12.5 MG tablet Take 1 tablet (12.5 mg total) by mouth 3 (three) times daily as needed for dizziness.   . metFORMIN (GLUCOPHAGE) 500 MG tablet TAKE 1 TABLET BY MOUTH THREE TIMES DAILY   . misoprostol (CYTOTEC) 100 MCG tablet TAKE 1 TABLET BY MOUTH TWICE DAILY (AM&PM) AND TAKE 2 TABLETS AT BEDTIME   . Multiple Vitamins-Minerals (CENTRUM) tablet Take 1 tablet by mouth every morning. Reported  on 01/05/2015 06/25/2019: 500 IU vitamin D  . nystatin cream (MYCOSTATIN) Apply 1 application topically 2 (two) times daily.   . pioglitazone (ACTOS) 15 MG tablet Take 1 tablet by mouth once daily   . Tiotropium Bromide-Olodaterol (STIOLTO RESPIMAT) 2.5-2.5 MCG/ACT AERS Inhale 2 puffs into the lungs daily.   Marland Kitchen venlafaxine XR (EFFEXOR-XR) 37.5 MG 24 hr capsule TAKE 1 CAPSULE BY MOUTH ONCE DAILY WITH BREAKFAST   . vitamin C (ASCORBIC ACID) 500 MG tablet Take 500 mg by mouth every  morning.    . diclofenac sodium (VOLTAREN) 1 % GEL Apply 2 g topically 4 (four) times daily. (Patient not taking: Reported on 06/25/2019)   . enalapril (VASOTEC) 2.5 MG tablet Take 2.5 mg by mouth every morning. Reported on 07/12/2015   . glucose blood (BAYER CONTOUR TEST) test strip Use as instructed to check blood sugar twice a Abrey Bradway.  DX  E11.9   . OVER THE COUNTER MEDICATION Place 1-2 drops into both eyes daily as needed (dry red eyes.). Reported on 01/05/2015   . Tiotropium Bromide-Olodaterol (STIOLTO RESPIMAT) 2.5-2.5 MCG/ACT AERS Inhale 2 puffs into the lungs daily.   Marland Kitchen UNABLE TO FIND CPAP    No facility-administered encounter medications on file as of 06/25/2019.   SDOH Screenings   Alcohol Screen:   . Last Alcohol Screening Score (AUDIT):   Depression (PHQ2-9): Low Risk   . PHQ-2 Score: 0  Financial Resource Strain: Medium Risk  . Difficulty of Paying Living Expenses: Somewhat hard  Food Insecurity:   . Worried About Charity fundraiser in the Last Year:   . [place] in the Last Year:   Housing:   . Last Housing Risk Score:   Physical Activity:   . Days of Exercise per Week:   . Minutes of Exercise per Session:   Social Connections:   . Frequency of Communication with Friends and Family:   . Frequency of Social Gatherings with Friends and Family:   . Attends Religious Services:   . Active Member of Clubs or Organizations:   . Attends Archivist Meetings:   Marland Kitchen Marital Status:   Stress:   . Feeling of Stress :   Tobacco Use: Medium Risk  . Smoking Tobacco Use: Former Smoker  . Smokeless Tobacco Use: Never Used  Transportation Needs:   . Film/video editor (Medical):   Marland Kitchen Lack of Transportation (Non-Medical):      Current Diagnosis/Assessment:  Goals Addressed            This Visit's Progress   . Chronic Care Management Pharmacy Care Plan       CARE PLAN ENTRY  Current Barriers:  . Chronic Disease Management support, education, and care  coordination needs related to COPD, DM, HTN, HLD, Depression, Osteoarthritis   Hypertension . Pharmacist Clinical Goal(s): o Over the next 90 days, patient will work with PharmD and providers to maintain BP goal <140/90 . Current regimen:  o Diet and exercise management   . Interventions: o Requested patient to check blood pressure 1-2 times per week and record . Patient self care activities - Over the next 90 days, patient will: o Check BP 1-2 times per week, document, and provide at future appointments o Ensure daily salt intake < 2300 mg/Khalea Ventura  Hyperlipidemia . Pharmacist Clinical Goal(s): o Over the next 180 days, patient will work with PharmD and providers to maintain LDL goal < 100 . Current regimen:  o Atorvastatin 40mg  daily . Patient self  care activities - Over the next 180 days, patient will: o Maintain cholesterol medication regimen.   Diabetes . Pharmacist Clinical Goal(s): o Over the next 90 days, patient will work with PharmD and providers to maintain A1c goal <7% . Current regimen:  o  Metformin 500mg  three times daily, pioglitazone 15mg  daily mid Edvin Albus . Patient self care activities - Over the next 90 days, patient will: o Check blood sugar once daily, document, and provide at future appointments o Contact provider with any episodes of hypoglycemia  COPD . Pharmacist Clinical Goal(s) o Over the next 90 days, patient will work with PharmD and providers to reduce symptoms of COPD . Current regimen:  o Stiolto 2 puffs once daily, albuterol inhaler as needed, albuterol nebulizer as needed . Interventions: o Encouraged patient to use Stiolto daily o Completing patient assistance application for Stiolto to prevent barrier of patient taking Stiolto daily due to cost . Patient self care activities - Over the next 90 days, patient will: o Maintain COPD medication regimen o Complete the appropriate sections of the patient assistance application for Stiolto  Medication  management . Pharmacist Clinical Goal(s): o Over the next 90 days, patient will work with PharmD and providers to achieve optimal medication adherence . Current pharmacy: [facility] . Interventions o Comprehensive medication review performed. o Continue current medication management strategy . Patient self care activities - Over the next 90 days, patient will: o Focus on medication adherence by filling medications appropriately  o Take medications as prescribed o Report any questions or concerns to PharmD and/or provider(s)  Initial goal documentation       Social Hx:  In 49 he did basic training in [place]. Served 2 years.  Originally from [place], [place]. Moved to [place] due to job transfers.  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  Wife passed away 25-Jan-2015. Married over 32 years. He lives alone.  Watches TV and does sudoku puzzles.  Has 6 children. 1 in [place], [place] Has 7 grandchildren. Has 6 great grandsons.    The community he lives in has a club house.  He used to play cards twice weekly. He plans to start back playing cards.  Hasn't driven the car in 3-4 months.   COPD / Tobacco   Last spirometry score: 01/11/11 FVC 3.69 (83%) FEV1 2.04 (64%) FEV1/FVC 55%  Gold Grade: Gold 2 (FEV1 50-79%) (noting patient is on O2) Current COPD Classification:  B (high sx, <2 exacerbations/yr)  Eosinophil count:   Lab Results  Component Value Date/Time   EOSPCT 2.9 05/12/2019 03:56 PM  %                               Eos (Absolute):  Lab Results  Component Value Date/Time   EOSABS 0.2 05/12/2019 03:56 PM    Tobacco Status:  Social History   Tobacco Use  Smoking Status Former Smoker  . Packs/Colson Barco: 4.00  . Years: 30.00  . Pack years: 120.00  . Types: Cigarettes  . Quit date: 01/21/1978  . Years since quitting: 41.4  Smokeless Tobacco Never  Used    Patient has failed these meds in past: None noted  Patient is currently stable on the following medications: Stiolto 2 puffs daily, albuterol neb, albuterol inhaler Using maintenance inhaler regularly? No per fill history, yes per patient Frequency of rescue  inhaler use:  1-2x per week   How many times having to use 3L? About once or twice every 2 weeks at night with CPAP  Gets winded when walking. When he gets SOB will sit down and relax vs using medication.   States he doesn't take Stiolto daily due to cost. Takes 3-4 days a week. States he gets Arcapta from the pharmacy (do not see this in dispense report) Can hear patient wheezing on the phone. Just received nebulizer in May. Has not used yet.   Inhalers pt reports he has by color [employer] (uses most; states he uses it daily now, states it is expensive $106 for 1 inhaler) Red - Arcapta? Pt could not find this inhaler while on call Blue - Ventolin? Pt could not find this inhaler while on call  Called patient's main pharmacy and last fill dates noted below (fill dates not listed in Epic dispense report) Stiolto 10/19/2018 for [address] supply Arcapta most recent fill from 2013  We discussed:  The duplication in therapy of Stiolto and Arcapta. Advised patient to continue Stiolto and stop Arcapta.   Plan -Continue current medications  -Stop taking Arcapta -Will fill out PAP application for Stiolto and mail to patient to complete his portions (limited transportation due to depending on children to drive him)  Diabetes   Recent Relevant Labs: Lab Results  Component Value Date/Time   HGBA1C 6.8 (H) 04/08/2019 03:25 PM   HGBA1C 6.8 (H) 12/09/2018 01:53 PM   MICROALBUR 0.5 09/13/2016 02:13 PM   MICROALBUR 1.1 09/28/2015 08:52 AM    A1c goal <7%  Checking BG: Daily  Recent FBG Readings: 119 110 112 119 115 116 102 141 113 120  153 120 112 Average 119.3  Patient has failed these meds in past: invokana  (cost) Patient is currently controlled on the following medications: Metformin 500mg  three times daily, pioglitazone 15mg  daily mid Anureet Bruington  Last diabetic Foot exam:  Lab Results  Component Value Date/Time   HMDIABEYEEXA No Retinopathy 12/21/2015 12:00 AM    Last diabetic Eye exam: No results found for: HMDIABFOOTEX   We discussed: Diabetes goals (a1c <7%, FBG 80-130, PPBG <180)  Plan -Continue current medications   Hypertension   CMP Latest Ref Rng & Units 06/01/2019 05/12/2019 04/08/2019  Glucose 65 - 99 mg/dL 161(H) 126(H) 92  BUN 7 - 25 mg/dL 18 14 15   Creatinine 0.70 - 1.11 mg/dL 1.20(H) 1.16 1.17  Sodium 135 - 146 mmol/L 141 141 140  Potassium 3.5 - 5.3 mmol/L 4.7 4.6 4.5  Chloride 98 - 110 mmol/L 105 103 103  CO2 20 - 32 mmol/L 27 27 31   Calcium 8.6 - 10.3 mg/dL 10.5(H) 10.5 10.4  Total Protein 6.1 - 8.1 g/dL 7.0 6.7 6.5  Total Bilirubin 0.2 - 1.2 mg/dL 0.5 0.5 0.5  Alkaline Phos 39 - 117 U/L - 77 77  AST 10 - 35 U/L 18 19 18   ALT 9 - 46 U/L 19 17 23    Kidney Function Lab Results  Component Value Date/Time   CREATININE 1.20 (H) 06/01/2019 04:24 PM   CREATININE 1.16 05/12/2019 03:56 PM   CREATININE 1.17 04/08/2019 03:25 PM   CREATININE 1.06 09/13/2016 02:15 PM   GFR 59.47 (L) 05/12/2019 03:56 PM   GFRNONAA >60 09/14/2018 04:57 PM   GFRNONAA 44 (L) 04/25/2016 12:44 PM   GFRAA >60 09/14/2018 04:57 PM   GFRAA 51 (L) 04/25/2016 12:44 PM   BP today is:  Unable to assess due to phone  visit   Office blood pressures are  BP Readings from Last 3 Encounters:  06/22/19 140/70  06/11/19 (!) 142/62  06/01/19 115/62   Blood Pressure Goal <140/90  Patient has failed these meds in the past: None noted  Patient is currently controlled on the following medications: None  Patient checks BP at home infrequently  Patient home BP readings are ranging: Unable to assess  We discussed blood pressure goals  Plan -Continue current medications  -Check blood pressure 1-2 times per  week and record   Hyperlipidemia   Lipid Panel     Component Value Date/Time   CHOL 135 09/07/2018 0830   TRIG 85.0 09/07/2018 0830   HDL 41.80 09/07/2018 0830   LDLCALC 76 09/07/2018 0830    LDL goal <100  The ASCVD Risk score Mikey Bussing DC Jr., et al., 2013) failed to calculate for the following reasons:   The 2013 ASCVD risk score is only valid for ages 10 to 57   Patient has failed these meds in past: None noted  Patient is currently controlled on the following medications: Atorvastatin 40mg  daily  We discussed:  LDL goal  Plan -Continue current medications  Depression    Patient has failed these meds in past: None noted  Patient is currently controlled on the following medications: Venlafaxine 37.5mg  daily  "At my age I don't let anything bother me" "I don't feel depressed at all"  Plan -Continue current medications   Osteoarthritis    Patient has failed these meds in past: None noted  Patient is currently controlled on the following medications: Celecoxib 200mg  daily PM, diclofenac 1% gel 2g 4times daily, Misoprostol 100mg  #1AM, #1PM, #2 at bedtime, tylenol 500mg  as needed  Uses tylenol once every 2-3 weeks  Plan -Continue current medications   Miscellaneous Meds Aspirin 81mg  (atherosclerosis noted on 05/10/19 chest x ray) Vitamin D Meclizine 12.5 Multivitamin Nystatin Vitamin C  Meds to D/C from list  Indacaterol (Arcapta Neohaler) (change in therapy) Stiolto (duplicate) Diclofenac (not taking) Enalapril (not taking)

## 2019-07-01 ENCOUNTER — Other Ambulatory Visit: Payer: Self-pay | Admitting: Medical

## 2019-07-07 DIAGNOSIS — G4733 Obstructive sleep apnea (adult) (pediatric): Secondary | ICD-10-CM | POA: Diagnosis not present

## 2019-07-07 DIAGNOSIS — J449 Chronic obstructive pulmonary disease, unspecified: Secondary | ICD-10-CM | POA: Diagnosis not present

## 2019-07-07 DIAGNOSIS — J9610 Chronic respiratory failure, unspecified whether with hypoxia or hypercapnia: Secondary | ICD-10-CM | POA: Diagnosis not present

## 2019-07-08 DIAGNOSIS — H02102 Unspecified ectropion of right lower eyelid: Secondary | ICD-10-CM | POA: Diagnosis not present

## 2019-07-08 DIAGNOSIS — Z961 Presence of intraocular lens: Secondary | ICD-10-CM | POA: Diagnosis not present

## 2019-07-08 DIAGNOSIS — E119 Type 2 diabetes mellitus without complications: Secondary | ICD-10-CM | POA: Diagnosis not present

## 2019-07-08 DIAGNOSIS — H524 Presbyopia: Secondary | ICD-10-CM | POA: Diagnosis not present

## 2019-07-20 ENCOUNTER — Encounter: Payer: Medicare Other | Attending: Medical | Admitting: Nutrition

## 2019-07-20 ENCOUNTER — Other Ambulatory Visit: Payer: Self-pay

## 2019-07-20 DIAGNOSIS — E119 Type 2 diabetes mellitus without complications: Secondary | ICD-10-CM | POA: Insufficient documentation

## 2019-07-20 DIAGNOSIS — E1121 Type 2 diabetes mellitus with diabetic nephropathy: Secondary | ICD-10-CM | POA: Diagnosis not present

## 2019-07-21 NOTE — Progress Notes (Signed)
Patient was identified by Name and DOB.  He is here with his daughter, requesting help with diet.  Hgb A1C has gone up despite good BS readings (fastings only) He is a retired Chief Financial Officer, and daughter says likes facts/reasons for doing things.   His wife died 3 years ago and daughter lives away, but helps with fixing meals and putting them in the freezer for him.  He cooks/heat up, but usually it is simple heating in microwave, or gifts from friends neighbor--which is usually something sweet, like deserts. SBGM: FBSs 106-119.  Not testing after meals.   Diabetes Medications:  Actos, Metformin- 1,ac meals Exercise: uses walker due to unsteadiness on his feet Typical day: 8AM: up eats breakfast;  Large bowl of double raisin bran, with banana, 2% milk, 4 ounces of orange juice(3-4 days/wk).   8:45: goes back to bed until 11 AM-12PM. 12-1: Lunch Peanut butter and jelly sandwich or cheese sandwhich (3 ounces), with apple, water or gatorade 0 to drink, or soup with 12 saltine crackers. 3-4PM: carton or regular jello, 4-6 oreo cookies  drinks 32 ounces of sweetended lemonade all afternoon 6-7:30PM: supper: chicken breast-baked ususally, or Hungry Man supper with green beans and mash potatoes with desert, water to drink 9PM Cheetoes, oreo cookies, with gatorade 0,  11:30-12AM: bed.  Will ususally sleep all night, with no eating during this time. Discussion: 1.  Discussed pathophysiology of type 2 diabetes,  And how  glucose comes from all foods and the effects of each major food group (protein, carbs and fats) has on blood sugar, causing resulting defects of insulin resistance and reduction over time of insulin producing cells. 2.  Dicussed the need for balanced meals, and watching the amounts of each of the 3 food groups at each meal.  3.  Discussed what a meal like his breakfast(which is all carbs), has on blood sugar, and suggested he test his blood sugar 2hr. Pc, and I will call him tomorrow to see what  this is.  He will do this. 4.  Discussed other options for breakfast and sweentened lemonade  5.  Discussed that extra calories are coming from snacking (carbs-sugar and fat), and other options for this. 6.  Discussed the importance of exercise for blood sugar, BP control, as well as better balance.   Plan: 1.  Gave list of other breakfast options 2.  Gave list of other snack options 3.  Test blood sugar tomorrow 2hr. pc above breakfast to see what effect all of that carb has on blood sugar.   4.  Stop all sweet drinks and use water flavored with crystal light flavor packits. 5.  Do chair exercises given for 10 min. 3X/day.  Work up to 15 min. 3X/day

## 2019-07-22 ENCOUNTER — Telehealth: Payer: Self-pay | Admitting: Nutrition

## 2019-07-22 NOTE — Patient Instructions (Signed)
1.  Stop cereal, milk, fruit and juice breakfast.  Eat from other breakfast list given. 2.  Use snack options from list given for less than 100 calories  3.  Test blood sugar tomorrow 2hr. pc above breakfast to see what effect all of that carb has on blood sugar.   4.  Stop all sweet drinks and use water flavored with crystal light flavor packits. 5.  Do chair exercises given for 10 min. 3X/day.  Work up to 15 min. 3X/day

## 2019-07-22 NOTE — Telephone Encounter (Signed)
Daughter called saying that father's 2hr. pc B reading was 191 without the banana and juice.  Discussed that the 1hr.pc reading would have been over 250.  Daughter said that father is convinced that he needs to change his breakfast and will choose from list given

## 2019-07-29 ENCOUNTER — Other Ambulatory Visit: Payer: Self-pay | Admitting: Medical

## 2019-07-29 ENCOUNTER — Telehealth: Payer: Medicare Other

## 2019-09-13 ENCOUNTER — Other Ambulatory Visit: Payer: Self-pay

## 2019-09-13 ENCOUNTER — Ambulatory Visit: Payer: Medicare Other | Admitting: Emergency Medicine

## 2019-09-13 ENCOUNTER — Encounter: Payer: Self-pay | Admitting: Emergency Medicine

## 2019-09-13 VITALS — BP 130/84 | HR 100 | Temp 97.0°F | Ht 72.0 in | Wt 252.0 lb

## 2019-09-13 DIAGNOSIS — G4733 Obstructive sleep apnea (adult) (pediatric): Secondary | ICD-10-CM

## 2019-09-13 DIAGNOSIS — J9611 Chronic respiratory failure with hypoxia: Secondary | ICD-10-CM | POA: Diagnosis not present

## 2019-09-13 DIAGNOSIS — J438 Other emphysema: Secondary | ICD-10-CM

## 2019-09-13 MED ORDER — GUAIFENESIN ER 600 MG PO TB12
600.0000 mg | ORAL_TABLET | Freq: Two times a day (BID) | ORAL | 2 refills | Status: DC
Start: 2019-09-13 — End: 2021-05-07

## 2019-09-13 NOTE — Assessment & Plan Note (Signed)
Wear your CPAP every night as you have been wearing it.

## 2019-09-13 NOTE — Assessment & Plan Note (Addendum)
Big issue here is medical compliance.  He has poor memory and forgets his Stiolto.  States that is been using it through a spacer although I am not sure how they connect together.  We need to work on compliance and work on using albuterol as needed.  He is also having difficulty with mucus clearance, cough so we will try mucolytic's and flutter valve.  Please start using your Stiolto 2 puffs once daily every day.  Use this through a spacer.  You need to be reliable with this medicine because it is a maintenance medication to prevent you from having breathing difficulty. Keep albuterol available either 1 nebulizer treatment or 2 puffs up to every 4 hours if you need it for shortness of breath, chest tightness, wheezing. Start guaifenesin 600 mg twice a day (generic Mucinex) Drink plenty of water Start using a flutter valve, 10 puffs twice a day to clear mucus. Follow with Dr. Lamonte Sakai in 2 months or sooner if you have any problems.

## 2019-09-13 NOTE — Assessment & Plan Note (Signed)
Wear your oxygen at 2 L/min as you have been doing.

## 2019-09-13 NOTE — Progress Notes (Signed)
Subjective:    Patient ID: Lance Powell, male    DOB: 25-May-1929, 86 y.o.   MRN: [id number]  HPI  ROV 10/16/2018 --[name] returns for follow-up.  He is 37 and has a history of significant former tobacco (120 pack years), COPD, obstructive sleep apnea on CPAP with oxygen bled into his system.  He has hypoxemic respiratory failure and uses 2 L/min with exertion.  Based on our discussions last year he has not been on any scheduled bronchodilator therapy.  We hope to get him a portable oxygen concentrator last year but there was difficulty obtaining because he also needs home concentrator to bleed oxygen into his CPAP. Today he reports that his exertional tolerance is good, but his family notes that this doesn't seem true. Every evening he produces mucous, gray. He has had some dizziness. He has been working with PT, and has noticed some desats associated with this.   ROV 06/11/19 --86 year old man with severe COPD, OSA on CPAP plus O2, chronic hypoxemic respiratory failure on 2 L/min with exertion.  I tried starting Stiolto last September to see if he would get benefit.  He reports that he had a fall in the last 2 days that resulted in impact to his L chest, hard to take a deep breath. He is unsure whether he has benefited from [place] - but his daughter says it has.  COVID vaccine up to date. He just added albuterol nebs to have available.  He underwent bilateral lower extremity Dopplers that were negative, chest x-ray on 06/01/2019 to evaluate lower extremity edema.  I reviewed the chest x-ray, it shows some emphysematous changes, streaky lower lobe interstitial changes.  No overt infiltrates  MDM: Reviewed chest x-ray and Doppler ultrasound 06/01/2019 Reviewed family medicine office visit 06/01/2019  ROV 09/13/19 --this is a follow-up visit for 86 year old gentleman with severe COPD, OSA on CPAP and oxygen, chronic hypoxemic respiratory failure.  I last saw him 3 months ago.  We have been  managing him on Stiolto, seems that he may have benefited.  He had experienced a fall and had persistent left flank pain so a performed chest x-ray that did not show any fractures. His flank pain has improved. He is using Stiolto with a spacer, but often forgets it. He has albuterol nebs, not using right now. Quite sedentary. He does wear his CPAP reliably.  COVID vaccine up to date.    No flowsheet data found.  Review of Systems As per HPI      Objective:   Physical Exam Vitals:   09/13/19 1344  BP: 130/84  Pulse: 100  Temp: (!) 97 F (36.1 C)  SpO2: 93%  Weight: 252 lb (114.3 kg)  Height: 6' (1.829 m)   Gen: Pleasant, overwt man, in no distress,  normal affect  ENT: No lesions,  mouth clear,  oropharynx clear, no postnasal drip  Neck: No JVD, no stridor  Lungs: No use of accessory muscles, clear on a normal breath, loud wheeze on a forced expiration.   Cardiovascular: RRR, heart sounds normal, no murmur or gallops, no peripheral edema  Musculoskeletal: No deformities, some tenderness left costal margin with palpation  Neuro: alert, awake, a bit tangential, poor memory. Depends on his daughter to answer questions.   Skin: Warm, no lesions or rashes      Assessment & Plan:  COPD (chronic obstructive pulmonary disease) ([place]) Big issue here is medical compliance.  He has poor memory and forgets his Stiolto.  States  that is been using it through a spacer although I am not sure how they connect together.  We need to work on compliance and work on using albuterol as needed.  He is also having difficulty with mucus clearance, cough so we will try mucolytic's and flutter valve.  Please start using your Stiolto 2 puffs once daily every day.  Use this through a spacer.  You need to be reliable with this medicine because it is a maintenance medication to prevent you from having breathing difficulty. Keep albuterol available either 1 nebulizer treatment or 2 puffs up to every 4  hours if you need it for shortness of breath, chest tightness, wheezing. Start guaifenesin 600 mg twice a day (generic Mucinex) Drink plenty of water Start using a flutter valve, 10 puffs twice a day to clear mucus. Follow with Dr. Lamonte Sakai in 2 months or sooner if you have any problems.  Chronic respiratory failure ([place]) Wear your oxygen at 2 L/min as you have been doing.   Obstructive sleep apnea Wear your CPAP every night as you have been wearing it.  [name], MD, PhD 09/13/2019, 2:12 PM [facility] [phone] or if no answer [phone]

## 2019-09-13 NOTE — Patient Instructions (Signed)
Please start using your Stiolto 2 puffs once daily every day.  Use this through a spacer.  You need to be reliable with this medicine because it is a maintenance medication to prevent you from having breathing difficulty. Keep albuterol available either 1 nebulizer treatment or 2 puffs up to every 4 hours if you need it for shortness of breath, chest tightness, wheezing. Wear your oxygen at 2 L/min as you have been doing. Wear your CPAP every night as you have been wearing it. Start guaifenesin 600 mg twice a day (generic Mucinex) Drink plenty of water Start using a flutter valve, 10 puffs twice a day to clear mucus. Follow with Dr. Lamonte Sakai in 2 months or sooner if you have any problems.

## 2019-09-13 NOTE — Addendum Note (Signed)
Addended byDocia Barrier on: 09/13/2019 02:25 PM   Modules accepted: Orders

## 2019-09-28 ENCOUNTER — Encounter: Payer: Self-pay | Admitting: Neurology

## 2019-09-28 ENCOUNTER — Other Ambulatory Visit: Payer: Medicare Other

## 2019-09-28 ENCOUNTER — Other Ambulatory Visit: Payer: Self-pay

## 2019-09-28 ENCOUNTER — Ambulatory Visit: Payer: Medicare Other | Admitting: Neurology

## 2019-09-28 VITALS — BP 144/79 | HR 115 | Ht 72.0 in | Wt 254.0 lb

## 2019-09-28 DIAGNOSIS — G3184 Mild cognitive impairment, so stated: Secondary | ICD-10-CM

## 2019-09-28 NOTE — Patient Instructions (Signed)
1. Bloodwork for TSH, B12  2. Follow-up in 6 months, call for any changes  FALL PRECAUTIONS: Be cautious when walking. Scan the area for obstacles that may increase the risk of trips and falls. When getting up in the mornings, sit up at the edge of the bed for a few minutes before getting out of bed. Consider elevating the bed at the head end to avoid drop of blood pressure when getting up. Walk always in a well-lit room (use night lights in the walls). Avoid area rugs or power cords from appliances in the middle of the walkways. Use a walker or a cane if necessary and consider physical therapy for balance exercise. Get your eyesight checked regularly.  FINANCIAL OVERSIGHT: Supervision, especially oversight when making financial decisions or transactions is also recommended as difficulties arise.   HOME SAFETY: Consider the safety of the kitchen when operating appliances like stoves, microwave oven, and blender. Consider having supervision and share cooking responsibilities until no longer able to participate in those. Accidents with firearms and other hazards in the house should be identified and addressed as well.  ABILITY TO BE LEFT ALONE: If patient is unable to contact 911 operator, consider using LifeLine, or when the need is there, arrange for someone to stay with patients. Smoking is a fire hazard, consider supervision or cessation. Risk of wandering should be assessed by caregiver and if detected at any point, supervision and safe proof recommendations should be instituted.  MEDICATION SUPERVISION: Inability to self-administer medication needs to be constantly addressed. Implement a mechanism to ensure safe administration of the medications.  RECOMMENDATIONS FOR ALL PATIENTS WITH MEMORY PROBLEMS: 1. Continue to exercise (Recommend 30 minutes of walking everyday, or 3 hours every week) 2. Increase social interactions - continue going to [place] and enjoy social gatherings with friends and  family 3. Eat healthy, avoid fried foods and eat more fruits and vegetables 4. Maintain adequate blood pressure, blood sugar, and blood cholesterol level. Reducing the risk of stroke and cardiovascular disease also helps promoting better memory. 5. Avoid stressful situations. Live a simple life and avoid aggravations. Organize your time and prepare for the next day in anticipation. 6. Sleep well, avoid any interruptions of sleep and avoid any distractions in the bedroom that may interfere with adequate sleep quality 7. Avoid sugar, avoid sweets as there is a strong link between excessive sugar intake, diabetes, and cognitive impairment We discussed the Mediterranean diet, which has been shown to help patients reduce the risk of progressive memory disorders and reduces cardiovascular risk. This includes eating fish, eat fruits and green leafy vegetables, nuts like almonds and hazelnuts, walnuts, and also use olive oil. Avoid fast foods and fried foods as much as possible. Avoid sweets and sugar as sugar use has been linked to worsening of memory function.  There is always a concern of gradual progression of memory problems. If this is the case, then we may need to adjust level of care according to patient needs. Support, both to the patient and caregiver, should then be put into place.
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
## 2019-09-29 ENCOUNTER — Telehealth: Payer: Self-pay

## 2019-09-29 LAB — TSH: TSH: 3.35 u[IU]/mL (ref 0.35–4.50)

## 2019-09-29 LAB — VITAMIN B12: Vitamin B-12: 571 pg/mL (ref 211–911)

## 2019-09-29 NOTE — Telephone Encounter (Signed)
Spoke with pt and informed him that bloodwork was normal. No need for supplements, but also no harm in taking a daily multivitamin pt verbalized understanding

## 2019-09-29 NOTE — Telephone Encounter (Signed)
-----   Message from Cameron Sprang, MD sent at 09/29/2019  9:00 AM EDT ----- Pls let patient/daughter know the bloodwork was normal. No need for supplements, but also no harm in taking a daily multivitamin (daughter was asking if any vitamins can help). Thanks

## 2019-10-06 ENCOUNTER — Other Ambulatory Visit: Payer: Self-pay | Admitting: Medical

## 2019-10-11 ENCOUNTER — Telehealth: Payer: Self-pay | Admitting: Pharmacist

## 2019-10-11 NOTE — Progress Notes (Addendum)
Chronic Care Management Pharmacy Assistant   Name: Lance Powell  MRN: [id number] DOB: 02/19/1929  Reason for Encounter: Disease State  Patient Questions:  1.  Have you seen any other providers since your last visit?   2.  Any changes in your medicines or health?   PCP : [name], PA-C   Their chronic conditions include: COPD, DM, HTN, HLD, Depression, Osteoarthritis  Consults: 09-28-19 (Neurology) Patient presented in office with Lance Powell for memory loss.   His neurological exam is largely non-focal, SLUMS score 25/30. 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 Head CT in 2020 showed diffuse atrophy and chronic microvascular disease. He is overall managing complex tasks adequately. We discussed risks and benefits of starting cholinesterase inhibitors such as Donepezil, and have agreed to hold off. Check TSH and B12. Discussed the importance of control of vascular risk factors, physical exercise, and brain stimulation exercises for brain health. We discussed having his daughters check behind him on medications. He does not drive. He was advised to start using his hearing aids. Follow-up in 6 months, they know to call for any changes. 09-13-19 [facility]) Patient presented in office with Lance Powell for a follow up.  Provider states patient  has a history of significant former tobacco (120 pack years), COPD, obstructive sleep apnea on CPAP with oxygen bled into his system.  He has hypoxemic respiratory failure and uses 2 L/min with exertion.  Based on our discussions last year he has not been on any scheduled bronchodilator therapy.  We hope to get him a portable oxygen concentrator last year but there was difficulty obtaining because he also needs home concentrator to bleed oxygen into his CPAP.  Patient reported that his exertional tolerance is good, but his family notes that this doesn't seem true. Every evening family stated  patient produces mucous, gray. He has had some dizziness. He has been working with PT, and has noticed some desats associated with this. Provider recommended start using your Stiolto 2 puffs once daily every day.  Use this through a spacer.  You need to be reliable with this medicine because it is a maintenance medication to prevent you from having breathing difficulty. Keep albuterol available either 1 nebulizer treatment or 2 puffs up to every 4 hours if you need it for shortness of breath, chest tightness, wheezing.  Start guaifenesin 600 mg twice a day (generic Mucinex)  Drink plenty of water  Start using a flutter valve, 10 puffs twice a day to clear mucus.  Wear your oxygen at 2 L/min as you have been doing.  Wear your CPAP every night as you have been wearing it.  Follow with Dr. Lamonte Sakai in 2 months or sooner if you have any problems. 07-20-19 (Nutrition)  Patient presented in office with Lance Powell for diet plan.  Provider recommended stop cereal, milk, fruit and juice breakfast.  Eat from other breakfast list given.  Use snack options from list given for less than 100 calories. Test blood sugar tomorrow 2hr. pc above breakfast to see what effect all of that carb has on blood sugar.  Stop all sweet drinks and use water flavored with crystal light flavor packits. Do chair exercises given for 10 min. 3X/day.  Work up to 15 min. 3X/day 07-08-19 ([place])  Patient presented in office with Lance Powell for eye exam.The patient reports symptoms in both eyes. The patient reports difficulty focusing. The patient reports  the symptoms are moderate. Onset: Since cataract surgery . The symptoms are stable. The patient's symptoms include: dryness (Light sensitivity ). Interventions: OTC readers. The symptoms had signifcant improvement with treatment. Review of eye symptoms reveals: Negative for flashes, floaters and eye pain. Provider recommended use Artificial tears as needed.  (ie. Systane or Refresh) Provider stated he can refer you as needed to have your lower lids corrected.  Copy of glasses RX given to patient   No Office visits or Hospitalizations since last CCM visit on 06-25-19.  Allergies:  No Known Allergies  Medications: Outpatient Encounter Medications as of 10/11/2019  Medication Sig Note   albuterol (PROVENTIL) (2.5 MG/3ML) 0.083% nebulizer solution Take 3 mLs (2.5 mg total) by nebulization every 6 (six) hours as needed for wheezing or shortness of breath.    albuterol (VENTOLIN HFA) 108 (90 Base) MCG/ACT inhaler Inhale 2 puffs into the lungs every 6 (six) hours as needed for wheezing or shortness of breath.    aspirin 81 MG tablet Take 1 tablet (81 mg total) by mouth every morning. Stop for 1 week and then resume (Patient taking differently: Take 81 mg by mouth every morning. )    atorvastatin (LIPITOR) 40 MG tablet 1 tab po q day    celecoxib (CELEBREX) 200 MG capsule Take 1 capsule by mouth once daily    cholecalciferol (VITAMIN D) 1000 UNITS tablet Take 2,000 Units by mouth 2 (two) times daily.     diclofenac sodium (VOLTAREN) 1 % GEL Apply 2 g topically 4 (four) times daily. (Patient not taking: Reported on 09/28/2019)    enalapril (VASOTEC) 2.5 MG tablet Take 2.5 mg by mouth every morning. Reported on 07/12/2015    glucose blood (BAYER CONTOUR TEST) test strip Use as instructed to check blood sugar twice a day.  DX  E11.9    guaiFENesin (MUCINEX) 600 MG 12 hr tablet Take 1 tablet (600 mg total) by mouth 2 (two) times daily.    Indacaterol Maleate (ARCAPTA NEOHALER) 75 MCG CAPS Place into inhaler and inhale.    meclizine (ANTIVERT) 12.5 MG tablet Take 1 tablet (12.5 mg total) by mouth 3 (three) times daily as needed for dizziness.    metFORMIN (GLUCOPHAGE) 500 MG tablet TAKE 1 TABLET BY MOUTH THREE TIMES DAILY    misoprostol (CYTOTEC) 100 MCG tablet TAKE 1 TABLET BY MOUTH TWICE DAILY (AM&PM) AND TAKE 2 TABLETS AT BEDTIME    Multiple Vitamins-Minerals  (CENTRUM) tablet Take 1 tablet by mouth every morning. Reported on 01/05/2015 06/25/2019: 500 IU vitamin D   nystatin cream (MYCOSTATIN) Apply 1 application topically 2 (two) times daily.    OVER THE COUNTER MEDICATION Place 1-2 drops into both eyes daily as needed (dry red eyes.). Reported on 01/05/2015    pioglitazone (ACTOS) 15 MG tablet Take 1 tablet by mouth once daily    Tiotropium Bromide-Olodaterol (STIOLTO RESPIMAT) 2.5-2.5 MCG/ACT AERS Inhale 2 puffs into the lungs daily.    Tiotropium Bromide-Olodaterol (STIOLTO RESPIMAT) 2.5-2.5 MCG/ACT AERS Inhale 2 puffs into the lungs daily.    UNABLE TO FIND CPAP    venlafaxine XR (EFFEXOR-XR) 37.5 MG 24 hr capsule TAKE 1 CAPSULE BY MOUTH ONCE DAILY WITH BREAKFAST    vitamin C (ASCORBIC ACID) 500 MG tablet Take 500 mg by mouth every morning.     No facility-administered encounter medications on file as of 10/11/2019.    Current Diagnosis: Patient Active Problem List   Diagnosis Date Noted   Accident due to mechanical fall without injury 06/11/2019  Sprain of medial collateral ligament of right knee 04/08/2019   Prepatellar bursitis of right knee 04/08/2019   Chronic respiratory failure ([place]) 11/06/2017   Hip pain 07/27/2014   Abrasion 07/27/2014   Insomnia 06/27/2014   Chronic cholecystitis 06/16/2014   Coccyx pain 03/30/2014   Diabetes mellitus type II, controlled ([place]) 03/08/2014   Depression 03/08/2014   GERD (gastroesophageal reflux disease) 03/08/2014   Hyperlipidemia 03/08/2014   Abdominal pain    Diverticulitis of large intestine without perforation or abscess without bleeding    Blood poisoning    Calculus of gallbladder with acute cholecystitis 02/27/2014   Acute cholecystitis    Diverticulitis 02/25/2014   Urinary retention 02/25/2014   Hypertension 02/25/2014   SBO (small bowel obstruction) ([place]) 02/25/2014   Sepsis ([place]) 02/25/2014   Cholecystitis 02/25/2014   Slow transit constipation 02/25/2014   Seborrheic  dermatitis of scalp 11/24/2013   Diarrhea 07/12/2013   Contusion of multiple sites 05/24/2013   ED (erectile dysfunction) of organic origin 05/06/2013   Seborrheic keratosis 05/06/2013   Cough 02/17/2013   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   Acute maxillary sinusitis 10/30/2012   Allergic rhinitis 06/27/2012   Hypertrophic and atrophic condition of skin 06/27/2012   Increased frequency of urination 06/27/2012   Lower urinary tract infectious disease 06/27/2012   Major depressive disorder, single episode, unspecified 06/27/2012   Malaise and fatigue 06/27/2012   Psychosexual dysfunction with inhibited sexual excitement 06/27/2012   Personal history of colonic polyps 01/06/2012   Special screening for malignant neoplasms, colon 11/15/2011   Pruritus ani 11/15/2011   COPD (chronic obstructive pulmonary disease) ([place]) 01/11/2011   DOE (dyspnea on exertion) 01/01/2011   Fissure in ano 12/21/2010   HYPERLIPIDEMIA 01/11/2009   Obstructive sleep apnea 01/11/2009   ARTHRITIS, RHEUMATOID 01/11/2009   OSTEOARTHRITIS 01/11/2009    Goals Addressed   None    Current COPD regimen:Stiolto 2 puffs once daily, albuterol inhaler as needed, albuterol nebulizer as needed  No flowsheet data found. Any recent hospitalizations or ED visits since last visit with CPP? No Denies COPD symptoms. What recent interventions/DTPs have been made by any provider to improve breathing since last visit: None Have you had exacerbation/flare-up since last visit? No What do you do when you are short of breath?  Rest  Respiratory Devices/Equipment Do you have a nebulizer? Yes Do you use a Peak Flow Meter? Yes Do you use a maintenance inhaler? Yes How often do you forget to use your daily inhaler? Never Do you use a rescue inhaler? Yes How often do you use your rescue inhaler?  3-5x times per week Do you use a spacer with your inhaler? Yes  Adherence Review: Does the patient have >5 day gap between last estimated  fill date for maintenance inhaler medications? No CPP Please Review  Recent Relevant Labs: Lab Results  Component Value Date/Time   HGBA1C 6.8 (H) 04/08/2019 03:25 PM   HGBA1C 6.8 (H) 12/09/2018 01:53 PM   MICROALBUR 0.5 09/13/2016 02:13 PM   MICROALBUR 1.1 09/28/2015 08:52 AM    Kidney Function Lab Results  Component Value Date/Time   CREATININE 1.20 (H) 06/01/2019 04:24 PM   CREATININE 1.16 05/12/2019 03:56 PM   CREATININE 1.17 04/08/2019 03:25 PM   CREATININE 1.06 09/13/2016 02:15 PM   GFR 59.47 (L) 05/12/2019 03:56 PM   GFRNONAA >60 09/14/2018 04:57 PM   GFRNONAA 44 (L) 04/25/2016 12:44 PM   GFRAA >60 09/14/2018 04:57 PM   GFRAA 51 (L) 04/25/2016 12:44 PM  Current antihyperglycemic regimen:   Metformin 500mg  three times daily, pioglitazone 15mg  daily mid day What recent interventions/DTPs have been made to improve glycemic control:  On 07-20-19 Nutrition recommended patient to stop cereal, milk, fruit and juice breakfast.  Eat from other breakfast list given.  Use snack options from list given for less than 100 calories.  Test blood sugar tomorrow 2hr. pc above breakfast to see what effect all of that carb has on blood sugar.  Stop all sweet drinks and use water flavored with crystal light flavor packits. Do chair exercises given for 10 min. 3X/day.  Work up to 15 min. 3X/day Have there been any recent hospitalizations or ED visits since last visit with CPP? No Patient denies hypoglycemic symptoms. Patient denies hyperglycemic symptoms. How often are you checking your blood sugar? once daily What are your blood sugars ranging?  Fasting: 98-121 During the week, how often does your blood glucose drop below 70? Never Are you checking your feet daily/regularly? Yes  Adherence Review: Is the patient currently on a STATIN medication? Yes Atorvastatin 40mg  daily Is the patient currently on ACE/ARB medication? No  Does the patient have >5 day gap between last estimated fill  dates? No   Follow-Up:  Pharmacist Review   [place] Shannon, [place] [facility] [place] Pharmacist Assistant [phone]   Reviewed by: De Blanch, PharmD Clinical Pharmacist [facility] [phone]    I have collaborated with the care management provider regarding care management and care coordination activities outlined in this encounter and have reviewed this encounter including documentation in the note and care plan. I am certifying that I agree with the content of this note and encounter as supervising provider.  [name], PA-C

## 2019-11-01 ENCOUNTER — Telehealth: Payer: Self-pay | Admitting: Pharmacist

## 2019-11-01 NOTE — Progress Notes (Addendum)
Chronic Care Management Pharmacy Assistant   Name: Lance Powell  MRN: [id number] DOB: [birth year]  Reason for Encounter: Disease State  Patient Questions:  1.  Have you seen any other providers since your last visit? No  2.  Any changes in your medicines or health? No    PCP : [name], PA-C  No office visits, Consults, and Hospitalizations since last CCM visit on 10-11-19.  Allergies:  No Known Allergies  Medications: Outpatient Encounter Medications as of 11/01/2019  Medication Sig Note   albuterol (PROVENTIL) (2.5 MG/3ML) 0.083% nebulizer solution Take 3 mLs (2.5 mg total) by nebulization every 6 (six) hours as needed for wheezing or shortness of breath.    albuterol (VENTOLIN HFA) 108 (90 Base) MCG/ACT inhaler Inhale 2 puffs into the lungs every 6 (six) hours as needed for wheezing or shortness of breath.    aspirin 81 MG tablet Take 1 tablet (81 mg total) by mouth every morning. Stop for 1 week and then resume (Patient taking differently: Take 81 mg by mouth every morning. )    atorvastatin (LIPITOR) 40 MG tablet 1 tab po q day    celecoxib (CELEBREX) 200 MG capsule Take 1 capsule by mouth once daily    cholecalciferol (VITAMIN D) 1000 UNITS tablet Take 2,000 Units by mouth 2 (two) times daily.     diclofenac sodium (VOLTAREN) 1 % GEL Apply 2 g topically 4 (four) times daily. (Patient not taking: Reported on 09/28/2019)    enalapril (VASOTEC) 2.5 MG tablet Take 2.5 mg by mouth every morning. Reported on 07/12/2015    glucose blood (BAYER CONTOUR TEST) test strip Use as instructed to check blood sugar twice a day.  DX  E11.9    guaiFENesin (MUCINEX) 600 MG 12 hr tablet Take 1 tablet (600 mg total) by mouth 2 (two) times daily.    Indacaterol Maleate (ARCAPTA NEOHALER) 75 MCG CAPS Place into inhaler and inhale.    meclizine (ANTIVERT) 12.5 MG tablet Take 1 tablet (12.5 mg total) by mouth 3 (three) times daily as needed for dizziness.    metFORMIN (GLUCOPHAGE) 500 MG  tablet TAKE 1 TABLET BY MOUTH THREE TIMES DAILY    misoprostol (CYTOTEC) 100 MCG tablet TAKE 1 TABLET BY MOUTH TWICE DAILY (AM&PM) AND TAKE 2 TABLETS AT BEDTIME    Multiple Vitamins-Minerals (CENTRUM) tablet Take 1 tablet by mouth every morning. Reported on 01/05/2015 06/25/2019: 500 IU vitamin D   nystatin cream (MYCOSTATIN) Apply 1 application topically 2 (two) times daily.    OVER THE COUNTER MEDICATION Place 1-2 drops into both eyes daily as needed (dry red eyes.). Reported on 01/05/2015    pioglitazone (ACTOS) 15 MG tablet Take 1 tablet by mouth once daily    Tiotropium Bromide-Olodaterol (STIOLTO RESPIMAT) 2.5-2.5 MCG/ACT AERS Inhale 2 puffs into the lungs daily.    Tiotropium Bromide-Olodaterol (STIOLTO RESPIMAT) 2.5-2.5 MCG/ACT AERS Inhale 2 puffs into the lungs daily.    UNABLE TO FIND CPAP    venlafaxine XR (EFFEXOR-XR) 37.5 MG 24 hr capsule TAKE 1 CAPSULE BY MOUTH ONCE DAILY WITH BREAKFAST    vitamin C (ASCORBIC ACID) 500 MG tablet Take 500 mg by mouth every morning.     No facility-administered encounter medications on file as of 11/01/2019.    Current Diagnosis: Patient Active Problem List   Diagnosis Date Noted   Accident due to mechanical fall without injury 06/11/2019   Sprain of medial collateral ligament of right knee 04/08/2019   Prepatellar bursitis of right  knee 04/08/2019   Chronic respiratory failure ([place]) 11/06/2017   Hip pain 07/27/2014   Abrasion 07/27/2014   Insomnia 06/27/2014   Chronic cholecystitis 06/16/2014   Coccyx pain 03/30/2014   Diabetes mellitus type II, controlled ([place]) 03/08/2014   Depression 03/08/2014   GERD (gastroesophageal reflux disease) 03/08/2014   Hyperlipidemia 03/08/2014   Abdominal pain    Diverticulitis of large intestine without perforation or abscess without bleeding    Blood poisoning    Calculus of gallbladder with acute cholecystitis 02/27/2014   Acute cholecystitis    Diverticulitis 02/25/2014   Urinary retention 02/25/2014    Hypertension 02/25/2014   SBO (small bowel obstruction) ([place]) 02/25/2014   Sepsis ([place]) 02/25/2014   Cholecystitis 02/25/2014   Slow transit constipation 02/25/2014   Seborrheic dermatitis of scalp 11/24/2013   Diarrhea 07/12/2013   Contusion of multiple sites 05/24/2013   ED (erectile dysfunction) of organic origin 05/06/2013   Seborrheic keratosis 05/06/2013   Cough 02/17/2013   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   Acute maxillary sinusitis 10/30/2012   Allergic rhinitis 06/27/2012   Hypertrophic and atrophic condition of skin 06/27/2012   Increased frequency of urination 06/27/2012   Lower urinary tract infectious disease 06/27/2012   Major depressive disorder, single episode, unspecified 06/27/2012   Malaise and fatigue 06/27/2012   Psychosexual dysfunction with inhibited sexual excitement 06/27/2012   Personal history of colonic polyps 01/06/2012   Special screening for malignant neoplasms, colon 11/15/2011   Pruritus ani 11/15/2011   COPD (chronic obstructive pulmonary disease) ([place]) 01/11/2011   DOE (dyspnea on exertion) 01/01/2011   Fissure in ano 12/21/2010   HYPERLIPIDEMIA 01/11/2009   Obstructive sleep apnea 01/11/2009   ARTHRITIS, RHEUMATOID 01/11/2009   OSTEOARTHRITIS 01/11/2009    Goals Addressed   None    Called patient and discussed medication adherence with patient.  Patient states he has no complaints.  Patient denies any sides effects Patient denies ED visits since last CPP follow up Patient denies any problems with his current pharmacy  Follow-Up:  Pharmacist Review   [place] Shannon, [place] [facility] [place] Pharmacist Assistant [phone]  Reviewed by: De Blanch, PharmD Clinical Pharmacist [place] [facility] [phone]   I have collaborated with the care management provider regarding care management and care coordination activities outlined in this encounter and have reviewed this  encounter including documentation in the note and care plan. I am certifying that I agree with the content of this note and encounter as supervising provider.  [name], PA-C

## 2019-11-03 ENCOUNTER — Encounter: Payer: Self-pay | Admitting: Emergency Medicine

## 2019-11-03 ENCOUNTER — Other Ambulatory Visit: Payer: Self-pay

## 2019-11-03 ENCOUNTER — Ambulatory Visit: Payer: Medicare Other | Admitting: Emergency Medicine

## 2019-11-03 VITALS — BP 120/68 | HR 112 | Temp 97.3°F | Ht 72.0 in | Wt 249.6 lb

## 2019-11-03 DIAGNOSIS — J9611 Chronic respiratory failure with hypoxia: Secondary | ICD-10-CM | POA: Diagnosis not present

## 2019-11-03 DIAGNOSIS — Z23 Encounter for immunization: Secondary | ICD-10-CM | POA: Diagnosis not present

## 2019-11-03 DIAGNOSIS — G4733 Obstructive sleep apnea (adult) (pediatric): Secondary | ICD-10-CM

## 2019-11-03 DIAGNOSIS — J438 Other emphysema: Secondary | ICD-10-CM | POA: Diagnosis not present

## 2019-11-03 MED ORDER — FLUTICASONE PROPIONATE 50 MCG/ACT NA SUSP: 1.0000 | Freq: Every day | NASAL | 2 refills | Status: DC

## 2019-11-03 NOTE — Progress Notes (Signed)
   Subjective:    Patient ID: Lance Powell, male    DOB: 12-Feb-1929, 86 y.o.   MRN: [id number]  HPI  ROV 09/13/19 --this is a follow-up visit for 86 year old gentleman with severe COPD, OSA on CPAP and oxygen, chronic hypoxemic respiratory failure.  I last saw him 3 months ago.  We have been managing him on Stiolto, seems that he may have benefited.  He had experienced a fall and had persistent left flank pain so a performed chest x-ray that did not show any fractures. His flank pain has improved. He is using Stiolto with a spacer, but often forgets it. He has albuterol nebs, not using right now. Quite sedentary. He does wear his CPAP reliably.  COVID vaccine up to date.   R OV 11/03/2019 --follow-up visit for 86 year old man with severe COPD, OSA on CPAP and oxygen, chronic hypoxemic respiratory failure on 2 L/min.  When I saw him last visit I was concerned that he was not using his Stiolto reliably.  We added mucolytic's and a flutter valve.  He reports today that he has been having a lot of diarrhea, also notes that he has been having a lot of dental problems - has been seen by several dentists but a final plan has not been made. Still some trouble remembering the Stiolto, may be a bit more active. Some cough with difficulty clearing his mucous. Not sure that he got or has used flutter, has not gotten the guaifenesin.    No flowsheet data found.  Review of Systems As per HPI      Objective:   Physical Exam Vitals:   11/03/19 1356  BP: 120/68  Pulse: (!) 112  Temp: (!) 97.3 F (36.3 C)  TempSrc: Temporal  SpO2: 96%  Weight: 249 lb 9.6 oz (113.2 kg)  Height: 6' (1.829 m)   Gen: Pleasant, overwt man, in no distress,  normal affect  ENT: No lesions,  mouth clear,  oropharynx clear, no postnasal drip  Neck: No JVD, no stridor  Lungs: No use of accessory muscles, clear on a normal breath, loud wheeze on a forced expiration.   Cardiovascular: RRR, heart sounds normal, no murmur  or gallops, no peripheral edema  Musculoskeletal: No deformities, some tenderness left costal margin with palpation  Neuro: alert, awake, a bit tangential, poor memory. Depends on his daughter to answer questions.   Skin: Warm, no lesions or rashes      Assessment & Plan:  COPD (chronic obstructive pulmonary disease) (HCC) Please continue Stiolto 2 puffs once a day every day. Keep albuterol available use 2 puffs if needed for shortness of breath, chest times, wheezing. Start using your flutter valve for 10 breaths about 3 times a day Start guaifenesin 600 mg twice a day You benefit from walking and working on your exercise tolerance.  Try walking up your driveway 3 times a day Follow with Dr Lamonte Sakai in 4 months or sooner if you have any problems.  Chronic respiratory failure ([place]) Continue oxygen as you have been using it   Obstructive sleep apnea Continue CPAP qhs   [name], MD, PhD 10/15/2020, 11:17 AM [facility] [phone] or if no answer [phone]

## 2019-11-03 NOTE — Patient Instructions (Addendum)
Please continue Stiolto 2 puffs once a day every day. Keep albuterol available use 2 puffs if needed for shortness of breath, chest times, wheezing. Continue oxygen as you have been using it Continue your CPAP every night Start using your flutter valve for 10 breaths about 3 times a day Start guaifenesin 600 mg twice a day You benefit from walking and working on your exercise tolerance.  Try walking up your driveway 3 times a day We will try to refer you for a second opinion dental evaluation to give input regarding your tooth extractions. Follow with Dr Lamonte Sakai in 4 months or sooner if you have any problems.

## 2019-11-04 ENCOUNTER — Other Ambulatory Visit: Payer: Self-pay | Admitting: Medical

## 2019-11-23 ENCOUNTER — Telehealth: Payer: Self-pay | Admitting: Emergency Medicine

## 2019-11-23 NOTE — Telephone Encounter (Signed)
Spoke with pt, requesting that we send information to his insurance company.  I asked what was needed, he said he didn't know and to ask his insurance company.  I asked how he knew his insurance company needed something, he stated that he received a letter last week.  I asked him to read me the letter so I could send the right documentation, he declined stating that I could call his insurance company directly for this information, then hung up the phone.  Unclear what is needed.. Can perhaps try to call his DME to see if any documentation is needed as it states below this is pertaining to his oxygen, but it is after 5:00 and the office is currently closed.  Will call [employer] 11/3 to see if anything is needed.

## 2019-11-25 NOTE — Telephone Encounter (Signed)
Called [employer] and spoke to rep. She states the billing department is aware of the insurance needs and are working on it. They have been trying to communicate with the pt but he has been disconnecting the line prematurely as well.   Will follow back up with [employer] in a couple days to see if issue has been resolved with insurance.

## 2019-12-01 NOTE — Telephone Encounter (Signed)
Spoke to [place] with [employer], who  Stated taht patient has an outstanding balance that is due prior to [employer] providing patient with oxygen. [employer] has been in contact with patient regarding this matter.  Nothing further needed on our end.

## 2019-12-06 ENCOUNTER — Other Ambulatory Visit: Payer: Self-pay | Admitting: Medical

## 2019-12-09 ENCOUNTER — Ambulatory Visit: Payer: Medicare Other | Admitting: Family Medicine

## 2019-12-09 ENCOUNTER — Ambulatory Visit: Payer: Self-pay

## 2019-12-09 ENCOUNTER — Other Ambulatory Visit: Payer: Self-pay

## 2019-12-09 DIAGNOSIS — M1611 Unilateral primary osteoarthritis, right hip: Secondary | ICD-10-CM | POA: Diagnosis not present

## 2019-12-09 MED ORDER — TRIAMCINOLONE ACETONIDE 40 MG/ML IJ SUSP
40.0000 mg | Freq: Once | INTRAMUSCULAR | Status: AC
  Administered 2019-12-09: 40 mg via INTRA_ARTICULAR

## 2019-12-09 NOTE — Assessment & Plan Note (Addendum)
Acute on chronic in nature.  Degenerative changes on imaging and effusion noted on ultrasound. -Counseled on home exercise therapy and supportive care. -Injection. -Discussed replacement and he wishes to defer at the present time.

## 2019-12-09 NOTE — Patient Instructions (Signed)
Good to see you Please try ice as needed   Please send me a message in MyChart with any questions or updates.  Please see me back in 4 weeks or as needed.   --Dr. Raeford Razor

## 2019-12-09 NOTE — Progress Notes (Signed)
Lance Powell - 86 y.o. male MRN [id number]  Date of birth: 05-07-1929  SUBJECTIVE:  Including CC & ROS.  No chief complaint on file.   Lance Powell is a 86 y.o. male that is presenting with acute on chronic right hip pain.  He has received steroid injections in the hip in the past.  No recent falls or injuries.  Pain is localized to the groin..  Independent review of the right ankle x-ray from 2016 shows moderate degenerative changes of the hip joint.   Review of Systems See HPI   HISTORY: Past Medical, Surgical, Social, and Family History Reviewed & Updated per EMR.   Pertinent Historical Findings include:  Past Medical History:  Diagnosis Date  . 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 COPD (chronic obstructive pulmonary disease) ([place])   . Depression 03/08/2014  . Diabetes mellitus   . GERD (gastroesophageal reflux disease) 03/08/2014  . History of blood transfusion   . Hyperlipemia   . Hypertension   . OSA (obstructive sleep apnea)   . Osteoarthritis   . Rheumatoid arthritis(714.0)   . Sleep apnea    cpap    Past Surgical History:  Procedure Laterality Date  . APPENDECTOMY  1969  . CHOLECYSTECTOMY N/A 06/16/2014   Procedure: LAPAROSCOPIC CHOLECYSTECTOMY;  Surgeon: Greer Pickerel, MD;  Location: [facility];  Service: General;  Laterality: N/A;  . CYSTOSCOPY  01/23/2011   Procedure: CYSTOSCOPY;  Surgeon: Molli Hazard, MD;  Location: [facility];  Service: Urology;  Laterality: N/A;  . FLEXIBLE SIGMOIDOSCOPY  01/07/2012   Procedure: FLEXIBLE SIGMOIDOSCOPY;  Surgeon: Ladene Artist, MD,FACG;  Location: [facility];  Service: Endoscopy;  Laterality: N/A;  . HERNIA REPAIR  1990   right and left inguinal  . NISSEN FUNDOPLICATION    . 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 TONSILLECTOMY  1937  . TRANSURETHRAL RESECTION OF PROSTATE  01/23/2011   Procedure: TRANSURETHRAL RESECTION OF THE PROSTATE WITH GYRUS INSTRUMENTS;  Surgeon: Molli Hazard, MD;   Location: [facility]  Service: Urology;  Laterality: N/A;  . VASECTOMY  1972    Family History  Problem Relation Age of Onset  . Heart disease Mother   . Emphysema Brother   . Cancer Daughter        breast    Social History   Socioeconomic History  . Marital status: Married    Spouse name: Not on file  . Number of children: 6  . Years of education: Not on file  . Highest education level: Not on file  Occupational History  . Occupation: Retired    Comment: Print production planner  . Smoking status: Former Smoker    Packs/day: 4.00    Years: 30.00    Pack years: 120.00    Types: Cigarettes    Quit date: 01/21/1978    Years since quitting: 41.9  . Smokeless tobacco: Never Used  Vaping Use  . Vaping Use: Never used  Substance and Sexual Activity  . Alcohol use: No  . Drug use: No  . Sexual activity: Not on file  Other Topics Concern  . Not on file  Social History Narrative   Right Handed   Lives in a one story home   Drinks no caffeine    Social Determinants of Health   Financial Resource Strain: Medium Risk  . Difficulty of Paying Living Expenses: Somewhat hard  Food Insecurity:   . Worried About Charity fundraiser in  the Last Year: Not on file  . Ran Out of Food in the Last Year: Not on file  Transportation Needs:   . Lack of Transportation (Medical): Not on file  . Lack of Transportation (Non-Medical): Not on file  Physical Activity:   . Days of Exercise per Week: Not on file  . Minutes of Exercise per Session: Not on file  Stress:   . Feeling of Stress : Not on file  Social Connections:   . Frequency of Communication with Friends and Family: Not on file  . Frequency of Social Gatherings with Friends and Family: Not on file  . Attends Religious Services: Not on file  . Active Member of Clubs or Organizations: Not on file  . Attends Archivist Meetings: Not on file  . Marital Status: Not on file  Intimate Partner Violence:   .  Fear of Current or Ex-Partner: Not on file  . Emotionally Abused: Not on file  . Physically Abused: Not on file  . Sexually Abused: Not on file     PHYSICAL EXAM:  VS: BP 124/70   Ht 6' (1.829 m)   Wt 250 lb (113.4 kg)   BMI 33.91 kg/m  Physical Exam Gen: NAD, alert, cooperative with exam, well-appearing MSK:  Right hip: Limited internal rotation. Limited hip flexion. Some weakness on hip flexion and hip abduction. Neurovascular intact   Aspiration/Injection Procedure Note Lance Powell March 17, 1929  Procedure: Injection Indications: Right hip pain  Procedure Details Consent: Risks of procedure as well as the alternatives and risks of each were explained to the (patient/caregiver).  Consent for procedure obtained. Time Out: Verified patient identification, verified procedure, site/side was marked, verified correct patient position, special equipment/implants available, medications/allergies/relevent history reviewed, required imaging and test results available.  Performed.  The area was cleaned with iodine and alcohol swabs.    The right hip joint was injected using 5 cc of 1% lidocaine on a 22-gauge 3-1/2 inch needle.  The syringe was switched and a mixture containing 1 cc's of 40 mg Kenalog and 4 cc's of 0.25% bupivacaine was injected.  Ultrasound was used. Images were obtained in long views showing the injection.     A sterile dressing was applied.  Patient did tolerate procedure well.     ASSESSMENT & PLAN:   OA (osteoarthritis) of hip Acute on chronic in nature.  Degenerative changes on imaging and effusion noted on ultrasound. -Counseled on home exercise therapy and supportive care. -Injection. -Discussed replacement and he wishes to defer at the present time.

## 2020-01-05 ENCOUNTER — Telehealth: Payer: Self-pay

## 2020-01-05 MED ORDER — MISOPROSTOL 100 MCG PO TABS: ORAL_TABLET | ORAL | 3 refills | Status: DC

## 2020-01-05 NOTE — Telephone Encounter (Signed)
Rx sent 

## 2020-01-05 NOTE — Telephone Encounter (Signed)
Nurse Assessment Nurse: Park Meo, RN, Mickel Baas Date/Time Eilene Ghazi Time): 01/04/2020 6:12:54 PM Please select the assessment type ---Refill Additional Documentation ---Caller states he is completely out of Misoprostol 200 mcg. He states that his pharmacy is currently closed, so he is not able to ask for a loaner dose. Advised pt to follow up with office in am to request refill. Patient verbalized understanding. Does the patient have enough medication to last until the office opens? ---Unable to obtain loaner dose from Pharmacy Does the client directives allow for assistance with medications after hours? ---No Disp. Time Eilene Ghazi Time) Disposition Final User 01/04/2020 5:48:07 PM Attempt made - line busy Park Meo, RN, Mickel Baas 01/04/2020 6:14:57 PM Clinical Call Yes Park Meo, RN, Mickel Baas Comments User: Karin Golden, RN Date/Time Eilene Ghazi Time): 01/04/2020 6:08:54 PM PC verified phone number

## 2020-01-10 ENCOUNTER — Other Ambulatory Visit: Payer: Self-pay | Admitting: Medical

## 2020-01-11 ENCOUNTER — Other Ambulatory Visit: Payer: Self-pay

## 2020-01-11 ENCOUNTER — Ambulatory Visit (INDEPENDENT_AMBULATORY_CARE_PROVIDER_SITE_OTHER): Payer: Medicare Other | Admitting: Family Medicine

## 2020-01-11 ENCOUNTER — Ambulatory Visit (HOSPITAL_BASED_OUTPATIENT_CLINIC_OR_DEPARTMENT_OTHER)
Admission: RE | Admit: 2020-01-11 | Discharge: 2020-01-11 | Disposition: A | Discharge status: Home / Self Care | Payer: Medicare Other | Source: Ambulatory Visit | Attending: Family Medicine | Admitting: Family Medicine

## 2020-01-11 ENCOUNTER — Ambulatory Visit: Payer: Self-pay

## 2020-01-11 DIAGNOSIS — M1611 Unilateral primary osteoarthritis, right hip: Secondary | ICD-10-CM

## 2020-01-11 DIAGNOSIS — M25551 Pain in right hip: Secondary | ICD-10-CM | POA: Diagnosis not present

## 2020-01-11 MED ORDER — KETOROLAC TROMETHAMINE 30 MG/ML IJ SOLN
30.0000 mg | Freq: Once | INTRAMUSCULAR | Status: AC
  Administered 2020-01-11: 30 mg via INTRA_ARTICULAR

## 2020-01-11 NOTE — Progress Notes (Signed)
Lance Powell - 86 y.o. male MRN [id number]  Date of birth: 05/15/1929  SUBJECTIVE:  Including CC & ROS.  No chief complaint on file.   Lance Powell is a 86 y.o. male that is presenting with acute right hip pain. He did get improvement with the steroid injection last month. The pain started over this past weekend. Denies any falls in the interim. Pain localized to the joint.   Review of Systems See HPI   HISTORY: Past Medical, Surgical, Social, and Family History Reviewed & Updated per EMR.   Pertinent Historical Findings include:  Past Medical History:  Diagnosis Date  . 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 COPD (chronic obstructive pulmonary disease) ([place])   . Depression 03/08/2014  . Diabetes mellitus   . GERD (gastroesophageal reflux disease) 03/08/2014  . History of blood transfusion   . Hyperlipemia   . Hypertension   . OSA (obstructive sleep apnea)   . Osteoarthritis   . Rheumatoid arthritis(714.0)   . Sleep apnea    cpap    Past Surgical History:  Procedure Laterality Date  . APPENDECTOMY  1969  . CHOLECYSTECTOMY N/A 06/16/2014   Procedure: LAPAROSCOPIC CHOLECYSTECTOMY;  Surgeon: Greer Pickerel, MD;  Location: [facility];  Service: General;  Laterality: N/A;  . CYSTOSCOPY  01/23/2011   Procedure: CYSTOSCOPY;  Surgeon: Molli Hazard, MD;  Location: [facility];  Service: Urology;  Laterality: N/A;  . FLEXIBLE SIGMOIDOSCOPY  01/07/2012   Procedure: FLEXIBLE SIGMOIDOSCOPY;  Surgeon: Ladene Artist, MD,FACG;  [facility];  Service: Endoscopy;  Laterality: N/A;  . HERNIA REPAIR  1990   right and left inguinal  . NISSEN FUNDOPLICATION    . 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 TONSILLECTOMY  1937  . TRANSURETHRAL RESECTION OF PROSTATE  01/23/2011   Procedure: TRANSURETHRAL RESECTION OF THE PROSTATE WITH GYRUS INSTRUMENTS;  Surgeon: Molli Hazard, MD;  Location: [facility];  Service: Urology;  Laterality: N/A;  .  VASECTOMY  1972    Family History  Problem Relation Age of Onset  . Heart disease Mother   . Emphysema Brother   . Cancer Daughter        breast    Social History   Socioeconomic History  . Marital status: Married    Spouse name: Not on file  . Number of children: 6  . Years of education: Not on file  . Highest education level: Not on file  Occupational History  . Occupation: Retired    Comment: Print production planner  . Smoking status: Former Smoker    Packs/day: 4.00    Years: 30.00    Pack years: 120.00    Types: Cigarettes    Quit date: 01/21/1978    Years since quitting: 42.0  . Smokeless tobacco: Never Used  Vaping Use  . Vaping Use: Never used  Substance and Sexual Activity  . Alcohol use: No  . Drug use: No  . Sexual activity: Not on file  Other Topics Concern  . Not on file  Social History Narrative   Right Handed   Lives in a one story home   Drinks no caffeine    Social Determinants of Health   Financial Resource Strain: Medium Risk  . Difficulty of Paying Living Expenses: Somewhat hard  Food Insecurity: Not on file  Transportation Needs: Not on file  Physical Activity: Not on file  Stress: Not on file  Social Connections: Not on file  Intimate Partner Violence: Not on file     PHYSICAL EXAM:  VS: BP 130/66   Ht 6' (1.829 m)   Wt 255 lb (115.7 kg)   BMI 34.58 kg/m  Physical Exam Gen: NAD, alert, cooperative with exam, well-appearing MSK:  Right hand: No swelling or ecchymosis. Weakness with hip flexion and hip abduction. Neurovascularly intact   Aspiration/Injection Procedure Note Lance Powell November 05, 1929  Procedure: Injection Indications: Right hip pain  Procedure Details Consent: Risks of procedure as well as the alternatives and risks of each were explained to the (patient/caregiver).  Consent for procedure obtained. Time Out: Verified patient identification, verified procedure, site/side was marked, verified correct  patient position, special equipment/implants available, medications/allergies/relevent history reviewed, required imaging and test results available.  Performed.  The area was cleaned with iodine and alcohol swabs.    The right hip joint was injected using 5 cc of 1% lidocaine on a 22-gauge 3-1/2 inch needle. The syringe was switched and a mixture containing 1 cc's of 40 mg Toradol and 4 cc's of 0.25% bupivacaine was injected.  Ultrasound was used. Images were obtained in long views showing the injection.     A sterile dressing was applied.  Patient did tolerate procedure well.     ASSESSMENT & PLAN:   OA (osteoarthritis) of hip Acutely exacerbated. He did get improvement with the steroid injection. - counseled on home exercise therapy and supportive care -X-ray. -Injection. -Referral to physical therapy.

## 2020-01-11 NOTE — Patient Instructions (Addendum)
Good to see you Please try ice as needed   Physical therapy will give you a call  We did a hip joint injection with Toradol today  Please send me a message in MyChart with any questions or updates.  Please see me back in 4-6 weeks or as needed if better .   --Dr. Raeford Razor

## 2020-01-11 NOTE — Assessment & Plan Note (Addendum)
Acutely exacerbated. He did get improvement with the steroid injection. - counseled on home exercise therapy and supportive care -X-ray. -Injection. -Referral to physical therapy.

## 2020-01-12 ENCOUNTER — Telehealth: Payer: Self-pay | Admitting: Family Medicine

## 2020-01-12 NOTE — Telephone Encounter (Signed)
Informed of results.   Rosemarie Ax, MD [facility] 01/12/2020, 2:30 PM

## 2020-02-05 ENCOUNTER — Other Ambulatory Visit: Payer: Self-pay | Admitting: Medical

## 2020-02-08 ENCOUNTER — Telehealth: Payer: Self-pay | Admitting: Emergency Medicine

## 2020-02-08 NOTE — Telephone Encounter (Signed)
I called and spoke with the pt and made him aware that he needs to have ov to recertify for o2  Pt verbalized understanding  Appt scheduled for 02/21/20 with RB Nothing further needed

## 2020-02-09 ENCOUNTER — Telehealth: Payer: Self-pay | Admitting: Medical

## 2020-02-09 MED ORDER — CELECOXIB 200 MG PO CAPS: 200.0000 mg | ORAL_CAPSULE | Freq: Every day | ORAL | 1 refills | Status: DC | PRN

## 2020-02-09 NOTE — Telephone Encounter (Signed)
Med sent to correct pharmacy

## 2020-02-09 NOTE — Telephone Encounter (Signed)
Patient is requesting a 90 day supply of medication sent to pharmacy:   celecoxib (CELEBREX) 200 MG capsule [[id number]]    [id number] - [place], [place] - [address] AT [place]  [address] [place], [place] [address]  Phone:  [phone] Fax:  [phone]  DEA #:  [id number]  DAW Reason: --

## 2020-02-09 NOTE — Telephone Encounter (Signed)
Patient changing pharmacies   Patient states he would like all medication to go to [email or web address] [name], [place] [address]  ~27.8 mi [phone]

## 2020-02-09 NOTE — Telephone Encounter (Signed)
Rx sent 

## 2020-02-09 NOTE — Telephone Encounter (Signed)
Medication:  celecoxib (CELEBREX) 200 MG capsule   Has the patient contacted their pharmacy? No. (If no, request that the patient contact the pharmacy for the refill.) (If yes, when and what did the pharmacy advise?)  Preferred Pharmacy (with phone number or street name): [email or web address] [address] [place], [place] [address]  ~27.8 mi [phone]  Agent: Please be advised that RX refills may take up to 3 business days. We ask that you follow-up with your pharmacy.

## 2020-02-09 NOTE — Telephone Encounter (Signed)
Error

## 2020-02-15 ENCOUNTER — Ambulatory Visit: Payer: Medicare Other | Admitting: Medical

## 2020-02-15 ENCOUNTER — Ambulatory Visit: Payer: Medicare Other | Admitting: Physical Therapy

## 2020-02-21 ENCOUNTER — Ambulatory Visit: Payer: Medicare Other | Admitting: Emergency Medicine

## 2020-02-22 ENCOUNTER — Ambulatory Visit: Payer: Medicare Other | Admitting: Family Medicine

## 2020-02-24 ENCOUNTER — Telehealth: Payer: Self-pay | Admitting: Medical

## 2020-02-24 MED ORDER — CELECOXIB 200 MG PO CAPS: 200.0000 mg | ORAL_CAPSULE | Freq: Every day | ORAL | 1 refills | Status: DC | PRN

## 2020-02-24 NOTE — Telephone Encounter (Signed)
Patient would like refill for 90 days   Medication: celecoxib (CELEBREX) 200 MG capsule [[id number]]    Has the patient contacted their pharmacy? no (If no, request that the patient contact the pharmacy for the refill.) (If yes, when and what did the pharmacy advise?)    Preferred Pharmacy (with phone number or street name):    [id number] - [place], [place] - [address] AT [place] Phone:  [phone]  Fax:  [phone]         Agent: Please be advised that RX refills may take up to 3 business days. We ask that you follow-up with your pharmacy.

## 2020-02-24 NOTE — Telephone Encounter (Signed)
Rx sent 

## 2020-03-01 ENCOUNTER — Other Ambulatory Visit: Payer: Self-pay | Admitting: Medical

## 2020-03-01 ENCOUNTER — Telehealth: Payer: Self-pay | Admitting: Medical

## 2020-03-01 NOTE — Telephone Encounter (Signed)
Per patient pharmacy does not have rx 30 nor 90  Patient prefer a 90 day supply  celecoxib (CELEBREX) 200 MG capsule [[id number]]    [facility] #[id number] - [place], [place] - [address] AT [facility] [address]  [address], [place] [address]  Phone:  [phone] Fax:  [phone]

## 2020-03-01 NOTE — Telephone Encounter (Signed)
Per chart medication was sent in on 2/3 , called pharmacy they were on a meal break , will call back

## 2020-03-01 NOTE — Telephone Encounter (Signed)
Spoke with pharmacy stated script was ready , called pt and notified.

## 2020-03-14 ENCOUNTER — Ambulatory Visit: Payer: Medicare Other | Admitting: Medical

## 2020-03-23 ENCOUNTER — Telehealth: Payer: Self-pay | Admitting: Pharmacist

## 2020-03-23 NOTE — Progress Notes (Addendum)
Chronic Care Management Pharmacy Assistant   Name: Lance Powell  MRN: [id number] DOB: 03-20-1929  Reason for Encounter: Disease State For COPD.  Patient Questions:  1.  Have you seen any other providers since your last visit? Yes.   2.  Any changes in your medicines or health? Yes.  PCP : [name], PA-C   Their chronic conditions include: COPD, DM, HTN, HLD, Depression, Osteoarthritis  Office Visits: None since 11/01/19  Consults: 01/11/20 Sports Medic Rosemarie Ax, MD. Per note:  X-rays taken. Referral to physical therapy, injection in the right hip, and encouraged home exercise. No medication changes.  12/09/19 Sports Medic Rosemarie Ax, MD. For Osteoarthritis in his right hip. Per note: Injection in the right hip, the doctor advised for home exercise therapy and supportive care. No medication changes.   11/03/19 Pulmonology Byrum, Rose Fillers, MD. Per note: Continue medications, start OTC Mucinex, and exercise as much as he is able. Referral for a dental evaluation.   Allergies:  No Known Allergies  Medications: Outpatient Encounter Medications as of 03/23/2020  Medication Sig Note   albuterol (PROVENTIL) (2.5 MG/3ML) 0.083% nebulizer solution Take 3 mLs (2.5 mg total) by nebulization every 6 (six) hours as needed for wheezing or shortness of breath.    albuterol (VENTOLIN HFA) 108 (90 Base) MCG/ACT inhaler Inhale 2 puffs into the lungs every 6 (six) hours as needed for wheezing or shortness of breath.    aspirin 81 MG tablet Take 1 tablet (81 mg total) by mouth every morning. Stop for 1 week and then resume (Patient taking differently: Take 81 mg by mouth every morning. )    atorvastatin (LIPITOR) 40 MG tablet 1 tab po q day    celecoxib (CELEBREX) 200 MG capsule Take 1 capsule (200 mg total) by mouth daily as needed for moderate pain.    cholecalciferol (VITAMIN D) 1000 UNITS tablet Take 2,000 Units by mouth 2 (two) times daily.     diclofenac sodium  (VOLTAREN) 1 % GEL Apply 2 g topically 4 (four) times daily.    enalapril (VASOTEC) 2.5 MG tablet Take 2.5 mg by mouth every morning. Reported on 07/12/2015    fluticasone (FLONASE) 50 MCG/ACT nasal spray Place 1 spray into both nostrils daily.    glucose blood (BAYER CONTOUR TEST) test strip Use as instructed to check blood sugar twice a day.  DX  E11.9    guaiFENesin (MUCINEX) 600 MG 12 hr tablet Take 1 tablet (600 mg total) by mouth 2 (two) times daily.    Indacaterol Maleate (ARCAPTA NEOHALER) 75 MCG CAPS Place into inhaler and inhale.    meclizine (ANTIVERT) 12.5 MG tablet Take 1 tablet (12.5 mg total) by mouth 3 (three) times daily as needed for dizziness.    metFORMIN (GLUCOPHAGE) 500 MG tablet TAKE 1 TABLET BY MOUTH THREE TIMES DAILY    misoprostol (CYTOTEC) 100 MCG tablet TAKE 1 TABLET BY MOUTH TWICE DAILY (AM&PM) AND TAKE 2 TABLETS AT BEDTIME    Multiple Vitamins-Minerals (CENTRUM) tablet Take 1 tablet by mouth every morning. Reported on 01/05/2015 06/25/2019: 500 IU vitamin D   nystatin cream (MYCOSTATIN) Apply 1 application topically 2 (two) times daily.    OVER THE COUNTER MEDICATION Place 1-2 drops into both eyes daily as needed (dry red eyes.). Reported on 01/05/2015    pioglitazone (ACTOS) 15 MG tablet Take 1 tablet by mouth once daily    Tiotropium Bromide-Olodaterol (STIOLTO RESPIMAT) 2.5-2.5 MCG/ACT AERS Inhale 2 puffs into the lungs  daily.    Tiotropium Bromide-Olodaterol (STIOLTO RESPIMAT) 2.5-2.5 MCG/ACT AERS Inhale 2 puffs into the lungs daily.    UNABLE TO FIND CPAP    venlafaxine XR (EFFEXOR-XR) 37.5 MG 24 hr capsule TAKE 1 CAPSULE BY MOUTH ONCE DAILY WITH BREAKFAST    vitamin C (ASCORBIC ACID) 500 MG tablet Take 500 mg by mouth every morning.     No facility-administered encounter medications on file as of 03/23/2020.    Current Diagnosis: Patient Active Problem List   Diagnosis Date Noted   Accident due to mechanical fall without injury 06/11/2019   Sprain of medial  collateral ligament of right knee 04/08/2019   Prepatellar bursitis of right knee 04/08/2019   Chronic respiratory failure ([place]) 11/06/2017   OA (osteoarthritis) of hip 07/27/2014   Abrasion 07/27/2014   Insomnia 06/27/2014   Chronic cholecystitis 06/16/2014   Coccyx pain 03/30/2014   Diabetes mellitus type II, controlled ([place]) 03/08/2014   Depression 03/08/2014   GERD (gastroesophageal reflux disease) 03/08/2014   Hyperlipidemia 03/08/2014   Abdominal pain    Diverticulitis of large intestine without perforation or abscess without bleeding    Blood poisoning    Calculus of gallbladder with acute cholecystitis 02/27/2014   Acute cholecystitis    Diverticulitis 02/25/2014   Urinary retention 02/25/2014   Hypertension 02/25/2014   SBO (small bowel obstruction) ([place]) 02/25/2014   Sepsis ([place]) 02/25/2014   Cholecystitis 02/25/2014   Slow transit constipation 02/25/2014   Seborrheic dermatitis of scalp 11/24/2013   Diarrhea 07/12/2013   Contusion of multiple sites 05/24/2013   ED (erectile dysfunction) of organic origin 05/06/2013   Seborrheic keratosis 05/06/2013   Cough 02/17/2013   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   Acute maxillary sinusitis 10/30/2012   Allergic rhinitis 06/27/2012   Hypertrophic and atrophic condition of skin 06/27/2012   Increased frequency of urination 06/27/2012   Lower urinary tract infectious disease 06/27/2012   Major depressive disorder, single episode, unspecified 06/27/2012   Malaise and fatigue 06/27/2012   Psychosexual dysfunction with inhibited sexual excitement 06/27/2012   Personal history of colonic polyps 01/06/2012   Special screening for malignant neoplasms, colon 11/15/2011   Pruritus ani 11/15/2011   COPD (chronic obstructive pulmonary disease) ([place]) 01/11/2011   DOE (dyspnea on exertion) 01/01/2011   Fissure in ano 12/21/2010   HYPERLIPIDEMIA 01/11/2009   Obstructive sleep apnea 01/11/2009   ARTHRITIS, RHEUMATOID 01/11/2009    OSTEOARTHRITIS 01/11/2009    Goals Addressed   None    GEN Call:  Patient stated his runny nose is completely gone. He stated he usually eats three meals daily. He stated for breakfast its usually cereal, lunch is usually sandwich's or soup and for dinner him and his daughter usually get take out and he stated its usually healthy take out. He stated he drinks about 24 oz of water daily. He stated he has not started any exercising at this time.The patient stated he does not have any questions or concerns about his medication at this.   Follow-Up:  Pharmacist Review   Charlann Lange, RMA Clinical Pharmacist Assistant [phone]  10 minutes spent in review, coordination, and documentation.  Reviewed by: Beverly Milch, PharmD Clinical Pharmacist [facility] [phone]

## 2020-03-29 ENCOUNTER — Ambulatory Visit: Payer: Medicare Other | Admitting: Neurology

## 2020-05-11 ENCOUNTER — Telehealth: Payer: Self-pay | Admitting: Medical

## 2020-05-11 MED ORDER — PIOGLITAZONE HCL 15 MG PO TABS: 15.0000 mg | ORAL_TABLET | Freq: Every day | ORAL | 0 refills | Status: DC

## 2020-05-11 NOTE — Telephone Encounter (Signed)
Go ahead and give him 14 tab refill. He is coming in next week. Will repeat labs. He might need dose increase depending on lab results.

## 2020-05-11 NOTE — Telephone Encounter (Signed)
14 day supply sent

## 2020-05-11 NOTE — Telephone Encounter (Signed)
Patient wants 90day supply   Medication: pioglitazone (ACTOS) 15 MG tablet    Has the patient contacted their pharmacy? No. (If no, request that the patient contact the pharmacy for the refill.) (If yes, when and what did the pharmacy advise?)  Preferred Pharmacy (with phone number or street name):  [id number] - [place], [place] - [address] AT [place] Phone:  [phone]  Fax:  [phone]       Agent: Please be advised that RX refills may take up to 3 business days. We ask that you follow-up with your pharmacy.

## 2020-05-11 NOTE — Telephone Encounter (Signed)
Ok to refill ? Last filled in 12/2019 with no refills

## 2020-05-15 ENCOUNTER — Other Ambulatory Visit: Payer: Self-pay

## 2020-05-15 ENCOUNTER — Ambulatory Visit: Payer: Medicare Other | Admitting: Medical

## 2020-05-15 VITALS — BP 110/60 | HR 100 | Resp 20 | Ht 72.0 in | Wt 235.8 lb

## 2020-05-15 DIAGNOSIS — I10 Essential (primary) hypertension: Secondary | ICD-10-CM | POA: Diagnosis not present

## 2020-05-15 DIAGNOSIS — Z794 Long term (current) use of insulin: Secondary | ICD-10-CM

## 2020-05-15 DIAGNOSIS — E118 Type 2 diabetes mellitus with unspecified complications: Secondary | ICD-10-CM | POA: Diagnosis not present

## 2020-05-15 DIAGNOSIS — E785 Hyperlipidemia, unspecified: Secondary | ICD-10-CM | POA: Diagnosis not present

## 2020-05-15 DIAGNOSIS — J438 Other emphysema: Secondary | ICD-10-CM

## 2020-05-15 DIAGNOSIS — R252 Cramp and spasm: Secondary | ICD-10-CM

## 2020-05-15 DIAGNOSIS — M2550 Pain in unspecified joint: Secondary | ICD-10-CM

## 2020-05-15 MED ORDER — CELECOXIB 200 MG PO CAPS: 200.0000 mg | ORAL_CAPSULE | Freq: Every day | ORAL | 1 refills | Status: DC | PRN

## 2020-05-15 MED ORDER — GLUCOSE BLOOD VI STRP: ORAL_STRIP | 5 refills | Status: DC

## 2020-05-15 MED ORDER — METFORMIN HCL 500 MG PO TABS: 1.0000 | ORAL_TABLET | Freq: Three times a day (TID) | ORAL | 3 refills | Status: DC

## 2020-05-15 NOTE — Progress Notes (Signed)
Subjective:    Patient ID: Lance Powell, male    DOB: February 10, 1929, 87 y.o.   MRN: [id number]  HPI  Pt in for follow up.  Vaccine counseling on covid about 4th booster. No upcoming travel. Had 2 covid vaccine with booster.  Pt has diabetes. He is on metformin. Recent piaglitazone cost too much. States 12 tab cost $70 tablets. No hyperglycemic signs or symptoms.   Pt has mild elevated triglycerides. Will repeat lipid panel today. Pt on atorvastatin 40 mg daily.  Bp well controlled today.  Copd hx. Pt on 02. Sees pulmonologist. On stioloto inhaler. Pt daughter states will get him scheduled.  Rare calf muscle cramp left side.   Review of Systems  Constitutional: Negative for chills, fatigue and fever.  HENT: Negative for congestion, dental problem and ear discharge.   Respiratory: Negative for chest tightness, shortness of breath and wheezing.   Cardiovascular: Negative for chest pain and palpitations.  Gastrointestinal: Negative for abdominal pain.  Genitourinary: Negative for dysuria and frequency.  Musculoskeletal: Negative for back pain, joint swelling and neck pain.  Skin: Negative for rash.  Neurological: Negative for dizziness, speech difficulty, weakness and headaches.  Hematological: Negative for adenopathy. Does not bruise/bleed easily.  Psychiatric/Behavioral: Negative for behavioral problems and decreased concentration.    Past Medical History:  Diagnosis Date  . 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 COPD (chronic obstructive pulmonary disease) ([place])   . Depression 03/08/2014  . Diabetes mellitus   . GERD (gastroesophageal reflux disease) 03/08/2014  . History of blood transfusion   . Hyperlipemia   . Hypertension   . OSA (obstructive sleep apnea)   . Osteoarthritis   . Rheumatoid arthritis(714.0)   . Sleep apnea    cpap     Social History   Socioeconomic History  . Marital status: Married    Spouse name: Not on file  . Number of children: 6  . Years of  education: Not on file  . Highest education level: Not on file  Occupational History  . Occupation: Retired    Comment: Print production planner  . Smoking status: Former Smoker    Packs/day: 4.00    Years: 30.00    Pack years: 120.00    Types: Cigarettes    Quit date: 01/21/1978    Years since quitting: 42.3  . Smokeless tobacco: Never Used  Vaping Use  . Vaping Use: Never used  Substance and Sexual Activity  . Alcohol use: No  . Drug use: No  . Sexual activity: Not on file  Other Topics Concern  . Not on file  Social History Narrative   Right Handed   Lives in a one story home   Drinks no caffeine    Social Determinants of Health   Financial Resource Strain: Medium Risk  . Difficulty of Paying Living Expenses: Somewhat hard  Food Insecurity: Not on file  Transportation Needs: Not on file  Physical Activity: Not on file  Stress: Not on file  Social Connections: Not on file  Intimate Partner Violence: Not on file    Past Surgical History:  Procedure Laterality Date  . APPENDECTOMY  1969  . CHOLECYSTECTOMY N/A 06/16/2014   Procedure: LAPAROSCOPIC CHOLECYSTECTOMY;  Surgeon: Greer Pickerel, MD;  Location: [facility];  Service: General;  Laterality: N/A;  . CYSTOSCOPY  01/23/2011   Procedure: CYSTOSCOPY;  Surgeon: Molli Hazard, MD;  Location: [facility];  Service: Urology;  Laterality: N/A;  . FLEXIBLE SIGMOIDOSCOPY  01/07/2012  Procedure: FLEXIBLE SIGMOIDOSCOPY;  Surgeon: Ladene Artist, MD,FACG;  Location: [facility];  Service: Endoscopy;  Laterality: N/A;  . HERNIA REPAIR  1990   right and left inguinal  . NISSEN FUNDOPLICATION    . 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 TONSILLECTOMY  1937  . TRANSURETHRAL RESECTION OF PROSTATE  01/23/2011   Procedure: TRANSURETHRAL RESECTION OF THE PROSTATE WITH GYRUS INSTRUMENTS;  Surgeon: Molli Hazard, MD;  Location: [facility];  Service: Urology;  Laterality: N/A;  . VASECTOMY   1972    Family History  Problem Relation Age of Onset  . Heart disease Mother   . Emphysema Brother   . Cancer Daughter        breast    No Known Allergies  Current Outpatient Medications on File Prior to Visit  Medication Sig Dispense Refill  . albuterol (PROVENTIL) (2.5 MG/3ML) 0.083% nebulizer solution Take 3 mLs (2.5 mg total) by nebulization every 6 (six) hours as needed for wheezing or shortness of breath. 150 mL 1  . albuterol (VENTOLIN HFA) 108 (90 Base) MCG/ACT inhaler Inhale 2 puffs into the lungs every 6 (six) hours as needed for wheezing or shortness of breath. 8 g 2  . aspirin 81 MG tablet Take 1 tablet (81 mg total) by mouth every morning. Stop for 1 week and then resume (Patient taking differently: Take 81 mg by mouth every morning. )    . atorvastatin (LIPITOR) 40 MG tablet 1 tab po q day 90 tablet 3  . celecoxib (CELEBREX) 200 MG capsule Take 1 capsule (200 mg total) by mouth daily as needed for moderate pain. 30 capsule 1  . cholecalciferol (VITAMIN D) 1000 UNITS tablet Take 2,000 Units by mouth 2 (two) times daily.     . diclofenac sodium (VOLTAREN) 1 % GEL Apply 2 g topically 4 (four) times daily. 50 g 0  . enalapril (VASOTEC) 2.5 MG tablet Take 2.5 mg by mouth every morning. Reported on 07/12/2015    . fluticasone (FLONASE) 50 MCG/ACT nasal spray Place 1 spray into both nostrils daily. 16 g 2  . glucose blood (BAYER CONTOUR TEST) test strip Use as instructed to check blood sugar twice a day.  DX  E11.9 100 each 5  . guaiFENesin (MUCINEX) 600 MG 12 hr tablet Take 1 tablet (600 mg total) by mouth 2 (two) times daily. 60 tablet 2  . Indacaterol Maleate (ARCAPTA NEOHALER) 75 MCG CAPS Place into inhaler and inhale.    . meclizine (ANTIVERT) 12.5 MG tablet Take 1 tablet (12.5 mg total) by mouth 3 (three) times daily as needed for dizziness. 30 tablet 0  . metFORMIN (GLUCOPHAGE) 500 MG tablet TAKE 1 TABLET BY MOUTH THREE TIMES DAILY 90 tablet 0  . misoprostol (CYTOTEC) 100  MCG tablet TAKE 1 TABLET BY MOUTH TWICE DAILY (AM&PM) AND TAKE 2 TABLETS AT BEDTIME 360 tablet 3  . Multiple Vitamins-Minerals (CENTRUM) tablet Take 1 tablet by mouth every morning. Reported on 01/05/2015    . nystatin cream (MYCOSTATIN) Apply 1 application topically 2 (two) times daily. 30 g 0  . OVER THE COUNTER MEDICATION Place 1-2 drops into both eyes daily as needed (dry red eyes.). Reported on 01/05/2015    . pioglitazone (ACTOS) 15 MG tablet Take 1 tablet (15 mg total) by mouth daily. 14 tablet 0  . Tiotropium Bromide-Olodaterol (STIOLTO RESPIMAT) 2.5-2.5 MCG/ACT AERS Inhale 2 puffs into the lungs daily. 1 g 0  . Tiotropium Bromide-Olodaterol (STIOLTO RESPIMAT) 2.5-2.5 MCG/ACT  AERS Inhale 2 puffs into the lungs daily. 4 g 6  . UNABLE TO FIND CPAP    . venlafaxine XR (EFFEXOR-XR) 37.5 MG 24 hr capsule TAKE 1 CAPSULE BY MOUTH ONCE DAILY WITH BREAKFAST 90 capsule 3  . vitamin C (ASCORBIC ACID) 500 MG tablet Take 500 mg by mouth every morning.      No current facility-administered medications on file prior to visit.    BP 121/73   Pulse 100   Resp 20   Ht 6' (1.829 m)   Wt 235 lb 12.8 oz (107 kg)   SpO2 92%   BMI 31.98 kg/m       Objective:   Physical Exam  General Mental Status- Alert. General Appearance- Not in acute distress.   Skin General: Color- Normal Color. Moisture- Normal Moisture.  Neck Carotid Arteries- Normal color. Moisture- Normal Moisture. No carotid bruits. No JVD.  Chest and Lung Exam Auscultation: Breath Sounds:-Normal.  Cardiovascular Auscultation:Rythm- Regular. Murmurs & Other Heart Sounds:Auscultation of the heart reveals- No Murmurs.  Abdomen Inspection:-Inspeection Normal. Palpation/Percussion:Note:No mass. Palpation and Percussion of the abdomen reveal- Non Tender, Non Distended + BS, no rebound or guarding.    Neurologic Cranial Nerve exam:- CN III-XII intact(No nystagmus), symmetric smile. Strength:- 5/5 equal and symmetric strength  both upper and lower extremities.  Lower ext- no pedal edema. Symmetric calves. Negative homans signs.      Assessment & Plan:  History of hypertension.  Blood pressure is very well controlled today and you have not been on the enalapril.  So decided not to refill as want to avoid hypotension since so tightly controlled.  History of hyperlipidemia.  We will get metabolic panel and lipid panel today.  Continue atorvastatin.  History of diabetes with A1c 6.8 about 1 year ago.  Refilled her metformin today.  We will follow A1c result and decide on dosing of Actos/pioglitazone.  Recent rare occasional left calf cramp.  Will get magnesium level and follow electrolytes on metabolic panel.  History of COPD.  Continue oxygen and Stiolto inhaler.  Please go ahead and call pulmonologist office and schedule follow-up with them.  History of intermittent arthralgias.  Refilled your Celebrex but recommend only using on occasion/not daily as can decrease kidney function.  Also make sure that you stay well-hydrated.  Vaccine counseling regarding timing of next COVID booster.  Follow-up in 3 months or as needed.  [name], PA-C   Time spent with patient today was 41  minutes which consisted of chart review, discussing diagnoses, work up ,treatment and documentation.

## 2020-05-15 NOTE — Patient Instructions (Addendum)
History of hypertension.  Blood pressure is very well controlled today and you have not been on the enalapril.  So decided not to refill as want to avoid hypotension since so tightly controlled.  History of hyperlipidemia.  We will get metabolic panel and lipid panel today.  Continue atorvastatin.  History of diabetes with A1c 6.8 about 1 year ago.  Refilled her metformin today.  We will follow A1c result and decide on dosing of Actos/pioglitazone.  Recent rare occasional left calf cramp.  Will get magnesium level and follow electrolytes on metabolic panel.  History of COPD.  Continue oxygen and Stiolto inhaler.  Please go ahead and call pulmonologist office and schedule follow-up with them.  History of intermittent arthralgias.  Refilled your Celebrex but recommend only using on occasion/not daily as can decrease kidney function.  Also make sure that you stay well-hydrated.  Vaccine counseling regarding timing of next COVID booster.  Follow-up in 3 months or as needed.

## 2020-05-16 ENCOUNTER — Telehealth: Payer: Self-pay | Admitting: Medical

## 2020-05-16 LAB — COMPREHENSIVE METABOLIC PANEL
ALT: 19 U/L (ref 0–53)
AST: 18 U/L (ref 0–37)
Albumin: 4.1 g/dL (ref 3.5–5.2)
Alkaline Phosphatase: 73 U/L (ref 39–117)
BUN: 21 mg/dL (ref 6–23)
CO2: 33 mEq/L — ABNORMAL HIGH (ref 19–32)
Calcium: 10.6 mg/dL — ABNORMAL HIGH (ref 8.4–10.5)
Chloride: 103 mEq/L (ref 96–112)
Creatinine, Ser: 1.21 mg/dL (ref 0.40–1.50)
GFR: 53.42 mL/min — ABNORMAL LOW (ref >60.00)
Glucose, Bld: 126 mg/dL — ABNORMAL HIGH (ref 70–99)
Potassium: 4.8 mEq/L (ref 3.5–5.1)
Sodium: 141 mEq/L (ref 135–145)
Total Bilirubin: 0.5 mg/dL (ref 0.2–1.2)
Total Protein: 7.3 g/dL (ref 6.0–8.3)

## 2020-05-16 LAB — LIPID PANEL
Cholesterol: 121 mg/dL (ref 0–200)
HDL: 36.5 mg/dL — ABNORMAL LOW (ref >39.00)
LDL Cholesterol: 68 mg/dL (ref 0–99)
NonHDL: 84.08
Total CHOL/HDL Ratio: 3
Triglycerides: 80 mg/dL (ref 0.0–149.0)
VLDL: 16 mg/dL (ref 0.0–40.0)

## 2020-05-16 LAB — HEMOGLOBIN A1C: Hgb A1c MFr Bld: 6.5 % (ref 4.6–6.5)

## 2020-05-16 LAB — MAGNESIUM: Magnesium: 1.6 mg/dL (ref 1.5–2.5)

## 2020-05-16 MED ORDER — PIOGLITAZONE HCL 15 MG PO TABS: 15.0000 mg | ORAL_TABLET | Freq: Every day | ORAL | 3 refills | Status: DC

## 2020-05-16 NOTE — Telephone Encounter (Signed)
Rx actos sent to pt pharmacy.

## 2020-05-23 DIAGNOSIS — J449 Chronic obstructive pulmonary disease, unspecified: Secondary | ICD-10-CM | POA: Diagnosis not present

## 2020-05-23 DIAGNOSIS — G4733 Obstructive sleep apnea (adult) (pediatric): Secondary | ICD-10-CM | POA: Diagnosis not present

## 2020-05-23 DIAGNOSIS — J9610 Chronic respiratory failure, unspecified whether with hypoxia or hypercapnia: Secondary | ICD-10-CM | POA: Diagnosis not present

## 2020-06-16 ENCOUNTER — Telehealth: Payer: Self-pay | Admitting: Pharmacist

## 2020-06-16 NOTE — Chronic Care Management (AMB) (Signed)
Chronic Care Management Pharmacy Assistant   Name: Lance Powell  MRN: [id number] DOB: 08/18/1929   Reason for Encounter: Disease State-COPD    Recent office visits:  05/15/20 Lance Pai PA-C(PCP)  General follow up. Follow up in 3 months.   Recent consult visits:  01/11/20 Lance Powell(Sport medicine) Seen for osteoarthritis of right hip. Right hip injection given. Referral to physical therapy placed.  Hospital visits:  None in previous 6 months  Medications: Outpatient Encounter Medications as of 06/16/2020  Medication Sig Note  . albuterol (PROVENTIL) (2.5 MG/3ML) 0.083% nebulizer solution Take 3 mLs (2.5 mg total) by nebulization every 6 (six) hours as needed for wheezing or shortness of breath.   Marland Kitchen albuterol (VENTOLIN HFA) 108 (90 Base) MCG/ACT inhaler Inhale 2 puffs into the lungs every 6 (six) hours as needed for wheezing or shortness of breath.   Marland Kitchen aspirin 81 MG tablet Take 1 tablet (81 mg total) by mouth every morning. Stop for 1 week and then resume (Patient taking differently: Take 81 mg by mouth every morning. )   . atorvastatin (LIPITOR) 40 MG tablet 1 tab po q day   . celecoxib (CELEBREX) 200 MG capsule Take 1 capsule (200 mg total) by mouth daily as needed for moderate pain.   . cholecalciferol (VITAMIN D) 1000 UNITS tablet Take 2,000 Units by mouth 2 (two) times daily.    . diclofenac sodium (VOLTAREN) 1 % GEL Apply 2 g topically 4 (four) times daily.   . enalapril (VASOTEC) 2.5 MG tablet Take 2.5 mg by mouth every morning. Reported on 07/12/2015   . fluticasone (FLONASE) 50 MCG/ACT nasal spray Place 1 spray into both nostrils daily.   Marland Kitchen glucose blood (BAYER CONTOUR TEST) test strip Use as instructed to check blood sugar twice a day.  DX  E11.9   . guaiFENesin (MUCINEX) 600 MG 12 hr tablet Take 1 tablet (600 mg total) by mouth 2 (two) times daily.   . Indacaterol Maleate (ARCAPTA NEOHALER) 75 MCG CAPS Place into inhaler and inhale.   . meclizine  (ANTIVERT) 12.5 MG tablet Take 1 tablet (12.5 mg total) by mouth 3 (three) times daily as needed for dizziness.   . metFORMIN (GLUCOPHAGE) 500 MG tablet Take 1 tablet (500 mg total) by mouth 3 (three) times daily.   . misoprostol (CYTOTEC) 100 MCG tablet TAKE 1 TABLET BY MOUTH TWICE DAILY (AM&PM) AND TAKE 2 TABLETS AT BEDTIME   . Multiple Vitamins-Minerals (CENTRUM) tablet Take 1 tablet by mouth every morning. Reported on 01/05/2015 06/25/2019: 500 IU vitamin D  . nystatin cream (MYCOSTATIN) Apply 1 application topically 2 (two) times daily.   Marland Kitchen OVER THE COUNTER MEDICATION Place 1-2 drops into both eyes daily as needed (dry red eyes.). Reported on 01/05/2015   . pioglitazone (ACTOS) 15 MG tablet Take 1 tablet (15 mg total) by mouth daily.   . Tiotropium Bromide-Olodaterol (STIOLTO RESPIMAT) 2.5-2.5 MCG/ACT AERS Inhale 2 puffs into the lungs daily.   . Tiotropium Bromide-Olodaterol (STIOLTO RESPIMAT) 2.5-2.5 MCG/ACT AERS Inhale 2 puffs into the lungs daily.   Marland Kitchen UNABLE TO FIND CPAP   . venlafaxine XR (EFFEXOR-XR) 37.5 MG 24 hr capsule TAKE 1 CAPSULE BY MOUTH ONCE DAILY WITH BREAKFAST   . vitamin C (ASCORBIC ACID) 500 MG tablet Take 500 mg by mouth every morning.     No facility-administered encounter medications on file as of 06/16/2020.   . Current COPD regimen:  o Albuterol 2.5mg /106ml take 3 mls by nebulization every 6 hours  as needed o Albuterol 108 mcg inhale 2 puffs every 6 hours as needed o Stiolto inhale 2 puffs daily  . No flowsheet data found.   . Any recent hospitalizations or ED visits since last visit with CPP? No   . Reportsdenies COPD symptoms, including   . What recent interventions/DTPs have been made by any provider to improve breathing since last visit:None noted  . Have you had exacerbation/flare-up since last visit?   Marland Kitchen What do you do when you are short of breath?    Respiratory Devices/Equipment . Do you have a nebulizer? Yes . Do you use a Peak Flow Meter?  Marland Kitchen Do you  use a maintenance inhaler? Yes . How often do you forget to use your daily inhaler?  Marland Kitchen Do you use a rescue inhaler? Yes . How often do you use your rescue inhaler?  1-2x per week . Do you use a spacer with your inhaler?   Adherence Review: . Does the patient have >5 day gap between last estimated fill date for maintenance inhaler medications? Yes     Star Rating Drugs: Atorvastatin 40 mg last filled enalapril 2.5 mg last filled Metformin 500 mg last filled  Pioglitazone 15 mg last filled  Left voicemail x 2, patient stated he will call back. Unable to complete questions.  Lizbeth Bark Clinical Pharmacist Assistant [phone]

## 2020-06-26 ENCOUNTER — Ambulatory Visit (INDEPENDENT_AMBULATORY_CARE_PROVIDER_SITE_OTHER): Payer: Medicare Other | Admitting: Pharmacist

## 2020-06-26 DIAGNOSIS — Z794 Long term (current) use of insulin: Secondary | ICD-10-CM

## 2020-06-26 DIAGNOSIS — I10 Essential (primary) hypertension: Secondary | ICD-10-CM

## 2020-06-26 DIAGNOSIS — E118 Type 2 diabetes mellitus with unspecified complications: Secondary | ICD-10-CM

## 2020-06-26 DIAGNOSIS — J438 Other emphysema: Secondary | ICD-10-CM

## 2020-06-26 DIAGNOSIS — E785 Hyperlipidemia, unspecified: Secondary | ICD-10-CM | POA: Diagnosis not present

## 2020-06-27 NOTE — Chronic Care Management (AMB) (Addendum)
Chronic Care Management Pharmacy Note  06/30/2020 Name:  Lance Powell MRN:  [id number] DOB:  29-Dec-1929  Summary: Patient has not been using inhalers for COPD or albuterol for nebulizer. Only using supplemental O2 daily.  Recommendations/Changes made from today's visit: 1) Enalapril stopped at appt 05/15/2020 to prevent hypotension, since patient was not taking. Removed from med list 2) Discussed adherence with Stiolto. Patient reports partly due to medication cost and partly just that he doesn't remember to use inhalers. He does not qualify for Stiolto patient assistance but might for similar inhaler like Bevespi (also does not qualify for Anoro / [place] patient assistance). Message sent to pulmonologist to see if switch to [facility] would be OK.  Subjective: HARBERT Lance Powell is an 87 y.o. year old male who is a primary patient of Lance Powell, [place].  The CCM team was consulted for assistance with disease management and care coordination needs.    Engaged with patient by telephone for follow up visit in response to provider referral for pharmacy case management and/or care coordination services.   Consent to Services:  The patient was given information about Chronic Care Management services, agreed to services, and gave verbal consent prior to initiation of services.  Please see initial visit note for detailed documentation.   Patient Care Team: [name], Iris Pert as PCP - General (Physician Assistant) Collene Gobble, MD as Consulting Physician (Pulmonary Disease) [name], MD as Consulting Physician (Ophthalmology) Cameron Sprang, MD as Consulting Physician (Neurology) Cherre Robins, PharmD (Pharmacist)  Recent office visits: 06/25/2020 - PCP ([facility]) - patient reported he had not been taking enalapril. Recommended to not restart to prevent hypotension  Recent consult visits: None in last 6 months  Hospital visits: None in previous 6  months  Objective:  Lab Results  Component Value Date   CREATININE 1.21 05/15/2020   CREATININE 1.20 (H) 06/01/2019   CREATININE 1.16 05/12/2019    Lab Results  Component Value Date   HGBA1C 6.5 05/15/2020   Last diabetic Eye exam:  Lab Results  Component Value Date/Time   HMDIABEYEEXA No Retinopathy 12/21/2015 12:00 AM    Last diabetic Foot exam: No results found for: HMDIABFOOTEX      Component Value Date/Time   CHOL 121 05/15/2020 1616   TRIG 80.0 05/15/2020 1616   HDL 36.50 (L) 05/15/2020 1616   CHOLHDL 3 05/15/2020 1616   VLDL 16.0 05/15/2020 1616   LDLCALC 68 05/15/2020 1616    Hepatic Function Latest Ref Rng & Units 05/15/2020 06/01/2019 05/12/2019  Total Protein 6.0 - 8.3 g/dL 7.3 7.0 6.7  Albumin 3.5 - 5.2 g/dL 4.1 - 4.1  AST 0 - 37 U/L '18 18 19  ' ALT 0 - 53 U/L '19 19 17  ' Alk Phosphatase 39 - 117 U/L 73 - 77  Total Bilirubin 0.2 - 1.2 mg/dL 0.5 0.5 0.5    Lab Results  Component Value Date/Time   TSH 3.35 09/28/2019 11:43 AM   TSH 1.706 02/26/2014 04:45 AM    CBC Latest Ref Rng & Units 05/12/2019 12/09/2018 11/09/2018  WBC 4.0 - 10.5 K/uL 6.9 9.2 9.9  Hemoglobin 13.0 - 17.0 g/dL 14.4 15.4 15.4  Hematocrit 39.0 - 52.0 % 44.6 46.6 46.9  Platelets 150.0 - 400.0 K/uL 212.0 194.0 237.0    No results found for: VD25OH  Clinical ASCVD: Yes  The ASCVD Risk score (Ogden., et al., 2013) failed to calculate for the following reasons:   The 2013 ASCVD risk  score is only valid for ages 47 to 28     Social History   Tobacco Use  Smoking Status Former   Packs/day: 4.00   Years: 30.00   Pack years: 120.00   Types: Cigarettes   Quit date: 01/21/1978   Years since quitting: 42.4  Smokeless Tobacco Never   BP Readings from Last 3 Encounters:  05/15/20 110/60  01/11/20 130/66  12/09/19 124/70   Pulse Readings from Last 3 Encounters:  05/15/20 100  11/03/19 (!) 112  09/28/19 (!) 115   Wt Readings from Last 3 Encounters:  05/15/20 235 lb 12.8 oz  (107 kg)  01/11/20 255 lb (115.7 kg)  12/09/19 250 lb (113.4 kg)    Assessment: Review of patient past medical history, allergies, medications, health status, including review of consultants reports, laboratory and other test data, was performed as part of comprehensive evaluation and provision of chronic care management services.   SDOH:  (Social Determinants of Health) assessments and interventions performed:  SDOH Interventions    Flowsheet Row Most Recent Value  SDOH Interventions   Financial Strain Interventions Intervention Not Indicated  Physical Activity Interventions Other (Comments)  [limited by breathing]       CCM Care Plan  No Known Allergies  Medications Reviewed Today     Reviewed by Cherre Robins, PharmD (Pharmacist) on 06/26/20 at 58  Med List Status: <None>   Medication Order Taking? Sig Documenting Provider Last Dose Status Informant  albuterol (PROVENTIL) (2.5 MG/3ML) 0.083% nebulizer solution [id number] No Take 3 mLs (2.5 mg total) by nebulization every 6 (six) hours as needed for wheezing or shortness of breath.  Patient not taking: Reported on 06/26/2020   [name], PA-C Not Taking Active   albuterol (VENTOLIN HFA) 108 (90 Base) MCG/ACT inhaler [id number] Yes Inhale 2 puffs into the lungs every 6 (six) hours as needed for wheezing or shortness of breath. Collene Gobble, MD Taking Active   aspirin 81 MG tablet [id number] Yes Take 1 tablet (81 mg total) by mouth every morning. Stop for 1 week and then resume  Patient taking differently: Take 81 mg by mouth every morning.   Barton Dubois, MD Taking Active Self  atorvastatin (LIPITOR) 40 MG tablet [id number] Yes 1 tab po q day  Patient taking differently: Take 40 mg by mouth daily.   [name], [name], PA-C Taking Active   celecoxib (CELEBREX) 200 MG capsule [id number] Yes Take 1 capsule (200 mg total) by mouth daily as needed for moderate pain. [name], [name], PA-C Taking Active   cholecalciferol (VITAMIN  D) 1000 UNITS tablet [id number] Yes Take 2,000 Units by mouth 2 (two) times daily. [provider] Taking Active Self  enalapril (VASOTEC) 2.5 MG tablet [id number] No Take 2.5 mg by mouth every morning. Reported on 07/12/2015  Patient not taking: Reported on 06/26/2020   [provider] Not Taking Consider Medication Status and Discontinue Self  fluticasone (FLONASE) 50 MCG/ACT nasal spray [id number] Yes Place 1 spray into both nostrils daily. Collene Gobble, MD Taking Active   glucose blood (BAYER CONTOUR TEST) test strip [id number] Yes Use as instructed to check blood sugar twice a day.  DX  E11.9 [name], [name], PA-C Taking Active   guaiFENesin (MUCINEX) 600 MG 12 hr tablet [id number] No Take 1 tablet (600 mg total) by mouth 2 (two) times daily.  Patient not taking: Reported on 06/26/2020   Collene Gobble, MD Not Taking Consider Medication Status and Discontinue   Indacaterol Maleate (ARCAPTA NEOHALER) 75 MCG  CAPS [id number] Yes Place into inhaler and inhale. [provider] Taking Active   meclizine (ANTIVERT) 12.5 MG tablet [id number] Yes Take 1 tablet (12.5 mg total) by mouth 3 (three) times daily as needed for dizziness. [name], [name], PA-C Taking Active   metFORMIN (GLUCOPHAGE) 500 MG tablet [id number] Yes Take 1 tablet (500 mg total) by mouth 3 (three) times daily. [name], [name], PA-C Taking Active   misoprostol (CYTOTEC) 100 MCG tablet [id number] Yes TAKE 1 TABLET BY MOUTH TWICE DAILY (AM&PM) AND TAKE 2 TABLETS AT BEDTIME  Patient taking differently: TAKE 1 TABLET BY MOUTH TWICE DAILY in morning and noon  AND TAKE 2 TABLETS AT BEDTIME   [name], [name], PA-C Taking Active   Multiple Vitamins-Minerals (CENTRUM) tablet [id number] Yes Take 1 tablet by mouth every morning. Reported on 01/05/2015 [provider] Taking Active Self           Med Note Barbaraann Boys Jun 26, 2020  3:34 PM)    OVER THE COUNTER MEDICATION [id number] Yes Place 1-2 drops into both eyes  daily as needed (dry red eyes.). Reported on 01/05/2015 [provider] Taking Active Self  pioglitazone (ACTOS) 15 MG tablet [id number] Yes Take 1 tablet (15 mg total) by mouth daily. [name], [name], PA-C Taking Active   Tiotropium Bromide-Olodaterol (STIOLTO RESPIMAT) 2.5-2.5 MCG/ACT AERS [id number] Yes Inhale 2 puffs into the lungs daily. Collene Gobble, MD Taking Active   UNABLE TO FIND [id number] Yes CPAP [provider] Taking Active Self  venlafaxine XR (EFFEXOR-XR) 37.5 MG 24 hr capsule [id number] Yes TAKE 1 CAPSULE BY MOUTH ONCE DAILY WITH BREAKFAST [name], [name], PA-C Taking Active   vitamin C (ASCORBIC ACID) 500 MG tablet [id number] Yes Take 500 mg by mouth every morning.  [provider] Taking Active Self            Patient Active Problem List   Diagnosis Date Noted   Accident due to mechanical fall without injury 06/11/2019   Sprain of medial collateral ligament of right knee 04/08/2019   Prepatellar bursitis of right knee 04/08/2019   Chronic respiratory failure ([place]) 11/06/2017   OA (osteoarthritis) of hip 07/27/2014   Abrasion 07/27/2014   Insomnia 06/27/2014   Chronic cholecystitis 06/16/2014   Coccyx pain 03/30/2014   Diabetes mellitus type II, controlled ([place]) 03/08/2014   Depression 03/08/2014   GERD (gastroesophageal reflux disease) 03/08/2014   Hyperlipidemia 03/08/2014   Abdominal pain    Diverticulitis of large intestine without perforation or abscess without bleeding    Blood poisoning    Calculus of gallbladder with acute cholecystitis 02/27/2014   Acute cholecystitis    Diverticulitis 02/25/2014   Urinary retention 02/25/2014   Hypertension 02/25/2014   SBO (small bowel obstruction) ([place]) 02/25/2014   Sepsis ([place]) 02/25/2014   Cholecystitis 02/25/2014   Slow transit constipation 02/25/2014   Seborrheic dermatitis of scalp 11/24/2013   Diarrhea 07/12/2013   Contusion of multiple sites 05/24/2013   ED (erectile dysfunction)  of organic origin 05/06/2013   Seborrheic keratosis 05/06/2013   Cough 02/17/2013   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   Acute maxillary sinusitis 10/30/2012   Allergic rhinitis 06/27/2012   Hypertrophic and atrophic condition of skin 06/27/2012   Increased frequency of urination 06/27/2012   Lower urinary tract infectious disease 06/27/2012   Major depressive disorder, single episode, unspecified 06/27/2012   Malaise and fatigue 06/27/2012   Psychosexual dysfunction with inhibited sexual excitement 06/27/2012   Personal history of colonic polyps 01/06/2012  Special screening for malignant neoplasms, colon 11/15/2011   Pruritus ani 11/15/2011   COPD (chronic obstructive pulmonary disease) ([place]) 01/11/2011   DOE (dyspnea on exertion) 01/01/2011   Fissure in ano 12/21/2010   HYPERLIPIDEMIA 01/11/2009   Obstructive sleep apnea 01/11/2009   ARTHRITIS, RHEUMATOID 01/11/2009   OSTEOARTHRITIS 01/11/2009    Immunization History  Administered Date(s) Administered   Fluad Quad(high Dose 65+) 10/16/2018, 11/03/2019   Influenza Split 11/11/2010, 10/22/2011   Influenza Whole 01/22/2008, 11/22/2009   Influenza, High Dose Seasonal PF 09/27/2015, 11/04/2016, 10/22/2017   Influenza, Seasonal, Injecte, Preservative Fre 10/09/2010   Influenza,inj,Quad PF,6+ Mos 10/19/2012, 11/03/2013, 11/10/2014   Influenza-Unspecified 11/21/2013   Moderna Sars-Covid-2 Vaccination 02/23/2019, 03/24/2019   Pneumococcal Conjugate-13 02/15/2014, 03/27/2015   Pneumococcal Polysaccharide-23 10/31/2006, 01/22/2007   Tdap 03/27/2015    Conditions to be addressed/monitored: CAD, HTN, HLD, COPD, DMII, Depression and OSA; GERD; arthritis  Care Plan : General Pharmacy (Adult)  Updates made by Cherre Robins, PHARMD since 06/30/2020 12:00 AM     Problem: Medication and chronic care management   Priority: High  Onset Date: 06/26/2020     Long-Range Goal: Medication Management and assistance with medication costs   Start  Date: 06/26/2020  This Visit's Progress: Not on track  Priority: High  Note:   Current Barriers:  Unable to independently afford treatment regimen Unable to achieve control of COPD  Unable to self administer medications as prescribed Does not maintain contact with provider office  Pharmacist Clinical Goal(s):  Over the next 90 days, patient will verbalize ability to afford treatment regimen achieve control of COPD as evidenced by improved breathing and utilization of maintenance therapy maintain control of HTN and type 2 DM as evidenced by maintaining goals below  adhere to prescribed medication regimen as evidenced by patient report and refill report  through collaboration with PharmD and provider.   Interventions: 1:1 collaboration with [name], [name], PA-C regarding development and update of comprehensive plan of care as evidenced by provider attestation and co-signature Inter-disciplinary care team collaboration (see longitudinal plan of care) Comprehensive medication review performed; medication list updated in electronic medical record  Chronic Obstructive Pulmonary Disease: Goal: reduce symptoms of COPD and improve adherence to maintenance inhaler therapy Managed by Dr Lamonte Sakai Current regimen:  Stiolto 2 puffs once daily Albuterol inhaler as needed Albuterol nebulizer as needed Interventions: Encouraged patient to use Stiolto daily Checked to see if would qualify for patient assistance for [employer] but patient did not qualify.  Will coordinate with pulmonologist and PCP to see if there are other options with lower cost that might help improve adherence. Patient might qualify for [place] and Me Program - they could supply Bevespi which is similar to [employer] Patient self care activities - Over the next 90 days, patient will: Recommend restart Stiolto - use 2 puffs daily until we can identify lower cost alternative.  Use albuterol inhaler or nebulizer as needed for  rescue  Hypertension Controlled; maintain BP goal <140/90 Current regimen:  Diet and exercise management   Interventions: Requested patient to check blood pressure 1 to 2 times per week and record Updated medication list and removed enalapril 2.5 mg (stopped by PCP 05/15/2020 - due to hypotension) Patient self care activities - Over the next 90 days, patient will: Check blood pressure 1 to 2 times per week, document, and provide at future appointments Ensure daily salt intake < 2300 mg/day  Hyperlipidemia / Elevated cholesterol/ CAD controlled LDL goal < 70 Current regimen:  Atorvastatin 74m daily Aspirin  11m daily  Interventions:  Maintain current cholesterol medication regimen. - addressed at previous appointment  Diabetes Controlled; A1c goal <7%  Lab Results  Component Value Date   HGBA1C 6.5 05/15/2020  Current regimen:  Metformin 5074mthree times daily Pioglitazone 1510maily  Patient self care activities - Over the next 90 days, patient will: Check blood sugar once daily, document, and provide at future appointments Contact provider with any episodes of hypoglycemia (low blood glucose <80)   Medication management [facility] Comprehensive medication review performed. Continue current medication management strategy Reviewed adherence / refill history:  Has not filled Stiolto in several months; See above COPD for plan to improve adherence  Patient Goals/Self-Care Activities Over the next 90 days, patient will:  Take medications as prescribed check glucose daily , document, and provide at future appointments check blood pressure 1 to 2 times per week, document, and provide at future appointments collaborate with provider on medication access solutions  Follow Up Plan: Telephone follow up appointment with care management team member scheduled for:  3 months           Medication Assistance:  Assessing for  potentail PAP for inhaler therapy. Patient income does not qualify for assistance for Stiolto or with [employer] Anoro. He might qualify for assistance with [place] an Me - [employer] an option that is similar to [employer] [id number] [place] - [id number]: [phone]: [phone] Up:  Patient agrees to Care Plan and Follow-up.  Plan: The care management team will reach out to the patient again over the next 45 days.  TamCherre RobinsharmD Clinical Pharmacist [phone] have personally reviewed this encounter including the documentation in this note and have collaborated with the care management provider regarding care management and care coordination activities to include development and update of the comprehensive care plan. I am certifying that I agree with the content of this note and encounter as supervising provider  EdwMackie PaiA-C .

## 2020-06-30 NOTE — Patient Instructions (Signed)
Visit Information [name],   It was a pleasure speaking with you today. Please feel free to contact me if you have any questions or concerns. Below is information regarding your health goals.  I will contact you once I hear from DR Byrum about the switch from Stiolto to [employer] inhaler.   Cherre Robins, PharmD Clinical Pharmacist [facility] [address] [facility] [phone]  PATIENT GOALS:  Goals Addressed             This Visit's Progress    Chronic Care Management Pharmacy Care Plan   Not on track    CARE PLAN ENTRY  Current Barriers:  Chronic Disease Management support, education, and care coordination needs related to COPD, type 2 Diabetes, Hypertension, Elevated cholesterol, depression, Osteoarthritis   Hypertension Pharmacist Clinical Goal(s): Over the next 90 days, patient will work with PharmD and providers to maintain BP goal <140/90 BP Readings from Last 3 Encounters:  05/15/20 110/60  01/11/20 130/66  12/09/19 124/70  Current regimen:  Diet and exercise management   Interventions: Requested patient to check blood pressure 1 to 2 times per week and record Updated medication list and removed enalapril 2.5 mg  Patient self care activities - Over the next 90 days, patient will: Check blood pressure 1 to 2 times per week, document, and provide at future appointments Ensure daily salt intake < 2300 mg/day  Hyperlipidemia / Elevated cholesterol Pharmacist Clinical Goal(s): Over the next 180 days, patient will work with PharmD and providers to maintain LDL goal < 70  Lipid Panel     Component Value Date/Time   CHOL 121 05/15/2020 1616   TRIG 80.0 05/15/2020 1616   HDL 36.50 (L) 05/15/2020 1616   CHOLHDL 3 05/15/2020 1616   VLDL 16.0 05/15/2020 1616   LDLCALC 68 05/15/2020 1616  Current regimen:  Atorvastatin 40mg  daily Patient self care activities - Over the next 180 days, patient will: Maintain current cholesterol medication regimen.    Diabetes Pharmacist Clinical Goal(s): Over the next 90 days, patient will work with PharmD and providers to maintain A1c goal <7%  Lab Results  Component Value Date   HGBA1C 6.5 05/15/2020  Current regimen:  Metformin 500mg  three times daily Pioglitazone 15mg  daily  Patient self care activities - Over the next 90 days, patient will: Check blood sugar once daily, document, and provide at future appointments Contact provider with any episodes of hypoglycemia (low blood glucose <80)  COPD Pharmacist Clinical Goal(s) Over the next 90 days, patient will work with PharmD and providers to reduce symptoms of COPD Current regimen:  Stiolto 2 puffs once daily Albuterol inhaler as needed Albuterol nebulizer as needed Interventions: Encouraged patient to use Stiolto daily Checked to see if would qualify for patient assistance for [employer] but patient did not qualify.  Will coordinate with pulmonologist and PCP to see if there are other options with lower cost that might help improve adherence. Patient might qualify for [place] and Me Program - they could supply Bevespi which is similar to [employer] Patient self care activities - Over the next 90 days, patient will: Recommend restart Stiolto - use 2 puffs daily until we can identify lower cost alternative.  Use albuterol inhaler or nebulizer as needed for rescue  Medication management Pharmacist Clinical Goal(s): Over the next 90 days, patient will work with PharmD and providers to achieve optimal medication adherence Current pharmacy: [facility] Comprehensive medication review performed. Continue current medication management strategy Patient self care activities -  Over the next 90 days, patient will: Focus on medication adherence by filling medications appropriately  Take medications as prescribed Report any questions or concerns to PharmD and/or provider(s)  Please see past updates related to this goal by  clicking on the "Past Updates" button in the selected goal           The patient verbalized understanding of instructions, educational materials, and care plan provided today and agreed to receive a mailed copy of patient instructions, educational materials, and care plan.   Pharmacy team will follow up with patient regarding Dr Agustina Caroli recommendation for inhaler Clinical pharmacist will check in by phone again in 3 to 4 months unless needed sooner.

## 2020-07-07 ENCOUNTER — Telehealth: Payer: Self-pay | Admitting: Medical

## 2020-07-07 MED ORDER — VENLAFAXINE HCL ER 37.5 MG PO CP24: 37.5000 mg | ORAL_CAPSULE | Freq: Every day | ORAL | 3 refills | Status: DC

## 2020-07-07 NOTE — Telephone Encounter (Signed)
Medication:venlafaxine XR (EFFEXOR-XR) 37.5 MG 24 hr capsule [[id number]]     Has the patient contacted their pharmacy? no (If no, request that the patient contact the pharmacy for the refill.) (If yes, when and what did the pharmacy advise?)    Preferred Pharmacy (with phone number or street name):  [id number] - [place], [place] - [address] AT [place] Phone:  [phone]  Fax:  [phone]       Agent: Please be advised that RX refills may take up to 3 business days. We ask that you follow-up with your pharmacy.

## 2020-07-07 NOTE — Telephone Encounter (Signed)
Rx sent 

## 2020-07-17 DIAGNOSIS — H04123 Dry eye syndrome of bilateral lacrimal glands: Secondary | ICD-10-CM | POA: Diagnosis not present

## 2020-07-17 DIAGNOSIS — E119 Type 2 diabetes mellitus without complications: Secondary | ICD-10-CM | POA: Diagnosis not present

## 2020-07-17 DIAGNOSIS — H524 Presbyopia: Secondary | ICD-10-CM | POA: Diagnosis not present

## 2020-07-17 DIAGNOSIS — Z961 Presence of intraocular lens: Secondary | ICD-10-CM | POA: Diagnosis not present

## 2020-07-19 ENCOUNTER — Other Ambulatory Visit: Payer: Self-pay | Admitting: Medical

## 2020-07-22 ENCOUNTER — Other Ambulatory Visit: Payer: Self-pay | Admitting: Medical

## 2020-08-14 ENCOUNTER — Telehealth: Payer: Self-pay | Admitting: Medical

## 2020-08-14 ENCOUNTER — Other Ambulatory Visit: Payer: Self-pay

## 2020-08-14 ENCOUNTER — Ambulatory Visit: Payer: Medicare Other | Admitting: Medical

## 2020-08-14 VITALS — BP 128/75 | HR 100 | Resp 20 | Ht 73.0 in | Wt 237.0 lb

## 2020-08-14 DIAGNOSIS — I10 Essential (primary) hypertension: Secondary | ICD-10-CM

## 2020-08-14 DIAGNOSIS — E785 Hyperlipidemia, unspecified: Secondary | ICD-10-CM | POA: Diagnosis not present

## 2020-08-14 DIAGNOSIS — E118 Type 2 diabetes mellitus with unspecified complications: Secondary | ICD-10-CM

## 2020-08-14 DIAGNOSIS — J449 Chronic obstructive pulmonary disease, unspecified: Secondary | ICD-10-CM | POA: Diagnosis not present

## 2020-08-14 DIAGNOSIS — Z794 Long term (current) use of insulin: Secondary | ICD-10-CM | POA: Diagnosis not present

## 2020-08-14 MED ORDER — ATORVASTATIN CALCIUM 40 MG PO TABS: ORAL_TABLET | ORAL | 3 refills | Status: DC

## 2020-08-14 NOTE — Patient Instructions (Addendum)
History of diabetes and last A1c was very well controlled at 6.5.  Continue pioglitazone and repeat [id number] with metabolic panel today.  Hopefully you will still be well controlled.  Vaccine counseling regarding getting fourth booster.  Timing now versus early fall when newest vaccine update probably will be authorized.  History of hyperlipidemia.  Well-controlled except for slight low HDL cholesterol.  Refill atorvastatin 40 mg today.  History of hypertension.  Blood pressure well controlled today.  History of COPD.  Continue on O2 and current inhalers.  Follow-up with pulmonologist.  If no appointment scheduled please call pulmonologist office to get scheduled.  Resolved left calf cramping.  Diabetic foot exam done today.  No breakdown or lesions.  Refilled venlafaxine/Effexor last month.  History of some anxiety on review.  If this has helped can continue.  Follow-up in 3 months or as needed.

## 2020-08-14 NOTE — Telephone Encounter (Signed)
Saw pt today and they got call from someone about meds. Looks and saw you left a voice mail. They are wondering what call was about. Would you reach out to them again if needed. They got impression you had recommendations on copd meds?

## 2020-08-14 NOTE — Progress Notes (Signed)
Subjective:    Patient ID: Lance Powell, male    DOB: 03-26-1929, 87 y.o.   MRN: [id number]  HPI Vaccine counseling on covid about 4th booster. No upcoming travel. Had 2 covid vaccine with booster.   Pt has diabetes. He is on metformin. Recent piaglitazone cost too much. States 12 tab cost $70 tablets. No hyperglycemic signs or symptoms.     Pt has mild elevated triglycerides. Will repeat lipid panel today. Pt on atorvastatin 40 mg daily.   Bp well controlled today.   Copd hx. Pt on 02. Sees pulmonologist. On stioloto inhaler. Pt daughter states will get him scheduled.   Rare calf muscle cramp left side.   Review of Systems  Constitutional:  Negative for chills, fatigue and fever.  HENT:  Negative for congestion.   Respiratory:  Negative for cough, chest tightness, shortness of breath and wheezing.   Cardiovascular:  Negative for chest pain and palpitations.  Gastrointestinal:  Negative for abdominal pain, blood in stool, constipation, diarrhea and nausea.  Musculoskeletal:  Negative for back pain, myalgias and neck stiffness.  Skin:  Negative for rash.  Neurological:  Negative for dizziness, tremors, seizures, speech difficulty and numbness.  Psychiatric/Behavioral:  Negative for behavioral problems, dysphoric mood and suicidal ideas. The patient is not hyperactive.     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   COPD (chronic obstructive pulmonary disease) (HCC)    Depression 03/08/2014   Diabetes mellitus    GERD (gastroesophageal reflux disease) 03/08/2014   History of blood transfusion    Hyperlipemia    Hypertension    OSA (obstructive sleep apnea)    Osteoarthritis    Rheumatoid arthritis(714.0)    Sleep apnea    cpap     Social History   Socioeconomic History   Marital status: Married    Spouse name: Not on file   Number of children: 6   Years of education: Not on file   Highest education level: Not on file  Occupational  History   Occupation: Retired    Comment: Chief Financial Officer  Tobacco Use   Smoking status: Former    Packs/day: 4.00    Years: 30.00    Pack years: 120.00    Types: Cigarettes    Quit date: 01/21/1978    Years since quitting: 42.5   Smokeless tobacco: Never  Vaping Use   Vaping Use: Never used  Substance and Sexual Activity   Alcohol use: No   Drug use: No   Sexual activity: Not on file  Other Topics Concern   Not on file  Social History Narrative   Right Handed   Lives in a one story home   Drinks no caffeine    Social Determinants of Health   Financial Resource Strain: Low Risk    Difficulty of Paying Living Expenses: Not very hard  Food Insecurity: Not on file  Transportation Needs: Not on file  Physical Activity: Inactive   Days of Exercise per Week: 0 days   Minutes of Exercise per Session: 0 min  Stress: Not on file  Social Connections: Not on file  Intimate Partner Violence: Not on file    Past Surgical History:  Procedure Laterality Date   [place] N/A 06/16/2014   Procedure: LAPAROSCOPIC CHOLECYSTECTOMY;  Surgeon: Greer Pickerel, MD;  Location: [facility];  Service: General;  Laterality: N/A;   CYSTOSCOPY  01/23/2011   Procedure: CYSTOSCOPY;  Surgeon: Molli Hazard, MD;  Location: [place];  Service: Urology;  Laterality: N/A;   FLEXIBLE SIGMOIDOSCOPY  01/07/2012   Procedure: FLEXIBLE SIGMOIDOSCOPY;  Surgeon: Ladene Artist, MD,FACG;  Location: [facility];  Service: Endoscopy;  Laterality: N/A;   HERNIA REPAIR  1990   right and left inguinal   NISSEN FUNDOPLICATION     SIGMOIDECTOMY  2004   colovesical fistula   TONSILLECTOMY  1937   TRANSURETHRAL RESECTION OF PROSTATE  01/23/2011   Procedure: TRANSURETHRAL RESECTION OF THE PROSTATE WITH GYRUS INSTRUMENTS;  Surgeon: Molli Hazard, MD;  Location[facility];  Service: Urology;  Laterality: N/A;   VASECTOMY  1972    Family History  Problem  Relation Age of Onset   Heart disease Mother    Emphysema Brother    Cancer Daughter        breast    No Known Allergies  Current Outpatient Medications on File Prior to Visit  Medication Sig Dispense Refill   albuterol (PROVENTIL) (2.5 MG/3ML) 0.083% nebulizer solution Take 3 mLs (2.5 mg total) by nebulization every 6 (six) hours as needed for wheezing or shortness of breath. (Patient not taking: Reported on 06/26/2020) 150 mL 1   albuterol (VENTOLIN HFA) 108 (90 Base) MCG/ACT inhaler Inhale 2 puffs into the lungs every 6 (six) hours as needed for wheezing or shortness of breath. 8 g 2   aspirin 81 MG tablet Take 1 tablet (81 mg total) by mouth every morning. Stop for 1 week and then resume (Patient taking differently: Take 81 mg by mouth every morning.)     celecoxib (CELEBREX) 200 MG capsule TAKE 1 CAPSULE(200 MG) BY MOUTH DAILY AS NEEDED FOR MODERATE PAIN 30 capsule 1   cholecalciferol (VITAMIN D) 1000 UNITS tablet Take 2,000 Units by mouth 2 (two) times daily.     enalapril (VASOTEC) 2.5 MG tablet Take 2.5 mg by mouth every morning. Reported on 07/12/2015     fluticasone (FLONASE) 50 MCG/ACT nasal spray Place 1 spray into both nostrils daily. 16 g 2   glucose blood (BAYER CONTOUR TEST) test strip Use as instructed to check blood sugar twice a day.  DX  E11.9 100 each 5   guaiFENesin (MUCINEX) 600 MG 12 hr tablet Take 1 tablet (600 mg total) by mouth 2 (two) times daily. 60 tablet 2   meclizine (ANTIVERT) 12.5 MG tablet Take 1 tablet (12.5 mg total) by mouth 3 (three) times daily as needed for dizziness. 30 tablet 0   metFORMIN (GLUCOPHAGE) 500 MG tablet Take 1 tablet (500 mg total) by mouth 3 (three) times daily. 90 tablet 3   misoprostol (CYTOTEC) 100 MCG tablet TAKE 1 TABLET BY MOUTH TWICE DAILY (AM&PM) AND TAKE 2 TABLETS AT BEDTIME (Patient taking differently: TAKE 1 TABLET BY MOUTH TWICE DAILY in morning and noon  AND TAKE 2 TABLETS AT BEDTIME) 360 tablet 3   Multiple Vitamins-Minerals  (CENTRUM) tablet Take 1 tablet by mouth every morning. Reported on 01/05/2015     OVER THE COUNTER MEDICATION Place 1-2 drops into both eyes daily as needed (dry red eyes.). Reported on 01/05/2015     pioglitazone (ACTOS) 15 MG tablet Take 1 tablet (15 mg total) by mouth daily. 90 tablet 3   Tiotropium Bromide-Olodaterol (STIOLTO RESPIMAT) 2.5-2.5 MCG/ACT AERS Inhale 2 puffs into the lungs daily. 4 g 6   UNABLE TO FIND CPAP     venlafaxine XR (EFFEXOR-XR) 37.5 MG 24 hr capsule Take 1 capsule (37.5 mg total) by mouth daily with  breakfast. 90 capsule 3   vitamin C (ASCORBIC ACID) 500 MG tablet Take 500 mg by mouth every morning.      No current facility-administered medications on file prior to visit.    BP 128/75   Pulse 100   Resp 20   Ht '6\' 1"'$  (1.854 m)   Wt 237 lb (107.5 kg)   SpO2 91%   BMI 31.27 kg/m        Objective:   Physical Exam  General Mental Status- Alert. General Appearance- Not in acute distress.   Skin General: Color- Normal Color. Moisture- Normal Moisture.  Neck Carotid Arteries- Normal color. Moisture- Normal Moisture. No carotid bruits. No JVD.  Chest and Lung Exam Auscultation: Breath Sounds:-Normal.  Cardiovascular Auscultation:Rythm- Regular. Murmurs & Other Heart Sounds:Auscultation of the heart reveals- No Murmurs.  Abdomen Inspection:-Inspeection Normal. Palpation/Percussion:Note:No mass. Palpation and Percussion of the abdomen reveal- Non Tender, Non Distended + BS, no rebound or guarding.    Neurologic Cranial Nerve exam:- CN III-XII intact(No nystagmus), symmetric smile. Drift Test:- No drift. Romberg Exam:- Negative.  Heal to Toe Gait exam:-Normal. Finger to Nose:- Normal/Intact Strength:- 5/5 equal and symmetric strength both upper and lower extremities.       Assessment & Plan:   History of diabetes and last A1c was very well controlled at 6.5.  Continue pioglitazone and repeat 123456 with metabolic panel today.  Hopefully you  will still be well controlled.  Vaccine counseling regarding getting fourth booster.  Timing now versus early fall when newest vaccine update probably will be authorized.  History of hyperlipidemia.  Well-controlled except for slight low HDL cholesterol.  Refill atorvastatin 40 mg today.  History of hypertension.  Blood pressure well controlled today.  History of COPD.  Continue on O2 and current inhalers.  Follow-up with pulmonologist.  If no appointment scheduled please call pulmonologist office to get scheduled.  Resolved left calf cramping.  Diabetic foot exam done today.  No breakdown or lesions.  Refilled venlafaxine/Effexor last month.  History of some anxiety on review.  If this has helped can continue.  Follow-up in 3 months or as needed.  Time spent with patient today was 40  minutes which consisted of chart revdiew, discussing diagnosis, work up treatment and documentation.

## 2020-08-15 LAB — COMPREHENSIVE METABOLIC PANEL
ALT: 14 U/L (ref 0–53)
AST: 14 U/L (ref 0–37)
Albumin: 4.1 g/dL (ref 3.5–5.2)
Alkaline Phosphatase: 87 U/L (ref 39–117)
BUN: 26 mg/dL — ABNORMAL HIGH (ref 6–23)
CO2: 32 mEq/L (ref 19–32)
Calcium: 10.8 mg/dL — ABNORMAL HIGH (ref 8.4–10.5)
Chloride: 102 mEq/L (ref 96–112)
Creatinine, Ser: 1.23 mg/dL (ref 0.40–1.50)
GFR: 52.28 mL/min — ABNORMAL LOW (ref >60.00)
Glucose, Bld: 141 mg/dL — ABNORMAL HIGH (ref 70–99)
Potassium: 5.4 mEq/L — ABNORMAL HIGH (ref 3.5–5.1)
Sodium: 141 mEq/L (ref 135–145)
Total Bilirubin: 0.4 mg/dL (ref 0.2–1.2)
Total Protein: 6.8 g/dL (ref 6.0–8.3)

## 2020-08-15 LAB — HEMOGLOBIN A1C: Hgb A1c MFr Bld: 6.9 % — ABNORMAL HIGH (ref 4.6–6.5)

## 2020-08-15 LAB — LIPID PANEL
Cholesterol: 138 mg/dL (ref 0–200)
HDL: 38.3 mg/dL — ABNORMAL LOW (ref >39.00)
LDL Cholesterol: 75 mg/dL (ref 0–99)
NonHDL: 99.46
Total CHOL/HDL Ratio: 4
Triglycerides: 121 mg/dL (ref 0.0–149.0)
VLDL: 24.2 mg/dL (ref 0.0–40.0)

## 2020-08-16 ENCOUNTER — Telehealth: Payer: Self-pay | Admitting: Medical

## 2020-08-16 DIAGNOSIS — E875 Hyperkalemia: Secondary | ICD-10-CM

## 2020-08-16 MED ORDER — SODIUM POLYSTYRENE SULFONATE 15 GM/60ML PO SUSP
ORAL | 0 refills | Status: DC
  Filled 2020-08-17: qty 240, 4d supply, fill #0

## 2020-08-16 NOTE — Telephone Encounter (Signed)
Future cmp placed and rx kayexalate sent.

## 2020-08-17 ENCOUNTER — Telehealth: Payer: Self-pay | Admitting: Medical

## 2020-08-17 ENCOUNTER — Other Ambulatory Visit (HOSPITAL_BASED_OUTPATIENT_CLINIC_OR_DEPARTMENT_OTHER): Payer: Self-pay

## 2020-08-17 NOTE — Telephone Encounter (Signed)
Maureen notified of changes

## 2020-08-17 NOTE — Telephone Encounter (Signed)
Daughter called and lvm to return call

## 2020-08-17 NOTE — Telephone Encounter (Signed)
Lance Powell is calling to get clarification on Sodium polystyrene. She states he takes a potasium pill along with a banana every day. Please advise

## 2020-08-17 NOTE — Telephone Encounter (Signed)
Spoke with daughter states Lance Powell is taking a multi-vitamin that includes potassium in it with other vitamins , is it okay for him to take that as well? & also wants to know does he still need to stop the banana even though he isnt on potassium supplements alone

## 2020-08-24 ENCOUNTER — Other Ambulatory Visit (HOSPITAL_BASED_OUTPATIENT_CLINIC_OR_DEPARTMENT_OTHER): Payer: Self-pay

## 2020-08-31 NOTE — Addendum Note (Signed)
Addended by: Kelle Darting A on: 08/31/2020 02:58 PM   Modules accepted: Orders

## 2020-09-01 ENCOUNTER — Other Ambulatory Visit: Payer: Self-pay

## 2020-09-01 ENCOUNTER — Other Ambulatory Visit (INDEPENDENT_AMBULATORY_CARE_PROVIDER_SITE_OTHER): Payer: Medicare Other

## 2020-09-01 DIAGNOSIS — E875 Hyperkalemia: Secondary | ICD-10-CM

## 2020-09-01 LAB — COMPREHENSIVE METABOLIC PANEL
AG Ratio: 1.6 (calc) (ref 1.0–2.5)
ALT: 17 U/L (ref 9–46)
AST: 16 U/L (ref 10–35)
Albumin: 4.2 g/dL (ref 3.6–5.1)
Alkaline phosphatase (APISO): 80 U/L (ref 35–144)
BUN: 18 mg/dL (ref 7–25)
CO2: 30 mmol/L (ref 20–32)
Calcium: 10.5 mg/dL — ABNORMAL HIGH (ref 8.6–10.3)
Chloride: 102 mmol/L (ref 98–110)
Creat: 1.21 mg/dL (ref 0.70–1.22)
Globulin: 2.7 g/dL (calc) (ref 1.9–3.7)
Glucose, Bld: 182 mg/dL — ABNORMAL HIGH (ref 65–99)
Potassium: 4.4 mmol/L (ref 3.5–5.3)
Sodium: 142 mmol/L (ref 135–146)
Total Bilirubin: 0.6 mg/dL (ref 0.2–1.2)
Total Protein: 6.9 g/dL (ref 6.1–8.1)

## 2020-09-07 DIAGNOSIS — G4733 Obstructive sleep apnea (adult) (pediatric): Secondary | ICD-10-CM | POA: Diagnosis not present

## 2020-09-07 DIAGNOSIS — J9610 Chronic respiratory failure, unspecified whether with hypoxia or hypercapnia: Secondary | ICD-10-CM | POA: Diagnosis not present

## 2020-09-07 DIAGNOSIS — J449 Chronic obstructive pulmonary disease, unspecified: Secondary | ICD-10-CM | POA: Diagnosis not present

## 2020-09-08 ENCOUNTER — Telehealth: Payer: Self-pay

## 2020-09-08 NOTE — Telephone Encounter (Signed)
Patient called back wondering if he is able to start back eating bananas .

## 2020-09-08 NOTE — Telephone Encounter (Signed)
Patient called wanting labs mailed to him

## 2020-09-08 NOTE — Telephone Encounter (Signed)
Pt called and notified

## 2020-09-26 ENCOUNTER — Ambulatory Visit (INDEPENDENT_AMBULATORY_CARE_PROVIDER_SITE_OTHER): Payer: Medicare Other | Admitting: Pharmacist

## 2020-09-26 ENCOUNTER — Other Ambulatory Visit: Payer: Self-pay

## 2020-09-26 DIAGNOSIS — E118 Type 2 diabetes mellitus with unspecified complications: Secondary | ICD-10-CM

## 2020-09-26 DIAGNOSIS — I10 Essential (primary) hypertension: Secondary | ICD-10-CM

## 2020-09-26 DIAGNOSIS — E785 Hyperlipidemia, unspecified: Secondary | ICD-10-CM

## 2020-09-26 DIAGNOSIS — J449 Chronic obstructive pulmonary disease, unspecified: Secondary | ICD-10-CM

## 2020-09-26 DIAGNOSIS — Z794 Long term (current) use of insulin: Secondary | ICD-10-CM

## 2020-09-26 NOTE — Patient Instructions (Signed)
Visit Information  PATIENT GOALS:  Goals Addressed             This Visit's Progress    Chronic Care Management Pharmacy Care Plan   On track    CARE PLAN ENTRY  Current Barriers:  Chronic Disease Management support, education, and care coordination needs related to COPD, type 2 Diabetes, Hypertension, Elevated cholesterol, depression, Osteoarthritis   Hypertension Pharmacist Clinical Goal(s): Over the next 90 days, patient will work with PharmD and providers to maintain BP goal <140/90 BP Readings from Last 3 Encounters:  08/14/20 128/75  05/15/20 110/60  01/11/20 130/66  Current regimen:  Diet and exercise management   Interventions: Requested patient to check blood pressure 1 to 2 times per week and record Updated medication list and removed enalapril 2.5 mg  Patient self care activities - Over the next 90 days, patient will: Check blood pressure 1 to 2 times per week, document, and provide at future appointments Ensure daily salt intake < 2300 mg/day  Hyperlipidemia / Elevated cholesterol Pharmacist Clinical Goal(s): Over the next 180 days, patient will work with PharmD and providers to achieve LDL goal < 70  Lipid Panel     Component Value Date/Time   CHOL 138 08/14/2020 1530   TRIG 121.0 08/14/2020 1530   HDL 38.30 (L) 08/14/2020 1530   CHOLHDL 4 08/14/2020 1530   VLDL 24.2 08/14/2020 1530   [place] 75 08/14/2020 1530  Current regimen:  Atorvastatin '40mg'$  daily Patient self care activities - Over the next 180 days, patient will: Maintain current cholesterol medication regimen.   Diabetes Pharmacist Clinical Goal(s): Over the next 90 days, patient will work with PharmD and providers to maintain A1c goal <7%  Lab Results  Component Value Date   HGBA1C 6.9 (H) 08/14/2020  Current regimen:  Metformin '500mg'$  three times daily Pioglitazone '15mg'$  daily  Patient self care activities - Over the next 90 days, patient will: Check blood sugar once daily, document,  and provide at future appointments Contact provider with any episodes of hypoglycemia (low blood glucose <80)  COPD Pharmacist Clinical Goal(s) Over the next 90 days, patient will work with PharmD and providers to reduce symptoms of COPD Current regimen:  Stiolto 2 puffs once daily Albuterol inhaler as needed Albuterol nebulizer as needed Interventions: Encouraged patient to use Stiolto daily Reminded to make follow up appointment with pulmonologist - Dr Lamonte Sakai  Patient self care activities - Over the next 90 days, patient will: Recommend restart Stiolto - use 2 puffs daily until we can identify lower cost alternative.  Use albuterol inhaler or nebulizer as needed for rescue Make follow up appointment with Dr Lamonte Sakai - [phone]  Medication management Pharmacist Clinical Goal(s): Over the next 90 days, patient will work with PharmD and providers to achieve optimal medication adherence Current pharmacy: [facility] Interventions Comprehensive medication review performed. Continue current medication management strategy Patient self care activities - Over the next 90 days, patient will: Focus on medication adherence by filling medications appropriately  Take medications as prescribed Report any questions or concerns to PharmD and/or provider(s)  Please see past updates related to this goal by clicking on the "Past Updates" button in the selected goal          Patient verbalizes understanding of instructions provided today and agrees to view in [place].   Telephone follow up appointment with care management team member scheduled for: 37 days  Cherre Robins, PharmD Clinical Pharmacist [facility] [place] [facility]

## 2020-09-26 NOTE — Chronic Care Management (AMB) (Addendum)
Chronic Care Management Pharmacy Note  09/26/2020 Name:  Lance Powell MRN:  [id number] DOB:  09-24-1929  Summary: Patient has not been using inhalers for COPD or albuterol for nebulizer. Only using supplemental O2 daily.  Recommendations/Changes made from today's visit: Discussed adherence with Stiolto. Patient reports partly due to medication cost and partly just that he doesn't remember to use inhalers. Recommended follow up with pulmonologist - Lance Powell  Subjective: Lance Powell is an 87 y.o. year old male who is a primary patient of Lance Powell.  The CCM team was consulted for assistance with disease management and care coordination needs.    Engaged with patient by telephone for follow up visit in response to provider referral for pharmacy case management and/or care coordination services.   Consent to Services:  The patient was given information about Chronic Care Management services, agreed to services, and gave verbal consent prior to initiation of services.  Please see initial visit note for detailed documentation.   Patient Care Team: [name], Lance Powell as PCP - General (Physician Assistant) Lance Gobble, MD as Consulting Physician (Pulmonary Disease) Lance Guys, MD as Consulting Physician (Ophthalmology) Lance Sprang, MD as Consulting Physician (Neurology) Lance Powell, PharmD (Pharmacist)  Recent office visits: 08/14/2020 - PCP (Lance Powell) F/U chronic conditions. Prescribed Kayexalate for elevated potassium. 06/25/2020 - PCP (Lance Powell) - patient reported he had not been taking enalapril. Recommended to not restart to prevent hypotension  Recent consult visits: 07/17/2020 - Ophthalmology (Lance Gershon Crane) Diabetic eye exam. No retinopathy OU  Hospital visits: None in previous 6 months  Objective:  Lab Results  Component Value Date   CREATININE 1.21 09/01/2020   CREATININE 1.23 08/14/2020   CREATININE 1.21 05/15/2020    Lab  Results  Component Value Date   HGBA1C 6.9 (H) 08/14/2020   Last diabetic Eye exam:  Lab Results  Component Value Date/Time   HMDIABEYEEXA No Retinopathy 12/21/2015 12:00 AM    Last diabetic Foot exam: No results found for: HMDIABFOOTEX      Component Value Date/Time   CHOL 138 08/14/2020 1530   TRIG 121.0 08/14/2020 1530   HDL 38.30 (L) 08/14/2020 1530   CHOLHDL 4 08/14/2020 1530   VLDL 24.2 08/14/2020 1530   LDLCALC 75 08/14/2020 1530    Hepatic Function Latest Ref Rng & Units 09/01/2020 08/14/2020 05/15/2020  Total Protein 6.1 - 8.1 g/dL 6.9 6.8 7.3  Albumin 3.5 - 5.2 g/dL - 4.1 4.1  AST 10 - 35 U/L '16 14 18  ' ALT 9 - 46 U/L '17 14 19  ' Alk Phosphatase 39 - 117 U/L - 87 73  Total Bilirubin 0.2 - 1.2 mg/dL 0.6 0.4 0.5    Lab Results  Component Value Date/Time   TSH 3.35 09/28/2019 11:43 AM   TSH 1.706 02/26/2014 04:45 AM    CBC Latest Ref Rng & Units 05/12/2019 12/09/2018 11/09/2018  WBC 4.0 - 10.5 K/uL 6.9 9.2 9.9  Hemoglobin 13.0 - 17.0 g/dL 14.4 15.4 15.4  Hematocrit 39.0 - 52.0 % 44.6 46.6 46.9  Platelets 150.0 - 400.0 K/uL 212.0 194.0 237.0    No results found for: VD25OH  Clinical ASCVD: Yes  The ASCVD Risk score ([place]., et al., 2013) failed to calculate for the following reasons:   The 2013 ASCVD risk score is only valid for ages 73 to 1     Social History   Tobacco Use  Smoking Status Former   Packs/day: 4.00   Years:  30.00   Pack years: 120.00   Types: Cigarettes   Quit date: 01/21/1978   Years since quitting: 42.7  Smokeless Tobacco Never   BP Readings from Last 3 Encounters:  08/14/20 128/75  05/15/20 110/60  01/11/20 130/66   Pulse Readings from Last 3 Encounters:  08/14/20 100  05/15/20 100  11/03/19 (!) 112   Wt Readings from Last 3 Encounters:  08/14/20 237 lb (107.5 kg)  05/15/20 235 lb 12.8 oz (107 kg)  01/11/20 255 lb (115.7 kg)    Assessment: Review of patient past medical history, allergies, medications, health status,  including review of consultants reports, laboratory and other test data, was performed as part of comprehensive evaluation and provision of chronic care management services.   SDOH:  (Social Determinants of Health) assessments and interventions performed:     CCM Care Plan  No Known Allergies  Medications Reviewed Today     Reviewed by Lance Powell, PharmD (Pharmacist) on 09/26/20 at 87  Med List Status: <None>   Medication Order Taking? Sig Documenting Provider Last Dose Status Informant  albuterol (PROVENTIL) (2.5 MG/3ML) 0.083% nebulizer solution [id number] Yes Take 3 mLs (2.5 mg total) by nebulization every 6 (six) hours as needed for wheezing or shortness of breath. [name], [name], PA-C Taking Active   albuterol (VENTOLIN HFA) 108 (90 Base) MCG/ACT inhaler [id number] Yes Inhale 2 puffs into the lungs every 6 (six) hours as needed for wheezing or shortness of breath. Lance Gobble, MD Taking Active   aspirin 81 MG tablet [id number] Yes Take 1 tablet (81 mg total) by mouth every morning. Stop for 1 week and then resume  Patient taking differently: Take 81 mg by mouth every morning.   Barton Dubois, MD Taking Active   atorvastatin (LIPITOR) 40 MG tablet [id number] Yes 1 tab po q day [name], Powell Taking Active   celecoxib (CELEBREX) 200 MG capsule [id number] Yes TAKE 1 CAPSULE(200 MG) BY MOUTH DAILY AS NEEDED FOR MODERATE PAIN [name], [name], PA-C Taking Active   cholecalciferol (VITAMIN D) 1000 UNITS tablet [id number] Yes Take 2,000 Units by mouth 2 (two) times daily. [provider] Taking Active Self  enalapril (VASOTEC) 2.5 MG tablet [id number] No Take 2.5 mg by mouth every morning. Reported on 07/12/2015  Patient not taking: Reported on 09/26/2020   [provider] Not Taking Consider Medication Status and Discontinue   fluticasone (FLONASE) 50 MCG/ACT nasal spray [id number] Yes Place 1 spray into both nostrils daily. Lance Gobble, MD Taking Active   glucose  blood (BAYER CONTOUR TEST) test strip [id number] Yes Use as instructed to check blood sugar twice a day.  DX  E11.9 [name], [name], PA-C Taking Active   guaiFENesin (MUCINEX) 600 MG 12 hr tablet [id number] No Take 1 tablet (600 mg total) by mouth 2 (two) times daily.  Patient not taking: Reported on 09/26/2020   Lance Gobble, MD Not Taking Active   meclizine (ANTIVERT) 12.5 MG tablet [id number] Yes Take 1 tablet (12.5 mg total) by mouth 3 (three) times daily as needed for dizziness. [name], [name], PA-C Taking Active   metFORMIN (GLUCOPHAGE) 500 MG tablet [id number] Yes Take 1 tablet (500 mg total) by mouth 3 (three) times daily. [name], [name], PA-C Taking Active   misoprostol (CYTOTEC) 100 MCG tablet [id number] Yes TAKE 1 TABLET BY MOUTH TWICE DAILY (AM&PM) AND TAKE 2 TABLETS AT BEDTIME  Patient taking differently: TAKE 1 TABLET BY MOUTH TWICE DAILY in morning and noon  AND TAKE 2 TABLETS AT BEDTIME  [name], [name], PA-C Taking Active   Multiple Vitamins-Minerals (CENTRUM) tablet [id number] Yes Take 1 tablet by mouth every morning. Reported on 01/05/2015 [provider] Taking Active Self           Med Note Barbaraann Boys Jun 26, 2020  3:34 PM)    OVER THE COUNTER MEDICATION [id number] Yes Place 1-2 drops into both eyes daily as needed (dry red eyes.). Reported on 01/05/2015 [provider] Taking Active Self  pioglitazone (ACTOS) 15 MG tablet [id number] Yes Take 1 tablet (15 mg total) by mouth daily. [name], [name], PA-C Taking Active   Tiotropium Bromide-Olodaterol (STIOLTO RESPIMAT) 2.5-2.5 MCG/ACT AERS [id number] Yes Inhale 2 puffs into the lungs daily. Lance Gobble, MD Taking Active   UNABLE TO FIND [id number] Yes CPAP [provider] Taking Active Self  venlafaxine XR (EFFEXOR-XR) 37.5 MG 24 hr capsule [id number] Yes Take 1 capsule (37.5 mg total) by mouth daily with breakfast. [name], [name], PA-C Taking Active   vitamin C (ASCORBIC ACID) 500 MG tablet  [id number] Yes Take 500 mg by mouth every morning.  [provider] Taking Active Self            Patient Active Problem List   Diagnosis Date Noted   Accident due to mechanical fall without injury 06/11/2019   Sprain of medial collateral ligament of right knee 04/08/2019   Prepatellar bursitis of right knee 04/08/2019   Chronic respiratory failure ([place]) 11/06/2017   OA (osteoarthritis) of hip 07/27/2014   Abrasion 07/27/2014   Insomnia 06/27/2014   Chronic cholecystitis 06/16/2014   Coccyx pain 03/30/2014   Diabetes mellitus type II, controlled ([place]) 03/08/2014   Depression 03/08/2014   GERD (gastroesophageal reflux disease) 03/08/2014   Hyperlipidemia 03/08/2014   Abdominal pain    Diverticulitis of large intestine without perforation or abscess without bleeding    Blood poisoning    Calculus of gallbladder with acute cholecystitis 02/27/2014   Acute cholecystitis    Diverticulitis 02/25/2014   Urinary retention 02/25/2014   Hypertension 02/25/2014   SBO (small bowel obstruction) (HCC) 02/25/2014   Sepsis ([place]) 02/25/2014   Cholecystitis 02/25/2014   Slow transit constipation 02/25/2014   Seborrheic dermatitis of scalp 11/24/2013   Diarrhea 07/12/2013   Contusion of multiple sites 05/24/2013   ED (erectile dysfunction) of organic origin 05/06/2013   Seborrheic keratosis 05/06/2013   Cough 02/17/2013   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   Acute maxillary sinusitis 10/30/2012   Allergic rhinitis 06/27/2012   Hypertrophic and atrophic condition of skin 06/27/2012   Increased frequency of urination 06/27/2012   Lower urinary tract infectious disease 06/27/2012   Major depressive disorder, single episode, unspecified 06/27/2012   Malaise and fatigue 06/27/2012   Psychosexual dysfunction with inhibited sexual excitement 06/27/2012   Personal history of colonic polyps 01/06/2012   Special screening for malignant neoplasms, colon 11/15/2011   Pruritus ani 11/15/2011    COPD (chronic obstructive pulmonary disease) ([place]) 01/11/2011   DOE (dyspnea on exertion) 01/01/2011   Fissure in ano 12/21/2010   HYPERLIPIDEMIA 01/11/2009   Obstructive sleep apnea 01/11/2009   ARTHRITIS, RHEUMATOID 01/11/2009   OSTEOARTHRITIS 01/11/2009    Immunization History  Administered Date(s) Administered   Fluad Quad(high Dose 65+) 10/16/2018, 11/03/2019   Influenza Split 11/11/2010, 10/22/2011   Influenza Whole 01/22/2008, 11/22/2009   Influenza, High Dose Seasonal PF 09/27/2015, 11/04/2016, 10/22/2017   Influenza, Seasonal, Injecte, Preservative Fre 10/09/2010   Influenza,inj,Quad PF,6+ Mos 10/19/2012, 11/03/2013, 11/10/2014   Influenza-Unspecified 11/21/2013  Moderna Sars-Covid-2 Vaccination 02/23/2019, 03/24/2019   Pneumococcal Conjugate-13 02/15/2014, 03/27/2015   Pneumococcal Polysaccharide-23 10/31/2006, 01/22/2007   Tdap 03/27/2015    Conditions to be addressed/monitored: CAD, HTN, HLD, COPD, DMII, Depression and OSA; GERD; arthritis  Care Plan : General Pharmacy (Adult)  Updates made by Lance Powell, PHARMD since 09/26/2020 12:00 AM     Problem: Medication and chronic care management   Priority: High  Onset Date: 06/26/2020     Long-Range Goal: Medication Management and assistance with medication costs   Start Date: 06/26/2020  Recent Progress: Not on track  Priority: High  Note:   Current Barriers:  Unable to independently afford treatment regimen Unable to achieve control of COPD  Unable to self administer medications as prescribed Does not maintain contact with provider office  Pharmacist Clinical Goal(s):  Over the next 90 days, patient will verbalize ability to afford treatment regimen achieve control of COPD as evidenced by improved breathing and utilization of maintenance therapy maintain control of HTN and type 2 DM as evidenced by maintaining goals below  adhere to prescribed medication regimen as evidenced by patient report and refill report   through collaboration with PharmD and provider.   Interventions: 1:1 collaboration with [name], [name], PA-C regarding development and update of comprehensive plan of care as evidenced by provider attestation and co-signature Inter-disciplinary care team collaboration (see longitudinal plan of care) Comprehensive medication review performed; medication list updated in electronic medical record  Chronic Obstructive Pulmonary Disease: Goal: reduce symptoms of COPD and improve adherence to maintenance inhaler therapy Managed by Lance Powell - Missed last appointment in January 2022.  Current regimen:  Stiolto 2 puffs once daily Albuterol inhaler as needed Albuterol nebulizer as needed Supplemental O2 - daily / 24 hours Use of maintenance inhaler - patient reports he is using Stiolto daily but has not filled in over a year Use of rescue inhaler - once every 10 days or so or about 3 times per month Interventions: Encouraged patient to use Stiolto daily Reminded patient to make a follow up appointment with Lance Powell. Offered to call office to make appointment for him but he declined. States he will call them. Patient self care activities - Over the next 90 days, patient will: Restart Stiolto - use 2 puffs daily Use albuterol inhaler or nebulizer as needed for rescue / shortness of breath Make appointment for follow up with Lance Powell  Hypertension Controlled; maintain BP goal <140/90 Current regimen:  Diet and exercise management   Interventions: Requested patient to check blood pressure 1 to 2 times per week and record Updated medication list and removed enalapril 2.5 mg (stopped by PCP 05/15/2020 - due to hypotension) Patient self care activities - Over the next 90 days, patient will: Check blood pressure 1 to 2 times per week, document, and provide at future appointments Ensure daily salt intake < 2300 mg/day  Hyperlipidemia / Elevated cholesterol/ CAD controlled LDL goal < 70 Current  regimen:  Atorvastatin 26m daily Aspirin 878mdaily  Interventions:  Maintain current cholesterol medication regimen.   Diabetes Controlled; A1c goal <7%  Lab Results  Component Value Date   HGBA1C 6.5 05/15/2020  Current regimen:  Metformin 50045mhree times daily Pioglitazone 78m31mily  Patient has mentioned at last PCP appointment that #12 pioglitazone was $70. Verified with [facility] that he last paid $3.96 for 90 day supply.  Also checked and metformin cost last was $5 per 90 days supply Patient self care activities - Over the next 90  days, patient will: Check blood sugar once or twice per week, document, and provide at future appointments Contact provider with any episodes of hypoglycemia (low blood glucose <80)   Medication management Current pharmacy: [facility] Interventions Comprehensive medication review performed. Reviewed each medication and reason for taking in depth with patient at his request.  Continue current medication management strategy Reviewed adherence / refill history:  Has not filled Stiolto in several months; See above COPD for plan to improve adherence Reminded that metformin is due to be filled.   Patient Goals/Self-Care Activities Over the next 90 days, patient will:  Take medications as prescribed check glucose daily , document, and provide at future appointments check blood pressure 1 to 2 times per week, document, and provide at future appointments collaborate with provider on medication access solutions  Follow Up Plan: Telephone follow up appointment with care management team member scheduled for:  3 months           Medication Assistance: None required.  Patient affirms current coverage meets needs.  Patient's preferred pharmacy is:  [id number] - [place], [place] - [address] AT [place] [address] [place] [address] Phone: [phone] Fax:  [phone]   Follow Up:  Patient agrees to Care Plan and Follow-up.  Plan: The care management team will reach out to the patient again over the next 90 days.  Lance Powell, PharmD Clinical Pharmacist [place] [facility] [phone]  I have personally reviewed this encounter including the documentation in this note and have collaborated with the care management provider regarding care management and care coordination activities to include development and update of the comprehensive care plan. I am certifying that I agree with the content of this note and encounter as supervising provider.  [name], PA-C

## 2020-10-11 ENCOUNTER — Ambulatory Visit: Payer: Medicare Other | Attending: Internal Medicine

## 2020-10-11 ENCOUNTER — Ambulatory Visit: Payer: Medicare Other

## 2020-10-11 ENCOUNTER — Other Ambulatory Visit (HOSPITAL_BASED_OUTPATIENT_CLINIC_OR_DEPARTMENT_OTHER): Payer: Self-pay

## 2020-10-11 DIAGNOSIS — Z23 Encounter for immunization: Secondary | ICD-10-CM

## 2020-10-11 MED ORDER — INFLUENZA VAC A&B SA ADJ QUAD 0.5 ML IM PRSY
PREFILLED_SYRINGE | INTRAMUSCULAR | 0 refills | Status: DC
  Filled 2020-10-11: qty 0.5, 1d supply, fill #0

## 2020-10-11 NOTE — Progress Notes (Signed)
   Covid-19 Vaccination Clinic  Name:  Lance Powell    MRN: [id number] DOB: October 01, 1929  10/11/2020  [name] was observed post Covid-19 immunization for 15 minutes without incident. He was provided with Vaccine Information Sheet and instruction to access the V-Safe system.   [name] was instructed to call [phone] with any severe reactions post vaccine: Difficulty breathing  Swelling of face and throat  A fast heartbeat  A bad rash all over body  Dizziness and weakness

## 2020-10-15 NOTE — Assessment & Plan Note (Signed)
Continue oxygen as you have been using it 

## 2020-10-15 NOTE — Assessment & Plan Note (Signed)
Please continue Stiolto 2 puffs once a day every day. Keep albuterol available use 2 puffs if needed for shortness of breath, chest times, wheezing. Start using your flutter valve for 10 breaths about 3 times a day Start guaifenesin 600 mg twice a day You benefit from walking and working on your exercise tolerance.  Try walking up your driveway 3 times a day Follow with Dr Lamonte Sakai in 4 months or sooner if you have any problems.

## 2020-10-15 NOTE — Assessment & Plan Note (Signed)
Continue CPAP qhs 

## 2020-10-17 ENCOUNTER — Other Ambulatory Visit (HOSPITAL_BASED_OUTPATIENT_CLINIC_OR_DEPARTMENT_OTHER): Payer: Self-pay

## 2020-10-17 IMAGING — DX DG KNEE COMPLETE 4+V*R*
4 series · 4 of 4 positions shown · non-contrast
Comparison: None

CLINICAL DATA: Knee pain, right knee pain

EXAM:
RIGHT KNEE - COMPLETE 4+ VIEW

[knee ap]
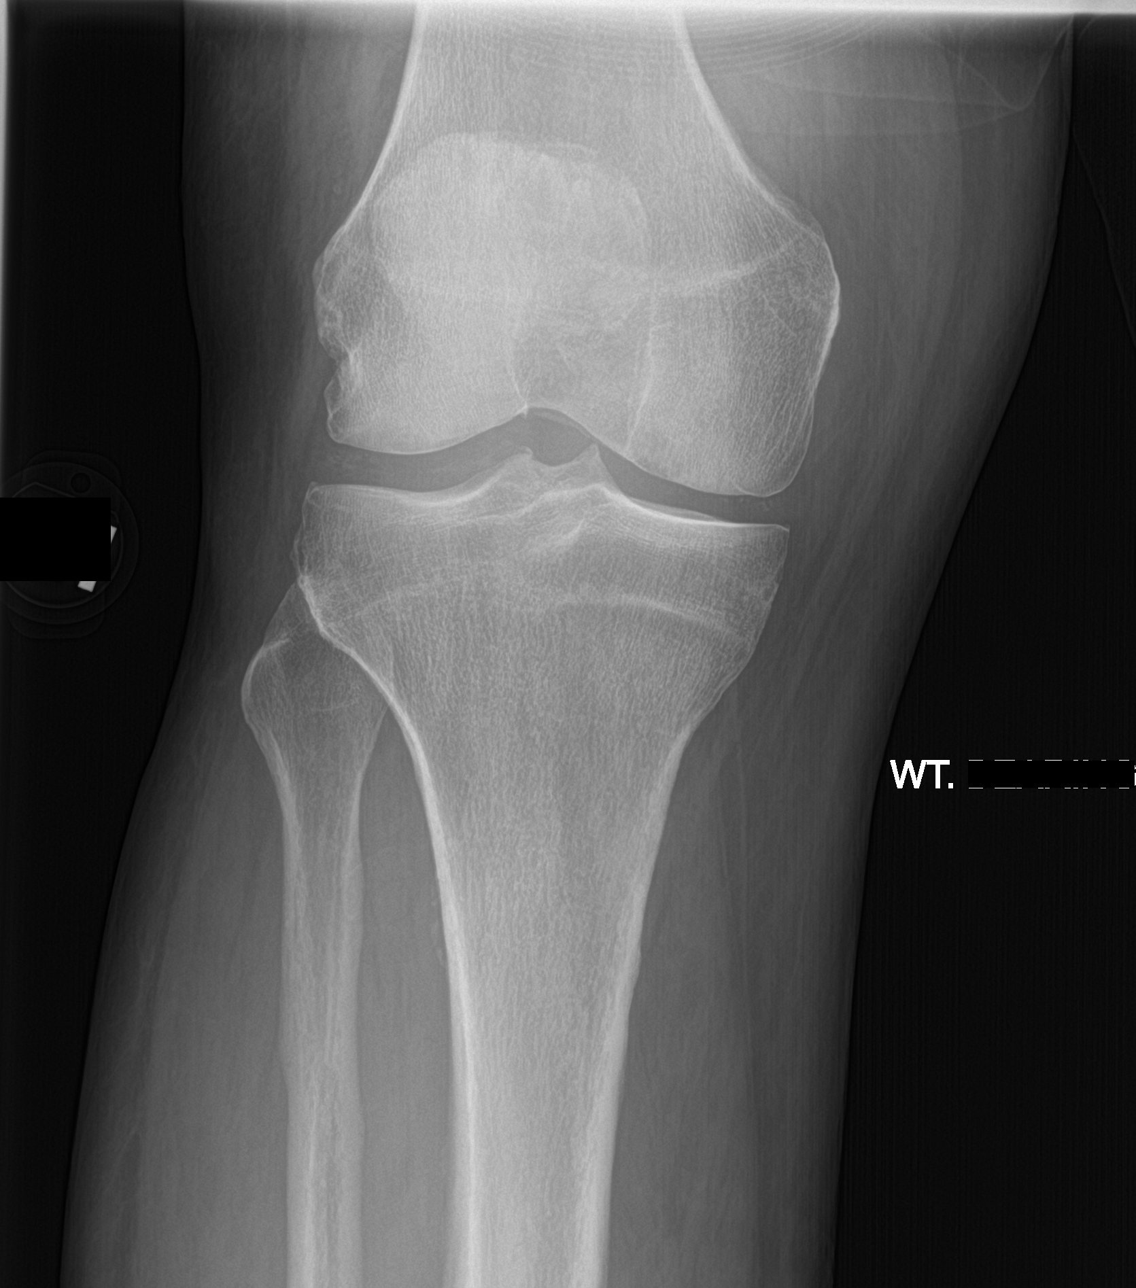

[tunnel]
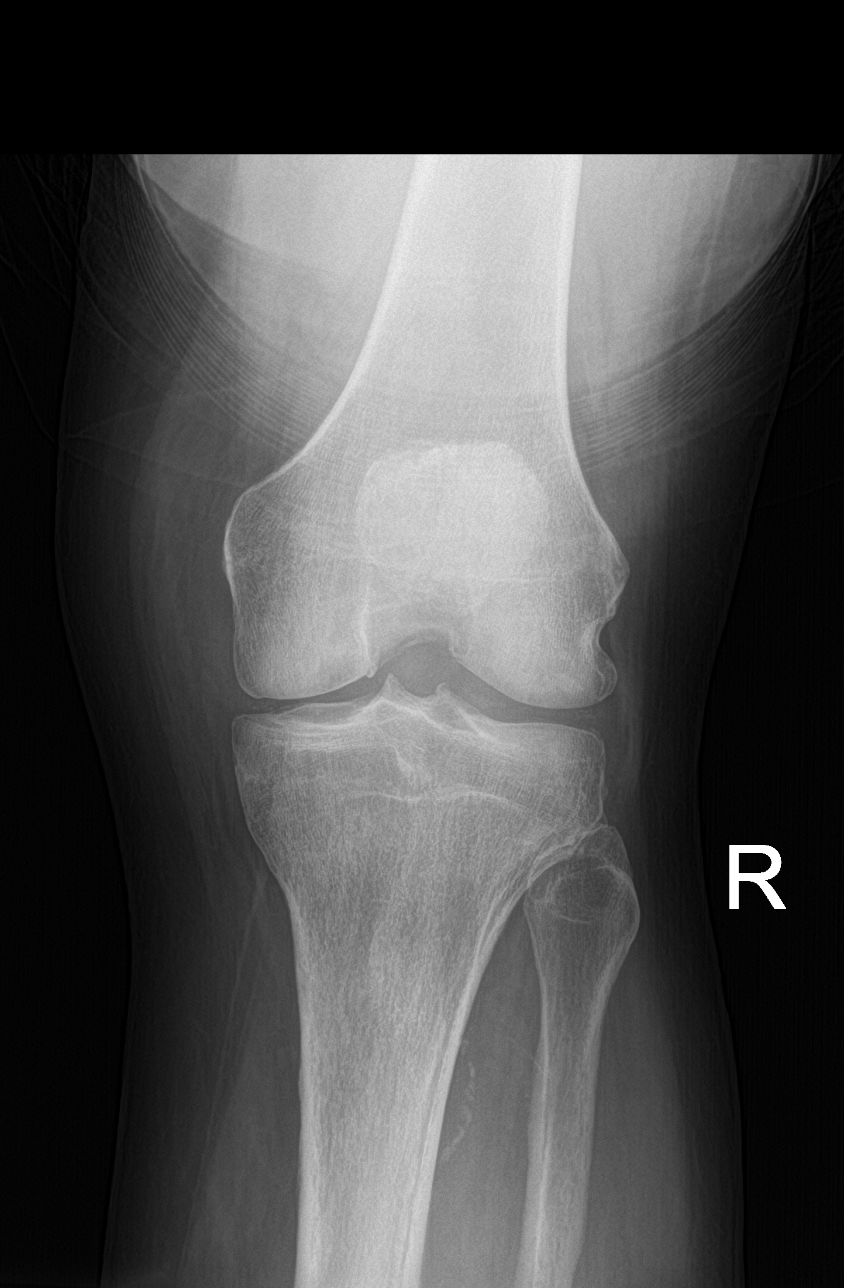

[knee lat]
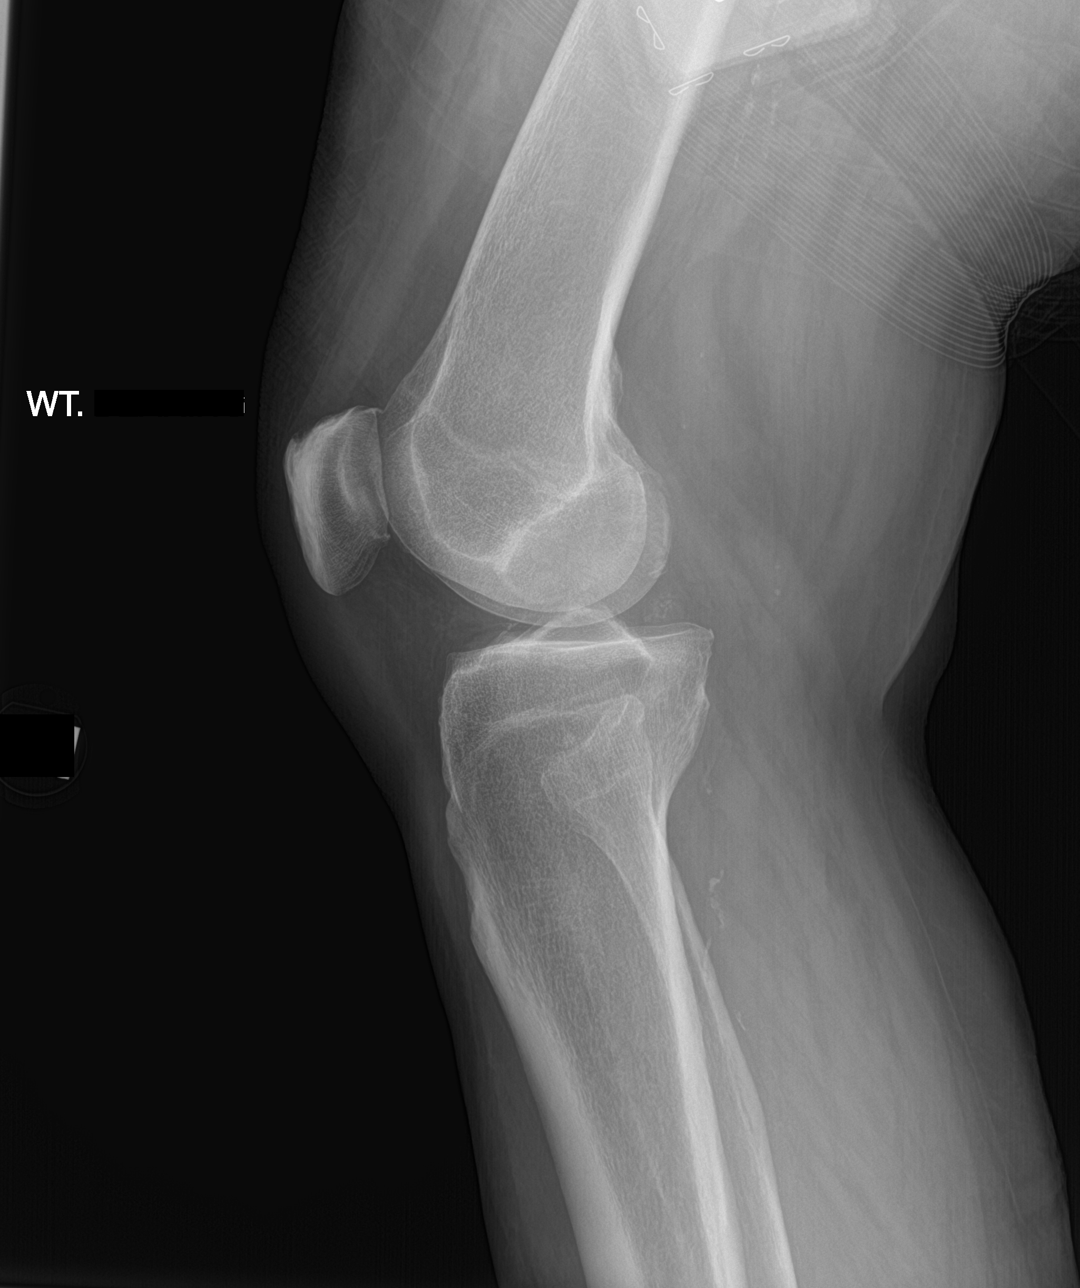

[knee sunrise]
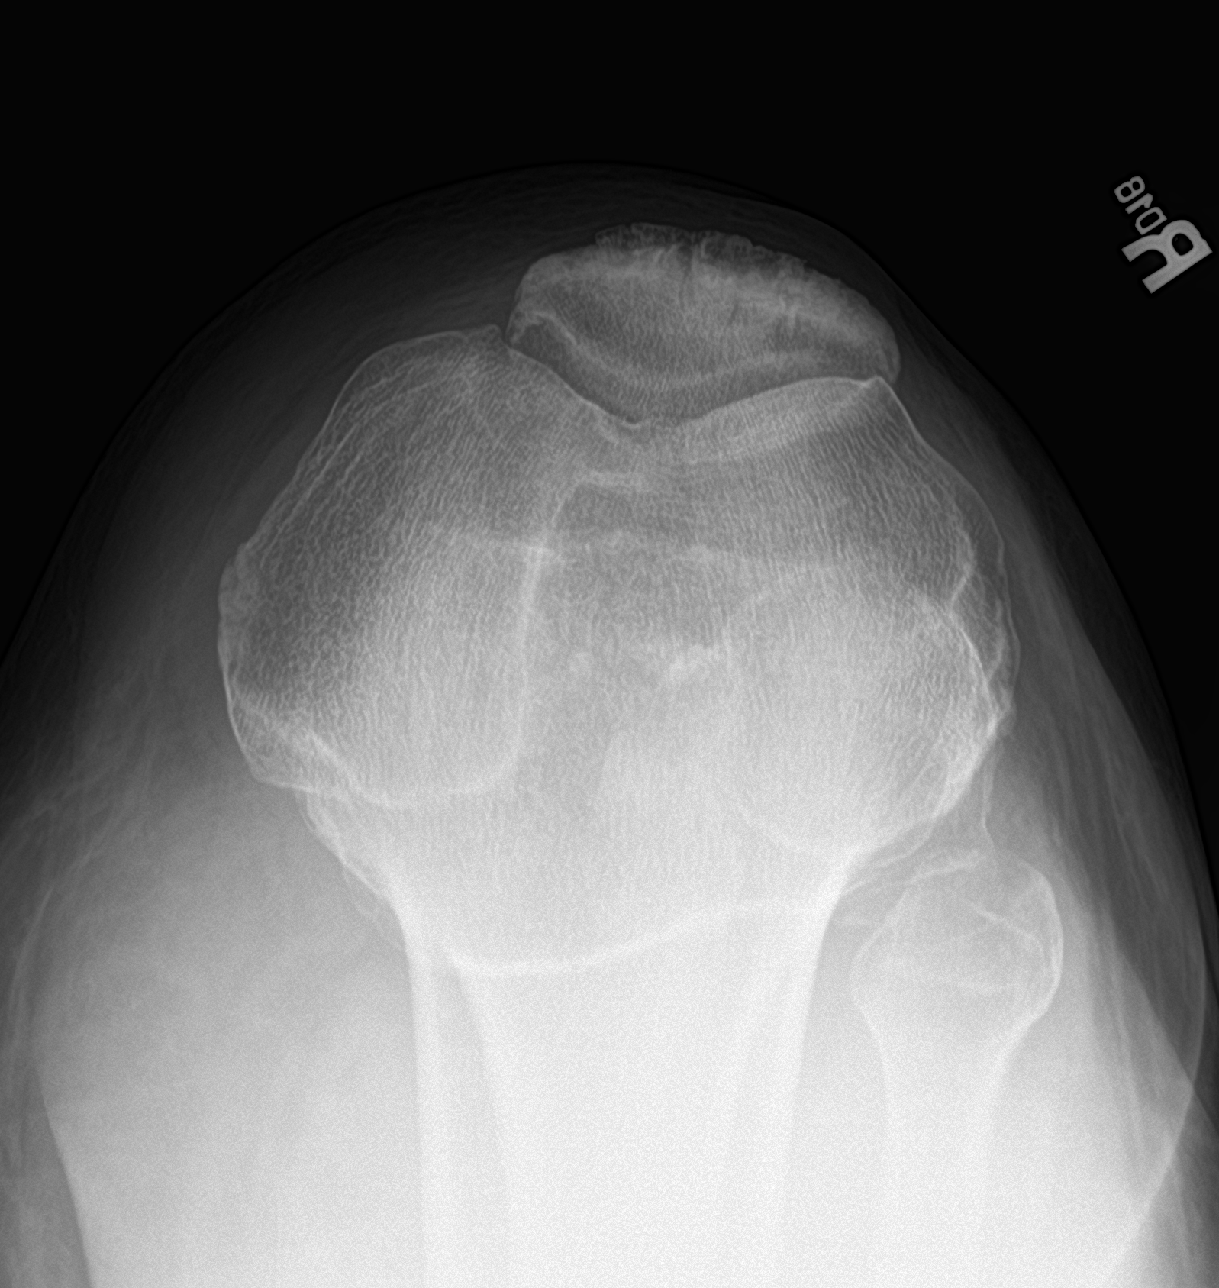

[4 of 4 positions shown; findings below may reference images not displayed]

FINDINGS: Degenerative changes, mild degenerative changes in the right knee in
medial and lateral compartments with moderate patellofemoral
osteoarthritic changes. With evidence of chondrocalcinosis in medial
and lateral compartments. Peaking of the tibial spines.
Patellofemoral osteoarthritic changes and enthesopathy. No effusion.
IMPRESSION: 1. No acute osseous abnormality.
2. Chondrocalcinosis with mild to moderate degenerative changes
worse in medial and patellofemoral compartments.

## 2020-10-17 MED ORDER — COVID-19MRNA BIVAL VACC PFIZER 30 MCG/0.3ML IM SUSP
INTRAMUSCULAR | 0 refills | Status: DC
  Filled 2020-10-17: qty 0.3, 1d supply, fill #0

## 2020-10-20 DIAGNOSIS — I10 Essential (primary) hypertension: Secondary | ICD-10-CM | POA: Diagnosis not present

## 2020-10-20 DIAGNOSIS — E785 Hyperlipidemia, unspecified: Secondary | ICD-10-CM | POA: Diagnosis not present

## 2020-10-20 DIAGNOSIS — J449 Chronic obstructive pulmonary disease, unspecified: Secondary | ICD-10-CM | POA: Diagnosis not present

## 2020-10-20 DIAGNOSIS — E118 Type 2 diabetes mellitus with unspecified complications: Secondary | ICD-10-CM

## 2020-10-20 DIAGNOSIS — Z794 Long term (current) use of insulin: Secondary | ICD-10-CM

## 2020-10-26 ENCOUNTER — Other Ambulatory Visit: Payer: Self-pay | Admitting: Medical

## 2020-11-02 ENCOUNTER — Other Ambulatory Visit: Payer: Self-pay

## 2020-11-02 ENCOUNTER — Encounter: Payer: Self-pay | Admitting: Emergency Medicine

## 2020-11-02 ENCOUNTER — Ambulatory Visit: Payer: Medicare Other | Admitting: Emergency Medicine

## 2020-11-02 DIAGNOSIS — J9611 Chronic respiratory failure with hypoxia: Secondary | ICD-10-CM

## 2020-11-02 DIAGNOSIS — J438 Other emphysema: Secondary | ICD-10-CM | POA: Diagnosis not present

## 2020-11-02 MED ORDER — BEVESPI AEROSPHERE 9-4.8 MCG/ACT IN AERO: 2.0000 | INHALATION_SPRAY | Freq: Two times a day (BID) | RESPIRATORY_TRACT | 5 refills | Status: DC

## 2020-11-02 MED ORDER — STIOLTO RESPIMAT 2.5-2.5 MCG/ACT IN AERS: 2.0000 | INHALATION_SPRAY | Freq: Every day | RESPIRATORY_TRACT | 6 refills | Status: DC

## 2020-11-02 MED ORDER — SPACER/AERO-HOLDING CHAMBERS DEVI: 1.0000 | Freq: Every day | 0 refills | Status: DC

## 2020-11-02 NOTE — Patient Instructions (Signed)
We will plan to stop Stiolto We will start Alfalfa.  You need to take 2 puffs twice a day every day on a schedule.  You can take this with a spacer. Keep your albuterol available to use 2 puffs if you need it for shortness of breath, chest tightness, wheezing.  This is your rescue inhaler. Continue your oxygen at 2 L/min.  When you are up walking with your portable oxygen device you need to increase to 4 L/min. Get the flu shot this fall Follow with APP to discuss whether you have benefited from the change to [facility]. Follow with Dr. Lamonte Sakai in 12 months or sooner if you have any problems.

## 2020-11-02 NOTE — Assessment & Plan Note (Signed)
Continue your oxygen at 2 L/min.  When you are up walking with your portable oxygen device you need to increase to 4 L/min.

## 2020-11-02 NOTE — Progress Notes (Signed)
Subjective:    Patient ID: Lance Powell, male    DOB: Jan 29, 1929, 87 y.o.   MRN: [id number]  HPI  ROV 09/13/19 --this is a follow-up visit for 87 year old gentleman with severe COPD, OSA on CPAP and oxygen, chronic hypoxemic respiratory failure.  I last saw him 3 months ago.  We have been managing him on Stiolto, seems that he may have benefited.  He had experienced a fall and had persistent left flank pain so a performed chest x-ray that did not show any fractures. His flank pain has improved. He is using Stiolto with a spacer, but often forgets it. He has albuterol nebs, not using right now. Quite sedentary. He does wear his CPAP reliably.  COVID vaccine up to date.   R OV 11/03/2019 --follow-up visit for 87 year old man with severe COPD, OSA on CPAP and oxygen, chronic hypoxemic respiratory failure on 2 L/min.  When I saw him last visit I was concerned that he was not using his Stiolto reliably.  We added mucolytic's and a flutter valve.  He reports today that he has been having a lot of diarrhea, also notes that he has been having a lot of dental problems - has been seen by several dentists but a final plan has not been made. Still some trouble remembering the Stiolto, may be a bit more active. Some cough with difficulty clearing his mucous. Not sure that he got or has used flutter, has not gotten the guaifenesin.   ROC 11/02/20 --87 year old man with severe COPD, OSA on CPAP and chronic hypoxemic respiratory failure.  He desaturated on ambulation into the room on 2 L/min pulsed flow today.  He is managed on Stiolto - but doesn't take it reliably.  Uses guaifenesin.  He has albuterol and uses about once a month.  He is wearing his CPAP, has very good compliance. He is benefiting, gets a good night's sleep. Daytime sleepiness and breathing are better.  He is quite limited, spends a lot of time resting - in chair or in bed. Clears mucous about 1-2x a day.  His daughter is here with him and notes  that his delivery of the Stiolto was probably impaired and that he does better with medications that he can use with a spacer   No flowsheet data found.  Review of Systems As per HPI      Objective:   Physical Exam Vitals:   11/02/20 1455  BP: (!) 102/58  Pulse: (!) 115  Resp: 20  Temp: 98.1 F (36.7 C)  SpO2: 94%  Weight: 236 lb 9.6 oz (107.3 kg)  Height: 6' (1.829 m)   Gen: Pleasant, overwt man, in no distress,  normal affect  ENT: No lesions,  mouth clear,  oropharynx clear, no postnasal drip  Neck: No JVD, no stridor  Lungs: No use of accessory muscles, clear on a normal breath, intermittent exp[ wheeze.   Cardiovascular: RRR, heart sounds normal, no murmur or gallops, no peripheral edema  Musculoskeletal: No deformities, some tenderness left costal margin with palpation  Neuro: alert, awake, a bit tangential, poor memory. Depends on his daughter to answer questions.   Skin: Warm, no lesions or rashes      Assessment & Plan:  COPD (chronic obstructive pulmonary disease) (HCC) We will plan to stop Stiolto We will start Bevespi.  You need to take 2 puffs twice a day every day on a schedule.  You can take this with a spacer. Keep your albuterol available to use  2 puffs if you need it for shortness of breath, chest tightness, wheezing.  This is your rescue inhaler. Get the flu shot this fall Follow with APP to discuss whether you have benefited from the change to [facility]. Follow with Dr. Lamonte Sakai in 12 months or sooner if you have any problems.   Chronic respiratory failure ([place]) Continue your oxygen at 2 L/min.  When you are up walking with your portable oxygen device you need to increase to 4 L/min.  [name], MD, PhD 11/02/2020, 3:17 PM [facility] [phone] or if no answer [phone]

## 2020-11-02 NOTE — Assessment & Plan Note (Signed)
We will plan to stop Stiolto We will start Brice.  You need to take 2 puffs twice a day every day on a schedule.  You can take this with a spacer. Keep your albuterol available to use 2 puffs if you need it for shortness of breath, chest tightness, wheezing.  This is your rescue inhaler. Get the flu shot this fall Follow with APP to discuss whether you have benefited from the change to [facility]. Follow with Dr. Lamonte Sakai in 12 months or sooner if you have any problems.

## 2020-11-02 NOTE — Addendum Note (Signed)
Addended by: Efrain Clauson P on: 11/02/2020 03:38 PM   Modules accepted: Orders  

## 2020-11-02 NOTE — Addendum Note (Signed)
Addended by: Trudi Ida on: 11/02/2020 03:31 PM   Modules accepted: Orders

## 2020-11-07 ENCOUNTER — Telehealth: Payer: Self-pay | Admitting: Medical

## 2020-11-07 NOTE — Telephone Encounter (Signed)
Pt daughter dropped off documents to be filled out by provider for dental surgery. 1 page. Pt would like documents to be faxed to [phone] when completed. Documents put in front office tray under providers name.

## 2020-11-09 ENCOUNTER — Telehealth: Payer: Self-pay | Admitting: Pharmacy Technician

## 2020-11-09 ENCOUNTER — Other Ambulatory Visit (HOSPITAL_COMMUNITY): Payer: Self-pay

## 2020-11-09 ENCOUNTER — Telehealth: Payer: Self-pay | Admitting: Medical

## 2020-11-09 NOTE — Telephone Encounter (Signed)
Patient Advocate Encounter  Received notification from [place] that prior authorization for BEVESPI is required.   PA submitted on 10.20.22 Key  [id number] Status is pending   [facility] will continue to follow  Luciano Cutter, CPhT Patient Advocate Phone: [phone] Fax:  [phone]

## 2020-11-09 NOTE — Telephone Encounter (Signed)
I have form for me to fill out/clear for oral surgery. Have not seen pt in about 3 months. For 87 year old with various medical problems need to follow up and make sure condition stable before can fill out form.  Please get him scheduled next week for appointment.

## 2020-11-10 ENCOUNTER — Telehealth: Payer: Self-pay | Admitting: Emergency Medicine

## 2020-11-10 NOTE — Telephone Encounter (Signed)
Pt already has an appt 10/26

## 2020-11-10 NOTE — Telephone Encounter (Signed)
Received notification from [place] regarding a prior authorization for [employer]. Authorization has been APPROVED from 10.20.22 to 10.20.23.

## 2020-11-10 NOTE — Telephone Encounter (Signed)
I called the patient and he reports that he had called the pharmacy and the medication was too expensive. He told me that he would go back to the previous one and he told the pharmacy to cancel the prescription,  I called [facility] and per the pharmacy they are backed up and are unable to get the medication to patients due to a computer issue. No other concerns.

## 2020-11-15 ENCOUNTER — Telehealth: Payer: Self-pay | Admitting: Medical

## 2020-11-15 ENCOUNTER — Other Ambulatory Visit: Payer: Self-pay

## 2020-11-15 ENCOUNTER — Ambulatory Visit: Payer: Medicare Other | Admitting: Medical

## 2020-11-15 VITALS — BP 141/64 | HR 100 | Resp 20 | Ht 73.0 in | Wt 236.0 lb

## 2020-11-15 DIAGNOSIS — E118 Type 2 diabetes mellitus with unspecified complications: Secondary | ICD-10-CM | POA: Diagnosis not present

## 2020-11-15 DIAGNOSIS — Z01818 Encounter for other preprocedural examination: Secondary | ICD-10-CM

## 2020-11-15 DIAGNOSIS — I10 Essential (primary) hypertension: Secondary | ICD-10-CM

## 2020-11-15 DIAGNOSIS — E785 Hyperlipidemia, unspecified: Secondary | ICD-10-CM | POA: Diagnosis not present

## 2020-11-15 DIAGNOSIS — Z794 Long term (current) use of insulin: Secondary | ICD-10-CM

## 2020-11-15 NOTE — Telephone Encounter (Signed)
Pt. Daughter called and stated she is returning call from when her father was in appt. 10/26. She was at the dentist and couldn't answer. She can be reached at : [place]

## 2020-11-15 NOTE — Patient Instructions (Addendum)
History of diabetes and last A1c was very well controlled at 6.9.  Continue pioglitazone and repeat Z9J with metabolic panel today.  Hopefully you will still be well controlled.   Recent got most recent bivalent covid booster.   History of hyperlipidemia.  Well-controlled except for slight low HDL cholesterol.  Refill atorvastatin 40 mg today.   History of hypertension.  Blood pressure well controlled today.   History of COPD.  Continue on O2 and current inhalers.  Follow-up with pulmonologist.  If no appointment scheduled please call pulmonologist office to get scheduled.   Upcoming dental procedure to extract 7 teeth. Office estimates up to 1 hour. Will need to get labs today cbc, cmp, a1c and lipid panel. With you 02 sat 87-90% without 02 and procedure lasting up to one hour will get opinion from MD I work with or your pulmonologist.   Follow up date to be determined after lab review.  Need time to evaluate labs. Prepare med list and discuss case with MD in office or pulmonlogist.  Discussed with supervising MD. Will sign form with advisement to kep 02 sat above 90% with procedure. Continue use of 02.

## 2020-11-15 NOTE — Progress Notes (Signed)
Subjective:    Patient ID: Lance Powell, male    DOB: 1929-08-02, 87 y.o.   MRN: [id number]  HPI  Pt has diabetes. He is on actos. Was expensive at one point daughter thinks cost came down and pt on med. .  No hyperglycemic  or hypo signs or symptoms.     Pt has mild elevated triglycerides. Will repeat lipid panel today. Pt on atorvastatin 40 mg daily.   Bp well controlled today.   Copd hx. Pt on 02. Sees pulmonologist. On stioloto inhaler. Pt daughter states will get him scheduled. Pt on 02. Today in office is 94% on inogen concentrator.   Rare calf muscle cramp left side.  Pt is going to have &teeth extracted and has dental clearance form. Up tp 1 hour for procedure. Will be under local with nitrous oxide.    Without 02 after 5 minute 02 sat 87-89% eventually.   Review of Systems  Constitutional:  Negative for chills, fatigue and fever.  Respiratory:  Negative for cough, chest tightness, shortness of breath and wheezing.   Cardiovascular:  Negative for chest pain and palpitations.  Gastrointestinal:  Negative for abdominal pain.  Musculoskeletal:  Negative for back pain and myalgias.  Hematological:  Negative for adenopathy. Does not bruise/bleed easily.  Psychiatric/Behavioral:  Negative for behavioral problems, confusion and dysphoric mood.    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   COPD (chronic obstructive pulmonary disease) (HCC)    Depression 03/08/2014   Diabetes mellitus    GERD (gastroesophageal reflux disease) 03/08/2014   History of blood transfusion    Hyperlipemia    Hypertension    OSA (obstructive sleep apnea)    Osteoarthritis    Rheumatoid arthritis(714.0)    Sleep apnea    cpap     Social History   Socioeconomic History   Marital status: Married    Spouse name: Not on file   Number of children: 6   Years of education: Not on file   Highest education level: Not on file  Occupational History   Occupation:  Retired    Comment: Chief Financial Officer  Tobacco Use   Smoking status: Former    Packs/day: 4.00    Years: 30.00    Pack years: 120.00    Types: Cigarettes    Quit date: 01/21/1978    Years since quitting: 42.8   Smokeless tobacco: Never  Vaping Use   Vaping Use: Never used  Substance and Sexual Activity   Alcohol use: No   Drug use: No   Sexual activity: Not on file  Other Topics Concern   Not on file  Social History Narrative   Right Handed   Lives in a one story home   Drinks no caffeine    Social Determinants of Health   Financial Resource Strain: Low Risk    Difficulty of Paying Living Expenses: Not very hard  Food Insecurity: Not on file  Transportation Needs: Not on file  Physical Activity: Inactive   Days of Exercise per Week: 0 days   Minutes of Exercise per Session: 0 min  Stress: Not on file  Social Connections: Not on file  Intimate Partner Violence: Not on file    Past Surgical History:  Procedure Laterality Date   [place] N/A 06/16/2014   Procedure: LAPAROSCOPIC CHOLECYSTECTOMY;  Surgeon: Greer Pickerel, MD;  Location: [facility];  Service: General;  Laterality: N/A;   CYSTOSCOPY  01/23/2011  Procedure: CYSTOSCOPY;  Surgeon: Molli Hazard, MD;  Location[facility];  Service: Urology;  Laterality: N/A;   FLEXIBLE SIGMOIDOSCOPY  01/07/2012   Procedure: FLEXIBLE SIGMOIDOSCOPY;  Surgeon: Ladene Artist, MD,FACG;  Location: [facility];  Service: Endoscopy;  Laterality: N/A;   HERNIA REPAIR  1990   right and left inguinal   NISSEN FUNDOPLICATION     SIGMOIDECTOMY  2004   colovesical fistula   TONSILLECTOMY  1937   TRANSURETHRAL RESECTION OF PROSTATE  01/23/2011   Procedure: TRANSURETHRAL RESECTION OF THE PROSTATE WITH GYRUS INSTRUMENTS;  Surgeon: Molli Hazard, MD;  Location: [facility];  Service: Urology;  Laterality: N/A;   VASECTOMY  1972    Family History  Problem Relation Age of Onset    Heart disease Mother    Emphysema Brother    Cancer Daughter        breast    No Known Allergies  Current Outpatient Medications on File Prior to Visit  Medication Sig Dispense Refill   albuterol (PROVENTIL) (2.5 MG/3ML) 0.083% nebulizer solution Take 3 mLs (2.5 mg total) by nebulization every 6 (six) hours as needed for wheezing or shortness of breath. 150 mL 1   albuterol (VENTOLIN HFA) 108 (90 Base) MCG/ACT inhaler Inhale 2 puffs into the lungs every 6 (six) hours as needed for wheezing or shortness of breath. 8 g 2   aspirin 81 MG tablet Take 1 tablet (81 mg total) by mouth every morning. Stop for 1 week and then resume (Patient taking differently: Take 81 mg by mouth every morning.)     atorvastatin (LIPITOR) 40 MG tablet 1 tab po q day 90 tablet 3   celecoxib (CELEBREX) 200 MG capsule TAKE 1 CAPSULE(200 MG) BY MOUTH DAILY AS NEEDED FOR MODERATE PAIN 30 capsule 1   cholecalciferol (VITAMIN D) 1000 UNITS tablet Take 2,000 Units by mouth 2 (two) times daily.     COVID-19 mRNA bivalent vaccine, Pfizer, injection Inject into the muscle. 0.3 mL 0   fluticasone (FLONASE) 50 MCG/ACT nasal spray Place 1 spray into both nostrils daily. 16 g 2   glucose blood (BAYER CONTOUR TEST) test strip Use as instructed to check blood sugar twice a day.  DX  E11.9 100 each 5   Glycopyrrolate-Formoterol (BEVESPI AEROSPHERE) 9-4.8 MCG/ACT AERO Inhale 2 puffs into the lungs 2 (two) times daily. 1 each 5   guaiFENesin (MUCINEX) 600 MG 12 hr tablet Take 1 tablet (600 mg total) by mouth 2 (two) times daily. 60 tablet 2   influenza vaccine adjuvanted (FLUAD) 0.5 ML injection Inject into the muscle. 0.5 mL 0   meclizine (ANTIVERT) 12.5 MG tablet Take 1 tablet (12.5 mg total) by mouth 3 (three) times daily as needed for dizziness. 30 tablet 0   metFORMIN (GLUCOPHAGE) 500 MG tablet TAKE 1 TABLET(500 MG) BY MOUTH THREE TIMES DAILY 90 tablet 3   misoprostol (CYTOTEC) 100 MCG tablet TAKE 1 TABLET BY MOUTH TWICE DAILY  (AM&PM) AND TAKE 2 TABLETS AT BEDTIME (Patient taking differently: TAKE 1 TABLET BY MOUTH TWICE DAILY in morning and noon  AND TAKE 2 TABLETS AT BEDTIME) 360 tablet 3   Multiple Vitamins-Minerals (CENTRUM) tablet Take 1 tablet by mouth every morning. Reported on 01/05/2015     OVER THE COUNTER MEDICATION Place 1-2 drops into both eyes daily as needed (dry red eyes.). Reported on 01/05/2015     pioglitazone (ACTOS) 15 MG tablet Take 1 tablet (15 mg total) by mouth daily. 90 tablet  3   Spacer/Aero-Holding Chambers DEVI 1 Product by Does not apply route daily. 1 Product 0   Tiotropium Bromide-Olodaterol (STIOLTO RESPIMAT) 2.5-2.5 MCG/ACT AERS Inhale 2 puffs into the lungs daily. 4 g 6   UNABLE TO FIND CPAP     venlafaxine XR (EFFEXOR-XR) 37.5 MG 24 hr capsule Take 1 capsule (37.5 mg total) by mouth daily with breakfast. 90 capsule 3   vitamin C (ASCORBIC ACID) 500 MG tablet Take 500 mg by mouth every morning.      No current facility-administered medications on file prior to visit.    BP (!) 141/64   Pulse 100   Resp 20   Ht 6\' 1"  (1.854 m)   Wt 236 lb (107 kg)   SpO2 90%   BMI 31.14 kg/m        Objective:   Physical Exam  General Mental Status- Alert. General Appearance- Not in acute distress.   Skin General: Color- Normal Color. Moisture- Normal Moisture.  Neck Carotid Arteries- Normal color. Moisture- Normal Moisture. No carotid bruits. No JVD.  Chest and Lung Exam Auscultation: Breath Sounds:-Normal.  Cardiovascular Auscultation:Rythm- Regular. Murmurs & Other Heart Sounds:Auscultation of the heart reveals- No Murmurs.  Abdomen Inspection:-Inspeection Normal. Palpation/Percussion:Note:No mass. Palpation and Percussion of the abdomen reveal- Non Tender, Non Distended + BS, no rebound or guarding.   Neurologic Cranial Nerve exam:- CN III-XII intact(No nystagmus), symmetric smile. Strength:- 5/5 equal and symmetric strength both upper and lower extremities.   Lower  ext- no pedal edema. Cals symmetric.    Assessment & Plan:   History of diabetes and last A1c was very well controlled at 6.9.  Continue pioglitazone and repeat O7H with metabolic panel today.  Hopefully you will still be well controlled.   Recent got most recent bivalent covid booster.   History of hyperlipidemia.  Well-controlled except for slight low HDL cholesterol.  Refill atorvastatin 40 mg today.   History of hypertension.  Blood pressure well controlled today.   History of COPD.  Continue on O2 and current inhalers.  Follow-up with pulmonologist.  If no appointment scheduled please call pulmonologist office to get scheduled.   Upcoming dental procedure to extract 7 teeth. Office estimates up to 1 hour. Will need to get labs today cbc, cmp, a1c and lipid panel. With you 02 sat 87-90% without 02 and procedure lasting up to one hour will get opinion from MD I work with or your pulmonologist.   Follow up date to be determined after lab review.  [name], PA-C

## 2020-11-16 LAB — LIPID PANEL
Cholesterol: 118 mg/dL (ref 0–200)
HDL: 35.7 mg/dL — ABNORMAL LOW (ref >39.00)
LDL Cholesterol: 67 mg/dL (ref 0–99)
NonHDL: 82.57
Total CHOL/HDL Ratio: 3
Triglycerides: 80 mg/dL (ref 0.0–149.0)
VLDL: 16 mg/dL (ref 0.0–40.0)

## 2020-11-16 LAB — CBC WITH DIFFERENTIAL/PLATELET
Basophils Absolute: 0.1 10*3/uL (ref 0.0–0.1)
Basophils Relative: 1.1 % (ref 0.0–3.0)
Eosinophils Absolute: 0.2 10*3/uL (ref 0.0–0.7)
Eosinophils Relative: 2.3 % (ref 0.0–5.0)
HCT: 43.7 % (ref 39.0–52.0)
Hemoglobin: 14 g/dL (ref 13.0–17.0)
Lymphocytes Relative: 25.5 % (ref 12.0–46.0)
Lymphs Abs: 2.3 10*3/uL (ref 0.7–4.0)
MCHC: 32 g/dL (ref 30.0–36.0)
MCV: 97.1 fl (ref 78.0–100.0)
Monocytes Absolute: 0.8 10*3/uL (ref 0.1–1.0)
Monocytes Relative: 8.9 % (ref 3.0–12.0)
Neutro Abs: 5.5 10*3/uL (ref 1.4–7.7)
Neutrophils Relative %: 62.2 % (ref 43.0–77.0)
Platelets: 238 10*3/uL (ref 150.0–400.0)
RBC: 4.5 Mil/uL (ref 4.22–5.81)
RDW: 14.7 % (ref 11.5–15.5)
WBC: 8.9 10*3/uL (ref 4.0–10.5)

## 2020-11-16 LAB — COMPREHENSIVE METABOLIC PANEL
ALT: 17 U/L (ref 0–53)
AST: 18 U/L (ref 0–37)
Albumin: 4 g/dL (ref 3.5–5.2)
Alkaline Phosphatase: 84 U/L (ref 39–117)
BUN: 20 mg/dL (ref 6–23)
CO2: 32 mEq/L (ref 19–32)
Calcium: 10.5 mg/dL (ref 8.4–10.5)
Chloride: 103 mEq/L (ref 96–112)
Creatinine, Ser: 1.15 mg/dL (ref 0.40–1.50)
GFR: 56.58 mL/min — ABNORMAL LOW (ref >60.00)
Glucose, Bld: 101 mg/dL — ABNORMAL HIGH (ref 70–99)
Potassium: 5.3 mEq/L — ABNORMAL HIGH (ref 3.5–5.1)
Sodium: 141 mEq/L (ref 135–145)
Total Bilirubin: 0.4 mg/dL (ref 0.2–1.2)
Total Protein: 6.5 g/dL (ref 6.0–8.3)

## 2020-11-16 LAB — HEMOGLOBIN A1C: Hgb A1c MFr Bld: 6.4 % (ref 4.6–6.5)

## 2020-11-16 NOTE — Telephone Encounter (Signed)
Called Lance Powell and lvm to return call

## 2020-11-16 NOTE — Telephone Encounter (Signed)
Did you call patient?

## 2020-11-16 NOTE — Telephone Encounter (Signed)
Daughter called back and stated the oral surgery place does have oxygen , and nurses on site and they will be monitored with his oxygen levels.

## 2020-11-16 NOTE — Telephone Encounter (Signed)
Form faxed and also notified catherine that form was about to be sent over

## 2020-11-22 ENCOUNTER — Other Ambulatory Visit (INDEPENDENT_AMBULATORY_CARE_PROVIDER_SITE_OTHER): Payer: Medicare Other

## 2020-11-22 ENCOUNTER — Telehealth: Payer: Self-pay | Admitting: *Deleted

## 2020-11-22 ENCOUNTER — Other Ambulatory Visit: Payer: Self-pay

## 2020-11-22 DIAGNOSIS — E785 Hyperlipidemia, unspecified: Secondary | ICD-10-CM

## 2020-11-22 LAB — COMPREHENSIVE METABOLIC PANEL
ALT: 22 U/L (ref 0–53)
AST: 18 U/L (ref 0–37)
Albumin: 4 g/dL (ref 3.5–5.2)
Alkaline Phosphatase: 72 U/L (ref 39–117)
BUN: 20 mg/dL (ref 6–23)
CO2: 32 mEq/L (ref 19–32)
Calcium: 10.2 mg/dL (ref 8.4–10.5)
Chloride: 103 mEq/L (ref 96–112)
Creatinine, Ser: 1.14 mg/dL (ref 0.40–1.50)
GFR: 57.17 mL/min — ABNORMAL LOW (ref >60.00)
Glucose, Bld: 89 mg/dL (ref 70–99)
Potassium: 4.6 mEq/L (ref 3.5–5.1)
Sodium: 140 mEq/L (ref 135–145)
Total Bilirubin: 0.6 mg/dL (ref 0.2–1.2)
Total Protein: 6.5 g/dL (ref 6.0–8.3)

## 2020-11-22 NOTE — Telephone Encounter (Signed)
Plz let pt know his K is normal, no worries for surgery. Ty.

## 2020-11-22 NOTE — Telephone Encounter (Signed)
Patient informed. 

## 2020-11-22 NOTE — Addendum Note (Signed)
Addended by: Jeronimo Greaves on: 11/22/2020 09:27 AM   Modules accepted: Orders

## 2020-11-22 NOTE — Telephone Encounter (Signed)
Pt came in for lab appt only to repeat cmp due to elevated potassium level on 11/15/20.  Pt's daughter asked if this would prohibit pt from going through with oral surgery tomorrow morning. PCP is not in the office today. Discussed with DOD, Nani Ravens and was advised to send test out STAT and someone would call pt before tomorrow morning. Test ordered as STAT and picked up for [facility] at 4pm. Can you have someone contact pt tonight with the outcome?

## 2020-11-22 NOTE — Addendum Note (Signed)
Addended by: Kelle Darting A on: 11/22/2020 03:24 PM   Modules accepted: Orders

## 2020-11-25 ENCOUNTER — Other Ambulatory Visit: Payer: Self-pay | Admitting: Medical

## 2020-12-08 DIAGNOSIS — J9610 Chronic respiratory failure, unspecified whether with hypoxia or hypercapnia: Secondary | ICD-10-CM | POA: Diagnosis not present

## 2020-12-08 DIAGNOSIS — G4733 Obstructive sleep apnea (adult) (pediatric): Secondary | ICD-10-CM | POA: Diagnosis not present

## 2020-12-08 DIAGNOSIS — J449 Chronic obstructive pulmonary disease, unspecified: Secondary | ICD-10-CM | POA: Diagnosis not present

## 2020-12-10 IMAGING — DX DG CHEST 2V
2 series · 2 of 2 positions shown · non-contrast
Comparison: 05/10/2019.  PA and lateral chest and CT chest

CLINICAL DATA: Dyspnea and mild lower foot edema.

EXAM:
CHEST - 2 VIEW

[chest pa]
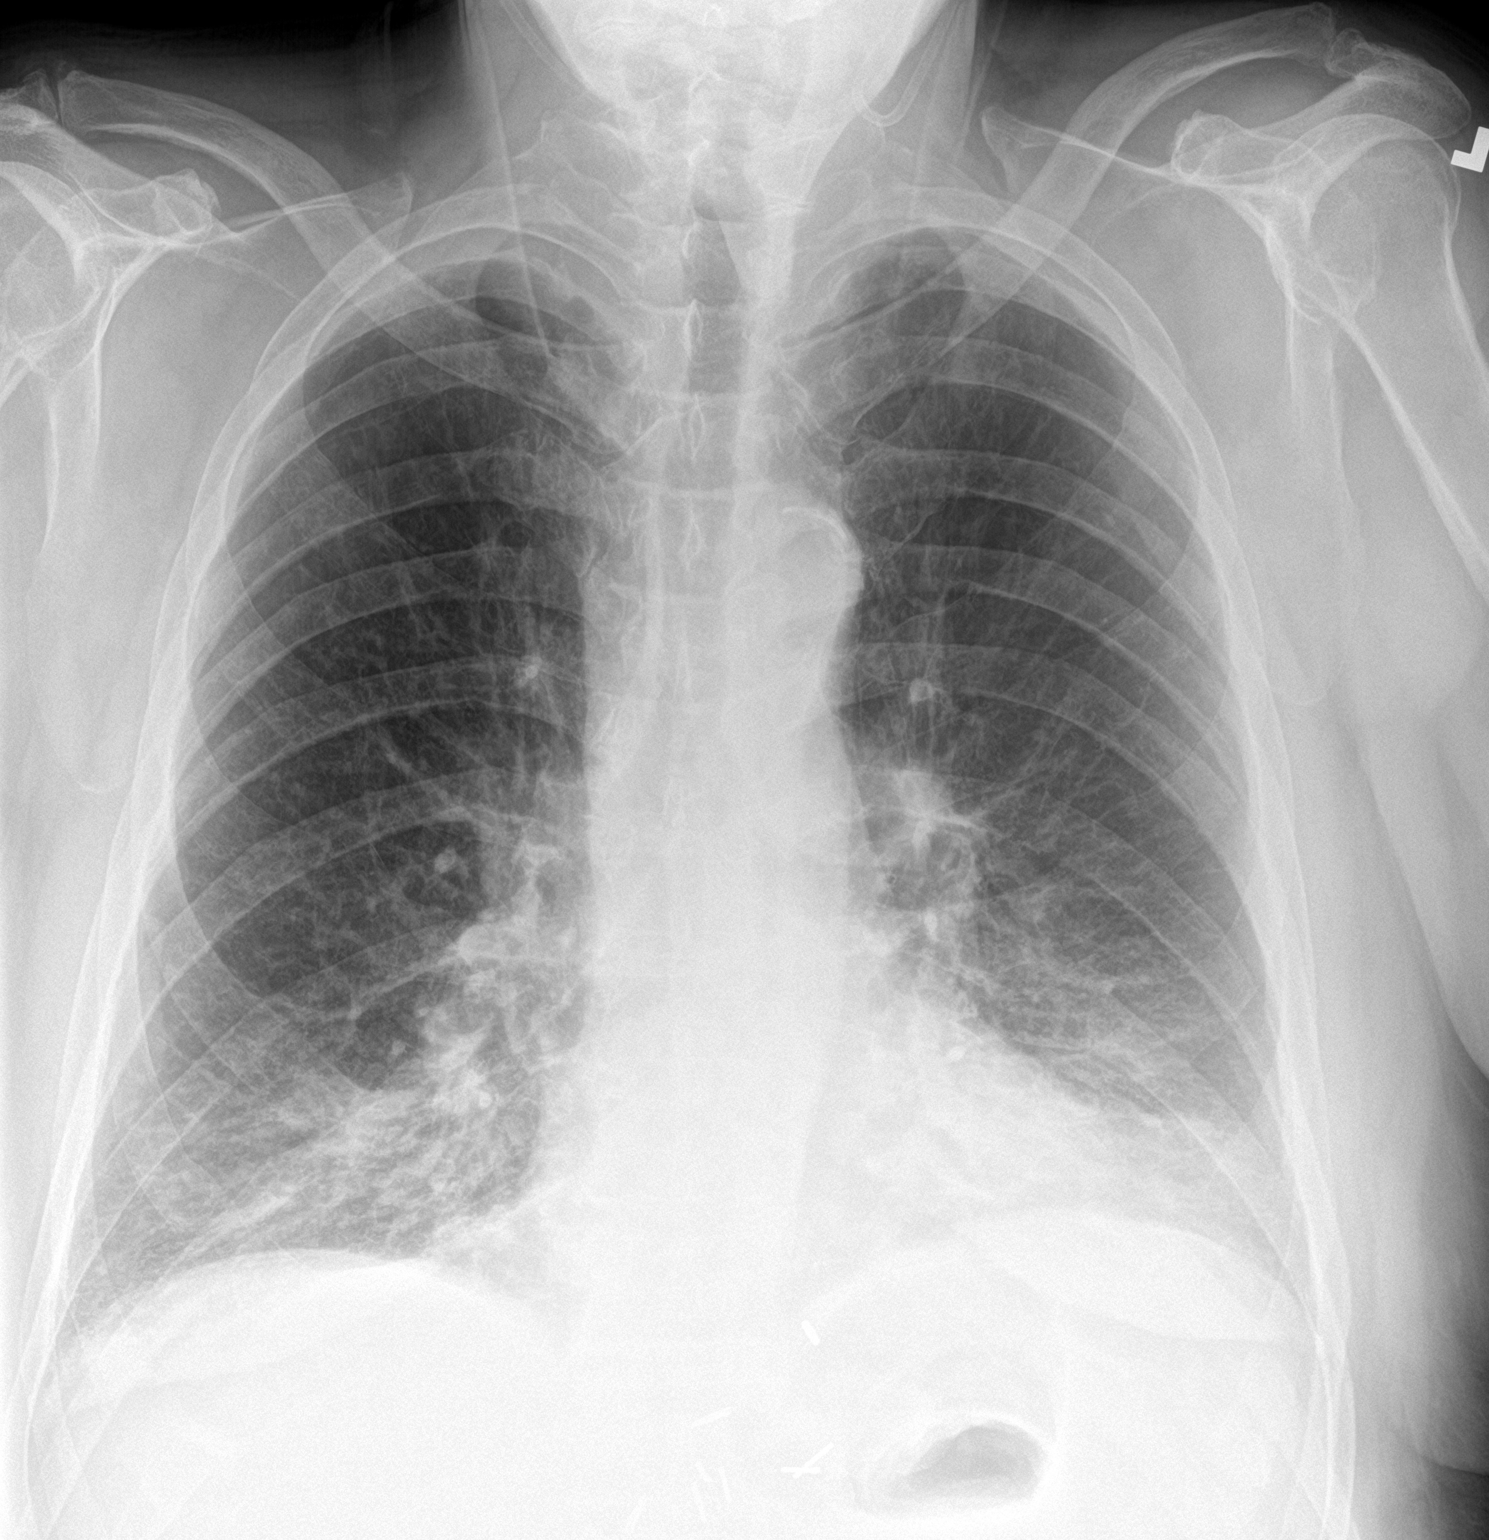

[chest lat]
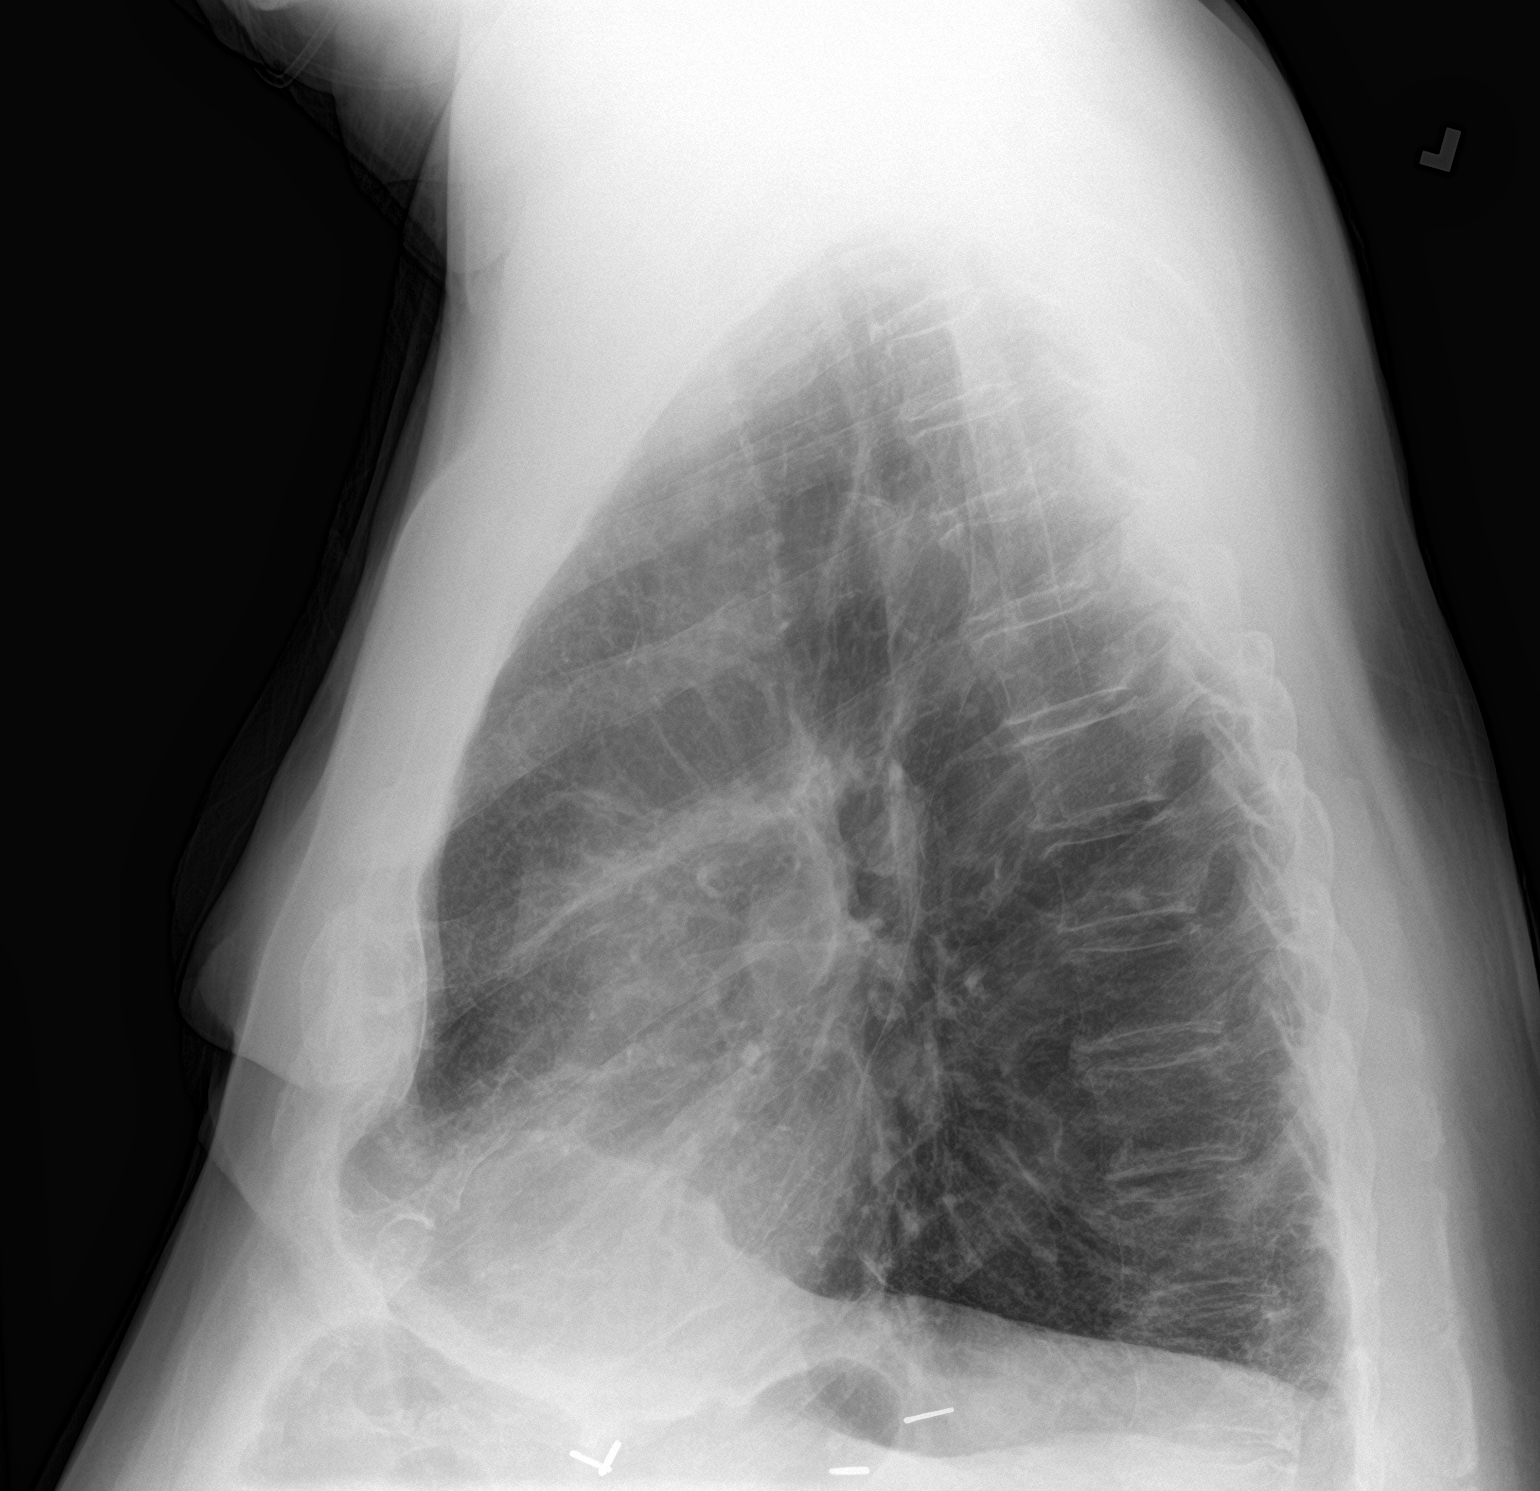

[2 of 2 positions shown; findings below may reference images not displayed]

FINDINGS: The lungs are emphysematous. Streaky airspace opacities are seen in
the lower lung zones bilaterally. No pneumothorax or pleural
effusion. Heart size is normal. Atherosclerosis. Surgical clips the
gastroesophageal junction.
IMPRESSION: New streaky bibasilar airspace opacities could be due to atelectasis
or interstitial edema in this patient with emphysema.

Atherosclerosis.

## 2020-12-26 ENCOUNTER — Ambulatory Visit: Payer: Medicare Other | Admitting: Pharmacist

## 2020-12-26 DIAGNOSIS — E785 Hyperlipidemia, unspecified: Secondary | ICD-10-CM

## 2020-12-26 DIAGNOSIS — J449 Chronic obstructive pulmonary disease, unspecified: Secondary | ICD-10-CM

## 2020-12-26 DIAGNOSIS — E118 Type 2 diabetes mellitus with unspecified complications: Secondary | ICD-10-CM

## 2020-12-26 DIAGNOSIS — I10 Essential (primary) hypertension: Secondary | ICD-10-CM

## 2020-12-31 NOTE — Chronic Care Management (AMB) (Addendum)
Chronic Care Management Pharmacy Note  12/31/2020 Name:  Lance Powell MRN:  [id number] DOB:  07-08-1929  Summary: Patient has not been using Bevespi inhaler for COPD due to cost  Recommendations/Changes made from today's visit: Discussed adherence with Bevespi. Mailed patient assistance program application for Bevespi for patient to complete.   Subjective: Lance Powell is an 86 y.o. year old male who is a primary patient of [name], [name], [place].  The CCM team was consulted for assistance with disease management and care coordination needs.    Engaged with patient by telephone for follow up visit in response to provider referral for pharmacy case management and/or care coordination services.   Consent to Services:  The patient was given information about Chronic Care Management services, agreed to services, and gave verbal consent prior to initiation of services.  Please see initial visit note for detailed documentation.   Patient Care Team: [name], Iris Pert as PCP - General (Physician Assistant) Collene Gobble, MD as Consulting Physician (Pulmonary Disease) [name], MD as Consulting Physician (Ophthalmology) Cameron Sprang, MD as Consulting Physician (Neurology) Cherre Robins, [place] (Pharmacist)  Recent office visits: 11/15/2020 - Fam Med ([place]) Pre op exam for dental surgery. No med changes. Labs checked. 08/14/2020 - PCP ([name], PAC) F/U chronic conditions. Prescribed Kayexalate for elevated potassium. 06/25/2020 - PCP ([place], [place]) - patient reported he had not been taking enalapril. Recommended to not restart to prevent hypotension  Recent consult visits: 11/09/2020 - Phone message at [facility]. PA needed for Bevespi. Approved 11/09/2020 to 11/09/2021 11/02/2020 - Pulmonology (Dr Lamonte Sakai) F/U COPD. Stopped Stiolto. Started Bevespi - 2 puff twice a day. Can use with spacer.  07/17/2020 - Ophthalmology (Dr Gershon Crane) Diabetic eye exam. No  retinopathy OU  Hospital visits: None in previous 6 months  Objective:  Lab Results  Component Value Date   CREATININE 1.14 11/22/2020   CREATININE 1.15 11/15/2020   CREATININE 1.21 09/01/2020    Lab Results  Component Value Date   HGBA1C 6.4 11/15/2020   Last diabetic Eye exam:  Lab Results  Component Value Date/Time   HMDIABEYEEXA No Retinopathy 12/21/2015 12:00 AM    Last diabetic Foot exam: No results found for: HMDIABFOOTEX      Component Value Date/Time   CHOL 118 11/15/2020 1522   TRIG 80.0 11/15/2020 1522   HDL 35.70 (L) 11/15/2020 1522   CHOLHDL 3 11/15/2020 1522   VLDL 16.0 11/15/2020 1522   LDLCALC 67 11/15/2020 1522    Hepatic Function Latest Ref Rng & Units 11/22/2020 11/15/2020 09/01/2020  Total Protein 6.0 - 8.3 g/dL 6.5 6.5 6.9  Albumin 3.5 - 5.2 g/dL 4.0 4.0 -  AST 0 - 37 U/L _0 ALT 0 - 53 U/L _1 Alk Phosphatase 39 - 117 U/L 72 84 -  Total Bilirubin 0.2 - 1.2 mg/dL 0.6 0.4 0.6    Lab Results  Component Value Date/Time   TSH 3.35 09/28/2019 11:43 AM   TSH 1.706 02/26/2014 04:45 AM    CBC Latest Ref Rng & Units 11/15/2020 05/12/2019 12/09/2018  WBC 4.0 - 10.5 K/uL 8.9 6.9 9.2  Hemoglobin 13.0 - 17.0 g/dL 14.0 14.4 15.4  Hematocrit 39.0 - 52.0 % 43.7 44.6 46.6  Platelets 150.0 - 400.0 K/uL 238.0 212.0 194.0    No results found for: VD25OH  Clinical ASCVD: Yes  The ASCVD Risk score (Arnett DK, et al., 2019) failed to calculate for the following reasons:   The  2019 ASCVD risk score is only valid for ages 74 to 2     Social History   Tobacco Use  Smoking Status Former   Packs/day: 4.00   Years: 30.00   Pack years: 120.00   Types: Cigarettes   Quit date: 01/21/1978   Years since quitting: 42.9  Smokeless Tobacco Never   BP Readings from Last 3 Encounters:  11/15/20 (!) 141/64  11/02/20 (!) 102/58  08/14/20 128/75   Pulse Readings from Last 3 Encounters:  11/15/20 100  11/02/20 (!) 115  08/14/20 100   Wt Readings  from Last 3 Encounters:  11/15/20 236 lb (107 kg)  11/02/20 236 lb 9.6 oz (107.3 kg)  08/14/20 237 lb (107.5 kg)    Assessment: Review of patient past medical history, allergies, medications, health status, including review of consultants reports, laboratory and other test data, was performed as part of comprehensive evaluation and provision of chronic care management services.   SDOH:  (Social Determinants of Health) assessments and interventions performed:  SDOH Interventions    Flowsheet Row Most Recent Value  SDOH Interventions   Financial Strain Interventions Other (Comment)  [starting process for applying for PAP for [place]  No Known Allergies  Medications Reviewed Today     Reviewed by Cherre Robins, RPH-CPP (Pharmacist) on 12/31/20 at 2115  Med List Status: <None>   Medication Order Taking? Sig Documenting Provider Last Dose Status Informant  albuterol (PROVENTIL) (2.5 MG/3ML) 0.083% nebulizer solution [id number] Yes Take 3 mLs (2.5 mg total) by nebulization every 6 (six) hours as needed for wheezing or shortness of breath. [name], [name], PA-C Taking Active   albuterol (VENTOLIN HFA) 108 (90 Base) MCG/ACT inhaler [id number] Yes Inhale 2 puffs into the lungs every 6 (six) hours as needed for wheezing or shortness of breath. Collene Gobble, MD Taking Active   aspirin 81 MG tablet [id number] Yes Take 1 tablet (81 mg total) by mouth every morning. Stop for 1 week and then resume  Patient taking differently: Take 81 mg by mouth every morning.   Barton Dubois, MD Taking Active   atorvastatin (LIPITOR) 40 MG tablet [id number] Yes 1 tab po q day [name], [place] Taking Active   celecoxib (CELEBREX) 200 MG capsule [id number] Yes TAKE 1 CAPSULE(200 MG) BY MOUTH DAILY AS NEEDED FOR MODERATE PAIN [name], [name], PA-C Taking Active   cholecalciferol (VITAMIN D) 1000 UNITS tablet [id number] Yes Take 2,000 Units by mouth 2 (two) times daily. [provider] Taking Active Self  fluticasone (FLONASE) 50 MCG/ACT nasal spray [id number] Yes Place 1 spray into both nostrils daily. Collene Gobble, MD Taking Active   glucose blood (BAYER CONTOUR TEST) test strip [id number] Yes Use as instructed to check blood sugar twice a day.  DX  E11.9 [name], [name], PA-C Taking Active   Glycopyrrolate-Formoterol (BEVESPI AEROSPHERE) 9-4.8 MCG/ACT Hollie Salk [id number] Yes Inhale 2 puffs into the lungs 2 (two) times daily. Collene Gobble, MD Taking Active   guaiFENesin (MUCINEX) 600 MG 12 hr tablet [id number] Yes Take 1 tablet (600 mg total) by mouth 2 (two) times daily. Collene Gobble, MD Taking Active   meclizine (ANTIVERT) 12.5 MG tablet [id number] Yes Take 1 tablet (12.5 mg total) by mouth 3 (three) times daily as needed for dizziness. [name], [name], PA-C Taking Active   metFORMIN (GLUCOPHAGE) 500 MG tablet [id number] Yes TAKE 1 TABLET(500 MG) BY MOUTH THREE TIMES DAILY [name], [name], PA-C Taking Active  misoprostol (CYTOTEC) 100 MCG tablet [id number] Yes TAKE 1 TABLET BY MOUTH TWICE DAILY (AM&PM) AND TAKE 2 TABLETS AT BEDTIME  Patient taking differently: TAKE 1 TABLET BY MOUTH TWICE DAILY in morning and noon  AND TAKE 2 TABLETS AT BEDTIME   [name], [name], PA-C Taking Active   Multiple Vitamins-Minerals (CENTRUM) tablet [id number] Yes Take 1 tablet by mouth every morning. Reported on 01/05/2015 [provider] Taking Active Self           Med Note Barbaraann Boys Jun 26, 2020  3:34 PM)    OVER THE COUNTER MEDICATION [id number] Yes Place 1-2 drops into both eyes daily as needed (dry red eyes.). Reported on 01/05/2015 [provider] Taking Active Self  pioglitazone (ACTOS) 15 MG tablet [id number] Yes Take 1 tablet (15 mg total) by mouth daily. [name], PA-C Taking Active   Spacer/Aero-Holding Dorise Bullion [id number] Yes 1 Product by Does not apply route daily. Collene Gobble, MD Taking Active   UNABLE TO FIND [id number] Yes CPAP [provider] Taking Active Self  venlafaxine XR (EFFEXOR-XR) 37.5 MG 24 hr capsule [id number] Yes Take 1 capsule (37.5 mg total) by mouth daily with breakfast. [name], [name], PA-C Taking Active   vitamin C (ASCORBIC ACID) 500 MG tablet [id number] Yes Take 500 mg by mouth every morning.  [provider] Taking Active Self            Patient Active Problem List   Diagnosis Date Noted   Accident due to mechanical fall without injury 06/11/2019   Sprain of medial collateral ligament of right knee 04/08/2019   Prepatellar bursitis of right knee 04/08/2019   Chronic respiratory failure ([place]) 11/06/2017   OA (osteoarthritis) of hip 07/27/2014   Abrasion 07/27/2014   Insomnia 06/27/2014   Chronic cholecystitis 06/16/2014   Coccyx pain 03/30/2014   Diabetes mellitus type II, controlled ([place]) 03/08/2014   Depression 03/08/2014   GERD (gastroesophageal reflux disease) 03/08/2014   Hyperlipidemia 03/08/2014   Abdominal pain    Diverticulitis of large intestine without perforation or abscess without bleeding    Blood poisoning    Calculus of gallbladder with acute cholecystitis 02/27/2014   Acute cholecystitis    Diverticulitis 02/25/2014   Urinary retention 02/25/2014   Hypertension 02/25/2014   SBO (small bowel obstruction) ([place]) 02/25/2014   Sepsis ([place]) 02/25/2014   Cholecystitis 02/25/2014   Slow transit constipation 02/25/2014   Seborrheic dermatitis of scalp 11/24/2013   Diarrhea 07/12/2013   Contusion of multiple sites 05/24/2013   ED (erectile dysfunction) of organic origin 05/06/2013   Seborrheic keratosis 05/06/2013   Cough 02/17/2013   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   Acute maxillary sinusitis 10/30/2012   Allergic rhinitis 06/27/2012   Hypertrophic and atrophic condition of skin 06/27/2012   Increased frequency of urination 06/27/2012   Lower urinary tract infectious disease 06/27/2012   Major depressive disorder, single episode, unspecified 06/27/2012    Malaise and fatigue 06/27/2012   Psychosexual dysfunction with inhibited sexual excitement 06/27/2012   Personal history of colonic polyps 01/06/2012   Special screening for malignant neoplasms, colon 11/15/2011   Pruritus ani 11/15/2011   COPD (chronic obstructive pulmonary disease) ([place]) 01/11/2011   DOE (dyspnea on exertion) 01/01/2011   Fissure in ano 12/21/2010   HYPERLIPIDEMIA 01/11/2009   Obstructive sleep apnea 01/11/2009   ARTHRITIS, RHEUMATOID 01/11/2009   OSTEOARTHRITIS 01/11/2009    Immunization History  Administered Date(s) Administered   Fluad Quad(high Dose 65+) 10/16/2018, 11/03/2019, 10/11/2020  Influenza Split 11/11/2010, 10/22/2011   Influenza Whole 01/22/2008, 11/22/2009   Influenza, High Dose Seasonal PF 09/27/2015, 11/04/2016, 10/22/2017   Influenza, Seasonal, Injecte, Preservative Fre 10/09/2010   Influenza,inj,Quad PF,6+ Mos 10/19/2012, 11/03/2013, 11/10/2014   Influenza-Unspecified 11/21/2013   Moderna Sars-Covid-2 Vaccination 02/23/2019, 03/24/2019   Pfizer Covid-19 Vaccine Bivalent Booster 14yr & up 10/11/2020   Pneumococcal Conjugate-13 02/15/2014, 03/27/2015   Pneumococcal Polysaccharide-23 10/31/2006, 01/22/2007   Tdap 03/27/2015    Conditions to be addressed/monitored: CAD, HTN, HLD, COPD, DMII, Depression and OSA; GERD; arthritis  Care Plan : General Pharmacy (Adult)  Updates made by ECherre Robins RPH-CPP since 12/31/2020 12:00 AM     Problem: Medication and chronic care management   Priority: High  Onset Date: 06/26/2020     Long-Range Goal: Medication Management and assistance with medication costs   Start Date: 06/26/2020  Recent Progress: Not on track  Priority: High  Note:   Current Barriers:  Unable to independently afford treatment regimen Unable to achieve control of COPD  Unable to self administer medications as prescribed Does not maintain contact with provider office  Pharmacist Clinical Goal(s):  Over the next 90 days,  patient will verbalize ability to afford treatment regimen achieve control of COPD as evidenced by improved breathing and utilization of maintenance therapy maintain control of HTN and type 2 DM as evidenced by maintaining goals below  adhere to prescribed medication regimen as evidenced by patient report and refill report  through collaboration with PharmD and provider.   Interventions: 1:1 collaboration with [name], EPercell Miller PA-C regarding development and update of comprehensive plan of care as evidenced by provider attestation and co-signature Inter-disciplinary care team collaboration (see longitudinal plan of care) Comprehensive medication review performed; medication list updated in electronic medical record  Chronic Obstructive Pulmonary Disease: Goal: reduce symptoms of COPD and improve adherence to maintenance inhaler therapy Managed by Dr BLamonte Sakai Current regimen:  Bevespi Inhaler - inhale 2 puffs twice a day Albuterol inhaler as needed Albuterol nebulizer as needed Supplemental O2 - daily / 24 hours Use of maintenance inhaler - no using Bevespi consistently due to cost Use of rescue inhaler - once every 10 days or so or about 3 times per month Interventions: Encouraged patient to use Bevespi. Starting process to apply for patient assistance.  Application mailed to patient  Patient self care activities - Over the next 90 days, patient will: Take Bevespi 2 puffs twice a day Use albuterol inhaler or nebulizer as needed for rescue / shortness of breath Complete application for Bevespi patient assistance program and return to office.   Hypertension Controlled; maintain BP goal <140/90 Current regimen:  Diet and exercise management   Interventions: Requested patient to check blood pressure 1 to 2 times per week and record Updated medication list and removed enalapril 2.5 mg (stopped by PCP 05/15/2020 - due to hypotension) Patient self care activities - Over the next 90 days,  patient will: Check blood pressure 1 to 2 times per week, document, and provide at future appointments Ensure daily salt intake < 2300 mg/day  Hyperlipidemia / Elevated cholesterol/ CAD controlled LDL goal < 70 Current regimen:  Atorvastatin 43mdaily Aspirin 8180maily  Interventions:  Maintain current cholesterol medication regimen. Reviewed last labs from 11/15/2020. Patient asked for labs to be mailed to him. Completed.    Diabetes Controlled; A1c goal <7%  Lab Results  Component Value Date   HGBA1C 6.5 05/15/2020  Current regimen:  Metformin 500m57mree times daily Pioglitazone 15mg8mly  Patient self care activities - Over the next 90 days, patient will: Check blood sugar once or twice per week, document, and provide at future appointments Contact provider with any episodes of hypoglycemia (low blood glucose <80)  Medication management Current pharmacy: [facility] Comprehensive medication review performed. Reviewed each medication and reason for taking in depth with patient at his request.  Continue current medication management strategy Reviewed adherence / refill history:  Has not filled Stiolto in several months; See above COPD for plan to improve adherence Reminded that metformin is due to be filled.   Patient Goals/Self-Care Activities Over the next 90 days, patient will:  Take medications as prescribed check glucose daily , document, and provide at future appointments check blood pressure 1 to 2 times per week, document, and provide at future appointments collaborate with provider on medication access solutions  Follow Up Plan: Telephone follow up appointment with care management team member scheduled for:  1 month to check on patient assistance program application for Bevespi.           Medication Assistance: None required.  Patient affirms current coverage meets needs.  Patient's preferred pharmacy is:  [id number] - [place], [place] - [address] AT [place] [address] [place] [address] Phone: [phone] Fax: [phone]   Follow Up:  Patient agrees to Care Plan and Follow-up.  Plan: The care management team will reach out to the patient again over the next 30 days.  Cherre Robins, PharmD Clinical Pharmacist [facility] [phone]   I have personally reviewed this encounter including the documentation in this note and have collaborated with the care management provider regarding care management and care coordination activities to include development and update of the comprehensive care plan. I am certifying that I agree with the content of this note and encounter as supervising provider   [name], PA-C

## 2020-12-31 NOTE — Patient Instructions (Signed)
[name] It was a pleasure speaking with you today.  I have attached a summary of our visit today and information about your health goals.   Our next appointment is by telephone on January 12, 2022 at 12:30pm  Please call the care guide team at [phone] if you need to cancel or reschedule your appointment.   If you have any questions or concerns, please feel free to contact me either at the phone number below or with a MyChart message.   Keep up the good work!  Cherre Robins, PharmD Clinical Pharmacist [facility] [phone] (direct line)  [phone] (main office number)   Patient verbalizes understanding of instructions provided today and agrees to view in [place].    Visit Information  PATIENT GOALS:  Goals Addressed             This Visit's Progress    Chronic Care Management Pharmacy Care Plan       CARE PLAN ENTRY  Current Barriers:  Chronic Disease Management support, education, and care coordination needs related to COPD, type 2 Diabetes, Hypertension, Elevated cholesterol, depression, Osteoarthritis   Hypertension Pharmacist Clinical Goal(s): Over the next 90 days, patient will work with PharmD and providers to maintain BP goal <140/90 BP Readings from Last 3 Encounters:  11/15/20 (!) 141/64  11/02/20 (!) 102/58  08/14/20 128/75  Current regimen:  Diet and exercise management   Interventions: Requested patient to check blood pressure 1 to 2 times per week and record Updated medication list and removed enalapril 2.5 mg  Patient self care activities - Over the next 90 days, patient will: Check blood pressure 1 to 2 times per week, document, and provide at future appointments Ensure daily salt intake < 2300 mg/day  Hyperlipidemia / Elevated cholesterol Pharmacist Clinical Goal(s): Over the next 180 days, patient will work with PharmD and providers to maintain LDL goal < 70  Lipid Panel     Component Value  Date/Time   CHOL 118 11/15/2020 1522   TRIG 80.0 11/15/2020 1522   HDL 35.70 (L) 11/15/2020 1522   CHOLHDL 3 11/15/2020 1522   VLDL 16.0 11/15/2020 1522   LDLCALC 67 11/15/2020 1522  Current regimen:  Atorvastatin 40mg  daily Patient self care activities - Over the next 180 days, patient will: Maintain current cholesterol medication regimen.   Diabetes Pharmacist Clinical Goal(s): Over the next 90 days, patient will work with PharmD and providers to maintain A1c goal <7%  Lab Results  Component Value Date   HGBA1C 6.4 11/15/2020  Current regimen:  Metformin 500mg  three times daily Pioglitazone 15mg  daily  Patient self care activities - Over the next 90 days, patient will: Check blood sugar once daily, document, and provide at future appointments Contact provider with any episodes of hypoglycemia (low blood glucose <80)  Chronic Obstructive Pulmonary Disease: Goal: reduce symptoms of COPD and improve adherence to maintenance inhaler therapy Managed by Dr Lamonte Sakai  Current regimen:  Bevespi Inhaler - inhale 2 puffs twice a day  Albuterol inhaler as needed Albuterol nebulizer as needed Supplemental O2 - daily / 24 hours Interventions: Encouraged patient to use Bevespi. Starting process to apply for patient assistance.  Application mailed to patient  Patient self care activities - Over the next 90 days, patient will: Take Bevespi 2 puffs twice a day Use albuterol inhaler or nebulizer as needed for rescue / shortness of breath Complete application for Bevespi patient assistance program and return to office.   Medication management Pharmacist  Clinical Goal(s): Over the next 90 days, patient will work with PharmD and providers to achieve optimal medication adherence Current pharmacy: [facility] Comprehensive medication review performed. Continue current medication management strategy Patient self care activities - Over the next 90 days, patient  will: Focus on medication adherence by filling medications appropriately  Take medications as prescribed Report any questions or concerns to PharmD and/or provider(s)  Please see past updates related to this goal by clicking on the "Past Updates" button in the selected goal          Patient verbalizes understanding of instructions provided today and agrees to view in [place].   Telephone follow up appointment with care management team member scheduled for: 68 days  Cherre Robins, PharmD Clinical Pharmacist [facility]

## 2021-01-07 DIAGNOSIS — J9610 Chronic respiratory failure, unspecified whether with hypoxia or hypercapnia: Secondary | ICD-10-CM | POA: Diagnosis not present

## 2021-01-07 DIAGNOSIS — G4733 Obstructive sleep apnea (adult) (pediatric): Secondary | ICD-10-CM | POA: Diagnosis not present

## 2021-01-07 DIAGNOSIS — J449 Chronic obstructive pulmonary disease, unspecified: Secondary | ICD-10-CM | POA: Diagnosis not present

## 2021-01-12 ENCOUNTER — Ambulatory Visit (INDEPENDENT_AMBULATORY_CARE_PROVIDER_SITE_OTHER): Payer: Medicare Other | Admitting: Pharmacist

## 2021-01-12 DIAGNOSIS — J449 Chronic obstructive pulmonary disease, unspecified: Secondary | ICD-10-CM

## 2021-01-12 NOTE — Chronic Care Management (AMB) (Addendum)
Chronic Care Management Pharmacy Note  01/12/2021 Name:  Lance Powell MRN:  [id number] DOB:  Feb 06, 1929  Summary: Patient has not been using Bevespi inhaler for COPD due to cost. Mailed application for patient assistance program for Bevespi on 12/26/2020  but patient reports today he has not received yet.   Recommendations/Changes made from today's visit: Discussed adherence with Bevespi. Mailed patient assistance program application for Bevespi for patient again.   Subjective: Lance Powell is an 87 y.o. year old male who is a primary patient of [name], [name], [place].  The CCM team was consulted for assistance with disease management and care coordination needs.    Engaged with patient by telephone for follow up visit in response to provider referral for pharmacy case management and/or care coordination services.   Consent to Services:  The patient was given information about Chronic Care Management services, agreed to services, and gave verbal consent prior to initiation of services.  Please see initial visit note for detailed documentation.   Patient Care Team: [name], Iris Pert as PCP - General (Physician Assistant) Collene Gobble, MD as Consulting Physician (Pulmonary Disease) [name], MD as Consulting Physician (Ophthalmology) Cameron Sprang, MD as Consulting Physician (Neurology) Cherre Robins, [place] (Pharmacist)  Recent office visits: 11/15/2020 - Fam Med ([place], [facility]) Pre op exam for dental surgery. No med changes. Labs checked. 08/14/2020 - PCP ([name], PAC) F/U chronic conditions. Prescribed Kayexalate for elevated potassium. 06/25/2020 - PCP ([place], [place]) - patient reported he had not been taking enalapril. Recommended to not restart to prevent hypotension  Recent consult visits: 11/09/2020 - Phone message at [facility]. PA needed for Bevespi. Approved 11/09/2020 to 11/09/2021 11/02/2020 - Pulmonology (Dr Lamonte Sakai) F/U COPD. Stopped Stiolto. Started  Bevespi - 2 puff twice a day. Can use with spacer.  07/17/2020 - Ophthalmology (Dr Gershon Crane) Diabetic eye exam. No retinopathy OU  Hospital visits: None in previous 6 months  Objective:  Lab Results  Component Value Date   CREATININE 1.14 11/22/2020   CREATININE 1.15 11/15/2020   CREATININE 1.21 09/01/2020    Lab Results  Component Value Date   HGBA1C 6.4 11/15/2020   Last diabetic Eye exam:  Lab Results  Component Value Date/Time   HMDIABEYEEXA No Retinopathy 12/21/2015 12:00 AM    Last diabetic Foot exam: No results found for: HMDIABFOOTEX      Component Value Date/Time   CHOL 118 11/15/2020 1522   TRIG 80.0 11/15/2020 1522   HDL 35.70 (L) 11/15/2020 1522   CHOLHDL 3 11/15/2020 1522   VLDL 16.0 11/15/2020 1522   LDLCALC 67 11/15/2020 1522    Hepatic Function Latest Ref Rng & Units 11/22/2020 11/15/2020 09/01/2020  Total Protein 6.0 - 8.3 g/dL 6.5 6.5 6.9  Albumin 3.5 - 5.2 g/dL 4.0 4.0 -  AST 0 - 37 U/L _0 ALT 0 - 53 U/L _1 Alk Phosphatase 39 - 117 U/L 72 84 -  Total Bilirubin 0.2 - 1.2 mg/dL 0.6 0.4 0.6    Lab Results  Component Value Date/Time   TSH 3.35 09/28/2019 11:43 AM   TSH 1.706 02/26/2014 04:45 AM    CBC Latest Ref Rng & Units 11/15/2020 05/12/2019 12/09/2018  WBC 4.0 - 10.5 K/uL 8.9 6.9 9.2  Hemoglobin 13.0 - 17.0 g/dL 14.0 14.4 15.4  Hematocrit 39.0 - 52.0 % 43.7 44.6 46.6  Platelets 150.0 - 400.0 K/uL 238.0 212.0 194.0    No results found for: VD25OH  Clinical ASCVD: Yes  The ASCVD Risk score (Arnett DK, et al., 2019) failed to calculate for the following reasons:   The 2019 ASCVD risk score is only valid for ages 58 to 30     Social History   Tobacco Use  Smoking Status Former   Packs/day: 4.00   Years: 30.00   Pack years: 120.00   Types: Cigarettes   Quit date: 01/21/1978   Years since quitting: 43.0  Smokeless Tobacco Never   BP Readings from Last 3 Encounters:  11/15/20 (!) 141/64  11/02/20 (!) 102/58  08/14/20  128/75   Pulse Readings from Last 3 Encounters:  11/15/20 100  11/02/20 (!) 115  08/14/20 100   Wt Readings from Last 3 Encounters:  11/15/20 236 lb (107 kg)  11/02/20 236 lb 9.6 oz (107.3 kg)  08/14/20 237 lb (107.5 kg)    Assessment: Review of patient past medical history, allergies, medications, health status, including review of consultants reports, laboratory and other test data, was performed as part of comprehensive evaluation and provision of chronic care management services.   SDOH:  (Social Determinants of Health) assessments and interventions performed:      CCM Care Plan  No Known Allergies  Medications Reviewed Today     Reviewed by Cherre Robins, RPH-CPP (Pharmacist) on 01/12/21 at 1125  Med List Status: <None>   Medication Order Taking? Sig Documenting Provider Last Dose Status Informant  albuterol (PROVENTIL) (2.5 MG/3ML) 0.083% nebulizer solution [id number] Yes Take 3 mLs (2.5 mg total) by nebulization every 6 (six) hours as needed for wheezing or shortness of breath. [name], [name], PA-C Taking Active   albuterol (VENTOLIN HFA) 108 (90 Base) MCG/ACT inhaler [id number] Yes Inhale 2 puffs into the lungs every 6 (six) hours as needed for wheezing or shortness of breath. Collene Gobble, MD Taking Active   aspirin 81 MG tablet [id number] Yes Take 1 tablet (81 mg total) by mouth every morning. Stop for 1 week and then resume  Patient taking differently: Take 81 mg by mouth every morning.   Barton Dubois, MD Taking Active   atorvastatin (LIPITOR) 40 MG tablet [id number] Yes 1 tab po q day [name], [place] Taking Active   celecoxib (CELEBREX) 200 MG capsule [id number] Yes TAKE 1 CAPSULE(200 MG) BY MOUTH DAILY AS NEEDED FOR MODERATE PAIN [name], [name], PA-C Taking Active   cholecalciferol (VITAMIN D) 1000 UNITS tablet [id number] Yes Take 2,000 Units by mouth 2 (two) times daily. [provider] Taking Active Self  fluticasone (FLONASE) 50 MCG/ACT nasal spray  [id number] Yes Place 1 spray into both nostrils daily. Collene Gobble, MD Taking Active   glucose blood (BAYER CONTOUR TEST) test strip [id number] Yes Use as instructed to check blood sugar twice a day.  DX  E11.9 [name], [name], PA-C Taking Active   Glycopyrrolate-Formoterol (BEVESPI AEROSPHERE) 9-4.8 MCG/ACT AERO [id number] No Inhale 2 puffs into the lungs 2 (two) times daily.  Patient not taking: Reported on 01/12/2021   Collene Gobble, MD Not Taking Active   guaiFENesin (MUCINEX) 600 MG 12 hr tablet [id number] Yes Take 1 tablet (600 mg total) by mouth 2 (two) times daily. Collene Gobble, MD Taking Active   meclizine (ANTIVERT) 12.5 MG tablet [id number] Yes Take 1 tablet (12.5 mg total) by mouth 3 (three) times daily as needed for dizziness. [name], [name], PA-C Taking Active   metFORMIN (GLUCOPHAGE) 500 MG tablet [id number] Yes TAKE 1 TABLET(500 MG) BY MOUTH THREE TIMES DAILY [name], [name], PA-C Taking Active   misoprostol (CYTOTEC) 100  MCG tablet [id number] Yes TAKE 1 TABLET BY MOUTH TWICE DAILY (AM&PM) AND TAKE 2 TABLETS AT BEDTIME  Patient taking differently: TAKE 1 TABLET BY MOUTH TWICE DAILY in morning and noon  AND TAKE 2 TABLETS AT BEDTIME   [name], [name], PA-C Taking Active   Multiple Vitamins-Minerals (CENTRUM) tablet [id number] Yes Take 1 tablet by mouth every morning. Reported on 01/05/2015 [provider] Taking Active Self           Med Note Barbaraann Boys Jun 26, 2020  3:34 PM)    OVER THE COUNTER MEDICATION [id number] Yes Place 1-2 drops into both eyes daily as needed (dry red eyes.). Reported on 01/05/2015 [provider] Taking Active Self  pioglitazone (ACTOS) 15 MG tablet [id number] Yes Take 1 tablet (15 mg total) by mouth daily. [name], PA-C Taking Active   Spacer/Aero-Holding Dorise Bullion [id number] Yes 1 Product by Does not apply route daily. Collene Gobble, MD Taking Active   UNABLE TO FIND [id number] Yes CPAP [provider] Taking  Active Self  venlafaxine XR (EFFEXOR-XR) 37.5 MG 24 hr capsule [id number] Yes Take 1 capsule (37.5 mg total) by mouth daily with breakfast. [name], [name], PA-C Taking Active   vitamin C (ASCORBIC ACID) 500 MG tablet [id number] Yes Take 500 mg by mouth every morning.  [provider] Taking Active Self            Patient Active Problem List   Diagnosis Date Noted   Accident due to mechanical fall without injury 06/11/2019   Sprain of medial collateral ligament of right knee 04/08/2019   Prepatellar bursitis of right knee 04/08/2019   Chronic respiratory failure ([place]) 11/06/2017   OA (osteoarthritis) of hip 07/27/2014   Abrasion 07/27/2014   Insomnia 06/27/2014   Chronic cholecystitis 06/16/2014   Coccyx pain 03/30/2014   Diabetes mellitus type II, controlled ([place]) 03/08/2014   Depression 03/08/2014   GERD (gastroesophageal reflux disease) 03/08/2014   Hyperlipidemia 03/08/2014   Abdominal pain    Diverticulitis of large intestine without perforation or abscess without bleeding    Blood poisoning    Calculus of gallbladder with acute cholecystitis 02/27/2014   Acute cholecystitis    Diverticulitis 02/25/2014   Urinary retention 02/25/2014   Hypertension 02/25/2014   SBO (small bowel obstruction) ([place]) 02/25/2014   Sepsis ([place]) 02/25/2014   Cholecystitis 02/25/2014   Slow transit constipation 02/25/2014   Seborrheic dermatitis of scalp 11/24/2013   Diarrhea 07/12/2013   Contusion of multiple sites 05/24/2013   ED (erectile dysfunction) of organic origin 05/06/2013   Seborrheic keratosis 05/06/2013   Cough 02/17/2013   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   Acute maxillary sinusitis 10/30/2012   Allergic rhinitis 06/27/2012   Hypertrophic and atrophic condition of skin 06/27/2012   Increased frequency of urination 06/27/2012   Lower urinary tract infectious disease 06/27/2012   Major depressive disorder, single episode, unspecified 06/27/2012   Malaise and fatigue  06/27/2012   Psychosexual dysfunction with inhibited sexual excitement 06/27/2012   Personal history of colonic polyps 01/06/2012   Special screening for malignant neoplasms, colon 11/15/2011   Pruritus ani 11/15/2011   COPD (chronic obstructive pulmonary disease) ([place]) 01/11/2011   DOE (dyspnea on exertion) 01/01/2011   Fissure in ano 12/21/2010   HYPERLIPIDEMIA 01/11/2009   Obstructive sleep apnea 01/11/2009   ARTHRITIS, RHEUMATOID 01/11/2009   OSTEOARTHRITIS 01/11/2009    Immunization History  Administered Date(s) Administered   Fluad Quad(high Dose 65+) 10/16/2018, 11/03/2019, 10/11/2020   Influenza Split  11/11/2010, 10/22/2011   Influenza Whole 01/22/2008, 11/22/2009   Influenza, High Dose Seasonal PF 09/27/2015, 11/04/2016, 10/22/2017   Influenza, Seasonal, Injecte, Preservative Fre 10/09/2010   Influenza,inj,Quad PF,6+ Mos 10/19/2012, 11/03/2013, 11/10/2014   Influenza-Unspecified 11/21/2013   Moderna Sars-Covid-2 Vaccination 02/23/2019, 03/24/2019   Pfizer Covid-19 Vaccine Bivalent Booster 60yr & up 10/11/2020   Pneumococcal Conjugate-13 02/15/2014, 03/27/2015   Pneumococcal Polysaccharide-23 10/31/2006, 01/22/2007   Tdap 03/27/2015    Conditions to be addressed/monitored: CAD, HTN, HLD, COPD, DMII, Depression and OSA; GERD; arthritis  Care Plan : General Pharmacy (Adult)  Updates made by ECherre Robins RPH-CPP since 01/12/2021 12:00 AM     Problem: Medication and chronic care management   Priority: High  Onset Date: 06/26/2020     Long-Range Goal: Medication Management and assistance with medication costs   Start Date: 06/26/2020  Recent Progress: Not on track  Priority: High  Note:   Current Barriers:  Unable to independently afford treatment regimen Unable to achieve control of COPD  Unable to self administer medications as prescribed Does not maintain contact with provider office  Pharmacist Clinical Goal(s):  Over the next 90 days, patient will  verbalize ability to afford treatment regimen achieve control of COPD as evidenced by improved breathing and utilization of maintenance therapy maintain control of HTN and type 2 DM as evidenced by maintaining goals below  adhere to prescribed medication regimen as evidenced by patient report and refill report  through collaboration with PharmD and provider.   Interventions: 1:1 collaboration with [name], EPercell Miller PA-C regarding development and update of comprehensive plan of care as evidenced by provider attestation and co-signature Inter-disciplinary care team collaboration (see longitudinal plan of care) Comprehensive medication review performed; medication list updated in electronic medical record  Chronic Obstructive Pulmonary Disease: Goal: reduce symptoms of COPD and improve adherence to maintenance inhaler therapy Managed by Dr BLamonte Sakai Current regimen:  Bevespi Inhaler - inhale 2 puffs twice a day Albuterol inhaler as needed Albuterol nebulizer as needed Supplemental O2 - daily / 24 hours Use of maintenance inhaler - no using Bevespi consistently due to cost Use of rescue inhaler - once every 10 days or so or about 3 times per month Bevespi Application mailed to patient 12/26/2020 but today he reports he has not received yet.  Interventions: Encouraged patient to use Bevespi. Starting process to apply for patient assistance.  Application mailed to patient again. Reviewed 2023 BCBS benefits: Bevespi not on formulary Potential alternatives is Bevespi patient assistance program not approved - Anoro or Stiolto are both $37; Symbicort and TMichae Kavaare also $37. Though patient did say $37 per month would also be a little difficult to afford.  Patient self care activities - Over the next 90 days, patient will: Take Bevespi 2 puffs twice a day Use albuterol inhaler or nebulizer as needed for rescue / shortness of breath Complete application for Bevespi patient assistance program and return  to office.   Hypertension Controlled; maintain BP goal <140/90 Current regimen:  Diet and exercise management   Interventions: (addressed at previous visit) Requested patient to check blood pressure 1 to 2 times per week and record Updated medication list and removed enalapril 2.5 mg (stopped by PCP 05/15/2020 - due to hypotension) Patient self care activities - Over the next 90 days, patient will: Check blood pressure 1 to 2 times per week, document, and provide at future appointments Ensure daily salt intake < 2300 mg/day  Hyperlipidemia / Elevated cholesterol/ CAD controlled LDL goal < 70 Current  regimen:  Atorvastatin 44m daily Aspirin 851mdaily  Interventions: (addressed at previous visit)  Maintain current cholesterol medication regimen. Reviewed last labs from 11/15/2020. Patient asked for labs to be mailed to him. Completed.    Diabetes Controlled; A1c goal <7%  Lab Results  Component Value Date   HGBA1C 6.5 05/15/2020  Current regimen:  Metformin 50029mhree times daily Pioglitazone 73m39mily  Patient self care activities - Over the next 90 days, patient will: Check blood sugar once or twice per week, document, and provide at future appointments Contact provider with any episodes of hypoglycemia (low blood glucose <80)  Medication management [facility] Comprehensive medication review performed. Reviewed each medication and reason for taking in depth with patient at his request.  Continue current medication management strategy Reviewed adherence / refill history:  Has not filled Stiolto in several months; See above COPD for plan to improve adherence Reminded that metformin is due to be filled.   Patient Goals/Self-Care Activities Over the next 90 days, patient will:  Take medications as prescribed check glucose daily , document, and provide at future appointments check blood pressure 1 to 2 times per week,  document, and provide at future appointments collaborate with provider on medication access solutions  Follow Up Plan: Telephone follow up appointment with care management team member scheduled for:  1 month to check on patient assistance program application for Bevespi.           Medication Assistance: Application for Bevespi  medication assistance program. in process.  Anticipated assistance start date 02/20/2021.  See plan of care for additional detail.  [id number] [place] [id number]: [phone]: [phone] Up:  Patient agrees to Care Plan and Follow-up.  Plan: The care management team will reach out to the patient again over the next 30 days.  TammCherre RobinsarmD Clinical Pharmacist [phone] have personally reviewed this encounter including the documentation in this note and have collaborated with the care management provider regarding care management and care coordination activities to include development and update of the comprehensive care plan. I am certifying that I agree with the content of this note and encounter as supervising provider    EdwaMackie Pai-C

## 2021-01-12 NOTE — Patient Instructions (Addendum)
[name] It was a pleasure speaking with you today.  I have attached a summary of our visit today and information about your health goals.   Our next appointment is by telephone on February 07, 2021 at 3:15pm  Please call the care guide team at [phone] if you need to cancel or reschedule your appointment.   If you have any questions or concerns, please feel free to contact me either at the phone number below or with a MyChart message.   Keep up the good work!  Cherre Robins, PharmD Clinical Pharmacist [place] [phone] (direct line)  [phone] (main office number)   Patient verbalizes understanding of instructions provided today and agrees to view in [place].    Visit Information  PATIENT GOALS:  Goals Addressed             This Visit's Progress    Chronic Care Management Pharmacy Care Plan       CARE PLAN ENTRY  Current Barriers:  Chronic Disease Management support, education, and care coordination needs related to COPD, type 2 Diabetes, Hypertension, Elevated cholesterol, depression, Osteoarthritis   Hypertension Pharmacist Clinical Goal(s): Over the next 90 days, patient will work with PharmD and providers to maintain BP goal <140/90 BP Readings from Last 3 Encounters:  11/15/20 (!) 141/64  11/02/20 (!) 102/58  08/14/20 128/75  Current regimen:  Diet and exercise management   Interventions: Requested patient to check blood pressure 1 to 2 times per week and record Updated medication list and removed enalapril 2.5 mg  Patient self care activities - Over the next 90 days, patient will: Check blood pressure 1 to 2 times per week, document, and provide at future appointments Ensure daily salt intake < 2300 mg/day  Hyperlipidemia / Elevated cholesterol Pharmacist Clinical Goal(s): Over the next 180 days, patient will work with PharmD and providers to maintain LDL goal < 70  Lipid Panel     Component Value  Date/Time   CHOL 118 11/15/2020 1522   TRIG 80.0 11/15/2020 1522   HDL 35.70 (L) 11/15/2020 1522   CHOLHDL 3 11/15/2020 1522   VLDL 16.0 11/15/2020 1522   LDLCALC 67 11/15/2020 1522  Current regimen:  Atorvastatin 40mg  daily Patient self care activities - Over the next 180 days, patient will: Maintain current cholesterol medication regimen.   Diabetes Pharmacist Clinical Goal(s): Over the next 90 days, patient will work with PharmD and providers to maintain A1c goal <7%  Lab Results  Component Value Date   HGBA1C 6.4 11/15/2020  Current regimen:  Metformin 500mg  three times daily Pioglitazone 15mg  daily  Patient self care activities - Over the next 90 days, patient will: Check blood sugar once daily, document, and provide at future appointments Contact provider with any episodes of hypoglycemia (low blood glucose <80)  Chronic Obstructive Pulmonary Disease: Goal: reduce symptoms of COPD and improve adherence to maintenance inhaler therapy Managed by Dr Lamonte Sakai  Current regimen:  Bevespi Inhaler - inhale 2 puffs twice a day  Albuterol inhaler as needed Albuterol nebulizer as needed Supplemental O2 - daily / 24 hours Interventions: Encouraged patient to use Bevespi. Starting process to apply for patient assistance.  Application mailed to patient again. Patient self care activities - Over the next 90 days, patient will: Take Bevespi 2 puffs twice a day Use albuterol inhaler or nebulizer as needed for rescue / shortness of breath Complete application for Bevespi patient assistance program and return to office.   Medication management Pharmacist  Clinical Goal(s): Over the next 90 days, patient will work with PharmD and providers to achieve optimal medication adherence Current pharmacy: [facility] Interventions Comprehensive medication review performed. Continue current medication management strategy Patient self care activities - Over the next 90 days,  patient will: Focus on medication adherence by filling medications appropriately  Take medications as prescribed Report any questions or concerns to PharmD and/or provider(s)  Please see past updates related to this goal by clicking on the "Past Updates" button in the selected goal          Patient verbalizes understanding of instructions provided today and agrees to view in [place].   Telephone follow up appointment with care management team member scheduled for: 83 days  Cherre Robins, PharmD Clinical Pharmacist [facility] [place] [facility]

## 2021-01-20 DIAGNOSIS — I10 Essential (primary) hypertension: Secondary | ICD-10-CM | POA: Diagnosis not present

## 2021-01-20 DIAGNOSIS — J449 Chronic obstructive pulmonary disease, unspecified: Secondary | ICD-10-CM

## 2021-01-31 ENCOUNTER — Other Ambulatory Visit: Payer: Self-pay | Admitting: Medical

## 2021-02-07 ENCOUNTER — Ambulatory Visit (INDEPENDENT_AMBULATORY_CARE_PROVIDER_SITE_OTHER): Payer: Medicare Other | Admitting: Pharmacist

## 2021-02-07 DIAGNOSIS — E785 Hyperlipidemia, unspecified: Secondary | ICD-10-CM

## 2021-02-07 DIAGNOSIS — J449 Chronic obstructive pulmonary disease, unspecified: Secondary | ICD-10-CM

## 2021-02-07 DIAGNOSIS — G4733 Obstructive sleep apnea (adult) (pediatric): Secondary | ICD-10-CM | POA: Diagnosis not present

## 2021-02-07 DIAGNOSIS — E118 Type 2 diabetes mellitus with unspecified complications: Secondary | ICD-10-CM

## 2021-02-07 DIAGNOSIS — J9610 Chronic respiratory failure, unspecified whether with hypoxia or hypercapnia: Secondary | ICD-10-CM | POA: Diagnosis not present

## 2021-02-07 DIAGNOSIS — I10 Essential (primary) hypertension: Secondary | ICD-10-CM

## 2021-02-10 NOTE — Chronic Care Management (AMB) (Addendum)
Chronic Care Management Pharmacy Note  02/10/2021 Name:  Lance Powell MRN:  [id number] DOB:  12/20/1929  Summary: Patient has not been using Bevespi inhaler for COPD due to cost (it is non formulary but pulmonology office got prior authorization.  Resent application for patient assistance program for Bevespi on 01/12/2021  but patient reports today that he has not completed due to his income being too high) Discuss applying for copay assistance funds but patient declined. At this time there are no copay assistance funds open for COPD anyway.  Checked with patient's insurance BCBS Medicare [phone]) and he has only met about $90 of his $505 required deductible.  Will notify pulmonary office. Might be able to provide samples of Bevespi until patient is able to meet deductible.   Subjective: Lance Powell is an 88 y.o. year old male who is a primary patient of [name], [name], [place].  The CCM team was consulted for assistance with disease management and care coordination needs.    Engaged with patient by telephone for follow up visit in response to provider referral for pharmacy case management and/or care coordination services.   Consent to Services:  The patient was given information about Chronic Care Management services, agreed to services, and gave verbal consent prior to initiation of services.  Please see initial visit note for detailed documentation.   Patient Care Team: [name], Iris Pert as PCP - General (Physician Assistant) Collene Gobble, MD as Consulting Physician (Pulmonary Disease) [name], MD as Consulting Physician (Ophthalmology) Cameron Sprang, MD as Consulting Physician (Neurology) Cherre Robins, [place] (Pharmacist)  Recent office visits: 11/15/2020 - Fam Med ([place], [facility]) Pre op exam for dental surgery. No med changes. Labs checked. 08/14/2020 - PCP ([name], PAC) F/U chronic conditions. Prescribed Kayexalate for elevated  potassium. 06/25/2020 - PCP ([place], [place]) - patient reported he had not been taking enalapril. Recommended to not restart to prevent hypotension  Recent consult visits: 11/09/2020 - Phone message at [facility]. PA needed for Bevespi. Approved 11/09/2020 to 11/09/2021 11/02/2020 - Pulmonology (Dr Lamonte Sakai) F/U COPD. Stopped Stiolto. Started Bevespi - 2 puff twice a day. Can use with spacer.  07/17/2020 - Ophthalmology (Dr Gershon Crane) Diabetic eye exam. No retinopathy OU  Hospital visits: None in previous 6 months  Objective:  Lab Results  Component Value Date   CREATININE 1.14 11/22/2020   CREATININE 1.15 11/15/2020   CREATININE 1.21 09/01/2020    Lab Results  Component Value Date   HGBA1C 6.4 11/15/2020   Last diabetic Eye exam:  Lab Results  Component Value Date/Time   HMDIABEYEEXA No Retinopathy 12/21/2015 12:00 AM    Last diabetic Foot exam: No results found for: HMDIABFOOTEX      Component Value Date/Time   CHOL 118 11/15/2020 1522   TRIG 80.0 11/15/2020 1522   HDL 35.70 (L) 11/15/2020 1522   CHOLHDL 3 11/15/2020 1522   VLDL 16.0 11/15/2020 1522   LDLCALC 67 11/15/2020 1522    Hepatic Function Latest Ref Rng & Units 11/22/2020 11/15/2020 09/01/2020  Total Protein 6.0 - 8.3 g/dL 6.5 6.5 6.9  Albumin 3.5 - 5.2 g/dL 4.0 4.0 -  AST 0 - 37 U/L '18 18 16  ' ALT 0 - 53 U/L '22 17 17  ' Alk Phosphatase 39 - 117 U/L 72 84 -  Total Bilirubin 0.2 - 1.2 mg/dL 0.6 0.4 0.6    Lab Results  Component Value Date/Time   TSH 3.35 09/28/2019 11:43 AM   TSH 1.706 02/26/2014 04:45 AM  CBC Latest Ref Rng & Units 11/15/2020 05/12/2019 12/09/2018  WBC 4.0 - 10.5 K/uL 8.9 6.9 9.2  Hemoglobin 13.0 - 17.0 g/dL 14.0 14.4 15.4  Hematocrit 39.0 - 52.0 % 43.7 44.6 46.6  Platelets 150.0 - 400.0 K/uL 238.0 212.0 194.0    No results found for: VD25OH  Clinical ASCVD: Yes  The ASCVD Risk score (Arnett DK, et al., 2019) failed to calculate for the following reasons:   The 2019 ASCVD risk score is  only valid for ages 86 to 10     Social History   Tobacco Use  Smoking Status Former   Packs/day: 4.00   Years: 30.00   Pack years: 120.00   Types: Cigarettes   Quit date: 01/21/1978   Years since quitting: 43.0  Smokeless Tobacco Never   BP Readings from Last 3 Encounters:  11/15/20 (!) 141/64  11/02/20 (!) 102/58  08/14/20 128/75   Pulse Readings from Last 3 Encounters:  11/15/20 100  11/02/20 (!) 115  08/14/20 100   Wt Readings from Last 3 Encounters:  11/15/20 236 lb (107 kg)  11/02/20 236 lb 9.6 oz (107.3 kg)  08/14/20 237 lb (107.5 kg)    Assessment: Review of patient past medical history, allergies, medications, health status, including review of consultants reports, laboratory and other test data, was performed as part of comprehensive evaluation and provision of chronic care management services.   SDOH:  (Social Determinants of Health) assessments and interventions performed:      CCM Care Plan  No Known Allergies  Medications Reviewed Today     Reviewed by Cherre Robins, RPH-CPP (Pharmacist) on 02/10/21 at 2102  Med List Status: <None>   Medication Order Taking? Sig Documenting Provider Last Dose Status Informant  albuterol (PROVENTIL) (2.5 MG/3ML) 0.083% nebulizer solution [id number] Yes Take 3 mLs (2.5 mg total) by nebulization every 6 (six) hours as needed for wheezing or shortness of breath. [name], [name], PA-C Taking Active   albuterol (VENTOLIN HFA) 108 (90 Base) MCG/ACT inhaler [id number] Yes Inhale 2 puffs into the lungs every 6 (six) hours as needed for wheezing or shortness of breath. Collene Gobble, MD Taking Active   aspirin 81 MG tablet [id number] Yes Take 1 tablet (81 mg total) by mouth every morning. Stop for 1 week and then resume  Patient taking differently: Take 81 mg by mouth every morning.   Barton Dubois, MD Taking Active   atorvastatin (LIPITOR) 40 MG tablet [id number] Yes 1 tab po q day [name], [place] Taking Active   celecoxib  (CELEBREX) 200 MG capsule [id number] Yes TAKE 1 CAPSULE(200 MG) BY MOUTH DAILY AS NEEDED FOR MODERATE PAIN [name], [name], PA-C Taking Active   cholecalciferol (VITAMIN D) 1000 UNITS tablet [id number] Yes Take 2,000 Units by mouth 2 (two) times daily. [provider] Taking Active Self  fluticasone (FLONASE) 50 MCG/ACT nasal spray [id number] Yes Place 1 spray into both nostrils daily. Collene Gobble, MD Taking Active   glucose blood (BAYER CONTOUR TEST) test strip [id number] Yes Use as instructed to check blood sugar twice a day.  DX  E11.9 [name], [name], PA-C Taking Active   Glycopyrrolate-Formoterol (BEVESPI AEROSPHERE) 9-4.8 MCG/ACT AERO [id number] No Inhale 2 puffs into the lungs 2 (two) times daily.  Patient not taking: Reported on 02/10/2021   Collene Gobble, MD Not Taking Active   guaiFENesin (MUCINEX) 600 MG 12 hr tablet [id number] Yes Take 1 tablet (600 mg total) by mouth 2 (two) times daily. Collene Gobble, MD Taking Active  meclizine (ANTIVERT) 12.5 MG tablet [id number] Yes Take 1 tablet (12.5 mg total) by mouth 3 (three) times daily as needed for dizziness. [name], [name], PA-C Taking Active   metFORMIN (GLUCOPHAGE) 500 MG tablet [id number] Yes TAKE 1 TABLET(500 MG) BY MOUTH THREE TIMES DAILY [name], Iris Pert Taking Active   misoprostol (CYTOTEC) 100 MCG tablet [id number] Yes TAKE 1 TABLET BY MOUTH TWICE DAILY AND 2 TABLETS AT BEDTIME [name], [name], PA-C Taking Active   Multiple Vitamins-Minerals (CENTRUM) tablet [id number] Yes Take 1 tablet by mouth every morning. Reported on 01/05/2015 [provider] Taking Active Self           Med Note Barbaraann Boys Jun 26, 2020  3:34 PM)    OVER THE COUNTER MEDICATION [id number] Yes Place 1-2 drops into both eyes daily as needed (dry red eyes.). Reported on 01/05/2015 [provider] Taking Active Self  pioglitazone (ACTOS) 15 MG tablet [id number] Yes Take 1 tablet (15 mg total) by mouth daily. [name], PA-C  Taking Active   Spacer/Aero-Holding Dorise Bullion [id number] Yes 1 Product by Does not apply route daily. Collene Gobble, MD Taking Active   UNABLE TO FIND [id number] Yes CPAP [provider] Taking Active Self  venlafaxine XR (EFFEXOR-XR) 37.5 MG 24 hr capsule [id number] Yes Take 1 capsule (37.5 mg total) by mouth daily with breakfast. [name], [name], PA-C Taking Active   vitamin C (ASCORBIC ACID) 500 MG tablet [id number] Yes Take 500 mg by mouth every morning.  [provider] Taking Active Self            Patient Active Problem List   Diagnosis Date Noted   Accident due to mechanical fall without injury 06/11/2019   Sprain of medial collateral ligament of right knee 04/08/2019   Prepatellar bursitis of right knee 04/08/2019   Chronic respiratory failure ([place]) 11/06/2017   OA (osteoarthritis) of hip 07/27/2014   Abrasion 07/27/2014   Insomnia 06/27/2014   Chronic cholecystitis 06/16/2014   Coccyx pain 03/30/2014   Diabetes mellitus type II, controlled ([place]) 03/08/2014   Depression 03/08/2014   GERD (gastroesophageal reflux disease) 03/08/2014   Hyperlipidemia 03/08/2014   Abdominal pain    Diverticulitis of large intestine without perforation or abscess without bleeding    Blood poisoning    Calculus of gallbladder with acute cholecystitis 02/27/2014   Acute cholecystitis    Diverticulitis 02/25/2014   Urinary retention 02/25/2014   Hypertension 02/25/2014   SBO (small bowel obstruction) ([place]) 02/25/2014   Sepsis ([place]) 02/25/2014   Cholecystitis 02/25/2014   Slow transit constipation 02/25/2014   Seborrheic dermatitis of scalp 11/24/2013   Diarrhea 07/12/2013   Contusion of multiple sites 05/24/2013   ED (erectile dysfunction) of organic origin 05/06/2013   Seborrheic keratosis 05/06/2013   Cough 02/17/2013   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   Acute maxillary sinusitis 10/30/2012   Allergic rhinitis 06/27/2012   Hypertrophic and atrophic condition of skin  06/27/2012   Increased frequency of urination 06/27/2012   Lower urinary tract infectious disease 06/27/2012   Major depressive disorder, single episode, unspecified 06/27/2012   Malaise and fatigue 06/27/2012   Psychosexual dysfunction with inhibited sexual excitement 06/27/2012   Personal history of colonic polyps 01/06/2012   Special screening for malignant neoplasms, colon 11/15/2011   Pruritus ani 11/15/2011   COPD (chronic obstructive pulmonary disease) ([place]) 01/11/2011   DOE (dyspnea on exertion) 01/01/2011   Fissure in ano 12/21/2010   HYPERLIPIDEMIA 01/11/2009   Obstructive sleep apnea 01/11/2009  ARTHRITIS, RHEUMATOID 01/11/2009   OSTEOARTHRITIS 01/11/2009    Immunization History  Administered Date(s) Administered   Fluad Quad(high Dose 65+) 10/16/2018, 11/03/2019, 10/11/2020   Influenza Split 11/11/2010, 10/22/2011   Influenza Whole 01/22/2008, 11/22/2009   Influenza, High Dose Seasonal PF 09/27/2015, 11/04/2016, 10/22/2017   Influenza, Seasonal, Injecte, Preservative Fre 10/09/2010   Influenza,inj,Quad PF,6+ Mos 10/19/2012, 11/03/2013, 11/10/2014   Influenza-Unspecified 11/21/2013   Moderna Sars-Covid-2 Vaccination 02/23/2019, 03/24/2019   Pfizer Covid-19 Vaccine Bivalent Booster 75yr & up 10/11/2020   Pneumococcal Conjugate-13 02/15/2014, 03/27/2015   Pneumococcal Polysaccharide-23 10/31/2006, 01/22/2007   Tdap 03/27/2015    Conditions to be addressed/monitored: CAD, HTN, HLD, COPD, DMII, Depression and OSA; GERD; arthritis  Care Plan : General Pharmacy (Adult)  Updates made by ECherre Robins RPH-CPP since 02/10/2021 12:00 AM     Problem: Medication and chronic care management   Priority: High  Onset Date: 06/26/2020     Long-Range Goal: Medication Management and assistance with medication costs   Start Date: 06/26/2020  Recent Progress: Not on track  Priority: High  Note:   Current Barriers:  Unable to independently afford treatment regimen Unable to  achieve control of COPD  Unable to self administer medications as prescribed Does not maintain contact with provider office  Pharmacist Clinical Goal(s):  Over the next 90 days, patient will verbalize ability to afford treatment regimen achieve control of COPD as evidenced by improved breathing and utilization of maintenance therapy maintain control of HTN and type 2 DM as evidenced by maintaining goals below  adhere to prescribed medication regimen as evidenced by patient report and refill report  through collaboration with PharmD and provider.   Interventions: 1:1 collaboration with [name], EPercell Miller PA-C regarding development and update of comprehensive plan of care as evidenced by provider attestation and co-signature Inter-disciplinary care team collaboration (see longitudinal plan of care) Comprehensive medication review performed; medication list updated in electronic medical record  Chronic Obstructive Pulmonary Disease: Goal: reduce symptoms of COPD and improve adherence to maintenance inhaler therapy Managed by Dr BLamonte Sakai Current regimen:  Bevespi Inhaler - inhale 2 puffs twice a day Albuterol inhaler as needed Albuterol nebulizer as needed Supplemental O2 - daily / 24 hours Use of maintenance inhaler - not using Bevespi consistently due to cost Use of rescue inhaler - once every 10 days or so or about 3 times per month Bevespi Application mailed to patient 12/2020. Today he reports that he did reviewed the patient assistance program application but after he reviewed, he states that his income is too high to qualify for patient assistance program  Interventions: Encouraged patient to use Bevespi daily as prescribed.  Reviewed 2023 BCBS benefits: Bevespi not on formulary Potential alternatives to Bevespi are Anoro or SDarden Restaurantsare both $37; Symbicort, Breztri and Trelegy are also $37. Though patient did say $37 per month would also be difficulty to afford. I offered to assist with  applying for assistance for copay if he qualified [place].  Patient self care activities - Over the next 90 days, patient will: Take Bevespi 2 puffs twice a day Use albuterol inhaler or nebulizer as needed for rescue / shortness of breath Notify clinical pharmacist  ([phone] if you change your mind about applying for copay assistance for inhaler therapy  Hypertension Controlled; maintain BP goal <140/90 Current regimen:  Diet and exercise management   Took enalapril in past (stopped by PCP due to low blood pressure) Interventions:  Requested  patient to check blood pressure 1 to 2 times per week and record Patient self care activities - Over the next 90 days, patient will: Check blood pressure 1 to 2 times per week, document, and provide at future appointments Ensure daily salt intake < 2300 mg/day  Hyperlipidemia / Elevated cholesterol/ CAD controlled LDL goal < 70 Current regimen:  Atorvastatin 42m daily Aspirin 831mdaily  Interventions: (addressed at previous visit)  Maintain current cholesterol medication regimen. Reviewed last labs from 11/15/2020. Patient asked for labs to be mailed to him. Completed.    Diabetes Controlled; A1c goal <7%  Lab Results  Component Value Date   HGBA1C 6.5 05/15/2020  Current regimen:  Metformin 50036mhree times daily Pioglitazone 35m77mily  Interventions: Check blood sugar once or twice per week, document, and provide at future appointments Contact provider with any episodes of hypoglycemia (low blood glucose <80)  Medication management [facility] Comprehensive medication review performed. Reviewed each medication and reason for taking in depth with patient at his request.  Continue current medication management strategy Reviewed adherence / refill history:  Has not filled Stiolto in several  months; See above COPD for plan to improve adherence   Patient Goals/Self-Care Activities Over the next 90 days, patient will:  Take medications as prescribed check glucose daily , document, and provide at future appointments check blood pressure 1 to 2 times per week, document, and provide at future appointments collaborate with provider on medication access solutions  Follow Up Plan: Telephone follow up appointment with care management team member scheduled for:  3 months           Medication Assistance: Application for Bevespi  medication assistance program sent to patient 12/2020 but he decided not to apply. States that he does not meet income requirement. Reviewed for possible copay assistance for Bevespi but there are not funds currently open for COPD  [id number] [address] [place] [phone]: [phone]: [phone] Up:  Patient agrees to Care Plan and Follow-up.  Plan: Telephone follow up appointment with care management team member scheduled for:  2 to 3 months  TammCherre RobinsarmD Clinical Pharmacist [phone] have personally reviewed this encounter including the documentation in this note and have collaborated with the care management provider regarding care management and care coordination activities to include development and update of the comprehensive care plan. I am certifying that I agree with the content of this note and encounter as supervising provider.  EdwaMackie Pai-C

## 2021-02-16 ENCOUNTER — Ambulatory Visit: Payer: Medicare Other | Admitting: Medical

## 2021-02-20 DIAGNOSIS — J449 Chronic obstructive pulmonary disease, unspecified: Secondary | ICD-10-CM

## 2021-02-20 DIAGNOSIS — I10 Essential (primary) hypertension: Secondary | ICD-10-CM

## 2021-02-20 DIAGNOSIS — E118 Type 2 diabetes mellitus with unspecified complications: Secondary | ICD-10-CM | POA: Diagnosis not present

## 2021-02-20 DIAGNOSIS — E785 Hyperlipidemia, unspecified: Secondary | ICD-10-CM

## 2021-02-20 DIAGNOSIS — Z794 Long term (current) use of insulin: Secondary | ICD-10-CM

## 2021-02-21 ENCOUNTER — Ambulatory Visit (HOSPITAL_BASED_OUTPATIENT_CLINIC_OR_DEPARTMENT_OTHER)
Admission: RE | Admit: 2021-02-21 | Discharge: 2021-02-21 | Disposition: A | Discharge status: Home / Self Care | Payer: Medicare Other | Source: Ambulatory Visit | Attending: Medical | Admitting: Medical

## 2021-02-21 ENCOUNTER — Ambulatory Visit: Payer: Medicare Other | Admitting: Medical

## 2021-02-21 ENCOUNTER — Other Ambulatory Visit: Payer: Self-pay

## 2021-02-21 VITALS — BP 126/90 | HR 100 | Resp 16 | Ht 73.0 in | Wt 237.0 lb

## 2021-02-21 DIAGNOSIS — E118 Type 2 diabetes mellitus with unspecified complications: Secondary | ICD-10-CM

## 2021-02-21 DIAGNOSIS — R5383 Other fatigue: Secondary | ICD-10-CM

## 2021-02-21 DIAGNOSIS — J810 Acute pulmonary edema: Secondary | ICD-10-CM

## 2021-02-21 DIAGNOSIS — J449 Chronic obstructive pulmonary disease, unspecified: Secondary | ICD-10-CM

## 2021-02-21 DIAGNOSIS — R0781 Pleurodynia: Secondary | ICD-10-CM | POA: Diagnosis not present

## 2021-02-21 DIAGNOSIS — I700 Atherosclerosis of aorta: Secondary | ICD-10-CM | POA: Diagnosis not present

## 2021-02-21 DIAGNOSIS — Z794 Long term (current) use of insulin: Secondary | ICD-10-CM | POA: Diagnosis not present

## 2021-02-21 DIAGNOSIS — J984 Other disorders of lung: Secondary | ICD-10-CM | POA: Diagnosis not present

## 2021-02-21 DIAGNOSIS — E785 Hyperlipidemia, unspecified: Secondary | ICD-10-CM | POA: Diagnosis not present

## 2021-02-21 DIAGNOSIS — J811 Chronic pulmonary edema: Secondary | ICD-10-CM | POA: Diagnosis not present

## 2021-02-21 DIAGNOSIS — S2242XA Multiple fractures of ribs, left side, initial encounter for closed fracture: Secondary | ICD-10-CM | POA: Diagnosis not present

## 2021-02-21 NOTE — Progress Notes (Signed)
Subjective:    Patient ID: Lance Powell, male    DOB: [birth year], 88 y.o.   MRN: [id number]  HPI  Pt in for follow up.  Last visit had preop evaluation prior to teeth removal. He will get dentures eventually.   Pt states 3 weeks ago he had some for a second or 2 then went away. Then 10 minutes later had brief sharp pain. Then another second event that lasted 1-2 seconds at most. No sob, no wheezing.   No pain since but can reproduce on palpation.  Pt has diabetes. He is on metformin. Recent piaglitazone cost too much  Pt has mild elevated triglycerides. Will repeat lipid panel today. Pt on atorvastatin 40 mg daily.   Bp well controlled today.   Copd hx. Pt on 02. Sees pulmonologist. On stioloto inhaler. Pt daughter states will get him scheduled.  Pt states he is having light sensitivity. He mentioned to eye MD. They told him nothing to do about that.   Review of Systems  Constitutional:  Negative for chills, fatigue and fever.  Respiratory:  Negative for cough, chest tightness, shortness of breath and wheezing.   Cardiovascular:  Negative for chest pain and palpitations.  Gastrointestinal:  Negative for abdominal pain, diarrhea, nausea and rectal pain.  Genitourinary:  Negative for dysuria and flank pain.  Musculoskeletal:  Negative for back pain.       Left lower rib area pain.  Skin:  Negative for rash.    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   COPD (chronic obstructive pulmonary disease) (HCC)    Depression 03/08/2014   Diabetes mellitus    GERD (gastroesophageal reflux disease) 03/08/2014   History of blood transfusion    Hyperlipemia    Hypertension    OSA (obstructive sleep apnea)    Osteoarthritis    Rheumatoid arthritis(714.0)    Sleep apnea    cpap     Social History   Socioeconomic History   Marital status: Married    Spouse name: Not on file   Number of children: 6   Years of education: Not on file   Highest  education level: Not on file  Occupational History   Occupation: Retired    Comment: Chief Financial Officer  Tobacco Use   Smoking status: Former    Packs/day: 4.00    Years: 30.00    Pack years: 120.00    Types: Cigarettes    Quit date: 01/21/1978    Years since quitting: 43.1   Smokeless tobacco: Never  Vaping Use   Vaping Use: Never used  Substance and Sexual Activity   Alcohol use: No   Drug use: No   Sexual activity: Not on file  Other Topics Concern   Not on file  Social History Narrative   Right Handed   Lives in a one story home   Drinks no caffeine    Social Determinants of Health   Financial Resource Strain: Medium Risk   Difficulty of Paying Living Expenses: Somewhat hard  Food Insecurity: Not on file  Transportation Needs: Not on file  Physical Activity: Inactive   Days of Exercise per Week: 0 days   Minutes of Exercise per Session: 0 min  Stress: Not on file  Social Connections: Not on file  Intimate Partner Violence: Not on file    Past Surgical History:  Procedure Laterality Date   [place] N/A 06/16/2014   Procedure: LAPAROSCOPIC CHOLECYSTECTOMY;  Surgeon:  Greer Pickerel, MD;  Location: [facility];  Service: General;  Laterality: N/A;   CYSTOSCOPY  01/23/2011   Procedure: CYSTOSCOPY;  Surgeon: Molli Hazard, MD;  Location: [facility];  Service: Urology;  Laterality: N/A;   FLEXIBLE SIGMOIDOSCOPY  01/07/2012   Procedure: FLEXIBLE SIGMOIDOSCOPY;  Surgeon: Ladene Artist, MD,FACG;  Location: [facility];  Service: Endoscopy;  Laterality: N/A;   HERNIA REPAIR  1990   right and left inguinal   NISSEN FUNDOPLICATION     SIGMOIDECTOMY  2004   colovesical fistula   TONSILLECTOMY  1937   TRANSURETHRAL RESECTION OF PROSTATE  01/23/2011   Procedure: TRANSURETHRAL RESECTION OF THE PROSTATE WITH GYRUS INSTRUMENTS;  Surgeon: Molli Hazard, MD;  Location: [facility];  Service: Urology;  Laterality: N/A;    VASECTOMY  1972    Family History  Problem Relation Age of Onset   Heart disease Mother    Emphysema Brother    Cancer Daughter        breast    No Known Allergies  Current Outpatient Medications on File Prior to Visit  Medication Sig Dispense Refill   albuterol (PROVENTIL) (2.5 MG/3ML) 0.083% nebulizer solution Take 3 mLs (2.5 mg total) by nebulization every 6 (six) hours as needed for wheezing or shortness of breath. 150 mL 1   albuterol (VENTOLIN HFA) 108 (90 Base) MCG/ACT inhaler Inhale 2 puffs into the lungs every 6 (six) hours as needed for wheezing or shortness of breath. 8 g 2   aspirin 81 MG tablet Take 1 tablet (81 mg total) by mouth every morning. Stop for 1 week and then resume (Patient taking differently: Take 81 mg by mouth every morning.)     atorvastatin (LIPITOR) 40 MG tablet 1 tab po q day 90 tablet 3   celecoxib (CELEBREX) 200 MG capsule TAKE 1 CAPSULE(200 MG) BY MOUTH DAILY AS NEEDED FOR MODERATE PAIN 30 capsule 1   cholecalciferol (VITAMIN D) 1000 UNITS tablet Take 2,000 Units by mouth 2 (two) times daily.     fluticasone (FLONASE) 50 MCG/ACT nasal spray Place 1 spray into both nostrils daily. 16 g 2   glucose blood (BAYER CONTOUR TEST) test strip Use as instructed to check blood sugar twice a day.  DX  E11.9 100 each 5   Glycopyrrolate-Formoterol (BEVESPI AEROSPHERE) 9-4.8 MCG/ACT AERO Inhale 2 puffs into the lungs 2 (two) times daily. 1 each 5   guaiFENesin (MUCINEX) 600 MG 12 hr tablet Take 1 tablet (600 mg total) by mouth 2 (two) times daily. 60 tablet 2   meclizine (ANTIVERT) 12.5 MG tablet Take 1 tablet (12.5 mg total) by mouth 3 (three) times daily as needed for dizziness. 30 tablet 0   metFORMIN (GLUCOPHAGE) 500 MG tablet TAKE 1 TABLET(500 MG) BY MOUTH THREE TIMES DAILY 90 tablet 3   misoprostol (CYTOTEC) 100 MCG tablet TAKE 1 TABLET BY MOUTH TWICE DAILY AND 2 TABLETS AT BEDTIME 360 tablet 3   Multiple Vitamins-Minerals (CENTRUM) tablet Take 1 tablet by mouth  every morning. Reported on 01/05/2015     OVER THE COUNTER MEDICATION Place 1-2 drops into both eyes daily as needed (dry red eyes.). Reported on 01/05/2015     pioglitazone (ACTOS) 15 MG tablet Take 1 tablet (15 mg total) by mouth daily. 90 tablet 3   Spacer/Aero-Holding Chambers DEVI 1 Product by Does not apply route daily. 1 Product 0   UNABLE TO FIND CPAP     venlafaxine XR (EFFEXOR-XR) 37.5 MG 24 hr  capsule Take 1 capsule (37.5 mg total) by mouth daily with breakfast. 90 capsule 3   vitamin C (ASCORBIC ACID) 500 MG tablet Take 500 mg by mouth every morning.      No current facility-administered medications on file prior to visit.    BP 126/90    Pulse 100    Resp 16    Ht 6\' 1"  (1.854 m)    Wt 237 lb (107.5 kg)    SpO2 90%    BMI 31.27 kg/m       Objective:   Physical Exam  General Mental Status- Alert. General Appearance- Not in acute distress.   Skin General: Color- Normal Color. Moisture- Normal Moisture.  Neck Carotid Arteries- Normal color. Moisture- Normal Moisture. No carotid bruits. No JVD.  Chest and Lung Exam Auscultation: Breath Sounds:-Normal.  Cardiovascular Auscultation:Rythm- Regular. Murmurs & Other Heart Sounds:Auscultation of the heart reveals- No Murmurs.  Abdomen Inspection:-Inspeection Normal. Palpation/Percussion:Note:No mass. Palpation and Percussion of the abdomen reveal- Non Tender, Non Distended + BS, no rebound or guarding.    Neurologic Cranial Nerve exam:- CN III-XII intact(No nystagmus), symmetric smile. Strength:- 5/5 equal and symmetric strength both upper and lower extremities.   Eyes- peerl, light sensitive. Drooping lids.  Left lower rib-point tender in the region where her bone needs cartilage.  No pain on deep inspiration.  No rash on inspection area.    Assessment & Plan:   Patient Instructions  For diabetes, continue metformin and pioglitazone.  We will get metabolic panel and O1H today.  I will review labs and decide if  any changes need to be made.  Low HDL cholesterol in the past.  We will get lipid panel today.  Some fatigue reported.  Will get TSH and T4.  COPD stable.  Continue current inhalers and O2 daily.  Follow-up with pulmonologist as regularly scheduled.  Described light sensitive chronic issue.  Recommend following up with your ophthalmologist.  Would like also to get opinion on your drooping eyelids.  Right side worse than the left.  Also we discussed occasional dry discharge of both eyes.  If you see discharge that is matting and accumulating thick enough that you have to wash your eye to open and then let me know and we will prescribe an antibiotic eyedrop.  By exam today does not appear to conjunctivitis light.  Left lower rib pain that is point tender.  I will get left rib series with chest x-ray.  Pain that you describe was not cardiac like.  If you do get any cardiac like pain that is constant then recommend ED evaluation.  Follow-up date to be determined after lab review.   [name], PA-C

## 2021-02-21 NOTE — Addendum Note (Signed)
Addended by: Anabel Halon on: 02/21/2021 05:06 PM   Modules accepted: Orders

## 2021-02-21 NOTE — Patient Instructions (Addendum)
For diabetes, continue metformin and pioglitazone.  We will get metabolic panel and H9X today.  I will review labs and decide if any changes need to be made.  Low HDL cholesterol in the past.  We will get lipid panel today.  Some fatigue reported.  Will get TSH and T4.  COPD stable.  Continue current inhalers and O2 daily.  Follow-up with pulmonologist as regularly scheduled.  Described light sensitive chronic issue.  Recommend following up with your ophthalmologist.  Would like also to get opinion on your drooping eyelids.  Right side worse than the left.  Also we discussed occasional dry discharge of both eyes.  If you see discharge that is matting and accumulating thick enough that you have to wash your eye to open and then let me know and we will prescribe an antibiotic eyedrop.  By exam today does not appear to conjunctivitis light.  Left lower rib pain that is point tender.  I will get left rib series with chest x-ray.  Pain that you describe was not cardiac like.  If you do get any cardiac like pain that is constant then recommend ED evaluation.  Follow-up date to be determined after lab review.

## 2021-02-22 LAB — LIPID PANEL
Cholesterol: 130 mg/dL (ref 0–200)
HDL: 38.4 mg/dL — ABNORMAL LOW (ref >39.00)
LDL Cholesterol: 73 mg/dL (ref 0–99)
NonHDL: 91.13
Total CHOL/HDL Ratio: 3
Triglycerides: 92 mg/dL (ref 0.0–149.0)
VLDL: 18.4 mg/dL (ref 0.0–40.0)

## 2021-02-22 LAB — HEMOGLOBIN A1C: Hgb A1c MFr Bld: 6.5 % (ref 4.6–6.5)

## 2021-02-22 LAB — T4, FREE: Free T4: 0.64 ng/dL (ref 0.60–1.60)

## 2021-02-22 LAB — COMPREHENSIVE METABOLIC PANEL
ALT: 15 U/L (ref 0–53)
AST: 14 U/L (ref 0–37)
Albumin: 4 g/dL (ref 3.5–5.2)
Alkaline Phosphatase: 77 U/L (ref 39–117)
BUN: 25 mg/dL — ABNORMAL HIGH (ref 6–23)
CO2: 34 mEq/L — ABNORMAL HIGH (ref 19–32)
Calcium: 11.1 mg/dL — ABNORMAL HIGH (ref 8.4–10.5)
Chloride: 103 mEq/L (ref 96–112)
Creatinine, Ser: 1.25 mg/dL (ref 0.40–1.50)
GFR: 51.09 mL/min — ABNORMAL LOW (ref >60.00)
Glucose, Bld: 120 mg/dL — ABNORMAL HIGH (ref 70–99)
Potassium: 4.6 mEq/L (ref 3.5–5.1)
Sodium: 144 mEq/L (ref 135–145)
Total Bilirubin: 0.4 mg/dL (ref 0.2–1.2)
Total Protein: 7 g/dL (ref 6.0–8.3)

## 2021-02-22 LAB — TSH: TSH: 2.31 u[IU]/mL (ref 0.35–5.50)

## 2021-02-23 ENCOUNTER — Ambulatory Visit: Payer: Medicare Other

## 2021-02-23 NOTE — Progress Notes (Deleted)
Subjective:   Lance Powell is a 88 y.o. male who presents for Medicare Annual/Subsequent preventive examination.  I connected with Verlie today by telephone and verified that I am speaking with the correct person using two identifiers. Location patient: home Location provider: work Persons participating in the virtual visit: patient, Marine scientist.    I discussed the limitations, risks, security and privacy concerns of performing an evaluation and management service by telephone and the availability of in person appointments. I also discussed with the patient that there may be a patient responsible charge related to this service. The patient expressed understanding and verbally consented to this telephonic visit.    Interactive audio and video telecommunications were attempted between this provider and patient, however failed, due to patient having technical difficulties OR patient did not have access to video capability.  We continued and completed visit with audio only.  Some vital signs may be absent or patient reported.   Time Spent with patient on telephone encounter: *** minutes   Review of Systems    ***       Objective:    There were no vitals filed for this visit. There is no height or weight on file to calculate BMI.  Advanced Directives 09/28/2019 09/29/2018 09/14/2018 09/26/2017 09/19/2016 05/06/2016 04/19/2016  Does Patient Have a Medical Advance Directive? Yes Yes Yes Yes Yes Yes Yes  Type of Paramedic of [place];Living will;Out of facility DNR (pink MOST or yellow form) [place];Living will Healthcare Power of [place];Living will [place];Living will - [place]  Does patient want to make changes to medical advance directive? - No - Patient declined - - No - Patient declined No - Patient declined -  Copy of [place] in Chart? - No - copy requested  - Yes No - copy requested - -  Pre-existing out of facility DNR order (yellow form or pink MOST form) - - - - - - -    Current Medications (verified) Outpatient Encounter Medications as of 02/23/2021  Medication Sig   albuterol (PROVENTIL) (2.5 MG/3ML) 0.083% nebulizer solution Take 3 mLs (2.5 mg total) by nebulization every 6 (six) hours as needed for wheezing or shortness of breath.   albuterol (VENTOLIN HFA) 108 (90 Base) MCG/ACT inhaler Inhale 2 puffs into the lungs every 6 (six) hours as needed for wheezing or shortness of breath.   aspirin 81 MG tablet Take 1 tablet (81 mg total) by mouth every morning. Stop for 1 week and then resume (Patient taking differently: Take 81 mg by mouth every morning.)   atorvastatin (LIPITOR) 40 MG tablet 1 tab po q day   celecoxib (CELEBREX) 200 MG capsule TAKE 1 CAPSULE(200 MG) BY MOUTH DAILY AS NEEDED FOR MODERATE PAIN   cholecalciferol (VITAMIN D) 1000 UNITS tablet Take 2,000 Units by mouth 2 (two) times daily.   fluticasone (FLONASE) 50 MCG/ACT nasal spray Place 1 spray into both nostrils daily.   glucose blood (BAYER CONTOUR TEST) test strip Use as instructed to check blood sugar twice a day.  DX  E11.9   Glycopyrrolate-Formoterol (BEVESPI AEROSPHERE) 9-4.8 MCG/ACT AERO Inhale 2 puffs into the lungs 2 (two) times daily.   guaiFENesin (MUCINEX) 600 MG 12 hr tablet Take 1 tablet (600 mg total) by mouth 2 (two) times daily.   meclizine (ANTIVERT) 12.5 MG tablet Take 1 tablet (12.5 mg total) by mouth 3 (three) times daily as needed for  dizziness.   metFORMIN (GLUCOPHAGE) 500 MG tablet TAKE 1 TABLET(500 MG) BY MOUTH THREE TIMES DAILY   misoprostol (CYTOTEC) 100 MCG tablet TAKE 1 TABLET BY MOUTH TWICE DAILY AND 2 TABLETS AT BEDTIME   Multiple Vitamins-Minerals (CENTRUM) tablet Take 1 tablet by mouth every morning. Reported on 01/05/2015   OVER THE COUNTER MEDICATION Place 1-2 drops into both eyes daily as needed (dry red eyes.). Reported on 01/05/2015    pioglitazone (ACTOS) 15 MG tablet Take 1 tablet (15 mg total) by mouth daily.   Spacer/Aero-Holding Chambers DEVI 1 Product by Does not apply route daily.   UNABLE TO FIND CPAP   venlafaxine XR (EFFEXOR-XR) 37.5 MG 24 hr capsule Take 1 capsule (37.5 mg total) by mouth daily with breakfast.   vitamin C (ASCORBIC ACID) 500 MG tablet Take 500 mg by mouth every morning.    No facility-administered encounter medications on file as of 02/23/2021.    Allergies (verified) Patient has no known allergies.   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   COPD (chronic obstructive pulmonary disease) ([place])    Depression 03/08/2014   Diabetes mellitus    GERD (gastroesophageal reflux disease) 03/08/2014   History of blood transfusion    Hyperlipemia    Hypertension    OSA (obstructive sleep apnea)    Osteoarthritis    Rheumatoid arthritis(714.0)    Sleep apnea    cpap   Past Surgical History:  Procedure Laterality Date   APPENDECTOMY  1969   CHOLECYSTECTOMY N/A 06/16/2014   Procedure: LAPAROSCOPIC CHOLECYSTECTOMY;  Surgeon: Greer Pickerel, MD;  Location: [facility];  Service: General;  Laterality: N/A;   CYSTOSCOPY  01/23/2011   Procedure: Consuela Mimes;  Surgeon: Molli Hazard, MD;  Location: [facility];  Service: Urology;  Laterality: N/A;   FLEXIBLE SIGMOIDOSCOPY  01/07/2012   Procedure: FLEXIBLE SIGMOIDOSCOPY;  Surgeon: Ladene Artist, MD,FACG;  [facility];  Service: Endoscopy;  Laterality: N/A;   HERNIA REPAIR  1990   right and left inguinal   NISSEN FUNDOPLICATION     SIGMOIDECTOMY  2004   colovesical fistula   TONSILLECTOMY  1937   TRANSURETHRAL RESECTION OF PROSTATE  01/23/2011   Procedure: TRANSURETHRAL RESECTION OF THE PROSTATE WITH GYRUS INSTRUMENTS;  Surgeon: Molli Hazard, MD;  Location: [facility];  Service: Urology;  Laterality: N/A;   VASECTOMY  1972   Family History  Problem Relation Age of  Onset   Heart disease Mother    Emphysema Brother    Cancer Daughter        breast   Social History   Socioeconomic History   Marital status: Married    Spouse name: Not on file   Number of children: 6   Years of education: Not on file   Highest education level: Not on file  Occupational History   Occupation: Retired    Comment: Chief Financial Officer  Tobacco Use   Smoking status: Former    Packs/day: 4.00    Years: 30.00    Pack years: 120.00    Types: Cigarettes    Quit date: 01/21/1978    Years since quitting: 43.1   Smokeless tobacco: Never  Vaping Use   Vaping Use: Never used  Substance and Sexual Activity   Alcohol use: No   Drug use: No   Sexual activity: Not on file  Other Topics Concern   Not on file  Social History Narrative   Right Handed  Lives in a one story home   Drinks no caffeine    Social Determinants of Health   Financial Resource Strain: Medium Risk   Difficulty of Paying Living Expenses: Somewhat hard  Food Insecurity: Not on file  Transportation Needs: Not on file  Physical Activity: Inactive   Days of Exercise per Week: 0 days   Minutes of Exercise per Session: 0 min  Stress: Not on file  Social Connections: Not on file    Tobacco Counseling Counseling given: Not Answered   Clinical Intake:                 Diabetes:  Is the patient diabetic?  Yes  If diabetic, was a CBG obtained today?  No  Did the patient bring in their glucometer from home?  No phone visit How often do you monitor your CBG's? ***.   Financial Strains and Diabetes Management:  Are you having any financial strains with the device, your supplies or your medication? {YES/NO:21197}.  Does the patient want to be seen by Chronic Care Management for management of their diabetes?  {YES/NO:21197} Would the patient like to be referred to a Nutritionist or for Diabetic Management?  {YES/NO:21197}  Diabetic Exams:  Diabetic Eye Exam: Completed 07/18/2020.   Diabetic  Foot Exam: Completed 08/14/2020.          Activities of Daily Living No flowsheet data found.  Patient Care Team: [name], Iris Pert as PCP - General (Physician Assistant) Collene Gobble, MD as Consulting Physician (Pulmonary Disease) [name], MD as Consulting Physician (Ophthalmology) Cameron Sprang, MD as Consulting Physician (Neurology) Cherre Robins, [place] (Pharmacist)  Indicate any recent Medical Services you may have received from other than [facility] providers in the past year (date may be approximate).     Assessment:   This is a routine wellness examination for [place].  Hearing/Vision screen No results found.  Dietary issues and exercise activities discussed:     Goals Addressed   None    Depression Screen PHQ 2/9 Scores 09/29/2018 09/26/2017 09/19/2016 05/24/2016 06/28/2015 03/27/2015 03/08/2014  PHQ - 2 Score 0 0 0 0 0 0 2  PHQ- 9 Score - - - - - - 3  Exception Documentation - - - - Patient refusal - Medical reason    Fall Risk Fall Risk  02/21/2021 09/28/2019 09/29/2018 09/26/2017 05/24/2016  Falls in the past year? 1 1 1  No No  Number falls in past yr: 0 1 0 - -  Injury with Fall? 0 1 1 - -  Follow up Falls evaluation completed - Education provided;Falls prevention discussed - -    FALL RISK PREVENTION PERTAINING TO THE HOME:  Any stairs in or around the home? {YES/NO:21197} If so, are there any without handrails? {YES/NO:21197} Home free of loose throw rugs in walkways, pet beds, electrical cords, etc? {YES/NO:21197} Adequate lighting in your home to reduce risk of falls? {YES/NO:21197}  ASSISTIVE DEVICES UTILIZED TO PREVENT FALLS:  Life alert? {YES/NO:21197} Use of a cane, walker or w/c? {YES/NO:21197} Grab bars in the bathroom? {YES/NO:21197} Shower chair or bench in shower? {YES/NO:21197} Elevated toilet seat or a handicapped toilet? {YES/NO:21197}  TIMED UP AND GO:  Was the test performed? {YES/NO:21197}.  Length of time to ambulate 10 feet: ***  sec.   {Appearance of Gait:2101803}  Cognitive Function: MMSE - Mini Mental State Exam 09/26/2017 09/19/2016  Orientation to time 5 5  Orientation to Place 5 5  Registration 3 3  Attention/ Calculation 5 5  Recall 2 3  Language- name 2 objects 2 2  Language- repeat 1 1  Language- follow 3 step command 3 3  Language- read & follow direction 1 1  Write a sentence 1 1  Copy design 1 1  Total score 29 30     6CIT Screen 09/29/2018  What Year? 0 points  What month? 0 points  What time? 0 points  Count back from 20 0 points  Months in reverse 0 points  Repeat phrase 6 points  Total Score 6    Immunizations Immunization History  Administered Date(s) Administered   Fluad Quad(high Dose 65+) 10/16/2018, 11/03/2019, 10/11/2020   Influenza Split 11/11/2010, 10/22/2011   Influenza Whole 01/22/2008, 11/22/2009   Influenza, High Dose Seasonal PF 09/27/2015, 11/04/2016, 10/22/2017   Influenza, Seasonal, Injecte, Preservative Fre 10/09/2010   Influenza,inj,Quad PF,6+ Mos 10/19/2012, 11/03/2013, 11/10/2014   Influenza-Unspecified 11/21/2013   Moderna Sars-Covid-2 Vaccination 02/23/2019, 03/24/2019   Pfizer Covid-19 Vaccine Bivalent Booster 49yrs & up 10/11/2020   Pneumococcal Conjugate-13 02/15/2014, 03/27/2015   Pneumococcal Polysaccharide-23 10/31/2006, 01/22/2007   Tdap 03/27/2015    TDAP status: Up to date  Flu Vaccine status: Up to date  Pneumococcal vaccine status: Up to date  Covid-19 vaccine status: Completed vaccines  Qualifies for Shingles Vaccine? Yes   Zostavax completed No   Shingrix Completed?: No.    Education has been provided regarding the importance of this vaccine. Patient has been advised to call insurance company to determine out of pocket expense if they have not yet received this vaccine. Advised may also receive vaccine at local pharmacy or Health Dept. Verbalized acceptance and understanding.  Screening Tests Health Maintenance  Topic Date Due   Zoster  Vaccines- Shingrix (1 of 2) Never done   URINE MICROALBUMIN  09/13/2017   COVID-19 Vaccine (4 - Booster) 12/06/2020   OPHTHALMOLOGY EXAM  07/18/2021   FOOT EXAM  08/14/2021   HEMOGLOBIN A1C  08/21/2021   TETANUS/TDAP  03/26/2025   Pneumonia Vaccine 75+ Years old  Completed   INFLUENZA VACCINE  Completed   HPV VACCINES  Aged Out    Health Maintenance  Health Maintenance Due  Topic Date Due   Zoster Vaccines- Shingrix (1 of 2) Never done   URINE MICROALBUMIN  09/13/2017   COVID-19 Vaccine (4 - Booster) 12/06/2020    Colorectal cancer screening: No longer required.   Lung Cancer Screening: (Low Dose CT Chest recommended if Age 3-80 years, 30 pack-year currently smoking OR have quit w/in 15years.) does not qualify.     Additional Screening:  Hepatitis C Screening: does not qualify  Vision Screening: Recommended annual ophthalmology exams for early detection of glaucoma and other disorders of the eye. Is the patient up to date with their annual eye exam?  {YES/NO:21197} Who is the provider or what is the name of the office in which the patient attends annual eye exams? *** If pt is not established with a provider, would they like to be referred to a provider to establish care? {YES/NO:21197}.   Dental Screening: Recommended annual dental exams for proper oral hygiene  Community Resource Referral / Chronic Care Management: CRR required this visit?  {YES/NO:21197}  CCM required this visit?  {YES/NO:21197}     Plan:     I have personally reviewed and noted the following in the patients chart:   Medical and social history Use of alcohol, tobacco or illicit drugs  Current medications and supplements including opioid prescriptions. {Opioid Prescriptions:[phone]} Functional ability and status Nutritional status  Physical activity Advanced directives List of other physicians Hospitalizations, surgeries, and ER visits in previous 12 months Vitals Screenings to  include cognitive, depression, and falls Referrals and appointments  In addition, I have reviewed and discussed with patient certain preventive protocols, quality metrics, and best practice recommendations. A written personalized care plan for preventive services as well as general preventive health recommendations were provided to patient.   Due to this being a telephonic visit, the after visit summary with patients personalized plan was offered to patient via mail or my-chart. ***Patient declined at this time./ Patient would like to access on my-chart/ per request, patient was mailed a copy of AVS./ Patient preferred to pick up at office at next visit.   Marta Antu, LPN   02/28/6392  Nurse Health Advisor  Nurse Notes: ***

## 2021-02-23 NOTE — Patient Instructions (Signed)
Mr. Longton It was a pleasure speaking with you today.  I have attached a summary of our visit today and information about your health goals.    If you have any questions or concerns, please feel free to contact me either at the phone number below or with a MyChart message.   Keep up the good work!  Cherre Robins, PharmD Clinical Pharmacist [facility] [phone] (direct line)  [phone] (main office number)  CARE PLAN ENTRY  Current Barriers:  Chronic Disease Management support, education, and care coordination needs related to COPD, type 2 Diabetes, Hypertension, Elevated cholesterol, depression, Osteoarthritis   Hypertension Pharmacist Clinical Goal(s): Over the next 90 days, patient will work with PharmD and providers to achieve BP goal <140/90 BP Readings from Last 3 Encounters:  11/15/20 (!) 141/64  11/02/20 (!) 102/58  08/14/20 128/75   Current regimen:  Diet and exercise management   Interventions: Requested patient to check blood pressure 1 to 2 times per week and record Patient self care activities - Over the next 90 days, patient will: Check blood pressure 1 to 2 times per week, document, and provide at future appointments Ensure daily salt intake < 2300 mg/day  Hyperlipidemia / Elevated cholesterol Pharmacist Clinical Goal(s): Over the next 180 days, patient will work with PharmD and providers to maintain LDL goal < 70  Lipid Panel     Component Value Date/Time   CHOL 118 11/15/2020 1522   TRIG 80.0 11/15/2020 1522   HDL 35.70 (L) 11/15/2020 1522   CHOLHDL 3 11/15/2020 1522   VLDL 16.0 11/15/2020 1522   LDLCALC 67 11/15/2020 1522   Current regimen:  Atorvastatin 86m daily Patient self care activities - Over the next 180 days, patient will: Maintain current cholesterol medication regimen.   Diabetes Pharmacist Clinical Goal(s): Over the next 90 days, patient will work with PharmD and providers to maintain A1c  goal <7%  Lab Results  Component Value Date   HGBA1C 6.4 11/15/2020   Current regimen:  Metformin 5030mthree times daily Pioglitazone 1564maily  Patient self care activities - Over the next 90 days, patient will: Check blood sugar once daily, document, and provide at future appointments Contact provider with any episodes of hypoglycemia (low blood glucose <80)  Chronic Obstructive Pulmonary Disease: Goal: reduce symptoms of COPD and improve adherence to maintenance inhaler therapy Managed by Dr ByrLamonte Sakaiurrent regimen:  Bevespi Inhaler - inhale 2 puffs twice a day  Albuterol inhaler as needed Albuterol nebulizer as needed Supplemental O2 - daily / 24 hours Interventions: Encouraged patient to use Bevespi.  Identified potential alternatives to Bevespi that are tier 3 Patient self care activities - Over the next 90 days, patient will: Take Bevespi 2 puffs twice a day Use albuterol inhaler or nebulizer as needed for rescue / shortness of breath Discuss with pulmonologist alternatives to [place] Bevespi is not on formulary.  Tier 3 medications for would be around $25 to$ 47 per month are Anoro and Stiolto.  Other tier 3 medications - Breztri, Advair, Symbicort and Trelegy Tier 4 medications - Atrovent and Combivent You do have a $505 deductible to meet before any coverage starts for branded medications (you have currently met about $90 - have to spend about $410 more)  Medication management Pharmacist Clinical Goal(s): Over the next 90 days, patient will work with PharmD and providers to achieve optimal medication adherence [facility] Comprehensive medication review performed. Continue current medication  management strategy Patient self care activities - Over the next 90 days, patient will: Focus on medication adherence by filling medications appropriately  Take medications as prescribed Report any questions or  concerns to PharmD and/or provider(s) Patient verbalizes understanding of instructions and care plan provided today and agrees to view in [place]. Active MyChart status confirmed with patient.

## 2021-03-01 DIAGNOSIS — H02132 Senile ectropion of right lower eyelid: Secondary | ICD-10-CM | POA: Diagnosis not present

## 2021-03-01 DIAGNOSIS — H10503 Unspecified blepharoconjunctivitis, bilateral: Secondary | ICD-10-CM | POA: Diagnosis not present

## 2021-03-01 DIAGNOSIS — H02135 Senile ectropion of left lower eyelid: Secondary | ICD-10-CM | POA: Diagnosis not present

## 2021-03-02 ENCOUNTER — Ambulatory Visit (INDEPENDENT_AMBULATORY_CARE_PROVIDER_SITE_OTHER): Payer: Medicare Other

## 2021-03-02 VITALS — Ht 73.0 in | Wt 237.0 lb

## 2021-03-02 DIAGNOSIS — Z0000 Encounter for general adult medical examination without abnormal findings: Secondary | ICD-10-CM | POA: Diagnosis not present

## 2021-03-02 NOTE — Progress Notes (Addendum)
Subjective:   Lance Powell is a 88 y.o. male who presents for Medicare Annual/Subsequent preventive examination.  I connected with Lance Powell today by telephone and verified that I am speaking with the correct person using two identifiers. Location patient: home Location provider: work Persons participating in the virtual visit: patient, Marine scientist.    I discussed the limitations, risks, security and privacy concerns of performing an evaluation and management service by telephone and the availability of in person appointments. I also discussed with the patient that there may be a patient responsible charge related to this service. The patient expressed understanding and verbally consented to this telephonic visit.    Interactive audio and video telecommunications were attempted between this provider and patient, however failed, due to patient having technical difficulties OR patient did not have access to video capability.  We continued and completed visit with audio only.  Some vital signs may be absent or patient reported.   Time Spent with patient on telephone encounter: 3020inutes   Review of Systems     Cardiac Risk Factors include: advanced age (>33men, >52 women);male gender;dyslipidemia;diabetes mellitus;obesity (BMI >30kg/m2);sedentary lifestyle;hypertension     Objective:    Today's Vitals   03/02/21 1501  Weight: 237 lb (107.5 kg)  Height: 6\' 1"  (1.854 m)   Body mass index is 31.27 kg/m.  Advanced Directives 03/02/2021 09/28/2019 09/29/2018 09/14/2018 09/26/2017 09/19/2016 05/06/2016  Does Patient Have a Medical Advance Directive? Yes Yes Yes Yes Yes Yes Yes  Type of Paramedic of [place];Living will [place];Living will;Out of facility DNR (pink MOST or yellow form) [place];Living will Healthcare Power of [place];Living will [place];Living will -  Does patient want  to make changes to medical advance directive? - - No - Patient declined - - No - Patient declined No - Patient declined  Copy of [place] in Chart? Yes - validated most recent copy scanned in chart (See row information) - No - copy requested - Yes No - copy requested -  Pre-existing out of facility DNR order (yellow form or pink MOST form) - - - - - - -    Current Medications (verified) Outpatient Encounter Medications as of 03/02/2021  Medication Sig   albuterol (PROVENTIL) (2.5 MG/3ML) 0.083% nebulizer solution Take 3 mLs (2.5 mg total) by nebulization every 6 (six) hours as needed for wheezing or shortness of breath.   albuterol (VENTOLIN HFA) 108 (90 Base) MCG/ACT inhaler Inhale 2 puffs into the lungs every 6 (six) hours as needed for wheezing or shortness of breath.   aspirin 81 MG tablet Take 1 tablet (81 mg total) by mouth every morning. Stop for 1 week and then resume (Patient taking differently: Take 81 mg by mouth every morning.)   atorvastatin (LIPITOR) 40 MG tablet 1 tab po q day   celecoxib (CELEBREX) 200 MG capsule TAKE 1 CAPSULE(200 MG) BY MOUTH DAILY AS NEEDED FOR MODERATE PAIN   cholecalciferol (VITAMIN D) 1000 UNITS tablet Take 2,000 Units by mouth 2 (two) times daily.   fluticasone (FLONASE) 50 MCG/ACT nasal spray Place 1 spray into both nostrils daily.   glucose blood (BAYER CONTOUR TEST) test strip Use as instructed to check blood sugar twice a day.  DX  E11.9   Glycopyrrolate-Formoterol (BEVESPI AEROSPHERE) 9-4.8 MCG/ACT AERO Inhale 2 puffs into the lungs 2 (two) times daily.   guaiFENesin (MUCINEX) 600 MG 12 hr tablet Take 1 tablet (600  mg total) by mouth 2 (two) times daily.   meclizine (ANTIVERT) 12.5 MG tablet Take 1 tablet (12.5 mg total) by mouth 3 (three) times daily as needed for dizziness.   metFORMIN (GLUCOPHAGE) 500 MG tablet TAKE 1 TABLET(500 MG) BY MOUTH THREE TIMES DAILY   misoprostol (CYTOTEC) 100 MCG tablet TAKE 1 TABLET BY MOUTH TWICE  DAILY AND 2 TABLETS AT BEDTIME   Multiple Vitamins-Minerals (CENTRUM) tablet Take 1 tablet by mouth every morning. Reported on 01/05/2015   OVER THE COUNTER MEDICATION Place 1-2 drops into both eyes daily as needed (dry red eyes.). Reported on 01/05/2015   pioglitazone (ACTOS) 15 MG tablet Take 1 tablet (15 mg total) by mouth daily.   Spacer/Aero-Holding Chambers DEVI 1 Product by Does not apply route daily.   UNABLE TO FIND CPAP   venlafaxine XR (EFFEXOR-XR) 37.5 MG 24 hr capsule Take 1 capsule (37.5 mg total) by mouth daily with breakfast.   vitamin C (ASCORBIC ACID) 500 MG tablet Take 500 mg by mouth every morning.    No facility-administered encounter medications on file as of 03/02/2021.    Allergies (verified) Patient has no known allergies.   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   COPD (chronic obstructive pulmonary disease) ([place])    Depression 03/08/2014   Diabetes mellitus    GERD (gastroesophageal reflux disease) 03/08/2014   History of blood transfusion    Hyperlipemia    Hypertension    OSA (obstructive sleep apnea)    Osteoarthritis    Rheumatoid arthritis(714.0)    Sleep apnea    cpap   Past Surgical History:  Procedure Laterality Date   APPENDECTOMY  1969   CHOLECYSTECTOMY N/A 06/16/2014   Procedure: LAPAROSCOPIC CHOLECYSTECTOMY;  Surgeon: Greer Pickerel, MD;  Location: [facility];  Service: General;  Laterality: N/A;   CYSTOSCOPY  01/23/2011   Procedure: Consuela Mimes;  Surgeon: Molli Hazard, MD;  Location: [facility];  Service: Urology;  Laterality: N/A;   FLEXIBLE SIGMOIDOSCOPY  01/07/2012   Procedure: FLEXIBLE SIGMOIDOSCOPY;  Surgeon: Ladene Artist, MD,FACG;  Location: [facility];  Service: Endoscopy;  Laterality: N/A;   HERNIA REPAIR  1990   right and left inguinal   NISSEN FUNDOPLICATION     SIGMOIDECTOMY  2004   colovesical fistula   TONSILLECTOMY  1937   TRANSURETHRAL RESECTION OF PROSTATE   01/23/2011   Procedure: TRANSURETHRAL RESECTION OF THE PROSTATE WITH GYRUS INSTRUMENTS;  Surgeon: Molli Hazard, MD;  Location: [facility];  Service: Urology;  Laterality: N/A;   VASECTOMY  1972   Family History  Problem Relation Age of Onset   Heart disease Mother    Emphysema Brother    Cancer Daughter        breast   Social History   Socioeconomic History   Marital status: Widowed    Spouse name: Not on file   Number of children: 6   Years of education: Not on file   Highest education level: Not on file  Occupational History   Occupation: Retired    Comment: Chief Financial Officer  Tobacco Use   Smoking status: Former    Packs/day: 4.00    Years: 30.00    Pack years: 120.00    Types: Cigarettes    Quit date: 01/21/1978    Years since quitting: 43.1   Smokeless tobacco: Never  Vaping Use   Vaping Use: Never used  Substance and Sexual Activity   Alcohol use: No  Drug use: No   Sexual activity: Not on file  Other Topics Concern   Not on file  Social History Narrative   Right Handed   Lives in a one story home   Drinks no caffeine    Social Determinants of Health   Financial Resource Strain: Medium Risk   Difficulty of Paying Living Expenses: Somewhat hard  Food Insecurity: No Food Insecurity   Worried About Charity fundraiser in the Last Year: Never true   Ran Out of Food in the Last Year: Never true  Transportation Needs: No Transportation Needs   Lack of Transportation (Medical): No   Lack of Transportation (Non-Medical): No  Physical Activity: Inactive   Days of Exercise per Week: 0 days   Minutes of Exercise per Session: 0 min  Stress: No Stress Concern Present   Feeling of Stress : Not at all  Social Connections: Socially Isolated   Frequency of Communication with Friends and Family: More than three times a week   Frequency of Social Gatherings with Friends and Family: More than three times a week   Attends Religious Services: Never   Building surveyor or Organizations: No   Attends Archivist Meetings: Never   Marital Status: Widowed    Tobacco Counseling Counseling given: Not Answered   Clinical Intake:  Pre-visit preparation completed: Yes  Pain : No/denies pain     BMI - recorded: 31.27 Nutritional Status: BMI > 30  Obese Nutritional Risks: None Diabetes: Yes CBG done?: No Did pt. bring in CBG monitor from home?: No  How often do you need to have someone help you when you read instructions, pamphlets, or other written materials from your doctor or pharmacy?: 1 - Never  Diabetes:  Is the patient diabetic?  Yes  If diabetic, was a CBG obtained today?  No  Did the patient bring in their glucometer from home?  No phone visit How often do you monitor your CBG's? never.   Financial Strains and Diabetes Management:  Are you having any financial strains with the device, your supplies or your medication? No .  Does the patient want to be seen by Chronic Care Management for management of their diabetes?  No  Would the patient like to be referred to a Nutritionist or for Diabetic Management?  No   Diabetic Exams:  Diabetic Eye Exam: Completed 07/18/2020.   Diabetic Foot Exam: Completed 08/14/2020.    Interpreter Needed?: No  Information entered by :: Caroleen Hamman LPN   Activities of Daily Living In your present state of health, do you have any difficulty performing the following activities: 03/02/2021  Hearing? Y  Vision? N  Difficulty concentrating or making decisions? N  Walking or climbing stairs? N  Dressing or bathing? N  Doing errands, shopping? N  Preparing Food and eating ? N  Using the Toilet? N  In the past six months, have you accidently leaked urine? Y  Do you have problems with loss of bowel control? N  Managing your Medications? N  Managing your Finances? N  Housekeeping or managing your Housekeeping? N  Some recent data might be hidden    Patient Care  Team: [name], Iris Pert as PCP - General (Physician Assistant) Collene Gobble, MD as Consulting Physician (Pulmonary Disease) [name], MD as Consulting Physician (Ophthalmology) Cameron Sprang, MD as Consulting Physician (Neurology) Cherre Robins, [address] (Pharmacist)  Indicate any recent Medical Services you may have received from other than [facility]  providers in the past year (date may be approximate).     Assessment:   This is a routine wellness examination for Lance Powell.  Hearing/Vision screen Hearing Screening - Comments:: Has hearing aids but does not use them Vision Screening - Comments:: Last eye exam-03/01/2021-Dr. Gershon Crane  Dietary issues and exercise activities discussed: Current Exercise Habits: The patient does not participate in regular exercise at present   Goals Addressed             This Visit's Progress    DIET - [place]   Not on track    maintain healthy lifestyle.   On track      Depression Screen PHQ 2/9 Scores 03/02/2021 09/29/2018 09/26/2017 09/19/2016 05/24/2016 06/28/2015 03/27/2015  PHQ - 2 Score 0 0 0 0 0 0 0  PHQ- 9 Score - - - - - - -  Exception Documentation - - - - - Patient refusal -    Fall Risk Fall Risk  03/02/2021 02/21/2021 09/28/2019 09/29/2018 09/26/2017  Falls in the past year? 0 1 1 1  No  Number falls in past yr: 0 0 1 0 -  Injury with Fall? 0 0 1 1 -  Follow up Falls prevention discussed Falls evaluation completed - Education provided;Falls prevention discussed -    FALL RISK PREVENTION PERTAINING TO THE HOME:  Any stairs in or around the home? No  Home free of loose throw rugs in walkways, pet beds, electrical cords, etc? Yes  Adequate lighting in your home to reduce risk of falls? Yes   ASSISTIVE DEVICES UTILIZED TO PREVENT FALLS:  Life alert? No  Use of a cane, walker or w/c? Yes  walker Grab bars in the bathroom? Yes  Shower chair or bench in shower? Yes  Elevated toilet seat or a handicapped toilet? Yes   TIMED UP  AND GO:  Was the test performed? No . Phone visit   Cognitive Function:Normal cognitive status assessed by this Nurse Health Advisor. No abnormalities found.   MMSE - Mini Mental State Exam 09/26/2017 09/19/2016  Orientation to time 5 5  Orientation to Place 5 5  Registration 3 3  Attention/ Calculation 5 5  Recall 2 3  Language- name 2 objects 2 2  Language- repeat 1 1  Language- follow 3 step command 3 3  Language- read & follow direction 1 1  Write a sentence 1 1  Copy design 1 1  Total score 29 30     6CIT Screen 09/29/2018  What Year? 0 points  What month? 0 points  What time? 0 points  Count back from 20 0 points  Months in reverse 0 points  Repeat phrase 6 points  Total Score 6    Immunizations Immunization History  Administered Date(s) Administered   Fluad Quad(high Dose 65+) 10/16/2018, 11/03/2019, 10/11/2020   Influenza Split 11/11/2010, 10/22/2011   Influenza Whole 01/22/2008, 11/22/2009   Influenza, High Dose Seasonal PF 09/27/2015, 11/04/2016, 10/22/2017   Influenza, Seasonal, Injecte, Preservative Fre 10/09/2010   Influenza,inj,Quad PF,6+ Mos 10/19/2012, 11/03/2013, 11/10/2014   Influenza-Unspecified 11/21/2013   Moderna Sars-Covid-2 Vaccination 02/23/2019, 03/24/2019   Pfizer Covid-19 Vaccine Bivalent Booster 24yrs & up 10/11/2020   Pneumococcal Conjugate-13 02/15/2014, 03/27/2015   Pneumococcal Polysaccharide-23 10/31/2006, 01/22/2007   Tdap 03/27/2015    TDAP status: Up to date  Flu Vaccine status: Up to date  Pneumococcal vaccine status: Up to date  Covid-19 vaccine status: Completed vaccines  Qualifies for Shingles Vaccine? Yes   Zostavax completed No  Shingrix Completed?: No.    Education has been provided regarding the importance of this vaccine. Patient has been advised to call insurance company to determine out of pocket expense if they have not yet received this vaccine. Advised may also receive vaccine at local pharmacy or Health Dept.  Verbalized acceptance and understanding.  Screening Tests Health Maintenance  Topic Date Due   Zoster Vaccines- Shingrix (1 of 2) Never done   URINE MICROALBUMIN  09/13/2017   COVID-19 Vaccine (4 - Booster) 12/06/2020   OPHTHALMOLOGY EXAM  07/18/2021   FOOT EXAM  08/14/2021   HEMOGLOBIN A1C  08/21/2021   TETANUS/TDAP  03/26/2025   Pneumonia Vaccine 52+ Years old  Completed   INFLUENZA VACCINE  Completed   HPV VACCINES  Aged Out    Health Maintenance  Health Maintenance Due  Topic Date Due   Zoster Vaccines- Shingrix (1 of 2) Never done   URINE MICROALBUMIN  09/13/2017   COVID-19 Vaccine (4 - Booster) 12/06/2020    Colorectal cancer screening: No longer required.   Lung Cancer Screening: (Low Dose CT Chest recommended if Age 70-80 years, 30 pack-year currently smoking OR have quit w/in 15years.) does not qualify.     Additional Screening:  Hepatitis C Screening: does not qualify  Vision Screening: Recommended annual ophthalmology exams for early detection of glaucoma and other disorders of the eye. Is the patient up to date with their annual eye exam?  Yes  Who is the provider or what is the name of the office in which the patient attends annual eye exams? Dr. Gershon Crane   Dental Screening: Recommended annual dental exams for proper oral hygiene  Community Resource Referral / Chronic Care Management: CRR required this visit?  No   CCM required this visit?  No      Plan:     I have personally reviewed and noted the following in the patients chart:   Medical and social history Use of alcohol, tobacco or illicit drugs  Current medications and supplements including opioid prescriptions. Patient is not currently taking opioid prescriptions. Functional ability and status Nutritional status Physical activity Advanced directives List of other physicians Hospitalizations, surgeries, and ER visits in previous 12 months Vitals Screenings to include cognitive,  depression, and falls Referrals and appointments  In addition, I have reviewed and discussed with patient certain preventive protocols, quality metrics, and best practice recommendations. A written personalized care plan for preventive services as well as general preventive health recommendations were provided to patient.   Due to this being a telephonic visit, the after visit summary with patients personalized plan was offered to patient via mail or my-chart. Per request, patient was mailed a copy of [place], LPN   [id number]  Nurse Health Advisor  Nurse Notes: None  Review and Agree with assessment & plan of LPN  [name], PA-C

## 2021-03-02 NOTE — Patient Instructions (Signed)
Lance Powell , Thank you for taking time to complete your Medicare Wellness Visit. I appreciate your ongoing commitment to your health goals. Please review the following plan we discussed and let me know if I can assist you in the future.   Screening recommendations/referrals: Colonoscopy: No longer required Recommended yearly ophthalmology/optometry visit for glaucoma screening and checkup Recommended yearly dental visit for hygiene and checkup  Vaccinations: Influenza vaccine: Up to date Pneumococcal vaccine: Up to date Tdap vaccine: Up to date Shingles vaccine: May obtain vaccine at your local pharmacy. Covid-19: Up to date  Advanced directives: Copy in chart  Conditions/risks identified: See problem list  Next appointment: Follow up in one year for your annual wellness visit.   Preventive Care 86 Years and Older, Male Preventive care refers to lifestyle choices and visits with your health care provider that can promote health and wellness. What does preventive care include? A yearly physical exam. This is also called an annual well check. Dental exams once or twice a year. Routine eye exams. Ask your health care provider how often you should have your eyes checked. Personal lifestyle choices, including: Daily care of your teeth and gums. Regular physical activity. Eating a healthy diet. Avoiding tobacco and drug use. Limiting alcohol use. Practicing safe sex. Taking low doses of aspirin every day. Taking vitamin and mineral supplements as recommended by your health care provider. What happens during an annual well check? The services and screenings done by your health care provider during your annual well check will depend on your age, overall health, lifestyle risk factors, and family history of disease. Counseling  Your health care provider may ask you questions about your: Alcohol use. Tobacco use. Drug use. Emotional well-being. Home and relationship  well-being. Sexual activity. Eating habits. History of falls. Memory and ability to understand (cognition). Work and work Statistician. Screening  You may have the following tests or measurements: Height, weight, and BMI. Blood pressure. Lipid and cholesterol levels. These may be checked every 5 years, or more frequently if you are over 19 years old. Skin check. Lung cancer screening. You may have this screening every year starting at age 42 if you have a 30-pack-year history of smoking and currently smoke or have quit within the past 15 years. Fecal occult blood test (FOBT) of the stool. You may have this test every year starting at age 80. Flexible sigmoidoscopy or colonoscopy. You may have a sigmoidoscopy every 5 years or a colonoscopy every 10 years starting at age 15. Prostate cancer screening. Recommendations will vary depending on your family history and other risks. Hepatitis C blood test. Hepatitis B blood test. Sexually transmitted disease (STD) testing. Diabetes screening. This is done by checking your blood sugar (glucose) after you have not eaten for a while (fasting). You may have this done every 1-3 years. Abdominal aortic aneurysm (AAA) screening. You may need this if you are a current or former smoker. Osteoporosis. You may be screened starting at age 78 if you are at high risk. Talk with your health care provider about your test results, treatment options, and if necessary, the need for more tests. Vaccines  Your health care provider may recommend certain vaccines, such as: Influenza vaccine. This is recommended every year. Tetanus, diphtheria, and acellular pertussis (Tdap, Td) vaccine. You may need a Td booster every 10 years. Zoster vaccine. You may need this after age 69. Pneumococcal 13-valent conjugate (PCV13) vaccine. One dose is recommended after age 62. Pneumococcal polysaccharide (PPSV23) vaccine. One dose  is recommended after age 83. Talk to your health care  provider about which screenings and vaccines you need and how often you need them. This information is not intended to replace advice given to you by your health care provider. Make sure you discuss any questions you have with your health care provider. Document Released: 02/03/2015 Document Revised: 09/27/2015 Document Reviewed: 11/08/2014 Elsevier Interactive Patient Education  2017 [place] Prevention in the Home Falls can cause injuries. They can happen to people of all ages. There are many things you can do to make your home safe and to help prevent falls. What can I do on the outside of my home? Regularly fix the edges of walkways and driveways and fix any cracks. Remove anything that might make you trip as you walk through a door, such as a raised step or threshold. Trim any bushes or trees on the path to your home. Use bright outdoor lighting. Clear any walking paths of anything that might make someone trip, such as rocks or tools. Regularly check to see if handrails are loose or broken. Make sure that both sides of any steps have handrails. Any raised decks and porches should have guardrails on the edges. Have any leaves, snow, or ice cleared regularly. Use sand or salt on walking paths during winter. Clean up any spills in your garage right away. This includes oil or grease spills. What can I do in the bathroom? Use night lights. Install grab bars by the toilet and in the tub and shower. Do not use towel bars as grab bars. Use non-skid mats or decals in the tub or shower. If you need to sit down in the shower, use a plastic, non-slip stool. Keep the floor dry. Clean up any water that spills on the floor as soon as it happens. Remove soap buildup in the tub or shower regularly. Attach bath mats securely with double-sided non-slip rug tape. Do not have throw rugs and other things on the floor that can make you trip. What can I do in the bedroom? Use night lights. Make  sure that you have a light by your bed that is easy to reach. Do not use any sheets or blankets that are too big for your bed. They should not hang down onto the floor. Have a firm chair that has side arms. You can use this for support while you get dressed. Do not have throw rugs and other things on the floor that can make you trip. What can I do in the kitchen? Clean up any spills right away. Avoid walking on wet floors. Keep items that you use a lot in easy-to-reach places. If you need to reach something above you, use a strong step stool that has a grab bar. Keep electrical cords out of the way. Do not use floor polish or wax that makes floors slippery. If you must use wax, use non-skid floor wax. Do not have throw rugs and other things on the floor that can make you trip. What can I do with my stairs? Do not leave any items on the stairs. Make sure that there are handrails on both sides of the stairs and use them. Fix handrails that are broken or loose. Make sure that handrails are as long as the stairways. Check any carpeting to make sure that it is firmly attached to the stairs. Fix any carpet that is loose or worn. Avoid having throw rugs at the top or bottom of the stairs.  If you do have throw rugs, attach them to the floor with carpet tape. Make sure that you have a light switch at the top of the stairs and the bottom of the stairs. If you do not have them, ask someone to add them for you. What else can I do to help prevent falls? Wear shoes that: Do not have high heels. Have rubber bottoms. Are comfortable and fit you well. Are closed at the toe. Do not wear sandals. If you use a stepladder: Make sure that it is fully opened. Do not climb a closed stepladder. Make sure that both sides of the stepladder are locked into place. Ask someone to hold it for you, if possible. Clearly mark and make sure that you can see: Any grab bars or handrails. First and last steps. Where the  edge of each step is. Use tools that help you move around (mobility aids) if they are needed. These include: Canes. Walkers. Scooters. Crutches. Turn on the lights when you go into a dark area. Replace any light bulbs as soon as they burn out. Set up your furniture so you have a clear path. Avoid moving your furniture around. If any of your floors are uneven, fix them. If there are any pets around you, be aware of where they are. Review your medicines with your doctor. Some medicines can make you feel dizzy. This can increase your chance of falling. Ask your doctor what other things that you can do to help prevent falls. This information is not intended to replace advice given to you by your health care provider. Make sure you discuss any questions you have with your health care provider. Document Released: 11/03/2008 Document Revised: 06/15/2015 Document Reviewed: 02/11/2014 Elsevier Interactive Patient Education  2017 [employer].

## 2021-03-05 ENCOUNTER — Other Ambulatory Visit (HOSPITAL_BASED_OUTPATIENT_CLINIC_OR_DEPARTMENT_OTHER): Payer: Self-pay

## 2021-03-05 MED ORDER — SHINGRIX 50 MCG/0.5ML IM SUSR
INTRAMUSCULAR | 1 refills | Status: DC
  Filled 2021-03-05: qty 1, 1d supply, fill #0

## 2021-03-12 ENCOUNTER — Other Ambulatory Visit: Payer: Self-pay | Admitting: Medical

## 2021-03-14 ENCOUNTER — Other Ambulatory Visit: Payer: Self-pay

## 2021-03-14 ENCOUNTER — Ambulatory Visit: Payer: Medicare Other | Admitting: Emergency Medicine

## 2021-03-14 ENCOUNTER — Encounter: Payer: Self-pay | Admitting: Emergency Medicine

## 2021-03-14 VITALS — BP 118/68 | HR 102 | Temp 98.2°F | Ht 72.0 in | Wt 239.8 lb

## 2021-03-14 DIAGNOSIS — J438 Other emphysema: Secondary | ICD-10-CM

## 2021-03-14 DIAGNOSIS — F0393 Unspecified dementia, unspecified severity, with mood disturbance: Secondary | ICD-10-CM | POA: Diagnosis not present

## 2021-03-14 DIAGNOSIS — F0390 Unspecified dementia without behavioral disturbance: Secondary | ICD-10-CM | POA: Insufficient documentation

## 2021-03-14 DIAGNOSIS — J9611 Chronic respiratory failure with hypoxia: Secondary | ICD-10-CM | POA: Diagnosis not present

## 2021-03-14 DIAGNOSIS — G4733 Obstructive sleep apnea (adult) (pediatric): Secondary | ICD-10-CM

## 2021-03-14 NOTE — Assessment & Plan Note (Signed)
Your maintenance medicine at this time is Stiolto.  You should use 2 puffs once daily.  Take this medication every day on a schedule.  Do not skip days. We will investigate whether we can possibly change your Stiolto to an alternative maintenance medication called Bevespi.  We will decide this based on cost. You should keep albuterol (Ventolin) available to use 2 puffs up to every 4 hours if you need it for shortness of breath, chest tightness, wheezing. Follow with APP in 1 month to review your medicines Follow with Dr. Lamonte Sakai in 6 months or sooner if you have any problems.

## 2021-03-14 NOTE — Patient Instructions (Signed)
You need to use your oxygen at all times.  It needs to be set on 2 L/min when you are at rest and increased to 4 L/min when you are up exerting. Wear your CPAP every night with oxygen bled in. In the morning when you get up be sure to disconnect your oxygen from the CPAP and attach it to your nasal cannula so you can begin your day. Your maintenance medicine at this time is Stiolto.  You should use 2 puffs once daily.  Take this medication every day on a schedule.  Do not skip days. We will investigate whether we can possibly change your Stiolto to an alternative maintenance medication called Bevespi.  We will decide this based on cost. You should keep albuterol (Ventolin) available to use 2 puffs up to every 4 hours if you need it for shortness of breath, chest tightness, wheezing. Follow with APP in 1 month to review your medicines Follow with Dr. Lamonte Sakai in 6 months or sooner if you have any problems.

## 2021-03-14 NOTE — Assessment & Plan Note (Signed)
His daughter [name] is present today.  She has significant concerns about her father's safety at home.  He forgets to wear his oxygen, does not take his medications reliably.  He is currently living by himself.  Further he exhibits some impulsive, angry behavior that I suspect stems from some evolving memory loss, dementia.  He is quite controlling, wants to manage his own affairs, is resistant to any advice or correction even regarding trivial issues. She indicates that [name] would be very resistant to being in assisted living or SNF.  That being said it sounds like his current situation is unsafe.  She is looking for assistance regarding his overall care.  It sounds like he probably needs neuropsych testing and an evaluation of his ability to carry out his ADLs, be safe at home. I will try to make a neuropsych referral, and will also forward this information to patient's PCP, [name].

## 2021-03-14 NOTE — Assessment & Plan Note (Signed)
Wear your CPAP every night with oxygen bled in. In the morning when you get up be sure to disconnect your oxygen from the CPAP and attach it to your nasal cannula so you can begin your day.

## 2021-03-14 NOTE — Progress Notes (Signed)
Subjective:    Patient ID: Lance Powell, male    DOB: 27-Apr-1929, 88 y.o.   MRN: [id number]  HPI  ROV 11/02/20 --88 year old man with severe COPD, OSA on CPAP and chronic hypoxemic respiratory failure.  He desaturated on ambulation into the room on 2 L/min pulsed flow today.  He is managed on Stiolto - but doesn't take it reliably.  Uses guaifenesin.  He has albuterol and uses about once a month.  He is wearing his CPAP, has very good compliance. He is benefiting, gets a good night's sleep. Daytime sleepiness and breathing are better.  He is quite limited, spends a lot of time resting - in chair or in bed. Clears mucous about 1-2x a day.  His daughter is here with him and notes that his delivery of the Stiolto was probably impaired and that he does better with medications that he can use with a spacer  ROV 03/14/21 --follow-up visit for 88 year old gentleman with a history of chronic hypoxemic respiratory failure in the setting of severe COPD, OSA on CPAP. PMH: Diabetes, hyperlipidemia, hypertension, GERD, rheumatoid arthritis, allergic rhinitis We had planned to change his Stiolto to [place] at his last visit in October because he gets better delivery with a spacer.  He did not get the bevespi, apparently due to cost. He has stiolto, is not using it reliably (barely at all). I think this is due to memory and confusion about the meds.  There was a prior authorization for bevespi that was accepted and confirmed 11/09/2020 from [employer] > we will check on the cost.  Also titrated his oxygen to 2 L/min at rest and 4 L/min with exertion.  He is compliant with his CPAP + O2 He believes that his breathing is ok when he is around the house - he is very sedentary.  He has cough at night, productive of gray to yellow.    No flowsheet data found.  Review of Systems As per HPI     Objective:   Physical Exam Vitals:   03/14/21 1647  BP: 118/68  Pulse: (!) 102  Temp: 98.2 F (36.8 C)   TempSrc: Oral  SpO2: 92%  Weight: 239 lb 12.8 oz (108.8 kg)  Height: 6' (1.829 m)    Gen: Pleasant, overwt man, in no distress,  normal affect  ENT: No lesions,  mouth clear,  oropharynx clear, no postnasal drip  Neck: No JVD, no stridor  Lungs: No use of accessory muscles, very distant, clear on a normal breath, no wheezing  Cardiovascular: RRR, heart sounds normal, no murmur or gallops, no peripheral edema  Musculoskeletal: No deformities  Neuro: alert, awake, poor memory. Re-orients easily  Skin: Warm, no lesions or rashes      Assessment & Plan:  Obstructive sleep apnea Wear your CPAP every night with oxygen bled in. In the morning when you get up be sure to disconnect your oxygen from the CPAP and attach it to your nasal cannula so you can begin your day.   COPD (chronic obstructive pulmonary disease) ([place]) Your maintenance medicine at this time is Stiolto.  You should use 2 puffs once daily.  Take this medication every day on a schedule.  Do not skip days. We will investigate whether we can possibly change your Stiolto to an alternative maintenance medication called Bevespi.  We will decide this based on cost. You should keep albuterol (Ventolin) available to use 2 puffs up to every 4 hours if you need it for shortness  of breath, chest tightness, wheezing. Follow with APP in 1 month to review your medicines Follow with Dr. Lamonte Sakai in 6 months or sooner if you have any problems.  Chronic respiratory failure ([place]) You need to use your oxygen at all times.  It needs to be set on 2 L/min when you are at rest and increased to 4 L/min when you are up exerting. Wear your CPAP every night with oxygen bled in. In the morning when you get up be sure to disconnect your oxygen from the CPAP and attach it to your nasal cannula so you can begin your day.  [facility]) His daughter Lance Powell is present today.  She has significant concerns about her father's safety at home.  He  forgets to wear his oxygen, does not take his medications reliably.  He is currently living by himself.  Further he exhibits some impulsive, angry behavior that I suspect stems from some evolving memory loss, dementia.  He is quite controlling, wants to manage his own affairs, is resistant to any advice or correction even regarding trivial issues. She indicates that Lance Powell would be very resistant to being in assisted living or SNF.  That being said it sounds like his current situation is unsafe.  She is looking for assistance regarding his overall care.  It sounds like he probably needs neuropsych testing and an evaluation of his ability to carry out his ADLs, be safe at home. I will try to make a neuropsych referral, and will also forward this information to patient's PCP, [name].   Time spent 60 minutes  [name], MD, PhD 03/14/2021, 5:36 PM [facility] [phone] or if no answer [phone]

## 2021-03-14 NOTE — Assessment & Plan Note (Signed)
You need to use your oxygen at all times.  It needs to be set on 2 L/min when you are at rest and increased to 4 L/min when you are up exerting. Wear your CPAP every night with oxygen bled in. In the morning when you get up be sure to disconnect your oxygen from the CPAP and attach it to your nasal cannula so you can begin your day.

## 2021-03-16 ENCOUNTER — Telehealth: Payer: Self-pay

## 2021-03-16 ENCOUNTER — Other Ambulatory Visit (HOSPITAL_COMMUNITY): Payer: Self-pay

## 2021-03-16 NOTE — Telephone Encounter (Signed)
Pharmacy will you please run test claims on Bevespi and [place]? Thank you

## 2021-03-16 NOTE — Telephone Encounter (Signed)
Pt's daughter came into office this morning and asked to speak with me. She stated that while she is very appreciative of Dr. Lamonte Sakai helping with trying to get [facility] established at this time, they as a family do not want to good that route at this time. Apparently this is different then what was said during [place] on 03/14/21.  Routing to Dr. Lamonte Sakai as Juluis Rainier.

## 2021-03-19 ENCOUNTER — Other Ambulatory Visit (HOSPITAL_COMMUNITY): Payer: Self-pay

## 2021-03-19 NOTE — Telephone Encounter (Signed)
Thank you - will try to help them when / if they need [place] to

## 2021-03-20 NOTE — Telephone Encounter (Signed)
Thank you. Dr. Lamonte Sakai please advise.

## 2021-03-21 NOTE — Telephone Encounter (Signed)
I believe the Lance Powell would be preferred if he and daughters agree > this would allow him to use a spacer which I believe he needs. Hopefully this will be better covered once he meets his deductible.  ?

## 2021-04-04 ENCOUNTER — Ambulatory Visit: Payer: Medicare Other | Admitting: Nurse Practitioner

## 2021-04-04 ENCOUNTER — Other Ambulatory Visit: Payer: Self-pay

## 2021-04-04 ENCOUNTER — Encounter: Payer: Self-pay | Admitting: Nurse Practitioner

## 2021-04-04 VITALS — BP 122/64 | HR 99 | Temp 98.1°F | Ht 72.0 in | Wt 241.4 lb

## 2021-04-04 DIAGNOSIS — J9611 Chronic respiratory failure with hypoxia: Secondary | ICD-10-CM

## 2021-04-04 DIAGNOSIS — J438 Other emphysema: Secondary | ICD-10-CM

## 2021-04-04 DIAGNOSIS — F0393 Unspecified dementia, unspecified severity, with mood disturbance: Secondary | ICD-10-CM

## 2021-04-04 DIAGNOSIS — I10 Essential (primary) hypertension: Secondary | ICD-10-CM | POA: Diagnosis not present

## 2021-04-04 DIAGNOSIS — G4733 Obstructive sleep apnea (adult) (pediatric): Secondary | ICD-10-CM | POA: Diagnosis not present

## 2021-04-04 MED ORDER — STIOLTO RESPIMAT 2.5-2.5 MCG/ACT IN AERS: 2.0000 | INHALATION_SPRAY | Freq: Every day | RESPIRATORY_TRACT | 0 refills | Status: DC

## 2021-04-04 MED ORDER — STIOLTO RESPIMAT 2.5-2.5 MCG/ACT IN AERS: 2.0000 | INHALATION_SPRAY | Freq: Every day | RESPIRATORY_TRACT | 5 refills | Status: DC

## 2021-04-04 NOTE — Assessment & Plan Note (Signed)
Family canceled neuropsych referral.  Advised to notify or discuss with PCP if they would like any further interventions. ?

## 2021-04-04 NOTE — Progress Notes (Signed)
? ?'@Patient'$  ID: Lance Powell, male    DOB: [birth year], 88 y.o.   MRN: [id number] ? ?Chief Complaint  ?Patient presents with  ? Follow-up  ?  Pt states he has been doing good since last visit and denies any complaints.  ? ? ?Referring provider: ?[name], [name], PA-C ? ?HPI: ?88 year old male, former smoker 120 pack years) followed for COPD with emphysema, chronic respiratory failure and OSA.  He is a patient of Dr. Agustina Caroli and last seen in office on 03/14/2021.  Past medical history significant for dementia, hypertension, allergic rhinitis, diverticulitis, GERD, DM2, OA, HLD, RA, depression, ED. ? ?TEST/EVENTS:  ? ?03/14/2021: OV with Dr. Lamonte Sakai.  Daughter accompanied patient.  Previously managed on Stiolto but reported that he is not taking it reliably.  Feels like he is still having some shortness of breath and persistent cough.  Discussed changing from Stiolto to [place] and use with spacer.  Dependent on cost of medications.  Continue supplemental oxygen therapy at 2 L/min at rest and 4 L/min with exertion.  Continued CPAP at night.  Daughter voiced concerns about father safety at home.  Discussed that he forgets to use his oxygen and does not take his medications reliably.  He does have some impulsive angry behaviors.  Was referred to neuropsych.  Daughter called back and requested that this was canceled as her father and family did not wish to proceed any further with this. ? ?04/04/2021: Today-follow-up ?Patient presents today with daughter for follow-up.  He has been very consistent with Stiolto use and has been taking it at 7 PM every night.  He has only been doing 1 puff daily but feels as though his breathing is better and his cough has significantly improved.  It was initially more productive with yellow sputum but this has cleared and he has a minimal, primarily nonproductive cough.  They report that the Bevespi was significantly more expensive than the Stiolto was.  He pays $100 a month for Stiolto  which is affordable to them.  They do not wish to switch to the Bevespi at this time, especially since he feels like it is not working since he has increased his compliance.  He states that he has been wearing his oxygen as he is supposed to.  He also states that he wears his CPAP every night without difficulties.  He does continue to live relatively sedentary lifestyle and spends a lot of time resting in his chair. ? ?[facility]view download: CPAP 11 cmH2O ?30 out of 30 days used (97% greater than 4 hours), average usage 13 hours 22 minutes ?Leaks median 60.5, 95th 119.6 ?AHI 13.3 ? ?No Known Allergies ? ?Immunization History  ?Administered Date(s) Administered  ? Fluad Quad(high Dose 65+) 10/16/2018, 11/03/2019, 10/11/2020  ? Influenza Split 11/11/2010, 10/22/2011  ? Influenza Whole 01/22/2008, 11/22/2009  ? Influenza, High Dose Seasonal PF 09/27/2015, 11/04/2016, 10/22/2017  ? Influenza, Seasonal, Injecte, Preservative Fre 10/09/2010  ? Influenza,inj,Quad PF,6+ Mos 10/19/2012, 11/03/2013, 11/10/2014  ? Influenza-Unspecified 11/21/2013  ? Moderna Sars-Covid-2 Vaccination 02/23/2019, 03/24/2019  ? Pension scheme manager 77yr & up 10/11/2020  ? Pneumococcal Conjugate-13 02/15/2014, 03/27/2015  ? Pneumococcal Polysaccharide-23 10/31/2006, 01/22/2007  ? Tdap 03/27/2015  ? Zoster Recombinat (Shingrix) 03/05/2021  ? ? ?Past Medical History:  ?Diagnosis Date  ? 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 [place]ch[place] obstructive [place] disease[place]   ? Depression 03/08/2014  ? Diabetes mellitus   ? GERD (gastroesophageal reflux disease) 03/08/2014  ? History of blood transfusion   ?  Hyperlipemia   ? Hypertension   ? OSA (obstructive sleep apnea)   ? Osteoarthritis   ? Rheumatoid arthritis(714.0)   ? Sleep apnea   ? cpap  ? ? ?Tobacco History: ?Social History  ? ?Tobacco Use  ?Smoking Status Former  ? Packs/day: 4.00  ? Years: 30.00  ? Pack years: 120.00  ? Types: Cigarettes  ? Quit date: 01/21/1978  ? Years since  quitting: 43.2  ?Smokeless Tobacco Never  ? ?Counseling given: Not Answered ? ? ?Outpatient Medications Prior to Visit  ?Medication Sig Dispense Refill  ? albuterol (PROVENTIL) (2.5 MG/3ML) 0.083% nebulizer solution Take 3 mLs (2.5 mg total) by nebulization every 6 (six) hours as needed for wheezing or shortness of breath. 150 mL 1  ? albuterol (VENTOLIN HFA) 108 (90 Base) MCG/ACT inhaler Inhale 2 puffs into the lungs every 6 (six) hours as needed for wheezing or shortness of breath. 8 g 2  ? aspirin 81 MG tablet Take 1 tablet (81 mg total) by mouth every morning. Stop for 1 week and then resume (Patient taking differently: Take 81 mg by mouth every morning.)    ? atorvastatin (LIPITOR) 40 MG tablet 1 tab po q day 90 tablet 3  ? celecoxib (CELEBREX) 200 MG capsule TAKE 1 CAPSULE(200 MG) BY MOUTH DAILY AS NEEDED FOR MODERATE PAIN 30 capsule 1  ? cholecalciferol (VITAMIN D) 1000 UNITS tablet Take 2,000 Units by mouth 2 (two) times daily.    ? fluticasone (FLONASE) 50 MCG/ACT nasal spray Place 1 spray into both nostrils daily. 16 g 2  ? glucose blood (BAY[place] CONTOUR TEST) test strip Use as instructed to check blood sugar twice a day.  DX  E11.9 100 each 5  ? guaiFENesin (MUCINEX) 600 MG 12 hr tablet Take 1 tablet (600 mg total) by mouth 2 (two) times daily. 60 tablet 2  ? meclizine (ANTIVERT) 12.5 MG tablet Take 1 tablet (12.5 mg total) by mouth 3 (three) times daily as needed for dizziness. 30 tablet 0  ? metFORMIN (GLUCOPHAGE) 500 MG tablet TAKE 1 TABLET(500 MG) BY MOUTH THREE TIMES DAILY 90 tablet 3  ? misoprostol (CYTOTEC) 100 MCG tablet TAKE 1 TABLET BY MOUTH TWICE DAILY AND 2 TABLETS AT BEDTIME 360 tablet 3  ? Multiple Vitamins-Minerals (CENTRUM) tablet Take 1 tablet by mouth every morning. Reported on 01/05/2015    ? OVER THE COUNTER MEDICATION Place 1-2 drops into both eyes daily as needed (dry red eyes.). Reported on 01/05/2015    ? pioglitazone (ACTOS) 15 MG tablet Take 1 tablet (15 mg total) by mouth daily.  90 tablet 3  ? Spacer/Aero-Holding Chambers DEVI 1 Product by Does not apply route daily. 1 Product 0  ? UNABLE TO FIND CPAP    ? venlafaxine XR (EFFEXOR-XR) 37.5 MG 24 hr capsule Take 1 capsule (37.5 mg total) by mouth daily with breakfast. 90 capsule 3  ? vitamin C (ASCORBIC ACID) 500 MG tablet Take 500 mg by mouth every morning.     ? Zoster Vaccine Adjuvanted [facility]) injection Inject into the muscle. 1 each 1  ? Tiotropium Bromide-Olodaterol (STIOLTO RESPIMAT) 2.5-2.5 MCG/ACT AERS Inhale 2 puffs into the lungs daily.    ? Glycopyrrolate-Formoterol (BEVESPI AEROSPHERE) 9-4.8 MCG/ACT AERO Inhale 2 puffs into the lungs 2 (two) times daily. 1 each 5  ? ?No facility-administered medications prior to visit.  ? ? ? ?Review of Systems:  ? ?Constitutional: No weight loss or gain, night sweats, fevers, chills, fatigue, or lassitude. ?HEENT: No headaches, difficulty  swallowing, tooth/dental problems, or sore throat. No sneezing, itching, ear ache, nasal congestion, or post nasal drip ?CV:  No chest pain, orthopnea, PND, swelling in lower extremities, anasarca, dizziness, palpitations, syncope ?Resp: +shortness of breath with exertion (minimal; significantly improved); cough (mostly non-productive now; improved). No excess mucus or change in color of mucus.  No hemoptysis. No wheezing.  No chest wall deformity ?Neuro: No dizziness or lightheadedness.  ?Psych: No depression or anxiety. Mood stable.  ? ? ? ?Physical Exam: ? ?BP 122/64 (BP Location: Right Arm, Patient Position: Sitting, Cuff Size: Normal)   Pulse 99   Temp 98.1 ?F (36.7 ?C) (Oral)   Ht 6' (1.829 m)   Wt 241 lb 6.4 oz (109.5 kg)   SpO2 93% Comment: 3L pulse  BMI 32.74 kg/m?  ? ?GEN: Pleasant, interactive, chronically-ill appearing; elderly; obese; in no acute distress. ?HEENT:  Normocephalic and atraumatic. PERRLA. Sclera white. Nasal turbinates pink, moist and patent bilaterally. No rhinorrhea present. Oropharynx pink and moist, without exudate or  edema. No lesions, ulcerations, or postnasal drip.  ?NECK:  Supple w/ fair ROM. No JVD present. Normal carotid impulses w/o bruits. Thyroid symmetrical with no goiter or nodules palpated. No lymphadenopathy.   ?CV: RRR,

## 2021-04-04 NOTE — Assessment & Plan Note (Signed)
Well-controlled.  Follow-up with PCP as scheduled. ?

## 2021-04-04 NOTE — Assessment & Plan Note (Signed)
Maintained on Stiolto.  Has had improvement in symptoms with consistency of use.  Uses nightly at 7 PM.  Advised that he increase to 2 puffs once a day from 1 puff.  Sample provided and refill sent to pharmacy. ? ?Patient Instructions  ?Continue Stiolto 2 puffs daily  ?Continue Albuterol inhaler 2 puffs or 3 mL neb every 6 hours as needed for shortness of breath or wheezing. Notify if symptoms persist despite rescue inhaler/neb use. ?Continue flonase 2 sprays each nostril daily ?Continue mucinex 600 mg Twice daily  ?Continue supplemental oxygen 2 lpm at rest and at night and 4 lpm with activity. Goal oxygen >88-90%  ? ?Continue CPAP therapy nightly. Mask fitting at [facility]  ? ?Follow up with Dr. Lamonte Sakai in 3 months. If symptoms do not improve or worsen, please contact office for sooner follow up or seek emergency care. ? ? ?

## 2021-04-04 NOTE — Assessment & Plan Note (Signed)
Great compliance.  Significant leaks with breakthrough OSA.  Advised mask fitting to ensure proper fit.  Continue to bleed oxygen through at 2 L/min.  Ensure that he is changing out equipment every 30 days to avoid leaks.  Will reevaluate at follow-up. ?

## 2021-04-04 NOTE — Patient Instructions (Addendum)
Continue Stiolto 2 puffs daily  ?Continue Albuterol inhaler 2 puffs or 3 mL neb every 6 hours as needed for shortness of breath or wheezing. Notify if symptoms persist despite rescue inhaler/neb use. ?Continue flonase 2 sprays each nostril daily ?Continue mucinex 600 mg Twice daily  ?Continue supplemental oxygen 2 lpm at rest and at night and 4 lpm with activity. Goal oxygen >88-90%  ? ?Continue CPAP therapy nightly. Mask fitting at [facility]  ? ?Follow up with Dr. Lamonte Sakai in 3 months. If symptoms do not improve or worsen, please contact office for sooner follow up or seek emergency care. ?

## 2021-04-04 NOTE — Assessment & Plan Note (Signed)
Daughter and patient report consistent use of oxygen therapy.  He uses 2 L/min while at rest and 3 to 4 L/min with activity.  Was on 3 L/min POC at office visit and sats were 93%.  Goal SPO2 greater than 88 to 90% ?

## 2021-04-09 MED ORDER — BEVESPI AEROSPHERE 9-4.8 MCG/ACT IN AERO: 2.0000 | INHALATION_SPRAY | Freq: Two times a day (BID) | RESPIRATORY_TRACT | 6 refills | Status: DC

## 2021-04-09 NOTE — Telephone Encounter (Signed)
Spoke with pt's daughter and relayed all information to her. Nothing further needed at this time.  ?

## 2021-04-09 NOTE — Telephone Encounter (Signed)
Spoke with pt's daughter [name] who stated pt would like Bevespi called in. Called [facility] to confirm inhaler would be ready for pick up  ?

## 2021-04-13 ENCOUNTER — Other Ambulatory Visit: Payer: Self-pay | Admitting: Medical

## 2021-04-19 DIAGNOSIS — J449 Chronic obstructive pulmonary disease, unspecified: Secondary | ICD-10-CM | POA: Diagnosis not present

## 2021-04-19 DIAGNOSIS — G4733 Obstructive sleep apnea (adult) (pediatric): Secondary | ICD-10-CM | POA: Diagnosis not present

## 2021-04-19 DIAGNOSIS — J9610 Chronic respiratory failure, unspecified whether with hypoxia or hypercapnia: Secondary | ICD-10-CM | POA: Diagnosis not present

## 2021-04-21 ENCOUNTER — Other Ambulatory Visit: Payer: Self-pay | Admitting: Medical

## 2021-04-24 DIAGNOSIS — H02112 Cicatricial ectropion of right lower eyelid: Secondary | ICD-10-CM | POA: Diagnosis not present

## 2021-04-24 DIAGNOSIS — H02132 Senile ectropion of right lower eyelid: Secondary | ICD-10-CM | POA: Diagnosis not present

## 2021-04-24 DIAGNOSIS — H02115 Cicatricial ectropion of left lower eyelid: Secondary | ICD-10-CM | POA: Diagnosis not present

## 2021-04-24 DIAGNOSIS — H02135 Senile ectropion of left lower eyelid: Secondary | ICD-10-CM | POA: Diagnosis not present

## 2021-05-06 ENCOUNTER — Emergency Department (HOSPITAL_COMMUNITY)
Admission: EM | Admit: 2021-05-06 | Discharge: 2021-05-07 | Disposition: A | Discharge status: Home / Self Care | Payer: Medicare Other | Attending: Emergency Medicine | Admitting: Emergency Medicine

## 2021-05-06 ENCOUNTER — Emergency Department (HOSPITAL_COMMUNITY): Payer: Medicare Other

## 2021-05-06 ENCOUNTER — Encounter (HOSPITAL_COMMUNITY): Payer: Self-pay | Admitting: Emergency Medicine

## 2021-05-06 DIAGNOSIS — E119 Type 2 diabetes mellitus without complications: Secondary | ICD-10-CM | POA: Insufficient documentation

## 2021-05-06 DIAGNOSIS — J9811 Atelectasis: Secondary | ICD-10-CM | POA: Diagnosis not present

## 2021-05-06 DIAGNOSIS — Z043 Encounter for examination and observation following other accident: Secondary | ICD-10-CM | POA: Diagnosis not present

## 2021-05-06 DIAGNOSIS — M4312 Spondylolisthesis, cervical region: Secondary | ICD-10-CM | POA: Diagnosis not present

## 2021-05-06 DIAGNOSIS — E860 Dehydration: Secondary | ICD-10-CM | POA: Insufficient documentation

## 2021-05-06 DIAGNOSIS — Z7951 Long term (current) use of inhaled steroids: Secondary | ICD-10-CM | POA: Diagnosis not present

## 2021-05-06 DIAGNOSIS — Z7984 Long term (current) use of oral hypoglycemic drugs: Secondary | ICD-10-CM | POA: Insufficient documentation

## 2021-05-06 DIAGNOSIS — M25511 Pain in right shoulder: Secondary | ICD-10-CM | POA: Insufficient documentation

## 2021-05-06 DIAGNOSIS — J449 Chronic obstructive pulmonary disease, unspecified: Secondary | ICD-10-CM | POA: Diagnosis not present

## 2021-05-06 DIAGNOSIS — R197 Diarrhea, unspecified: Secondary | ICD-10-CM

## 2021-05-06 DIAGNOSIS — I10 Essential (primary) hypertension: Secondary | ICD-10-CM | POA: Diagnosis not present

## 2021-05-06 DIAGNOSIS — M542 Cervicalgia: Secondary | ICD-10-CM | POA: Diagnosis not present

## 2021-05-06 DIAGNOSIS — R55 Syncope and collapse: Secondary | ICD-10-CM | POA: Diagnosis not present

## 2021-05-06 DIAGNOSIS — M19011 Primary osteoarthritis, right shoulder: Secondary | ICD-10-CM | POA: Diagnosis not present

## 2021-05-06 DIAGNOSIS — Z7982 Long term (current) use of aspirin: Secondary | ICD-10-CM | POA: Insufficient documentation

## 2021-05-06 DIAGNOSIS — W01198A Fall on same level from slipping, tripping and stumbling with subsequent striking against other object, initial encounter: Secondary | ICD-10-CM | POA: Diagnosis not present

## 2021-05-06 LAB — CBC WITH DIFFERENTIAL/PLATELET
Abs Immature Granulocytes: 0.04 10*3/uL (ref 0.00–0.07)
Basophils Absolute: 0 10*3/uL (ref 0.0–0.1)
Basophils Relative: 0 %
Eosinophils Absolute: 0.2 10*3/uL (ref 0.0–0.5)
Eosinophils Relative: 2 %
HCT: 52.1 % — ABNORMAL HIGH (ref 39.0–52.0)
Hemoglobin: 16.7 g/dL (ref 13.0–17.0)
Immature Granulocytes: 0 %
Lymphocytes Relative: 23 %
Lymphs Abs: 2.1 10*3/uL (ref 0.7–4.0)
MCH: 31.6 pg (ref 26.0–34.0)
MCHC: 32.1 g/dL (ref 30.0–36.0)
MCV: 98.7 fL (ref 80.0–100.0)
Monocytes Absolute: 0.7 10*3/uL (ref 0.1–1.0)
Monocytes Relative: 8 %
Neutro Abs: 6 10*3/uL (ref 1.7–7.7)
Neutrophils Relative %: 67 %
Platelets: 226 10*3/uL (ref 150–400)
RBC: 5.28 MIL/uL (ref 4.22–5.81)
RDW: 14.1 % (ref 11.5–15.5)
WBC: 9 10*3/uL (ref 4.0–10.5)
nRBC: 0 % (ref 0.0–0.2)

## 2021-05-06 LAB — COMPREHENSIVE METABOLIC PANEL
ALT: 22 U/L (ref 0–44)
AST: 21 U/L (ref 15–41)
Albumin: 4.1 g/dL (ref 3.5–5.0)
Alkaline Phosphatase: 91 U/L (ref 38–126)
Anion gap: 4 — ABNORMAL LOW (ref 5–15)
BUN: 26 mg/dL — ABNORMAL HIGH (ref 8–23)
CO2: 29 mmol/L (ref 22–32)
Calcium: 10.2 mg/dL (ref 8.9–10.3)
Chloride: 108 mmol/L (ref 98–111)
Creatinine, Ser: 1.18 mg/dL (ref 0.61–1.24)
GFR, Estimated: 59 mL/min — ABNORMAL LOW (ref >60)
Glucose, Bld: 160 mg/dL — ABNORMAL HIGH (ref 70–99)
Potassium: 4.7 mmol/L (ref 3.5–5.1)
Sodium: 141 mmol/L (ref 135–145)
Total Bilirubin: 0.7 mg/dL (ref 0.3–1.2)
Total Protein: 7.6 g/dL (ref 6.5–8.1)

## 2021-05-06 LAB — TROPONIN I (HIGH SENSITIVITY): Troponin I (High Sensitivity): 10 ng/L (ref <18)

## 2021-05-06 MED ORDER — FENTANYL CITRATE PF 50 MCG/ML IJ SOSY: 25.0000 ug | PREFILLED_SYRINGE | Freq: Once | INTRAMUSCULAR | Status: DC

## 2021-05-06 MED ORDER — LACTATED RINGERS IV BOLUS
1000.0000 mL | Freq: Once | INTRAVENOUS | Status: AC
Start: 2021-05-06 — End: 2021-05-07
  Administered 2021-05-06: 1000 mL via INTRAVENOUS

## 2021-05-06 MED ORDER — ACETAMINOPHEN 500 MG PO TABS
1000.0000 mg | ORAL_TABLET | Freq: Once | ORAL | Status: AC
  Administered 2021-05-07: 1000 mg via ORAL
  Filled 2021-05-06: qty 2

## 2021-05-06 NOTE — ED Notes (Signed)
Provided pt with hospital oxygen tank to preserve his home oxygen. Informed pt and his daughter, who is with him, on how to tell when tank is getting close to being empty and to notify us if/when that occurs so we can switch the tanks out. ?

## 2021-05-06 NOTE — ED Provider Triage Note (Signed)
Emergency Medicine Provider Triage Evaluation Note ? ?Lance Powell , a 88 y.o. male  was evaluated in triage.  Pt complains of falling.  Pt reports he fell today.  Pt has pain in his right shoulder.  Pt landed on his left shoulder.  Pt also feel a week ago.  Pt has had diarrhea with increasing weakness for the past qweek. ? ?Review of Systems  ?Positive: Shoulder pain ?Negative: Fever no impact of head.  No loss of conciousness  ? ?Physical Exam  ?BP (!) 117/59 (BP Location: Left Arm)   Pulse (!) 106   Temp 97.6 ?F (36.4 ?C) (Oral)   Resp 18   SpO2 92%  ?Gen:   Awake, no distress   ?Resp:  Normal effort  ?MSK:   Moves extremities without difficulty  ?Other:   ? ?Medical Decision Making  ?Medically screening exam initiated at 6:10 PM.  Appropriate orders placed.  Lance Powell was informed that the remainder of the evaluation will be completed by another provider, this initial triage assessment does not replace that evaluation, and the importance of remaining in the ED until their evaluation is complete. ? ? ?  ?Fransico Meadow, [place] ?05/06/21 1812 ? ?

## 2021-05-06 NOTE — ED Provider Notes (Signed)
?[facility] DEPT ?Provider Note ? ? ?CSN: [id number] ?Arrival date & time: 05/06/21  1746 ? ?  ? ?History ? ?Chief Complaint  ?Patient presents with  ? Fall  ? Shoulder Pain  ? ? ?Lance Powell is a 88 y.o. male. ? ? ?Fall ? ?Shoulder Pain ? ?88 year old male with medical history significant for her osteoarthritis, right arthritis, DM 2, HLD, COPD, OSA, hypertension, depression, GERD who presents to the emergency department after a ground-level mechanical fall earlier today.  Patient states that he has had a diarrheal illness for the last 4 days.  Multiple episodes of watery stool daily.  He has been feeling somewhat lightheaded.  He did experience a fall after tripping over his walker today and falling onto close striking his left shoulder.  No loss of consciousness.  Since the fall he has had right shoulder pain, worse with range of motion.  He denies striking the right shoulder.  He denies any chest pain, heart palpitations or shortness of breath.  He denies any abdominal pain or discomfort.  He denies any back pain or urinary or fecal incontinence or numbness or weakness in the extremities.  He left the emergency department GCS 15, ABC intact.  He does state that he has been having increased episodes of lightheadedness and palpitations at home in the setting of his diarrheal illness. ? ?Home Medications ?Prior to Admission medications   ?Medication Sig Start Date End Date Taking? Authorizing Provider  ?albuterol (PROVENTIL) (2.5 MG/3ML) 0.083% nebulizer solution Take 3 mLs (2.5 mg total) by nebulization every 6 (six) hours as needed for wheezing or shortness of breath. 06/01/19   [name], [name], PA-C  ?albuterol (VENTOLIN HFA) 108 (90 Base) MCG/ACT inhaler Inhale 2 puffs into the lungs every 6 (six) hours as needed for wheezing or shortness of breath. 10/20/18   Collene Gobble, MD  ?aspirin 81 MG tablet Take 1 tablet (81 mg total) by mouth every morning. Stop for 1 week and then  resume ?Patient taking differently: Take 81 mg by mouth every morning. 01/08/12   Barton Dubois, MD  ?atorvastatin (LIPITOR) 40 MG tablet 1 tab po q day 08/14/20   [name], [name], PA-C  ?celecoxib (CELEBREX) 200 MG capsule TAKE 1 CAPSULE(200 MG) BY MOUTH DAILY AS NEEDED FOR MODERATE PAIN 03/12/21   [name], [name], PA-C  ?cholecalciferol (VITAMIN D) 1000 UNITS tablet Take 2,000 Units by mouth 2 (two) times daily.    [provider]  ?CONTOUR NEXT TEST test strip USE AS DIRECTED TO CHECK BLOOD SUGAR TWICE DAILY 04/13/21   [name], [name], PA-C  ?fluticasone (FLONASE) 50 MCG/ACT nasal spray Place 1 spray into both nostrils daily. 11/03/19   Collene Gobble, MD  ?Glycopyrrolate-Formoterol (BEVESPI AEROSPHERE) 9-4.8 MCG/ACT AERO Inhale 2 puffs into the lungs 2 (two) times daily. 04/09/21   Collene Gobble, MD  ?guaiFENesin (MUCINEX) 600 MG 12 hr tablet Take 1 tablet (600 mg total) by mouth 2 (two) times daily. 09/13/19   Collene Gobble, MD  ?meclizine (ANTIVERT) 12.5 MG tablet Take 1 tablet (12.5 mg total) by mouth 3 (three) times daily as needed for dizziness. 03/13/17   [name], [name], PA-C  ?metFORMIN (GLUCOPHAGE) 500 MG tablet TAKE 1 TABLET(500 MG) BY MOUTH THREE TIMES DAILY 11/27/20   [name], [name], PA-C  ?misoprostol (CYTOTEC) 100 MCG tablet TAKE 1 TABLET BY MOUTH TWICE DAILY AND 2 TABLETS AT BEDTIME 01/31/21   [name], [name], PA-C  ?Multiple Vitamins-Minerals (CENTRUM) tablet Take 1 tablet by mouth every  morning. Reported on 01/05/2015    [provider]  ?OVER THE COUNTER MEDICATION Place 1-2 drops into both eyes daily as needed (dry red eyes.). Reported on 01/05/2015    [provider]  ?pioglitazone (ACTOS) 15 MG tablet TAKE 1 TABLET(15 MG) BY MOUTH DAILY 04/23/21   [name], [name], PA-C  ?Spacer/Aero-Holding Chambers DEVI 1 Product by Does not apply route daily. 11/02/20   Collene Gobble, MD  ?Tiotropium Bromide-Olodaterol (STIOLTO RESPIMAT) 2.5-2.5 MCG/ACT AERS Inhale 2 puffs into  the lungs daily. 04/04/21   Clayton Bibles, NP  ?Tiotropium Bromide-Olodaterol (STIOLTO RESPIMAT) 2.5-2.5 MCG/ACT AERS Inhale 2 puffs into the lungs daily. 04/04/21   Clayton Bibles, NP  ?UNABLE TO FIND CPAP    [provider]  ?venlafaxine XR (EFFEXOR-XR) 37.5 MG 24 hr capsule Take 1 capsule (37.5 mg total) by mouth daily with breakfast. 07/07/20   [name], [name], PA-C  ?vitamin C (ASCORBIC ACID) 500 MG tablet Take 500 mg by mouth every morning.     [provider]  ?Zoster Vaccine Adjuvanted [facility]) injection Inject into the muscle. 03/05/21   Carlyle Basques, MD  ?   ? ?Allergies    ?Patient has no known allergies.   ? ?Review of Systems   ?Review of Systems  ?All other systems reviewed and are negative. ? ?Physical Exam ?Updated Vital Signs ?BP 139/68   Pulse 78   Temp 97.6 ?F (36.4 ?C) (Oral)   Resp 20   SpO2 98%  ?Physical Exam ?Vitals and nursing note reviewed.  ?Constitutional:   ?   General: He is not in acute distress. ?   Appearance: He is well-developed.  ?   Comments: GCS 15, ABC intact  ?HENT:  ?   Head: Normocephalic and atraumatic.  ?Eyes:  ?   Conjunctiva/sclera: Conjunctivae normal.  ?Neck:  ?   Comments: No midline tenderness to palpation of the cervical spine. ROM intact. ?Cardiovascular:  ?   Rate and Rhythm: Normal rate and regular rhythm.  ?   Pulses: Normal pulses.  ?Pulmonary:  ?   Effort: Pulmonary effort is normal. No respiratory distress.  ?   Breath sounds: Normal breath sounds.  ?Chest:  ?   Comments: Chest wall stable and non-tender to AP and lateral compression. Clavicles stable and non-tender to AP compression ?Abdominal:  ?   Palpations: Abdomen is soft.  ?   Tenderness: There is no abdominal tenderness.  ?   Comments: Pelvis stable to lateral compression.  ?Musculoskeletal:     ?   General: Tenderness present. No swelling.  ?   Cervical back: Neck supple.  ?   Comments: No midline tenderness to palpation of the thoracic or lumbar spine. Extremities  atraumatic with intact ROM.  Right upper extremity neurovascularly intact with some tenderness to palpation of the proximal humerus, 2+ radial pulses. TTP over the right AC joint.  ?Skin: ?   General: Skin is warm and dry.  ?   Capillary Refill: Capillary refill takes less than 2 seconds.  ?Neurological:  ?   Mental Status: He is alert.  ?   Comments: CN II-XII grossly intact. Moving all four extremities spontaneously and sensation grossly intact.  ?Psychiatric:     ?   Mood and Affect: Mood normal.  ? ? ?ED Results / Procedures / Treatments   ?Labs ?(all labs ordered are listed, but only abnormal results are displayed) ?Labs Reviewed  ?CBC WITH DIFFERENTIAL/PLATELET - Abnormal; Notable for the following components:  ?  Result Value  ? HCT 52.1 (*)   ? All other components within normal limits  ?COMPREHENSIVE METABOLIC PANEL - Abnormal; Notable for the following components:  ? Glucose, Bld 160 (*)   ? BUN 26 (*)   ? GFR, Estimated 59 (*)   ? Anion gap 4 (*)   ? All other components within normal limits  ?TROPONIN I (HIGH SENSITIVITY)  ? ? ?EKG ?EKG Interpretation ? ?Date/Time:  Sunday May 06 2021 18:13:15 EDT ?Ventricular Rate:  100 ?PR Interval:  234 ?QRS Duration: 93 ?QT Interval:  347 ?QTC Calculation: 448 ?R Axis:   105 ?Text Interpretation: Right and left arm electrode reversal, interpretation assumes no reversal Sinus tachycardia Prolonged PR interval Right axis deviation Borderline T abnormalities, inferior leads Confirmed by Regan Lemming ([id number]) on 05/06/2021 10:37:05 PM ? ?Radiology ?CT HEAD WO CONTRAST ? ?Result Date: 05/06/2021 ?CLINICAL DATA:  Status post fall. EXAM: CT HEAD WITHOUT CONTRAST TECHNIQUE: Contiguous axial images were obtained from the base of the skull through the vertex without intravenous contrast. RADIATION DOSE REDUCTION: This exam was performed according to the departmental dose-optimization program which includes automated exposure control, adjustment of the mA and/or kV according  to patient size and/or use of iterative reconstruction technique. COMPARISON:  February 27, 2019 FINDINGS: Brain: There is mild cerebral atrophy with widening of the extra-axial spaces and ventricular dilatation.

## 2021-05-06 NOTE — Discharge Instructions (Addendum)
Please follow-up with sports medicine outpatient for a repeat assessment in clinic in one week. ?For pain control you may take at 1000 mg of Tylenol every 8 hours as needed. ?

## 2021-05-06 NOTE — ED Triage Notes (Signed)
Patient BIB family, lightheaded with fall this afternoon with R shoulder pain since that time. Diarrhea x4 days. Takes celebrex daily. Family reports increased episodes of lightheadedness and tachycardia. ?

## 2021-05-07 DIAGNOSIS — M25511 Pain in right shoulder: Secondary | ICD-10-CM | POA: Diagnosis not present

## 2021-05-07 NOTE — ED Notes (Signed)
Pt walked from the bed to wheelchair with walker and no complaints RN notified  ?

## 2021-05-07 NOTE — ED Notes (Signed)
Patient provided with oral hydration ?

## 2021-05-14 ENCOUNTER — Other Ambulatory Visit: Payer: Self-pay | Admitting: Medical

## 2021-05-14 ENCOUNTER — Ambulatory Visit: Payer: Medicare Other | Admitting: Family Medicine

## 2021-05-14 VITALS — BP 130/80 | HR 123 | Ht 72.0 in | Wt 238.0 lb

## 2021-05-14 DIAGNOSIS — M542 Cervicalgia: Secondary | ICD-10-CM

## 2021-05-14 NOTE — Progress Notes (Signed)
? ?I, Lance Powell, LAT, ATC acting as a scribe for Lynne Leader, MD. ? ?Subjective:   ? ?CC: R shoulder pain ? ?HPI: Pt is a pleasant 88 y/o male c/o R shoulder pain ongoing since 4/16 after suffering a fall landing on his L side. Pt was seen at the [facility] ED after the fall and suffering a  diarrheal illness for 4 days prior w/ multiple episodes of watery stool daily, leaving him feeling somewhat lightheaded. Pt tripped over his walker. Pt no longer has any pain in his R shoulder, only intermitted pain along the R-side of his neck w/ cervical rotation. ? ?Dx imaging: 05/06/21 Head & c-spine CT and Chest & R shoulder XR ? ?Pertinent review of Systems: No fevers or chills ? ?Relevant historical information: Sleep apnea.  Dry eyes. ? ? ?Objective:   ? ?Vitals:  ? 05/14/21 1532  ?BP: 130/80  ?Pulse: (!) 123  ?SpO2: 100%  ? ?General: Well Developed, well nourished, and in no acute distress.  ? ?MSK: C-spine: Nontender midline.  Minimally tender palpation right cervical paraspinal musculature. ?Normal cervical motion. ?Upper extremity strength is intact. ?Right shoulder normal. ?Nontender. ?Normal shoulder motion. ?Intact strength. ? ?Lab and Radiology Results ? ?EXAM: ?RIGHT SHOULDER - 1 VIEW ?  ?COMPARISON:  None. ?  ?FINDINGS: ?There is no evidence of fracture or dislocation. Mild degenerative ?changes seen involving the right acromioclavicular joint. Soft ?tissues are unremarkable. ?  ?IMPRESSION: ?Mild degenerative changes of the right acromioclavicular joint. ?  ?  ?Electronically Signed ?  By: Virgina Norfolk M.D. ?  On: 05/06/2021 22:19 ?  ? ?EXAM: ?CT CERVICAL SPINE WITHOUT CONTRAST ?  ?TECHNIQUE: ?Multidetector CT imaging of the cervical spine was performed without ?intravenous contrast. Multiplanar CT image reconstructions were also ?generated. ?  ?RADIATION DOSE REDUCTION: This exam was performed according to the ?departmental dose-optimization program which includes automated ?exposure control,  adjustment of the mA and/or kV according to ?patient size and/or use of iterative reconstruction technique. ?  ?COMPARISON:  11/09/2018 ?  ?FINDINGS: ?Alignment: Mild degenerative anterolisthesis of C4 on C5. ?Straightening of the lower cervical spine due to multilevel facet ?hypertrophy and spondylosis. Otherwise alignment is anatomic. ?  ?Skull base and vertebrae: No acute fracture. No primary bone lesion ?or focal pathologic process. ?  ?Soft tissues and spinal canal: No prevertebral fluid or swelling. No ?visible canal hematoma. ?  ?Disc levels: There is diffuse facet hypertrophy, right predominant. ?Marked spondylosis is seen at C3-4, C5-6, and C6-7. ?  ?Upper chest: Airway is patent. Emphysematous changes at the lung ?apices. ?  ?Other: Reconstructed images demonstrate no additional findings. ?  ?IMPRESSION: ?1. No acute cervical spine fracture. ?2. Diffuse cervical spondylosis and facet hypertrophy. ?  ?  ?Electronically Signed ?  By: Randa Ngo M.D. ?  On: 05/06/2021 22:18 ?  ?I, Lynne Leader, personally (independently) visualized and performed the interpretation of the images attached in this note. ? ? ? ? ?Impression and Recommendations:   ? ?Assessment and Plan: ?88 y.o. male with right lateral neck pain and right shoulder pain after a fall.  Fortunately his pain has either completely resolved or almost completely resolved by the time of the visit.  At this point effectively he has a mild neck strain which is improving.  I think no active treatment is needed.  However he could use a heating pad and Tylenol.  If needed physical therapy would be helpful.  He lives very near the [employer] and  that would be a good choice for physical therapy location. ? ?We reviewed his shoulder x-ray imaging as well as his CT scan C-spine imaging and talked about treatment plan and options as well as neck muscle anatomy. ? ?Recheck back as needed.. ? ? ?Discussed warning signs or symptoms. Please see discharge  instructions. Patient expresses understanding. ? ? ?The above documentation has been reviewed and is accurate and complete Lynne Leader, M.D. ? ?

## 2021-05-14 NOTE — Patient Instructions (Signed)
Thank you for coming in today.  ? ?Let me know if get hurt again and I'd be happy to take a look. ? ?I'm glad you were able to get better on your own. ? ?Recheck back as needed ?

## 2021-05-16 ENCOUNTER — Ambulatory Visit (HOSPITAL_BASED_OUTPATIENT_CLINIC_OR_DEPARTMENT_OTHER): Payer: Medicare Other | Attending: Nurse Practitioner | Admitting: Internal Medicine

## 2021-05-16 DIAGNOSIS — G4733 Obstructive sleep apnea (adult) (pediatric): Secondary | ICD-10-CM

## 2021-05-18 ENCOUNTER — Ambulatory Visit: Payer: Medicare Other | Admitting: Medical

## 2021-05-18 ENCOUNTER — Telehealth: Payer: Self-pay | Admitting: Nurse Practitioner

## 2021-05-18 VITALS — BP 129/82 | HR 100 | Resp 20 | Ht 72.0 in | Wt 237.2 lb

## 2021-05-18 DIAGNOSIS — G4733 Obstructive sleep apnea (adult) (pediatric): Secondary | ICD-10-CM

## 2021-05-18 DIAGNOSIS — E118 Type 2 diabetes mellitus with unspecified complications: Secondary | ICD-10-CM

## 2021-05-18 DIAGNOSIS — G4730 Sleep apnea, unspecified: Secondary | ICD-10-CM

## 2021-05-18 DIAGNOSIS — Z794 Long term (current) use of insulin: Secondary | ICD-10-CM

## 2021-05-18 DIAGNOSIS — W19XXXD Unspecified fall, subsequent encounter: Secondary | ICD-10-CM | POA: Diagnosis not present

## 2021-05-18 DIAGNOSIS — L089 Local infection of the skin and subcutaneous tissue, unspecified: Secondary | ICD-10-CM

## 2021-05-18 MED ORDER — CEPHALEXIN 500 MG PO CAPS: 500.0000 mg | ORAL_CAPSULE | Freq: Two times a day (BID) | ORAL | 0 refills | Status: DC

## 2021-05-18 NOTE — Patient Instructions (Addendum)
For sleep apnea and copd follow up with pulmonologist. Referral placed to pulmonologist office today. Call so you can follow up on need for new machine. ? ?Copd stable on 02 and continue current inhalers. ? ?For diabetes, continue metformin and pioglitazone.  We will get metabolic panel and V6H week of May 29, 2021.  I will review labs and decide if any changes need to be made. ? ?For skin infection rx keflex. ? ?Fall x 1. Shoulder pain resolved. If any future falls refer to PT for gait assessment and walker training. ? ?Follow up in September 03, 2021 or sooner if needed. ? ? ? ? ? ?

## 2021-05-18 NOTE — Progress Notes (Signed)
? ?Subjective:  ? ? Patient ID: Lance Powell, male    DOB: 1929/04/24, 88 y.o.   MRN: [id number] ? ?HPI ? ?Pt in for follow up. ? ?Pt fall on 05-06-2021. Work up in ED negative. But did have rt shoulder pain after the fall.  ? ?He states this was his only fall before per his report. Walker with wheels satrted rolling and he fell.  ? ?Followed up with sports med.  ? ?Sport MD evaluation below in " ? ?  ?"Assessment and Plan: ?88 y.o. male with right lateral neck pain and right shoulder pain after a fall.  Fortunately his pain has either completely resolved or almost completely resolved by the time of the visit.  At this point effectively he has a mild neck strain which is improving.  I think no active treatment is needed.  However he could use a heating pad and Tylenol.  If needed physical therapy would be helpful.  He lives very near the [facility] and that would be a good choice for physical therapy location. ?  ?We reviewed his shoulder x-ray imaging as well as his CT scan C-spine imaging and talked about treatment plan and options as well as neck muscle anatomy. ?  ?Recheck back as needed." ? ?Shoulder pain now resolved. ? ? ?Pt has sleep apnea. Pt has machine older than 5 years. Pt sees pulmonologist. Dr. West Carbo. Filter on machine is missing and pt likely breathing in debree. He needs new machine ? ?About one week ago he pulled bandaid off left arm and skin came off. Now dry yellow scab but no pain. ? ? ? ?Review of Systems  ?Constitutional:  Negative for chills, fatigue and fever.  ?Respiratory:  Negative for cough, chest tightness, shortness of breath and wheezing.   ?Cardiovascular:  Negative for chest pain and palpitations.  ?Gastrointestinal:  Negative for abdominal pain.  ?Genitourinary:  Negative for dysuria and frequency.  ?Musculoskeletal:  Negative for back pain, gait problem, joint swelling, myalgias and neck stiffness.  ?Neurological:  Negative for dizziness, numbness and headaches.   ?Hematological:  Negative for adenopathy. Does not bruise/bleed easily.  ?Psychiatric/Behavioral:  Negative for confusion. The patient is not nervous/anxious.   ? ? ?Past Medical History:  ?Diagnosis Date  ? 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 COPD (chronic obstructive pulmonary disease) ([place])   ? Depression 03/08/2014  ? Diabetes mellitus   ? GERD (gastroesophageal reflux disease) 03/08/2014  ? History of blood transfusion   ? Hyperlipemia   ? Hypertension   ? OSA (obstructive sleep apnea)   ? Osteoarthritis   ? Rheumatoid arthritis(714.0)   ? Sleep apnea   ? cpap  ? ?  ?Social History  ? ?Socioeconomic History  ? Marital status: Widowed  ?  Spouse name: Not on file  ? Number of children: 6  ? Years of education: Not on file  ? Highest education level: Not on file  ?Occupational History  ? Occupation: Retired  ?  Comment: Engineer  ?Tobacco Use  ? Smoking status: Former  ?  Packs/day: 4.00  ?  Years: 30.00  ?  Pack years: 120.00  ?  Types: Cigarettes  ?  Quit date: 01/21/1978  ?  Years since quitting: 43.3  ? Smokeless tobacco: Never  ?Vaping Use  ? Vaping Use: Never used  ?Substance and Sexual Activity  ? Alcohol use: No  ? Drug use: No  ? Sexual activity: Not on file  ?Other Topics Concern  ?  Not on file  ?Social History Narrative  ? Right Handed  ? Lives in a one story home  ? Drinks no caffeine   ? ?Social Determinants of Health  ? ?Financial Resource Strain: Medium Risk  ? Difficulty of Paying Living Expenses: Somewhat hard  ?Food Insecurity: No Food Insecurity  ? Worried About Charity fundraiser in the Last Year: Never true  ? Ran Out of Food in the Last Year: Never true  ?Transportation Needs: No Transportation Needs  ? Lack of Transportation (Medical): No  ? Lack of Transportation (Non-Medical): No  ?Physical Activity: Inactive  ? Days of Exercise per Week: 0 days  ? Minutes of Exercise per Session: 0 min  ?Stress: No Stress Concern Present  ? Feeling of Stress : Not at all  ?Social Connections: Socially Isolated   ? Frequency of Communication with Friends and Family: More than three times a week  ? Frequency of Social Gatherings with Friends and Family: More than three times a week  ? Attends Religious Services: Never  ? Active Member of Clubs or Organizations: No  ? Attends Archivist Meetings: Never  ? Marital Status: Widowed  ?Intimate Partner Violence: Not on file  ? ? ?Past Surgical History:  ?Procedure Laterality Date  ? APPENDECTOMY  1969  ? CHOLECYSTECTOMY N/A 06/16/2014  ? Procedure: LAPAROSCOPIC CHOLECYSTECTOMY;  Surgeon: Greer Pickerel, MD;  Location: [facility];  Service: General;  Laterality: N/A;  ? CYSTOSCOPY  01/23/2011  ? Procedure: CYSTOSCOPY;  Surgeon: Molli Hazard, MD;  Location: [facility];  Service: Urology;  Laterality: N/A;  ? FLEXIBLE SIGMOIDOSCOPY  01/07/2012  ? Procedure: FLEXIBLE SIGMOIDOSCOPY;  Surgeon: Ladene Artist, MD,FACG;  Location: [facility];  Service: Endoscopy;  Laterality: N/A;  ? HERNIA REPAIR  1990  ? right and left inguinal  ? NISSEN FUNDOPLICATION    ? SIGMOIDECTOMY  2004  ? colovesical fistula  ? TONSILLECTOMY  1937  ? TRANSURETHRAL RESECTION OF PROSTATE  01/23/2011  ? Procedure: TRANSURETHRAL RESECTION OF THE PROSTATE WITH GYRUS INSTRUMENTS;  Surgeon: Molli Hazard, MD;  Location[facility];  Service: Urology;  Laterality: N/A;  ? [place]  ? ? ?Family History  ?Problem Relation Age of Onset  ? Heart disease Mother   ? Emphysema Brother   ? Cancer Daughter   ?     breast  ? ? ?No Known Allergies ? ?Current Outpatient Medications on File Prior to Visit  ?Medication Sig Dispense Refill  ? aspirin 81 MG tablet Take 1 tablet (81 mg total) by mouth every morning. Stop for 1 week and then resume (Patient taking differently: Take 81 mg by mouth every morning.)    ? atorvastatin (LIPITOR) 40 MG tablet 1 tab po q day (Patient taking differently: Take 40 mg by mouth daily.) 90 tablet 3  ? celecoxib (CELEBREX) 200 MG capsule TAKE 1  CAPSULE(200 MG) BY MOUTH DAILY AS NEEDED FOR MODERATE PAIN 30 capsule 1  ? Cholecalciferol (VITAMIN D) 50 MCG (2000 UT) tablet Take 2,000 Units by mouth 2 (two) times daily.    ? CONTOUR NEXT TEST test strip USE AS DIRECTED TO CHECK BLOOD SUGAR TWICE DAILY 100 strip 2  ? Glycopyrrolate-Formoterol (BEVESPI AEROSPHERE) 9-4.8 MCG/ACT AERO Inhale 2 puffs into the lungs 2 (two) times daily. 10.7 g 6  ? Indacaterol Maleate (ARCAPTA NEOHALER) 75 MCG CAPS Place 75 capsules into inhaler and inhale daily.    ? metFORMIN (GLUCOPHAGE) 500 MG tablet TAKE  1 TABLET(500 MG) BY MOUTH THREE TIMES DAILY (Patient taking differently: Take 500 mg by mouth 2 (two) times daily with a meal.) 90 tablet 3  ? misoprostol (CYTOTEC) 100 MCG tablet TAKE 1 TABLET BY MOUTH TWICE DAILY AND 2 TABLETS AT BEDTIME (Patient taking differently: Take 100-200 mcg by mouth See admin instructions. Take 1 tablet by mouth twice a day and 2 tablets at bedtime) 360 tablet 3  ? Multiple Vitamins-Minerals (CENTRUM) tablet Take 1 tablet by mouth every morning. Reported on 01/05/2015    ? OVER THE COUNTER MEDICATION Place 1-2 drops into both eyes daily as needed (dry red eyes.). Reported on 01/05/2015    ? pioglitazone (ACTOS) 15 MG tablet TAKE 1 TABLET(15 MG) BY MOUTH DAILY (Patient taking differently: Take 15 mg by mouth daily at 12 noon.) 90 tablet 3  ? psyllium (METAMUCIL) 58.6 % packet Take 1 packet by mouth daily at 12 noon.    ? Spacer/Aero-Holding Chambers DEVI 1 Product by Does not apply route daily. 1 Product 0  ? Tiotropium Bromide-Olodaterol (STIOLTO RESPIMAT) 2.5-2.5 MCG/ACT AERS Inhale 2 puffs into the lungs daily. 4 g 5  ? UNABLE TO FIND CPAP    ? venlafaxine XR (EFFEXOR-XR) 37.5 MG 24 hr capsule Take 1 capsule (37.5 mg total) by mouth daily with breakfast. 90 capsule 3  ? vitamin C (ASCORBIC ACID) 500 MG tablet Take 500 mg by mouth every morning.     ? Zoster Vaccine Adjuvanted [facility]) injection Inject into the muscle. 1 each 1  ? ?No current  facility-administered medications on file prior to visit.  ? ? ?BP 129/82   Pulse 100   Resp 20   Ht 6' (1.829 m)   Wt 237 lb 3.2 oz (107.6 kg)   SpO2 92%   BMI 32.17 kg/m?  ?  ?   ?Objective:  ? Physical Exam ?

## 2021-05-20 DIAGNOSIS — J9610 Chronic respiratory failure, unspecified whether with hypoxia or hypercapnia: Secondary | ICD-10-CM | POA: Diagnosis not present

## 2021-05-20 DIAGNOSIS — G4733 Obstructive sleep apnea (adult) (pediatric): Secondary | ICD-10-CM | POA: Diagnosis not present

## 2021-05-20 DIAGNOSIS — J449 Chronic obstructive pulmonary disease, unspecified: Secondary | ICD-10-CM | POA: Diagnosis not present

## 2021-05-21 NOTE — Telephone Encounter (Signed)
[name] Lance Powell, [place] ?

## 2021-05-29 ENCOUNTER — Other Ambulatory Visit: Payer: Medicare Other

## 2021-05-30 NOTE — Telephone Encounter (Signed)
Received pulmonary referral for sleep consult.  Patient is needing a new cpap. ?Patient was seen by Joellen Jersey, NP 04/04/21 and is established with Dr. Lamonte Sakai, next OV 07/05/21. ?Called and spoke with patient's daughter [name].  [name] stated patient went to mask fitting and was told at appointment that patient needed a new machine, because the door was off cpap, and dust is able to get inside cpap.  [name] stated they spoke with [employer] and patient's cpap is over 40 years old. [name] stated [employer] could not replace cpap door, but advised they contact pulmonary for DME order for replacement cpap.   ? ?Message routed to [facility], NP to advise on new cpap order to [employer] ?

## 2021-05-30 NOTE — Telephone Encounter (Signed)
DME order placed. Patient's daughter [name] aware cpap order placed to [employer].  Nothing further at this time. ?

## 2021-05-30 NOTE — Telephone Encounter (Signed)
Please send order to DME for new CPAP machine 11 cmH2O, heated humidification and mask of choice. Follow up in 31-90 days after receiving new machine for compliance check for insurance and to ensure that his OSA is better controlled with new machine and better fitting  mask. Thanks!

## 2021-06-11 ENCOUNTER — Other Ambulatory Visit (INDEPENDENT_AMBULATORY_CARE_PROVIDER_SITE_OTHER): Payer: Medicare Other

## 2021-06-11 DIAGNOSIS — Z794 Long term (current) use of insulin: Secondary | ICD-10-CM | POA: Diagnosis not present

## 2021-06-11 DIAGNOSIS — E118 Type 2 diabetes mellitus with unspecified complications: Secondary | ICD-10-CM

## 2021-06-12 LAB — COMPREHENSIVE METABOLIC PANEL
ALT: 18 U/L (ref 0–53)
AST: 16 U/L (ref 0–37)
Albumin: 4.3 g/dL (ref 3.5–5.2)
Alkaline Phosphatase: 81 U/L (ref 39–117)
BUN: 23 mg/dL (ref 6–23)
CO2: 31 mEq/L (ref 19–32)
Calcium: 11 mg/dL — ABNORMAL HIGH (ref 8.4–10.5)
Chloride: 102 mEq/L (ref 96–112)
Creatinine, Ser: 1.35 mg/dL (ref 0.40–1.50)
GFR: 46.49 mL/min — ABNORMAL LOW (ref >60.00)
Glucose, Bld: 120 mg/dL — ABNORMAL HIGH (ref 70–99)
Potassium: 4.5 mEq/L (ref 3.5–5.1)
Sodium: 143 mEq/L (ref 135–145)
Total Bilirubin: 0.5 mg/dL (ref 0.2–1.2)
Total Protein: 7.1 g/dL (ref 6.0–8.3)

## 2021-06-12 LAB — HEMOGLOBIN A1C: Hgb A1c MFr Bld: 6.6 % — ABNORMAL HIGH (ref 4.6–6.5)

## 2021-06-17 ENCOUNTER — Other Ambulatory Visit: Payer: Self-pay | Admitting: Medical

## 2021-06-19 DIAGNOSIS — J9610 Chronic respiratory failure, unspecified whether with hypoxia or hypercapnia: Secondary | ICD-10-CM | POA: Diagnosis not present

## 2021-06-19 DIAGNOSIS — G4733 Obstructive sleep apnea (adult) (pediatric): Secondary | ICD-10-CM | POA: Diagnosis not present

## 2021-06-19 DIAGNOSIS — J449 Chronic obstructive pulmonary disease, unspecified: Secondary | ICD-10-CM | POA: Diagnosis not present

## 2021-07-05 ENCOUNTER — Ambulatory Visit: Payer: Medicare Other | Admitting: Emergency Medicine

## 2021-07-11 ENCOUNTER — Telehealth: Payer: Self-pay | Admitting: Medical

## 2021-07-11 ENCOUNTER — Encounter: Payer: Self-pay | Admitting: Family Medicine

## 2021-07-11 ENCOUNTER — Ambulatory Visit (INDEPENDENT_AMBULATORY_CARE_PROVIDER_SITE_OTHER): Payer: Medicare Other | Admitting: Family Medicine

## 2021-07-11 ENCOUNTER — Ambulatory Visit: Payer: Medicare Other | Admitting: Family Medicine

## 2021-07-11 VITALS — BP 120/78 | HR 84 | Temp 97.4°F | Ht 72.0 in | Wt 233.0 lb

## 2021-07-11 DIAGNOSIS — E118 Type 2 diabetes mellitus with unspecified complications: Secondary | ICD-10-CM

## 2021-07-11 DIAGNOSIS — K529 Noninfective gastroenteritis and colitis, unspecified: Secondary | ICD-10-CM | POA: Insufficient documentation

## 2021-07-11 DIAGNOSIS — Z794 Long term (current) use of insulin: Secondary | ICD-10-CM | POA: Diagnosis not present

## 2021-07-11 MED ORDER — CHOLESTYRAMINE 4 G PO PACK
4.0000 g | PACK | Freq: Three times a day (TID) | ORAL | 12 refills | Status: DC
Start: 2021-07-11 — End: 2021-08-13

## 2021-07-11 NOTE — Addendum Note (Signed)
Addended by: Manuela Schwartz on: 07/11/2021 02:28 PM   Modules accepted: Orders

## 2021-07-11 NOTE — Telephone Encounter (Signed)
Patient would like his daughter Macario Carls [phone] to receive the call to schedule the gastro referral.

## 2021-07-11 NOTE — Progress Notes (Signed)
Chief Complaint  Patient presents with   Diarrhea    Off and on for 6 months   Abdominal Pain    Lance Powell is 88 y.o. male here for complaint of diarrhea.  He is here with 2 of his daughters.  Duration: 6 mo, worsening over past 4 weeks Having incontinence and more explosive over past 4 weeks Abdominal pain? Yes Having 1 BM daily.  Bleeding? No Recent travel? No Recent antibiotic use? No Sick contacts? No Fevers? 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   COPD (chronic obstructive pulmonary disease) ([facility])    Depression 03/08/2014   Diabetes mellitus    GERD (gastroesophageal reflux disease) 03/08/2014   History of blood transfusion    Hyperlipemia    Hypertension    OSA (obstructive sleep apnea)    Osteoarthritis    Rheumatoid arthritis(714.0)    Sleep apnea    cpap    BP 120/78   Pulse 84   Temp (!) 97.4 F (36.3 C) (Oral)   Ht 6' (1.829 m)   Wt 233 lb (105.7 kg)   SpO2 92%   BMI 31.60 kg/m  Gen: awake, alert, appearing stated age 73: MMM Heart: RRR Lungs: CTAB, no accessory muscle use Abd: BS+, soft, TTP, non-distended, no masses or organomegaly Psych: Normal affect and mood  Chronic diarrhea - Plan: cholestyramine (QUESTRAN) 4 g packet, Ambulatory referral to Gastroenterology, GI Profile, Stool, PCR, GI Profile, Stool, PCR  Type 2 diabetes mellitus with complication, with long-term current use of insulin ([place]) - Plan: GI Profile, Stool, PCR  Chronic issue, not controlled.  Check stool culture given his age/diabetes and duration.  Daily fiber supplement recommended.  Trial of Questran 3 times daily to help with diarrhea.  Refer to gastroenterology for further evaluation as well. F/u as originally scheduled with his normal PCP. The patient and his daughters voiced understanding and agreement to the plan.  [place], DO 07/11/21 1:50 PM

## 2021-07-11 NOTE — Patient Instructions (Signed)
If you do not hear anything about your referral in the next 1-2 weeks, call our office and ask for an update.  Take Metamucil or Benefiber daily.  We will be in touch regarding the stool culture results.   Let us know if you need anything.

## 2021-07-12 ENCOUNTER — Other Ambulatory Visit: Payer: Medicare Other

## 2021-07-12 DIAGNOSIS — K529 Noninfective gastroenteritis and colitis, unspecified: Secondary | ICD-10-CM

## 2021-07-12 DIAGNOSIS — E118 Type 2 diabetes mellitus with unspecified complications: Secondary | ICD-10-CM

## 2021-07-12 DIAGNOSIS — Z794 Long term (current) use of insulin: Secondary | ICD-10-CM | POA: Diagnosis not present

## 2021-07-13 ENCOUNTER — Encounter: Payer: Self-pay | Admitting: Physician Assistant

## 2021-07-14 LAB — GI PROFILE, STOOL, PCR

## 2021-07-16 ENCOUNTER — Ambulatory Visit: Payer: Medicare Other | Admitting: Medical

## 2021-07-17 ENCOUNTER — Other Ambulatory Visit: Payer: Self-pay | Admitting: Medical

## 2021-07-17 DIAGNOSIS — H02832 Dermatochalasis of right lower eyelid: Secondary | ICD-10-CM | POA: Diagnosis not present

## 2021-07-17 DIAGNOSIS — H524 Presbyopia: Secondary | ICD-10-CM | POA: Diagnosis not present

## 2021-07-17 DIAGNOSIS — E119 Type 2 diabetes mellitus without complications: Secondary | ICD-10-CM | POA: Diagnosis not present

## 2021-07-17 DIAGNOSIS — Z961 Presence of intraocular lens: Secondary | ICD-10-CM | POA: Diagnosis not present

## 2021-08-08 ENCOUNTER — Ambulatory Visit: Payer: Medicare Other | Admitting: Emergency Medicine

## 2021-08-13 ENCOUNTER — Other Ambulatory Visit (INDEPENDENT_AMBULATORY_CARE_PROVIDER_SITE_OTHER): Payer: Medicare Other

## 2021-08-13 ENCOUNTER — Ambulatory Visit: Payer: Medicare Other | Admitting: Physician Assistant

## 2021-08-13 ENCOUNTER — Encounter: Payer: Self-pay | Admitting: Physician Assistant

## 2021-08-13 VITALS — BP 116/72 | HR 114 | Ht 72.0 in | Wt 230.0 lb

## 2021-08-13 DIAGNOSIS — R197 Diarrhea, unspecified: Secondary | ICD-10-CM

## 2021-08-13 DIAGNOSIS — R194 Change in bowel habit: Secondary | ICD-10-CM

## 2021-08-13 DIAGNOSIS — R1084 Generalized abdominal pain: Secondary | ICD-10-CM

## 2021-08-13 LAB — BASIC METABOLIC PANEL
BUN: 21 mg/dL (ref 6–23)
CO2: 27 mEq/L (ref 19–32)
Calcium: 10.6 mg/dL — ABNORMAL HIGH (ref 8.4–10.5)
Chloride: 104 mEq/L (ref 96–112)
Creatinine, Ser: 1.18 mg/dL (ref 0.40–1.50)
GFR: 54.57 mL/min — ABNORMAL LOW (ref >60.00)
Glucose, Bld: 137 mg/dL — ABNORMAL HIGH (ref 70–99)
Potassium: 3.9 mEq/L (ref 3.5–5.1)
Sodium: 141 mEq/L (ref 135–145)

## 2021-08-13 MED ORDER — COLESTIPOL HCL 1 G PO TABS: 2.0000 g | ORAL_TABLET | Freq: Two times a day (BID) | ORAL | 3 refills | Status: DC

## 2021-08-13 NOTE — Progress Notes (Signed)
Chief Complaint: Chronic diarrhea  HPI:    Lance Powell is an 88 year old male, known to Dr. Fuller Plan, with a past medical history of COPD, diabetes, GERD, rheumatoid arthritis and multiple others listed below, who was referred to me by [name], PA-C for a complaint of chronic diarrhea.      12/27/2011 colonoscopy with a 5 mm sessile polyp in the descending colon and moderate diverticulosis and otherwise normal.  Pathology showed inflammatory polyp.    01/07/2012 flex sigmoidoscopy for suspected diverticular bleed showed a resolving diverticular bleed.    06/11/2021 CMP normal other than elevated glucose at 120.  Hemoglobin A1c 6.6.    07/11/2021 office visit with patient's PCP for worsening diarrhea.  Apparently going on for the past 6 months but worsening over the past month.  At that time started Questran 4 g packet 3 times daily and patient was referred here.    07/13/2018 3 GI profile panel negative.    Today, the patient and his daughters tell me that he has had diarrhea for at least the past 6 to 8 months which occurs at least 3 times a week and will occur 2-3 times that day, in between he may have "good days", where he does not have diarrhea at all, but this is consistent and he does not like it.  It is also very "messy".  They explained that he was started on Benefiber which they are giving him daily as well as Cholestyramine but he hates the taste of it so they can only give him one packet a day instead of 3.  They ask about imaging and further labs.  They tell me this has been "going on too long".  They describe some weight loss about 20 pounds over the past year or so and the weakness which they feel is associated to the diarrhea.  They have tried multiple diet changes which do not help at all.  Does experience abdominal pain with bowel movements at times.    Denies fever, chills, blood in his stool, nausea or vomiting.  Past Medical History:  Diagnosis Date   A[place]y of bladder  02/05/2013   COPD (chronic obstructive pulmonary disease) ([place])    Depression 03/08/2014   Diabetes mellitus    GERD (gastroesophageal reflux disease) 03/08/2014   History of blood transfusion    Hyperlipemia    Hypertension    OSA (obstructive sleep apnea)    Osteoarthritis    Rheumatoid arthritis(714.0)    Sleep apnea    cpap    Past Surgical History:  Procedure Laterality Date   APPENDECTOMY  1969   CHOLECYSTECTOMY N/A 06/16/2014   Procedure: LAPAROSCOPIC CHOLECYSTECTOMY;  Surgeon: Greer Pickerel, MD;  Location: [facility];  Service: General;  Laterality: N/A;   CYSTOSCOPY  01/23/2011   Procedure: Consuela Mimes;  Surgeon: Molli Hazard, MD;  Location: [facility];  Service: Urology;  Laterality: N/A;   FLEXIBLE SIGMOIDOSCOPY  01/07/2012   Procedure: FLEXIBLE SIGMOIDOSCOPY;  Surgeon: Ladene Artist, MD,FACG;  Location: [facility];  Service: Endoscopy;  Laterality: N/A;   HERNIA REPAIR  1990   right and left inguinal   NISSEN FUNDOPLICATION     SIGMOIDECTOMY  2004   colovesical fistula   TONSILLECTOMY  1937   TRANSURETHRAL RESECTION OF PROSTATE  01/23/2011   Procedure: TRANSURETHRAL RESECTION OF THE PROSTATE WITH GYRUS INSTRUMENTS;  Surgeon: Molli Hazard, MD;  Location: [facility];  Service: Urology;  Laterality: N/A;   [place]  Current Outpatient Medications  Medication Sig Dispense Refill   aspirin 81 MG tablet Take 1 tablet (81 mg total) by mouth every morning. Stop for 1 week and then resume (Patient taking differently: Take 81 mg by mouth every morning.)     atorvastatin (LIPITOR) 40 MG tablet TAKE 1 TABLET BY MOUTH EVERY DAY 90 tablet 3   celecoxib (CELEBREX) 200 MG capsule TAKE 1 CAPSULE(200 MG) BY MOUTH DAILY AS NEEDED FOR MODERATE PAIN 30 capsule 1   Cholecalciferol (VITAMIN D) 50 MCG (2000 UT) tablet Take 2,000 Units by mouth 2 (two) times daily.     cholestyramine (QUESTRAN) 4 g packet Take 1 packet (4 g total) by mouth 3  (three) times daily with meals. 60 each 12   CONTOUR NEXT TEST test strip USE AS DIRECTED TO CHECK BLOOD SUGAR TWICE DAILY 100 strip 2   Glycopyrrolate-Formoterol (BEVESPI AEROSPHERE) 9-4.8 MCG/ACT AERO Inhale 2 puffs into the lungs 2 (two) times daily. 10.7 g 6   Indacaterol Maleate (ARCAPTA NEOHALER) 75 MCG CAPS Place 75 capsules into inhaler and inhale daily.     metFORMIN (GLUCOPHAGE) 500 MG tablet TAKE 1 TABLET(500 MG) BY MOUTH THREE TIMES DAILY (Patient taking differently: Take 500 mg by mouth 2 (two) times daily with a meal.) 90 tablet 3   misoprostol (CYTOTEC) 100 MCG tablet TAKE 1 TABLET BY MOUTH TWICE DAILY AND 2 TABLETS AT BEDTIME (Patient taking differently: Take 100-200 mcg by mouth See admin instructions. Take 1 tablet by mouth twice a day and 2 tablets at bedtime) 360 tablet 3   Multiple Vitamins-Minerals (CENTRUM) tablet Take 1 tablet by mouth every morning. Reported on 01/05/2015     OVER THE COUNTER MEDICATION Place 1-2 drops into both eyes daily as needed (dry red eyes.). Reported on 01/05/2015     pioglitazone (ACTOS) 15 MG tablet TAKE 1 TABLET(15 MG) BY MOUTH DAILY (Patient taking differently: Take 15 mg by mouth daily at 12 noon.) 90 tablet 3   psyllium (METAMUCIL) 58.6 % packet Take 1 packet by mouth daily at 12 noon.     Spacer/Aero-Holding Chambers DEVI 1 Product by Does not apply route daily. 1 Product 0   Tiotropium Bromide-Olodaterol (STIOLTO RESPIMAT) 2.5-2.5 MCG/ACT AERS Inhale 2 puffs into the lungs daily. 4 g 5   UNABLE TO FIND CPAP     venlafaxine XR (EFFEXOR-XR) 37.5 MG 24 hr capsule TAKE 1 CAPSULE(37.5 MG) BY MOUTH DAILY WITH BREAKFAST 90 capsule 3   vitamin C (ASCORBIC ACID) 500 MG tablet Take 500 mg by mouth every morning.      Zoster Vaccine Adjuvanted [facility]) injection Inject into the muscle. 1 each 1   No current facility-administered medications for this visit.    Allergies as of 08/13/2021   (No Known Allergies)    Family History  Problem Relation  Age of Onset   Heart disease Mother    Emphysema Brother    Cancer Daughter        breast    Social History   Socioeconomic History   Marital status: Widowed    Spouse name: Not on file   Number of children: 6   Years of education: Not on file   Highest education level: Not on file  Occupational History   Occupation: Retired    Comment: Chief Financial Officer  Tobacco Use   Smoking status: Former    Packs/day: 4.00    Years: 30.00    Total pack years: 120.00    Types: Cigarettes    Quit date:  01/21/1978    Years since quitting: 43.5   Smokeless tobacco: Never  Vaping Use   Vaping Use: Never used  Substance and Sexual Activity   Alcohol use: No   Drug use: No   Sexual activity: Not on file  Other Topics Concern   Not on file  Social History Narrative   Right Handed   Lives in a one story home   Drinks no caffeine    Social Determinants of Health   Financial Resource Strain: Medium Risk (12/31/2020)   Overall Financial Resource Strain (CARDIA)    Difficulty of Paying Living Expenses: Somewhat hard  Food Insecurity: No Food Insecurity (03/02/2021)   Hunger Vital Sign    Worried About Running Out of Food in the Last Year: Never true    Ran Out of Food in the Last Year: Never true  Transportation Needs: No Transportation Needs (03/02/2021)   PRAPARE - Hydrologist (Medical): No    Lack of Transportation (Non-Medical): No  Physical Activity: Inactive (03/02/2021)   Exercise Vital Sign    Days of Exercise per Week: 0 days    Minutes of Exercise per Session: 0 min  Stress: No Stress Concern Present (03/02/2021)   [place]    Feeling of Stress : Not at all  Social Connections: Socially Isolated (03/02/2021)   Social Connection and Isolation Panel [NHANES]    Frequency of Communication with Friends and Family: More than three times a week    Frequency of Social Gatherings with Friends and  Family: More than three times a week    Attends Religious Services: Never    Marine scientist or Organizations: No    Attends Archivist Meetings: Never    Marital Status: Widowed  Human resources officer Violence: Not on file    Review of Systems:    Constitutional: No weight loss, fever or chills Skin: No rash  Cardiovascular: No chest pain  Respiratory: +chronic SOB on nasal o2 Gastrointestinal: See HPI and otherwise negative Genitourinary: No dysuria Neurological: No headache Musculoskeletal: No new muscle or joint pain Hematologic: No bleeding  Psychiatric: No history of depression or anxiety   Physical Exam:  Vital signs: BP 116/72   Pulse (!) 114   Ht 6' (1.829 m)   Wt 230 lb (104.3 kg)   BMI 31.19 kg/m    Constitutional:   Pleasant Elderly Caucasian male appears to be in NAD, Well developed, Well nourished, alert and cooperative Head:  Normocephalic and atraumatic. Eyes:   PEERL, EOMI. No icterus. Conjunctiva pink. Ears:  +HOH Neck:  Supple Throat: Oral cavity and pharynx without inflammation, swelling or lesion.  Respiratory: Respirations even and unlabored. Lungs clear to auscultation bilaterally.   No wheezes, crackles, or rhonchi. + on O2 via [place] Cardiovascular: Normal S1, S2. No MRG. Regular rate and rhythm. No peripheral edema, cyanosis or pallor.  Gastrointestinal:  Soft, nondistended, nontender. No rebound or guarding. Increased BS all four quadrants,  No appreciable masses or hepatomegaly. Rectal:  Not performed.  Msk:  Symmetrical without gross deformities. Without edema, no deformity or joint abnormality.  Neurologic:  Alert and  oriented x4;  grossly normal neurologically.  Skin:   Dry and intact without significant lesions or rashes. Psychiatric: Demonstrates good judgement and reason without abnormal affect or behaviors.  RELEVANT LABS AND IMAGING: CBC    Component Value Date/Time   WBC 9.0 05/06/2021 1844   RBC 5.28 05/06/2021  1844    HGB 16.7 05/06/2021 1844   HCT 52.1 (H) 05/06/2021 1844   PLT 226 05/06/2021 1844   MCV 98.7 05/06/2021 1844   MCH 31.6 05/06/2021 1844   MCHC 32.1 05/06/2021 1844   RDW 14.1 05/06/2021 1844   LYMPHSABS 2.1 05/06/2021 1844   MONOABS 0.7 05/06/2021 1844   EOSABS 0.2 05/06/2021 1844   BASOSABS 0.0 05/06/2021 1844    CMP     Component Value Date/Time   NA 143 06/11/2021 1520   K 4.5 06/11/2021 1520   CL 102 06/11/2021 1520   CO2 31 06/11/2021 1520   GLUCOSE 120 (H) 06/11/2021 1520   BUN 23 06/11/2021 1520   CREATININE 1.35 06/11/2021 1520   CREATININE 1.21 09/01/2020 1444   CALCIUM 11.0 (H) 06/11/2021 1520   PROT 7.1 06/11/2021 1520   ALBUMIN 4.3 06/11/2021 1520   AST 16 06/11/2021 1520   ALT 18 06/11/2021 1520   ALKPHOS 81 06/11/2021 1520   BILITOT 0.5 06/11/2021 1520   GFRNONAA 59 (L) 05/06/2021 1844   GFRNONAA 44 (L) 04/25/2016 1244   GFRAA >60 09/14/2018 1657   GFRAA 51 (L) 04/25/2016 1244    Assessment: 1.  Chronic diarrhea: For at least the past 6 to 8 months, 3-4 times a week per the family at least 2-3 times a day those days with some abdominal pain, last colonoscopy 10 years ago was normal, recent GI path panel negative, TSH normal, hemoglobin A1c elevated; consider medication side effect from metformin versus inflammatory versus infectious versus microscopic colitis 2.  Change in bowel habits: As above 3.  Weight loss: About 20 to 30 pounds over the past year or so; could be connected to age +/- his chronic diarrhea 4.  Generalized abdominal pain: As above  Plan: 1.  Discussed with patient and his family there is a possibility that his Metformin is giving him diarrhea.  Told him to discuss further with his PCP at follow-up.  They could do a trial off of this medication and/or change agents. 2.  Patient cannot tolerate powder Cholestyramine.  We will change to Colestipol tablets to twice daily for now and can increase as tolerated.  Prescribed #120 with 1  refill. 3.  Continue Benefiber 4.  Ordered a CT of the abdomen pelvis with contrast for further evaluation. 5.  Ordered further stool studies to include O&P, fecal pancreatic elastase, calprotectin and lactoferrin. 6.  Discussed with the patient and his family that if everything above is unrevealing and he continues with diarrhea we could discuss a repeat colonoscopy, though I would like to avoid this given his age and increased risk and the fact that we would need to do this procedure in the hospital for him, likely an overnight stay given that he could likely not prep at home. 7.  Could also schedule out Imodium or Lomotil in the future to help. 8.  Patient to follow in clinic with me in 1 to 2 months or sooner as indicated by labs and imaging above  [name], PA-C [facility] 08/13/2021, 2:50 PM  Cc: [name], PA-C

## 2021-08-13 NOTE — Patient Instructions (Signed)
We have sent the following medications to your pharmacy for you to pick up at your convenience: Colestipol 2 tablets twice daily.   Your provider has requested that you go to the basement level for lab work before leaving today. Press "B" on the elevator. The lab is located at the first door on the left as you exit the elevator.  You have been scheduled for a CT scan of the abdomen and pelvis at [facility], 1st floor Radiology. You are scheduled on Wednesday 08/22/21  at 2:30 pm. You should arrive 15 minutes prior to your appointment time for registration. The solution may taste better if refrigerated, but do NOT add ice or any other liquid to this solution. Shake well before drinking.   Please follow the written instructions below on the day of your exam:   1) Do not eat anything after 10:30 am (4 hours prior to your test)   2) Drink 1 bottle of contrast @ 12:30 pm (2 hours prior to your exam)  Remember to shake well before drinking and do NOT pour over ice.     Drink 1 bottle of contrast @ 1:30 pm (1 hour prior to your exam)   You may take any medications as prescribed with a small amount of water, if necessary. If you take any of the following medications: METFORMIN, GLUCOPHAGE, GLUCOVANCE, AVANDAMET, RIOMET, FORTAMET, [place] MET, JANUMET, GLUMETZA or METAGLIP, you MAY be asked to HOLD this medication 48 hours AFTER the exam.   The purpose of you drinking the oral contrast is to aid in the visualization of your intestinal tract. The contrast solution may cause some diarrhea. Depending on your individual set of symptoms, you may also receive an intravenous injection of x-ray contrast/dye. Plan on being at [facility] for 45 minutes or longer, depending on the type of exam you are having performed.   If you have any questions regarding your exam or if you need to reschedule, you may call [facility] at [phone] between the hours of 8:00 am and 5:00 pm, Monday-Friday.    If you are age 15 or older, your body mass index should be between 23-30. Your Body mass index is 31.19 kg/m. If this is out of the aforementioned range listed, please consider follow up with your Primary Care Provider.  If you are age 51 or younger, your body mass index should be between 19-25. Your Body mass index is 31.19 kg/m. If this is out of the aformentioned range listed, please consider follow up with your Primary Care Provider.   ________________________________________________________  The [facility] providers would like to encourage you to use [facility] to communicate with providers for non-urgent requests or questions.  Due to long hold times on the telephone, sending your provider a message by [facility] may be a faster and more efficient way to get a response.  Please allow 48 business hours for a response.  Please remember that this is for non-urgent requests.  _______________________________________________________

## 2021-08-16 ENCOUNTER — Other Ambulatory Visit: Payer: Self-pay | Admitting: Medical

## 2021-08-16 ENCOUNTER — Other Ambulatory Visit: Payer: Medicare Other

## 2021-08-16 DIAGNOSIS — R197 Diarrhea, unspecified: Secondary | ICD-10-CM | POA: Diagnosis not present

## 2021-08-16 DIAGNOSIS — R1084 Generalized abdominal pain: Secondary | ICD-10-CM | POA: Diagnosis not present

## 2021-08-16 DIAGNOSIS — R194 Change in bowel habit: Secondary | ICD-10-CM

## 2021-08-17 LAB — FECAL LACTOFERRIN, QUANT
Fecal Lactoferrin: POSITIVE — AB
MICRO NUMBER:: 13702943
SPECIMEN QUALITY:: ADEQUATE

## 2021-08-21 LAB — OVA AND PARASITE EXAMINATION
CONCENTRATE RESULT:: NONE SEEN
MICRO NUMBER:: 13702884
SPECIMEN QUALITY:: ADEQUATE
TRICHROME RESULT:: NONE SEEN

## 2021-08-22 ENCOUNTER — Ambulatory Visit (HOSPITAL_COMMUNITY)
Admission: RE | Admit: 2021-08-22 | Discharge: 2021-08-22 | Disposition: A | Discharge status: Home / Self Care | Payer: Medicare Other | Source: Ambulatory Visit | Attending: Physician Assistant | Admitting: Physician Assistant

## 2021-08-22 DIAGNOSIS — R194 Change in bowel habit: Secondary | ICD-10-CM | POA: Diagnosis not present

## 2021-08-22 DIAGNOSIS — R1084 Generalized abdominal pain: Secondary | ICD-10-CM | POA: Insufficient documentation

## 2021-08-22 DIAGNOSIS — N281 Cyst of kidney, acquired: Secondary | ICD-10-CM | POA: Diagnosis not present

## 2021-08-22 DIAGNOSIS — J9610 Chronic respiratory failure, unspecified whether with hypoxia or hypercapnia: Secondary | ICD-10-CM | POA: Diagnosis not present

## 2021-08-22 DIAGNOSIS — R197 Diarrhea, unspecified: Secondary | ICD-10-CM | POA: Diagnosis not present

## 2021-08-22 DIAGNOSIS — N3289 Other specified disorders of bladder: Secondary | ICD-10-CM | POA: Diagnosis not present

## 2021-08-22 DIAGNOSIS — J449 Chronic obstructive pulmonary disease, unspecified: Secondary | ICD-10-CM | POA: Diagnosis not present

## 2021-08-22 DIAGNOSIS — N261 Atrophy of kidney (terminal): Secondary | ICD-10-CM | POA: Diagnosis not present

## 2021-08-22 DIAGNOSIS — G4733 Obstructive sleep apnea (adult) (pediatric): Secondary | ICD-10-CM | POA: Diagnosis not present

## 2021-08-22 LAB — PANCREATIC ELASTASE, FECAL: Pancreatic Elastase-1, Stool: >500 mcg/g

## 2021-08-22 MED ORDER — IOHEXOL 300 MG/ML  SOLN
100.0000 mL | Freq: Once | INTRAMUSCULAR | Status: AC | PRN
  Administered 2021-08-22: 100 mL via INTRAVENOUS

## 2021-08-23 MED ORDER — CIPROFLOXACIN HCL 500 MG PO TABS: 500.0000 mg | ORAL_TABLET | Freq: Two times a day (BID) | ORAL | 0 refills | Status: AC

## 2021-08-23 MED ORDER — METRONIDAZOLE 500 MG PO TABS: 500.0000 mg | ORAL_TABLET | Freq: Three times a day (TID) | ORAL | 0 refills | Status: AC

## 2021-08-28 LAB — CALPROTECTIN: Calprotectin: 66 mcg/g

## 2021-09-03 ENCOUNTER — Ambulatory Visit: Payer: Medicare Other | Admitting: Medical

## 2021-09-03 VITALS — BP 118/64 | HR 117 | Temp 97.7°F | Resp 18 | Wt 236.0 lb

## 2021-09-03 DIAGNOSIS — I10 Essential (primary) hypertension: Secondary | ICD-10-CM | POA: Diagnosis not present

## 2021-09-03 DIAGNOSIS — Z794 Long term (current) use of insulin: Secondary | ICD-10-CM

## 2021-09-03 DIAGNOSIS — E118 Type 2 diabetes mellitus with unspecified complications: Secondary | ICD-10-CM

## 2021-09-03 DIAGNOSIS — J449 Chronic obstructive pulmonary disease, unspecified: Secondary | ICD-10-CM

## 2021-09-03 DIAGNOSIS — L989 Disorder of the skin and subcutaneous tissue, unspecified: Secondary | ICD-10-CM | POA: Diagnosis not present

## 2021-09-03 DIAGNOSIS — K5792 Diverticulitis of intestine, part unspecified, without perforation or abscess without bleeding: Secondary | ICD-10-CM | POA: Diagnosis not present

## 2021-09-03 DIAGNOSIS — E785 Hyperlipidemia, unspecified: Secondary | ICD-10-CM

## 2021-09-03 NOTE — Patient Instructions (Addendum)
Diabetes- continue metformin and check whether still on pioglitazone. Let me know.  Bp well controlled.  Copd- stay on current inhalers and 02 daily.  High cholesterol- continue atorvastatin.   Future cmp, lipid and a1c to be done September 14, 2021.  Refer back to GI MD. Continue current meds gi rx'd. Hopefully they will agree to virtual video visit.  Refer to dermatologist.  Follow up December 18, 2021 or sooner if needed.

## 2021-09-03 NOTE — Progress Notes (Signed)
Subjective:    Patient ID: Lance Powell, male    DOB: [birth year], 88 y.o.   MRN: [id number]  HPI Pt has diabetes. He is on metformin and pioglitazaone. Just recent diabetics eye exam. Lat a1c was 6.6 06/11/2021.      Pt has mild elevated triglycerides. Will repeat lipid panel today. Pt on atorvastatin 40 mg daily.   Bp well controlled today.   Copd hx. Pt on 02. Sees pulmonologist. On stioloto inhaler. Pt daughter states will get him scheduled.  Pt saw Dr. Nani Ravens on 07-17-2021. Pt was given Benefibar and referred to gi md. They did ct scan and found diverticulitis. Pt states his states he is semisolid/softer stools. He is happy with bowel pattern. No pain.   "Plan: 1.  Discussed with patient and his family there is a possibility that his Metformin is giving him diarrhea.  Told him to discuss further with his PCP at follow-up.  They could do a trial off of this medication and/or change agents. 2.  Patient cannot tolerate powder Cholestyramine.  We will change to Colestipol tablets to twice daily for now and can increase as tolerated.  Prescribed #120 with 1 refill. 3.  Continue Benefiber 4.  Ordered a CT of the abdomen pelvis with contrast for further evaluation. 5.  Ordered further stool studies to include O&P, fecal pancreatic elastase, calprotectin and lactoferrin. 6.  Discussed with the patient and his family that if everything above is unrevealing and he continues with diarrhea we could discuss a repeat colonoscopy, though I would like to avoid this given his age and increased risk and the fact that we would need to do this procedure in the hospital for him, likely an overnight stay given that he could likely not prep at home. 7.  Could also schedule out Imodium or Lomotil in the future to help. 8.  Patient to follow in clinic with me in 1 to 2 months or sooner as indicated by labs and imaging above"  After CT done they gave 7 days of cipro and flagyl   Review of Systems   Constitutional:  Negative for chills, diaphoresis and fatigue.  HENT:  Negative for congestion and drooling.   Respiratory:  Negative for cough, chest tightness, shortness of breath and wheezing.   Cardiovascular:  Negative for chest pain and palpitations.  Gastrointestinal:  Positive for constipation. Negative for abdominal pain.  Genitourinary:  Negative for dysuria, enuresis and flank pain.  Musculoskeletal:  Negative for gait problem.       Objective:   Physical Exam General Mental Status- Alert. General Appearance- Not in acute distress.   Skin Sk type lesions on upper back. Rt side forehead as well.  Neck Carotid Arteries- Normal color. Moisture- Normal Moisture. No carotid bruits. No JVD.  Chest and Lung Exam Auscultation: Breath Sounds:-Normal.  Cardiovascular Auscultation:Rythm- Regular. Murmurs & Other Heart Sounds:Auscultation of the heart reveals- No Murmurs.  Abdomen Inspection:-Inspeection Normal. Palpation/Percussion:Note:No mass. Palpation and Percussion of the abdomen reveal- Non Tender, Non Distended + BS, no rebound or guarding.   Neurologic Cranial Nerve exam:- CN III-XII intact(No nystagmus), symmetric smile. Strength:- 5/5 equal and symmetric strength both upper and lower extremities.        Assessment & Plan:   Patient Instructions  Diabetes- continue metformin and check whether still on pioglitazone. Let me know.  Bp well controlled.  Copd- stay on current inhalers and 02 daily.  High cholesterol- continue atorvastatin.   Future cmp, lipid and a1c to be  done September 14, 2021.  Refer back to GI MD. Continue current meds gi rx'd. Hopefully they will agree to virtual video visit.  Refer to dermatologist.  Follow up December 18, 2021 or sooner if needed.

## 2021-09-14 ENCOUNTER — Other Ambulatory Visit (INDEPENDENT_AMBULATORY_CARE_PROVIDER_SITE_OTHER): Payer: Medicare Other

## 2021-09-14 DIAGNOSIS — E118 Type 2 diabetes mellitus with unspecified complications: Secondary | ICD-10-CM

## 2021-09-14 DIAGNOSIS — Z794 Long term (current) use of insulin: Secondary | ICD-10-CM

## 2021-09-14 DIAGNOSIS — E785 Hyperlipidemia, unspecified: Secondary | ICD-10-CM

## 2021-09-14 DIAGNOSIS — I10 Essential (primary) hypertension: Secondary | ICD-10-CM

## 2021-09-14 NOTE — Addendum Note (Signed)
Addended by: Manuela Schwartz on: 09/14/2021 02:13 PM   Modules accepted: Orders

## 2021-09-15 LAB — COMPREHENSIVE METABOLIC PANEL
AG Ratio: 1.4 (calc) (ref 1.0–2.5)
ALT: 17 U/L (ref 9–46)
AST: 18 U/L (ref 10–35)
Albumin: 3.9 g/dL (ref 3.6–5.1)
Alkaline phosphatase (APISO): 70 U/L (ref 35–144)
BUN/Creatinine Ratio: 19 (calc) (ref 6–22)
BUN: 23 mg/dL (ref 7–25)
CO2: 28 mmol/L (ref 20–32)
Calcium: 10.1 mg/dL (ref 8.6–10.3)
Chloride: 104 mmol/L (ref 98–110)
Creat: 1.23 mg/dL — ABNORMAL HIGH (ref 0.70–1.22)
Globulin: 2.8 g/dL (calc) (ref 1.9–3.7)
Glucose, Bld: 119 mg/dL — ABNORMAL HIGH (ref 65–99)
Potassium: 4.4 mmol/L (ref 3.5–5.3)
Sodium: 144 mmol/L (ref 135–146)
Total Bilirubin: 0.5 mg/dL (ref 0.2–1.2)
Total Protein: 6.7 g/dL (ref 6.1–8.1)

## 2021-09-15 LAB — LIPID PANEL
Cholesterol: 107 mg/dL (ref <200)
HDL: 35 mg/dL — ABNORMAL LOW (ref >=40)
LDL Cholesterol (Calc): 55 mg/dL (calc)
Non-HDL Cholesterol (Calc): 72 mg/dL (calc) (ref <130)
Total CHOL/HDL Ratio: 3.1 (calc) (ref <5.0)
Triglycerides: 90 mg/dL (ref <150)

## 2021-09-15 LAB — HEMOGLOBIN A1C
Hgb A1c MFr Bld: 5.9 % of total Hgb — ABNORMAL HIGH (ref <5.7)
Mean Plasma Glucose: 123 mg/dL
eAG (mmol/L): 6.8 mmol/L

## 2021-09-19 DIAGNOSIS — L814 Other melanin hyperpigmentation: Secondary | ICD-10-CM | POA: Diagnosis not present

## 2021-09-19 DIAGNOSIS — D1801 Hemangioma of skin and subcutaneous tissue: Secondary | ICD-10-CM | POA: Diagnosis not present

## 2021-09-19 DIAGNOSIS — L821 Other seborrheic keratosis: Secondary | ICD-10-CM | POA: Diagnosis not present

## 2021-09-19 DIAGNOSIS — L820 Inflamed seborrheic keratosis: Secondary | ICD-10-CM | POA: Diagnosis not present

## 2021-09-25 ENCOUNTER — Encounter: Payer: Self-pay | Admitting: Emergency Medicine

## 2021-09-25 ENCOUNTER — Ambulatory Visit: Payer: Medicare Other | Admitting: Emergency Medicine

## 2021-09-25 DIAGNOSIS — J9611 Chronic respiratory failure with hypoxia: Secondary | ICD-10-CM | POA: Diagnosis not present

## 2021-09-25 DIAGNOSIS — J438 Other emphysema: Secondary | ICD-10-CM

## 2021-09-25 DIAGNOSIS — G4733 Obstructive sleep apnea (adult) (pediatric): Secondary | ICD-10-CM | POA: Diagnosis not present

## 2021-09-25 NOTE — Assessment & Plan Note (Signed)
We will continue your inhaled medications as you have been taking them: -Your maintenance, every day inhaler is Stiolto.  Use 2 puffs once daily every day on a schedule -Your rescue inhaler to use as needed is albuterol.  You can use 2 puffs when you need it for shortness of breath, chest tightness, wheezing. Follow with APP in 6 months Follow Dr. Lamonte Sakai in 12 months or sooner if you have any problems.

## 2021-09-25 NOTE — Assessment & Plan Note (Signed)
We requested a new CPAP machine in May because his filter cap is broken and [employer] is unable to replace it.  The having her anything yet about replacement.  We will work on this today with his DME.

## 2021-09-25 NOTE — Assessment & Plan Note (Signed)
Please continue your oxygen at all times 2-4 L/min depending on your level of exertion.

## 2021-09-25 NOTE — Patient Instructions (Addendum)
We will continue your inhaled medications as you have been taking them: -Your maintenance, every day inhaler is Stiolto.  Use 2 puffs once daily every day on a schedule -Your rescue inhaler to use as needed is albuterol.  You can use 2 puffs when you need it for shortness of breath, chest tightness, wheezing. Please continue your oxygen at all times 2-4 L/min depending on your level of exertion. We will continue your CPAP every night.  We will work on getting you a new machine since your filter is no longer working. Follow with APP in 6 months Follow Dr. Lamonte Sakai in 12 months or sooner if you have any problems.

## 2021-09-25 NOTE — Progress Notes (Signed)
Subjective:    Patient ID: Lance Powell, male    DOB: Apr 29, 1929, 88 y.o.   MRN: [id number]  HPI  ROV 03/14/21 --follow-up visit for 88 year old gentleman with a history of chronic hypoxemic respiratory failure in the setting of severe COPD, OSA on CPAP. PMH: Diabetes, hyperlipidemia, hypertension, GERD, rheumatoid arthritis, allergic rhinitis We had planned to change his Stiolto to Susan Moore at his last visit in October because he gets better delivery with a spacer.  He did not get the bevespi, apparently due to cost. He has stiolto, is not using it reliably (barely at all). I think this is due to memory and confusion about the meds.  There was a prior authorization for bevespi that was accepted and confirmed 11/09/2020 from [employer] > we will check on the cost.  Also titrated his oxygen to 2 L/min at rest and 4 L/min with exertion.  He is compliant with his CPAP + O2 He believes that his breathing is ok when he is around the house - he is very sedentary.  He has cough at night, productive of gray to yellow.  ROV 09/25/21 --[name] is 15 with a history of severe COPD, OSA on CPAP and associated chronic hypoxemic respiratory failure.  He also has diabetes, hyperlipidemia, hypertension, rheumatoid arthritis, GERD, allergic rhinitis. We have been managing him on oxygen 2-4 L/min depending on level of exertion.  He is compliant with CPAP nightly. He was seen recently for diverticulitis, was rx w abx 3-4 wks ago. Daily cough, some mucous  Now on Stiolto.  Using albuterol approximately  . He wears his CPAP reliably. Need [facility] to get [place] new CPAP machine - one of the filter caps is not working.  Remains on fluticasone nasal spray, Mucinex twice daily          No data to display          Review of Systems As per HPI     Objective:   Physical Exam Vitals:   09/25/21 1539  BP: 128/76  Pulse: (!) 111  Temp: 98.3 F (36.8 C)  TempSrc: Oral  SpO2: 93%  Weight: 234 lb (106.1  kg)  Height: 6' (1.829 m)    Gen: Pleasant, overwt man, in no distress,  normal affect  ENT: No lesions,  mouth clear,  oropharynx clear, no postnasal drip  Neck: No JVD, no stridor  Lungs: No use of accessory muscles, very distant, clear on a normal breath, no wheezing  Cardiovascular: RRR, heart sounds normal, no murmur or gallops, no peripheral edema  Musculoskeletal: No deformities  Neuro: alert, awake, poor memory. Re-orients easily  Skin: Warm, no lesions or rashes      Assessment & Plan:  COPD (chronic obstructive pulmonary disease) (HCC) We will continue your inhaled medications as you have been taking them: -Your maintenance, every day inhaler is Stiolto.  Use 2 puffs once daily every day on a schedule -Your rescue inhaler to use as needed is albuterol.  You can use 2 puffs when you need it for shortness of breath, chest tightness, wheezing. Follow with APP in 6 months Follow Dr. Lamonte Sakai in 12 months or sooner if you have any problems.  Obstructive sleep apnea We requested a new CPAP machine in May because his filter cap is broken and [employer] is unable to replace it.  The having her anything yet about replacement.  We will work on this today with his DME.  Chronic respiratory failure (HCC) Please continue your oxygen at  all times 2-4 L/min depending on your level of exertion.    Lance Apo, MD, PhD 09/25/2021, 3:54 PM [facility] [phone] or if no answer [phone]

## 2021-10-02 ENCOUNTER — Telehealth: Payer: Self-pay | Admitting: Emergency Medicine

## 2021-10-02 NOTE — Telephone Encounter (Signed)
Spoke with patient regarding scheduling CPAP Compliance appointment between 11-02-2021 and 12-31-2021. Patient states he will call back tomorrow to schedule.

## 2021-10-08 ENCOUNTER — Telehealth: Payer: Self-pay | Admitting: Medical

## 2021-10-08 MED ORDER — CELECOXIB 200 MG PO CAPS: ORAL_CAPSULE | ORAL | 1 refills | Status: DC

## 2021-10-08 NOTE — Telephone Encounter (Signed)
Daughter, Lance Powell, calling to follow up on refill request for celecoxib (CELEBREX) 200 MG capsule. [facility] contacted [place] regarding refill request but have not heard back. Please call daughter, Lance Powell to advise.

## 2021-10-08 NOTE — Telephone Encounter (Signed)
Rx sent 

## 2021-10-10 ENCOUNTER — Encounter: Payer: Self-pay | Admitting: Physician Assistant

## 2021-10-10 ENCOUNTER — Ambulatory Visit: Payer: Medicare Other | Admitting: Physician Assistant

## 2021-10-10 VITALS — BP 100/58 | HR 110 | Ht 72.0 in | Wt 233.8 lb

## 2021-10-10 DIAGNOSIS — K529 Noninfective gastroenteritis and colitis, unspecified: Secondary | ICD-10-CM

## 2021-10-10 NOTE — Patient Instructions (Signed)
_______________________________________________________  If you are age 86 or older, your body mass index should be between 23-30. Your Body mass index is 31.71 kg/m. If this is out of the aforementioned range listed, please consider follow up with your Primary Care Provider.  If you are age 52 or younger, your body mass index should be between 19-25. Your Body mass index is 31.71 kg/m. If this is out of the aformentioned range listed, please consider follow up with your Primary Care Provider.   ________________________________________________________  The [facility] providers would like to encourage you to use [facility] to communicate with providers for non-urgent requests or questions.  Due to long hold times on the telephone, sending your provider a message by [facility] may be a faster and more efficient way to get a response.  Please allow 48 business hours for a response.  Please remember that this is for non-urgent requests.  _______________________________________________________  Increase your fiber to twice a day for one week.  If this is not helpful increase to three times a day.  If this does not help, contact Anderson Malta to let her know.  You will need to follow up in about three months

## 2021-10-10 NOTE — Progress Notes (Signed)
Chief Complaint: Follow-up chronic diarrhea and recent diverticulitis  HPI:     [name] is an 88 year old male, known to Dr. Fuller Plan, with a past medical history of COPD, diabetes, GERD, rheumatoid arthritis and multiple others listed below, who returns to clinic today for follow-up of his chronic diarrhea and recent diverticulitis.    12/27/2011 colonoscopy with a 5 mm sessile polyp in the descending colon and moderate diverticulosis and otherwise normal.  Pathology showed inflammatory polyp.    01/07/2012 flex sigmoidoscopy for suspected diverticular bleed showed a resolving diverticular bleed.    06/11/2021 CMP normal other than elevated glucose at 120.  Hemoglobin A1c 6.6.    07/11/2021 office visit with patient's PCP for worsening diarrhea.  Apparently going on for the past 6 months but worsening over the past month.  At that time started Questran 4 g packet 3 times daily and patient was referred here.    07/12/2021 GI profile panel negative.    08/13/2021 patient presented to clinic accompanied by his daughters and described that for the past 6 to 8 months he would have diarrhea at least 3 times a week 2-3 times a day.  At that time was taking Benefiber and Cholestyramine.  Also describes some weight loss.  At that time discussed possibility that Metformin is giving him diarrhea.  He described he cannot tolerate the powdered cholestyramine so we changed Colestipol tablets with plans to increase as tolerated.  Also ordered a CT the abdomen pelvis with contrast for further evaluation.  Stool studies including O&P, fecal pancreatic elastase, calprotectin and lactoferrin.    08/16/2021 fecal lactoferrin positive, O&P negative, calprotectin normal, fecal pancreatic elastase normal and GI profile panel negative.    08/22/2021 CT the abdomen pelvis with contrast showed diffuse colonic diverticulosis with questionable mild pericolonic fat stranding involving the distal descending colon and proximal sigmoid  colon which could represent possible mild diverticulitis.  Also multiple ventral abdominal wall hernias containing nonobstructed bowel.  Question of partial small bowel obstruction.  Right renal cyst and stone, bile lateral bladder diverticula, stable partially calcified mass in T11/T12 interspace and aortic atherosclerosis.  At that time prescribed Ciprofloxacin 500 twice daily and Flagyl 500 3 times daily x7 days.    Today, patient presents to clinic accompanied by his daughter who does assist in his care.  He explains that since being on antibiotics (which he really does not remember) his stools are no longer diarrhea and urgent.  He typically has 2 bowel movements a day and they look more like the consistency of "toothpaste", his only problem with this is that it takes him longer to clean up after a bowel movement and he is using "more toilet paper".  Tells me he continues Colestipol 2 tabs twice a day and recently stopped his fiber as he thought this may be adding to his looser stools.  He has gained 10 pounds since last visit.    Denies fever, chills, weight loss, abdominal pain, blood in the stools, nausea, vomiting, heartburn or reflux.     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   COPD (chronic obstructive pulmonary disease) ([place])    Depression 03/08/2014   Diabetes mellitus    GERD (gastroesophageal reflux disease) 03/08/2014   History of blood transfusion    Hyperlipemia    Hypertension    OSA (obstructive sleep apnea)    Osteoarthritis    Rheumatoid arthritis(714.0)    Sleep apnea  cpap    Past Surgical History:  Procedure Laterality Date   APPENDECTOMY  1969   CHOLECYSTECTOMY N/A 06/16/2014   Procedure: LAPAROSCOPIC CHOLECYSTECTOMY;  Surgeon: Greer Pickerel, MD;  Location: [facility];  Service: General;  Laterality: N/A;   CYSTOSCOPY  01/23/2011   Procedure: CYSTOSCOPY;  Surgeon: Molli Hazard, MD;  Location: [facility];  Service: Urology;  Laterality: N/A;   FLEXIBLE SIGMOIDOSCOPY  01/07/2012   Procedure: FLEXIBLE SIGMOIDOSCOPY;  Surgeon: Ladene Artist, MD,FACG;  Location: [facility];  Service: Endoscopy;  Laterality: N/A;   HERNIA REPAIR  1990   right and left inguinal   NISSEN FUNDOPLICATION     SIGMOIDECTOMY  2004   colovesical fistula   TONSILLECTOMY  1937   TRANSURETHRAL RESECTION OF PROSTATE  01/23/2011   Procedure: TRANSURETHRAL RESECTION OF THE PROSTATE WITH GYRUS INSTRUMENTS;  Surgeon: Molli Hazard, MD;  Location: [facility];  Service: Urology;  Laterality: N/A;   VASECTOMY  1972    Current Outpatient Medications  Medication Sig Dispense Refill   aspirin 81 MG tablet Take 1 tablet (81 mg total) by mouth every morning. Stop for 1 week and then resume (Patient taking differently: Take 81 mg by mouth every morning.)     atorvastatin (LIPITOR) 40 MG tablet TAKE 1 TABLET BY MOUTH EVERY DAY 90 tablet 3   celecoxib (CELEBREX) 200 MG capsule TAKE 1 CAPSULE(200 MG) BY MOUTH DAILY AS NEEDED FOR MODERATE PAIN 30 capsule 1   Cholecalciferol (VITAMIN D) 50 MCG (2000 UT) tablet Take 2,000 Units by mouth 2 (two) times daily.     colestipol (COLESTID) 1 g tablet Take 2 tablets (2 g total) by mouth 2 (two) times daily. 180 tablet 3   CONTOUR NEXT TEST test strip USE AS DIRECTED TO CHECK BLOOD SUGAR TWICE DAILY 100 strip 2   Glycopyrrolate-Formoterol (BEVESPI AEROSPHERE) 9-4.8 MCG/ACT AERO Inhale 2 puffs into the lungs 2 (two) times daily. (Patient not taking: Reported on 09/25/2021) 10.7 g 6   Indacaterol Maleate (ARCAPTA NEOHALER) 75 MCG CAPS Place 75 capsules into inhaler and inhale daily.     metFORMIN (GLUCOPHAGE) 500 MG tablet TAKE 1 TABLET(500 MG) BY MOUTH THREE TIMES DAILY 90 tablet 3   misoprostol (CYTOTEC) 100 MCG tablet TAKE 1 TABLET BY MOUTH TWICE DAILY AND 2 TABLETS AT BEDTIME (Patient taking differently: Take 100-200 mcg by mouth See admin instructions. Take 1 tablet by  mouth twice a day and 2 tablets at bedtime) 360 tablet 3   Multiple Vitamins-Minerals (CENTRUM) tablet Take 1 tablet by mouth every morning. Reported on 01/05/2015     OVER THE COUNTER MEDICATION Place 1-2 drops into both eyes daily as needed (dry red eyes.). Reported on 01/05/2015     pioglitazone (ACTOS) 15 MG tablet TAKE 1 TABLET(15 MG) BY MOUTH DAILY (Patient taking differently: Take 15 mg by mouth daily at 12 noon.) 90 tablet 3   psyllium (METAMUCIL) 58.6 % packet Take 1 packet by mouth daily at 12 noon.     Spacer/Aero-Holding Chambers DEVI 1 Product by Does not apply route daily. 1 Product 0   Tiotropium Bromide-Olodaterol (STIOLTO RESPIMAT) 2.5-2.5 MCG/ACT AERS Inhale 2 puffs into the lungs daily. 4 g 5   UNABLE TO FIND CPAP     venlafaxine XR (EFFEXOR-XR) 37.5 MG 24 hr capsule TAKE 1 CAPSULE(37.5 MG) BY MOUTH DAILY WITH BREAKFAST 90 capsule 3   vitamin C (ASCORBIC ACID) 500 MG tablet Take 500 mg by mouth every morning.  Zoster Vaccine Adjuvanted [facility]) injection Inject into the muscle. 1 each 1   No current facility-administered medications for this visit.    Allergies as of 10/10/2021   (No Known Allergies)    Family History  Problem Relation Age of Onset   Heart disease Mother    Diabetes Mother    Emphysema Brother    Cancer Daughter        breast   Stomach cancer Neg Hx    Esophageal cancer Neg Hx    Colon cancer Neg Hx     Social History   Socioeconomic History   Marital status: Widowed    Spouse name: Not on file   Number of children: 6   Years of education: Not on file   Highest education level: Not on file  Occupational History   Occupation: Retired    Comment: Chief Financial Officer   Occupation: retird  Tobacco Use   Smoking status: Former    Packs/day: 4.00    Years: 30.00    Total pack years: 120.00    Types: Cigarettes    Quit date: 01/21/1978    Years since quitting: 43.7   Smokeless tobacco: Never  Vaping Use   Vaping Use: Never used  Substance  and Sexual Activity   Alcohol use: No   Drug use: No   Sexual activity: Not on file  Other Topics Concern   Not on file  Social History Narrative   Right Handed   Lives in a one story home   Drinks no caffeine    Social Determinants of Health   Financial Resource Strain: Medium Risk (12/31/2020)   Overall Financial Resource Strain (CARDIA)    Difficulty of Paying Living Expenses: Somewhat hard  Food Insecurity: No Food Insecurity (03/02/2021)   Hunger Vital Sign    Worried About Running Out of Food in the Last Year: Never true    [place] in the Last Year: Never true  Transportation Needs: No Transportation Needs (03/02/2021)   PRAPARE - Hydrologist (Medical): No    Lack of Transportation (Non-Medical): No  Physical Activity: Inactive (03/02/2021)   Exercise Vital Sign    Days of Exercise per Week: 0 days    Minutes of Exercise per Session: 0 min  Stress: No Stress Concern Present (03/02/2021)   [place]    Feeling of Stress : Not at all  Social Connections: Socially Isolated (03/02/2021)   Social Connection and Isolation Panel [NHANES]    Frequency of Communication with Friends and Family: More than three times a week    Frequency of Social Gatherings with Friends and Family: More than three times a week    Attends Religious Services: Never    Marine scientist or Organizations: No    Attends Archivist Meetings: Never    Marital Status: Widowed  Human resources officer Violence: Not on file    Review of Systems:    Constitutional: No weight loss, fever or chills Cardiovascular: No chest pain Respiratory: +chronic SOB  Gastrointestinal: See HPI and otherwise negative   Physical Exam:  Vital signs: BP (!) 100/58 (BP Location: Right Arm, Patient Position: Sitting, Cuff Size: Normal)   Pulse (!) 110   Ht 6' (1.829 m)   Wt 233 lb 12.8 oz (106.1 kg)   SpO2 91%    BMI 31.71 kg/m    Constitutional:   Pleasant elderly Caucasian male appears to be  in NAD, Well developed, Well nourished, alert and cooperative Respiratory: Respirations even and unlabored. Lungs clear to auscultation bilaterally.   No wheezes, crackles, or rhonchi. +on O2 via [place] Cardiovascular: Normal S1, S2. No MRG. Regular rate and rhythm. No peripheral edema, cyanosis or pallor.  Gastrointestinal:  Soft, nondistended, nontender. No rebound or guarding. Normal bowel sounds. No appreciable masses or hepatomegaly. +ventral hernia, non tender, reducible Rectal:  Not performed.  Psychiatric: Oriented to person, place and time. Demonstrates good judgement and reason without abnormal affect or behaviors.  RELEVANT LABS AND IMAGING: CBC    Component Value Date/Time   WBC 9.0 05/06/2021 1844   RBC 5.28 05/06/2021 1844   HGB 16.7 05/06/2021 1844   HCT 52.1 (H) 05/06/2021 1844   PLT 226 05/06/2021 1844   MCV 98.7 05/06/2021 1844   MCH 31.6 05/06/2021 1844   MCHC 32.1 05/06/2021 1844   RDW 14.1 05/06/2021 1844   LYMPHSABS 2.1 05/06/2021 1844   MONOABS 0.7 05/06/2021 1844   EOSABS 0.2 05/06/2021 1844   BASOSABS 0.0 05/06/2021 1844    CMP     Component Value Date/Time   NA 144 09/14/2021 1414   K 4.4 09/14/2021 1414   CL 104 09/14/2021 1414   CO2 28 09/14/2021 1414   GLUCOSE 119 (H) 09/14/2021 1414   BUN 23 09/14/2021 1414   CREATININE 1.23 (H) 09/14/2021 1414   CALCIUM 10.1 09/14/2021 1414   PROT 6.7 09/14/2021 1414   ALBUMIN 4.3 06/11/2021 1520   AST 18 09/14/2021 1414   ALT 17 09/14/2021 1414   ALKPHOS 81 06/11/2021 1520   BILITOT 0.5 09/14/2021 1414   GFRNONAA 59 (L) 05/06/2021 1844   GFRNONAA 44 (L) 04/25/2016 1244   GFRAA >60 09/14/2018 1657   GFRAA 51 (L) 04/25/2016 1244    Assessment: 1.  Chronic diarrhea: Better now since antibiotics, Cipro and Flagyl for diverticulitis seen at time of CT, now more of a toothpaste consistency, not really a problem for the patient  other than cleaning up 2.  Weight loss: Patient is gained 10 pounds since last visit  Plan: 1.  At this time recommend that patient start back on his fiber and in fact increase to twice daily dosing for the next week.  If this does not help consistency of stools then increase to 3 times a day for a week.  If this does not help then we will consider adjusting Colestipol tabs. 2.  For now continue Colestipol 2 tabs twice a day.  As above if fiber not helping could increase to 3 tabs twice a day 3.  Patient to follow in clinic with me in 2 to 3 months or sooner if necessary.  [name], PA-C [facility] 10/10/2021, 11:17 AM  Cc: [name], PA-C

## 2021-11-02 ENCOUNTER — Telehealth: Payer: Self-pay

## 2021-11-02 NOTE — Telephone Encounter (Signed)
Spoke with pt's daughter [name] (per DPR) and set up C-Pap compliance OV on 11/19/21 with Roxan Diesel

## 2021-11-02 NOTE — Telephone Encounter (Signed)
Patient is scheduled for OV on 10/30.  Nothing further needed.

## 2021-11-13 DIAGNOSIS — H02135 Senile ectropion of left lower eyelid: Secondary | ICD-10-CM | POA: Diagnosis not present

## 2021-11-13 DIAGNOSIS — H02115 Cicatricial ectropion of left lower eyelid: Secondary | ICD-10-CM | POA: Diagnosis not present

## 2021-11-13 DIAGNOSIS — H02112 Cicatricial ectropion of right lower eyelid: Secondary | ICD-10-CM | POA: Diagnosis not present

## 2021-11-13 DIAGNOSIS — H02132 Senile ectropion of right lower eyelid: Secondary | ICD-10-CM | POA: Diagnosis not present

## 2021-11-19 ENCOUNTER — Ambulatory Visit: Payer: Medicare Other | Admitting: Nurse Practitioner

## 2021-11-28 ENCOUNTER — Telehealth: Payer: Self-pay | Admitting: Medical

## 2021-11-28 NOTE — Telephone Encounter (Signed)
Jenna with [facility] called to follow up on medical clearance. Patient is having surgery next week but they have not received paperwork back yet. It was faxed 10/31. Please call her to advise [phone]

## 2021-11-28 NOTE — Telephone Encounter (Signed)
Pt called and appt made for surgical clearance

## 2021-11-29 ENCOUNTER — Ambulatory Visit: Payer: Medicare Other | Admitting: Nurse Practitioner

## 2021-11-29 ENCOUNTER — Encounter: Payer: Self-pay | Admitting: Nurse Practitioner

## 2021-11-29 VITALS — BP 100/60 | HR 112 | Ht 72.0 in | Wt 233.0 lb

## 2021-11-29 DIAGNOSIS — G4733 Obstructive sleep apnea (adult) (pediatric): Secondary | ICD-10-CM | POA: Diagnosis not present

## 2021-11-29 DIAGNOSIS — J9611 Chronic respiratory failure with hypoxia: Secondary | ICD-10-CM

## 2021-11-29 DIAGNOSIS — J438 Other emphysema: Secondary | ICD-10-CM | POA: Diagnosis not present

## 2021-11-29 MED ORDER — ALBUTEROL SULFATE HFA 108 (90 BASE) MCG/ACT IN AERS: 2.0000 | INHALATION_SPRAY | Freq: Four times a day (QID) | RESPIRATORY_TRACT | 2 refills | Status: DC | PRN

## 2021-11-29 NOTE — Assessment & Plan Note (Signed)
Stable on 2-4 lpm. No increased requirement. Goal >88-90%

## 2021-11-29 NOTE — Progress Notes (Signed)
$'@Patient'j$  ID: Lance Powell, male    DOB: 1929/02/22, 88 y.o.   MRN: [id number]  Chief Complaint  Patient presents with   Follow-up    Pt f/u for OSA, the new machine is working well only concern is insurance    Referring provider: [name], [name], PA-C  HPI: 88 year old male, former smoker (120 pack years) followed for COPD with emphysema, chronic respiratory failure and OSA.  He is a patient of Dr. Agustina Caroli and last seen in office on 09/25/2021.  Past medical history significant for dementia, hypertension, allergic rhinitis, diverticulitis, GERD, DM2, OA, HLD, RA, depression, ED.  TEST/EVENTS:  01/11/2011 PFT: FVC 83, FEV1 64, ratio 76, TLC 108, DLCOunc 46. Post bronchodilator not obtained  03/14/2021: OV with Dr. Lamonte Sakai.  Daughter accompanied patient.  Previously managed on Stiolto but reported that he is not taking it reliably.  Feels like he is still having some shortness of breath and persistent cough.  Discussed changing from Stiolto to [place] and use with spacer.  Dependent on cost of medications.  Continue supplemental oxygen therapy at 2 L/min at rest and 4 L/min with exertion.  Continued CPAP at night.  Daughter voiced concerns about father safety at home.  Discussed that he forgets to use his oxygen and does not take his medications reliably.  He does have some impulsive angry behaviors.  Was referred to neuropsych.  Daughter called back and requested that this was canceled as her father and family did not wish to proceed any further with this.  04/04/2021: OV with Algenis Ballin NP for follow-up.  He has been very consistent with Stiolto use and has been taking it at 7 PM every night.  He has only been doing 1 puff daily but feels as though his breathing is better and his cough has significantly improved.  It was initially more productive with yellow sputum but this has cleared and he has a minimal, primarily nonproductive cough.  They report that the Bevespi was significantly more expensive  than the Stiolto was.  He pays $100 a month for Stiolto which is affordable to them.  They do not wish to switch to the Bevespi at this time, especially since he feels like it is not working since he has increased his compliance.  He states that he has been wearing his oxygen as he is supposed to.  He also states that he wears his CPAP every night without difficulties.  He does continue to live relatively sedentary lifestyle and spends a lot of time resting in his chair. Continued on Stiolto. Exercise encouraged. Significant breakthrough events and leaks on CPAP - sent for mask fitting.  09/25/2021: OV with Dr. Lamonte Sakai. Stable on Stiolto. Recently treated for diverticulitis with abx 3-4 weeks prior. Daily chronic cough. Wears CPAP reliably; needs to get a new machine as his is broken and [employer] said it cannot be fixed.   11/29/2021: Today - follow up Patient presents today for follow-up with his daughter.  He has been doing well since he was here last.  He did have a little bit of confusion regarding his inhalers.  He is using Stiolto daily and then using Bevespi as rescue.  Not using it very often; maybe 1-2 times a week. The daughter is not entirely sure if he has an albuterol inhaler at home.  Thinks there may be one there but would like a new prescription for this. Cough is at his baseline. No increased congestion. Continue on supplemental oxygen 2-4 lpm; 2 today on  POC. He is wearing his CPAP nightly with full face mask. He did finally get a new machine; insurance is apparently giving them a difficult time about paying for it because he wasn't due for a new machine (not within the 5 year window). However, his machine had broken and was not fixable, per his DME company.   10/29/2021-11/27/2021: CPAP 11 cmH2O 30/30 days used; 100% greater than 4 hours; average use 14 hours 23 minutes Leaks median 88.6, 95th 120 AHI 14.1  No Known Allergies  Immunization History  Administered Date(s) Administered   Fluad  Quad(high Dose 65+) 10/16/2018, 11/03/2019, 10/11/2020   Influenza Split 11/11/2010, 10/22/2011   Influenza Whole 01/22/2008, 11/22/2009   Influenza, High Dose Seasonal PF 09/27/2015, 11/04/2016, 10/22/2017   Influenza, Seasonal, Injecte, Preservative Fre 10/09/2010   Influenza,inj,Quad PF,6+ Mos 10/19/2012, 11/03/2013, 11/10/2014   Influenza-Unspecified 11/21/2013   Moderna Sars-Covid-2 Vaccination 02/23/2019, 03/24/2019   Pfizer Covid-19 Vaccine Bivalent Booster 86yr & up 10/11/2020   Pneumococcal Conjugate-13 02/15/2014, 03/27/2015   Pneumococcal Polysaccharide-23 10/31/2006, 01/22/2007   Tdap 03/27/2015   Zoster Recombinat (Shingrix) 03/05/2021    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   COPD (chronic obstructive [place] disease) [place]    Depression 03/08/2014   Diabetes mellitus    GERD (gastroesophageal reflux disease) 03/08/2014   History of blood transfusion    Hyperlipemia    Hypertension    OSA (obstructive sleep apnea)    Osteoarthritis    Rheumatoid arthritis(714.0)    Sleep apnea    cpap    Tobacco History: Social History   Tobacco Use  Smoking Status Former   Packs/day: 4.00   Years: 30.00   Total pack years: 120.00   Types: Cigarettes   Quit date: 01/21/1978   Years since quitting: 43.8  Smokeless Tobacco Never   Counseling given: Not Answered   Outpatient Medications Prior to Visit  Medication Sig Dispense Refill   aspirin 81 MG tablet Take 1 tablet (81 mg total) by mouth every morning. Stop for 1 week and then resume (Patient taking differently: Take 81 mg by mouth every morning.)     atorvastatin (LIPITOR) 40 MG tablet TAKE 1 TABLET BY MOUTH EVERY DAY 90 tablet 3   celecoxib (CELEBREX) 200 MG capsule TAKE 1 CAPSULE(200 MG) BY MOUTH DAILY AS NEEDED FOR MODERATE PAIN 30 capsule 1   Cholecalciferol (VITAMIN D) 50 MCG (2000 UT) tablet Take 2,000 Units by mouth 2 (two) times daily.     colestipol (COLESTID)  1 g tablet Take 2 tablets (2 g total) by mouth 2 (two) times daily. 180 tablet 3   CONTOUR NEXT TEST test strip USE AS DIRECTED TO CHECK BLOOD SUGAR TWICE DAILY 100 strip 2   metFORMIN (GLUCOPHAGE) 500 MG tablet TAKE 1 TABLET(500 MG) BY MOUTH THREE TIMES DAILY 90 tablet 3   misoprostol (CYTOTEC) 100 MCG tablet TAKE 1 TABLET BY MOUTH TWICE DAILY AND 2 TABLETS AT BEDTIME (Patient taking differently: Take 100-200 mcg by mouth See admin instructions. Take 1 tablet by mouth twice a day and 2 tablets at bedtime) 360 tablet 3   Multiple Vitamins-Minerals (CENTRUM) tablet Take 1 tablet by mouth every morning. Reported on 01/05/2015     OVER THE COUNTER MEDICATION Place 1-2 drops into both eyes daily as needed (dry red eyes.). Reported on 01/05/2015     pioglitazone (ACTOS) 15 MG tablet TAKE 1 TABLET(15 MG) BY MOUTH DAILY (Patient taking differently: Take 15 mg by  mouth daily at 12 noon.) 90 tablet 3   psyllium (METAMUCIL) 58.6 % packet Take 1 packet by mouth daily at 12 noon.     Spacer/Aero-Holding Chambers DEVI 1 Product by Does not apply route daily. 1 Product 0   Tiotropium Bromide-Olodaterol (STIOLTO RESPIMAT) 2.5-2.5 MCG/ACT AERS Inhale 2 puffs into the lungs daily. 4 g 5   UNABLE TO FIND CPAP     venlafaxine XR (EFFEXOR-XR) 37.5 MG 24 hr capsule TAKE 1 CAPSULE(37.5 MG) BY MOUTH DAILY WITH BREAKFAST 90 capsule 3   vitamin C (ASCORBIC ACID) 500 MG tablet Take 500 mg by mouth every morning.      Wheat Dextrin (BENEFIBER DRINK MIX PO) Take 1 Dose by mouth daily.     Zoster Vaccine Adjuvanted [facility]) injection Inject into the muscle. 1 each 1   Glycopyrrolate-Formoterol (BEVESPI AEROSPHERE) 9-4.8 MCG/ACT AERO Inhale 2 puffs into the lungs 2 (two) times daily. 10.7 g 6   Indacaterol Maleate (ARCAPTA NEOHALER) 75 MCG CAPS Place 75 capsules into inhaler and inhale daily.     No facility-administered medications prior to visit.     Review of Systems:   Constitutional: No weight loss or gain, night  sweats, fevers, chills, fatigue, or lassitude. HEENT: No headaches, difficulty swallowing, tooth/dental problems, or sore throat. No sneezing, itching, ear ache, nasal congestion, or post nasal drip CV:  No chest pain, orthopnea, PND, swelling in lower extremities, anasarca, dizziness, palpitations, syncope Resp: +shortness of breath with exertion (baseline); chronic cough, minimal sputum production. No excess mucus or change in color of mucus.  No hemoptysis. No wheezing.  No chest wall deformity GI: No heartburn, indigestion, abd pain, N/V, diarrhea Neuro: No dizziness or lightheadedness.  Psych: No depression or anxiety. Mood stable.     Physical Exam:  BP 100/60   Pulse (!) 112   Ht 6' (1.829 m)   Wt 233 lb (105.7 kg)   SpO2 93% Comment: 3L  BMI 31.60 kg/m   GEN: Pleasant, interactive, chronically-ill appearing; elderly; obese; in no acute distress. HEENT:  Normocephalic and atraumatic. PERRLA. Sclera white. Nasal turbinates pink, moist and patent bilaterally. No rhinorrhea present. Oropharynx pink and moist, without exudate or edema. No lesions, ulcerations, or postnasal drip.  NECK:  Supple w/ fair ROM. No JVD present. Normal carotid impulses w/o bruits. Thyroid symmetrical with no goiter or nodules palpated. No lymphadenopathy.   CV: RRR, no m/r/g, no peripheral edema. Pulses intact, +2 bilaterally. No cyanosis, pallor or clubbing. PULMONARY:  Unlabored, regular breathing. Clear bilaterally A&P w/o wheezes/rales/rhonchi. No accessory muscle use. No dullness to percussion. GI: BS present and normoactive. Soft, non-tender to palpation. MSK: no deformities noted. Cap refill <2 seconds. Skin: warm. No lesions or rashes Neuro: A/Ox3. Short term memory impairment noted.     Lab Results:  CBC    Component Value Date/Time   WBC 9.0 05/06/2021 1844   RBC 5.28 05/06/2021 1844   HGB 16.7 05/06/2021 1844   HCT 52.1 (H) 05/06/2021 1844   PLT 226 05/06/2021 1844   MCV 98.7  05/06/2021 1844   MCH 31.6 05/06/2021 1844   MCHC 32.1 05/06/2021 1844   RDW 14.1 05/06/2021 1844   LYMPHSABS 2.1 05/06/2021 1844   MONOABS 0.7 05/06/2021 1844   EOSABS 0.2 05/06/2021 1844   BASOSABS 0.0 05/06/2021 1844    BMET    Component Value Date/Time   NA 144 09/14/2021 1414   K 4.4 09/14/2021 1414   CL 104 09/14/2021 1414   CO2 28 09/14/2021 1414  GLUCOSE 119 (H) 09/14/2021 1414   BUN 23 09/14/2021 1414   CREATININE 1.23 (H) 09/14/2021 1414   CALCIUM 10.1 09/14/2021 1414   GFRNONAA 59 (L) 05/06/2021 1844   GFRNONAA 44 (L) 04/25/2016 1244   GFRAA >60 09/14/2018 1657   GFRAA 51 (L) 04/25/2016 1244    BNP    Component Value Date/Time   BNP 21 06/01/2019 1624     Imaging:  No results found.        No data to display          No results found for: "NITRICOXIDE"      Assessment & Plan:   COPD (chronic obstructive pulmonary disease) ([place]) Compensated on current regimen and clinically stable.  There was some confusion surrounding his rescue inhaler.  Discussed that [place] both have the same class of medicines in them.  Bevespi is also not intended for rescue use.  We will provide him with a albuterol inhaler for this.  Instructed him on proper use.  He is going to continue on Stiolto as this is what he has been using daily and has been less expensive for him in the past.  Otherwise, they do not have any concerns or complaints today regarding his breathing.  Patient Instructions  Continue Stiolto 2 puffs daily  Stop using Bevespi; duplicate therapy as the Stiolto  Continue Albuterol inhaler 2 puffs or 3 mL neb every 6 hours as needed for shortness of breath or wheezing. Notify if symptoms persist despite rescue inhaler/neb use. Continue flonase 2 sprays each nostril daily Continue mucinex 600 mg Twice daily as needed for chest congestion  Continue supplemental oxygen 2 lpm at rest and at night and 4 lpm with activity. Goal oxygen >88-90%     Continue CPAP therapy nightly. Mask fitting at [facility]. Adjust CPAP settings to 8-12 cmH2O   Follow up with Dr. Lamonte Sakai or Joellen Jersey Bonnie Overdorf,NP in 4 weeks to see how adjustment on CPAP settings is going. If symptoms do not improve or worsen, please contact office for sooner follow up or seek emergency care.   Obstructive sleep apnea Severe OSA, on CPAP.  He is on a set pressure of 11 cmH2O.  At our previous visit, he was having mask leaks and breakthrough events.  He was advised to go for a mask fitting but never attended.  Today, upon download he is still having significant leaks and breakthrough events with residual AHI of 14.1.  Unsure if this is a setting or mask problem.  I am going to adjust his settings to AutoSet of 8-12; we will reevaluate at follow-up and make further adjustments as needed.  Educated him that he would still need to go for mask fitting.  Concerned that his mask is not fitting appropriately.  They verbalized understanding.  Referral placed today. We will also follow-up with his insurance company regarding coverage for his machine.   Chronic respiratory failure (HCC) Stable on 2-4 lpm. No increased requirement. Goal >88-90%    I spent 32 minutes of dedicated to the care of this patient on the date of this encounter to include pre-visit review of records, face-to-face time with the patient discussing conditions above, post visit ordering of testing, clinical documentation with the electronic health record, making appropriate referrals as documented, and communicating necessary findings to members of the patients care team.  Clayton Bibles, NP 11/29/2021  Pt aware and understands NP's role.

## 2021-11-29 NOTE — Assessment & Plan Note (Signed)
Compensated on current regimen and clinically stable.  There was some confusion surrounding his rescue inhaler.  Discussed that [place] both have the same class of medicines in them.  Bevespi is also not intended for rescue use.  We will provide him with a albuterol inhaler for this.  Instructed him on proper use.  He is going to continue on Stiolto as this is what he has been using daily and has been less expensive for him in the past.  Otherwise, they do not have any concerns or complaints today regarding his breathing.  Patient Instructions  Continue Stiolto 2 puffs daily  Stop using Bevespi; duplicate therapy as the Stiolto  Continue Albuterol inhaler 2 puffs or 3 mL neb every 6 hours as needed for shortness of breath or wheezing. Notify if symptoms persist despite rescue inhaler/neb use. Continue flonase 2 sprays each nostril daily Continue mucinex 600 mg Twice daily as needed for chest congestion  Continue supplemental oxygen 2 lpm at rest and at night and 4 lpm with activity. Goal oxygen >88-90%    Continue CPAP therapy nightly. Mask fitting at [facility]. Adjust CPAP settings to 8-12 cmH2O   Follow up with Dr. Lamonte Sakai or Lance Jersey Mumin Denomme,NP in 4 weeks to see how adjustment on CPAP settings is going. If symptoms do not improve or worsen, please contact office for sooner follow up or seek emergency care.

## 2021-11-29 NOTE — Patient Instructions (Signed)
Continue Stiolto 2 puffs daily  Stop using Bevespi; duplicate therapy as the Stiolto  Continue Albuterol inhaler 2 puffs or 3 mL neb every 6 hours as needed for shortness of breath or wheezing. Notify if symptoms persist despite rescue inhaler/neb use. Continue flonase 2 sprays each nostril daily Continue mucinex 600 mg Twice daily as needed for chest congestion  Continue supplemental oxygen 2 lpm at rest and at night and 4 lpm with activity. Goal oxygen >88-90%    Continue CPAP therapy nightly. Mask fitting at [facility]. Adjust CPAP settings to 8-12 cmH2O   Follow up with Dr. Lamonte Sakai or Joellen Jersey Jazper Nikolai,NP in 4 weeks to see how adjustment on CPAP settings is going. If symptoms do not improve or worsen, please contact office for sooner follow up or seek emergency care.

## 2021-11-29 NOTE — Assessment & Plan Note (Signed)
Severe OSA, on CPAP.  He is on a set pressure of 11 cmH2O.  At our previous visit, he was having mask leaks and breakthrough events.  He was advised to go for a mask fitting but never attended.  Today, upon download he is still having significant leaks and breakthrough events with residual AHI of 14.1.  Unsure if this is a setting or mask problem.  I am going to adjust his settings to AutoSet of 8-12; we will reevaluate at follow-up and make further adjustments as needed.  Educated him that he would still need to go for mask fitting.  Concerned that his mask is not fitting appropriately.  They verbalized understanding.  Referral placed today. We will also follow-up with his insurance company regarding coverage for his machine.

## 2021-11-30 ENCOUNTER — Ambulatory Visit (HOSPITAL_BASED_OUTPATIENT_CLINIC_OR_DEPARTMENT_OTHER)
Admission: RE | Admit: 2021-11-30 | Discharge: 2021-11-30 | Disposition: A | Discharge status: Home / Self Care | Payer: Medicare Other | Source: Ambulatory Visit | Attending: Medical | Admitting: Medical

## 2021-11-30 ENCOUNTER — Ambulatory Visit (INDEPENDENT_AMBULATORY_CARE_PROVIDER_SITE_OTHER): Payer: Medicare Other | Admitting: Medical

## 2021-11-30 VITALS — BP 127/52 | HR 108 | Temp 98.2°F | Resp 18 | Ht 72.0 in | Wt 234.0 lb

## 2021-11-30 DIAGNOSIS — E785 Hyperlipidemia, unspecified: Secondary | ICD-10-CM | POA: Diagnosis not present

## 2021-11-30 DIAGNOSIS — R0902 Hypoxemia: Secondary | ICD-10-CM

## 2021-11-30 DIAGNOSIS — J449 Chronic obstructive pulmonary disease, unspecified: Secondary | ICD-10-CM | POA: Diagnosis not present

## 2021-11-30 DIAGNOSIS — I10 Essential (primary) hypertension: Secondary | ICD-10-CM

## 2021-11-30 DIAGNOSIS — Z01818 Encounter for other preprocedural examination: Secondary | ICD-10-CM

## 2021-11-30 DIAGNOSIS — Z794 Long term (current) use of insulin: Secondary | ICD-10-CM

## 2021-11-30 DIAGNOSIS — J432 Centrilobular emphysema: Secondary | ICD-10-CM | POA: Insufficient documentation

## 2021-11-30 DIAGNOSIS — R918 Other nonspecific abnormal finding of lung field: Secondary | ICD-10-CM

## 2021-11-30 DIAGNOSIS — E118 Type 2 diabetes mellitus with unspecified complications: Secondary | ICD-10-CM | POA: Diagnosis not present

## 2021-11-30 DIAGNOSIS — J9690 Respiratory failure, unspecified, unspecified whether with hypoxia or hypercapnia: Secondary | ICD-10-CM | POA: Diagnosis not present

## 2021-11-30 DIAGNOSIS — J439 Emphysema, unspecified: Secondary | ICD-10-CM | POA: Diagnosis not present

## 2021-11-30 LAB — GLUCOSE, POCT (MANUAL RESULT ENTRY): POC Glucose: 128 mg/dl — AB (ref 70–99)

## 2021-11-30 NOTE — Telephone Encounter (Signed)
[facility] called back regarding clearance forms, she was informed patient has an appt scheduled today. She asked for the form to be faxed today them asap to fax: [phone].

## 2021-11-30 NOTE — Patient Instructions (Addendum)
Preop examination for eye surgery. Your 02 sat is lower than usual. Today 87-89% even with 3-4L. Lower than ideal/ expected. Will get cxr today and send message to pulmonologist to get advise before procedure.  Sugars today reasonable. Continue current diabetic meds.  Htn- bp well controlled.  Holding form until getting better idea/direction from pulmonlogistoffice. Hope to get answer by Monday morning.  Future cmp and A1c to done on week of Dec 17, 2021. Too early to get today.  Follow up date to be determined.  Cxr showed rt lower lobe opacity. Rx doxycycline. Placed referral to pulmonologist. Clearance for surgery now on hold.

## 2021-11-30 NOTE — Progress Notes (Unsigned)
Subjective:    Patient ID: Lance Powell, male    DOB: [birth year], 88 y.o.   MRN: [id number]  HPI  Pt in for preop evaluation. He is rt eye ectropion repair. Pt not aware of length of procedure or type of anesthesia.  On review pt had general anesthesia in past no complications.   IV sedation. No general anesthesia. One hour max.    Pt has copd. Last pulmonologist note below.  "Chronic respiratory failure ([place])  Chronic respiratory failure ([place]) Please continue your oxygen at all times 2-4 L/min depending on your level of exertion.  On last lab review. No anemia. Platelets   Diabetes- has not been checking sugar levels.last A1c 5.9. in office today 128. Last ate at lunch.    Review of Systems  Constitutional:  Negative for chills, fatigue and fever.  HENT:  Negative for congestion and drooling.   Respiratory:  Negative for cough, chest tightness, shortness of breath and wheezing.   Cardiovascular:  Negative for chest pain and palpitations.  Gastrointestinal:  Negative for abdominal pain, blood in stool, constipation, diarrhea, nausea and vomiting.  Genitourinary:  Negative for dysuria.  Musculoskeletal:  Negative for back pain and joint swelling.  Neurological:  Negative for dizziness, facial asymmetry and speech difficulty.  Hematological:  Negative for adenopathy.  Psychiatric/Behavioral:  Negative for agitation.    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   COPD (chronic obstructive pulmonary disease) (HCC)    Depression 03/08/2014   Diabetes mellitus    GERD (gastroesophageal reflux disease) 03/08/2014   History of blood transfusion    Hyperlipemia    Hypertension    OSA (obstructive sleep apnea)    Osteoarthritis    Rheumatoid arthritis(714.0)    Sleep apnea    cpap     Social History   Socioeconomic History   Marital status: Widowed    Spouse name: Not on file   Number of children: 6   Years of  education: Not on file   Highest education level: Not on file  Occupational History   Occupation: Retired    Comment: Chief Financial Officer   Occupation: retird  Tobacco Use   Smoking status: Former    Packs/day: 4.00    Years: 30.00    Total pack years: 120.00    Types: Cigarettes    Quit date: 01/21/1978    Years since quitting: 43.8   Smokeless tobacco: Never  Vaping Use   Vaping Use: Never used  Substance and Sexual Activity   Alcohol use: No   Drug use: No   Sexual activity: Not on file  Other Topics Concern   Not on file  Social History Narrative   Right Handed   Lives in a one story home   Drinks no caffeine    Social Determinants of Health   Financial Resource Strain: Medium Risk (12/31/2020)   Overall Financial Resource Strain (CARDIA)    Difficulty of Paying Living Expenses: Somewhat hard  Food Insecurity: No Food Insecurity (03/02/2021)   Hunger Vital Sign    Worried About Running Out of Food in the Last Year: Never true    [place] in the Last Year: Never true  Transportation Needs: No Transportation Needs (03/02/2021)   PRAPARE - Hydrologist (Medical): No    Lack of Transportation (Non-Medical): No  Physical Activity: Inactive (03/02/2021)   Exercise Vital Sign    Days of  Exercise per Week: 0 days    Minutes of Exercise per Session: 0 min  Stress: No Stress Concern Present (03/02/2021)   Luck    Feeling of Stress : Not at all  Social Connections: Socially Isolated (03/02/2021)   Social Connection and Isolation Panel [NHANES]    Frequency of Communication with Friends and Family: More than three times a week    Frequency of Social Gatherings with Friends and Family: More than three times a week    Attends Religious Services: Never    Marine scientist or Organizations: No    Attends Archivist Meetings: Never    Marital Status: Widowed  Intimate  Partner Violence: Not on file    Past Surgical History:  Procedure Laterality Date   APPENDECTOMY  1969   CHOLECYSTECTOMY N/A 06/16/2014   Procedure: LAPAROSCOPIC CHOLECYSTECTOMY;  Surgeon: Greer Pickerel, MD;  Location: [facility];  Service: General;  Laterality: N/A;   CYSTOSCOPY  01/23/2011   Procedure: CYSTOSCOPY;  Surgeon: Molli Hazard, MD;  Location: [facility];  Service: Urology;  Laterality: N/A;   FLEXIBLE SIGMOIDOSCOPY  01/07/2012   Procedure: FLEXIBLE SIGMOIDOSCOPY;  Surgeon: Ladene Artist, MD,FACG;  Location: [facility];  Service: Endoscopy;  Laterality: N/A;   HERNIA REPAIR  1990   right and left inguinal   NISSEN FUNDOPLICATION     SIGMOIDECTOMY  2004   colovesical fistula   TONSILLECTOMY  1937   TRANSURETHRAL RESECTION OF PROSTATE  01/23/2011   Procedure: TRANSURETHRAL RESECTION OF THE PROSTATE WITH GYRUS INSTRUMENTS;  Surgeon: Molli Hazard, MD;  Location: [facility];  Service: Urology;  Laterality: N/A;   VASECTOMY  1972    Family History  Problem Relation Age of Onset   Heart disease Mother    Diabetes Mother    Emphysema Brother    Breast cancer Daughter    Cancer Daughter        breast   Stomach cancer Neg Hx    Esophageal cancer Neg Hx    Colon cancer Neg Hx    Liver cancer Neg Hx    Pancreatic cancer Neg Hx    Rectal cancer Neg Hx     No Known Allergies  Current Outpatient Medications on File Prior to Visit  Medication Sig Dispense Refill   albuterol (VENTOLIN HFA) 108 (90 Base) MCG/ACT inhaler Inhale 2 puffs into the lungs every 6 (six) hours as needed for wheezing or shortness of breath. 8 g 2   aspirin 81 MG tablet Take 1 tablet (81 mg total) by mouth every morning. Stop for 1 week and then resume (Patient taking differently: Take 81 mg by mouth every morning.)     atorvastatin (LIPITOR) 40 MG tablet TAKE 1 TABLET BY MOUTH EVERY DAY 90 tablet 3   celecoxib (CELEBREX) 200 MG capsule TAKE 1 CAPSULE(200 MG)  BY MOUTH DAILY AS NEEDED FOR MODERATE PAIN 30 capsule 1   Cholecalciferol (VITAMIN D) 50 MCG (2000 UT) tablet Take 2,000 Units by mouth 2 (two) times daily.     colestipol (COLESTID) 1 g tablet Take 2 tablets (2 g total) by mouth 2 (two) times daily. 180 tablet 3   CONTOUR NEXT TEST test strip USE AS DIRECTED TO CHECK BLOOD SUGAR TWICE DAILY 100 strip 2   metFORMIN (GLUCOPHAGE) 500 MG tablet TAKE 1 TABLET(500 MG) BY MOUTH THREE TIMES DAILY 90 tablet 3   misoprostol (CYTOTEC) 100 MCG tablet TAKE  1 TABLET BY MOUTH TWICE DAILY AND 2 TABLETS AT BEDTIME (Patient taking differently: Take 100-200 mcg by mouth See admin instructions. Take 1 tablet by mouth twice a day and 2 tablets at bedtime) 360 tablet 3   Multiple Vitamins-Minerals (CENTRUM) tablet Take 1 tablet by mouth every morning. Reported on 01/05/2015     OVER THE COUNTER MEDICATION Place 1-2 drops into both eyes daily as needed (dry red eyes.). Reported on 01/05/2015     pioglitazone (ACTOS) 15 MG tablet TAKE 1 TABLET(15 MG) BY MOUTH DAILY (Patient taking differently: Take 15 mg by mouth daily at 12 noon.) 90 tablet 3   psyllium (METAMUCIL) 58.6 % packet Take 1 packet by mouth daily at 12 noon.     Spacer/Aero-Holding Chambers DEVI 1 Product by Does not apply route daily. 1 Product 0   Tiotropium Bromide-Olodaterol (STIOLTO RESPIMAT) 2.5-2.5 MCG/ACT AERS Inhale 2 puffs into the lungs daily. 4 g 5   UNABLE TO FIND CPAP     venlafaxine XR (EFFEXOR-XR) 37.5 MG 24 hr capsule TAKE 1 CAPSULE(37.5 MG) BY MOUTH DAILY WITH BREAKFAST 90 capsule 3   vitamin C (ASCORBIC ACID) 500 MG tablet Take 500 mg by mouth every morning.      Wheat Dextrin (BENEFIBER DRINK MIX PO) Take 1 Dose by mouth daily.     Zoster Vaccine Adjuvanted [facility]) injection Inject into the muscle. 1 each 1   No current facility-administered medications on file prior to visit.    BP (!) 127/52 (BP Location: Left Arm, Patient Position: Sitting, Cuff Size: Normal)   Pulse (!) 108    Temp 98.2 F (36.8 C) (Oral)   Resp 18   Ht 6' (1.829 m)   Wt 234 lb (106.1 kg)   SpO2 90%   BMI 31.74 kg/m        Objective:   Physical Exam        Assessment & Plan:   Patient Instructions  Preop examination for eye surgery. Your 02 sat is lower than usual. Today 87-89% even with 3-4L. Lower than ideal/ expected. Will get cxr today and send message to pulmonologist to get advise before procedure.  Sugars today reasonable. Continue current diabetic meds.  Htn- bp well controlled.  Holding form until getting better idea/direction from pulmonlogistoffice. Hope to get answer by Monday morning.  Future cmp and A1c to done on week of Dec 17, 2021. Too early to get today.  Follow up date to be determined.   [name], PA-C   Time spent with patient today was  43 minutes which consisted of chart revdiew, discussing diagnosis, work up ,treatment, referral to pulmonoloist  and documentation.

## 2021-12-01 MED ORDER — DOXYCYCLINE HYCLATE 100 MG PO TABS: 100.0000 mg | ORAL_TABLET | Freq: Two times a day (BID) | ORAL | 0 refills | Status: DC

## 2021-12-01 NOTE — Addendum Note (Signed)
Addended by: Anabel Halon on: 12/01/2021 08:42 AM   Modules accepted: Level of Service

## 2021-12-03 ENCOUNTER — Telehealth: Payer: Self-pay | Admitting: Emergency Medicine

## 2021-12-03 NOTE — Telephone Encounter (Signed)
Called Jenna at [facility] , made her aware pt wasn't cleared for surgery due to having pneumonia, she voiced understanding and stated surgery will postponed

## 2021-12-03 NOTE — Telephone Encounter (Signed)
Patient increased to 4 liters of oxygen. Patient oxygen levels are at 90% to 91%. [place] phone number is [phone].

## 2021-12-03 NOTE — Telephone Encounter (Signed)
Spoke with Lance Powell who states pt was diagnosed with pneumonia on 11/30/21 by PCP and started on Doxycyline. Pt is scheduled for OV with [facility] on 12/05/21 and Lance Powell was wondering what she should be looking for as far as worsening pneumonia prior to OV. RN instructed Lance Powell to monitor O2 saturation to and to maintain above 90% at all times even if O2 lpm needs to be increase and if not able to maintain saturations or pt develops more SOB pt would need to be seen in ED.  Lance Powell stated understanding.   Routing to [employer] as [employer]

## 2021-12-03 NOTE — Telephone Encounter (Signed)
Receieved referral from PCP advising for an urgent followup due to lower than normal 02 stats and pneumonia. Pt is scheduled for eye surgery tomorrow. See [name] OV notes from 11/10. [name] is asking for a callback regarding pt's pneumonia. Scheduled for OV with [place] on 11/15, but wants to know to what to look out for if things get worse. According to [facility] and note from PCP, pt is on a round of antibiotics. Please advise.

## 2021-12-03 NOTE — Telephone Encounter (Signed)
Is form ready?

## 2021-12-04 NOTE — Telephone Encounter (Signed)
Called and spoke w/ pt daughter Lance Powell, she states Mr.Lance Powell is using 4L continuous to keep his stats at 90%. Daughter reports that he is coughing up phlegm but otherwise he is feeling okay. She denies fevers, chest tightness/pain. Advised pt daughter to push fluids.

## 2021-12-04 NOTE — Telephone Encounter (Signed)
Can you please call and see what symptoms he is having? He was at his baseline when I saw him last week and O2 sats were in the 90's on his baseline supplemental O2. Also, is this on his POC or home oxygen concentrator?

## 2021-12-05 ENCOUNTER — Encounter: Payer: Self-pay | Admitting: Nurse Practitioner

## 2021-12-05 ENCOUNTER — Ambulatory Visit: Payer: Medicare Other | Admitting: Nurse Practitioner

## 2021-12-05 VITALS — BP 98/64 | HR 98 | Ht 72.0 in | Wt 234.6 lb

## 2021-12-05 DIAGNOSIS — J441 Chronic obstructive pulmonary disease with (acute) exacerbation: Secondary | ICD-10-CM | POA: Diagnosis not present

## 2021-12-05 DIAGNOSIS — J438 Other emphysema: Secondary | ICD-10-CM

## 2021-12-05 DIAGNOSIS — J9621 Acute and chronic respiratory failure with hypoxia: Secondary | ICD-10-CM | POA: Diagnosis not present

## 2021-12-05 LAB — CBC WITH DIFFERENTIAL/PLATELET
Basophils Absolute: 0.1 10*3/uL (ref 0.0–0.1)
Basophils Relative: 1.2 % (ref 0.0–3.0)
Eosinophils Absolute: 0.2 10*3/uL (ref 0.0–0.7)
Eosinophils Relative: 2.8 % (ref 0.0–5.0)
HCT: 44.4 % (ref 39.0–52.0)
Hemoglobin: 14.6 g/dL (ref 13.0–17.0)
Lymphocytes Relative: 22.8 % (ref 12.0–46.0)
Lymphs Abs: 2 10*3/uL (ref 0.7–4.0)
MCHC: 32.8 g/dL (ref 30.0–36.0)
MCV: 96.1 fl (ref 78.0–100.0)
Monocytes Absolute: 0.8 10*3/uL (ref 0.1–1.0)
Monocytes Relative: 9 % (ref 3.0–12.0)
Neutro Abs: 5.6 10*3/uL (ref 1.4–7.7)
Neutrophils Relative %: 64.2 % (ref 43.0–77.0)
Platelets: 259 10*3/uL (ref 150.0–400.0)
RBC: 4.62 Mil/uL (ref 4.22–5.81)
RDW: 14.4 % (ref 11.5–15.5)
WBC: 8.8 10*3/uL (ref 4.0–10.5)

## 2021-12-05 LAB — BASIC METABOLIC PANEL
BUN: 20 mg/dL (ref 6–23)
CO2: 32 mEq/L (ref 19–32)
Calcium: 10.3 mg/dL (ref 8.4–10.5)
Chloride: 103 mEq/L (ref 96–112)
Creatinine, Ser: 1.25 mg/dL (ref 0.40–1.50)
GFR: 50.81 mL/min — ABNORMAL LOW (ref >60.00)
Glucose, Bld: 111 mg/dL — ABNORMAL HIGH (ref 70–99)
Potassium: 4.5 mEq/L (ref 3.5–5.1)
Sodium: 140 mEq/L (ref 135–145)

## 2021-12-05 LAB — D-DIMER, QUANTITATIVE: D-Dimer, Quant: 0.74 mcg/mL FEU — ABNORMAL HIGH (ref <0.50)

## 2021-12-05 LAB — BRAIN NATRIURETIC PEPTIDE: Pro B Natriuretic peptide (BNP): 26 pg/mL (ref 0.0–100.0)

## 2021-12-05 MED ORDER — PREDNISONE 20 MG PO TABS: 40.0000 mg | ORAL_TABLET | Freq: Every day | ORAL | 0 refills | Status: AC

## 2021-12-05 NOTE — Progress Notes (Signed)
$'@Patient'c$  ID: Lance Powell, male    DOB: 1929-09-11, 88 y.o.   MRN: [id number]  Chief Complaint  Patient presents with   Follow-up    Pt acute visit, he says that he is feeling okay using 4L POC and continuous to get to 90%, denies fevers increased SOB. Daughter states sats dropped to 83% last night    Referring provider: Elise Benne  HPI: 88 year old male, former smoker (120 pack years) followed for COPD with emphysema, chronic respiratory failure and OSA.  He is a patient of Dr. Agustina Caroli and last seen in office on 11/29/2021.  Past medical history significant for dementia, hypertension, allergic rhinitis, diverticulitis, GERD, DM2, OA, HLD, RA, depression, ED.  TEST/EVENTS:  01/11/2011 PFT: FVC 83, FEV1 64, ratio 76, TLC 108, DLCOunc 46. Post bronchodilator not obtained 11/30/2021 CXR 2 view: hyperinflation. Trace biapical pleural/pulmonary scarring. Right airspace opacity, unclear etiology. Elevated left hemidiaphragm, with atelectasis. Blunting b/l of costophrenic angles; possible trace pleural effusions.   03/14/2021: OV with Dr. Lamonte Sakai.  Daughter accompanied patient.  Previously managed on Stiolto but reported that he is not taking it reliably.  Feels like he is still having some shortness of breath and persistent cough.  Discussed changing from Stiolto to G[place] and use with spacer.  Dependent on cost of medications.  Continue supplemental oxygen therapy at 2 L/min at rest and 4 L/min with exertion.  Continued CPAP at night.  Daughter voiced concerns about father safety at home.  Discussed that he forgets to use his oxygen and does not take his medications reliably.  He does have some impulsive angry behaviors.  Was referred to neuropsych.  Daughter called back and requested that this was canceled as her father and family did not wish to proceed any further with this.  04/04/2021: OV with Ariba Lehnen NP for follow-up.  He has been very consistent with Stiolto use and has been taking  it at 7 PM every night.  He has only been doing 1 puff daily but feels as though his breathing is better and his cough has significantly improved.  It was initially more productive with yellow sputum but this has cleared and he has a minimal, primarily nonproductive cough.  They report that the Bevespi was significantly more expensive than the Stiolto was.  He pays $100 a month for Stiolto which is affordable to them.  They do not wish to switch to the Bevespi at this time, especially since he feels like it is not working since he has increased his compliance.  He states that he has been wearing his oxygen as he is supposed to.  He also states that he wears his CPAP every night without difficulties.  He does continue to live relatively sedentary lifestyle and spends a lot of time resting in his chair. Continued on Stiolto. Exercise encouraged. Significant breakthrough events and leaks on CPAP - sent for mask fitting.  09/25/2021: OV with Dr. Lamonte Sakai. Stable on Stiolto. Recently treated for diverticulitis with abx 3-4 weeks prior. Daily chronic cough. Wears CPAP reliably; needs to get a new machine as his is broken and [employer] said it cannot be fixed.   11/29/2021: OV with Javares Kaufhold NP for follow-up with his daughter.  He has been doing well since he was here last.  He did have a little bit of confusion regarding his inhalers.  He is using Stiolto daily and then using Bevespi as rescue.  Not using it very often; maybe 1-2 times a week. The daughter is  not entirely sure if he has an albuterol inhaler at home.  Thinks there may be one there but would like a new prescription for this. Cough is at his baseline. No increased congestion. Continue on supplemental oxygen 2-4 lpm; 2 today on POC. He is wearing his CPAP nightly with full face mask. He did finally get a new machine; insurance is apparently giving them a difficult time about paying for it because he wasn't due for a new machine (not within the 5 year window). However,  his machine had broken and was not fixable, per his DME company. Still having significant leaks with breakthrough events. He never attended previously ordered mask fitting - reordered today and instructed on importance. Adjusted settings to auto 8-12 from set pressure of 11 cH2O. Will obtain CPAP titration if no improvement. Oxygen stable on baseline 2-4 lpm (2 lpm POC in office with sats 93%). Educated to not use both Lawyer - decision made to stay on [employer]; Bevespi d/c.   12/05/2021: Today - acute Patient presents today with his 2 daughters for acute visit.  After I saw him last, he went to his PCP on 11/10 for routine checkup/surgical clearance.  He was incidentally found to be hypoxic from 87% to 89%, on 4 L/min.  He is not having any acute symptoms.  Because of this, CXR was obtained which showed a right airspace opacity of unclear etiology.  He also had some trace pleural effusions with blunting of the bilateral costophrenic angles.  He was started on 10-day course of doxycycline, which he did have a little bit of a delay starting.  Currently has been on it for 48 hours; has not noticed any difference thus far in symptoms/O2 requirement.  They increased him to 4 L at home to maintain saturations greater than 88%.  Today, he tells me that he feels pretty close to his baseline aside from some slight increase shortness of breath with exertion, which started over the weekend.  He has no increased productive cough.  Has not had any fevers, chills, body aches, hemoptysis.  They have not noticed any swelling in his legs.  He denies any orthopnea or PND.  No calf pain or swelling.  No Known Allergies  Immunization History  Administered Date(s) Administered   Fluad Quad(high Dose 65+) 10/16/2018, 11/03/2019, 10/11/2020   Influenza Split 11/11/2010, 10/22/2011   Influenza Whole 01/22/2008, 11/22/2009   Influenza, High Dose Seasonal PF 09/27/2015, 11/04/2016, 10/22/2017   Influenza, Seasonal,  Injecte, Preservative Fre 10/09/2010   Influenza,inj,Quad PF,6+ Mos 10/19/2012, 11/03/2013, 11/10/2014   Influenza-Unspecified 11/21/2013   Moderna Sars-Covid-2 Vaccination 02/23/2019, 03/24/2019   Pfizer Covid-19 Vaccine Bivalent Booster 18yr & up 10/11/2020   Pneumococcal Conjugate-13 02/15/2014, 03/27/2015   Pneumococcal Polysaccharide-23 10/31/2006, 01/22/2007   Tdap 03/27/2015   Zoster Recombinat (Shingrix) 03/05/2021    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   COPD (chronic obstructive pulmonary disease) ([facility]    Depression 03/08/2014   Diabetes mellitus    GERD (gastroesophageal reflux disease) 03/08/2014   History of blood transfusion    Hyperlipemia    Hypertension    OSA (obstructive sleep apnea)    Osteoarthritis    Rheumatoid arthritis(714.0)    Sleep apnea    cpap    Tobacco History: Social History   Tobacco Use  Smoking Status Former   Packs/day: 4.00   Years: 30.00   Total pack years: 120.00   Types: Cigarettes  Quit date: 01/21/1978   Years since quitting: 43.9  Smokeless Tobacco Never   Counseling given: Not Answered   Outpatient Medications Prior to Visit  Medication Sig Dispense Refill   albuterol (VENTOLIN HFA) 108 (90 Base) MCG/ACT inhaler Inhale 2 puffs into the lungs every 6 (six) hours as needed for wheezing or shortness of breath. 8 g 2   aspirin 81 MG tablet Take 1 tablet (81 mg total) by mouth every morning. Stop for 1 week and then resume (Patient taking differently: Take 81 mg by mouth every morning.)     atorvastatin (LIPITOR) 40 MG tablet TAKE 1 TABLET BY MOUTH EVERY DAY 90 tablet 3   celecoxib (CELEBREX) 200 MG capsule TAKE 1 CAPSULE(200 MG) BY MOUTH DAILY AS NEEDED FOR MODERATE PAIN 30 capsule 1   Cholecalciferol (VITAMIN D) 50 MCG (2000 UT) tablet Take 2,000 Units by mouth 2 (two) times daily.     colestipol (COLESTID) 1 g tablet Take 2 tablets (2 g total) by mouth 2 (two) times daily. 180  tablet 3   CONTOUR NEXT TEST test strip USE AS DIRECTED TO CHECK BLOOD SUGAR TWICE DAILY 100 strip 2   doxycycline (VIBRA-TABS) 100 MG tablet Take 1 tablet (100 mg total) by mouth 2 (two) times daily. 20 tablet 0   metFORMIN (GLUCOPHAGE) 500 MG tablet TAKE 1 TABLET(500 MG) BY MOUTH THREE TIMES DAILY 90 tablet 3   misoprostol (CYTOTEC) 100 MCG tablet TAKE 1 TABLET BY MOUTH TWICE DAILY AND 2 TABLETS AT BEDTIME (Patient taking differently: Take 100-200 mcg by mouth See admin instructions. Take 1 tablet by mouth twice a day and 2 tablets at bedtime) 360 tablet 3   Multiple Vitamins-Minerals (CENTRUM) tablet Take 1 tablet by mouth every morning. Reported on 01/05/2015     OVER THE COUNTER MEDICATION Place 1-2 drops into both eyes daily as needed (dry red eyes.). Reported on 01/05/2015     pioglitazone (ACTOS) 15 MG tablet TAKE 1 TABLET(15 MG) BY MOUTH DAILY (Patient taking differently: Take 15 mg by mouth daily at 12 noon.) 90 tablet 3   psyllium (METAMUCIL) 58.6 % packet Take 1 packet by mouth daily at 12 noon.     Spacer/Aero-Holding Chambers DEVI 1 Product by Does not apply route daily. 1 Product 0   Tiotropium Bromide-Olodaterol (STIOLTO RESPIMAT) 2.5-2.5 MCG/ACT AERS Inhale 2 puffs into the lungs daily. 4 g 5   UNABLE TO FIND CPAP     venlafaxine XR (EFFEXOR-XR) 37.5 MG 24 hr capsule TAKE 1 CAPSULE(37.5 MG) BY MOUTH DAILY WITH BREAKFAST 90 capsule 3   vitamin C (ASCORBIC ACID) 500 MG tablet Take 500 mg by mouth every morning.      Wheat Dextrin (BENEFIBER DRINK MIX PO) Take 1 Dose by mouth daily.     Zoster Vaccine Adjuvanted [facility]) injection Inject into the muscle. 1 each 1   No facility-administered medications prior to visit.     Review of Systems:   Constitutional: No weight loss or gain, night sweats, fevers, chills, fatigue, or lassitude. HEENT: No headaches, difficulty swallowing, tooth/dental problems, or sore throat. No sneezing, itching, ear ache, nasal congestion, or post nasal  drip CV:  No chest pain, orthopnea, PND, swelling in lower extremities, anasarca, dizziness, palpitations, syncope Resp: +shortness of breath with exertion (slight increase); chronic cough, minimal sputum production (clear to white). No excess mucus or change in color of mucus.  No hemoptysis. No wheezing.  No chest wall deformity GI: No heartburn, indigestion, abd pain, N/V, diarrhea GU: No  dysuria, frequency, hematuria. No flank pain. Neuro: No dizziness or lightheadedness.  Psych: No depression or anxiety. Mood stable.     Physical Exam:  BP 98/64   Pulse 98   Ht 6' (1.829 m)   Wt 234 lb 9.6 oz (106.4 kg)   SpO2 90% Comment: 4L POC  BMI 31.82 kg/m   GEN: Pleasant, interactive, chronically-ill appearing; elderly; obese; in no acute distress. HEENT:  Normocephalic and atraumatic. PERRLA. Sclera white. Nasal turbinates pink, moist and patent bilaterally. No rhinorrhea present. Oropharynx pink and moist, without exudate or edema. No lesions, ulcerations, or postnasal drip.  NECK:  Supple w/ fair ROM. No JVD present. Normal carotid impulses w/o bruits. Thyroid symmetrical with no goiter or nodules palpated. No lymphadenopathy.   CV: RRR, no m/r/g, no peripheral edema. Pulses intact, +2 bilaterally. No cyanosis, pallor or clubbing. PULMONARY:  Unlabored, regular breathing. Diminished bilaterally A&P w/o wheezes/rales/rhonchi. No accessory muscle use. No dullness to percussion. GI: BS present and normoactive. Soft, non-tender to palpation. MSK: no deformities noted. Cap refill <2 seconds. Skin: warm. No lesions or rashes Neuro: A/Ox3. Short term memory impairment noted.     Lab Results:  CBC    Component Value Date/Time   WBC 9.0 05/06/2021 1844   RBC 5.28 05/06/2021 1844   HGB 16.7 05/06/2021 1844   HCT 52.1 (H) 05/06/2021 1844   PLT 226 05/06/2021 1844   MCV 98.7 05/06/2021 1844   MCH 31.6 05/06/2021 1844   MCHC 32.1 05/06/2021 1844   RDW 14.1 05/06/2021 1844   LYMPHSABS  2.1 05/06/2021 1844   MONOABS 0.7 05/06/2021 1844   EOSABS 0.2 05/06/2021 1844   BASOSABS 0.0 05/06/2021 1844    BMET    Component Value Date/Time   NA 144 09/14/2021 1414   K 4.4 09/14/2021 1414   CL 104 09/14/2021 1414   CO2 28 09/14/2021 1414   GLUCOSE 119 (H) 09/14/2021 1414   BUN 23 09/14/2021 1414   CREATININE 1.23 (H) 09/14/2021 1414   CALCIUM 10.1 09/14/2021 1414   GFRNONAA 59 (L) 05/06/2021 1844   GFRNONAA 44 (L) 04/25/2016 1244   GFRAA >60 09/14/2018 1657   GFRAA 51 (L) 04/25/2016 1244    BNP    Component Value Date/Time   BNP 21 06/01/2019 1624     Imaging:  DG Chest 2 View  Result Date: 11/30/2021 CLINICAL DATA:  copd, respiratory failure. 02 sat 87%. Basline close to 90%. upcoming surgery planned on tuesday EXAM: CHEST - 2 VIEW COMPARISON:  Chest x-ray 05/06/2021 FINDINGS: The heart and mediastinal contours are within normal limits. Atherosclerotic plaque of the aortic arch. Hyperinflation of the lungs. Trace biapical pleural/pulmonary scarring no pulmonary edema. Bibasilar scarring. Right airspace opacity of unclear etiology. Elevated left hemidiaphragm with likely atelectasis. Blunting of the bilateral costophrenic angles with trace pleural effusions not excluded. No pneumothorax. No acute osseous abnormality. IMPRESSION: 1. Right airspace opacity of unclear etiology. Followup PA and lateral chest X-ray is recommended in 3-4 weeks following therapy to ensure resolution and exclude underlying malignancy. 2. Elevated left hemidiaphragm with likely atelectasis. 3. Bibasilar scarring with blunting of the bilateral costophrenic angles with trace pleural effusions not excluded. 4. Aortic Atherosclerosis (ICD10-I70.0) and Emphysema (ICD10-J43.9). Electronically Signed   By: Iven Finn M.D.   On: 11/30/2021 17:54          No data to display          No results found for: "NITRICOXIDE"      Assessment & Plan:  Acute on chronic respiratory failure  ([place]) Onset 11/10 with desaturations to 87% despite supplemental O2. No acute symptoms at the time. CXR obtained by his PCP showed a right lung airspace opacity, unclear etiology. He also had some blunting of bilateral costophrenic angles, possible trace pleural effusions. He was started on doxycycline 10 day course, which he had been on for 48 hours. No significant change. Possible this is related to CAP but with his lack of infectious symptoms, warrants further workup. He is higher risk for development of DVT/PE - d dimer ordered today. If positive, we will obtain CTA chest. He did have some concern for volume overload with questionable b/l pleural effusions; does have poorly controlled OSA and COPD so possible pulmonary HTN or decompensated CHF? He does appear euvolemic on exam but we will check BNP and BMET. May consider short course of diuretic therapy. CBC with diff to evaluate for leukocytosis and/or anemia.  Able to maintain on 3 lpm continuous O2 today with activity. Strict return/ED precautions. Goal >88-90%  Patient Instructions  Continue Stiolto 2 puffs daily  Continue Albuterol inhaler 2 puffs or 3 mL neb every 6 hours as needed for shortness of breath or wheezing. Notify if symptoms persist despite rescue inhaler/neb use. Use nebulizer at least twice daily until symptoms improve Continue flonase 2 sprays each nostril daily Continue mucinex 600 mg Twice daily as needed for chest congestion  Continue supplemental oxygen 3 lpm with rest and activity. Goal oxygen >88-90%    Continue CPAP therapy nightly. Attend mask fitting at [facility] once symptoms improve, as previously ordered Adjust CPAP settings to 8-12 cmH2O  Complete doxycycline as previously prescribed Prednisone 40 mg daily for 5 days. Take in AM with food. Monitor your blood sugars; if >350-500 despite your medications, go to the emergency department  Labs today - d dimer, BNP, BMET, CBC with diff We may obtain a CT of your  chest depending on your labs We also discussed the possibility of treating you with a fluid pill, should your oxygen problems be related to volume overload. We will await your labs and I will call you to discuss next steps    If your breathing worsens or you are unable to maintain your oxygen levels >88-90%, go to the emergency department.   Follow up with Dr. Lamonte Sakai or Joellen Jersey Azaryah Heathcock,NP in 1 week. If symptoms do not improve or worsen, please contact office for sooner follow up or seek emergency care.   COPD (chronic obstructive pulmonary disease) (HCC) Moderate COPD; maintained on Stiolto. Increased dyspnea since 11/11 and acute on chronic respiratory failure noted 11/10. See above plan. Poor aeration on exam today - we will treat him for potential AECOPD with prednisone burst. Advised him to use his neb treatments at least twice daily until symptoms improve. Continue LAMA/LABA therapy.      I spent 45 minutes of dedicated to the care of this patient on the date of this encounter to include pre-visit review of records, face-to-face time with the patient discussing conditions above, post visit ordering of testing, clinical documentation with the electronic health record, making appropriate referrals as documented, and communicating necessary findings to members of the patients care team.  Clayton Bibles, NP 12/05/2021  Pt aware and understands NP's role.

## 2021-12-05 NOTE — Assessment & Plan Note (Signed)
Moderate COPD; maintained on Stiolto. Increased dyspnea since 11/11 and acute on chronic respiratory failure noted 11/10. See above plan. Poor aeration on exam today - we will treat him for potential AECOPD with prednisone burst. Advised him to use his neb treatments at least twice daily until symptoms improve. Continue LAMA/LABA therapy.

## 2021-12-05 NOTE — Assessment & Plan Note (Addendum)
Onset 11/10 with desaturations to 87% despite supplemental O2. No acute symptoms at the time. CXR obtained by his PCP showed a right lung airspace opacity, unclear etiology. He also had some blunting of bilateral costophrenic angles, possible trace pleural effusions. He was started on doxycycline 10 day course, which he had been on for 48 hours. No significant change. Possible this is related to CAP but with his lack of infectious symptoms, warrants further workup. He is higher risk for development of DVT/PE - d dimer ordered today. If positive, we will obtain CTA chest. He did have some concern for volume overload with questionable b/l pleural effusions; does have poorly controlled OSA and COPD so possible pulmonary HTN or decompensated CHF? He does appear euvolemic on exam but we will check BNP and BMET. May consider short course of diuretic therapy. CBC with diff to evaluate for leukocytosis and/or anemia.  Able to maintain on 3 lpm continuous O2 today with activity. Strict return/ED precautions. Goal >88-90%  Patient Instructions  Continue Stiolto 2 puffs daily  Continue Albuterol inhaler 2 puffs or 3 mL neb every 6 hours as needed for shortness of breath or wheezing. Notify if symptoms persist despite rescue inhaler/neb use. Use nebulizer at least twice daily until symptoms improve Continue flonase 2 sprays each nostril daily Continue mucinex 600 mg Twice daily as needed for chest congestion  Continue supplemental oxygen 3 lpm with rest and activity. Goal oxygen >88-90%    Continue CPAP therapy nightly. Attend mask fitting at [facility] once symptoms improve, as previously ordered Adjust CPAP settings to 8-12 cmH2O  Complete doxycycline as previously prescribed Prednisone 40 mg daily for 5 days. Take in AM with food. Monitor your blood sugars; if >350-500 despite your medications, go to the emergency department  Labs today - d dimer, BNP, BMET, CBC with diff We may obtain a CT of your chest  depending on your labs We also discussed the possibility of treating you with a fluid pill, should your oxygen problems be related to volume overload. We will await your labs and I will call you to discuss next steps    If your breathing worsens or you are unable to maintain your oxygen levels >88-90%, go to the emergency department.   Follow up with Dr. Lamonte Sakai or Joellen Jersey Isma Tietje,NP in 1 week. If symptoms do not improve or worsen, please contact office for sooner follow up or seek emergency care.

## 2021-12-05 NOTE — Patient Instructions (Addendum)
Continue Stiolto 2 puffs daily  Continue Albuterol inhaler 2 puffs or 3 mL neb every 6 hours as needed for shortness of breath or wheezing. Notify if symptoms persist despite rescue inhaler/neb use. Use nebulizer at least twice daily until symptoms improve Continue flonase 2 sprays each nostril daily Continue mucinex 600 mg Twice daily as needed for chest congestion  Continue supplemental oxygen 3 lpm with rest and activity. Goal oxygen >88-90%    Continue CPAP therapy nightly. Attend mask fitting at [facility] once symptoms improve, as previously ordered Adjust CPAP settings to 8-12 cmH2O  Complete doxycycline as previously prescribed Prednisone 40 mg daily for 5 days. Take in AM with food. Monitor your blood sugars; if >350-500 despite your medications, go to the emergency department  Labs today - d dimer, BNP, BMET, CBC with diff We may obtain a CT of your chest depending on your labs We also discussed the possibility of treating you with a fluid pill, should your oxygen problems be related to volume overload. We will await your labs and I will call you to discuss next steps    If your breathing worsens or you are unable to maintain your oxygen levels >88-90%, go to the emergency department.   Follow up with Dr. Lamonte Sakai or Lance Jersey Virginia Curl,Lance Powell in 1 week. If symptoms do not improve or worsen, please contact office for sooner follow up or seek emergency care.

## 2021-12-06 ENCOUNTER — Ambulatory Visit (HOSPITAL_BASED_OUTPATIENT_CLINIC_OR_DEPARTMENT_OTHER)
Admission: RE | Admit: 2021-12-06 | Discharge: 2021-12-06 | Disposition: A | Discharge status: Home / Self Care | Payer: Medicare Other | Source: Ambulatory Visit | Attending: Nurse Practitioner | Admitting: Nurse Practitioner

## 2021-12-06 ENCOUNTER — Other Ambulatory Visit: Payer: Self-pay

## 2021-12-06 DIAGNOSIS — R0602 Shortness of breath: Secondary | ICD-10-CM | POA: Insufficient documentation

## 2021-12-06 DIAGNOSIS — R7989 Other specified abnormal findings of blood chemistry: Secondary | ICD-10-CM | POA: Diagnosis not present

## 2021-12-06 MED ORDER — IOHEXOL 350 MG/ML SOLN
75.0000 mL | Freq: Once | INTRAVENOUS | Status: AC | PRN
  Administered 2021-12-06: 75 mL via INTRAVENOUS

## 2021-12-06 NOTE — Progress Notes (Signed)
Please notify patient that his d dimer was positive, which can mean there is a blood clot present. We discussed this yesterday. He will need a STAT CTA chest completed today given this, his SOB, and hypoxia. Please notify PCC's as well. Thanks.

## 2021-12-06 NOTE — Progress Notes (Signed)
Please notify patient that his CTA did not show any evidence of PE or pneumonia, which is good news. There were also no findings of fluid overload/fluid on the lungs. AECOPD is likely the cause of his low O2 levels. Complete prednisone as prescribed yesterday. Ok to complete doxy prescribed by PCP. Use neb treatments twice a day until seen back. Keep using maintenance medications as well. Thanks.

## 2021-12-07 ENCOUNTER — Telehealth: Payer: Self-pay | Admitting: Medical

## 2021-12-07 MED ORDER — BLOOD GLUCOSE METER KIT: PACK | 0 refills | Status: DC

## 2021-12-07 NOTE — Telephone Encounter (Signed)
Rx sent 

## 2021-12-07 NOTE — Telephone Encounter (Signed)
[name] (daughter) called stating that pt needed an Rx for the following items sent to the following pharmacy:  AccuCheck Guide Glucose Meter  Test Strips for Glucose Meter  Lancets for Glucose Meter   [id number] - [place], [place] - [address], [place]  [address] Phone: [phone]  Fax: [phone]

## 2021-12-12 ENCOUNTER — Encounter: Payer: Self-pay | Admitting: Nurse Practitioner

## 2021-12-12 ENCOUNTER — Ambulatory Visit: Payer: Medicare Other | Admitting: Nurse Practitioner

## 2021-12-12 VITALS — BP 110/70 | HR 77 | Ht 72.0 in | Wt 231.6 lb

## 2021-12-12 DIAGNOSIS — J9611 Chronic respiratory failure with hypoxia: Secondary | ICD-10-CM

## 2021-12-12 DIAGNOSIS — G4733 Obstructive sleep apnea (adult) (pediatric): Secondary | ICD-10-CM | POA: Diagnosis not present

## 2021-12-12 DIAGNOSIS — J438 Other emphysema: Secondary | ICD-10-CM

## 2021-12-12 NOTE — Assessment & Plan Note (Addendum)
Resolving AECOPD. Clinically improved. Continue LABA/LAMA therapy. Close follow up should symptoms return/worsen.   Patient Instructions  Continue Stiolto 2 puffs daily  Continue Albuterol inhaler 2 puffs or 3 mL neb every 6 hours as needed for shortness of breath or wheezing. Notify if symptoms persist despite rescue inhaler/neb use.  Continue flonase 2 sprays each nostril daily Continue mucinex 600 mg Twice daily as needed for chest congestion  Continue supplemental oxygen 3 lpm with rest and activity. Goal oxygen >88-90%    Continue CPAP therapy nightly. Attend mask fitting at [facility] once symptoms improve, as previously ordered   Follow up with Dr. Lamonte Sakai in 3 months. If symptoms do not improve or worsen, please contact office for sooner follow up or seek emergency care.

## 2021-12-12 NOTE — Assessment & Plan Note (Signed)
Improved. He was able to maintain oxygen saturations >93% on 3 lpm POC during walking oximetry today, which is his baseline. Continue to monitor for goal >88-90%

## 2021-12-12 NOTE — Assessment & Plan Note (Signed)
Compliant with CPAP. We adjusted his settings at our visit on 11/9. He was also ordered mask fitting due to large leaks. Currently waiting on this. We will recheck download in 6 weeks to ensure his breakthrough events have lessened.

## 2021-12-12 NOTE — Patient Instructions (Addendum)
Continue Stiolto 2 puffs daily  Continue Albuterol inhaler 2 puffs or 3 mL neb every 6 hours as needed for shortness of breath or wheezing. Notify if symptoms persist despite rescue inhaler/neb use.  Continue flonase 2 sprays each nostril daily Continue mucinex 600 mg Twice daily as needed for chest congestion  Continue supplemental oxygen 3 lpm with rest and activity. Goal oxygen >88-90%    Continue CPAP therapy nightly. Attend mask fitting at [facility] once symptoms improve, as previously ordered   Follow up with Dr. Lamonte Sakai in 3 months. If symptoms do not improve or worsen, please contact office for sooner follow up or seek emergency care.

## 2021-12-12 NOTE — Progress Notes (Signed)
_0  ID: Kathlen Mody, male    DOB: Jan 27, 1929, 88 y.o.   MRN: [id number]  Chief Complaint  Patient presents with   Follow-up    SOB has improved     Referring provider: Elise Benne  HPI: 88 year old male, former smoker (120 pack years) followed for COPD with emphysema, chronic respiratory failure and OSA.  He is a patient of Dr. Agustina Caroli and last seen in office on 12/04/2021 by [facility] NP.  Past medical history significant for dementia, hypertension, allergic rhinitis, diverticulitis, GERD, DM2, OA, HLD, RA, depression, ED.  TEST/EVENTS:  01/11/2011 PFT: FVC 83, FEV1 64, ratio 76, TLC 108, DLCOunc 46. Post bronchodilator not obtained 11/30/2021 CXR 2 view: hyperinflation. Trace biapical pleural/pulmonary scarring. Right airspace opacity, unclear etiology. Elevated left hemidiaphragm, with atelectasis. Blunting b/l of costophrenic angles; possible trace pleural effusions. 12/06/2021 CTA chest: Atherosclerosis.  No evidence of PE.  Subcentimeter nodes in the mediastinum with no significant interval change.  There is no focal pulmonary consolidation.  There is increase in interstitial markings in the periphery of both lungs with no significant interval change.  There are few small nodular densities, each measuring less than 5 mm in size with no significant interval change.  Possibly granulomas  03/14/2021: OV with Dr. Lamonte Sakai.  Daughter accompanied patient.  Previously managed on Stiolto but reported that he is not taking it reliably.  Feels like he is still having some shortness of breath and persistent cough.  Discussed changing from Stiolto to [place] and use with spacer.  Dependent on cost of medications.  Continue supplemental oxygen therapy at 2 L/min at rest and 4 L/min with exertion.  Continued CPAP at night.  Daughter voiced concerns about father safety at home.  Discussed that he forgets to use his oxygen and does not take his medications reliably.  He does have some  impulsive angry behaviors.  Was referred to neuropsych.  Daughter called back and requested that this was canceled as her father and family did not wish to proceed any further with this.  04/04/2021: OV with Jazae Gandolfi NP for follow-up.  He has been very consistent with Stiolto use and has been taking it at 7 PM every night.  He has only been doing 1 puff daily but feels as though his breathing is better and his cough has significantly improved.  It was initially more productive with yellow sputum but this has cleared and he has a minimal, primarily nonproductive cough.  They report that the Bevespi was significantly more expensive than the Stiolto was.  He pays $100 a month for Stiolto which is affordable to them.  They do not wish to switch to the Bevespi at this time, especially since he feels like it is not working since he has increased his compliance.  He states that he has been wearing his oxygen as he is supposed to.  He also states that he wears his CPAP every night without difficulties.  He does continue to live relatively sedentary lifestyle and spends a lot of time resting in his chair. Continued on Stiolto. Exercise encouraged. Significant breakthrough events and leaks on CPAP - sent for mask fitting.  09/25/2021: OV with Dr. Lamonte Sakai. Stable on Stiolto. Recently treated for diverticulitis with abx 3-4 weeks prior. Daily chronic cough. Wears CPAP reliably; needs to get a new machine as his is broken and [employer] said it cannot be fixed.   11/29/2021: OV with Charm Stenner NP for follow-up with his daughter.  He has been doing well  since he was here last.  He did have a little bit of confusion regarding his inhalers.  He is using Stiolto daily and then using Bevespi as rescue.  Not using it very often; maybe 1-2 times a week. The daughter is not entirely sure if he has an albuterol inhaler at home.  Thinks there may be one there but would like a new prescription for this. Cough is at his baseline. No increased congestion.  Continue on supplemental oxygen 2-4 lpm; 2 today on POC. He is wearing his CPAP nightly with full face mask. He did finally get a new machine; insurance is apparently giving them a difficult time about paying for it because he wasn't due for a new machine (not within the 5 year window). However, his machine had broken and was not fixable, per his DME company. Still having significant leaks with breakthrough events. He never attended previously ordered mask fitting - reordered today and instructed on importance. Adjusted settings to auto 8-12 from set pressure of 11 cH2O. Will obtain CPAP titration if no improvement. Oxygen stable on baseline 2-4 lpm (2 lpm POC in office with sats 93%). Educated to not use both Lawyer - decision made to stay on [employer]; Bevespi d/c.   12/05/2021: OV with Aleighya Mcanelly NP for acute visit.  After I saw him last, he went to his PCP on 11/10 for routine checkup/surgical clearance.  He was incidentally found to be hypoxic from 87% to 89%, on 4 L/min.  He is not having any acute symptoms.  Because of this, CXR was obtained which showed a right airspace opacity of unclear etiology.  He also had some trace pleural effusions with blunting of the bilateral costophrenic angles.  He was started on 10-day course of doxycycline, which he did have a little bit of a delay starting.  Currently has been on it for 48 hours; has not noticed any difference thus far in symptoms/O2 requirement.  They increased him to 4 L at home to maintain saturations greater than 88%.  Today, he tells me that he feels pretty close to his baseline aside from some slight increase shortness of breath with exertion, which started over the weekend.  He has no increased productive cough.  Has not had any fevers, chills, body aches, hemoptysis.  They have not noticed any swelling in his legs.  He denies any orthopnea or PND.  No calf pain or swelling.  Possible this is related to CAP but with his lack of infectious  symptoms, warrants further workup. He is higher risk for development of DVT/PE - d dimer ordered today. If positive, we will obtain CTA chest. He did have some concern for volume overload with questionable b/l pleural effusions; does have poorly controlled OSA and COPD so possible pulmonary HTN or decompensated CHF? He does appear euvolemic on exam but we will check BNP and BMET. May consider short course of diuretic therapy. CBC with diff to evaluate for leukocytosis and/or anemia.  Able to maintain on 3 lpm continuous O2 today with activity. Strict return/ED precautions. Goal >88-90%   12/12/2021: Today - follow up Patient presents today with his daughter for follow up. He was worked up for acute on chronic respiratory failure last visit, initially thought to be related to CAP. D dimer was obtained which was positive so he underwent stat CTA. There was no evidence of acute pulmonary process, including PE or pna. No evidence of volume overload. He was treated for AECOPD with prednisone burst  and instructed to complete doxycycline course. Today, he is clinically improved. Feels like he's back to his baseline. They are working to wean his oxygen back; currently have him at 3.5 lpm. No increased cough or chest congestion. No fevers, chills, hemoptysis, leg swelling, orthopnea. Doing much better. Using stiolto daily.  No Known Allergies  Immunization History  Administered Date(s) Administered   Fluad Quad(high Dose 65+) 10/16/2018, 11/03/2019, 10/11/2020   Influenza Split 11/11/2010, 10/22/2011   Influenza Whole 01/22/2008, 11/22/2009   Influenza, High Dose Seasonal PF 09/27/2015, 11/04/2016, 10/22/2017   Influenza, Seasonal, Injecte, Preservative Fre 10/09/2010   Influenza,inj,Quad PF,6+ Mos 10/19/2012, 11/03/2013, 11/10/2014   Influenza-Unspecified 11/21/2013   Moderna Sars-Covid-2 Vaccination 02/23/2019, 03/24/2019   Pfizer Covid-19 Vaccine Bivalent Booster 54yr & up 10/11/2020   Pneumococcal  Conjugate-13 02/15/2014, 03/27/2015   Pneumococcal Polysaccharide-23 10/31/2006, 01/22/2007   Tdap 03/27/2015   Zoster Recombinat (Shingrix) 03/05/2021    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   COPD (chronic obstructive pulmonary disease) [place]    Depression 03/08/2014   Diabetes mellitus    GERD (gastroesophageal reflux disease) 03/08/2014   History of blood transfusion    Hyperlipemia    Hypertension    OSA (obstructive sleep apnea)    Osteoarthritis    Rheumatoid arthritis(714.0)    Sleep apnea    cpap    Tobacco History: Social History   Tobacco Use  Smoking Status Former   Packs/day: 4.00   Years: 30.00   Total pack years: 120.00   Types: Cigarettes   Quit date: 01/21/1978   Years since quitting: 43.9  Smokeless Tobacco Never   Counseling given: Not Answered   Outpatient Medications Prior to Visit  Medication Sig Dispense Refill   albuterol (VENTOLIN HFA) 108 (90 Base) MCG/ACT inhaler Inhale 2 puffs into the lungs every 6 (six) hours as needed for wheezing or shortness of breath. 8 g 2   aspirin 81 MG tablet Take 1 tablet (81 mg total) by mouth every morning. Stop for 1 week and then resume (Patient taking differently: Take 81 mg by mouth every morning.)     atorvastatin (LIPITOR) 40 MG tablet TAKE 1 TABLET BY MOUTH EVERY DAY 90 tablet 3   blood glucose meter kit and supplies Dispense based on patient and insurance preference. Use up to two times daily as directed. (FOR ICD-10 E10.9, E11.9). 1 each 0   celecoxib (CELEBREX) 200 MG capsule TAKE 1 CAPSULE(200 MG) BY MOUTH DAILY AS NEEDED FOR MODERATE PAIN 30 capsule 1   Cholecalciferol (VITAMIN D) 50 MCG (2000 UT) tablet Take 2,000 Units by mouth 2 (two) times daily.     colestipol (COLESTID) 1 g tablet Take 2 tablets (2 g total) by mouth 2 (two) times daily. 180 tablet 3   CONTOUR NEXT TEST test strip USE AS DIRECTED TO CHECK BLOOD SUGAR TWICE DAILY 100 strip 2    doxycycline (VIBRA-TABS) 100 MG tablet Take 1 tablet (100 mg total) by mouth 2 (two) times daily. 20 tablet 0   metFORMIN (GLUCOPHAGE) 500 MG tablet TAKE 1 TABLET(500 MG) BY MOUTH THREE TIMES DAILY 90 tablet 3   misoprostol (CYTOTEC) 100 MCG tablet TAKE 1 TABLET BY MOUTH TWICE DAILY AND 2 TABLETS AT BEDTIME (Patient taking differently: Take 100-200 mcg by mouth See admin instructions. Take 1 tablet by mouth twice a day and 2 tablets at bedtime) 360 tablet 3   Multiple Vitamins-Minerals (CENTRUM) tablet Take 1 tablet by mouth every  morning. Reported on 01/05/2015     OVER THE COUNTER MEDICATION Place 1-2 drops into both eyes daily as needed (dry red eyes.). Reported on 01/05/2015     pioglitazone (ACTOS) 15 MG tablet TAKE 1 TABLET(15 MG) BY MOUTH DAILY (Patient taking differently: Take 15 mg by mouth daily at 12 noon.) 90 tablet 3   psyllium (METAMUCIL) 58.6 % packet Take 1 packet by mouth daily at 12 noon.     Spacer/Aero-Holding Chambers DEVI 1 Product by Does not apply route daily. 1 Product 0   Tiotropium Bromide-Olodaterol (STIOLTO RESPIMAT) 2.5-2.5 MCG/ACT AERS Inhale 2 puffs into the lungs daily. 4 g 5   UNABLE TO FIND CPAP     venlafaxine XR (EFFEXOR-XR) 37.5 MG 24 hr capsule TAKE 1 CAPSULE(37.5 MG) BY MOUTH DAILY WITH BREAKFAST 90 capsule 3   vitamin C (ASCORBIC ACID) 500 MG tablet Take 500 mg by mouth every morning.      Wheat Dextrin (BENEFIBER DRINK MIX PO) Take 1 Dose by mouth daily.     Zoster Vaccine Adjuvanted [facility]) injection Inject into the muscle. 1 each 1   No facility-administered medications prior to visit.     Review of Systems:   Constitutional: No weight loss or gain, night sweats, fevers, chills, fatigue, or lassitude. HEENT: No headaches, difficulty swallowing, tooth/dental problems, or sore throat. No sneezing, itching, ear ache, nasal congestion, or post nasal drip CV:  No chest pain, orthopnea, PND, swelling in lower extremities, anasarca, dizziness,  palpitations, syncope Resp: +shortness of breath with exertion (baseline); chronic cough, minimal sputum production (clear to white). No excess mucus or change in color of mucus.  No hemoptysis. No wheezing.  No chest wall deformity GI: No heartburn, indigestion, abd pain, N/V, diarrhea GU: No dysuria, frequency, hematuria. No flank pain. Neuro: No dizziness or lightheadedness.  Psych: No depression or anxiety. Mood stable.     Physical Exam:  BP 110/70 (BP Location: Right Arm, Patient Position: Sitting, Cuff Size: Normal)   Pulse 77   Ht 6' (1.829 m)   Wt 231 lb 9.6 oz (105.1 kg)   SpO2 94%   BMI 31.41 kg/m   GEN: Pleasant, interactive, chronically-ill appearing; elderly; obese; in no acute distress. HEENT:  Normocephalic and atraumatic. PERRLA. Sclera white. Nasal turbinates pink, moist and patent bilaterally. No rhinorrhea present. Oropharynx pink and moist, without exudate or edema. No lesions, ulcerations, or postnasal drip.  NECK:  Supple w/ fair ROM. No JVD present. Normal carotid impulses w/o bruits. Thyroid symmetrical with no goiter or nodules palpated. No lymphadenopathy.   CV: RRR, no m/r/g, no peripheral edema. Pulses intact, +2 bilaterally. No cyanosis, pallor or clubbing. PULMONARY:  Unlabored, regular breathing. Diminished bilaterally A&P w/o wheezes/rales/rhonchi. No accessory muscle use. No dullness to percussion. GI: BS present and normoactive. Soft, non-tender to palpation. MSK: no deformities noted. Cap refill <2 seconds. Skin: warm. No lesions or rashes Neuro: A/Ox3. Short term memory impairment noted.     Lab Results:  CBC    Component Value Date/Time   WBC 8.8 12/05/2021 1410   RBC 4.62 12/05/2021 1410   HGB 14.6 12/05/2021 1410   HCT 44.4 12/05/2021 1410   PLT 259.0 12/05/2021 1410   MCV 96.1 12/05/2021 1410   MCH 31.6 05/06/2021 1844   MCHC 32.8 12/05/2021 1410   RDW 14.4 12/05/2021 1410   LYMPHSABS 2.0 12/05/2021 1410   MONOABS 0.8 12/05/2021  1410   EOSABS 0.2 12/05/2021 1410   BASOSABS 0.1 12/05/2021 1410    BMET  Component Value Date/Time   NA 140 12/05/2021 1410   K 4.5 12/05/2021 1410   CL 103 12/05/2021 1410   CO2 32 12/05/2021 1410   GLUCOSE 111 (H) 12/05/2021 1410   BUN 20 12/05/2021 1410   CREATININE 1.25 12/05/2021 1410   CREATININE 1.23 (H) 09/14/2021 1414   CALCIUM 10.3 12/05/2021 1410   GFRNONAA 59 (L) 05/06/2021 1844   GFRNONAA 44 (L) 04/25/2016 1244   GFRAA >60 09/14/2018 1657   GFRAA 51 (L) 04/25/2016 1244    BNP    Component Value Date/Time   BNP 21 06/01/2019 1624     Imaging:  CT Angio Chest W/Cm &/Or Wo Cm  Result Date: 12/06/2021 CLINICAL DATA:  Positive D-dimer, shortness of breath, back pain EXAM: CT ANGIOGRAPHY CHEST WITH CONTRAST TECHNIQUE: Multidetector CT imaging of the chest was performed using the standard protocol during bolus administration of intravenous contrast. Multiplanar CT image reconstructions and MIPs were obtained to evaluate the vascular anatomy. RADIATION DOSE REDUCTION: This exam was performed according to the departmental dose-optimization program which includes automated exposure control, adjustment of the mA and/or kV according to patient size and/or use of iterative reconstruction technique. CONTRAST:  21m OMNIPAQUE IOHEXOL 350 MG/ML SOLN COMPARISON:  Chest radiographs done on 11/30/2021, CT done on 05/10/2019 FINDINGS: Cardiovascular: Coronary artery calcifications are seen. There are scattered calcifications in thoracic aorta. There is homogeneous enhancement in thoracic aorta. There are no intraluminal filling defects seen pulmonary artery branches. Evaluation of small peripheral subsegmental branches is limited by motion artifacts. Mediastinum/Nodes: There are subcentimeter nodes in mediastinum with no significant interval change. Lungs/Pleura: There is no focal pulmonary consolidation. There is increase in interstitial markings in the periphery of both lungs with no  significant interval change. There are few small nodular densities each measuring less than 5 mm in size in both lungs with no significant interval change. There is no pleural effusion or pneumothorax. Left hemidiaphragm is elevated. Upper Abdomen: Surgical clips are seen in gallbladder fossa. Possible vascular coils are seen in epigastrium. Scattered diverticula seen in colon. Calcifications are seen in abdominal aorta and its major branches. Musculoskeletal: No acute findings are seen. Review of the MIP images confirms the above findings. IMPRESSION: There is no evidence of pulmonary artery embolism. There is no evidence of thoracic aortic dissection. Coronary artery calcifications are seen. COPD. There is no focal pulmonary consolidation. Few small nodular densities appears stable, possibly granulomas. Diverticulosis of colon. Other findings as described in the body of the report. Electronically Signed   By: PElmer PickerM.D.   On: 12/06/2021 12:47   DG Chest 2 View  Result Date: 11/30/2021 CLINICAL DATA:  copd, respiratory failure. 02 sat 87%. Basline close to 90%. upcoming surgery planned on tuesday EXAM: CHEST - 2 VIEW COMPARISON:  Chest x-ray 05/06/2021 FINDINGS: The heart and mediastinal contours are within normal limits. Atherosclerotic plaque of the aortic arch. Hyperinflation of the lungs. Trace biapical pleural/pulmonary scarring no pulmonary edema. Bibasilar scarring. Right airspace opacity of unclear etiology. Elevated left hemidiaphragm with likely atelectasis. Blunting of the bilateral costophrenic angles with trace pleural effusions not excluded. No pneumothorax. No acute osseous abnormality. IMPRESSION: 1. Right airspace opacity of unclear etiology. Followup PA and lateral chest X-ray is recommended in 3-4 weeks following therapy to ensure resolution and exclude underlying malignancy. 2. Elevated left hemidiaphragm with likely atelectasis. 3. Bibasilar scarring with blunting of the  bilateral costophrenic angles with trace pleural effusions not excluded. 4. Aortic Atherosclerosis (ICD10-I70.0) and Emphysema (ICD10-J43.9). Electronically Signed  By: Iven Finn M.D.   On: 11/30/2021 17:54          No data to display          No results found for: "NITRICOXIDE"      Assessment & Plan:   COPD (chronic obstructive pulmonary disease) ([place]) Resolving AECOPD. Clinically improved. Continue LABA/LAMA therapy. Close follow up should symptoms return/worsen.   Patient Instructions  Continue Stiolto 2 puffs daily  Continue Albuterol inhaler 2 puffs or 3 mL neb every 6 hours as needed for shortness of breath or wheezing. Notify if symptoms persist despite rescue inhaler/neb use.  Continue flonase 2 sprays each nostril daily Continue mucinex 600 mg Twice daily as needed for chest congestion  Continue supplemental oxygen 3 lpm with rest and activity. Goal oxygen >88-90%    Continue CPAP therapy nightly. Attend mask fitting at [facility] once symptoms improve, as previously ordered   Follow up with Dr. Lamonte Sakai in 3 months. If symptoms do not improve or worsen, please contact office for sooner follow up or seek emergency care.   Chronic respiratory failure (HCC) Improved. He was able to maintain oxygen saturations >93% on 3 lpm POC during walking oximetry today, which is his baseline. Continue to monitor for goal >88-90%  Obstructive sleep apnea Compliant with CPAP. We adjusted his settings at our visit on 11/9. He was also ordered mask fitting due to large leaks. Currently waiting on this. We will recheck download in 6 weeks to ensure his breakthrough events have lessened.     I spent 28 minutes of dedicated to the care of this patient on the date of this encounter to include pre-visit review of records, face-to-face time with the patient discussing conditions above, post visit ordering of testing, clinical documentation with the electronic health record, making  appropriate referrals as documented, and communicating necessary findings to members of the patients care team.  Clayton Bibles, NP 12/12/2021  Pt aware and understands NP's role.

## 2021-12-17 ENCOUNTER — Telehealth: Payer: Self-pay

## 2021-12-17 NOTE — Telephone Encounter (Signed)
Appt tomorrow w/ PCP

## 2021-12-17 NOTE — Telephone Encounter (Signed)
Nurse Assessment Nurse: Enid Derry, RN, Manuela Schwartz Date/Time Eilene Ghazi Time): 12/14/2021 10:55:28 AM Confirm and document reason for call. If symptomatic, describe symptoms. ---Caller states dad has arthritis: back pain, hand pain, joint pain. He has been trying to refill his Celebrex Rx since 11/18. Patient is alert and awake. He states pain is 0 right now. Last pill taken was Tuesday. Does the patient have any new or worsening symptoms? ---Yes Will a triage be completed? ---Yes Related visit to physician within the last 2 weeks? ---Yes Does the PT have any chronic conditions? (i.e. diabetes, asthma, this includes High risk factors for pregnancy, etc.) ---Yes List chronic conditions. ---arthritis, diabetes, COPD Is this a behavioral health or substance abuse call? ---No Nurse: Enid Derry, RN, Manuela Schwartz Date/Time Eilene Ghazi Time): 12/14/2021 11:04:51 AM Please select the assessment type ---Refill Additional Documentation ---Celebrex; states he has been trying to get a refill since Nov. 18 but the office has not responded to pharmacy. His daughter just discovered this today. PLEASE NOTE: All timestamps contained within this report are represented as [place] Standard Time. CONFIDENTIALTY NOTICE: This fax transmission is intended only for the addressee. It contains information that is legally privileged, confidential or otherwise protected from use or disclosure. If you are not the intended recipient, you are strictly prohibited from reviewing, disclosing, copying using or disseminating any of this information or taking any action in reliance on or regarding this information. If you have received this fax in error, please notify us immediately by telephone so that we can arrange for its return to [place]. Phone: [phone], Toll-Free: [phone], Fax: [phone] Page: 2 of 2 Call Id: [id number] Nurse Assessment Does the patient have enough medication to last until the office opens? ---No Additional  Documentation ---daughter will talk with pharmacy about loaner dose. Guidelines Guideline Title Affirmed Question Affirmed Notes Nurse Date/Time (Eastern Time) Back Pain [1] MODERATE back pain (e.g., interferes with normal activities) AND [2] present > 3 days Lujean Rave 97/98/9211 10:58:19 AM Disp. Time Eilene Ghazi Time) Disposition Final User 12/14/2021 11:04:35 AM SEE PCP WITHIN 3 DAYS Yes Enid Derry RN, Manuela Schwartz Final Disposition 12/14/2021 11:04:35 AM SEE PCP WITHIN 3 DAYS Yes Enid Derry, RN, Edwena Bunde Disagree/Comply Comply Caller Understands Yes PreDisposition Call Doctor Care Advice Given Per Guideline SEE PCP WITHIN 3 DAYS: * You need to be seen within 2 or 3 days. * PCP VISIT: Call your doctor (or NP/PA) during regular office hours and make an appointment. A clinic or urgent care center are good places to go for care if your doctor's office is closed or you can't get an appointment. NOTE: If office will be open tomorrow, tell caller to call then, not in 3 days. USE HEAT: * Use a heat pack, heating pad, or warm wet washcloth. * Do this for 10 minutes three times a day. ACTIVITY: * Continue normal activities as much as your pain allows. Staying active is more healing for the back than rest. * Avoid any activities that increase the pain a lot. PAIN MEDICINES: * For pain relief, you can take either acetaminophen, ibuprofen, or naproxen. * They are over-the-counter (OTC) pain drugs. You can buy them at the drugstore. * NAPROXEN (E.G., ALEVE): Take 220 mg (one 220 mg pill) by mouth every 8 to 12 hours as needed. You may take 440 mg (two 220 mg pills) for your first dose. The most you should take is 3 pills a day (660 mg total). Note: In [place], the maximum is 2 pills a day (one every 12 hours; 440  mg total). CALL BACK IF: * Numbness or weakness occurs, or bowel/bladder problems * There are any urine symptoms or fever * You become worse CARE ADVICE given per Back Pain (Adult)  guideline

## 2021-12-18 ENCOUNTER — Encounter: Payer: Self-pay | Admitting: Medical

## 2021-12-18 ENCOUNTER — Ambulatory Visit (INDEPENDENT_AMBULATORY_CARE_PROVIDER_SITE_OTHER): Payer: Medicare Other | Admitting: Medical

## 2021-12-18 ENCOUNTER — Telehealth: Payer: Self-pay | Admitting: Medical

## 2021-12-18 VITALS — BP 140/66 | HR 102 | Resp 20 | Wt 238.0 lb

## 2021-12-18 DIAGNOSIS — I10 Essential (primary) hypertension: Secondary | ICD-10-CM

## 2021-12-18 DIAGNOSIS — R0902 Hypoxemia: Secondary | ICD-10-CM

## 2021-12-18 DIAGNOSIS — M2550 Pain in unspecified joint: Secondary | ICD-10-CM

## 2021-12-18 DIAGNOSIS — Z794 Long term (current) use of insulin: Secondary | ICD-10-CM

## 2021-12-18 DIAGNOSIS — E118 Type 2 diabetes mellitus with unspecified complications: Secondary | ICD-10-CM | POA: Diagnosis not present

## 2021-12-18 DIAGNOSIS — J432 Centrilobular emphysema: Secondary | ICD-10-CM

## 2021-12-18 MED ORDER — CELECOXIB 100 MG PO CAPS: 100.0000 mg | ORAL_CAPSULE | Freq: Two times a day (BID) | ORAL | 1 refills | Status: DC

## 2021-12-18 NOTE — Patient Instructions (Signed)
Preop examination for eye surgery. 02 Sat better now. Closer to baseline. Will need to revaluate in a month or so to make sure you remain stable before surgery. Will keep form pending that recheck date.   Sugars today reasonable. Continue current diabetic meds.   Htn- bp well controlled.  For arthralgia did refill your celebrex.  Future labs orders. Can get done this week or early next week.  Routine follow up for chronic problems in 3 months from lab draw date.

## 2021-12-18 NOTE — Progress Notes (Signed)
   Subjective:    Patient ID: Lance Powell, male    DOB: Aug 25, 1929, 88 y.o.   MRN: [id number]  HPI Pt in for follow up. Pt doing well presently. On last visit he had preop visit for eye surgery. His o2 sat was 87-89% which was lower than usual. His chest xay showed possible pneumonia so I rx' doxy and did get pt scheduled with pulmonologist.   Pulmonology note portion below on last visit.  "12/12/2021: Today - follow up Patient presents today with his daughter for follow up. He was worked up for acute on chronic respiratory failure last visit, initially thought to be related to CAP. D dimer was obtained which was positive so he underwent stat CTA. There was no evidence of acute pulmonary process, including PE or pna. No evidence of volume overload. He was treated for AECOPD with prednisone burst and instructed to complete doxycycline course. Today, he is clinically improved. Feels like he's back to his baseline. They are working to wean his oxygen back; currently have him at 3.5 lpm. No increased cough or chest congestion. No fevers, chills, hemoptysis, leg swelling, orthopnea. Doing much better. Using stiolto daily."  Pt states pulmonology advised not to do surgery until at least 01-03-22. Pt now wants to wait until January.  Hx of osteoarthritis like pain. Pt used celebrex in the past and request refills.    Review of Systems     Objective:   Physical Exam  General- No acute distress. Pleasant patient. Neck- Full range of motion, no jvd Lungs- Clear, even and unlabored. Heart- regular rate and rhythm. Neurologic- CNII- XII grossly intact.       Assessment & Plan:   Preop examination for eye surgery. 02 Sat better now. Closer to baseline. Will need to revaluate in a month or so to make sure you remain stable before surgery. Will keep form pending that recheck date.   Sugars today reasonable. Continue current diabetic meds.   Htn- bp well controlled.  For arthralgia did  refill your celebrex.  Future labs orders. Can get done this week or early next week.  Routine follow up for chronic problems in 3 months from lab draw date.  [name], PA-C

## 2021-12-19 ENCOUNTER — Other Ambulatory Visit (HOSPITAL_BASED_OUTPATIENT_CLINIC_OR_DEPARTMENT_OTHER): Payer: Medicare Other

## 2021-12-21 MED ORDER — CELECOXIB 100 MG PO CAPS: 100.0000 mg | ORAL_CAPSULE | Freq: Two times a day (BID) | ORAL | 1 refills | Status: DC

## 2021-12-21 NOTE — Telephone Encounter (Signed)
Patient's daughter called stating the pharmacy does not have the prescription for Celecoxib and the pt is completely out. She needs it to be re-sent to the pharmacy as soon as possible so he can take it. Please advise.    [id number] - [place], [place] - [address] AT [facility] OF [address], [place] [address] Phone: [phone]  Fax: [phone]

## 2021-12-21 NOTE — Telephone Encounter (Signed)
Rx sent 

## 2021-12-21 NOTE — Addendum Note (Signed)
Addended by: Jeronimo Greaves on: 12/21/2021 10:55 AM   Modules accepted: Orders

## 2022-01-01 ENCOUNTER — Ambulatory Visit (HOSPITAL_BASED_OUTPATIENT_CLINIC_OR_DEPARTMENT_OTHER): Payer: Medicare Other | Attending: Nurse Practitioner | Admitting: Radiology

## 2022-01-01 DIAGNOSIS — G4733 Obstructive sleep apnea (adult) (pediatric): Secondary | ICD-10-CM

## 2022-01-18 ENCOUNTER — Telehealth: Payer: Self-pay | Admitting: Medical

## 2022-01-18 NOTE — Telephone Encounter (Signed)
Patient called concerned to why his medication (celecoxib (CELEBREX) 100 MG capsule ) went from '200mg'$  to '100mg'$ . Patient stated that it was not discussed with him. Patient would like a call back from the [place]. Best callback number is [phone].

## 2022-01-22 ENCOUNTER — Telehealth: Payer: Self-pay | Admitting: Family Medicine

## 2022-01-22 NOTE — Telephone Encounter (Signed)
Pt called and lvm to return call 

## 2022-01-23 MED ORDER — METFORMIN HCL 500 MG PO TABS: ORAL_TABLET | ORAL | 3 refills | Status: DC

## 2022-01-23 NOTE — Addendum Note (Signed)
Addended by: Jeronimo Greaves on: 01/23/2022 02:07 PM   Modules accepted: Orders

## 2022-01-23 NOTE — Telephone Encounter (Signed)
Rx re sent lol

## 2022-01-23 NOTE — Telephone Encounter (Signed)
Daughter states she spoke to the pharmacy yesterday and they have no received the metformin yet. She would like for it to be resent to them. Please advise.

## 2022-01-28 ENCOUNTER — Telehealth: Payer: Self-pay | Admitting: Medical

## 2022-01-28 ENCOUNTER — Other Ambulatory Visit: Payer: Self-pay | Admitting: Medical

## 2022-01-28 NOTE — Telephone Encounter (Signed)
Medication: celecoxib (CELEBREX) 100 MG capsule Pt states he is supposed to take '200mg'$  tablet once a day. Please advise.    Has the patient contacted their pharmacy? No.   Preferred Pharmacy:    [facility] #[id number] - [place], [place] - [address] AT [facility] [address], [place] [address] Phone: [phone]  Fax: [phone]

## 2022-01-29 NOTE — Telephone Encounter (Signed)
Pt called and lvm to return call 

## 2022-01-29 NOTE — Telephone Encounter (Signed)
[name] called back and advised her, per HG, that a virtual appt would be needed in order to go over all aspects of medication change. She stated that this had been discussed with Lance Powell in an appt already and wanted to see if he would be willing to up the medication back to '200mg'$ . Please Advise.

## 2022-01-30 MED ORDER — CELECOXIB 200 MG PO CAPS: ORAL_CAPSULE | ORAL | 3 refills | Status: DC

## 2022-01-30 NOTE — Addendum Note (Signed)
Addended by: Anabel Halon on: 01/30/2022 05:05 PM   Modules accepted: Orders

## 2022-01-30 NOTE — Telephone Encounter (Signed)
Patient's daughter called wanting a call back to discuss the pt's celecox medication [phone]). She states he has been on a higher dose already and does think another vv I needed. Please advise.

## 2022-01-31 NOTE — Telephone Encounter (Signed)
Lvm to notify of medication being sent

## 2022-02-13 ENCOUNTER — Encounter: Payer: Self-pay | Admitting: Physician Assistant

## 2022-02-21 ENCOUNTER — Telehealth: Payer: Self-pay | Admitting: Medical

## 2022-02-21 NOTE — Telephone Encounter (Signed)
Copied from [place] [phone]. Topic: Medicare AWV >> Feb 21, 2022  2:52 PM Devoria Glassing wrote: Reason for CRM: Attempted to schedule AWV. Unable to LVM.  Will try at later time.

## 2022-02-25 ENCOUNTER — Ambulatory Visit: Payer: Medicare Other | Admitting: Medical

## 2022-03-27 ENCOUNTER — Ambulatory Visit: Payer: Medicare Other | Admitting: Nurse Practitioner

## 2022-04-05 ENCOUNTER — Other Ambulatory Visit: Payer: Self-pay | Admitting: Medical

## 2022-05-06 ENCOUNTER — Encounter: Payer: Self-pay | Admitting: *Deleted

## 2022-05-09 ENCOUNTER — Telehealth: Payer: Self-pay | Admitting: Medical

## 2022-05-09 NOTE — Telephone Encounter (Signed)
Pt's daughter stated her other sister is in hospice so they have not been able to get th ept in to see pcp. He is scheduled to see him next week but needs a refill before then.   misoprostol (CYTOTEC) 100 MCG tablet    [id number] - [place], [place] - [address] AT [facility] [address], [place] [address] Phone: [phone]  Fax: [phone]

## 2022-05-10 MED ORDER — MISOPROSTOL 100 MCG PO TABS: ORAL_TABLET | ORAL | 3 refills | Status: DC

## 2022-05-10 NOTE — Telephone Encounter (Signed)
Pt.notified

## 2022-05-10 NOTE — Addendum Note (Signed)
Addended by: Gwenevere Abbot on: 05/10/2022 03:07 PM   Modules accepted: Orders

## 2022-05-10 NOTE — Telephone Encounter (Signed)
Daughter followed up on prescription for misoprostol. Please send in rx and call daughter to advise.

## 2022-05-14 ENCOUNTER — Emergency Department (HOSPITAL_COMMUNITY)
Admission: EM | Admit: 2022-05-14 | Discharge: 2022-05-16 | Disposition: A | Discharge status: Home / Self Care | Payer: Medicare Other | Attending: Emergency Medicine | Admitting: Emergency Medicine

## 2022-05-14 ENCOUNTER — Emergency Department (HOSPITAL_COMMUNITY): Payer: Medicare Other

## 2022-05-14 ENCOUNTER — Ambulatory Visit: Payer: Medicare Other | Admitting: Medical

## 2022-05-14 DIAGNOSIS — Z7982 Long term (current) use of aspirin: Secondary | ICD-10-CM | POA: Diagnosis not present

## 2022-05-14 DIAGNOSIS — M549 Dorsalgia, unspecified: Secondary | ICD-10-CM | POA: Diagnosis not present

## 2022-05-14 DIAGNOSIS — R531 Weakness: Secondary | ICD-10-CM

## 2022-05-14 DIAGNOSIS — I10 Essential (primary) hypertension: Secondary | ICD-10-CM | POA: Insufficient documentation

## 2022-05-14 DIAGNOSIS — Z7951 Long term (current) use of inhaled steroids: Secondary | ICD-10-CM | POA: Insufficient documentation

## 2022-05-14 DIAGNOSIS — Z87891 Personal history of nicotine dependence: Secondary | ICD-10-CM | POA: Diagnosis not present

## 2022-05-14 DIAGNOSIS — K6289 Other specified diseases of anus and rectum: Secondary | ICD-10-CM | POA: Insufficient documentation

## 2022-05-14 DIAGNOSIS — E119 Type 2 diabetes mellitus without complications: Secondary | ICD-10-CM | POA: Insufficient documentation

## 2022-05-14 DIAGNOSIS — J449 Chronic obstructive pulmonary disease, unspecified: Secondary | ICD-10-CM | POA: Insufficient documentation

## 2022-05-14 DIAGNOSIS — F0390 Unspecified dementia without behavioral disturbance: Secondary | ICD-10-CM | POA: Diagnosis not present

## 2022-05-14 DIAGNOSIS — R296 Repeated falls: Secondary | ICD-10-CM

## 2022-05-14 DIAGNOSIS — G8929 Other chronic pain: Secondary | ICD-10-CM | POA: Diagnosis not present

## 2022-05-14 DIAGNOSIS — R42 Dizziness and giddiness: Secondary | ICD-10-CM | POA: Insufficient documentation

## 2022-05-14 DIAGNOSIS — K5641 Fecal impaction: Secondary | ICD-10-CM

## 2022-05-14 DIAGNOSIS — Z7984 Long term (current) use of oral hypoglycemic drugs: Secondary | ICD-10-CM | POA: Diagnosis not present

## 2022-05-14 DIAGNOSIS — J441 Chronic obstructive pulmonary disease with (acute) exacerbation: Secondary | ICD-10-CM | POA: Insufficient documentation

## 2022-05-14 LAB — BLOOD GAS, VENOUS
Acid-Base Excess: 1.7 mmol/L (ref 0.0–2.0)
Bicarbonate: 28.2 mmol/L — ABNORMAL HIGH (ref 20.0–28.0)
O2 Saturation: 95.9 %
Patient temperature: 37
pCO2, Ven: 51 mmHg (ref 44–60)
pH, Ven: 7.35 (ref 7.25–7.43)
pO2, Ven: 68 mmHg — ABNORMAL HIGH (ref 32–45)

## 2022-05-14 LAB — COMPREHENSIVE METABOLIC PANEL
ALT: 20 U/L (ref 0–44)
AST: 17 U/L (ref 15–41)
Albumin: 3.5 g/dL (ref 3.5–5.0)
Alkaline Phosphatase: 68 U/L (ref 38–126)
Anion gap: 9 (ref 5–15)
BUN: 23 mg/dL (ref 8–23)
CO2: 27 mmol/L (ref 22–32)
Calcium: 9.9 mg/dL (ref 8.9–10.3)
Chloride: 105 mmol/L (ref 98–111)
Creatinine, Ser: 1.2 mg/dL (ref 0.61–1.24)
GFR, Estimated: 57 mL/min — ABNORMAL LOW (ref >60)
Glucose, Bld: 119 mg/dL — ABNORMAL HIGH (ref 70–99)
Potassium: 4 mmol/L (ref 3.5–5.1)
Sodium: 141 mmol/L (ref 135–145)
Total Bilirubin: 0.5 mg/dL (ref 0.3–1.2)
Total Protein: 7 g/dL (ref 6.5–8.1)

## 2022-05-14 LAB — URINALYSIS, ROUTINE W REFLEX MICROSCOPIC
Bacteria, UA: NONE SEEN
Bilirubin Urine: NEGATIVE
Glucose, UA: 50 mg/dL — AB
Ketones, ur: NEGATIVE mg/dL
Leukocytes,Ua: NEGATIVE
Nitrite: NEGATIVE
Protein, ur: NEGATIVE mg/dL
Specific Gravity, Urine: 1.018 (ref 1.005–1.030)
pH: 5 (ref 5.0–8.0)

## 2022-05-14 LAB — CBC WITH DIFFERENTIAL/PLATELET
Abs Immature Granulocytes: 0.02 10*3/uL (ref 0.00–0.07)
Basophils Absolute: 0 10*3/uL (ref 0.0–0.1)
Basophils Relative: 0 %
Eosinophils Absolute: 0.2 10*3/uL (ref 0.0–0.5)
Eosinophils Relative: 3 %
HCT: 44.1 % (ref 39.0–52.0)
Hemoglobin: 13.9 g/dL (ref 13.0–17.0)
Immature Granulocytes: 0 %
Lymphocytes Relative: 31 %
Lymphs Abs: 2.7 10*3/uL (ref 0.7–4.0)
MCH: 31.3 pg (ref 26.0–34.0)
MCHC: 31.5 g/dL (ref 30.0–36.0)
MCV: 99.3 fL (ref 80.0–100.0)
Monocytes Absolute: 1.1 10*3/uL — ABNORMAL HIGH (ref 0.1–1.0)
Monocytes Relative: 12 %
Neutro Abs: 4.7 10*3/uL (ref 1.7–7.7)
Neutrophils Relative %: 54 %
Platelets: 220 10*3/uL (ref 150–400)
RBC: 4.44 MIL/uL (ref 4.22–5.81)
RDW: 13.8 % (ref 11.5–15.5)
WBC: 8.7 10*3/uL (ref 4.0–10.5)
nRBC: 0 % (ref 0.0–0.2)

## 2022-05-14 LAB — AMMONIA: Ammonia: <10 umol/L (ref 9–35)

## 2022-05-14 LAB — PROTIME-INR
INR: 1 (ref 0.8–1.2)
Prothrombin Time: 13.3 seconds (ref 11.4–15.2)

## 2022-05-14 LAB — LACTIC ACID, PLASMA: Lactic Acid, Venous: 1.2 mmol/L (ref 0.5–1.9)

## 2022-05-14 MED ORDER — UMECLIDINIUM BROMIDE 62.5 MCG/ACT IN AEPB
1.0000 | INHALATION_SPRAY | Freq: Every day | RESPIRATORY_TRACT | Status: DC
  Administered 2022-05-16: 1 via RESPIRATORY_TRACT
  Filled 2022-05-14 (×2): qty 7

## 2022-05-14 MED ORDER — CELECOXIB 200 MG PO CAPS
200.0000 mg | ORAL_CAPSULE | Freq: Every evening | ORAL | Status: DC
  Administered 2022-05-14 – 2022-05-15 (×2): 200 mg via ORAL
  Filled 2022-05-14 (×3): qty 1

## 2022-05-14 MED ORDER — MISOPROSTOL 100 MCG PO TABS
100.0000 ug | ORAL_TABLET | Freq: Every day | ORAL | Status: DC
  Filled 2022-05-14: qty 1

## 2022-05-14 MED ORDER — METFORMIN HCL 500 MG PO TABS
500.0000 mg | ORAL_TABLET | Freq: Three times a day (TID) | ORAL | Status: DC
  Administered 2022-05-14 – 2022-05-16 (×5): 500 mg via ORAL
  Filled 2022-05-14 (×5): qty 1

## 2022-05-14 MED ORDER — MISOPROSTOL 200 MCG PO TABS
200.0000 ug | ORAL_TABLET | Freq: Every day | ORAL | Status: DC
  Administered 2022-05-14 – 2022-05-15 (×2): 200 ug via ORAL
  Filled 2022-05-14 (×2): qty 1

## 2022-05-14 MED ORDER — FLEET ENEMA 7-19 GM/118ML RE ENEM
1.0000 | ENEMA | Freq: Once | RECTAL | Status: AC
  Administered 2022-05-14: 1 via RECTAL
  Filled 2022-05-14: qty 1

## 2022-05-14 MED ORDER — ASPIRIN 81 MG PO CHEW
81.0000 mg | CHEWABLE_TABLET | Freq: Every morning | ORAL | Status: DC
  Administered 2022-05-15 – 2022-05-16 (×2): 81 mg via ORAL
  Filled 2022-05-14 (×2): qty 1

## 2022-05-14 MED ORDER — PSYLLIUM 95 % PO PACK
1.0000 | PACK | Freq: Every day | ORAL | Status: DC
  Administered 2022-05-16: 1 via ORAL
  Filled 2022-05-14 (×2): qty 1

## 2022-05-14 MED ORDER — ATORVASTATIN CALCIUM 40 MG PO TABS
40.0000 mg | ORAL_TABLET | Freq: Every day | ORAL | Status: DC
  Administered 2022-05-14 – 2022-05-16 (×3): 40 mg via ORAL
  Filled 2022-05-14 (×3): qty 1

## 2022-05-14 MED ORDER — PIOGLITAZONE HCL 15 MG PO TABS
15.0000 mg | ORAL_TABLET | Freq: Every day | ORAL | Status: DC
  Administered 2022-05-16: 15 mg via ORAL
  Filled 2022-05-14 (×2): qty 1

## 2022-05-14 MED ORDER — POLYETHYLENE GLYCOL 3350 17 G PO PACK
17.0000 g | PACK | Freq: Every day | ORAL | Status: DC
  Administered 2022-05-14 – 2022-05-16 (×3): 17 g via ORAL
  Filled 2022-05-14 (×3): qty 1

## 2022-05-14 MED ORDER — ALBUTEROL SULFATE HFA 108 (90 BASE) MCG/ACT IN AERS: 2.0000 | INHALATION_SPRAY | Freq: Four times a day (QID) | RESPIRATORY_TRACT | Status: DC | PRN

## 2022-05-14 MED ORDER — ARFORMOTEROL TARTRATE 15 MCG/2ML IN NEBU
15.0000 ug | INHALATION_SOLUTION | Freq: Two times a day (BID) | RESPIRATORY_TRACT | Status: DC
  Administered 2022-05-14 – 2022-05-16 (×3): 15 ug via RESPIRATORY_TRACT
  Filled 2022-05-14 (×5): qty 2

## 2022-05-14 MED ORDER — ADULT MULTIVITAMIN W/MINERALS CH
1.0000 | ORAL_TABLET | Freq: Every morning | ORAL | Status: DC
  Administered 2022-05-15 – 2022-05-16 (×2): 1 via ORAL
  Filled 2022-05-14 (×2): qty 1

## 2022-05-14 MED ORDER — MISOPROSTOL 100 MCG PO TABS: 100.0000 ug | ORAL_TABLET | Freq: Every evening | ORAL | Status: DC

## 2022-05-14 MED ORDER — MISOPROSTOL 100 MCG PO TABS: 100.0000 ug | ORAL_TABLET | Freq: Two times a day (BID) | ORAL | Status: DC

## 2022-05-14 NOTE — ED Provider Notes (Signed)
[place] EMERGENCY DEPARTMENT AT [facility] Provider Note   CSN: [id number] Arrival date & time: 05/14/22  1335     History  Chief Complaint  Patient presents with   Back Pain   Weakness    Lance Powell is a 89 y.o. male, hx of DM II, COPD, dementia, who presents to the ED 2/2 to debility, weakness, it has been occurring over the last couple weeks.  Per daughter, he has had multiple falls in the last couple days, including hitting his head, and he is not eating or drinking well, and keeps calling them in a panic.  He states she states that her sister has prion disease, and her other sister has metastatic cancer, and they cannot take care of them.  She states that he has something wrong with him, because he complains about being dizzy all the time.  He denies any chest pain, but does state that he has had a large amount of stool in his rectum, and often has to date out himself.  Also states that he has some chronic back pain, that is been worse since falling.    Home Medications Prior to Admission medications   Medication Sig Start Date End Date Taking? Authorizing Provider  albuterol (VENTOLIN HFA) 108 (90 Base) MCG/ACT inhaler Inhale 2 puffs into the lungs every 6 (six) hours as needed for wheezing or shortness of breath. 11/29/21  Yes Cobb, Ruby Cola, NP  aspirin 81 MG tablet Take 1 tablet (81 mg total) by mouth every morning. Stop for 1 week and then resume Patient taking differently: Take 81 mg by mouth every morning. 01/08/12  Yes Vassie Loll, MD  atorvastatin (LIPITOR) 40 MG tablet TAKE 1 TABLET BY MOUTH EVERY DAY Patient taking differently: Take 40 mg by mouth daily. 07/17/21  Yes [name], Ramon Dredge, PA-C  celecoxib (CELEBREX) 200 MG capsule 1 tab po daily prn pain(advise use sparingly based on age and kidney function) Patient taking differently: Take 200 mg by mouth at bedtime. (advise use sparingly based on age and kidney function) 01/30/22  Yes [name], Ramon Dredge,  PA-C  Cholecalciferol (VITAMIN D) 50 MCG (2000 UT) tablet Take 2,000 Units by mouth 2 (two) times daily.   Yes [provider]  metFORMIN (GLUCOPHAGE) 500 MG tablet TAKE 1 TABLET(500 MG) BY MOUTH THREE TIMES DAILY Patient taking differently: Take 500 mg by mouth See admin instructions. Take 500 mg by mouth in the morning, midday, and evening 04/05/22  Yes [name], Ramon Dredge, PA-C  misoprostol (CYTOTEC) 100 MCG tablet TAKE 1 TABLET BY MOUTH TWICE DAILY AND 2 TABLETS AT BEDTIME Patient taking differently: Take 100-200 mcg by mouth See admin instructions. Take 100 mcg by mouth in the morning and midday. Take 200 mcg by mouth in the evening. 05/10/22  Yes [name], Ramon Dredge, PA-C  Multiple Vitamins-Minerals (CENTRUM) tablet Take 1 tablet by mouth daily with breakfast.   Yes [provider]  OXYGEN Inhale 3 L/min into the lungs continuous.   Yes [provider]  pioglitazone (ACTOS) 15 MG tablet TAKE 1 TABLET(15 MG) BY MOUTH DAILY Patient taking differently: Take 15 mg by mouth daily with lunch. 04/23/21  Yes [name], Ramon Dredge, PA-C  PRESCRIPTION MEDICATION Inhale 1 Device into the lungs See admin instructions. CPAP- At bedtime   Yes [provider]  SYSTANE HYDRATION PF 0.4-0.3 % SOLN Place 1 drop into both eyes 3 (three) times daily.   Yes [provider]  Tiotropium Bromide-Olodaterol (STIOLTO RESPIMAT) 2.5-2.5 MCG/ACT AERS Inhale 2 puffs  into the lungs daily. 04/04/21  Yes Cobb, Ruby Cola, NP  venlafaxine XR (EFFEXOR-XR) 37.5 MG 24 hr capsule TAKE 1 CAPSULE(37.5 MG) BY MOUTH DAILY WITH BREAKFAST Patient taking differently: Take 37.5 mg by mouth daily with breakfast. 06/19/21  Yes [name], Ramon Dredge, PA-C  vitamin C (ASCORBIC ACID) 500 MG tablet Take 500 mg by mouth every morning.    Yes [provider]  Wheat Dextrin (BENEFIBER DRINK MIX PO) Take 1 packet by mouth daily as needed (for constipation).   Yes [provider]  blood glucose meter kit and  supplies Dispense based on patient and insurance preference. Use up to two times daily as directed. (FOR ICD-10 E10.9, E11.9). 12/07/21   [name], Ramon Dredge, PA-C  colestipol (COLESTID) 1 g tablet Take 2 tablets (2 g total) by mouth 2 (two) times daily. Patient not taking: Reported on 05/14/2022 08/13/21   Unk Lightning, [place]  CONTOUR NEXT TEST test strip USE AS DIRECTED TO CHECK BLOOD SUGAR TWICE DAILY 04/13/21   [name], Ramon Dredge, PA-C  doxycycline (VIBRA-TABS) 100 MG tablet Take 1 tablet (100 mg total) by mouth 2 (two) times daily. Patient not taking: Reported on 05/14/2022 12/01/21   [name], Ramon Dredge, PA-C  Spacer/Aero-Holding Chambers DEVI 1 Product by Does not apply route daily. 11/02/20   Leslye Peer, MD  Zoster Vaccine Adjuvanted [facility]) injection Inject into the muscle. Patient not taking: Reported on 05/14/2022 03/05/21   Judyann Munson, MD      Allergies    Patient has no known allergies.    Review of Systems   Review of Systems  Musculoskeletal:  Positive for back pain.  Neurological:  Positive for weakness.    Physical Exam Updated Vital Signs BP 116/70 (BP Location: Right Arm)   Pulse 92   Temp 97.6 F (36.4 C) (Oral)   Resp 15   Ht 6' (1.829 m)   Wt 106.6 kg   SpO2 95%   BMI 31.87 kg/m  Physical Exam Vitals and nursing note reviewed. Exam conducted with a chaperone present.  Constitutional:      General: He is not in acute distress.    Appearance: He is well-developed.  HENT:     Head: Normocephalic and atraumatic.  Eyes:     Conjunctiva/sclera: Conjunctivae normal.  Cardiovascular:     Rate and Rhythm: Normal rate and regular rhythm.     Heart sounds: No murmur heard. Pulmonary:     Effort: Pulmonary effort is normal. No respiratory distress.     Breath sounds: Normal breath sounds.  Abdominal:     Palpations: Abdomen is soft.     Tenderness: There is no abdominal tenderness.  Genitourinary:    Comments: Large amount of stool in  rectum Musculoskeletal:        General: No swelling.     Cervical back: Neck supple.  Skin:    General: Skin is warm and dry.     Capillary Refill: Capillary refill takes less than 2 seconds.  Neurological:     Mental Status: He is alert.  Psychiatric:        Mood and Affect: Mood normal.     ED Results / Procedures / Treatments   Labs (all labs ordered are listed, but only abnormal results are displayed) Labs Reviewed  COMPREHENSIVE METABOLIC PANEL - Abnormal; Notable for the following components:      Result Value   Glucose, Bld 119 (*)    GFR, Estimated 57 (*)    All other components within normal  limits  CBC WITH DIFFERENTIAL/PLATELET - Abnormal; Notable for the following components:   Monocytes Absolute 1.1 (*)    All other components within normal limits  URINALYSIS, ROUTINE W REFLEX MICROSCOPIC - Abnormal; Notable for the following components:   Glucose, UA 50 (*)    Hgb urine dipstick MODERATE (*)    All other components within normal limits  BLOOD GAS, VENOUS - Abnormal; Notable for the following components:   pO2, Ven 68 (*)    Bicarbonate 28.2 (*)    All other components within normal limits  CULTURE, BLOOD (ROUTINE X 2)  CULTURE, BLOOD (ROUTINE X 2)  LACTIC ACID, PLASMA  PROTIME-INR  AMMONIA    EKG None  Radiology CT Head Wo Contrast  Result Date: 05/14/2022 CLINICAL DATA:  Fall, gradual decline EXAM: CT HEAD WITHOUT CONTRAST TECHNIQUE: Contiguous axial images were obtained from the base of the skull through the vertex without intravenous contrast. RADIATION DOSE REDUCTION: This exam was performed according to the departmental dose-optimization program which includes automated exposure control, adjustment of the mA and/or kV according to patient size and/or use of iterative reconstruction technique. COMPARISON:  05/06/2021 FINDINGS: Brain: No evidence of acute infarction, hemorrhage, mass, mass effect, or midline shift. No hydrocephalus or extra-axial fluid  collection. Age related cerebral atrophy, with ex vacuo dilatation the ventricles. Periventricular white matter changes, likely the sequela of chronic [place]ay vessel ischemic disease. Vascular: No hyperdense vessel. Skull: Negative for fracture or focal lesion. Sinuses/Orbits: No acute finding. Other: The mastoid air cells are well aerated. IMPRESSION: No acute intracranial process. Electronically Signed   By: Wiliam Ke M.D.   On: 05/14/2022 17:05   CT Cervical Spine Wo Contrast  Result Date: 05/14/2022 CLINICAL DATA:  Increased confusion, gradual decline, neck trauma EXAM: CT CERVICAL SPINE WITHOUT CONTRAST TECHNIQUE: Multidetector CT imaging of the cervical spine was performed without intravenous contrast. Multiplanar CT image reconstructions were also generated. RADIATION DOSE REDUCTION: This exam was performed according to the departmental dose-optimization program which includes automated exposure control, adjustment of the mA and/or kV according to patient size and/or use of iterative reconstruction technique. COMPARISON:  05/06/2021 FINDINGS: Alignment: Straightening of the normal cervical lordosis. Redemonstrated trace anterolisthesis of C4 on C5. No traumatic listhesis. Skull base and vertebrae: No acute fracture. No primary bone lesion or focal pathologic process. Soft tissues and spinal canal: No prevertebral fluid or swelling. No visible canal hematoma. Disc levels: Degenerative changes in the cervical spine, with mild-to-moderate spinal canal stenosis at C5-C6 and C6-C7. Multilevel uncovertebral and facet arthropathy. Upper chest: No focal pulmonary opacity or pleural effusion imaged apices. Emphysema. Other: None. IMPRESSION: 1. No acute fracture or traumatic listhesis. 2. Degenerative changes in the cervical spine, with mild-to-moderate spinal canal stenosis at C5-C6 and C6-C7. Electronically Signed   By: Wiliam Ke M.D.   On: 05/14/2022 16:56   CT Lumbar Spine Wo Contrast  Result Date:  05/14/2022 CLINICAL DATA:  Left lower back pain after fall on Friday EXAM: CT LUMBAR SPINE WITHOUT CONTRAST TECHNIQUE: Multidetector CT imaging of the lumbar spine was performed without intravenous contrast administration. Multiplanar CT image reconstructions were also generated. RADIATION DOSE REDUCTION: This exam was performed according to the departmental dose-optimization program which includes automated exposure control, adjustment of the mA and/or kV according to patient size and/or use of iterative reconstruction technique. COMPARISON:  08/22/2021 FINDINGS: Segmentation: 5 lumbar type vertebrae. Alignment: Normal. Vertebrae: No acute fracture or focal pathologic process. Paraspinal and other soft tissues: The paraspinal soft tissues are unremarkable.  Nonobstructing 11 mm right renal calculus is again identified. There is diffuse atherosclerosis of the aorta and its branches again noted. Diverticulosis of the sigmoid colon without evidence of diverticulitis. Multiple bladder diverticula are again noted unchanged. Disc levels: At L1-2, there is mild circumferential disc bulge without significant compressive sequela. At L2-3, L3-4, and L4-5 there is circumferential disc bulge with bilateral facet and ligamentum flavum hypertrophy, contributing to mild-to-moderate central canal stenosis and bilateral neural foraminal encroachment. Neural foraminal encroachment is most pronounced at the L3-4 and L4-5 levels. Reconstructed images demonstrate no additional findings. IMPRESSION: 1. No acute lumbar spine fracture. 2. Extensive multilevel lumbar spondylosis and facet hypertrophy, greatest at L3-4 and L4-5. 3. Stable nonobstructing 11 mm right renal calculus. 4. Sigmoid diverticulosis without evidence of diverticulitis. 5.  Aortic Atherosclerosis (ICD10-I70.0). Electronically Signed   By: Sharlet Salina M.D.   On: 05/14/2022 16:54   DG Abdomen 1 View  Result Date: 05/14/2022 CLINICAL DATA:  Back pain, difficulty  with bowel movements, evaluate for obstruction EXAM: ABDOMEN - 1 VIEW COMPARISON:  None Available. FINDINGS: Nonobstructive pattern of bowel gas with gas and stool present to the rectum. Scattered stool in the colon without large burden. No free air in the abdomen on supine radiographs. Severe, asymmetric arthrosis of the right hip. IMPRESSION: Nonobstructive pattern of bowel gas with gas and stool present to the rectum. Scattered stool in the colon without large burden. Electronically Signed   By: Jearld Lesch M.D.   On: 05/14/2022 15:17   DG Chest 2 View  Result Date: 05/14/2022 CLINICAL DATA:  Suspected sepsis EXAM: CHEST - 2 VIEW COMPARISON:  11/30/2021 FINDINGS: The heart size and mediastinal contours are within normal limits. Unchanged scarring or atelectasis of the lung bases. The visualized skeletal structures are unremarkable. IMPRESSION: Unchanged scarring or atelectasis of the lung bases. No acute airspace opacity. Electronically Signed   By: Jearld Lesch M.D.   On: 05/14/2022 14:44    Procedures Procedures    Medications Ordered in ED Medications  albuterol (VENTOLIN HFA) 108 (90 Base) MCG/ACT inhaler 2 puff (has no administration in time range)  aspirin tablet 81 mg (has no administration in time range)  atorvastatin (LIPITOR) tablet 40 mg (has no administration in time range)  misoprostol (CYTOTEC) tablet 100 mcg (has no administration in time range)  misoprostol (CYTOTEC) tablet 100 mcg (has no administration in time range)  Centrum 1 tablet (has no administration in time range)  pioglitazone (ACTOS) tablet 15 mg (has no administration in time range)  psyllium (METAMUCIL) 58.6 % packet 1 packet (has no administration in time range)  metFORMIN (GLUCOPHAGE) tablet 500 mg (has no administration in time range)  arformoterol (BROVANA) nebulizer solution 15 mcg (has no administration in time range)    And  umeclidinium bromide (INCRUSE ELLIPTA) 62.5 MCG/ACT 1 puff (has no  administration in time range)  celecoxib (CELEBREX) capsule 200 mg (has no administration in time range)  polyethylene glycol (MIRALAX / GLYCOLAX) packet 17 g (has no administration in time range)  sodium phosphate (FLEET) 7-19 GM/118ML enema 1 enema (1 enema Rectal Given 05/14/22 2023)    ED Course/ Medical Decision Making/ A&P Clinical Course as of 05/14/22 2149  Tue May 14, 2022  1452 Stable 89 YOM with a chief complaint of frequent falls at home Has fatigue and dizziness as well as low back pain.  [CC]    Clinical Course User Index [CC] Glyn Ade, MD  Medical Decision Making Patient is a 89 year old male, history of dementia, who presents to the ED secondary to rectal pain, constipation, and frequent falls for the last couple days including hitting his head.  He has a history of constipation, and has just not been going.  On exam he has large amount of stool in his rectum, we obtained a KUB of this as well, and it shows stool in the rectum, I disimpacted him, and he had an enema with great amount of output.  Additionally we obtained a CT head, neck, low back, is due to his frequent falls, and a otherwise infectious/AMS workup.  He appears alert be alert and oriented on my exam, but does not want to get out of bed, and will not sit up by himself.  However he has no focal deficits.  He is on oxygen at home.  I spoke with Dr. Doran Durand regarding this patient's plan as well.  Amount and/or Complexity of Data Reviewed Labs: ordered.    Details: Labs are unremarkable Radiology: ordered.    Details: CTs are fairly unremarkable should just shows chronic findings no acute findings.  No evidence of head bleed or severe spinal stenosis or fracture Discussion of management or test interpretation with external provider(s): Discussed with family at bedside, results are reassuring, I spoke with hospitalist, who declined admission given that there is no medical  criteria met, I spoke with the family, and we discussed family involvement, with home health care, versus SNF, they would like to pursue possible SNF, and I put a PT OT eval as well as a social work eval in.  I reconciled all of his medications, and he did have good rectal tone thus I do not think this is cauda equina.  Additionally has no severe spinal stenosis.  He will need to be evaluated by PT/OT/case management, for further treatment.  He is well-appearing on my exam, and has no focal deficits.  Risk OTC drugs. Prescription drug management.   Final Clinical Impression(s) / ED Diagnoses Final diagnoses:  Recurrent falls  Weakness  Fecal impaction    Rx / DC Orders ED Discharge Orders     None         Fayola Meckes, Harley [place], PA 05/14/22 2149    Glyn Ade, MD 05/15/22 [phone]

## 2022-05-14 NOTE — ED Triage Notes (Signed)
Pt BIBA for LL back pain from fall Friday. C/o rectal pain d/t constipation. Daughter reports gradual decline over 2 weeks especially this weekend. Increased confusion, decreased appetite and output.  160/90 HR 100 RR 24 95% 3L [place] baseline CBG 147

## 2022-05-14 NOTE — Progress Notes (Signed)
TOC CSW currently awaiting PT recommendations.    Lashon Hillier Tarpley-Carter, MSW, LCSW-A Pronouns:  She/Her/Hers [facility]itions of Care Clinical Social Worker Direct Number:  [phone] [email or web address]  

## 2022-05-15 ENCOUNTER — Emergency Department (HOSPITAL_COMMUNITY): Payer: Medicare Other

## 2022-05-15 MED ORDER — MISOPROSTOL 100 MCG PO TABS
100.0000 ug | ORAL_TABLET | Freq: Two times a day (BID) | ORAL | Status: DC
  Administered 2022-05-15 – 2022-05-16 (×2): 100 ug via ORAL
  Filled 2022-05-15 (×4): qty 1

## 2022-05-15 NOTE — ED Provider Notes (Signed)
CT scan followed up.  Multiple chronic findings including Lumbar spondylosis and degenerative disc disease causing bilateral foraminal impingement at L3-4 and L4-5.  May be accounting for the patient's back pain.  However no obstructing ureteral stones or other acute intra-abdominal findings.  Will continue with current plan of management.   Arby Barrette, MD 05/15/22 [phone]

## 2022-05-15 NOTE — Progress Notes (Addendum)
Transition of Care [facility]) - Emergency Department Mini Assessment   Patient Details  Name: Lance Powell MRN: [id number] Date of Birth: [birth year]  [facility] CM/SW Contact:    Princella Ion, LCSW Phone Number: 05/15/2022, 8:53 AM   Clinical Narrative: Pt presented to ED with back pain, constipation, increased confusion, and gradual decline over the 2 weeks. This CSW contacted pt's daughter, Lance Powell, who is pt's POA. Lance Powell stated she is open to her father going to short term rehab due to his decline over the past couple weeks. Maureen informed she has stage 4 cancer to her bones and her sister has a rare brain disorder. She states they are in no shape to take care of their father but reports she is doing her best to assist him. Encouraged Lance Powell to begin Medicaid application at DSS if family is looking for LTC. Maureen verbalized understanding. This CSW explained SNF process and provided Medicare.gov site to view SNFs. Lance Powell requested that this CSW provide the information via text due to her driving. This CSW informed Lance Powell she'd come down to speak with her at bedside once PT has seen the pt. TOC following for PT recommendations.    ED Mini Assessment: What brought you to the Emergency Department? : fall, increased confusion, back pain  Barriers to Discharge: No Barriers Identified     Means of departure: Not know       Patient Contact and Communications Key Contact 1: Lance Powell (daughter)   Spoke with: Lance Powell (daughter) Contact Date: 05/15/22,   Contact time: 0835 Contact Phone Number: [phone] Call outcome: Daughter wishes for pt to go to STR at [facility]    CMS Medicare.gov Compare Post Acute Care list provided to:: Patient Represenative (must comment) Choice offered to / list presented to : Adult Children  Admission diagnosis:  FTT Patient Active Problem List   Diagnosis Date Noted   Chronic diarrhea 07/11/2021   Dementia 03/14/2021   Accident  due to mechanical fall without injury 06/11/2019   Sprain of medial collateral ligament of right knee 04/08/2019   Prepatellar bursitis of right knee 04/08/2019   Chronic respiratory failure 11/06/2017   OA (osteoarthritis) of hip 07/27/2014   Abrasion 07/27/2014   Insomnia 06/27/2014   Chronic cholecystitis 06/16/2014   Coccyx pain 03/30/2014   Diabetes mellitus type II, controlled 03/08/2014   Depression 03/08/2014   GERD (gastroesophageal reflux disease) 03/08/2014   Hyperlipidemia 03/08/2014   Abdominal pain    Diverticulitis of large intestine without perforation or abscess without bleeding    Blood poisoning    Calculus of gallbladder with acute cholecystitis 02/27/2014   Acute cholecystitis    Diverticulitis 02/25/2014   Urinary retention 02/25/2014   Hypertension 02/25/2014   SBO (small bowel obstruction) 02/25/2014   Sepsis 02/25/2014   Cholecystitis 02/25/2014   Slow transit constipation 02/25/2014   Seborrheic dermatitis of scalp 11/24/2013   Diarrhea 07/12/2013   Contusion of multiple sites 05/24/2013   ED (erectile dysfunction) of organic origin 05/06/2013   Seborrheic keratosis 05/06/2013   Cough 02/17/2013   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   Acute maxillary sinusitis 10/30/2012   Allergic rhinitis 06/27/2012   Hypertrophic and atrophic condition of skin 06/27/2012   Increased frequency of urination 06/27/2012   Lower urinary tract infectious disease 06/27/2012   Major depressive disorder, single episode, unspecified 06/27/2012   Malaise and fatigue 06/27/2012   Psychosexual dysfunction with inhibited sexual excitement 06/27/2012   Personal history of colonic polyps 01/06/2012   Special  screening for malignant neoplasms, colon 11/15/2011   Pruritus ani 11/15/2011   COPD (chronic obstructive pulmonary disease) 01/11/2011   DOE (dyspnea on exertion) 01/01/2011   Fissure in ano 12/21/2010   HYPERLIPIDEMIA 01/11/2009   Obstructive sleep apnea 01/11/2009    ARTHRITIS, RHEUMATOID 01/11/2009   OSTEOARTHRITIS 01/11/2009   PCP:  Esperanza Richters, PA-C Pharmacy:   [id number] - [place], [place] - [address] [place] [id number] Phone: [phone] Fax: [phone]

## 2022-05-15 NOTE — Progress Notes (Signed)
CSW has checked AUTH, at this time AUTH is still pending.

## 2022-05-15 NOTE — Progress Notes (Addendum)
Presented bed offers to pt's daughter, Lance Powell. She requests that this csw outreach to [facility] and [facility].   [facility]- Will review but no bed availability. [facility]- no response as of 12:37 PM  Addend @ 12:56 PM [facility]. Presented to daughter. Daughter accepted [facility]. Auth initiated.

## 2022-05-15 NOTE — Evaluation (Signed)
Occupational Therapy Evaluation Patient Details Name: Lance Powell MRN: [id number] DOB: 1929-05-29 Today's Date: 05/15/2022   History of Present Illness Patient is a 89 year old male who presented on 4/23 with weakness and debility with multiple falls in last few days that include hitting head. PMH: dementia, O2 use at home, COPD, DM II.   Clinical Impression   Patient is a 89 year old male who was admitted for above. Patient was living at home alone at rollator level. Currently, patient has poor safety awareness, decreased functional activity tolerance, decreased standing balance and increased pain in back impacting participation in ADLs. Patient does not have any additional support at home and is currently not at independent level to be able to transition home alone safely. Patient would continue to benefit from skilled OT services at this time while admitted and after d/c to address noted deficits in order to improve overall safety and independence in ADLs.        Recommendations for follow up therapy are one component of a multi-disciplinary discharge planning process, led by the attending physician.  Recommendations may be updated based on patient status, additional functional criteria and insurance authorization.   Assistance Recommended at Discharge Frequent or constant Supervision/Assistance  Patient can return home with the following A lot of help with bathing/dressing/bathroom;Direct supervision/assist for medications management;Direct supervision/assist for financial management;Assist for transportation;Assistance with cooking/housework;A lot of help with walking and/or transfers    Functional Status Assessment  Patient has had a recent decline in their functional status and demonstrates the ability to make significant improvements in function in a reasonable and predictable amount of time.  Equipment Recommendations  None recommended by OT       Precautions / Restrictions  Precautions Precautions: Fall Restrictions Weight Bearing Restrictions: No      Mobility Bed Mobility Overal bed mobility: Needs Assistance Bed Mobility: Supine to Sit, Sit to Supine     Supine to sit: Min assist Sit to supine: Mod assist, +2 for physical assistance, +2 for safety/equipment   General bed mobility comments: with increased time, patient needed +2 to get back into ED stretcher with physical A for  trunk control and BLE          Balance Overall balance assessment: Mild deficits observed, not formally tested             ADL either performed or assessed with clinical judgement   ADL Overall ADL's : Needs assistance/impaired Eating/Feeding: Set up;Sitting Eating/Feeding Details (indicate cue type and reason): in ED bed Grooming: Set up;Sitting   Upper Body Bathing: Sitting;Set up   Lower Body Bathing: Maximal assistance;Bed level   Upper Body Dressing : Sitting;Set up   Lower Body Dressing: Sitting/lateral leans;Maximal assistance   Toilet Transfer: Minimal assistance;Rolling walker (2 wheels);Ambulation Toilet Transfer Details (indicate cue type and reason): in room with patient noted to be unsteady. Toileting- Clothing Manipulation and Hygiene: Maximal assistance;Sit to/from stand Toileting - Clothing Manipulation Details (indicate cue type and reason): patient reported he normally sits to compelte hygiene tasks at home. was able to reach some but not all of perineal area in standing with one UE support on RW             Vision Baseline Vision/History: 4 Cataracts Additional Comments: has sunglassess with reported light sensitivitiy from cataract surgery years prior to this admission. good safety awareness scanning floor for wires when up and moving.            Pertinent Vitals/Pain  Pain Assessment Pain Assessment: 0-10 Pain Score: 6  Pain Location: L side of back Pain Descriptors / Indicators: Moaning, Discomfort, Guarding Pain  Intervention(s): Limited activity within patient's tolerance, Monitored during session     Hand Dominance Right   Extremity/Trunk Assessment Upper Extremity Assessment Upper Extremity Assessment: Generalized weakness   Lower Extremity Assessment Lower Extremity Assessment: Defer to PT evaluation   Cervical / Trunk Assessment Cervical / Trunk Assessment: Normal   Communication Communication Communication: HOH   Cognition Arousal/Alertness: Awake/alert Behavior During Therapy: WFL for tasks assessed/performed Overall Cognitive Status: Within Functional Limits for tasks assessed           General Comments: some confusion with orientation questions,                Home Living Family/patient expects to be discharged to:: Private residence Living Arrangements: Alone Available Help at Discharge: Family;Available PRN/intermittently Type of Home: House Home Access: Stairs to enter Entrance Stairs-Number of Steps: 1   Home Layout: One level     Bathroom Shower/Tub: Runner, broadcasting/film/video: Rollator (4 wheels);Shower seat          Prior Functioning/Environment Prior Level of Function : Independent/Modified Independent               ADLs Comments: family helps with grocery shopping. meals IADLs, I for ADLs        OT Problem List: Decreased strength;Decreased range of motion;Decreased cognition;Decreased activity tolerance;Decreased safety awareness;Impaired balance (sitting and/or standing);Decreased knowledge of precautions;Pain      OT Treatment/Interventions: Self-care/ADL training;Therapeutic exercise;Therapeutic activities;Neuromuscular education;Energy conservation;DME and/or AE instruction;Balance training    OT Goals(Current goals can be found in the care plan section) Acute Rehab OT Goals Patient Stated Goal: to get back home OT Goal Formulation: With patient Time For Goal Achievement: 05/29/22 Potential to Achieve Goals: Fair   OT Frequency: Min 2X/week    Co-evaluation PT/OT/SLP Co-Evaluation/Treatment: Yes Reason for Co-Treatment: To address functional/ADL transfers PT goals addressed during session: Mobility/safety with mobility OT goals addressed during session: ADL's and self-care      AM-PAC OT "6 Clicks" Daily Activity     Outcome Measure Help from another person eating meals?: A Little Help from another person taking care of personal grooming?: A Little Help from another person toileting, which includes using toliet, bedpan, or urinal?: A Lot Help from another person bathing (including washing, rinsing, drying)?: A Lot Help from another person to put on and taking off regular upper body clothing?: A Little Help from another person to put on and taking off regular lower body clothing?: A Lot 6 Click Score: 15   End of Session Equipment Utilized During Treatment: Gait belt;Rolling walker (2 wheels) Nurse Communication: Other (comment) (patients pain levels and request for drink)  Activity Tolerance: Patient tolerated treatment well Patient left: in bed;with call bell/phone within reach (in ED)  OT Visit Diagnosis: Unsteadiness on feet (R26.81);Other abnormalities of gait and mobility (R26.89);History of falling (Z91.81)                Time: 1610-9604 OT Time Calculation (min): 24 min Charges:  OT General Charges $OT Visit: 1 Visit OT Evaluation $OT Eval Moderate Complexity: 1 Mod  Timiyah Romito OTR/L, MS Acute Rehabilitation Department Office# [phone]   Selinda Flavin 05/15/2022, 10:36 AM

## 2022-05-15 NOTE — NC FL2 (Cosign Needed Addendum)
[place] MEDICAID FL2 LEVEL OF CARE FORM     IDENTIFICATION  Patient Name: Lance Powell Birthdate: January 02, 1930 Sex: male Admission Date (Current Location): 05/14/2022  [facility] and [place] Number:  Producer, television/film/video and Address:  [facility],  [address] [place], [place] [address]      Provider Number: [id number]  Attending Physician Name and Address:  Default, Provider, MD  Relative Name and Phone Number:  Angus Palms (Daughter) [phone]    Current Level of Care: Hospital Recommended Level of Care: Skilled Nursing Facility Prior Approval Number:    Date Approved/Denied:   PASRR Number: [id number]  Discharge Plan: SNF    Current Diagnoses: Patient Active Problem List   Diagnosis Date Noted   Chronic diarrhea 07/11/2021   Dementia 03/14/2021   Accident due to mechanical fall without injury 06/11/2019   Sprain of medial collateral ligament of right knee 04/08/2019   Prepatellar bursitis of right knee 04/08/2019   Chronic respiratory failure 11/06/2017   OA (osteoarthritis) of hip 07/27/2014   Abrasion 07/27/2014   Insomnia 06/27/2014   Chronic cholecystitis 06/16/2014   Coccyx pain 03/30/2014   Diabetes mellitus type II, controlled 03/08/2014   Depression 03/08/2014   GERD (gastroesophageal reflux disease) 03/08/2014   Hyperlipidemia 03/08/2014   Abdominal pain    Diverticulitis of large intestine without perforation or abscess without bleeding    Blood poisoning    Calculus of gallbladder with acute cholecystitis 02/27/2014   Acute cholecystitis    Diverticulitis 02/25/2014   Urinary retention 02/25/2014   Hypertension 02/25/2014   SBO (small bowel obstruction) 02/25/2014   Sepsis 02/25/2014   Cholecystitis 02/25/2014   Slow transit constipation 02/25/2014   Seborrheic dermatitis of scalp 11/24/2013   Diarrhea 07/12/2013   Contusion of multiple sites 05/24/2013   ED (erectile dysfunction) of organic origin 05/06/2013   Seborrheic  keratosis 05/06/2013   Cough 02/17/2013   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   Acute maxillary sinusitis 10/30/2012   Allergic rhinitis 06/27/2012   Hypertrophic and atrophic condition of skin 06/27/2012   Increased frequency of urination 06/27/2012   Lower urinary tract infectious disease 06/27/2012   Major depressive disorder, single episode, unspecified 06/27/2012   Malaise and fatigue 06/27/2012   Psychosexual dysfunction with inhibited sexual excitement 06/27/2012   Personal history of colonic polyps 01/06/2012   Special screening for malignant neoplasms, colon 11/15/2011   Pruritus ani 11/15/2011   COPD (chronic obstructive pulmonary disease) 01/11/2011   DOE (dyspnea on exertion) 01/01/2011   Fissure in ano 12/21/2010   HYPERLIPIDEMIA 01/11/2009   Obstructive sleep apnea 01/11/2009   ARTHRITIS, RHEUMATOID 01/11/2009   OSTEOARTHRITIS 01/11/2009    Orientation RESPIRATION BLADDER Height & Weight     Self  O2 (2L Nasal Cannula) Incontinent Weight: 235 lb (106.6 kg) Height:  6' (182.9 cm)  BEHAVIORAL SYMPTOMS/MOOD NEUROLOGICAL BOWEL NUTRITION STATUS      Incontinent Diet (Regular)  AMBULATORY STATUS COMMUNICATION OF NEEDS Skin   Limited Assist Verbally Normal                       Personal Care Assistance Level of Assistance  Bathing, Feeding, Dressing Bathing Assistance: Limited assistance Feeding assistance: Limited assistance Dressing Assistance: Limited assistance     Functional Limitations Info  Sight, Hearing, Speech Sight Info: Adequate Hearing Info: Adequate Speech Info: Adequate    SPECIAL CARE FACTORS FREQUENCY  PT (By licensed PT), OT (By licensed OT)     PT Frequency: x5/week OT Frequency: x5/week  Contractures Contractures Info: Not present    Additional Factors Info  Code Status, Allergies, Psychotropic Code Status Info: Full Allergies Info: None Psychotropic Info: None         Current Medications (05/15/2022):  This is the  current hospital active medication list Current Facility-Administered Medications  Medication Dose Route Frequency Provider Last Rate Last Admin   albuterol (VENTOLIN HFA) 108 (90 Base) MCG/ACT inhaler 2 puff  2 puff Inhalation Q6H PRN Small, Brooke L, PA       arformoterol (BROVANA) nebulizer solution 15 mcg  15 mcg Nebulization BID Small, Brooke L, PA   15 mcg at 05/14/22 2258   And   umeclidinium bromide (INCRUSE ELLIPTA) 62.5 MCG/ACT 1 puff  1 puff Inhalation Daily Small, Brooke L, PA       aspirin chewable tablet 81 mg  81 mg Oral q morning Small, Brooke L, PA       atorvastatin (LIPITOR) tablet 40 mg  40 mg Oral Daily Small, Brooke L, PA   40 mg at 05/14/22 2212   celecoxib (CELEBREX) capsule 200 mg  200 mg Oral QPM Small, Brooke L, PA   200 mg at 05/14/22 2253   metFORMIN (GLUCOPHAGE) tablet 500 mg  500 mg Oral TID BM Small, Brooke L, PA   500 mg at 05/14/22 2212   misoprostol (CYTOTEC) tablet 100 mcg  100 mcg Oral Q breakfast Small, Brooke L, PA       misoprostol (CYTOTEC) tablet 200 mcg  200 mcg Oral QHS Small, Brooke L, PA   200 mcg at 05/14/22 2253   multivitamin with minerals tablet 1 tablet  1 tablet Oral q morning Small, Brooke L, PA       pioglitazone (ACTOS) tablet 15 mg  15 mg Oral Q1200 Small, Brooke L, PA       polyethylene glycol (MIRALAX / GLYCOLAX) packet 17 g  17 g Oral Daily Small, Brooke L, PA   17 g at 05/14/22 2211   psyllium (HYDROCIL/METAMUCIL) 1 packet  1 packet Oral Q1200 Small, Brooke L, PA       Current Outpatient Medications  Medication Sig Dispense Refill   albuterol (VENTOLIN HFA) 108 (90 Base) MCG/ACT inhaler Inhale 2 puffs into the lungs every 6 (six) hours as needed for wheezing or shortness of breath. 8 g 2   aspirin 81 MG tablet Take 1 tablet (81 mg total) by mouth every morning. Stop for 1 week and then resume (Patient taking differently: Take 81 mg by mouth every morning.)     atorvastatin (LIPITOR) 40 MG tablet TAKE 1 TABLET BY MOUTH EVERY DAY  (Patient taking differently: Take 40 mg by mouth daily.) 90 tablet 3   celecoxib (CELEBREX) 200 MG capsule 1 tab po daily prn pain(advise use sparingly based on age and kidney function) (Patient taking differently: Take 200 mg by mouth at bedtime. (advise use sparingly based on age and kidney function)) 30 capsule 3   Cholecalciferol (VITAMIN D) 50 MCG (2000 UT) tablet Take 2,000 Units by mouth 2 (two) times daily.     metFORMIN (GLUCOPHAGE) 500 MG tablet TAKE 1 TABLET(500 MG) BY MOUTH THREE TIMES DAILY (Patient taking differently: Take 500 mg by mouth See admin instructions. Take 500 mg by mouth in the morning, midday, and evening) 90 tablet 3   misoprostol (CYTOTEC) 100 MCG tablet TAKE 1 TABLET BY MOUTH TWICE DAILY AND 2 TABLETS AT BEDTIME (Patient taking differently: Take 100-200 mcg by mouth See admin instructions. Take 100 mcg by  mouth in the morning and midday. Take 200 mcg by mouth in the evening.) 360 tablet 3   Multiple Vitamins-Minerals (CENTRUM) tablet Take 1 tablet by mouth daily with breakfast.     OXYGEN Inhale 3 L/min into the lungs continuous.     pioglitazone (ACTOS) 15 MG tablet TAKE 1 TABLET(15 MG) BY MOUTH DAILY (Patient taking differently: Take 15 mg by mouth daily with lunch.) 90 tablet 3   PRESCRIPTION MEDICATION Inhale 1 Device into the lungs See admin instructions. CPAP- At bedtime     SYSTANE HYDRATION PF 0.4-0.3 % SOLN Place 1 drop into both eyes 3 (three) times daily.     Tiotropium Bromide-Olodaterol (STIOLTO RESPIMAT) 2.5-2.5 MCG/ACT AERS Inhale 2 puffs into the lungs daily. 4 g 5   venlafaxine XR (EFFEXOR-XR) 37.5 MG 24 hr capsule TAKE 1 CAPSULE(37.5 MG) BY MOUTH DAILY WITH BREAKFAST (Patient taking differently: Take 37.5 mg by mouth daily with breakfast.) 90 capsule 3   vitamin C (ASCORBIC ACID) 500 MG tablet Take 500 mg by mouth every morning.      Wheat Dextrin (BENEFIBER DRINK MIX PO) Take 1 packet by mouth daily as needed (for constipation).     blood glucose meter  kit and supplies Dispense based on patient and insurance preference. Use up to two times daily as directed. (FOR ICD-10 E10.9, E11.9). 1 each 0   colestipol (COLESTID) 1 g tablet Take 2 tablets (2 g total) by mouth 2 (two) times daily. (Patient not taking: Reported on 05/14/2022) 180 tablet 3   CONTOUR NEXT TEST test strip USE AS DIRECTED TO CHECK BLOOD SUGAR TWICE DAILY 100 strip 2   doxycycline (VIBRA-TABS) 100 MG tablet Take 1 tablet (100 mg total) by mouth 2 (two) times daily. (Patient not taking: Reported on 05/14/2022) 20 tablet 0   Spacer/Aero-Holding Chambers DEVI 1 Product by Does not apply route daily. 1 Product 0   Zoster Vaccine Adjuvanted [facility]) injection Inject into the muscle. (Patient not taking: Reported on 05/14/2022) 1 each 1     Discharge Medications: Please see discharge summary for a list of discharge medications.  Relevant Imaging Results:  Relevant Lab Results:   Additional Information SSN: [id number]  Princella Ion, LCSW

## 2022-05-15 NOTE — ED Provider Notes (Addendum)
  Physical Exam  BP 129/82   Pulse 86   Temp 97.6 F (36.4 C) (Oral)   Resp 20   Ht 1.829 m (6')   Wt 106.6 kg   SpO2 94%   BMI 31.87 kg/m   Physical Exam  Procedures  Procedures  ED Course / MDM   Clinical Course as of 05/15/22 1549  Tue May 14, 2022  1452 Stable 89 YOM with a chief complaint of frequent falls at home Has fatigue and dizziness as well as low back pain.  [CC]  Wed May 15, 2022  1418 Leukocytes,Ua: NEGATIVE [DR]    Clinical Course User Index [CC] Glyn Ade, MD [DR] Margarita Grizzle, MD   Medical Decision Making Amount and/or Complexity of Data Reviewed Labs: ordered. Decision-making details documented in ED Course. Radiology: ordered.  Risk Prescription drug management.  89 year old male brought in yesterday due to left back pain and fall last Friday.  Tender.  She has had gradual decline over 2 weeks.  Patient had increased confusion decreased appetite.  Here he was found to have a fecal impaction.  Patient was evaluated with CBC, complete metabolic panel which were both basically normal. Analysis obtained and 21-50 red blood cells bacteria and 6-10 white blood cells. He had imaging of the head and neck with CT scans-no acute intracranial process was seen and no cervical fracture noted, CT lumbar spine showed no evidence of fracture with a stable nonobstructing 11 mm right renal calculus Plain x-Elaisha Zahniser of abdomen showed nonobstructive pattern of bowel gas chest x-Miranda Frese without acute abnormality Recons of abdomen and pelvis are being done to evaluate for ureteral stone. Patient here in ED pending placement.       Margarita Grizzle, MD 05/15/22 3244    Margarita Grizzle, MD 05/15/22 [phone]

## 2022-05-15 NOTE — ED Notes (Signed)
Sarah, PA at bedside performing manual disimpaction. This RN present as chaperone.

## 2022-05-15 NOTE — Evaluation (Signed)
Physical Therapy Evaluation Patient Details Name: Lance Powell MRN: [id number] DOB: [birth year] Today's Date: 05/15/2022  History of Present Illness  Patient is a 89 year old male who presented on 4/23 with weakness and debility with multiple falls in last few days that include hitting head. PMH: dementia, O2 use at home, COPD, DM II.  Clinical Impression  Pt admitted with above diagnosis.  Pt currently with functional limitations due to the deficits listed below (see PT Problem List). Pt will benefit from acute skilled PT to increase their independence and safety with mobility to allow discharge.     The patient   found resting in bed on 3LPM, SPO2 95%.  The patient ambulated x 30' using Rw and min assistance.  Patient required mod assistance for supine<>sit, reporting pain in the left lower /lateral sacral are.a Patient palpates point tenderness at the site.  Patient will benefit from PT while in acute care and after DC.sign      Recommendations for follow up therapy are one component of a multi-disciplinary discharge planning process, led by the attending physician.  Recommendations may be updated based on patient status, additional functional criteria and insurance authorization.  Follow Up Recommendations Can patient physically be transported by private vehicle: Yes     Assistance Recommended at Discharge Intermittent Supervision/Assistance  Patient can return home with the following  A little help with walking and/or transfers;Assistance with cooking/housework;A little help with bathing/dressing/bathroom;Help with stairs or ramp for entrance;Assist for transportation    Equipment Recommendations None recommended by PT  Recommendations for Other Services       Functional Status Assessment Patient has had a recent decline in their functional status and demonstrates the ability to make significant improvements in function in a reasonable and predictable amount of time.      Precautions / Restrictions Precautions Precautions: Fall Restrictions Weight Bearing Restrictions: No      Mobility  Bed Mobility         Supine to sit: Mod assist     General bed mobility comments: with increased time, patient needed assistance to sit upright, reporting Left side pain in left post sacral are, +2 to get back into ED stretcher with physical A for  trunk control and BLE    Transfers Overall transfer level: Needs assistance Equipment used: Rolling walker (2 wheels) Transfers: Sit to/from Stand Sit to Stand: Min assist           General transfer comment: cues for safety    Ambulation/Gait Ambulation/Gait assistance: Min assist Gait Distance (Feet): 30 Feet Assistive device: Rolling walker (2 wheels) Gait Pattern/deviations: Step-to pattern, Step-through pattern Gait velocity: decr     General Gait Details: cues for safety  Stairs            Wheelchair Mobility    Modified Rankin (Stroke Patients Only)       Balance Overall balance assessment: Needs assistance, History of Falls Sitting-balance support: Feet supported, No upper extremity supported Sitting balance-Leahy Scale: Fair     Standing balance support: Reliant on assistive device for balance, During functional activity, Bilateral upper extremity supported Standing balance-Leahy Scale: Poor                               Pertinent Vitals/Pain Pain Assessment Pain Location: L side of back Pain Descriptors / Indicators: Moaning, Discomfort, Guarding Pain Intervention(s): Monitored during session, Limited activity within patient's tolerance    Home Living  Family/patient expects to be discharged to:: Private residence Living Arrangements: Alone Available Help at Discharge: Family;Available PRN/intermittently Type of Home: House Home Access: Stairs to enter   Entrance Stairs-Number of Steps: 1   Home Layout: One level Home Equipment: Rollator (4  wheels);Shower seat Additional Comments: on 2-3 LPM at home    Prior Function Prior Level of Function : Independent/Modified Independent               ADLs Comments: family helps with grocery shopping. meals IADLs, I for ADLs     Hand Dominance   Dominant Hand: Right    Extremity/Trunk Assessment   Upper Extremity Assessment Upper Extremity Assessment: Generalized weakness    Lower Extremity Assessment Lower Extremity Assessment: Generalized weakness    Cervical / Trunk Assessment Cervical / Trunk Assessment: Normal  Communication   Communication: HOH  Cognition Arousal/Alertness: Awake/alert Behavior During Therapy: WFL for tasks assessed/performed                                   General Comments: some confusion with orientation questions,        General Comments      Exercises     Assessment/Plan    PT Assessment Patient needs continued PT services  PT Problem List Decreased strength;Decreased coordination;Decreased cognition;Decreased activity tolerance;Decreased knowledge of use of DME;Decreased balance;Decreased mobility;Pain       PT Treatment Interventions DME instruction;Therapeutic exercise;Gait training;Balance training;Functional mobility training;Therapeutic activities;Patient/family education    PT Goals (Current goals can be found in the Care Plan section)  Acute Rehab PT Goals Patient Stated Goal: go home PT Goal Formulation: With patient Time For Goal Achievement: 05/29/22 Potential to Achieve Goals: Good    Frequency Min 1X/week     Co-evaluation PT/OT/SLP Co-Evaluation/Treatment: Yes Reason for Co-Treatment: To address functional/ADL transfers PT goals addressed during session: Mobility/safety with mobility OT goals addressed during session: ADL's and self-care       AM-PAC PT "6 Clicks" Mobility  Outcome Measure Help needed turning from your back to your side while in a flat bed without using bedrails?: A  Lot Help needed moving from lying on your back to sitting on the side of a flat bed without using bedrails?: A Lot Help needed moving to and from a bed to a chair (including a wheelchair)?: A Lot Help needed standing up from a chair using your arms (e.g., wheelchair or bedside chair)?: A Lot Help needed to walk in hospital room?: A Lot Help needed climbing 3-5 steps with a railing? : Total 6 Click Score: 11    End of Session Equipment Utilized During Treatment: Gait belt Activity Tolerance: Patient tolerated treatment well Patient left: in bed;with call bell/phone within reach Nurse Communication: Mobility status PT Visit Diagnosis: Unsteadiness on feet (R26.81);History of falling (Z91.81);Difficulty in walking, not elsewhere classified (R26.2)    Time: 1610-9604 PT Time Calculation (min) (ACUTE ONLY): 24 min   Charges:   PT Evaluation $PT Eval Low Complexity: 1 Low          [facility]anch[facility] Kelch PT Acute Rehabilitation Services Office [phone] Weekend [phone]   Rada Hay 05/15/2022, 10:41 AM

## 2022-05-15 NOTE — Progress Notes (Signed)
Found PT off CPAP while laying in bed. PT wished to remain off CPAP. Sp02 was 88%- RT placed PT on 3 LPM with current Sp02 94%. RN aware.

## 2022-05-15 NOTE — Progress Notes (Addendum)
  Addend @ 4:56  At this time the facility has not giving a time for pick up or, BED/ REPORT number. TOC will follow up in the AM for DC instructions.   Patient insurance AUTH was approved. This CSW did reach out to Star from [place] [facility] to inform of the patient's approval to come. Start stated that the patient will need to come in the AM. This CSW has asked Star about Bed # report #. TOC will update with that information as it becomes available. TOC will continue to follow patient for DC.

## 2022-05-16 ENCOUNTER — Emergency Department (HOSPITAL_COMMUNITY): Payer: Medicare Other

## 2022-05-16 ENCOUNTER — Other Ambulatory Visit: Payer: Self-pay

## 2022-05-16 ENCOUNTER — Emergency Department (HOSPITAL_COMMUNITY)
Admission: EM | Admit: 2022-05-16 | Discharge: 2022-05-17 | Disposition: A | Discharge status: Skilled Nursing Facility | Payer: Medicare Other | Source: Home / Self Care | Attending: Emergency Medicine | Admitting: Emergency Medicine

## 2022-05-16 ENCOUNTER — Encounter (HOSPITAL_COMMUNITY): Payer: Self-pay | Admitting: *Deleted

## 2022-05-16 DIAGNOSIS — I10 Essential (primary) hypertension: Secondary | ICD-10-CM | POA: Insufficient documentation

## 2022-05-16 DIAGNOSIS — Z7982 Long term (current) use of aspirin: Secondary | ICD-10-CM | POA: Insufficient documentation

## 2022-05-16 DIAGNOSIS — E119 Type 2 diabetes mellitus without complications: Secondary | ICD-10-CM | POA: Insufficient documentation

## 2022-05-16 DIAGNOSIS — Z7951 Long term (current) use of inhaled steroids: Secondary | ICD-10-CM | POA: Insufficient documentation

## 2022-05-16 DIAGNOSIS — Z87891 Personal history of nicotine dependence: Secondary | ICD-10-CM | POA: Insufficient documentation

## 2022-05-16 DIAGNOSIS — Z7984 Long term (current) use of oral hypoglycemic drugs: Secondary | ICD-10-CM | POA: Insufficient documentation

## 2022-05-16 DIAGNOSIS — J441 Chronic obstructive pulmonary disease with (acute) exacerbation: Secondary | ICD-10-CM | POA: Insufficient documentation

## 2022-05-16 DIAGNOSIS — R000 Tachycardia, unspecified: Secondary | ICD-10-CM

## 2022-05-16 DIAGNOSIS — F0390 Unspecified dementia without behavioral disturbance: Secondary | ICD-10-CM | POA: Insufficient documentation

## 2022-05-16 LAB — I-STAT VENOUS BLOOD GAS, ED
Acid-Base Excess: 5 mmol/L — ABNORMAL HIGH (ref 0.0–2.0)
Bicarbonate: 29.7 mmol/L — ABNORMAL HIGH (ref 20.0–28.0)
Calcium, Ion: 1.3 mmol/L (ref 1.15–1.40)
HCT: 43 % (ref 39.0–52.0)
Hemoglobin: 14.6 g/dL (ref 13.0–17.0)
O2 Saturation: 78 %
Potassium: 3.9 mmol/L (ref 3.5–5.1)
Sodium: 139 mmol/L (ref 135–145)
TCO2: 31 mmol/L (ref 22–32)
pCO2, Ven: 44.6 mmHg (ref 44–60)
pH, Ven: 7.431 — ABNORMAL HIGH (ref 7.25–7.43)
pO2, Ven: 42 mmHg (ref 32–45)

## 2022-05-16 LAB — CBC WITH DIFFERENTIAL/PLATELET
Abs Immature Granulocytes: 0.03 10*3/uL (ref 0.00–0.07)
Basophils Absolute: 0.1 10*3/uL (ref 0.0–0.1)
Basophils Relative: 1 %
Eosinophils Absolute: 0.3 10*3/uL (ref 0.0–0.5)
Eosinophils Relative: 3 %
HCT: 44.8 % (ref 39.0–52.0)
Hemoglobin: 14.5 g/dL (ref 13.0–17.0)
Immature Granulocytes: 0 %
Lymphocytes Relative: 31 %
Lymphs Abs: 3.2 10*3/uL (ref 0.7–4.0)
MCH: 31.8 pg (ref 26.0–34.0)
MCHC: 32.4 g/dL (ref 30.0–36.0)
MCV: 98.2 fL (ref 80.0–100.0)
Monocytes Absolute: 1.2 10*3/uL — ABNORMAL HIGH (ref 0.1–1.0)
Monocytes Relative: 11 %
Neutro Abs: 5.7 10*3/uL (ref 1.7–7.7)
Neutrophils Relative %: 54 %
Platelets: 229 10*3/uL (ref 150–400)
RBC: 4.56 MIL/uL (ref 4.22–5.81)
RDW: 13.4 % (ref 11.5–15.5)
WBC: 10.4 10*3/uL (ref 4.0–10.5)
nRBC: 0 % (ref 0.0–0.2)

## 2022-05-16 LAB — COMPREHENSIVE METABOLIC PANEL
ALT: 20 U/L (ref 0–44)
AST: 21 U/L (ref 15–41)
Albumin: 3.5 g/dL (ref 3.5–5.0)
Alkaline Phosphatase: 72 U/L (ref 38–126)
Anion gap: 8 (ref 5–15)
BUN: 16 mg/dL (ref 8–23)
CO2: 28 mmol/L (ref 22–32)
Calcium: 10.1 mg/dL (ref 8.9–10.3)
Chloride: 103 mmol/L (ref 98–111)
Creatinine, Ser: 1.38 mg/dL — ABNORMAL HIGH (ref 0.61–1.24)
GFR, Estimated: 49 mL/min — ABNORMAL LOW (ref >60)
Glucose, Bld: 100 mg/dL — ABNORMAL HIGH (ref 70–99)
Potassium: 4 mmol/L (ref 3.5–5.1)
Sodium: 139 mmol/L (ref 135–145)
Total Bilirubin: 0.7 mg/dL (ref 0.3–1.2)
Total Protein: 6.7 g/dL (ref 6.5–8.1)

## 2022-05-16 LAB — D-DIMER, QUANTITATIVE: D-Dimer, Quant: 0.37 ug/mL-FEU (ref 0.00–0.50)

## 2022-05-16 LAB — TROPONIN I (HIGH SENSITIVITY)
Troponin I (High Sensitivity): 15 ng/L (ref <18)
Troponin I (High Sensitivity): 14 ng/L (ref <18)

## 2022-05-16 LAB — BRAIN NATRIURETIC PEPTIDE: B Natriuretic Peptide: 23.4 pg/mL (ref 0.0–100.0)

## 2022-05-16 MED ORDER — IPRATROPIUM-ALBUTEROL 0.5-2.5 (3) MG/3ML IN SOLN
3.0000 mL | Freq: Once | RESPIRATORY_TRACT | Status: AC
  Administered 2022-05-16: 3 mL via RESPIRATORY_TRACT
  Filled 2022-05-16: qty 3

## 2022-05-16 MED ORDER — PREDNISONE 20 MG PO TABS: 40.0000 mg | ORAL_TABLET | Freq: Every day | ORAL | 0 refills | Status: AC

## 2022-05-16 MED ORDER — SODIUM CHLORIDE 0.9 % IV BOLUS
500.0000 mL | Freq: Once | INTRAVENOUS | Status: AC
  Administered 2022-05-16: 500 mL via INTRAVENOUS

## 2022-05-16 MED ORDER — OXYCODONE-ACETAMINOPHEN 5-325 MG PO TABS
1.0000 | ORAL_TABLET | Freq: Once | ORAL | Status: AC
  Administered 2022-05-16: 1 via ORAL
  Filled 2022-05-16: qty 1

## 2022-05-16 MED ORDER — METHYLPREDNISOLONE SODIUM SUCC 125 MG IJ SOLR
125.0000 mg | Freq: Once | INTRAMUSCULAR | Status: AC
  Administered 2022-05-16: 125 mg via INTRAVENOUS
  Filled 2022-05-16: qty 2

## 2022-05-16 MED ORDER — IOHEXOL 350 MG/ML SOLN
75.0000 mL | Freq: Once | INTRAVENOUS | Status: AC | PRN
  Administered 2022-05-16: 75 mL via INTRAVENOUS

## 2022-05-16 MED ORDER — ALBUTEROL SULFATE HFA 108 (90 BASE) MCG/ACT IN AERS
2.0000 | INHALATION_SPRAY | RESPIRATORY_TRACT | Status: DC | PRN
  Filled 2022-05-16: qty 6.7

## 2022-05-16 NOTE — ED Notes (Signed)
Xray at BS 

## 2022-05-16 NOTE — ED Notes (Signed)
Report called to Facility care giver at [facility]. PTAR called to transfer back all paperwork prepared and ready per policy

## 2022-05-16 NOTE — Discharge Instructions (Signed)
We evaluated you for your low oxygen level.  We checked your oxygen level in the emergency department and it was normal on your normal 2L oxygen.  Your heart rate was elevated but we did not find any dangerous cause of elevated heart rate such as a pneumonia or blood clot.  We think you may have had a minor exacerbation of your COPD so we have prescribed you a 5-day course of steroids.  Please keep an eye on your symptoms at your rehab facility and return to the emergency department if you have any shortness of breath, cough, chest pain, abdominal pain, nausea or vomiting, fevers or chills, or any other concerning symptoms.

## 2022-05-16 NOTE — ED Provider Notes (Signed)
Emergency Medicine Observation Re-evaluation Note  Lance Powell is a 89 y.o. male, seen on rounds today.  Pt initially presented to the ED for complaints of Back Pain and Weakness Currently, the patient is awake and alert.  Physical Exam  BP 108/83   Pulse 76   Temp 97.7 F (36.5 C) (Oral)   Resp 16   Ht 6' (1.829 m)   Wt 106.6 kg   SpO2 96%   BMI 31.87 kg/m  Physical Exam General: awake Cardiac: rr Lungs: cta Psych: calm  ED Course / MDM  EKG:EKG Interpretation  Date/Time:  Tuesday May 14 2022 14:16:31 EDT Ventricular Rate:  95 PR Interval:  229 QRS Duration: 97 QT Interval:  352 QTC Calculation: 443 R Axis:   84 Text Interpretation: Sinus rhythm Prolonged PR interval Borderline right axis deviation Minimal ST depression, inferior leads Confirmed by Drema Pry [phone]) on 05/15/2022 11:34:36 AM  I have reviewed the labs performed to date as well as medications administered while in observation.  Recent changes in the last 24 hours include acceptance at [facility] place.  Plan  Current plan is for d/c to [facility].    Jacalyn Lefevre, MD 05/16/22 [phone]

## 2022-05-16 NOTE — ED Triage Notes (Signed)
BIB [facility] from [facility]. Recent events: Was at home, fell Friday, sent to [facility], discharged today to [facility] rehab, after 1 hour at [facility] SPO2 reading low and sent here to [facility] ED. Mentions continued recurrent L posterior thoracic pain from fall. H/o COPD. Not on home O2. 85% RA. 94% on 5L. Pt alert, NAD, calm, interactive.

## 2022-05-16 NOTE — ED Provider Notes (Signed)
Lance Powell EMERGENCY DEPARTMENT AT [facility] Provider Note  CSN: [id number] Arrival date & time: 05/16/22 1451  Chief Complaint(s) Shortness of Breath  HPI Lance Powell is a 89 y.o. male with history of COPD on 2 L of oxygen, diabetes, hyperlipidemia, hypertension, dementia presenting to the emergency department with hypoxia.  Patient was recently boarding at [facility] emergency department after multiple falls and concerns with ability to care for patient at home.  Patient was ultimately placed at [facility] rehab for inpatient rehabilitation.  He was transferred there today, however on arrival, he was found to be hypoxic to 85%.  The patient reports he feels normal, has chronic unchanged cough, no chest pain, does not really feel any shortness of breath.  No abdominal pain or leg swelling.  He reports some chronic back pain which is unchanged.  No numbness or tingling.  Patient is unsure whether he was on oxygen when oxygen was checked at [facility] rehab.  Apparently EMS reports that he was on room air when 85% oxygen saturation was obtained.   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   COPD (chronic obstructive pulmonary disease) (HCC)    Depression 03/08/2014   Diabetes mellitus    GERD (gastroesophageal reflux disease) 03/08/2014   History of blood transfusion    Hyperlipemia    Hypertension    OSA (obstructive sleep apnea)    Osteoarthritis    Rheumatoid arthritis(714.0)    Sleep apnea    cpap   Patient Active Problem List   Diagnosis Date Noted   Chronic diarrhea 07/11/2021   Dementia (HCC) 03/14/2021   Accident due to mechanical fall without injury 06/11/2019   Sprain of medial collateral ligament of right knee 04/08/2019   Prepatellar bursitis of right knee 04/08/2019   Chronic respiratory failure (HCC) 11/06/2017   OA (osteoarthritis) of hip 07/27/2014   Abrasion 07/27/2014   Insomnia 06/27/2014    Chronic cholecystitis 06/16/2014   Coccyx pain 03/30/2014   Diabetes mellitus type II, controlled (HCC) 03/08/2014   Depression 03/08/2014   GERD (gastroesophageal reflux disease) 03/08/2014   Hyperlipidemia 03/08/2014   Abdominal pain    Diverticulitis of large intestine without perforation or abscess without bleeding    Blood poisoning    Calculus of gallbladder with acute cholecystitis 02/27/2014   Acute cholecystitis    Diverticulitis 02/25/2014   Urinary retention 02/25/2014   Hypertension 02/25/2014   SBO (small bowel obstruction) (HCC) 02/25/2014   Sepsis (HCC) 02/25/2014   Cholecystitis 02/25/2014   Slow transit constipation 02/25/2014   Seborrheic dermatitis of scalp 11/24/2013   Diarrhea 07/12/2013   Contusion of multiple sites 05/24/2013   ED (erectile dysfunction) of organic origin 05/06/2013   Seborrheic keratosis 05/06/2013   Cough 02/17/2013   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   Acute maxillary sinusitis 10/30/2012   Allergic rhinitis 06/27/2012   Hypertrophic and atrophic condition of skin 06/27/2012   Increased frequency of urination 06/27/2012   Lower urinary tract infectious disease 06/27/2012   Major depressive disorder, single episode, unspecified 06/27/2012   Malaise and fatigue 06/27/2012   Psychosexual dysfunction with inhibited sexual excitement 06/27/2012   Personal history of colonic polyps 01/06/2012   Special screening for malignant neoplasms, colon 11/15/2011   Pruritus ani 11/15/2011   COPD (chronic obstructive pulmonary disease) (HCC) 01/11/2011   DOE (dyspnea on exertion) 01/01/2011   Fissure in ano 12/21/2010   HYPERLIPIDEMIA 01/11/2009   Obstructive sleep  apnea 01/11/2009   ARTHRITIS, RHEUMATOID 01/11/2009   OSTEOARTHRITIS 01/11/2009   Home Medication(s) Prior to Admission medications   Medication Sig Start Date End Date Taking? Authorizing Provider  albuterol (VENTOLIN HFA) 108 (90 Base) MCG/ACT inhaler Inhale 2 puffs into the lungs  every 6 (six) hours as needed for wheezing or shortness of breath. 11/29/21  Yes Cobb, Ruby Cola, NP  aspirin 81 MG tablet Take 1 tablet (81 mg total) by mouth every morning. Stop for 1 week and then resume Patient taking differently: Take 81 mg by mouth every morning. 01/08/12  Yes Vassie Loll, MD  atorvastatin (LIPITOR) 40 MG tablet TAKE 1 TABLET BY MOUTH EVERY DAY Patient taking differently: Take 40 mg by mouth daily. 07/17/21  Yes [name], Ramon Dredge, PA-C  celecoxib (CELEBREX) 200 MG capsule 1 tab po daily prn pain(advise use sparingly based on age and kidney function) Patient taking differently: Take 200 mg by mouth at bedtime. 01/30/22  Yes [name], Ramon Dredge, PA-C  Cholecalciferol (VITAMIN D) 50 MCG (2000 UT) tablet Take 2,000 Units by mouth 2 (two) times daily.   Yes [provider]  metFORMIN (GLUCOPHAGE) 500 MG tablet TAKE 1 TABLET(500 MG) BY MOUTH THREE TIMES DAILY Patient taking differently: Take 500 mg by mouth See admin instructions. Take 500 mg by mouth in the morning, midday, and evening 04/05/22  Yes [name], Ramon Dredge, PA-C  misoprostol (CYTOTEC) 100 MCG tablet TAKE 1 TABLET BY MOUTH TWICE DAILY AND 2 TABLETS AT BEDTIME Patient taking differently: Take 100-200 mcg by mouth See admin instructions. Take 100 mcg by mouth in the morning and midday. Take 200 mcg by mouth at bedtime. 05/10/22  Yes [name], Ramon Dredge, PA-C  Multiple Vitamins-Minerals (CENTRUM) tablet Take 1 tablet by mouth daily with breakfast.   Yes [provider]  OXYGEN Inhale 3 L/min into the lungs continuous.   Yes [provider]  pioglitazone (ACTOS) 15 MG tablet TAKE 1 TABLET(15 MG) BY MOUTH DAILY Patient taking differently: Take 15 mg by mouth daily with lunch. 04/23/21  Yes [name], Ramon Dredge, PA-C  predniSONE (DELTASONE) 20 MG tablet Take 2 tablets (40 mg total) by mouth daily for 5 days. 05/16/22 05/21/22 Yes Lonell Grandchild, MD  SYSTANE HYDRATION PF 0.4-0.3 % SOLN Place 1 drop into both eyes 3  (three) times daily.   Yes [provider]  Tiotropium Bromide-Olodaterol (STIOLTO RESPIMAT) 2.5-2.5 MCG/ACT AERS Inhale 2 puffs into the lungs daily. 04/04/21  Yes Cobb, Ruby Cola, NP  venlafaxine XR (EFFEXOR-XR) 37.5 MG 24 hr capsule TAKE 1 CAPSULE(37.5 MG) BY MOUTH DAILY WITH BREAKFAST Patient taking differently: Take 37.5 mg by mouth daily with breakfast. 06/19/21  Yes [name], Ramon Dredge, PA-C  vitamin C (ASCORBIC ACID) 500 MG tablet Take 500 mg by mouth every morning.    Yes [provider]  blood glucose meter kit and supplies Dispense based on patient and insurance preference. Use up to two times daily as directed. (FOR ICD-10 E10.9, E11.9). 12/07/21   [name], Ramon Dredge, PA-C  colestipol (COLESTID) 1 g tablet Take 2 tablets (2 g total) by mouth 2 (two) times daily. Patient not taking: Reported on 05/14/2022 08/13/21   Unk Lightning, [place]  CONTOUR NEXT TEST test strip USE AS DIRECTED TO CHECK BLOOD SUGAR TWICE DAILY 04/13/21   [name], Ramon Dredge, PA-C  doxycycline (VIBRA-TABS) 100 MG tablet Take 1 tablet (100 mg total) by mouth 2 (two) times daily. Patient not taking: Reported on 05/14/2022 12/01/21   [name], Ramon Dredge, PA-C  PRESCRIPTION MEDICATION Inhale 1 Device into the  lungs See admin instructions. CPAP- At bedtime    [provider]  Spacer/Aero-Holding Chambers DEVI 1 Product by Does not apply route daily. 11/02/20   Leslye Peer, MD                                                                                                                                    Past Surgical History Past Surgical History:  Procedure Laterality Date   APPENDECTOMY  1969   CHOLECYSTECTOMY N/A 06/16/2014   Procedure: LAPAROSCOPIC CHOLECYSTECTOMY;  Surgeon: Gaynelle Adu, MD;  Location: [facility];  Service: General;  Laterality: N/A;   CYSTOSCOPY  01/23/2011   Procedure: CYSTOSCOPY;  Surgeon: Milford Cage, MD;  Location: [facility];  Service: Urology;   Laterality: N/A;   FLEXIBLE SIGMOIDOSCOPY  01/07/2012   Procedure: FLEXIBLE SIGMOIDOSCOPY;  Surgeon: Meryl Dare, MD,FACG;  Location: [facility];  Service: Endoscopy;  Laterality: N/A;   HERNIA REPAIR  1990   right and left inguinal   NISSEN FUNDOPLICATION     SIGMOIDECTOMY  2004   colovesical fistula   TONSILLECTOMY  1937   TRANSURETHRAL RESECTION OF PROSTATE  01/23/2011   Procedure: TRANSURETHRAL RESECTION OF THE PROSTATE WITH GYRUS INSTRUMENTS;  Surgeon: Milford Cage, MD;  Location: [facility];  Service: Urology;  Laterality: N/A;   VASECTOMY  1972   Family History Family History  Problem Relation Age of Onset   Heart disease Mother    Diabetes Mother    Emphysema Brother    Breast cancer Daughter    Cancer Daughter        breast   Stomach cancer Neg Hx    Esophageal cancer Neg Hx    Colon cancer Neg Hx    Liver cancer Neg Hx    Pancreatic cancer Neg Hx    Rectal cancer Neg Hx     Social History Social History   Tobacco Use   Smoking status: Former    Packs/day: 4.00    Years: 30.00    Additional pack years: 0.00    Total pack years: 120.00    Types: Cigarettes    Quit date: 01/21/1978    Years since quitting: 44.3   Smokeless tobacco: Never  Vaping Use   Vaping Use: Never used  Substance Use Topics   Alcohol use: No   Drug use: No   Allergies Patient has no known allergies.  Review of Systems Review of Systems  All other systems reviewed and are negative.   Physical Exam Vital Signs  I have reviewed the triage vital signs BP 130/84   Pulse (!) 103   Temp 98.7 F (37.1 C) (Oral)   Resp (!) 22   Wt 106.6 kg   SpO2 91%   BMI 31.87 kg/m  Physical Exam Vitals and nursing note reviewed.  Constitutional:      General: He is not in acute distress.  Appearance: Normal appearance.  HENT:     Mouth/Throat:     Mouth: Mucous membranes are moist.  Eyes:     Conjunctiva/sclera: Conjunctivae normal.  Cardiovascular:      Rate and Rhythm: Normal rate and regular rhythm.  Pulmonary:     Effort: Pulmonary effort is normal. No respiratory distress.     Breath sounds: Wheezing (mild, diffuse) present.  Abdominal:     General: Abdomen is flat.     Palpations: Abdomen is soft.     Tenderness: There is no abdominal tenderness.  Musculoskeletal:     Right lower leg: No edema.     Left lower leg: No edema.  Skin:    General: Skin is warm and dry.     Capillary Refill: Capillary refill takes less than 2 seconds.  Neurological:     Mental Status: He is alert and oriented to person, place, and time. Mental status is at baseline.  Psychiatric:        Mood and Affect: Mood normal.        Behavior: Behavior normal.     ED Results and Treatments Labs (all labs ordered are listed, but only abnormal results are displayed) Labs Reviewed  CBC WITH DIFFERENTIAL/PLATELET - Abnormal; Notable for the following components:      Result Value   Monocytes Absolute 1.2 (*)    All other components within normal limits  COMPREHENSIVE METABOLIC PANEL - Abnormal; Notable for the following components:   Glucose, Bld 100 (*)    Creatinine, Ser 1.38 (*)    GFR, Estimated 49 (*)    All other components within normal limits  I-STAT VENOUS BLOOD GAS, ED - Abnormal; Notable for the following components:   pH, Ven 7.431 (*)    Bicarbonate 29.7 (*)    Acid-Base Excess 5.0 (*)    All other components within normal limits  BRAIN NATRIURETIC PEPTIDE  D-DIMER, QUANTITATIVE  TROPONIN I (HIGH SENSITIVITY)  TROPONIN I (HIGH SENSITIVITY)                                                                                                                          Radiology CT Angio Chest PE W and/or Wo Contrast  Result Date: 05/16/2022 CLINICAL DATA:  Short of breath, negative D-dimer EXAM: CT ANGIOGRAPHY CHEST WITH CONTRAST TECHNIQUE: Multidetector CT imaging of the chest was performed using the standard protocol during bolus administration  of intravenous contrast. Multiplanar CT image reconstructions and MIPs were obtained to evaluate the vascular anatomy. RADIATION DOSE REDUCTION: This exam was performed according to the departmental dose-optimization program which includes automated exposure control, adjustment of the mA and/or kV according to patient size and/or use of iterative reconstruction technique. CONTRAST:  75mL OMNIPAQUE IOHEXOL 350 MG/ML SOLN COMPARISON:  05/16/2022, 12/06/2021 FINDINGS: Cardiovascular: This is a technically adequate evaluation of the pulmonary vasculature. No filling defects or pulmonary emboli. The heart is unremarkable without pericardial effusion. No evidence of thoracic aortic aneurysm or dissection. Calcifications  of the aortic valve, as well as atherosclerosis of the aorta and coronary vasculature. Mediastinum/Nodes: No enlarged mediastinal, hilar, or axillary lymph nodes. Thyroid gland, trachea, and esophagus demonstrate no significant findings. Lungs/Pleura: Emphysema, with bibasilar areas of scarring or atelectasis. No acute airspace disease, effusion, or pneumothorax. Central airways are patent. There are multiple stable sub 5 mm pulmonary nodules. Nodules are as follows: Left upper lobe, image 50/6, 4 mm. Left upper lobe, image 75/6, 4 mm. Right upper lobe, image 62/6, 3 mm. Upper Abdomen: Nonobstructing right renal calculus measures 11 mm. No other acute findings. Musculoskeletal: No acute or destructive bony lesions. Stable partially calcified mass within the right eleventh intercostal space, consistent with benign etiology given long-term stability. Reconstructed images demonstrate no additional findings. Review of the MIP images confirms the above findings. IMPRESSION: 1. No evidence of pulmonary embolus. 2. No acute intrathoracic process. 3. Aortic Atherosclerosis (ICD10-I70.0) and Emphysema (ICD10-J43.9). 4. Multiple pulmonary nodules. Most significant: Left solid pulmonary nodule within the upper lobe  measuring 4 mm. Per Fleischner Society Guidelines, a non-contrast Chest CT at 6 months is optional to document 1 year stability. If performed and the nodule is stable at 12 months, no further follow-up is recommended. These guidelines do not apply to immunocompromised patients and patients with cancer. Follow up in patients with significant comorbidities as clinically warranted. For lung cancer screening, adhere to Lung-RADS guidelines. Reference: Radiology. 2017; 284(1):228-43. 5. Nonobstructing right renal calculus. Electronically Signed   By: Sharlet Salina M.D.   On: 05/16/2022 22:34   DG Chest Port 1 View  Result Date: 05/16/2022 CLINICAL DATA:  Shortness of breath EXAM: PORTABLE CHEST 1 VIEW COMPARISON:  X-ray 05/14/2022 FINDINGS: Hyperinflation. Slight basilar atelectasis. No consolidation, pneumothorax, effusion or edema. Chronic lung changes. Calcified aorta. Normal cardiopericardial silhouette. IMPRESSION: Hyperinflation with chronic lung changes. Slight basilar atelectasis Electronically Signed   By: Karen Kays M.D.   On: 05/16/2022 15:28    Pertinent labs & imaging results that were available during my care of the patient were reviewed by me and considered in my medical decision making (see MDM for details).  Medications Ordered in ED Medications  ipratropium-albuterol (DUONEB) 0.5-2.5 (3) MG/3ML nebulizer solution 3 mL (3 mLs Nebulization Given 05/16/22 1541)  methylPREDNISolone sodium succinate (SOLU-MEDROL) 125 mg/2 mL injection 125 mg (125 mg Intravenous Given 05/16/22 1542)  sodium chloride 0.9 % bolus 500 mL (0 mLs Intravenous Stopped 05/16/22 1823)  ipratropium-albuterol (DUONEB) 0.5-2.5 (3) MG/3ML nebulizer solution 3 mL (3 mLs Nebulization Given 05/16/22 1827)  sodium chloride 0.9 % bolus 500 mL (0 mLs Intravenous Stopped 05/16/22 1842)  oxyCODONE-acetaminophen (PERCOCET/ROXICET) 5-325 MG per tablet 1 tablet (1 tablet Oral Given 05/16/22 1845)  sodium chloride 0.9 % bolus 500 mL (0  mLs Intravenous Stopped 05/16/22 2123)  oxyCODONE-acetaminophen (PERCOCET/ROXICET) 5-325 MG per tablet 1 tablet (1 tablet Oral Given 05/16/22 2057)  iohexol (OMNIPAQUE) 350 MG/ML injection 75 mL (75 mLs Intravenous Contrast Given 05/16/22 2212)  Procedures Procedures  (including critical care time)  Medical Decision Making / ED Course   MDM:  89 year old presenting to the emergency department with low oxygen level.  Patient feels fine.  Exam he does have diffuse mild wheezing.  He denies any other symptoms such as chest pain or shortness of breath.  He does seem more hypoxic than his baseline.  He is typically on 2 L of oxygen here he is on 4-5 and borderline hypoxic.  Vitals notable for mild tachycardia.  Differential includes COPD exacerbation, chest x-ray without sign of pneumonia and patient denies any fevers or cough.  Less likely pulmonary embolism the patient was boarding in the emergency department probably immobile so we will check D-dimer given tachycardia.  No clinical signs of CHF exacerbation, no peripheral edema.  Will reassess.  If symptoms improved with breathing treatments and stable at home oxygen likely can discharge back to rehab facility.  Clinical Course as of 05/16/22 2347  Thu May 16, 2022  1732 Reposition pulse oximeter.  Now reading 98% on 2 L with good waveform.  Patient continues to be asymptomatic.  His heart rate is still mildly elevated but his D-dimer was negative.  Doubt PE.  Doubt occult infection.  Will give some additional fluids as he does appear to dehydrated.  Will monitor.  Anticipate likely discharge back to rehab facility once heart rate is improved. [WS]  1850 Heart rate is still mildly elevated. [WS]  2335 Heart rate improved.  Even though D-dimer was negative, ultimately obtain CT angiography of the chest given persistent  tachycardia..  This was negative for pneumonia, did have some lung nodules which I discussed with daughter including need for repeat CT scan in 6 months.  Patient continues to say that he feels at baseline.  Will discharge back to [facility] rehab. Will discharge patient to home. All questions answered. Patient comfortable with plan of discharge. Return precautions discussed with patient and specified on the after visit summary. [WS]    Clinical Course User Index [WS] Lonell Grandchild, MD     Additional history obtained: -Additional history obtained from family and ems -External records from outside source obtained and reviewed including: Chart review including previous notes, labs, imaging, consultation notes including recent ED boarding for weakness   Lab Tests: -I ordered, reviewed, and interpreted labs.   The pertinent results include:   Labs Reviewed  CBC WITH DIFFERENTIAL/PLATELET - Abnormal; Notable for the following components:      Result Value   Monocytes Absolute 1.2 (*)    All other components within normal limits  COMPREHENSIVE METABOLIC PANEL - Abnormal; Notable for the following components:   Glucose, Bld 100 (*)    Creatinine, Ser 1.38 (*)    GFR, Estimated 49 (*)    All other components within normal limits  I-STAT VENOUS BLOOD GAS, ED - Abnormal; Notable for the following components:   pH, Ven 7.431 (*)    Bicarbonate 29.7 (*)    Acid-Base Excess 5.0 (*)    All other components within normal limits  BRAIN NATRIURETIC PEPTIDE  D-DIMER, QUANTITATIVE  TROPONIN I (HIGH SENSITIVITY)  TROPONIN I (HIGH SENSITIVITY)    Notable for negative troponin x2. Reassuring pco2. Borderline AKI  EKG   EKG Interpretation  Date/Time:  Thursday May 16 2022 20:11:37 EDT Ventricular Rate:  123 PR Interval:  148 QRS Duration: 84 QT Interval:  363 QTC Calculation: 520 R Axis:   90 Text Interpretation: Sinus tachycardia with prolonged PR interval Atrial  premature complexes  Borderline right axis deviation Repolarization abnormality, prob rate related Prolonged QT interval Confirmed by Alvino Blood ([id number]) on 05/16/2022 9:08:22 PM         Imaging Studies ordered: I ordered imaging studies including CTA chest On my interpretation imaging demonstrates lung nodules, no PE or PNA I independently visualized and interpreted imaging. I agree with the radiologist interpretation   Medicines ordered and prescription drug management: Meds ordered this encounter  Medications   DISCONTD: albuterol (VENTOLIN HFA) 108 (90 Base) MCG/ACT inhaler 2 puff   ipratropium-albuterol (DUONEB) 0.5-2.5 (3) MG/3ML nebulizer solution 3 mL   methylPREDNISolone sodium succinate (SOLU-MEDROL) 125 mg/2 mL injection 125 mg    IV methylprednisolone will be converted to either a q12h or q24h frequency with the same total daily dose (TDD).  Ordered Dose: 1 to 125 mg TDD; convert to: TDD q24h.  Ordered Dose: 126 to 250 mg TDD; convert to: TDD div q12h.  Ordered Dose: >250 mg TDD; DAW.   sodium chloride 0.9 % bolus 500 mL   ipratropium-albuterol (DUONEB) 0.5-2.5 (3) MG/3ML nebulizer solution 3 mL   sodium chloride 0.9 % bolus 500 mL   oxyCODONE-acetaminophen (PERCOCET/ROXICET) 5-325 MG per tablet 1 tablet   sodium chloride 0.9 % bolus 500 mL   oxyCODONE-acetaminophen (PERCOCET/ROXICET) 5-325 MG per tablet 1 tablet   iohexol (OMNIPAQUE) 350 MG/ML injection 75 mL   predniSONE (DELTASONE) 20 MG tablet    Sig: Take 2 tablets (40 mg total) by mouth daily for 5 days.    Dispense:  10 tablet    Refill:  0    -I have reviewed the patients home medicines and have made adjustments as needed   Cardiac Monitoring: The patient was maintained on a cardiac monitor.  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    Final Clinical Impression(s) / ED Diagnoses Final diagnoses:  Tachycardia  COPD exacerbation (HCC)     This chart was dictated using voice recognition software.  Despite best efforts to proofread,  errors can occur which can change the documentation meaning.    Lonell Grandchild, MD 05/16/22 [phone]

## 2022-05-16 NOTE — ED Notes (Signed)
Assumed care of pt who was discharged from [facility] to rehab this morning after fall . Pt was found by staff to be hypoxic one hour after arrival and was sent by ems here for further work up. Pt has copd and was given several neb tx prior to my arrival and MD is waiting for Hr to lower under 100 before discharged back to care facility. Respirations currently even and non labored lungs cta no further wheezing or distress noted family supportive at bedside

## 2022-05-16 NOTE — ED Notes (Addendum)
Pt. Temperature deferred, pt. Is sleeping per RN.

## 2022-05-16 NOTE — Progress Notes (Addendum)
Notified EDP and RN of pt d/c to [place]. Pt can d/c to [place] in the afternoon. [facility] to transport at 12. Daughter, [name], notified.

## 2022-05-16 NOTE — ED Notes (Signed)
Report given to [facility], Charity fundraiser. [facility] called by Ricka Burdock.

## 2022-05-16 NOTE — ED Notes (Signed)
EDP at [facility]. Family and xray arrive to [facility].

## 2022-05-16 NOTE — ED Notes (Signed)
Patient provided breakfast tray

## 2022-05-17 NOTE — ED Notes (Signed)
Report and paperwork given to [facility] for transfer to [facility]

## 2022-05-19 LAB — CULTURE, BLOOD (ROUTINE X 2)
Culture: NO GROWTH
Culture: NO GROWTH
Special Requests: ADEQUATE

## 2022-06-04 ENCOUNTER — Other Ambulatory Visit: Payer: Self-pay | Admitting: Family Medicine

## 2022-06-04 NOTE — Telephone Encounter (Signed)
Rx refill effexor sent to pharmacy.

## 2022-06-05 ENCOUNTER — Telehealth: Payer: Self-pay

## 2022-06-05 ENCOUNTER — Telehealth: Payer: Self-pay | Admitting: Emergency Medicine

## 2022-06-05 DIAGNOSIS — G4733 Obstructive sleep apnea (adult) (pediatric): Secondary | ICD-10-CM

## 2022-06-05 NOTE — Telephone Encounter (Signed)
error 

## 2022-06-05 NOTE — Telephone Encounter (Signed)
[employer] Evora (Large) and [employer] Simplus (Large) at a pressure setting of 12cm H20.   Spoke with [employer] verbally. She was the last to see the patient back in November 2023 for his OSA. She is ok with [place] ordering the mask under her name.   Called and spoke with patient's daughter [name]. She is aware of this. I will send her a MyChart message to [facility] office here in [place] as requested by [place].   Nothing further needed at time of call.

## 2022-06-05 NOTE — Telephone Encounter (Signed)
Pt's daughter has come into the office.  Pt has had to move to a facility & lost his cpap face mask.  Requesting an ASAP order be sent to [facility] for new facemask.  Pt was given face mask af mask fitting with [facility] [place].  Needs new mask.    Pt's daughter [name], would like to pick up from [facility] today.  [name] can be reached at [phone].

## 2022-07-04 ENCOUNTER — Other Ambulatory Visit: Payer: Self-pay | Admitting: Medical

## 2022-09-27 ENCOUNTER — Emergency Department (HOSPITAL_COMMUNITY): Payer: Medicare Other

## 2022-09-27 ENCOUNTER — Emergency Department (HOSPITAL_COMMUNITY)
Admission: EM | Admit: 2022-09-27 | Discharge: 2022-09-28 | Disposition: A | Discharge status: Home / Self Care | Payer: Medicare Other | Attending: Emergency Medicine | Admitting: Emergency Medicine

## 2022-09-27 ENCOUNTER — Other Ambulatory Visit: Payer: Self-pay

## 2022-09-27 DIAGNOSIS — M7989 Other specified soft tissue disorders: Secondary | ICD-10-CM | POA: Insufficient documentation

## 2022-09-27 DIAGNOSIS — W19XXXA Unspecified fall, initial encounter: Secondary | ICD-10-CM | POA: Insufficient documentation

## 2022-09-27 DIAGNOSIS — S4992XA Unspecified injury of left shoulder and upper arm, initial encounter: Secondary | ICD-10-CM | POA: Diagnosis present

## 2022-09-27 DIAGNOSIS — S42032A Displaced fracture of lateral end of left clavicle, initial encounter for closed fracture: Secondary | ICD-10-CM

## 2022-09-27 DIAGNOSIS — Z7982 Long term (current) use of aspirin: Secondary | ICD-10-CM | POA: Insufficient documentation

## 2022-09-27 MED ORDER — OXYCODONE HCL 5 MG PO TABS
5.0000 mg | ORAL_TABLET | Freq: Four times a day (QID) | ORAL | 0 refills | Status: DC | PRN
Start: 2022-09-27 — End: 2022-10-04

## 2022-09-27 NOTE — Discharge Instructions (Addendum)
Painfully the only broken bone that you have appears to be your collarbone.  Wear sling as tolerated.  This will heal by itself but I would recommend that you follow-up with your family doctor to make sure that things are improving and getting better.  They will need to repeat the x-rays within 1 to 2 weeks.  You may take Tylenol or ibuprofen for pain, for severe pain take oxycodone 1 tablet every 6 hours as needed only for severe pain, this may cause nausea or constipation.  This is not usually a fracture that requires rehabilitation.  If rehabilitation is needed, it is occupational therapy.  Follow-up with your primary doctor or orthopedics if this is felt needed.  ER for severe worsening symptoms

## 2022-09-27 NOTE — ED Notes (Signed)
[facility] concerned about patient needing rehab. MD Horton placed order for ortho referral and home health referral.

## 2022-09-27 NOTE — ED Provider Notes (Signed)
[place] EMERGENCY DEPARTMENT AT [facility] Provider Note   CSN: [id number] Arrival date & time: 09/27/22  2114     History {Add pertinent medical, surgical, social history, OB history to HPI:1} Chief Complaint  Patient presents with   Lance Powell is a 89 y.o. male.   Fall   This patient is a 89 year old male, DO NOT RESUSCITATE, coming from the nursing facility where he had an accidental fall when he tried to get up out of his recliner chair and get into his wheelchair.  He lost his balance falling and striking his left shoulder into the ground, this caused acute onset of pain and swelling.  He also states that he bumped his head on a side table but has no headache, no neck pain.  He does take a baby aspirin but no other anticoagulants    Home Medications Prior to Admission medications   Medication Sig Start Date End Date Taking? Authorizing Provider  albuterol (VENTOLIN HFA) 108 (90 Base) MCG/ACT inhaler Inhale 2 puffs into the lungs every 6 (six) hours as needed for wheezing or shortness of breath. 11/29/21   Cobb, Ruby Cola, NP  aspirin 81 MG tablet Take 1 tablet (81 mg total) by mouth every morning. Stop for 1 week and then resume Patient taking differently: Take 81 mg by mouth every morning. 01/08/12   Vassie Loll, MD  atorvastatin (LIPITOR) 40 MG tablet TAKE 1 TABLET BY MOUTH EVERY DAY Patient taking differently: Take 40 mg by mouth daily. 07/17/21   [name], Ramon Dredge, PA-C  blood glucose meter kit and supplies Dispense based on patient and insurance preference. Use up to two times daily as directed. (FOR ICD-10 E10.9, E11.9). 12/07/21   [name], Ramon Dredge, PA-C  celecoxib (CELEBREX) 200 MG capsule 1 tab po daily prn pain(advise use sparingly based on age and kidney function) Patient taking differently: Take 200 mg by mouth at bedtime. 01/30/22   [name], Ramon Dredge, PA-C  Cholecalciferol (VITAMIN D) 50 MCG (2000 UT) tablet Take 2,000 Units by mouth 2  (two) times daily.    [provider]  colestipol (COLESTID) 1 g tablet Take 2 tablets (2 g total) by mouth 2 (two) times daily. Patient not taking: Reported on 05/14/2022 08/13/21   Unk Lightning, [place]  CONTOUR NEXT TEST test strip USE AS DIRECTED TO CHECK BLOOD SUGAR TWICE DAILY 04/13/21   [name], Ramon Dredge, PA-C  doxycycline (VIBRA-TABS) 100 MG tablet Take 1 tablet (100 mg total) by mouth 2 (two) times daily. Patient not taking: Reported on 05/14/2022 12/01/21   [name], Ramon Dredge, PA-C  metFORMIN (GLUCOPHAGE) 500 MG tablet TAKE 1 TABLET(500 MG) BY MOUTH THREE TIMES DAILY 07/04/22   [name], Ramon Dredge, PA-C  misoprostol (CYTOTEC) 100 MCG tablet TAKE 1 TABLET BY MOUTH TWICE DAILY AND 2 TABLETS AT BEDTIME Patient taking differently: Take 100-200 mcg by mouth See admin instructions. Take 100 mcg by mouth in the morning and midday. Take 200 mcg by mouth at bedtime. 05/10/22   [name], Ramon Dredge, PA-C  Multiple Vitamins-Minerals (CENTRUM) tablet Take 1 tablet by mouth daily with breakfast.    [provider]  OXYGEN Inhale 3 L/min into the lungs continuous.    [provider]  pioglitazone (ACTOS) 15 MG tablet TAKE 1 TABLET(15 MG) BY MOUTH DAILY Patient taking differently: Take 15 mg by mouth daily with lunch. 04/23/21   [name], Ramon Dredge, PA-C  PRESCRIPTION MEDICATION Inhale 1 Device into the lungs See admin instructions. CPAP- At bedtime  [provider]  Spacer/Aero-Holding Chambers DEVI 1 Product by Does not apply route daily. 11/02/20   Byrum, Les Pou, MD  SYSTANE HYDRATION PF 0.4-0.3 % SOLN Place 1 drop into both eyes 3 (three) times daily.    [provider]  Tiotropium Bromide-Olodaterol (STIOLTO RESPIMAT) 2.5-2.5 MCG/ACT AERS Inhale 2 puffs into the lungs daily. 04/04/21   Cobb, Ruby Cola, NP  venlafaxine XR (EFFEXOR-XR) 37.5 MG 24 hr capsule TAKE 1 CAPSULE(37.5 MG) BY MOUTH DAILY WITH BREAKFAST 06/04/22   [name], Ramon Dredge, PA-C  vitamin C (ASCORBIC ACID) 500  MG tablet Take 500 mg by mouth every morning.     [provider]      Allergies    Patient has no known allergies.    Review of Systems   Review of Systems  All other systems reviewed and are negative.   Physical Exam Updated Vital Signs BP (!) 147/90 (BP Location: Right Arm)   Pulse (!) 50   Temp 97.6 F (36.4 C) (Oral)   SpO2 90%  Physical Exam Vitals and nursing note reviewed.  Constitutional:      General: He is not in acute distress.    Appearance: He is well-developed.  HENT:     Head: Normocephalic and atraumatic.     Mouth/Throat:     Pharynx: No oropharyngeal exudate.  Eyes:     General: No scleral icterus.       Right eye: No discharge.        Left eye: No discharge.     Conjunctiva/sclera: Conjunctivae normal.     Pupils: Pupils are equal, round, and reactive to light.  Neck:     Thyroid: No thyromegaly.     Vascular: No JVD.  Cardiovascular:     Rate and Rhythm: Normal rate and regular rhythm.     Heart sounds: Normal heart sounds. No murmur heard.    No friction rub. No gallop.  Pulmonary:     Effort: Pulmonary effort is normal. No respiratory distress.     Breath sounds: Normal breath sounds. No wheezing or rales.  Abdominal:     General: Bowel sounds are normal. There is no distension.     Palpations: Abdomen is soft. There is no mass.     Tenderness: There is no abdominal tenderness.     Comments: Soft nontender and reducible hernia of the abdomen  Musculoskeletal:        General: Swelling, tenderness and deformity present.     Cervical back: Normal range of motion and neck supple.     Comments: Bilateral lower extremities without deformity or edema, bilateral upper extremities examined, the right upper extremity is normal, the left upper extremity has deformity around the shoulder and distal clavicle.  Decreased range of motion secondary to pain but totally normal pulses at the bilateral radial arteries.  Normal range of motion of the left  elbow hand and wrist.  Lymphadenopathy:     Cervical: No cervical adenopathy.  Skin:    General: Skin is warm and dry.     Findings: No erythema or rash.  Neurological:     Mental Status: He is alert.     Coordination: Coordination normal.     Comments: Normal strength and sensation of the left upper extremity limited only by pain in the shoulder  Psychiatric:        Behavior: Behavior normal.     ED Results / Procedures / Treatments   Labs (all labs ordered are listed, but  only abnormal results are displayed) Labs Reviewed - No data to display  EKG None  Radiology No results found.  Procedures Procedures  {Document cardiac monitor, telemetry assessment procedure when appropriate:1}  Medications Ordered in ED Medications - No data to display  ED Course/ Medical Decision Making/ A&P   {   Click here for ABCD2, HEART and other calculatorsREFRESH Note before signing :1}                              Medical Decision Making Amount and/or Complexity of Data Reviewed Radiology: ordered.    This patient presents to the ED for concern of left shoulder injury differential diagnosis includes dislocation, fracture, clavicle injury    Additional history obtained:  Additional history obtained from paramedics External records from outside source obtained and reviewed including deformity report of how he fell   Lab Tests:  I Ordered, and personally interpreted labs.  The pertinent results include: Not indicated   Imaging Studies ordered:  I ordered imaging studies including show clavicle injuries x-rays I independently visualized and interpreted imaging which showed *** I agree with the radiologist interpretation   Medicines ordered and prescription drug management:  I ordered medication including ***  for ***  Reevaluation of the patient after these medicines showed that the patient {resolved/improved/worsened:23923::"improved"} I have reviewed the patients home  medicines and have made adjustments as needed   Problem List / ED Course:  ***   Social Determinants of Health:       {Document critical care time when appropriate:1} {Document review of labs and clinical decision tools ie heart score, Chads2Vasc2 etc:1}  {Document your independent review of radiology images, and any outside records:1} {Document your discussion with family members, caretakers, and with consultants:1} {Document social determinants of health affecting pt's care:1} {Document your decision making why or why not admission, treatments were needed:1} Final Clinical Impression(s) / ED Diagnoses Final diagnoses:  None    Rx / DC Orders ED Discharge Orders     None

## 2022-09-27 NOTE — ED Triage Notes (Signed)
Pt bib [facility] from [facility] living [facility] pt had mechanical fall unwitnessed, fell and hit shoulder and head on dresser. No Loc. C/O left shoulder pain, with deformity. fentanyl with ems.  Bp 140/80 Hr 90  Cbg 140

## 2022-09-27 NOTE — Progress Notes (Signed)
Orthopedic Tech Progress Note Patient Details:  Lance Powell [birth year] [id number]  Ortho Devices Type of Ortho Device: Sling immobilizer Ortho Device/Splint Location: LUE Ortho Device/Splint Interventions: Ordered, Application, Adjustment   Post Interventions Patient Tolerated: Well Instructions Provided: Adjustment of device, Care of device, Poper ambulation with device  [name] 09/27/2022, 11:04 PM

## 2022-10-01 ENCOUNTER — Inpatient Hospital Stay (HOSPITAL_COMMUNITY)
Admission: EM | Admit: 2022-10-01 | Discharge: 2022-10-04 | DRG: 871 | Disposition: A | Discharge status: Hospice | Payer: Medicare Other | Attending: Internal Medicine | Admitting: Internal Medicine

## 2022-10-01 ENCOUNTER — Other Ambulatory Visit (HOSPITAL_COMMUNITY): Payer: Self-pay

## 2022-10-01 ENCOUNTER — Other Ambulatory Visit: Payer: Self-pay

## 2022-10-01 ENCOUNTER — Emergency Department (HOSPITAL_COMMUNITY): Payer: Medicare Other

## 2022-10-01 DIAGNOSIS — I10 Essential (primary) hypertension: Secondary | ICD-10-CM | POA: Diagnosis present

## 2022-10-01 DIAGNOSIS — E119 Type 2 diabetes mellitus without complications: Secondary | ICD-10-CM

## 2022-10-01 DIAGNOSIS — J9621 Acute and chronic respiratory failure with hypoxia: Secondary | ICD-10-CM | POA: Diagnosis present

## 2022-10-01 DIAGNOSIS — N179 Acute kidney failure, unspecified: Secondary | ICD-10-CM | POA: Diagnosis present

## 2022-10-01 DIAGNOSIS — G9341 Metabolic encephalopathy: Secondary | ICD-10-CM | POA: Diagnosis present

## 2022-10-01 DIAGNOSIS — R0600 Dyspnea, unspecified: Secondary | ICD-10-CM | POA: Diagnosis not present

## 2022-10-01 DIAGNOSIS — A419 Sepsis, unspecified organism: Principal | ICD-10-CM | POA: Diagnosis present

## 2022-10-01 DIAGNOSIS — Z8616 Personal history of COVID-19: Secondary | ICD-10-CM | POA: Diagnosis present

## 2022-10-01 DIAGNOSIS — K449 Diaphragmatic hernia without obstruction or gangrene: Secondary | ICD-10-CM | POA: Diagnosis present

## 2022-10-01 DIAGNOSIS — E785 Hyperlipidemia, unspecified: Secondary | ICD-10-CM | POA: Diagnosis present

## 2022-10-01 DIAGNOSIS — J440 Chronic obstructive pulmonary disease with acute lower respiratory infection: Secondary | ICD-10-CM | POA: Diagnosis present

## 2022-10-01 DIAGNOSIS — T17990A Other foreign object in respiratory tract, part unspecified in causing asphyxiation, initial encounter: Secondary | ICD-10-CM | POA: Diagnosis present

## 2022-10-01 DIAGNOSIS — R112 Nausea with vomiting, unspecified: Secondary | ICD-10-CM | POA: Diagnosis present

## 2022-10-01 DIAGNOSIS — W44F9XA Other object of natural or organic material, entering into or through a natural orifice, initial encounter: Secondary | ICD-10-CM | POA: Diagnosis present

## 2022-10-01 DIAGNOSIS — J441 Chronic obstructive pulmonary disease with (acute) exacerbation: Secondary | ICD-10-CM | POA: Diagnosis present

## 2022-10-01 DIAGNOSIS — R0609 Other forms of dyspnea: Secondary | ICD-10-CM | POA: Diagnosis not present

## 2022-10-01 DIAGNOSIS — J690 Pneumonitis due to inhalation of food and vomit: Secondary | ICD-10-CM | POA: Diagnosis present

## 2022-10-01 DIAGNOSIS — Z79890 Hormone replacement therapy (postmenopausal): Secondary | ICD-10-CM

## 2022-10-01 DIAGNOSIS — J449 Chronic obstructive pulmonary disease, unspecified: Secondary | ICD-10-CM | POA: Diagnosis present

## 2022-10-01 DIAGNOSIS — Y95 Nosocomial condition: Secondary | ICD-10-CM | POA: Diagnosis present

## 2022-10-01 DIAGNOSIS — J189 Pneumonia, unspecified organism: Secondary | ICD-10-CM | POA: Diagnosis present

## 2022-10-01 DIAGNOSIS — S42002D Fracture of unspecified part of left clavicle, subsequent encounter for fracture with routine healing: Secondary | ICD-10-CM

## 2022-10-01 DIAGNOSIS — R197 Diarrhea, unspecified: Secondary | ICD-10-CM | POA: Diagnosis present

## 2022-10-01 DIAGNOSIS — Z833 Family history of diabetes mellitus: Secondary | ICD-10-CM

## 2022-10-01 DIAGNOSIS — Z66 Do not resuscitate: Secondary | ICD-10-CM | POA: Diagnosis present

## 2022-10-01 DIAGNOSIS — Z825 Family history of asthma and other chronic lower respiratory diseases: Secondary | ICD-10-CM

## 2022-10-01 DIAGNOSIS — M069 Rheumatoid arthritis, unspecified: Secondary | ICD-10-CM | POA: Diagnosis present

## 2022-10-01 DIAGNOSIS — R6510 Systemic inflammatory response syndrome (SIRS) of non-infectious origin without acute organ dysfunction: Principal | ICD-10-CM

## 2022-10-01 DIAGNOSIS — Z87891 Personal history of nicotine dependence: Secondary | ICD-10-CM | POA: Diagnosis not present

## 2022-10-01 DIAGNOSIS — W19XXXD Unspecified fall, subsequent encounter: Secondary | ICD-10-CM | POA: Diagnosis present

## 2022-10-01 DIAGNOSIS — M25511 Pain in right shoulder: Secondary | ICD-10-CM | POA: Diagnosis present

## 2022-10-01 DIAGNOSIS — Z9049 Acquired absence of other specified parts of digestive tract: Secondary | ICD-10-CM

## 2022-10-01 DIAGNOSIS — E876 Hypokalemia: Secondary | ICD-10-CM | POA: Diagnosis present

## 2022-10-01 DIAGNOSIS — E118 Type 2 diabetes mellitus with unspecified complications: Secondary | ICD-10-CM

## 2022-10-01 DIAGNOSIS — Z9981 Dependence on supplemental oxygen: Secondary | ICD-10-CM

## 2022-10-01 DIAGNOSIS — Z803 Family history of malignant neoplasm of breast: Secondary | ICD-10-CM

## 2022-10-01 DIAGNOSIS — Z515 Encounter for palliative care: Secondary | ICD-10-CM | POA: Diagnosis not present

## 2022-10-01 DIAGNOSIS — H9190 Unspecified hearing loss, unspecified ear: Secondary | ICD-10-CM | POA: Diagnosis present

## 2022-10-01 DIAGNOSIS — Z7984 Long term (current) use of oral hypoglycemic drugs: Secondary | ICD-10-CM

## 2022-10-01 DIAGNOSIS — Z8249 Family history of ischemic heart disease and other diseases of the circulatory system: Secondary | ICD-10-CM

## 2022-10-01 DIAGNOSIS — R5381 Other malaise: Secondary | ICD-10-CM | POA: Diagnosis present

## 2022-10-01 DIAGNOSIS — J438 Other emphysema: Secondary | ICD-10-CM

## 2022-10-01 DIAGNOSIS — G4733 Obstructive sleep apnea (adult) (pediatric): Secondary | ICD-10-CM | POA: Diagnosis present

## 2022-10-01 DIAGNOSIS — Z79899 Other long term (current) drug therapy: Secondary | ICD-10-CM

## 2022-10-01 DIAGNOSIS — Z7982 Long term (current) use of aspirin: Secondary | ICD-10-CM

## 2022-10-01 DIAGNOSIS — Z5986 Financial insecurity: Secondary | ICD-10-CM

## 2022-10-01 DIAGNOSIS — Z9079 Acquired absence of other genital organ(s): Secondary | ICD-10-CM

## 2022-10-01 LAB — COMPREHENSIVE METABOLIC PANEL
ALT: 17 U/L (ref 0–44)
AST: 16 U/L (ref 15–41)
Albumin: 3.1 g/dL — ABNORMAL LOW (ref 3.5–5.0)
Alkaline Phosphatase: 72 U/L (ref 38–126)
Anion gap: 10 (ref 5–15)
BUN: 19 mg/dL (ref 8–23)
CO2: 20 mmol/L — ABNORMAL LOW (ref 22–32)
Calcium: 9.7 mg/dL (ref 8.9–10.3)
Chloride: 108 mmol/L (ref 98–111)
Creatinine, Ser: 1.23 mg/dL (ref 0.61–1.24)
GFR, Estimated: 56 mL/min — ABNORMAL LOW (ref >60)
Glucose, Bld: 153 mg/dL — ABNORMAL HIGH (ref 70–99)
Potassium: 4 mmol/L (ref 3.5–5.1)
Sodium: 138 mmol/L (ref 135–145)
Total Bilirubin: 1 mg/dL (ref 0.3–1.2)
Total Protein: 7 g/dL (ref 6.5–8.1)

## 2022-10-01 LAB — CBC WITH DIFFERENTIAL/PLATELET
Abs Immature Granulocytes: 0.16 10*3/uL — ABNORMAL HIGH (ref 0.00–0.07)
Basophils Absolute: 0.1 10*3/uL (ref 0.0–0.1)
Basophils Relative: 0 %
Eosinophils Absolute: 0 10*3/uL (ref 0.0–0.5)
Eosinophils Relative: 0 %
HCT: 47.1 % (ref 39.0–52.0)
Hemoglobin: 14.8 g/dL (ref 13.0–17.0)
Immature Granulocytes: 1 %
Lymphocytes Relative: 6 %
Lymphs Abs: 1.4 10*3/uL (ref 0.7–4.0)
MCH: 30 pg (ref 26.0–34.0)
MCHC: 31.4 g/dL (ref 30.0–36.0)
MCV: 95.5 fL (ref 80.0–100.0)
Monocytes Absolute: 1.8 10*3/uL — ABNORMAL HIGH (ref 0.1–1.0)
Monocytes Relative: 8 %
Neutro Abs: 18.7 10*3/uL — ABNORMAL HIGH (ref 1.7–7.7)
Neutrophils Relative %: 85 %
Platelets: 229 10*3/uL (ref 150–400)
RBC: 4.93 MIL/uL (ref 4.22–5.81)
RDW: 15.2 % (ref 11.5–15.5)
WBC: 22.1 10*3/uL — ABNORMAL HIGH (ref 4.0–10.5)
nRBC: 0 % (ref 0.0–0.2)

## 2022-10-01 LAB — MAGNESIUM: Magnesium: 1.5 mg/dL — ABNORMAL LOW (ref 1.7–2.4)

## 2022-10-01 LAB — BRAIN NATRIURETIC PEPTIDE: B Natriuretic Peptide: 158.9 pg/mL — ABNORMAL HIGH (ref 0.0–100.0)

## 2022-10-01 LAB — CG4 I-STAT (LACTIC ACID): Lactic Acid, Venous: 1.7 mmol/L (ref 0.5–1.9)

## 2022-10-01 LAB — PROCALCITONIN: Procalcitonin: <0.1 ng/mL

## 2022-10-01 MED ORDER — SODIUM CHLORIDE 0.9 % IV SOLN: INTRAVENOUS | Status: DC

## 2022-10-01 MED ORDER — ATORVASTATIN CALCIUM 40 MG PO TABS
40.0000 mg | ORAL_TABLET | Freq: Every day | ORAL | Status: DC
  Administered 2022-10-01 – 2022-10-03 (×2): 40 mg via ORAL
  Filled 2022-10-01 (×2): qty 1

## 2022-10-01 MED ORDER — GUAIFENESIN ER 600 MG PO TB12
600.0000 mg | ORAL_TABLET | Freq: Two times a day (BID) | ORAL | Status: DC
  Administered 2022-10-01 – 2022-10-04 (×5): 600 mg via ORAL
  Filled 2022-10-01 (×5): qty 1

## 2022-10-01 MED ORDER — ALBUTEROL SULFATE (2.5 MG/3ML) 0.083% IN NEBU: 2.5000 mg | INHALATION_SOLUTION | RESPIRATORY_TRACT | Status: DC | PRN

## 2022-10-01 MED ORDER — SODIUM CHLORIDE 0.9 % IV SOLN
2.0000 g | Freq: Two times a day (BID) | INTRAVENOUS | Status: DC
  Administered 2022-10-01 – 2022-10-04 (×6): 2 g via INTRAVENOUS
  Filled 2022-10-01 (×6): qty 12.5

## 2022-10-01 MED ORDER — ARFORMOTEROL TARTRATE 15 MCG/2ML IN NEBU
15.0000 ug | INHALATION_SOLUTION | Freq: Two times a day (BID) | RESPIRATORY_TRACT | Status: DC
  Administered 2022-10-02 – 2022-10-04 (×5): 15 ug via RESPIRATORY_TRACT
  Filled 2022-10-01 (×7): qty 2

## 2022-10-01 MED ORDER — VANCOMYCIN HCL IN DEXTROSE 1-5 GM/200ML-% IV SOLN
1000.0000 mg | Freq: Once | INTRAVENOUS | Status: AC
  Administered 2022-10-01: 1000 mg via INTRAVENOUS
  Filled 2022-10-01 (×2): qty 200

## 2022-10-01 MED ORDER — VANCOMYCIN HCL IN DEXTROSE 1-5 GM/200ML-% IV SOLN
1000.0000 mg | Freq: Once | INTRAVENOUS | Status: AC
  Administered 2022-10-01: 1000 mg via INTRAVENOUS

## 2022-10-01 MED ORDER — MAGNESIUM SULFATE 2 GM/50ML IV SOLN
2.0000 g | Freq: Once | INTRAVENOUS | Status: AC
  Administered 2022-10-01: 2 g via INTRAVENOUS
  Filled 2022-10-01: qty 50

## 2022-10-01 MED ORDER — SODIUM CHLORIDE 0.9% FLUSH
3.0000 mL | Freq: Two times a day (BID) | INTRAVENOUS | Status: DC
  Administered 2022-10-01 – 2022-10-04 (×6): 3 mL via INTRAVENOUS

## 2022-10-01 MED ORDER — ACETAMINOPHEN 650 MG RE SUPP: 650.0000 mg | Freq: Four times a day (QID) | RECTAL | Status: DC | PRN

## 2022-10-01 MED ORDER — ACETAMINOPHEN 325 MG PO TABS
650.0000 mg | ORAL_TABLET | Freq: Four times a day (QID) | ORAL | Status: DC | PRN
  Administered 2022-10-02 – 2022-10-03 (×3): 650 mg via ORAL
  Filled 2022-10-01 (×3): qty 2

## 2022-10-01 MED ORDER — MELATONIN 5 MG PO TABS
5.0000 mg | ORAL_TABLET | Freq: Every evening | ORAL | Status: DC
  Administered 2022-10-01 – 2022-10-03 (×2): 5 mg via ORAL
  Filled 2022-10-01 (×2): qty 1

## 2022-10-01 MED ORDER — VANCOMYCIN HCL IN DEXTROSE 1-5 GM/200ML-% IV SOLN
1000.0000 mg | INTRAVENOUS | Status: DC
  Administered 2022-10-02 – 2022-10-03 (×2): 1000 mg via INTRAVENOUS
  Filled 2022-10-01 (×2): qty 200

## 2022-10-01 MED ORDER — POLYVINYL ALCOHOL 1.4 % OP SOLN
1.0000 [drp] | Freq: Three times a day (TID) | OPHTHALMIC | Status: DC
  Administered 2022-10-01 – 2022-10-04 (×8): 1 [drp] via OPHTHALMIC
  Filled 2022-10-01 (×2): qty 15

## 2022-10-01 MED ORDER — SODIUM CHLORIDE 0.9 % IV SOLN
2.0000 g | Freq: Once | INTRAVENOUS | Status: AC
  Administered 2022-10-01: 2 g via INTRAVENOUS
  Filled 2022-10-01: qty 12.5

## 2022-10-01 MED ORDER — ENOXAPARIN SODIUM 40 MG/0.4ML IJ SOSY
40.0000 mg | PREFILLED_SYRINGE | INTRAMUSCULAR | Status: DC
  Administered 2022-10-01 – 2022-10-03 (×3): 40 mg via SUBCUTANEOUS
  Filled 2022-10-01 (×4): qty 0.4

## 2022-10-01 MED ORDER — UMECLIDINIUM BROMIDE 62.5 MCG/ACT IN AEPB
1.0000 | INHALATION_SPRAY | Freq: Every day | RESPIRATORY_TRACT | Status: DC
  Administered 2022-10-02: 1 via RESPIRATORY_TRACT
  Filled 2022-10-01: qty 7

## 2022-10-01 MED ORDER — OXYCODONE HCL 5 MG PO TABS
5.0000 mg | ORAL_TABLET | Freq: Four times a day (QID) | ORAL | Status: DC | PRN
  Administered 2022-10-02 – 2022-10-04 (×5): 5 mg via ORAL
  Filled 2022-10-01 (×5): qty 1

## 2022-10-01 MED ORDER — INSULIN ASPART 100 UNIT/ML IJ SOLN
0.0000 [IU] | Freq: Three times a day (TID) | INTRAMUSCULAR | Status: DC
  Administered 2022-10-02 – 2022-10-03 (×2): 1 [IU] via SUBCUTANEOUS
  Administered 2022-10-03: 3 [IU] via SUBCUTANEOUS
  Administered 2022-10-04: 1 [IU] via SUBCUTANEOUS

## 2022-10-01 MED ORDER — VENLAFAXINE HCL ER 75 MG PO CP24
75.0000 mg | ORAL_CAPSULE | Freq: Every day | ORAL | Status: DC
  Administered 2022-10-01 – 2022-10-04 (×4): 75 mg via ORAL
  Filled 2022-10-01 (×5): qty 1

## 2022-10-01 NOTE — Progress Notes (Signed)
Pt. Confused at this time, Rn is aware. Cpap on hold for now.

## 2022-10-01 NOTE — ED Provider Notes (Signed)
[place] EMERGENCY DEPARTMENT AT [facility] Provider Note   CSN: [id number] Arrival date & time: 10/01/22  1044     History  Chief Complaint  Patient presents with   Weakness    Lance Powell is a 89 y.o. male.  HPI Notably male presents via EMS from his facility where he is found to be hypoxic.  Patient typically wears 4 L nasal cannula was found to have 84% saturation on that amount today.  Patient also complains of cough, and EMS reports the patient did desaturate 96% with 6 L via nasal cannula.  He subsequently joined by his daughters after my initial intervention/evaluation.  They note that the patient has increased coughing, choking sensation when producing sputum.     Home Medications Prior to Admission medications   Medication Sig Start Date End Date Taking? Authorizing Provider  albuterol (VENTOLIN HFA) 108 (90 Base) MCG/ACT inhaler Inhale 2 puffs into the lungs every 6 (six) hours as needed for wheezing or shortness of breath. Patient taking differently: Inhale 2 puffs into the lungs every 6 (six) hours as needed for wheezing or shortness of breath (COPD). 11/29/21  Yes Cobb, Ruby Cola, NP  aspirin 81 MG tablet Take 1 tablet (81 mg total) by mouth every morning. Stop for 1 week and then resume Patient taking differently: Take 81 mg by mouth daily. 01/08/12  Yes Vassie Loll, MD  atorvastatin (LIPITOR) 40 MG tablet TAKE 1 TABLET BY MOUTH EVERY DAY Patient taking differently: Take 40 mg by mouth at bedtime. 07/17/21  Yes [name], Ramon Dredge, PA-C  carboxymethylcellulose (ARTIFICIAL TEARS) 1 % ophthalmic solution Place 1 drop into both eyes 3 (three) times daily.   Yes [provider]  celecoxib (CELEBREX) 200 MG capsule 1 tab po daily prn pain(advise use sparingly based on age and kidney function) Patient taking differently: Take 200 mg by mouth daily as needed for mild pain or moderate pain. 01/30/22  Yes [name], Ramon Dredge, PA-C  cholecalciferol  (VITAMIN D3) 25 MCG (1000 UNIT) tablet Take 1,000 Units by mouth daily.   Yes [provider]  dextromethorphan-guaiFENesin (MUCINEX DM) 30-600 MG 12hr tablet Take 1 tablet by mouth 2 (two) times daily.   Yes [provider]  linagliptin (TRADJENTA) 5 MG TABS tablet Take 5 mg by mouth daily.   Yes [provider]  loperamide (IMODIUM A-D) 2 MG tablet Take 2 mg by mouth every 6 (six) hours as needed for diarrhea or loose stools.   Yes [provider]  Melatonin 5 MG CAPS Take 5 mg by mouth every evening.   Yes [provider]  metFORMIN (GLUCOPHAGE) 500 MG tablet TAKE 1 TABLET(500 MG) BY MOUTH THREE TIMES DAILY Patient taking differently: Take 750 mg by mouth in the morning and at bedtime. 07/04/22  Yes [name], Ramon Dredge, PA-C  misoprostol (CYTOTEC) 100 MCG tablet TAKE 1 TABLET BY MOUTH TWICE DAILY AND 2 TABLETS AT BEDTIME Patient taking differently: Take 100-200 mcg by mouth See admin instructions. Take 100 mcg twice daily. Take 200 mcg at bedtime. 05/10/22  Yes [name], Ramon Dredge, PA-C  Multiple Vitamins-Minerals (CENTRUM) tablet Take 1 tablet by mouth daily.   Yes [provider]  oxyCODONE (ROXICODONE) 5 MG immediate release tablet Take 1 tablet (5 mg total) by mouth every 6 (six) hours as needed for severe pain. 09/27/22  Yes Eber Hong, MD  OXYGEN Inhale 2 L/min into the lungs continuous.   Yes [provider]  polyethylene glycol (MIRALAX) 17 g packet Take 17  g by mouth daily as needed for mild constipation or moderate constipation.   Yes [provider]  PRESCRIPTION MEDICATION Inhale 1 Device into the lungs See admin instructions. CPAP- At bedtime   Yes [provider]  Psyllium (METAMUCIL) 48.57 % POWD Take 5 mLs by mouth daily as needed (constipation).   Yes [provider]  sennosides-docusate sodium (SENOKOT-S) 8.6-50 MG tablet Take 1 tablet by mouth daily.   Yes [provider]  Tiotropium  Bromide-Olodaterol (STIOLTO RESPIMAT) 2.5-2.5 MCG/ACT AERS Inhale 2 puffs into the lungs daily. Patient taking differently: Inhale 1 puff into the lungs daily. 04/04/21  Yes Cobb, Ruby Cola, NP  venlafaxine (EFFEXOR) 75 MG tablet Take 75 mg by mouth daily.   Yes [provider]  vitamin C (ASCORBIC ACID) 500 MG tablet Take 500 mg by mouth daily.   Yes [provider]  CONTOUR NEXT TEST test strip USE AS DIRECTED TO CHECK BLOOD SUGAR TWICE DAILY 04/13/21   [name], Ramon Dredge, PA-C  dexamethasone (DECADRON) 6 MG tablet Take 6 mg by mouth daily. Patient not taking: Reported on 10/01/2022    [provider]      Allergies    Patient has no known allergies.    Review of Systems   Review of Systems  All other systems reviewed and are negative.   Physical Exam Updated Vital Signs BP 126/76 (BP Location: Right Arm)   Pulse (!) 104   Temp (!) 97.3 F (36.3 C) (Oral)   Resp 19   Wt 106.6 kg   SpO2 91%   BMI 31.87 kg/m  Physical Exam Vitals and nursing note reviewed.  Constitutional:      General: He is not in acute distress.    Appearance: He is well-developed. He is ill-appearing.  HENT:     Head: Normocephalic and atraumatic.  Eyes:     Conjunctiva/sclera: Conjunctivae normal.  Cardiovascular:     Rate and Rhythm: Regular rhythm. Tachycardia present.  Pulmonary:     Effort: Tachypnea and accessory muscle usage present.     Breath sounds: Decreased air movement present. No stridor.  Abdominal:     General: There is no distension.  Musculoskeletal:     Comments: Existing L clavicle Fx  Skin:    General: Skin is warm and dry.  Neurological:     Mental Status: He is alert and oriented to person, place, and time.     ED Results / Procedures / Treatments   Labs (all labs ordered are listed, but only abnormal results are displayed) Labs Reviewed  COMPREHENSIVE METABOLIC PANEL - Abnormal; Notable for the following components:      Result Value   CO2  20 (*)    Glucose, Bld 153 (*)    Albumin 3.1 (*)    GFR, Estimated 56 (*)    All other components within normal limits  CBC WITH DIFFERENTIAL/PLATELET - Abnormal; Notable for the following components:   WBC 22.1 (*)    Neutro Abs 18.7 (*)    Monocytes Absolute 1.8 (*)    Abs Immature Granulocytes 0.16 (*)    All other components within normal limits  BRAIN NATRIURETIC PEPTIDE - Abnormal; Notable for the following components:   B Natriuretic Peptide 158.9 (*)    All other components within normal limits    EKG EKG Interpretation Date/Time:  Tuesday October 01 2022 10:53:05 EDT Ventricular Rate:  102 PR Interval:  211 QRS Duration:  100 QT Interval:  363 QTC Calculation: 473 R  Axis:   85  Text Interpretation: Sinus tachycardia Atrial premature complexes Borderline prolonged PR interval Borderline right axis deviation Confirmed by Gerhard Munch [phone]) on 10/01/2022 12:43:29 PM  Radiology DG Chest Port 1 View  Result Date: 10/01/2022 CLINICAL DATA:  Shortness of breath EXAM: PORTABLE CHEST 1 VIEW COMPARISON:  X-ray 09/16/2022 x-ray.  CT scan 05/16/2022 FINDINGS: Hyperinflation with chronic lung changes identified. There is slight increase in patchy opacity left lung base and retrocardiac. Mild linear opacity in the right lung base. No pneumothorax or effusion. Stable cardiopericardial silhouette with a calcified aorta. Overlapping cardiac leads. Surgical changes in the epigastric region. Sclerotic focus again identified along the left first rib. IMPRESSION: Hyperinflation with chronic changes. Increasing left retrocardiac opacity. Possible acute infiltrate. Recommend follow-up Electronically Signed   By: Karen Kays M.D.   On: 10/01/2022 13:49    Procedures Procedures    Medications Ordered in ED Medications  ceFEPIme (MAXIPIME) 2 g in sodium chloride 0.9 % 100 mL IVPB (has no administration in time range)  vancomycin (VANCOCIN) IVPB 1000 mg/200 mL premix (has no  administration in time range)    And  vancomycin (VANCOCIN) IVPB 1000 mg/200 mL premix (has no administration in time range)    ED Course/ Medical Decision Making/ A&P                                 Medical Decision Making Early male, nursing home resident, on home oxygen presents with cough, hypoxia, concern for pneumonia, bacteremia, sepsis. Less likely PE given his denial of any chest pain.  Patient meets SIRS criteria with tachypnea, rate 30s, heart rate in the 100s, and with leukocytosis, x-ray suggesting pneumonia, the patient was admitted after initiation of broad-spectrum antibiotics for healthcare acquired pneumonia.   Amount and/or Complexity of Data Reviewed Independent Historian: EMS    Details: EMS and adult children as a above External Data Reviewed: notes. Labs: ordered. Decision-making details documented in ED Course. Radiology: ordered and independent interpretation performed. Decision-making details documented in ED Course. ECG/medicine tests: ordered and independent interpretation performed.  Risk Prescription drug management. Decision regarding hospitalization.   2:54 PM Children, patient aware of all findings, concern for pneumonia, SIRS, oxygen requirement now 4 L, patient admitted for H CAP.  No evidence for sepsis currently.         Final Clinical Impression(s) / ED Diagnoses Final diagnoses:  SIRS (systemic inflammatory response syndrome) (HCC)  HCAP (healthcare-associated pneumonia)  CRITICAL CARE Performed by: Gerhard Munch Total critical care time: 35 minutes Critical care time was exclusive of separately billable procedures and treating other patients. Critical care was necessary to treat or prevent imminent or life-threatening deterioration. Critical care was time spent personally by me on the following activities: development of treatment plan with patient and/or surrogate as well as nursing, discussions with consultants, evaluation of  patient's response to treatment, examination of patient, obtaining history from patient or surrogate, ordering and performing treatments and interventions, ordering and review of laboratory studies, ordering and review of radiographic studies, pulse oximetry and re-evaluation of patient's condition.    Gerhard Munch, MD 10/01/22 [phone]

## 2022-10-01 NOTE — Progress Notes (Signed)
ED Pharmacy Antibiotic Sign Off An antibiotic consult was received from an ED provider for vancomycin per pharmacy dosing for pneumonia. A chart review was completed to assess appropriateness.   The following one time order(s) were placed:  Vancomycin 1000mg  IV x2 doses for total of 2000mg    Further antibiotic and/or antibiotic pharmacy consults should be ordered by the admitting provider if indicated.   Thank you for allowing pharmacy to be a part of this patient's care.   Smitty Cords, [facility]  Clinical Pharmacist 10/01/22 2:41 PM

## 2022-10-01 NOTE — H&P (Addendum)
History and Physical    Patient: Lance Powell SWF:[id number] DOB: 1929-12-08 DOA: 10/01/2022 DOS: the patient was seen and examined on 10/01/2022 PCP: Esperanza Richters, PA-C  Patient coming from: SNF via EMS  Chief Complaint:  Chief Complaint  Patient presents with   Weakness   HPI: Lance Powell is a 89 y.o. male with medical history significant of hypertension, hyperlipidemia, COPD on 2-3 L of oxygen at baseline, DM type II,  arthritis, and sleep apnea who presented with complaints of weakness.  History is obtained from review of records and his daughter who is present at bedside.  He had just recently been hospitalized from 8/26-8/28 at [facility] due to worsening cough after initially being diagnosed with COVID-19 on 8/12.  Patient did not meet criteria for Paxlovid or remdesivir.  He was noted to have COPD exacerbation treated with steroids, AKI due to the diarrhea treated with IV fluids.  C. difficile testing was negative.  After discharge patient had a fall on 9/6 and was found to have a closed displaced left clavicle fracture for which she was placed in a sling.  Over the last couple days patient has had continued nausea, vomiting, diarrhea.  Daughter makes note that she has noticed that he has had a productive cough and had some complaints of abdominal pain.  Daughter makes note that he had been seeing people that had passed away acting like his normal self.  At the facility they had noted his O2 saturations to be as low as 81% on his normal 2 L of oxygen for which they sent him to the hospital for further evaluation.  In the emergency department patient was noted to be afebrile, pulse 100-104, respirations 19-27, and O2 saturations currently maintained on 2 L of nasal cannula oxygen.  Labs and for WBC 22.1, glucose 153, and BNP 158.9.  Chest x-ray noted hyperinflation with chronic changes and increased left retrocardiac opacity concerning for possible  infiltrate.  Patient had been started on empiric antibiotics of vancomycin and cefepime.   Review of Systems: As mentioned in the history of present illness. All other systems reviewed and are negative. 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   COPD (chronic obstructive pulmonary disease) (HCC)    Depression 03/08/2014   Diabetes mellitus    GERD (gastroesophageal reflux disease) 03/08/2014   History of blood transfusion    Hyperlipemia    Hypertension    OSA (obstructive sleep apnea)    Osteoarthritis    Rheumatoid arthritis(714.0)    Sleep apnea    cpap   Past Surgical History:  Procedure Laterality Date   APPENDECTOMY  1969   CHOLECYSTECTOMY N/A 06/16/2014   Procedure: LAPAROSCOPIC CHOLECYSTECTOMY;  Surgeon: Gaynelle Adu, MD;  Location: [facility];  Service: General;  Laterality: N/A;   CYSTOSCOPY  01/23/2011   Procedure: Bluford Kaufmann;  Surgeon: Milford Cage, MD;  Location: [facility];  Service: Urology;  Laterality: N/A;   FLEXIBLE SIGMOIDOSCOPY  01/07/2012   Procedure: FLEXIBLE SIGMOIDOSCOPY;  Surgeon: Meryl Dare, MD,FACG;  Location: [facility];  Service: Endoscopy;  Laterality: N/A;   HERNIA REPAIR  1990   right and left inguinal   NISSEN FUNDOPLICATION     SIGMOIDECTOMY  2004   colovesical fistula   TONSILLECTOMY  1937   TRANSURETHRAL RESECTION OF PROSTATE  01/23/2011   Procedure: TRANSURETHRAL RESECTION OF THE PROSTATE WITH GYRUS INSTRUMENTS;  Surgeon: Bettye Boeck  Margarita Grizzle, MD;  Location: [facility];  Service: Urology;  Laterality: N/A;   VASECTOMY  1972   Social History:  reports that he quit smoking about 44 years ago. His smoking use included cigarettes. He started smoking about 74 years ago. He has a 120 pack-year smoking history. He has never used smokeless tobacco. He reports that he does not drink alcohol and does not use drugs.  No Known Allergies  Family History  Problem  Relation Age of Onset   Heart disease Mother    Diabetes Mother    Emphysema Brother    Breast cancer Daughter    Cancer Daughter        breast   Stomach cancer Neg Hx    Esophageal cancer Neg Hx    Colon cancer Neg Hx    Liver cancer Neg Hx    Pancreatic cancer Neg Hx    Rectal cancer Neg Hx     Prior to Admission medications   Medication Sig Start Date End Date Taking? Authorizing Provider  albuterol (VENTOLIN HFA) 108 (90 Base) MCG/ACT inhaler Inhale 2 puffs into the lungs every 6 (six) hours as needed for wheezing or shortness of breath. Patient taking differently: Inhale 2 puffs into the lungs every 6 (six) hours as needed for wheezing or shortness of breath (COPD). 11/29/21  Yes Cobb, Ruby Cola, NP  aspirin 81 MG tablet Take 1 tablet (81 mg total) by mouth every morning. Stop for 1 week and then resume Patient taking differently: Take 81 mg by mouth daily. 01/08/12  Yes Vassie Loll, MD  atorvastatin (LIPITOR) 40 MG tablet TAKE 1 TABLET BY MOUTH EVERY DAY Patient taking differently: Take 40 mg by mouth at bedtime. 07/17/21  Yes [name], Ramon Dredge, PA-C  carboxymethylcellulose (ARTIFICIAL TEARS) 1 % ophthalmic solution Place 1 drop into both eyes 3 (three) times daily.   Yes [provider]  celecoxib (CELEBREX) 200 MG capsule 1 tab po daily prn pain(advise use sparingly based on age and kidney function) Patient taking differently: Take 200 mg by mouth daily as needed for mild pain or moderate pain. 01/30/22  Yes [name], Ramon Dredge, PA-C  cholecalciferol (VITAMIN D3) 25 MCG (1000 UNIT) tablet Take 1,000 Units by mouth daily.   Yes [provider]  dextromethorphan-guaiFENesin (MUCINEX DM) 30-600 MG 12hr tablet Take 1 tablet by mouth 2 (two) times daily.   Yes [provider]  linagliptin (TRADJENTA) 5 MG TABS tablet Take 5 mg by mouth daily.   Yes [provider]  loperamide (IMODIUM A-D) 2 MG tablet Take 2 mg by mouth every 6 (six) hours as needed for  diarrhea or loose stools.   Yes [provider]  Melatonin 5 MG CAPS Take 5 mg by mouth every evening.   Yes [provider]  metFORMIN (GLUCOPHAGE) 500 MG tablet TAKE 1 TABLET(500 MG) BY MOUTH THREE TIMES DAILY Patient taking differently: Take 750 mg by mouth in the morning and at bedtime. 07/04/22  Yes [name], Ramon Dredge, PA-C  misoprostol (CYTOTEC) 100 MCG tablet TAKE 1 TABLET BY MOUTH TWICE DAILY AND 2 TABLETS AT BEDTIME Patient taking differently: Take 100-200 mcg by mouth See admin instructions. Take 100 mcg twice daily. Take 200 mcg at bedtime. 05/10/22  Yes [name], Ramon Dredge, PA-C  Multiple Vitamins-Minerals (CENTRUM) tablet Take 1 tablet by mouth daily.   Yes [provider]  oxyCODONE (ROXICODONE) 5 MG immediate release tablet Take 1 tablet (5 mg total) by mouth every 6 (six) hours as needed for severe  pain. 09/27/22  Yes Eber Hong, MD  OXYGEN Inhale 2 L/min into the lungs continuous.   Yes [provider]  polyethylene glycol (MIRALAX) 17 g packet Take 17 g by mouth daily as needed for mild constipation or moderate constipation.   Yes [provider]  PRESCRIPTION MEDICATION Inhale 1 Device into the lungs See admin instructions. CPAP- At bedtime   Yes [provider]  Psyllium (METAMUCIL) 48.57 % POWD Take 5 mLs by mouth daily as needed (constipation).   Yes [provider]  sennosides-docusate sodium (SENOKOT-S) 8.6-50 MG tablet Take 1 tablet by mouth daily.   Yes [provider]  Tiotropium Bromide-Olodaterol (STIOLTO RESPIMAT) 2.5-2.5 MCG/ACT AERS Inhale 2 puffs into the lungs daily. Patient taking differently: Inhale 1 puff into the lungs daily. 04/04/21  Yes Cobb, Ruby Cola, NP  venlafaxine (EFFEXOR) 75 MG tablet Take 75 mg by mouth daily.   Yes [provider]  vitamin C (ASCORBIC ACID) 500 MG tablet Take 500 mg by mouth daily.   Yes [provider]  CONTOUR NEXT TEST test strip USE AS DIRECTED TO  CHECK BLOOD SUGAR TWICE DAILY 04/13/21   [name], Ramon Dredge, PA-C  dexamethasone (DECADRON) 6 MG tablet Take 6 mg by mouth daily. Patient not taking: Reported on 10/01/2022    [provider]    Physical Exam: Vitals:   10/01/22 1200 10/01/22 1315 10/01/22 1400 10/01/22 1452  BP: 121/83 126/76  135/74  Pulse: 100 (!) 104  100  Resp:  19  (!) 27  Temp:    97.6 F (36.4 C)  TempSrc:    Oral  SpO2: 92% 91%  92%  Weight:   106.6 kg    Constitutional: Elderly male who appears ill Eyes: PERRL, lids and conjunctivae normal ENMT: Mucous membranes are moist.  Poor dentition.  Hard of hearing. Neck: normal, supple    Respiratory: clear to auscultation bilaterally, no wheezing, no crackles. Normal respiratory effort. No accessory muscle use.  Cardiovascular: Regular rate and rhythm, no murmurs / rubs / gallops. No extremity edema. 2+ pedal pulses. No carotid bruits.  Abdomen: no tenderness, no masses palpated.  Ventral hernia present which is reducible.  Bowel sounds positive.  Musculoskeletal: no clubbing / cyanosis. No joint deformity upper and lower extremities. Good ROM, no contractures. Normal muscle tone.  Skin: no rashes, lesions, ulcers. No induration Neurologic: CN 2-12 grossly intact. Able to move all extremities. Psychiatric:  Alert and oriented x 3. Normal mood.   Data Reviewed:  EKG revealed sinus tachycardia 102 bpm with prolonged PR interval.  Reviewed labs, imaging, and pertinent records as documented in this note.  Assessment and Plan:  Chronic respiratory failure with hypoxia Sepsis secondary to pneumonia Patient presented with cough and had been initially noted to be hypoxic down to 81% on his normal 2 L of oxygen. Patient was tachypneic and initial white blood cell count elevated at 22.1 given concern for sepsis.  Chest x-ray concerning for a left retrocardiac opacity. Blood cultures have been obtained. Question of healthcare associated pneumonia versus possible  aspiration given recent episodes of vomiting.  From patient was started on empiric antibiotics of vancomycin and cefepime given recent hospitalization for Covid-19 infection.  O2 saturations currently maintained back on 2 L nasal cannula oxygen. -Admit to a telemetry bed -Continuous pulse oximetry with nasal cannula oxygen maintain O2 saturation greater than 92% -Follow-up blood cultures and sputum cultures if able to be obtained -Check urinalysis -Check procalcitonin, Urine legionella, and urine strep -Continue  empiric antibiotics of vancomycin and cefepime.  De-escalate when medically appropriate -Check CT scan of the chest, abdomen, pelvis -Mucinex -Recheck CBC tomorrow morning  Acute metabolic encephalopathy Patient reportedly admitted altered and was speaking to family members who had passed.  At baseline daughter reports patient is alert and oriented x 3.  Question of secondary to acute infection. -Neurochecks  Nausea, vomiting, and diarrhea Daughter reports over the last few days patient has had persistent nausea, vomiting, and diarrhea.  During previous hospitalization diarrhea was thought secondary to COVID-19 and he was noted to be negative for C. difficile.  Patient has prior history of partial small bowel obstruction back in 2016. -Monitor intake and output -Check C. difficile -Antiemetics as needed -Held misoprostol in case this is leading to the patient's diarrhea  COPD Patient without significant wheezing appreciated at this time.    -Albuterol nebs as needed for shortness of breath/wheezing -Continue pharmacy substitution of Brovana and Incruse  Left clavicle fracture secondary to fall Prior to arrival.  Left clavicle fracture sustained after fall on 9/6. -continue sling  History of COVID-19 infection Patient had previously been diagnosed with COVID-19 on 8/22 at his facility.  Diabetes mellitus type 2, without long-term use of insulin On admission glucose  153. -Hypoglycemic protocols -Hold metformin and linagliptin -CBGs before every meal with very sensitive SSI -Adjust insulin regimen as needed  Hiatal hernia Chronic.  Patient has a midline hiatal hernia that is reducible.  Bowel sounds present.  Hyperlipidemia -Continue atorvastatin  Deconditioning -PT to evaluate and treat  OSA on CPAP -Continue CPAP at night  DVT prophylaxis: Lovenox  Advance Care Planning:   Code Status: Do not attempt resuscitation (DNR) PRE-ARREST INTERVENTIONS DESIRED   Consults: None  Family Communication: Daughter updated at bedside.  Severity of Illness: The appropriate patient status for this patient is INPATIENT. Inpatient status is judged to be reasonable and necessary in order to provide the required intensity of service to ensure the patient's safety. The patient's presenting symptoms, physical exam findings, and initial radiographic and laboratory data in the context of their chronic comorbidities is felt to place them at high risk for further clinical deterioration. Furthermore, it is not anticipated that the patient will be medically stable for discharge from the hospital within 2 midnights of admission.   * I certify that at the point of admission it is my clinical judgment that the patient will require inpatient hospital care spanning beyond 2 midnights from the point of admission due to high intensity of service, high risk for further deterioration and high frequency of surveillance required.*  Author: Clydie Braun, MD 10/01/2022 3:34 PM  For on call review [email or web address].

## 2022-10-01 NOTE — Progress Notes (Signed)
Pharmacy Antibiotic Note  Lance Powell is a 89 y.o. male admitted on 10/01/2022 with pneumonia.  Pharmacy has been consulted for vanc dosing.  Pt was recently dc from [facility] for COVID. He is being admitted here to r/o PNA. Vanc/cefepime ordered.  Scr 1.23  Plan: Vanc 2g x1 in ED then 1g IV q24>>AUC 463, scr 1.23 MRSA PCR Levels if needed  Weight: 106.6 kg (235 lb)  Temp (24hrs), Avg:97.5 F (36.4 C), Min:97.3 F (36.3 C), Max:97.6 F (36.4 C)  Recent Labs  Lab 10/01/22 1121 10/01/22 1626  WBC 22.1*  --   CREATININE 1.23  --   LATICACIDVEN  --  1.7    Estimated Creatinine Clearance: 50.4 mL/min (by C-G formula based on SCr of 1.23 mg/dL).    No Known Allergies  Antimicrobials this admission: 9/10 vanc>> 9/10 cefepime>>  Dose adjustments this admission:   Microbiology results:    Ulyses Southward, PharmD, BCIDP, AAHIVP, CPP Infectious Disease Pharmacist 10/01/2022 8:25 PM

## 2022-10-01 NOTE — ED Notes (Signed)
ED TO INPATIENT HANDOFF REPORT  ED Nurse Name and Phone #: [phone]  S Name/Age/Gender Lance Powell 89 y.o. male Room/Bed: 001C/001C  Code Status   Code Status: Prior  Home/SNF/Other Skilled nursing facility Patient oriented to: self, place, and time Is this baseline? No   Triage Complete: Triage complete  Chief Complaint Sepsis due to pneumonia (HCC) [J18.9, A41.9]  Triage Note Patient arrives from [facility] after staff found him hypoxic (84%) on his normal 4L O2 this morning. Patient denies sob, though does endorse generalized weakness. Daughter states diarrhea x 2 months and frequent vomiting. Denies abdominal pain.    Allergies No Known Allergies  Level of Care/Admitting Diagnosis ED Disposition     ED Disposition  Admit   Condition  --   Comment  Hospital Area: [facility] [[id number]]  Level of Care: Telemetry Medical [104]  May admit patient to [facility] or [facility] if equivalent level of care is available:: No  Covid Evaluation: Asymptomatic - no recent exposure (last 10 days) testing not required  Diagnosis: Sepsis due to pneumonia [facility]) [[id number]]  Admitting Physician: Clydie Braun [[id number]]  Attending Physician: Clydie Braun [[id number]]  Certification:: I certify this patient will need inpatient services for at least 2 midnights  Expected Medical Readiness: 10/03/2022          B Medical/Surgery History 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   COPD (chronic obstructive pulmonary disease) (HCC)    Depression 03/08/2014   Diabetes mellitus    GERD (gastroesophageal reflux disease) 03/08/2014   History of blood transfusion    Hyperlipemia    Hypertension    OSA (obstructive sleep apnea)    Osteoarthritis    Rheumatoid arthritis(714.0)    Sleep apnea    cpap   Past Surgical History:  Procedure Laterality Date   APPENDECTOMY  1969   CHOLECYSTECTOMY N/A 06/16/2014    Procedure: LAPAROSCOPIC CHOLECYSTECTOMY;  Surgeon: Gaynelle Adu, MD;  Location: [facility];  Service: General;  Laterality: N/A;   CYSTOSCOPY  01/23/2011   Procedure: Bluford Kaufmann;  Surgeon: Milford Cage, MD;  Location: [facility];  Service: Urology;  Laterality: N/A;   FLEXIBLE SIGMOIDOSCOPY  01/07/2012   Procedure: FLEXIBLE SIGMOIDOSCOPY;  Surgeon: Meryl Dare, MD,FACG;  Location: [facility];  Service: Endoscopy;  Laterality: N/A;   HERNIA REPAIR  1990   right and left inguinal   NISSEN FUNDOPLICATION     SIGMOIDECTOMY  2004   colovesical fistula   TONSILLECTOMY  1937   TRANSURETHRAL RESECTION OF PROSTATE  01/23/2011   Procedure: TRANSURETHRAL RESECTION OF THE PROSTATE WITH GYRUS INSTRUMENTS;  Surgeon: Milford Cage, MD;  Location: [facility];  Service: Urology;  Laterality: N/A;   VASECTOMY  1972     A IV Location/Drains/Wounds Patient Lines/Drains/Airways Status     Active Line/Drains/Airways     Name Placement date Placement time Site Days   Peripheral IV 10/01/22 20 G Anterior;Right Forearm 10/01/22  1118  Forearm  less than 1   Biliary Tube Cook slip-coat 10 Fr. RUQ 02/27/14  1114  RUQ  3138   Incision - 5 Ports Abdomen 1: Umbilicus 2: Upper;Medial 3: Right;Upper 4: Right;Lower 5: Left;Mid;Upper 06/16/14  1233  -- 3029            Intake/Output Last 24 hours No intake or output data in the 24 hours ending 10/01/22 1551  Labs/Imaging Results for  orders placed or performed during the hospital encounter of 10/01/22 (from the past 48 hour(s))  Comprehensive metabolic panel     Status: Abnormal   Collection Time: 10/01/22 11:21 AM  Result Value Ref Range   Sodium 138 135 - 145 mmol/L   Potassium 4.0 3.5 - 5.1 mmol/L   Chloride 108 98 - 111 mmol/L   CO2 20 (L) 22 - 32 mmol/L   Glucose, Bld 153 (H) 70 - 99 mg/dL    Comment: Glucose reference range applies only to samples taken after fasting for at least 8 hours.   BUN 19 8 - 23  mg/dL   Creatinine, Ser 9.56 0.61 - 1.24 mg/dL   Calcium 9.7 8.9 - 21.3 mg/dL   Total Protein 7.0 6.5 - 8.1 g/dL   Albumin 3.1 (L) 3.5 - 5.0 g/dL   AST 16 15 - 41 U/L   ALT 17 0 - 44 U/L   Alkaline Phosphatase 72 38 - 126 U/L   Total Bilirubin 1.0 0.3 - 1.2 mg/dL   GFR, Estimated 56 (L) >60 mL/min    Comment: (NOTE) Calculated using the CKD-EPI Creatinine Equation (2021)    Anion gap 10 5 - 15    Comment: Performed at [facility], [address], [place], [place] [address]  CBC with Differential/Platelet     Status: Abnormal   Collection Time: 10/01/22 11:21 AM  Result Value Ref Range   WBC 22.1 (H) 4.0 - 10.5 K/uL   RBC 4.93 4.22 - 5.81 MIL/uL   Hemoglobin 14.8 13.0 - 17.0 g/dL   HCT 84.6 96.2 - 95.2 %   MCV 95.5 80.0 - 100.0 fL   MCH 30.0 26.0 - 34.0 pg   MCHC 31.4 30.0 - 36.0 g/dL   RDW 84.1 32.4 - 40.1 %   Platelets 229 150 - 400 K/uL   nRBC 0.0 0.0 - 0.2 %   Neutrophils Relative % 85 %   Neutro Abs 18.7 (H) 1.7 - 7.7 K/uL   Lymphocytes Relative 6 %   Lymphs Abs 1.4 0.7 - 4.0 K/uL   Monocytes Relative 8 %   Monocytes Absolute 1.8 (H) 0.1 - 1.0 K/uL   Eosinophils Relative 0 %   Eosinophils Absolute 0.0 0.0 - 0.5 K/uL   Basophils Relative 0 %   Basophils Absolute 0.1 0.0 - 0.1 K/uL   Immature Granulocytes 1 %   Abs Immature Granulocytes 0.16 (H) 0.00 - 0.07 K/uL    Comment: Performed at [facility], [address], [place], [place] [address]  Brain natriuretic peptide     Status: Abnormal   Collection Time: 10/01/22 11:21 AM  Result Value Ref Range   B Natriuretic Peptide 158.9 (H) 0.0 - 100.0 pg/mL    Comment: Performed at [facility], [address] [place], [place] [address]   DG Chest Port 1 View  Result Date: 10/01/2022 CLINICAL DATA:  Shortness of breath EXAM: PORTABLE CHEST 1 VIEW COMPARISON:  X-ray 09/16/2022 x-ray.  CT scan 05/16/2022 FINDINGS: Hyperinflation with chronic lung changes identified. There is slight increase in patchy  opacity left lung base and retrocardiac. Mild linear opacity in the right lung base. No pneumothorax or effusion. Stable cardiopericardial silhouette with a calcified aorta. Overlapping cardiac leads. Surgical changes in the epigastric region. Sclerotic focus again identified along the left first rib. IMPRESSION: Hyperinflation with chronic changes. Increasing left retrocardiac opacity. Possible acute infiltrate. Recommend follow-up Electronically Signed   By: Piedad Climes.D.  On: 10/01/2022 13:49    Pending Labs Unresulted Labs (From admission, onward)    None       Vitals/Pain Today's Vitals   10/01/22 1200 10/01/22 1315 10/01/22 1400 10/01/22 1452  BP: 121/83 126/76  135/74  Pulse: 100 (!) 104  100  Resp:  19  (!) 27  Temp:    97.6 F (36.4 C)  TempSrc:    Oral  SpO2: 92% 91%  92%  Weight:   106.6 kg   PainSc:        Isolation Precautions No active isolations  Medications Medications  vancomycin (VANCOCIN) IVPB 1000 mg/200 mL premix (has no administration in time range)    And  vancomycin (VANCOCIN) IVPB 1000 mg/200 mL premix (1,000 mg Intravenous New Bag/Given 10/01/22 1527)  ceFEPIme (MAXIPIME) 2 g in sodium chloride 0.9 % 100 mL IVPB (0 g Intravenous Stopped 10/01/22 1527)    Mobility non-ambulatory     Focused Assessments Cardiac Assessment Handoff:    Lab Results  Component Value Date   TROPONINI <0.03 04/19/2016   Lab Results  Component Value Date   DDIMER 0.37 05/16/2022   Does the Patient currently have chest pain? No    R Recommendations: See Admitting Provider Note  Report given to:   Additional Notes:

## 2022-10-01 NOTE — ED Triage Notes (Signed)
Patient arrives from [facility] after staff found him hypoxic (84%) on his normal 4L O2 this morning. Patient denies sob, though does endorse generalized weakness. Daughter states diarrhea x 2 months and frequent vomiting. Denies abdominal pain.

## 2022-10-01 NOTE — Progress Notes (Signed)
   10/01/22 2158  BiPAP/CPAP/SIPAP  BiPAP/CPAP/SIPAP Pt Type Adult  BiPAP/CPAP/SIPAP Resmed  Mask Type Full face mask  Mask Size Large  Respiratory Rate 18 breaths/min  EPAP 10 cmH2O  Flow Rate 4 lpm  Patient Home Equipment No  Auto Titrate No  BiPAP/CPAP /SiPAP Vitals  Resp 18  MEWS Score/Color  MEWS Score 1  MEWS Score Color Green

## 2022-10-02 ENCOUNTER — Encounter (HOSPITAL_COMMUNITY): Payer: Self-pay | Admitting: Internal Medicine

## 2022-10-02 ENCOUNTER — Inpatient Hospital Stay (HOSPITAL_COMMUNITY): Payer: Medicare Other

## 2022-10-02 DIAGNOSIS — J189 Pneumonia, unspecified organism: Secondary | ICD-10-CM | POA: Diagnosis not present

## 2022-10-02 DIAGNOSIS — A419 Sepsis, unspecified organism: Secondary | ICD-10-CM | POA: Diagnosis not present

## 2022-10-02 LAB — URINALYSIS, W/ REFLEX TO CULTURE (INFECTION SUSPECTED)
Bilirubin Urine: NEGATIVE
Glucose, UA: NEGATIVE mg/dL
Hgb urine dipstick: NEGATIVE
Ketones, ur: NEGATIVE mg/dL
Leukocytes,Ua: NEGATIVE
Nitrite: NEGATIVE
Protein, ur: NEGATIVE mg/dL
Specific Gravity, Urine: 1.016 (ref 1.005–1.030)
pH: 5 (ref 5.0–8.0)

## 2022-10-02 LAB — CBC
HCT: 42.2 % (ref 39.0–52.0)
Hemoglobin: 13.4 g/dL (ref 13.0–17.0)
MCH: 30.5 pg (ref 26.0–34.0)
MCHC: 31.8 g/dL (ref 30.0–36.0)
MCV: 96.1 fL (ref 80.0–100.0)
Platelets: 208 10*3/uL (ref 150–400)
RBC: 4.39 MIL/uL (ref 4.22–5.81)
RDW: 15.2 % (ref 11.5–15.5)
WBC: 14.9 10*3/uL — ABNORMAL HIGH (ref 4.0–10.5)
nRBC: 0 % (ref 0.0–0.2)

## 2022-10-02 LAB — BLOOD GAS, ARTERIAL
Acid-base deficit: 1.5 mmol/L (ref 0.0–2.0)
Bicarbonate: 22.9 mmol/L (ref 20.0–28.0)
O2 Saturation: 85.1 %
Patient temperature: 36.3
pCO2 arterial: 36 mmHg (ref 32–48)
pH, Arterial: 7.41 (ref 7.35–7.45)
pO2, Arterial: 51 mmHg — ABNORMAL LOW (ref 83–108)

## 2022-10-02 LAB — BASIC METABOLIC PANEL
Anion gap: 11 (ref 5–15)
BUN: 18 mg/dL (ref 8–23)
CO2: 20 mmol/L — ABNORMAL LOW (ref 22–32)
Calcium: 9.2 mg/dL (ref 8.9–10.3)
Chloride: 106 mmol/L (ref 98–111)
Creatinine, Ser: 1.24 mg/dL (ref 0.61–1.24)
GFR, Estimated: 55 mL/min — ABNORMAL LOW (ref >60)
Glucose, Bld: 116 mg/dL — ABNORMAL HIGH (ref 70–99)
Potassium: 3.5 mmol/L (ref 3.5–5.1)
Sodium: 137 mmol/L (ref 135–145)

## 2022-10-02 LAB — GLUCOSE, CAPILLARY
Glucose-Capillary: 142 mg/dL — ABNORMAL HIGH (ref 70–99)
Glucose-Capillary: 123 mg/dL — ABNORMAL HIGH (ref 70–99)
Glucose-Capillary: 128 mg/dL — ABNORMAL HIGH (ref 70–99)
Glucose-Capillary: 160 mg/dL — ABNORMAL HIGH (ref 70–99)
Glucose-Capillary: 147 mg/dL — ABNORMAL HIGH (ref 70–99)

## 2022-10-02 LAB — D-DIMER, QUANTITATIVE: D-Dimer, Quant: >20 ug{FEU}/mL — ABNORMAL HIGH (ref 0.00–0.50)

## 2022-10-02 LAB — STREP PNEUMONIAE URINARY ANTIGEN: Strep Pneumo Urinary Antigen: NEGATIVE

## 2022-10-02 LAB — MRSA NEXT GEN BY PCR, NASAL: MRSA by PCR Next Gen: NOT DETECTED

## 2022-10-02 MED ORDER — IOHEXOL 9 MG/ML PO SOLN: 500.0000 mL | ORAL | Status: AC

## 2022-10-02 MED ORDER — FUROSEMIDE 10 MG/ML IJ SOLN
40.0000 mg | Freq: Every day | INTRAMUSCULAR | Status: DC
  Administered 2022-10-03: 40 mg via INTRAVENOUS
  Filled 2022-10-02: qty 4

## 2022-10-02 MED ORDER — METHYLPREDNISOLONE SODIUM SUCC 40 MG IJ SOLR
40.0000 mg | Freq: Two times a day (BID) | INTRAMUSCULAR | Status: DC
  Administered 2022-10-02 – 2022-10-04 (×4): 40 mg via INTRAVENOUS
  Filled 2022-10-02 (×4): qty 1

## 2022-10-02 MED ORDER — METRONIDAZOLE 500 MG/100ML IV SOLN
500.0000 mg | Freq: Two times a day (BID) | INTRAVENOUS | Status: DC
  Administered 2022-10-02 – 2022-10-04 (×3): 500 mg via INTRAVENOUS
  Filled 2022-10-02 (×4): qty 100

## 2022-10-02 MED ORDER — BUDESONIDE 0.25 MG/2ML IN SUSP
0.2500 mg | Freq: Two times a day (BID) | RESPIRATORY_TRACT | Status: DC
  Administered 2022-10-02 – 2022-10-04 (×4): 0.25 mg via RESPIRATORY_TRACT
  Filled 2022-10-02 (×5): qty 2

## 2022-10-02 MED ORDER — FUROSEMIDE 10 MG/ML IJ SOLN
40.0000 mg | Freq: Once | INTRAMUSCULAR | Status: AC
  Administered 2022-10-02: 40 mg via INTRAVENOUS
  Filled 2022-10-02: qty 4

## 2022-10-02 MED ORDER — IPRATROPIUM-ALBUTEROL 0.5-2.5 (3) MG/3ML IN SOLN
3.0000 mL | Freq: Four times a day (QID) | RESPIRATORY_TRACT | Status: DC
  Administered 2022-10-02 – 2022-10-04 (×5): 3 mL via RESPIRATORY_TRACT
  Filled 2022-10-02 (×6): qty 3

## 2022-10-02 NOTE — Evaluation (Signed)
Physical Therapy Evaluation Patient Details Name: Lance Powell MRN: [id number] DOB: [birth year] Today's Date: 10/02/2022  History of Present Illness  Pt is a 89 y.o. male presenting 10/01/2022 from [facility] after staff found him to be hypoxic (84% SpO2) on his normal 4L O2. Admitted for PNA. significant of hypertension, hyperlipidemia, COPD on 2-3 L of oxygen at baseline, DM type II,  arthritis, and sleep apnea  Clinical Impression  Patient presents with decreased mobility due to pain, limited activity tolerance, decreased cardiorespiratory endurance, decreased cognition, decreased balance and decreased strength.  He will benefit from skilled PT in the acute setting and will need inpatient rehab <3 hours/day at d/c.       If plan is discharge home, recommend the following: Two people to help with walking and/or transfers;Assistance with cooking/housework;A lot of help with bathing/dressing/bathroom;Assist for transportation;Help with stairs or ramp for entrance   Can travel by private vehicle   No    Equipment Recommendations None recommended by PT  Recommendations for Other Services       Functional Status Assessment Patient has had a recent decline in their functional status and demonstrates the ability to make significant improvements in function in a reasonable and predictable amount of time.     Precautions / Restrictions Precautions Precautions: Fall Precaution Comments: Pt RN ordered L shoulder sling for L clavicular Fx      Mobility  Bed Mobility Overal bed mobility: Needs Assistance Bed Mobility: Supine to Sit     Supine to sit: Mod assist, HOB elevated     General bed mobility comments: assist for lifting trunk with HOB up and pt able to scoot to EOB    Transfers Overall transfer level: Needs assistance Equipment used: None, 1 person hand held assist Transfers: Sit to/from Stand, Bed to chair/wheelchair/BSC Sit to Stand: Min assist   Step  pivot transfers: Min assist       General transfer comment: assist for balance, to initiate anterior weight shift and for positioning once in chair    Ambulation/Gait                  Stairs            Wheelchair Mobility     Tilt Bed    Modified Rankin (Stroke Patients Only)       Balance Overall balance assessment: Needs assistance   Sitting balance-Leahy Scale: Fair     Standing balance support: During functional activity, Single extremity supported Standing balance-Leahy Scale: Poor Standing balance comment: holding on to PT in standing                             Pertinent Vitals/Pain Pain Assessment Pain Assessment: Faces Faces Pain Scale: Hurts even more Pain Location: shoulders Pain Descriptors / Indicators: Aching, Sore Pain Intervention(s): Monitored during session, Repositioned, Limited activity within patient's tolerance    Home Living Family/patient expects to be discharged to:: Assisted living [facility])                 Home Equipment: Rollator (4 wheels);Wheelchair - manual Additional Comments: on 2-3 LPM at baseline    Prior Function Prior Level of Function : Needs assist             Mobility Comments: Pt reports recent use of wheelchair and daughter confirms on phone. Prior to clavicle fx, was getting to wheelchair with little assist, but daughter reports mostly in  bed since clavicle fx ADLs Comments: Per pt, normally able to do most dressing tasks and self feeding     Extremity/Trunk Assessment   Upper Extremity Assessment Upper Extremity Assessment: Defer to OT evaluation RUE Deficits / Details: Pt would not actively lift RUE, and began to call out with PROM past ~20 degrees at the shoulder. AAROM elbow WFL but pt not lifting more than ~100 degrees against gravity without assist. LUE Deficits / Details: L clavicle fx so shoulder ROM not formally assessed. Pt with signficiant bruising and poor tolerance  of being touched on any portion of shoulder. Pt reluctant to use LUE at all. Elbow AAROM WFL but was observed with profound weakness during self feeding with BUE requiring min A to self feed with bil hands.    Lower Extremity Assessment Lower Extremity Assessment: Generalized weakness    Cervical / Trunk Assessment Cervical / Trunk Assessment: Kyphotic  Communication   Communication Communication: Hearing impairment Cueing Techniques: Verbal cues;Tactile cues;Gestural cues  Cognition Arousal: Alert Behavior During Therapy: Anxious Overall Cognitive Status: Impaired/Different from baseline Area of Impairment: Attention, Memory, Following commands, Problem solving, Awareness, Orientation                 Orientation Level: Disoriented to, Situation, Place Current Attention Level: Sustained Memory: Decreased short-term memory Following Commands: Follows one step commands with increased time     Problem Solving: Slow processing, Difficulty sequencing, Decreased initiation, Requires verbal cues, Requires tactile cues          General Comments General comments (skin integrity, edema, etc.): daughter arrived during session; pt with wet bed so assisted to remove soiled linen; SpO2 fluctuating 65-89% during session so increased O2 to 5LPM and pt cued for PLB with RN in the room to assess and change probe site to earlobe; set up meal for pt and daughter assisting with feeding; pt able to bring cup to mouth with R UE; coughed x 2 during session productive of grey sputum    Exercises     Assessment/Plan    PT Assessment Patient needs continued PT services  PT Problem List Decreased strength;Decreased balance;Decreased cognition;Pain;Decreased mobility;Decreased activity tolerance;Decreased range of motion;Decreased safety awareness;Decreased knowledge of use of DME       PT Treatment Interventions DME instruction;Functional mobility training;Balance training;Patient/family  education;Therapeutic activities;Therapeutic exercise;Cognitive remediation;Wheelchair mobility training    PT Goals (Current goals can be found in the Care Plan section)  Acute Rehab PT Goals Patient Stated Goal: daughter hopes he can stay 3 days to get to rehab PT Goal Formulation: With patient/family Time For Goal Achievement: 10/16/22 Potential to Achieve Goals: Fair    Frequency Min 1X/week     Co-evaluation               AM-PAC PT "6 Clicks" Mobility  Outcome Measure Help needed turning from your back to your side while in a flat bed without using bedrails?: A Lot Help needed moving from lying on your back to sitting on the side of a flat bed without using bedrails?: A Lot Help needed moving to and from a bed to a chair (including a wheelchair)?: A Lot Help needed standing up from a chair using your arms (e.g., wheelchair or bedside chair)?: A Lot Help needed to walk in hospital room?: Total Help needed climbing 3-5 steps with a railing? : Total 6 Click Score: 10    End of Session Equipment Utilized During Treatment: Gait belt;Oxygen Activity Tolerance: Patient limited by fatigue;Patient limited by pain  Patient left: in chair;with call bell/phone within reach;with family/visitor present;with chair alarm set   PT Visit Diagnosis: Muscle weakness (generalized) (M62.81);Other abnormalities of gait and mobility (R26.89);Pain Pain - Right/Left:  (both) Pain - part of body: Shoulder    Time: 1440-1515 PT Time Calculation (min) (ACUTE ONLY): 35 min   Charges:   PT Evaluation $PT Eval Moderate Complexity: 1 Mod PT Treatments $Therapeutic Activity: 8-22 mins PT General Charges $$ ACUTE PT VISIT: 1 Visit         Sheran Lawless, PT Acute Rehabilitation Services [phone] 10/02/2022   Elray Mcgregor 10/02/2022, 3:44 PM

## 2022-10-02 NOTE — Progress Notes (Signed)
This RN gave report to 6E.

## 2022-10-02 NOTE — Progress Notes (Signed)
Pt. Already placed on cpap. Currently tolerating well.

## 2022-10-02 NOTE — Progress Notes (Signed)
PROGRESS NOTE  Lance Powell:[id number] DOB: 12/19/1929 DOA: 10/01/2022 PCP: Esperanza Richters, PA-C  HPI/Recap of past 24 hours: Lance Powell is a 89 y.o. male with medical history significant of HTN, HLD, COPD on 2-3 L of oxygen at baseline, DM type 2, arthritis, and OSA who presented with complaints of weakness, N/V/D with encephalopathy. He had just recently been hospitalized from 8/26-8/28 at [facility] due to worsening cough after initially being diagnosed with COVID-19 on 8/12.  Patient did not meet criteria for Paxlovid or remdesivir.  He was noted to have COPD exacerbation treated with steroids, AKI due to the diarrhea treated with IV fluids. At the facility they had noted his O2 saturations to be as low as 81% on his normal 2 L of oxygen for which they sent him to the hospital for further evaluation. In the ED, patient was noted to be afebrile, pulse 100-104, respirations 19-27, and O2 saturations currently maintained on 2 L of nasal cannula oxygen.  Labs showed WBC 22.1, glucose 153, and BNP 158.9.  Chest x-ray noted hyperinflation with chronic changes and increased left retrocardiac opacity concerning for possible infiltrate.  Patient admitted for further management.     Today, pt currently alert, awake, with some noted confusion.  Patient continues to desaturate requiring about 5 L of O2.  Still unable to tolerate orally.  Patient denies any chest pain, abdominal pain, fever/chills.    Assessment/Plan: Principal Problem:   Sepsis due to pneumonia [facility]) Active Problems:   Acute metabolic encephalopathy   Nausea vomiting and diarrhea   COPD (chronic obstructive pulmonary disease) (HCC)   Fracture of unspecified part of left clavicle, subsequent encounter for fracture with routine healing   History of COVID-19   Diabetes mellitus type II, controlled (HCC)   Hiatal hernia   Obstructive sleep apnea   Hyperlipidemia   Physical  deconditioning    Sepsis likely 2/2 HCAP Vs aspiration pneumonia On admission, noted to be tachypneic with leukocytosis Currently requiring about 5 L of O2  CXR concerning for a left retrocardiac opacity CT scan of chest, abdomen, pelvis pending UA unremarkable for infection BC X 2 pending Procalcitonin currently negative Urine legionella, and urine strep pending Continue vancomycin, cefepime and Flagyl, de-escalate when medically appropriate Continue Supplemental O2  Acute on chronic respiratory failure with hypoxia Possible COPD exacerbation R/O acute CHF Currently requiring about 5 L of O2 BNP mildly elevated at 158, prior in 04/2022 was 23 Chest x-ray noted with hyperinflation Echo pending ABG pending D-dimer pending Gave 1 dose of IV 40 mg Lasix due to congestion, patient diuresed about 600 cc shortly afterwards, will continue Lasix Continue nebulizers, DuoNeb Start IV Solu-Medrol for now Strict I's and O's, daily weight Continue O2  Nausea, vomiting Resolving Patient has prior history of partial small bowel obstruction back in 2016 No BM so far today CT abdomen/pelvis pending Antiemetics as needed Held misoprostol in case this is leading to the patient's diarrhea  Acute metabolic encephalopathy Multifactorial due to above Currently on alert, awake, intermittently disoriented Patient reportedly admitted altered and was speaking to family members who had passed.  At baseline daughter reports patient is alert and oriented x 3.  Question of secondary to acute infection. Neurochecks Management as above  Left clavicle fracture secondary to fall Prior to arrival.  Left clavicle fracture sustained after fall on 9/6. Continue sling   History of COVID-19 infection Patient had previously been diagnosed with COVID-19 on 8/22 at  his facility.   Diabetes mellitus type 2, without long-term use of insulin SSI, Accu-Cheks, hypoglycemic protocol Hold metformin and  linagliptin   Hiatal hernia Chronic.  Patient has a midline hiatal hernia that is reducible.  Bowel sounds present.   Hyperlipidemia Continue atorvastatin   Deconditioning PT to evaluate and treat   OSA on CPAP Continue CPAP at night    Estimated body mass index is 29.33 kg/m as calculated from the following:   Height as of 05/14/22: 6' (1.829 m).   Weight as of this encounter: 98.1 kg.     Code Status: DNR  Family Communication: None at bedside  Disposition Plan: Status is: Inpatient Remains inpatient appropriate because: Level of care      Consultants: None  Procedures: None  Antimicrobials: Vancomycin Cefepime Flagyl  DVT prophylaxis:  Lovenox   Objective: Vitals:   10/02/22 1526 10/02/22 1541 10/02/22 1552 10/02/22 1555  BP:      Pulse:      Resp:      Temp:      TempSrc:      SpO2: 92% 92% 90% (!) 88%  Weight:        Intake/Output Summary (Last 24 hours) at 10/02/2022 1613 Last data filed at 10/02/2022 1552 Gross per 24 hour  Intake 855.09 ml  Output 600 ml  Net 255.09 ml   Filed Weights   10/01/22 1400 10/02/22 0503  Weight: 106.6 kg 98.1 kg    Exam: General: NAD, lethargic Cardiovascular: S1, S2 present Respiratory: Diminished breath sounds bilaterally, congested Abdomen: Soft, nontender, nondistended, bowel sounds present, hernia noted Musculoskeletal: No bilateral pedal edema noted Skin: Normal Psychiatry: Intermittently confused, fair mood     Data Reviewed: CBC: Recent Labs  Lab 10/01/22 1121 10/02/22 0840  WBC 22.1* 14.9*  NEUTROABS 18.7*  --   HGB 14.8 13.4  HCT 47.1 42.2  MCV 95.5 96.1  PLT 229 208   Basic Metabolic Panel: Recent Labs  Lab 10/01/22 1121 10/01/22 1736 10/02/22 0840  NA 138  --  137  K 4.0  --  3.5  CL 108  --  106  CO2 20*  --  20*  GLUCOSE 153*  --  116*  BUN 19  --  18  CREATININE 1.23  --  1.24  CALCIUM 9.7  --  9.2  MG  --  1.5*  --    GFR: Estimated Creatinine Clearance:  47.1 mL/min (by C-G formula based on SCr of 1.24 mg/dL). Liver Function Tests: Recent Labs  Lab 10/01/22 1121  AST 16  ALT 17  ALKPHOS 72  BILITOT 1.0  PROT 7.0  ALBUMIN 3.1*   No results for input(s): "LIPASE", "AMYLASE" in the last 168 hours. No results for input(s): "AMMONIA" in the last 168 hours. Coagulation Profile: No results for input(s): "INR", "PROTIME" in the last 168 hours. Cardiac Enzymes: No results for input(s): "CKTOTAL", "CKMB", "CKMBINDEX", "TROPONINI" in the last 168 hours. BNP (last 3 results) Recent Labs    12/05/21 1410  PROBNP 26.0   HbA1C: No results for input(s): "HGBA1C" in the last 72 hours. CBG: Recent Labs  Lab 10/02/22 0455 10/02/22 0808 10/02/22 1211  GLUCAP 142* 123* 128*   Lipid Profile: No results for input(s): "CHOL", "HDL", "LDLCALC", "TRIG", "CHOLHDL", "LDLDIRECT" in the last 72 hours. Thyroid Function Tests: No results for input(s): "TSH", "T4TOTAL", "FREET4", "T3FREE", "THYROIDAB" in the last 72 hours. Anemia Panel: No results for input(s): "VITAMINB12", "FOLATE", "FERRITIN", "TIBC", "IRON", "RETICCTPCT" in the last 72 hours.  Urine analysis:    Component Value Date/Time   COLORURINE YELLOW 10/02/2022 1427   APPEARANCEUR CLEAR 10/02/2022 1427   LABSPEC 1.016 10/02/2022 1427   PHURINE 5.0 10/02/2022 1427   GLUCOSEU NEGATIVE 10/02/2022 1427   HGBUR NEGATIVE 10/02/2022 1427   BILIRUBINUR NEGATIVE 10/02/2022 1427   BILIRUBINUR negative 06/22/2019 1703   KETONESUR NEGATIVE 10/02/2022 1427   PROTEINUR NEGATIVE 10/02/2022 1427   UROBILINOGEN 0.2 06/22/2019 1703   UROBILINOGEN 0.2 02/25/2014 1633   NITRITE NEGATIVE 10/02/2022 1427   LEUKOCYTESUR NEGATIVE 10/02/2022 1427   Sepsis Labs: @LABRCNTIP (procalcitonin:4,lacticidven:4)  ) Recent Results (from the past 240 hour(s))  MRSA Next Gen by PCR, Nasal     Status: None   Collection Time: 10/02/22 12:11 PM  Result Value Ref Range Status   MRSA by PCR Next Gen NOT DETECTED NOT  DETECTED Final    Comment: (NOTE) The GeneXpert MRSA Assay (FDA approved for NASAL specimens only), is one component of a comprehensive MRSA colonization surveillance program. It is not intended to diagnose MRSA infection nor to guide or monitor treatment for MRSA infections. Test performance is not FDA approved in patients less than 27 years old. Performed at [facility], [address] [place], [place] [address]       Studies: No results found.  Scheduled Meds:  arformoterol  15 mcg Nebulization BID   And   umeclidinium bromide  1 puff Inhalation Daily   atorvastatin  40 mg Oral QHS   enoxaparin (LOVENOX) injection  40 mg Subcutaneous Q24H   guaiFENesin  600 mg Oral BID   insulin aspart  0-6 Units Subcutaneous TID WC   melatonin  5 mg Oral QPM   polyvinyl alcohol  1 drop Both Eyes TID   sodium chloride flush  3 mL Intravenous Q12H   venlafaxine XR  75 mg Oral Daily    Continuous Infusions:  sodium chloride 75 mL/hr at 10/02/22 1046   ceFEPime (MAXIPIME) IV Stopped (10/02/22 4332)   vancomycin       LOS: 1 day     Briant Cedar, MD [facility]  If 7PM-7AM, please contact night-coverage [email or web address] 10/02/2022, 4:13 PM

## 2022-10-02 NOTE — Progress Notes (Signed)
Patient noted to be requiring more oxygen, currently on about 10 L of O2.  Discussed extensively with daughter who stated patient would not want any aggressive intervention.  Daughter stated that for a while now, patient has been talking about death and seeing dead relatives.  Daughter wants to discuss with palliative/hospice team.  Consult placed.  Will transfer patient to the progressive floor due to patient requiring about 10 L of O2 and monitor closely.  Daughter will discuss with other family members about patient's current condition.

## 2022-10-02 NOTE — Plan of Care (Signed)

## 2022-10-02 NOTE — NC FL2 (Signed)
[place] MEDICAID FL2 LEVEL OF CARE FORM     IDENTIFICATION  Patient Name: Lance Powell Birthdate: Feb 16, 1929 Sex: male Admission Date (Current Location): 10/01/2022  [facility] and [place] Number:  Producer, television/film/video and Address:  [facility] [place], [address] [place], [place] [address]      Provider Number: [id number]  Attending Physician Name and Address:  Briant Cedar, MD  Relative Name and Phone Number:  Angus Palms (Daughter)  [phone]    Current Level of Care: Hospital Recommended Level of Care: Skilled Nursing Facility Prior Approval Number:    Date Approved/Denied:   PASRR Number: [id number]  Discharge Plan: SNF    Current Diagnoses: Patient Active Problem List   Diagnosis Date Noted   Sepsis due to pneumonia (HCC) 10/01/2022   Acute metabolic encephalopathy 10/01/2022   Nausea vomiting and diarrhea 10/01/2022   History of COVID-19 10/01/2022   Fracture of unspecified part of left clavicle, subsequent encounter for fracture with routine healing 10/01/2022   Hiatal hernia 10/01/2022   Chronic diarrhea 07/11/2021   Dementia (HCC) 03/14/2021   Accident due to mechanical fall without injury 06/11/2019   Sprain of medial collateral ligament of right knee 04/08/2019   Prepatellar bursitis of right knee 04/08/2019   Chronic respiratory failure (HCC) 11/06/2017   OA (osteoarthritis) of hip 07/27/2014   Abrasion 07/27/2014   Insomnia 06/27/2014   Chronic cholecystitis 06/16/2014   Coccyx pain 03/30/2014   Diabetes mellitus type II, controlled (HCC) 03/08/2014   Depression 03/08/2014   GERD (gastroesophageal reflux disease) 03/08/2014   Hyperlipidemia 03/08/2014   Abdominal pain    Diverticulitis of large intestine without perforation or abscess without bleeding    Blood poisoning    Calculus of gallbladder with acute cholecystitis 02/27/2014   Acute cholecystitis    Diverticulitis 02/25/2014   Urinary retention  02/25/2014   Hypertension 02/25/2014   SBO (small bowel obstruction) (HCC) 02/25/2014   Sepsis (HCC) 02/25/2014   Cholecystitis 02/25/2014   Slow transit constipation 02/25/2014   Seborrheic dermatitis of scalp 11/24/2013   Diarrhea 07/12/2013   Contusion of multiple sites 05/24/2013   ED (erectile dysfunction) of organic origin 05/06/2013   Seborrheic keratosis 05/06/2013   Cough 02/17/2013   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   Acute maxillary sinusitis 10/30/2012   Allergic rhinitis 06/27/2012   Hypertrophic and atrophic condition of skin 06/27/2012   Increased frequency of urination 06/27/2012   Lower urinary tract infectious disease 06/27/2012   Major depressive disorder, single episode, unspecified 06/27/2012   Physical deconditioning 06/27/2012   Psychosexual dysfunction with inhibited sexual excitement 06/27/2012   Personal history of colonic polyps 01/06/2012   Special screening for malignant neoplasms, colon 11/15/2011   Pruritus ani 11/15/2011   COPD (chronic obstructive pulmonary disease) (HCC) 01/11/2011   DOE (dyspnea on exertion) 01/01/2011   Fissure in ano 12/21/2010   HYPERLIPIDEMIA 01/11/2009   Obstructive sleep apnea 01/11/2009   ARTHRITIS, RHEUMATOID 01/11/2009   OSTEOARTHRITIS 01/11/2009    Orientation RESPIRATION BLADDER Height & Weight     Self, Place  O2 (5L, wears CPAP at night) Incontinent Weight: 216 lb 4.3 oz (98.1 kg) Height:     BEHAVIORAL SYMPTOMS/MOOD NEUROLOGICAL BOWEL NUTRITION STATUS      Continent Diet (see discharge summary)  AMBULATORY STATUS COMMUNICATION OF NEEDS Skin   Extensive Assist Verbally (rambles) Normal                       Personal Care  Assistance Level of Assistance  Bathing, Feeding, Dressing Bathing Assistance: Maximum assistance Feeding assistance: Limited assistance Dressing Assistance: Maximum assistance     Functional Limitations Info  Sight, Hearing, Speech Sight Info: Impaired (wears glasses) Hearing Info:  Adequate Speech Info: Adequate    SPECIAL CARE FACTORS FREQUENCY  PT (By licensed PT), OT (By licensed OT)     PT Frequency: 5x/week OT Frequency: 5x/week            Contractures Contractures Info: Not present    Additional Factors Info  Code Status, Allergies, Insulin Sliding Scale (Enteric precautions (UV disinfection)) Code Status Info: DNR-INTERVENE Allergies Info: No Known Allergies   Insulin Sliding Scale Info: see discharge summary       Current Medications (10/02/2022):  This is the current hospital active medication list Current Facility-Administered Medications  Medication Dose Route Frequency Provider Last Rate Last Admin   0.9 %  sodium chloride infusion   Intravenous Continuous Madelyn Flavors A, MD 75 mL/hr at 10/02/22 1046 New Bag at 10/02/22 1046   acetaminophen (TYLENOL) tablet 650 mg  650 mg Oral Q6H PRN Madelyn Flavors A, MD   650 mg at 10/02/22 3086   Or   acetaminophen (TYLENOL) suppository 650 mg  650 mg Rectal Q6H PRN Madelyn Flavors A, MD       albuterol (PROVENTIL) (2.5 MG/3ML) 0.083% nebulizer solution 2.5 mg  2.5 mg Nebulization Q2H PRN Katrinka Blazing, Rondell A, MD       arformoterol (BROVANA) nebulizer solution 15 mcg  15 mcg Nebulization BID Madelyn Flavors A, MD   15 mcg at 10/02/22 5784   And   umeclidinium bromide (INCRUSE ELLIPTA) 62.5 MCG/ACT 1 puff  1 puff Inhalation Daily Madelyn Flavors A, MD   1 puff at 10/02/22 0923   atorvastatin (LIPITOR) tablet 40 mg  40 mg Oral QHS Smith, Rondell A, MD   40 mg at 10/01/22 2123   ceFEPIme (MAXIPIME) 2 g in sodium chloride 0.9 % 100 mL IVPB  2 g Intravenous Q12H Madelyn Flavors A, MD   Stopped at 10/02/22 0907   enoxaparin (LOVENOX) injection 40 mg  40 mg Subcutaneous Q24H Smith, Rondell A, MD   40 mg at 10/01/22 1810   guaiFENesin (MUCINEX) 12 hr tablet 600 mg  600 mg Oral BID Madelyn Flavors A, MD   600 mg at 10/02/22 0832   insulin aspart (novoLOG) injection 0-6 Units  0-6 Units Subcutaneous TID WC Smith, Rondell  A, MD   1 Units at 10/02/22 1212   melatonin tablet 5 mg  5 mg Oral QPM Smith, Rondell A, MD   5 mg at 10/01/22 2105   oxyCODONE (Oxy IR/ROXICODONE) immediate release tablet 5 mg  5 mg Oral Q6H PRN Madelyn Flavors A, MD   5 mg at 10/02/22 6962   polyvinyl alcohol (LIQUIFILM TEARS) 1.4 % ophthalmic solution 1 drop  1 drop Both Eyes TID Madelyn Flavors A, MD   1 drop at 10/02/22 0839   sodium chloride flush (NS) 0.9 % injection 3 mL  3 mL Intravenous Q12H Smith, Rondell A, MD   3 mL at 10/02/22 0839   vancomycin (VANCOCIN) IVPB 1000 mg/200 mL premix  1,000 mg Intravenous Q24H Pham, Minh Q, RPH-CPP       venlafaxine XR (EFFEXOR-XR) 24 hr capsule 75 mg  75 mg Oral Daily Katrinka Blazing, Rondell A, MD   75 mg at 10/02/22 9528     Discharge Medications: Please see discharge summary for a list of discharge medications.  Relevant  Imaging Results:  Relevant Lab Results:   Additional Information [id number]  Savvas Roper A [place], LCSWA

## 2022-10-02 NOTE — Evaluation (Signed)
Occupational Therapy Evaluation Patient Details Name: Lance Powell MRN: [id number] DOB: 10/12/1929 Today's Date: 10/02/2022   History of Present Illness Pt is a 89 y.o. male presenting 10/01/2022 from [facility] after staff found him to be hypoxic (84% SpO2) on his normal 4L O2. Admitted for PNA. significant of hypertension, hyperlipidemia, COPD on 2-3 L of oxygen at baseline, DM type II,  arthritis, and sleep apnea   Clinical Impression   PTA, pt lived at ALF and received intermittent assist for ADL.  Upon eval, pt with significant weakness, decreased activity tolerance, and decreased cardiopulmonary status requiring 5L O2 via [place] and HR 100 at rest on arrival. Pt requiring max A for self feeding, grooming, and UB ADL and total A for LB ADL. Pt with some reluctance to use BUE during session but willing to self feed with constant light assist for bringing drink/fork to mouth and assist for loading fork. Pt daughter reports pt previously receiving only light assist for BADL and was able to feed self without help. Will continue to follow. Patient will benefit from continued inpatient follow up therapy, <3 hours/day       If plan is discharge home, recommend the following: Two people to help with walking and/or transfers;Two people to help with bathing/dressing/bathroom;Assistance with cooking/housework;Assistance with feeding;Direct supervision/assist for medications management;Assist for transportation;Direct supervision/assist for financial management;Help with stairs or ramp for entrance    Functional Status Assessment  Patient has had a recent decline in their functional status and/or demonstrates limited ability to make significant improvements in function in a reasonable and predictable amount of time  Equipment Recommendations  Other (comment) (defer)    Recommendations for Other Services PT consult     Precautions / Restrictions Precautions Precautions: Other  (comment) Precaution Comments: Pt RN ordered L shoulder sling for L clavicular Fx      Mobility Bed Mobility Overal bed mobility: Needs Assistance Bed Mobility: Supine to Sit, Sit to Supine     Supine to sit: Max assist Sit to supine: Min assist   General bed mobility comments: Max A due to very poor initiation of truncal elevation. Pt able to bring BLE back into bed for return to spine with min A for trunk support. Shoulder pain limiting pt willingness to get to EOB and pt immediately self initiating return to supine when OT attempting to facilitate truncal elevation with both HOB flat and HOB fully elevated.    Transfers                   General transfer comment: deferred      Balance Overall balance assessment: Needs assistance                                         ADL either performed or assessed with clinical judgement   ADL Overall ADL's : Needs assistance/impaired Eating/Feeding: Maximal assistance;Bed level Eating/Feeding Details (indicate cue type and reason): Constant light assist. Assist for loading fork, grasp, and bringing to mouth; very reluctant to perform. Assist with bringing cup to mouth with straw as well but greater active participation with gross grasp on cup. Continues not to actively flex elbow above ~100 degrees without assist. Grooming: Maximal assistance   Upper Body Bathing: Maximal assistance   Lower Body Bathing: Total assistance   Upper Body Dressing : Maximal assistance   Lower Body Dressing: Total assistance  Toilet Transfer Details (indicate cue type and reason): NT           General ADL Comments: Evaluation limited due to pt self limiting and with pain     Vision Patient Visual Report: No change from baseline Additional Comments: Pt denies visual changes.     Perception         Praxis         Pertinent Vitals/Pain Pain Assessment Pain Assessment: Faces Faces Pain Scale: Hurts whole  lot Pain Location: shoulders Pain Descriptors / Indicators: Aching, Sore Pain Intervention(s): Limited activity within patient's tolerance, Monitored during session     Extremity/Trunk Assessment Upper Extremity Assessment Upper Extremity Assessment: Generalized weakness;RUE deficits/detail;LUE deficits/detail RUE Deficits / Details: Pt would not actively lift RUE, and began to call out with PROM past ~20 degrees at the shoulder. AAROM elbow WFL but pt not lifting more than ~100 degrees against gravity without assist. LUE Deficits / Details: L clavicle fx so shoulder ROM not formally assessed. Pt with signficiant bruising and poor tolerance of being touched on any portion of shoulder. Pt reluctant to use LUE at all. Elbow AAROM WFL but was observed with profound weakness during self feeding with BUE requiring min A to self feed with bil hands.   Lower Extremity Assessment Lower Extremity Assessment: Defer to PT evaluation       Communication Communication Communication: Hearing impairment (HOH) Cueing Techniques: Verbal cues;Tactile cues;Gestural cues   Cognition Arousal: Alert Behavior During Therapy: WFL for tasks assessed/performed Overall Cognitive Status: Impaired/Different from baseline Area of Impairment: Attention, Memory, Following commands, Problem solving, Awareness                   Current Attention Level: Sustained (for short bouts) Memory: Decreased short-term memory Following Commands: Follows one step commands with increased time   Awareness: Intellectual Problem Solving: Slow processing, Difficulty sequencing, Decreased initiation, Requires verbal cues, Requires tactile cues General Comments: Pt needing cues for mobility, but self-limiting due to pain. Oriented to month, day, and year; aware it is his birthday. poor overall awareness, reporting he cannot use arms for anything due to pt clavicle fxs, requiring max encouragement.     General Comments  spoke  with daughter on phone who reports this is a significant change in status and with concerns about pt ability to care for self in ways he did previously. Pt on 5L O2 during session with SpO2 at 90    Exercises     Shoulder Instructions      Home Living Family/patient expects to be discharged to:: Assisted living [facility])                             Home Equipment: Rollator (4 wheels);Wheelchair - manual   Additional Comments: on 2-3 LPM at baseline      Prior Functioning/Environment Prior Level of Function : Needs assist             Mobility Comments: Pt reports recent use of wheelchair and daughter confirms on phone. Prior to clavicle fx, was getting to wheelchair with little assist, but daughter reports mostly in bed since clavicle fx ADLs Comments: Per pt, normally able to do most dressing tasks and self feeding        OT Problem List: Decreased strength;Decreased activity tolerance;Impaired balance (sitting and/or standing);Impaired vision/perception      OT Treatment/Interventions: Self-care/ADL training;Therapeutic exercise;DME and/or AE instruction;Balance training;Patient/family education;Therapeutic activities;Cognitive remediation/compensation  OT Goals(Current goals can be found in the care plan section) Acute Rehab OT Goals Patient Stated Goal: go home OT Goal Formulation: With patient Time For Goal Achievement: 10/16/22 Potential to Achieve Goals: Good  OT Frequency: Min 1X/week    Co-evaluation              AM-PAC OT "6 Clicks" Daily Activity     Outcome Measure Help from another person eating meals?: A Lot Help from another person taking care of personal grooming?: A Lot Help from another person toileting, which includes using toliet, bedpan, or urinal?: Total Help from another person bathing (including washing, rinsing, drying)?: A Lot Help from another person to put on and taking off regular upper body clothing?: A Lot Help from  another person to put on and taking off regular lower body clothing?: Total 6 Click Score: 10   End of Session Nurse Communication: Mobility status  Activity Tolerance: Patient tolerated treatment well Patient left: in bed;with call bell/phone within reach;with bed alarm set  OT Visit Diagnosis: Unsteadiness on feet (R26.81);Muscle weakness (generalized) (M62.81);Other symptoms and signs involving cognitive function                Time: 1107-1150 OT Time Calculation (min): 43 min Charges:  OT General Charges $OT Visit: 1 Visit OT Evaluation $OT Eval Moderate Complexity: 1 Mod OT Treatments $Self Care/Home Management : 8-22 mins $Therapeutic Activity: 8-22 mins  Myrla Halsted, OTD, OTR/L [facility] Acute Rehabilitation Office: [phone]   Myrla Halsted 10/02/2022, 1:24 PM

## 2022-10-02 NOTE — Progress Notes (Signed)
Pt placed on CPAP by RT due to desat. Pt tolerating well, RN at bedside, no increased WOB noted, RT will monitor as needed.     10/02/22 1750  BiPAP/CPAP/SIPAP  $ Non-Invasive Ventilator  Non-Invasive Vent Subsequent  BiPAP/CPAP/SIPAP Pt Type Adult  BiPAP/CPAP/SIPAP Resmed  Mask Type Full face mask  Mask Size Large  Respiratory Rate 16 breaths/min  Flow Rate (S)  10 lpm  Patient Home Equipment No  Auto Titrate Yes  Press High Alarm 25 cmH2O  Press Low Alarm 5 cmH2O  BiPAP/CPAP /SiPAP Vitals  Pulse Rate 78  Resp 16  SpO2 (!) 89 %  Bilateral Breath Sounds Coarse crackles  MEWS Score/Color  MEWS Score 0  MEWS Score Color [place]

## 2022-10-03 ENCOUNTER — Inpatient Hospital Stay (HOSPITAL_COMMUNITY): Payer: Medicare Other

## 2022-10-03 DIAGNOSIS — J189 Pneumonia, unspecified organism: Secondary | ICD-10-CM | POA: Diagnosis not present

## 2022-10-03 DIAGNOSIS — R0609 Other forms of dyspnea: Secondary | ICD-10-CM

## 2022-10-03 DIAGNOSIS — A419 Sepsis, unspecified organism: Secondary | ICD-10-CM | POA: Diagnosis not present

## 2022-10-03 LAB — ECHOCARDIOGRAM COMPLETE
AR max vel: 0.81 cm2
AV Area VTI: 0.67 cm2
AV Area mean vel: 0.9 cm2
AV Mean grad: 20 mmHg
AV Peak grad: 35.5 mmHg
Ao pk vel: 2.98 m/s
Area-P 1/2: 6.17 cm2
MV VTI: 1.99 cm2
Weight: 3248.7 [oz_av]

## 2022-10-03 LAB — BASIC METABOLIC PANEL
Anion gap: 12 (ref 5–15)
BUN: 21 mg/dL (ref 8–23)
CO2: 23 mmol/L (ref 22–32)
Calcium: 9.2 mg/dL (ref 8.9–10.3)
Chloride: 101 mmol/L (ref 98–111)
Creatinine, Ser: 1.33 mg/dL — ABNORMAL HIGH (ref 0.61–1.24)
GFR, Estimated: 50 mL/min — ABNORMAL LOW (ref >60)
Glucose, Bld: 257 mg/dL — ABNORMAL HIGH (ref 70–99)
Potassium: 3.4 mmol/L — ABNORMAL LOW (ref 3.5–5.1)
Sodium: 136 mmol/L (ref 135–145)

## 2022-10-03 LAB — CBC WITH DIFFERENTIAL/PLATELET
Abs Immature Granulocytes: 0.1 10*3/uL — ABNORMAL HIGH (ref 0.00–0.07)
Basophils Absolute: 0 10*3/uL (ref 0.0–0.1)
Basophils Relative: 0 %
Eosinophils Absolute: 0 10*3/uL (ref 0.0–0.5)
Eosinophils Relative: 0 %
HCT: 40.2 % (ref 39.0–52.0)
Hemoglobin: 13.1 g/dL (ref 13.0–17.0)
Immature Granulocytes: 1 %
Lymphocytes Relative: 3 %
Lymphs Abs: 0.5 10*3/uL — ABNORMAL LOW (ref 0.7–4.0)
MCH: 30.1 pg (ref 26.0–34.0)
MCHC: 32.6 g/dL (ref 30.0–36.0)
MCV: 92.4 fL (ref 80.0–100.0)
Monocytes Absolute: 0.4 10*3/uL (ref 0.1–1.0)
Monocytes Relative: 2 %
Neutro Abs: 16.9 10*3/uL — ABNORMAL HIGH (ref 1.7–7.7)
Neutrophils Relative %: 94 %
Platelets: 202 10*3/uL (ref 150–400)
RBC: 4.35 MIL/uL (ref 4.22–5.81)
RDW: 14.8 % (ref 11.5–15.5)
WBC: 17.9 10*3/uL — ABNORMAL HIGH (ref 4.0–10.5)
nRBC: 0 % (ref 0.0–0.2)

## 2022-10-03 LAB — HEMOGLOBIN A1C
Hgb A1c MFr Bld: 6.3 % — ABNORMAL HIGH (ref 4.8–5.6)
Mean Plasma Glucose: 134 mg/dL

## 2022-10-03 LAB — GLUCOSE, CAPILLARY
Glucose-Capillary: 150 mg/dL — ABNORMAL HIGH (ref 70–99)
Glucose-Capillary: 258 mg/dL — ABNORMAL HIGH (ref 70–99)
Glucose-Capillary: 174 mg/dL — ABNORMAL HIGH (ref 70–99)
Glucose-Capillary: 229 mg/dL — ABNORMAL HIGH (ref 70–99)

## 2022-10-03 LAB — MAGNESIUM: Magnesium: 1.6 mg/dL — ABNORMAL LOW (ref 1.7–2.4)

## 2022-10-03 MED ORDER — MAGNESIUM SULFATE 2 GM/50ML IV SOLN
2.0000 g | Freq: Once | INTRAVENOUS | Status: AC
  Administered 2022-10-03: 2 g via INTRAVENOUS
  Filled 2022-10-03: qty 50

## 2022-10-03 MED ORDER — ACETYLCYSTEINE 20 % IN SOLN
3.0000 mL | Freq: Two times a day (BID) | RESPIRATORY_TRACT | Status: DC
  Administered 2022-10-03 (×2): 3 mL via RESPIRATORY_TRACT
  Filled 2022-10-03 (×3): qty 4

## 2022-10-03 MED ORDER — POTASSIUM CHLORIDE 10 MEQ/100ML IV SOLN: 10.0000 meq | INTRAVENOUS | Status: DC

## 2022-10-03 MED ORDER — IOHEXOL 350 MG/ML SOLN
75.0000 mL | Freq: Once | INTRAVENOUS | Status: AC | PRN
  Administered 2022-10-03: 75 mL via INTRAVENOUS

## 2022-10-03 MED ORDER — POTASSIUM CHLORIDE 10 MEQ/100ML IV SOLN
10.0000 meq | INTRAVENOUS | Status: AC
  Administered 2022-10-03 – 2022-10-04 (×2): 10 meq via INTRAVENOUS
  Filled 2022-10-03 (×2): qty 100

## 2022-10-03 NOTE — Progress Notes (Signed)
Echocardiogram 2D Echocardiogram has been performed.  Warren Lacy Brittyn Salaz RDCS 10/03/2022, 10:39 AM

## 2022-10-03 NOTE — Progress Notes (Signed)
   10/02/22 2347  Assess: MEWS Score  Temp 98 F (36.7 C)  BP 119/73  MAP (mmHg) 86  Pulse Rate 95  ECG Heart Rate 96  Resp (!) 27  SpO2 92 %  O2 Device CPAP  Assess: MEWS Score  MEWS Temp 0  MEWS Systolic 0  MEWS Pulse 0  MEWS RR 2  MEWS LOC 0  MEWS Score 2  MEWS Score Color Yellow  Assess: if the MEWS score is Yellow or Red  Were vital signs accurate and taken at a resting state? Yes  Does the patient meet 2 or more of the SIRS criteria? Yes  Does the patient have a confirmed or suspected source of infection? Yes  MEWS guidelines implemented  Yes, yellow  Treat  MEWS Interventions Considered administering scheduled or prn medications/treatments as ordered  Take Vital Signs  Increase Vital Sign Frequency  Yellow: Q2hr x1, continue Q4hrs until patient remains green for 12hrs  Escalate  MEWS: Escalate Yellow: Discuss with charge nurse and consider notifying provider and/or RRT  Notify: Charge Nurse/RN  Name of Charge Nurse/RN Notified Heather, RN  Assess: SIRS CRITERIA  SIRS Temperature  0  SIRS Pulse 1  SIRS Respirations  1  SIRS WBC 0  SIRS Score Sum  2

## 2022-10-03 NOTE — Progress Notes (Signed)
PROGRESS NOTE  Lance Powell ZOX:[id number] DOB: 1930-01-14 DOA: 10/01/2022 PCP: Esperanza Richters, PA-C  HPI/Recap of past 24 hours: Lance Powell is a 89 y.o. male with medical history significant of HTN, HLD, COPD on 2-3 L of oxygen at baseline, DM type 2, arthritis, and OSA who presented with complaints of weakness, N/V/D with encephalopathy. He had just recently been hospitalized from 8/26-8/28 at [facility] due to worsening cough after initially being diagnosed with COVID-19 on 8/12.  Patient did not meet criteria for Paxlovid or remdesivir.  He was noted to have COPD exacerbation treated with steroids, AKI due to the diarrhea treated with IV fluids. At the facility they had noted his O2 saturations to be as low as 81% on his normal 2 L of oxygen for which they sent him to the hospital for further evaluation. In the ED, patient was noted to be afebrile, pulse 100-104, respirations 19-27, and O2 saturations currently maintained on 2 L of nasal cannula oxygen.  Labs showed WBC 22.1, glucose 153, and BNP 158.9.  Chest x-ray noted hyperinflation with chronic changes and increased left retrocardiac opacity concerning for possible infiltrate.  Patient admitted for further management.     Today, pt c/o bilateral shoulder pain. Still requiring about 13L of O2, appears stable. Guarded prognosis. Palliative consult pending     Assessment/Plan: Principal Problem:   Sepsis due to pneumonia [facility]) Active Problems:   Acute metabolic encephalopathy   Nausea vomiting and diarrhea   COPD (chronic obstructive pulmonary disease) (HCC)   Fracture of unspecified part of left clavicle, subsequent encounter for fracture with routine healing   History of COVID-19   Diabetes mellitus type II, controlled (HCC)   Hiatal hernia   Obstructive sleep apnea   Hyperlipidemia   Physical deconditioning    Sepsis likely 2/2 HCAP Vs aspiration pneumonia On admission, noted to  be tachypneic with leukocytosis Currently requiring about 13 L of O2  CXR concerning for a left retrocardiac opacity CTA chest no evidence of large central PE, noted left posterior basilar opacity concerning for pneumonia or atelectasis, most likely due to aspirated material or mucous plugging involving the left mainstem bronchus and the left lower lobe bronchi UA unremarkable for infection BC X 2 pending Procalcitonin currently negative Urine strep negative, legionella pending Continue vancomycin, cefepime and Flagyl, de-escalate when medically appropriate Continue Supplemental O2  Acute on chronic respiratory failure with hypoxia Likely from aspirated material or mucous plugging Possible COPD exacerbation R/O acute CHF Currently requiring about 13 L of O2 BNP mildly elevated at 158, prior in 04/2022 was 23 CTA chest above Echo showed EF of 60-65%, no RWMA ABG pending D-dimer >20 On lasix, will hold due to poor oral intake and rising Cr Continue nebulizers, DuoNeb, mucomyst, chest PT Continue IV Solu-Medrol for now Strict I's and O's, daily weight Continue O2  Hypokalemia/hypomagnesemia Replace prn  Nausea, vomiting Resolved Patient has prior history of partial small bowel obstruction back in 2016 CT abdomen/pelvis unremarkable for any acute changes Antiemetics as needed Held misoprostol in case this is leading to the patient's diarrhea  Acute metabolic encephalopathy Multifactorial due to above Currently on alert, awake, intermittently disoriented Patient reportedly admitted altered and was speaking to family members who had passed.  At baseline daughter reports patient is alert and oriented x 3.  Question of secondary to acute infection. Neurochecks Management as above  Left clavicle fracture secondary to fall Prior to arrival.  Left clavicle fracture sustained  after fall on 9/6. Continue sling   History of COVID-19 infection Patient had previously been diagnosed  with COVID-19 on 8/22 at his facility.   Diabetes mellitus type 2, without long-term use of insulin SSI, Accu-Cheks, hypoglycemic protocol Hold metformin and linagliptin   Hiatal hernia Chronic.  Patient has a midline hiatal hernia that is reducible.  Bowel sounds present.   Hyperlipidemia Continue atorvastatin   Deconditioning PT to evaluate and treat   OSA on CPAP Continue CPAP at night    Estimated body mass index is 27.54 kg/m as calculated from the following:   Height as of 05/14/22: 6' (1.829 m).   Weight as of this encounter: 92.1 kg.     Code Status: DNR  Family Communication: Discussed extensively with daughter on 9/11  Disposition Plan: Status is: Inpatient Remains inpatient appropriate because: Level of care      Consultants: None  Procedures: None  Antimicrobials: Vancomycin Cefepime Flagyl  DVT prophylaxis:  Lovenox   Objective: Vitals:   10/03/22 0830 10/03/22 0920 10/03/22 1211 10/03/22 1500  BP: 116/70 137/81 114/66 114/67  Pulse: (!) 110 (!) 121 (!) 107 (!) 106  Resp: 14 (!) 31 18 (!) 23  Temp: 98 F (36.7 C) 97.7 F (36.5 C) 97.8 F (36.6 C) 97.7 F (36.5 C)  TempSrc: Oral Oral Oral Oral  SpO2:  90% 93% 91%  Weight:        Intake/Output Summary (Last 24 hours) at 10/03/2022 1644 Last data filed at 10/03/2022 1622 Gross per 24 hour  Intake 329.55 ml  Output 2650 ml  Net -2320.45 ml   Filed Weights   10/01/22 1400 10/02/22 0503 10/03/22 0349  Weight: 106.6 kg 98.1 kg 92.1 kg    Exam: General: NAD, chronically ill appearing Cardiovascular: S1, S2 present Respiratory: Diminished breath sounds bilaterally, congested Abdomen: Soft, nontender, nondistended, bowel sounds present, hernia noted Musculoskeletal: No bilateral pedal edema noted Skin: Normal Psychiatry: Intermittently confused, fair mood     Data Reviewed: CBC: Recent Labs  Lab 10/01/22 1121 10/02/22 0840 10/03/22 1109  WBC 22.1* 14.9* 17.9*  NEUTROABS  18.7*  --  16.9*  HGB 14.8 13.4 13.1  HCT 47.1 42.2 40.2  MCV 95.5 96.1 92.4  PLT 229 208 202   Basic Metabolic Panel: Recent Labs  Lab 10/01/22 1121 10/01/22 1736 10/02/22 0840 10/03/22 1109  NA 138  --  137 136  K 4.0  --  3.5 3.4*  CL 108  --  106 101  CO2 20*  --  20* 23  GLUCOSE 153*  --  116* 257*  BUN 19  --  18 21  CREATININE 1.23  --  1.24 1.33*  CALCIUM 9.7  --  9.2 9.2  MG  --  1.5*  --  1.6*   GFR: Estimated Creatinine Clearance: 39.7 mL/min (A) (by C-G formula based on SCr of 1.33 mg/dL (H)). Liver Function Tests: Recent Labs  Lab 10/01/22 1121  AST 16  ALT 17  ALKPHOS 72  BILITOT 1.0  PROT 7.0  ALBUMIN 3.1*   No results for input(s): "LIPASE", "AMYLASE" in the last 168 hours. No results for input(s): "AMMONIA" in the last 168 hours. Coagulation Profile: No results for input(s): "INR", "PROTIME" in the last 168 hours. Cardiac Enzymes: No results for input(s): "CKTOTAL", "CKMB", "CKMBINDEX", "TROPONINI" in the last 168 hours. BNP (last 3 results) Recent Labs    12/05/21 1410  PROBNP 26.0   HbA1C: Recent Labs    10/02/22 0840  HGBA1C 6.3*  CBG: Recent Labs  Lab 10/02/22 1648 10/02/22 2229 10/03/22 0723 10/03/22 1209 10/03/22 1608  GLUCAP 160* 147* 150* 258* 174*   Lipid Profile: No results for input(s): "CHOL", "HDL", "LDLCALC", "TRIG", "CHOLHDL", "LDLDIRECT" in the last 72 hours. Thyroid Function Tests: No results for input(s): "TSH", "T4TOTAL", "FREET4", "T3FREE", "THYROIDAB" in the last 72 hours. Anemia Panel: No results for input(s): "VITAMINB12", "FOLATE", "FERRITIN", "TIBC", "IRON", "RETICCTPCT" in the last 72 hours. Urine analysis:    Component Value Date/Time   COLORURINE YELLOW 10/02/2022 1427   APPEARANCEUR CLEAR 10/02/2022 1427   LABSPEC 1.016 10/02/2022 1427   PHURINE 5.0 10/02/2022 1427   GLUCOSEU NEGATIVE 10/02/2022 1427   HGBUR NEGATIVE 10/02/2022 1427   BILIRUBINUR NEGATIVE 10/02/2022 1427   BILIRUBINUR  negative 06/22/2019 1703   KETONESUR NEGATIVE 10/02/2022 1427   PROTEINUR NEGATIVE 10/02/2022 1427   UROBILINOGEN 0.2 06/22/2019 1703   UROBILINOGEN 0.2 02/25/2014 1633   NITRITE NEGATIVE 10/02/2022 1427   LEUKOCYTESUR NEGATIVE 10/02/2022 1427   Sepsis Labs: @LABRCNTIP (procalcitonin:4,lacticidven:4)  ) Recent Results (from the past 240 hour(s))  MRSA Next Gen by PCR, Nasal     Status: None   Collection Time: 10/02/22 12:11 PM  Result Value Ref Range Status   MRSA by PCR Next Gen NOT DETECTED NOT DETECTED Final    Comment: (NOTE) The GeneXpert MRSA Assay (FDA approved for NASAL specimens only), is one component of a comprehensive MRSA colonization surveillance program. It is not intended to diagnose MRSA infection nor to guide or monitor treatment for MRSA infections. Test performance is not FDA approved in patients less than 21 years old. Performed at [facility], [address], [place], [place] [address]   Culture, blood (Routine X 2) w Reflex to ID Panel     Status: None (Preliminary result)   Collection Time: 10/02/22  4:57 PM   Specimen: BLOOD LEFT ARM  Result Value Ref Range Status   Specimen Description BLOOD LEFT ARM  Final   Special Requests   Final    BOTTLES DRAWN AEROBIC AND ANAEROBIC Blood Culture adequate volume   Culture   Final    NO GROWTH < 24 HOURS Performed at [facility], [address] [place], [place] [address]    Report Status PENDING  Incomplete  Culture, blood (Routine X 2) w Reflex to ID Panel     Status: None (Preliminary result)   Collection Time: 10/02/22  5:00 PM   Specimen: BLOOD RIGHT HAND  Result Value Ref Range Status   Specimen Description BLOOD RIGHT HAND  Final   Special Requests   Final    BOTTLES DRAWN AEROBIC AND ANAEROBIC Blood Culture results may not be optimal due to an inadequate volume of blood received in culture bottles   Culture   Final    NO GROWTH < 24 HOURS Performed at [facility], [address], [place], [place] [address]    Report Status PENDING  Incomplete      Studies: ECHOCARDIOGRAM COMPLETE  Result Date: 10/03/2022    ECHOCARDIOGRAM REPORT   Patient Name:   TYDUS OPDYKE Date of Exam: 10/03/2022 Medical Rec #:  [id number]         Height:       72.0 in Accession #:    [id number]        Weight:       203.0 lb Date of Birth:  08/02/1929         BSA:  2.144 m Patient Age:    89 years          BP:           125/72 mmHg Patient Gender: M                 HR:           114 bpm. Exam Location:  Inpatient Procedure: 2D Echo, Color Doppler and Cardiac Doppler Indications:    R06.9 DOE  History:        Patient has no prior history of Echocardiogram examinations.                 COPD; Risk Factors:Hypertension, Diabetes, Dyslipidemia and                 Sleep Apnea.  Sonographer:    Irving Burton Senior RDCS Referring Phys: [id number] Monica Martinez [facility]  Sonographer Comments: No parasternal due to lung interference, active pneumonia at time of study IMPRESSIONS  1. Left ventricular ejection fraction, by estimation, is 60 to 65%. The left ventricle has normal function. The left ventricle has no regional wall motion abnormalities. Left ventricular diastolic parameters are indeterminate.  2. Right ventricular systolic function is normal. The right ventricular size is normal. Tricuspid regurgitation signal is inadequate for assessing PA pressure.  3. Left atrial size was moderately dilated.  4. Right atrial size was moderately dilated.  5. The mitral valve is degenerative. Trivial mitral valve regurgitation. Mild mitral stenosis. The mean mitral valve gradient is 5.5 mmHg with average heart rate of 118 bpm. Moderate mitral annular calcification.  6. The aortic valve was not well visualized. There is severe calcifcation of the aortic valve. Aortic valve regurgitation is not visualized. Moderate to severe aortic valve stenosis. Aortic valve area, by VTI measures 0.67 cm. Aortic valve mean gradient measures  20.0 mmHg. Aortic valve Vmax measures 2.98 m/s.  7. The inferior vena cava is normal in size with greater than 50% respiratory variability, suggesting right atrial pressure of 3 mmHg. FINDINGS  Left Ventricle: Left ventricular ejection fraction, by estimation, is 60 to 65%. The left ventricle has normal function. The left ventricle has no regional wall motion abnormalities. The left ventricular internal cavity size was normal in size. Suboptimal image quality limits for assessment of left ventricular hypertrophy. Left ventricular diastolic function could not be evaluated due to mitral stenosis. Left ventricular diastolic parameters are indeterminate. Right Ventricle: The right ventricular size is normal. No increase in right ventricular wall thickness. Right ventricular systolic function is normal. Tricuspid regurgitation signal is inadequate for assessing PA pressure. Left Atrium: Left atrial size was moderately dilated. Right Atrium: Right atrial size was moderately dilated. Pericardium: There is no evidence of pericardial effusion. Mitral Valve: The mitral valve is degenerative in appearance. There is mild calcification of the mitral valve leaflet(s). Moderate mitral annular calcification. Trivial mitral valve regurgitation. Mild mitral valve stenosis. MV peak gradient, 9.9 mmHg. The mean mitral valve gradient is 5.5 mmHg with average heart rate of 118 bpm. Tricuspid Valve: The tricuspid valve is grossly normal. Tricuspid valve regurgitation is trivial. Aortic Valve: The aortic valve was not well visualized. There is severe calcifcation of the aortic valve. Aortic valve regurgitation is not visualized. Moderate to severe aortic stenosis is present. Aortic valve mean gradient measures 20.0 mmHg. Aortic valve peak gradient measures 35.5 mmHg. Aortic valve area, by VTI measures 0.67 cm. Pulmonic Valve: The pulmonic valve was not well visualized. Pulmonic valve regurgitation is not visualized. Aorta: The  aortic root  was not well visualized and the ascending aorta was not well visualized. Venous: The inferior vena cava is normal in size with greater than 50% respiratory variability, suggesting right atrial pressure of 3 mmHg. IAS/Shunts: No atrial level shunt detected by color flow Doppler.  LEFT VENTRICLE PLAX 2D LVOT diam:     2.00 cm   Diastology LV SV:         36        LV e' medial:    10.20 cm/s LV SV Index:   17        LV E/e' medial:  14.0 LVOT Area:     3.14 cm  LV e' lateral:   5.77 cm/s                          LV E/e' lateral: 24.8  RIGHT VENTRICLE RV S prime:     12.90 cm/s TAPSE (M-mode): 2.6 cm LEFT ATRIUM           Index        RIGHT ATRIUM           Index LA Vol (A2C): 70.0 ml 32.64 ml/m  RA Area:     23.30 cm LA Vol (A4C): 84.5 ml 39.41 ml/m  RA Volume:   76.50 ml  35.68 ml/m  AORTIC VALVE AV Area (Vmax):    0.81 cm AV Area (Vmean):   0.90 cm AV Area (VTI):     0.67 cm AV Vmax:           298.00 cm/s AV Vmean:          212.000 cm/s AV VTI:            0.542 m AV Peak Grad:      35.5 mmHg AV Mean Grad:      20.0 mmHg LVOT Vmax:         76.50 cm/s LVOT Vmean:        60.400 cm/s LVOT VTI:          0.116 m LVOT/AV VTI ratio: 0.21 MITRAL VALVE MV Area (PHT): 6.17 cm     SHUNTS MV Area VTI:   1.99 cm     Systemic VTI:  0.12 m MV Peak grad:  9.9 mmHg     Systemic Diam: 2.00 cm MV Mean grad:  5.5 mmHg MV Vmax:       1.58 m/s MV Vmean:      107.0 cm/s MV Decel Time: 123 msec MV E velocity: 143.00 cm/s MV A velocity: 44.30 cm/s MV E/A ratio:  3.23 Arvilla Meres MD Electronically signed by Arvilla Meres MD Signature Date/Time: 10/03/2022/1:03:42 PM    Final    CT Angio Chest Pulmonary Embolism (PE) W or WO Contrast  Result Date: 10/03/2022 CLINICAL DATA:  Positive D-dimer. EXAM: CT ANGIOGRAPHY CHEST WITH CONTRAST TECHNIQUE: Multidetector CT imaging of the chest was performed using the standard protocol during bolus administration of intravenous contrast. Multiplanar CT image reconstructions and MIPs were  obtained to evaluate the vascular anatomy. RADIATION DOSE REDUCTION: This exam was performed according to the departmental dose-optimization program which includes automated exposure control, adjustment of the mA and/or kV according to patient size and/or use of iterative reconstruction technique. CONTRAST:  75mL OMNIPAQUE IOHEXOL 350 MG/ML SOLN COMPARISON:  October 02, 2022. FINDINGS: Cardiovascular: There is no evidence of large central pulmonary embolus. However, evaluation of several of the lower lobe branches of the left pulmonary artery  limited due to adjacent atelectasis or inflammation as well as some degree of motion artifact, and therefore smaller and more peripheral pulmonary emboli in this area cannot be excluded on the basis of this exam. Normal cardiac size. No pericardial effusion. Coronary artery calcifications are noted. Mediastinum/Nodes: No enlarged mediastinal, hilar, or axillary lymph nodes. Thyroid gland, trachea, and esophagus demonstrate no significant findings. Lungs/Pleura: No pneumothorax or pleural effusion is noted. Emphysematous disease is noted. Minimal right posterior basilar subsegmental atelectasis is noted. As noted above, left posterior basilar opacity is noted concerning for pneumonia or possibly atelectasis. There appears to be a significant amount of aspirated material or mucous plugging seen in the left mainstem bronchus and left lower lobe bronchi. Upper Abdomen: Right nephrolithiasis is noted. Stable 2.7 x 2.7 cm rounded calcified nodule seen in right posterior wall in the intercostal space most consistent with benign process. Musculoskeletal: No chest wall abnormality. No acute or significant osseous findings. Review of the MIP images confirms the above findings. IMPRESSION: There is no evidence of large central pulmonary embolus. However, evaluation of several of the lower lobe branches of the left pulmonary artery is significantly limited due to a combination of motion  artifact as well as adjacent inflammation or atelectasis, and therefore smaller and more peripheral pulmonary emboli in this area cannot be excluded on the basis of this exam. As noted above, left posterior basilar opacity is noted concerning for pneumonia or atelectasis, most likely due to aspirated material or mucous plugging involving the left mainstem bronchus and the left lower lobe bronchi. Coronary artery calcifications are noted. Right nephrolithiasis. Aortic Atherosclerosis (ICD10-I70.0) and Emphysema (ICD10-J43.9). Electronically Signed   By: Lupita Raider M.D.   On: 10/03/2022 10:23    Scheduled Meds:  acetylcysteine  3 mL Nebulization BID   arformoterol  15 mcg Nebulization BID   atorvastatin  40 mg Oral QHS   budesonide (PULMICORT) nebulizer solution  0.25 mg Nebulization BID   enoxaparin (LOVENOX) injection  40 mg Subcutaneous Q24H   furosemide  40 mg Intravenous Daily   guaiFENesin  600 mg Oral BID   insulin aspart  0-6 Units Subcutaneous TID WC   ipratropium-albuterol  3 mL Nebulization Q6H   melatonin  5 mg Oral QPM   methylPREDNISolone (SOLU-MEDROL) injection  40 mg Intravenous Q12H   polyvinyl alcohol  1 drop Both Eyes TID   sodium chloride flush  3 mL Intravenous Q12H   venlafaxine XR  75 mg Oral Daily    Continuous Infusions:  ceFEPime (MAXIPIME) IV 2 g (10/03/22 1100)   metronidazole 500 mg (10/03/22 0428)   vancomycin 200 mL/hr at 10/02/22 1835     LOS: 2 days     Briant Cedar, MD [facility]  If 7PM-7AM, please contact night-coverage [email or web address] 10/03/2022, 4:44 PM

## 2022-10-04 DIAGNOSIS — R5381 Other malaise: Secondary | ICD-10-CM | POA: Diagnosis not present

## 2022-10-04 DIAGNOSIS — R0600 Dyspnea, unspecified: Secondary | ICD-10-CM | POA: Diagnosis not present

## 2022-10-04 DIAGNOSIS — J438 Other emphysema: Secondary | ICD-10-CM | POA: Diagnosis not present

## 2022-10-04 DIAGNOSIS — Z66 Do not resuscitate: Secondary | ICD-10-CM

## 2022-10-04 DIAGNOSIS — J189 Pneumonia, unspecified organism: Secondary | ICD-10-CM | POA: Diagnosis not present

## 2022-10-04 DIAGNOSIS — Z515 Encounter for palliative care: Secondary | ICD-10-CM

## 2022-10-04 DIAGNOSIS — A419 Sepsis, unspecified organism: Secondary | ICD-10-CM | POA: Diagnosis not present

## 2022-10-04 LAB — CBC WITH DIFFERENTIAL/PLATELET
Abs Immature Granulocytes: 0.2 10*3/uL — ABNORMAL HIGH (ref 0.00–0.07)
Basophils Absolute: 0 10*3/uL (ref 0.0–0.1)
Basophils Relative: 0 %
Eosinophils Absolute: 0 10*3/uL (ref 0.0–0.5)
Eosinophils Relative: 0 %
HCT: 41.2 % (ref 39.0–52.0)
Hemoglobin: 13.2 g/dL (ref 13.0–17.0)
Immature Granulocytes: 1 %
Lymphocytes Relative: 5 %
Lymphs Abs: 1.1 10*3/uL (ref 0.7–4.0)
MCH: 30.6 pg (ref 26.0–34.0)
MCHC: 32 g/dL (ref 30.0–36.0)
MCV: 95.6 fL (ref 80.0–100.0)
Monocytes Absolute: 0.6 10*3/uL (ref 0.1–1.0)
Monocytes Relative: 3 %
Neutro Abs: 22.3 10*3/uL — ABNORMAL HIGH (ref 1.7–7.7)
Neutrophils Relative %: 91 %
Platelets: 214 10*3/uL (ref 150–400)
RBC: 4.31 MIL/uL (ref 4.22–5.81)
RDW: 15 % (ref 11.5–15.5)
WBC: 24.2 10*3/uL — ABNORMAL HIGH (ref 4.0–10.5)
nRBC: 0 % (ref 0.0–0.2)

## 2022-10-04 LAB — BASIC METABOLIC PANEL
Anion gap: 12 (ref 5–15)
BUN: 36 mg/dL — ABNORMAL HIGH (ref 8–23)
CO2: 22 mmol/L (ref 22–32)
Calcium: 9.6 mg/dL (ref 8.9–10.3)
Chloride: 102 mmol/L (ref 98–111)
Creatinine, Ser: 1.64 mg/dL — ABNORMAL HIGH (ref 0.61–1.24)
GFR, Estimated: 39 mL/min — ABNORMAL LOW (ref >60)
Glucose, Bld: 197 mg/dL — ABNORMAL HIGH (ref 70–99)
Potassium: 4.4 mmol/L (ref 3.5–5.1)
Sodium: 136 mmol/L (ref 135–145)

## 2022-10-04 LAB — LEGIONELLA PNEUMOPHILA SEROGP 1 UR AG: L. pneumophila Serogp 1 Ur Ag: NEGATIVE

## 2022-10-04 LAB — GLUCOSE, CAPILLARY
Glucose-Capillary: 189 mg/dL — ABNORMAL HIGH (ref 70–99)
Glucose-Capillary: 241 mg/dL — ABNORMAL HIGH (ref 70–99)

## 2022-10-04 LAB — MAGNESIUM: Magnesium: 2.3 mg/dL (ref 1.7–2.4)

## 2022-10-04 MED ORDER — ACETYLCYSTEINE 20 % IN SOLN
3.0000 mL | Freq: Two times a day (BID) | RESPIRATORY_TRACT | Status: DC
  Administered 2022-10-04: 3 mL via RESPIRATORY_TRACT
  Filled 2022-10-04 (×2): qty 4

## 2022-10-04 MED ORDER — MORPHINE SULFATE (PF) 2 MG/ML IV SOLN
1.0000 mg | INTRAVENOUS | Status: DC | PRN
  Administered 2022-10-04: 1 mg via INTRAVENOUS
  Filled 2022-10-04: qty 1

## 2022-10-04 NOTE — TOC Initial Note (Addendum)
Transition of Care [facility]) - Initial/Assessment Note    Patient Details  Name: Lance Powell MRN: [id number] Date of Birth: November 29, 1929  Transition [facility] Care [facility]) CM/SW Contact:    Lorri Frederick, LCSW Phone Number: 10/04/2022, 11:35 AM  Clinical Narrative:    CSW informed by Mary/Palliative that family has elected comfort care, interested in [facility] residential.  CSW spoke with pt daughter Santina Evans, who confirms this, requesting hospice to reach out to her sister [name].           CSW made referral to [facility].    No residential beds at [facility] available todaym   Expected Discharge Plan: Hospice Medical Facility Barriers to Discharge: Other (must enter comment) (pending hospice eligibility and bed availability)   Patient Goals and CMS Choice     Choice offered to / list presented to : Adult Children (daughter Santina Evans)      Expected Discharge Plan and Services     Post Acute Care Choice: Hospice                                        Prior Living Arrangements/Services                       Activities of Daily Living Home Assistive Devices/Equipment: None ADL Screening (condition at time of admission) Patient's cognitive ability adequate to safely complete daily activities?: No Is the patient deaf or have difficulty hearing?: No Does the patient have difficulty seeing, even when wearing glasses/contacts?: No Does the patient have difficulty concentrating, remembering, or making decisions?: Yes Patient able to express need for assistance with ADLs?: Yes Does the patient have difficulty dressing or bathing?: Yes Independently performs ADLs?: No Communication: Independent Dressing (OT): Needs assistance Is this a change from baseline?: Pre-admission baseline Grooming: Needs assistance Is this a change from baseline?: Pre-admission baseline Feeding: Needs assistance Is this a change from baseline?:  Pre-admission baseline Bathing: Needs assistance Is this a change from baseline?: Pre-admission baseline Toileting: Needs assistance Is this a change from baseline?: Pre-admission baseline In/Out Bed: Needs assistance Is this a change from baseline?: Pre-admission baseline Walks in Home: Needs assistance Is this a change from baseline?: Pre-admission baseline Does the patient have difficulty walking or climbing stairs?: Yes Weakness of Legs: Both Weakness of Arms/Hands: Both (L clavicle borken per pt)  Permission Sought/Granted                  Emotional Assessment              Admission diagnosis:  SIRS (systemic inflammatory response syndrome) (HCC) [R65.10] HCAP (healthcare-associated pneumonia) [J18.9] Sepsis due to pneumonia (HCC) [J18.9, A41.9] Patient Active Problem List   Diagnosis Date Noted   Sepsis due to pneumonia (HCC) 10/01/2022   Acute metabolic encephalopathy 10/01/2022   Nausea vomiting and diarrhea 10/01/2022   History of COVID-19 10/01/2022   Fracture of unspecified part of left clavicle, subsequent encounter for fracture with routine healing 10/01/2022   Hiatal hernia 10/01/2022   Chronic diarrhea 07/11/2021   Dementia (HCC) 03/14/2021   Accident due to mechanical fall without injury 06/11/2019   Sprain of medial collateral ligament of right knee 04/08/2019   Prepatellar bursitis of right knee 04/08/2019   Chronic respiratory failure (HCC) 11/06/2017   OA (osteoarthritis) of hip 07/27/2014   Abrasion 07/27/2014   Insomnia 06/27/2014  Chronic cholecystitis 06/16/2014   Coccyx pain 03/30/2014   Diabetes mellitus type II, controlled (HCC) 03/08/2014   Depression 03/08/2014   GERD (gastroesophageal reflux disease) 03/08/2014   Hyperlipidemia 03/08/2014   Abdominal pain    Diverticulitis of large intestine without perforation or abscess without bleeding    Blood poisoning    Calculus of gallbladder with acute cholecystitis 02/27/2014   Acute  cholecystitis    Diverticulitis 02/25/2014   Urinary retention 02/25/2014   Hypertension 02/25/2014   SBO (small bowel obstruction) (HCC) 02/25/2014   Sepsis (HCC) 02/25/2014   Cholecystitis 02/25/2014   Slow transit constipation 02/25/2014   Seborrheic dermatitis of scalp 11/24/2013   Diarrhea 07/12/2013   Contusion of multiple sites 05/24/2013   ED (erectile dysfunction) of organic origin 05/06/2013   Seborrheic keratosis 05/06/2013   Cough 02/17/2013   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   Acute maxillary sinusitis 10/30/2012   Allergic rhinitis 06/27/2012   Hypertrophic and atrophic condition of skin 06/27/2012   Increased frequency of urination 06/27/2012   Lower urinary tract infectious disease 06/27/2012   Major depressive disorder, single episode, unspecified 06/27/2012   Physical deconditioning 06/27/2012   Psychosexual dysfunction with inhibited sexual excitement 06/27/2012   Personal history of colonic polyps 01/06/2012   Special screening for malignant neoplasms, colon 11/15/2011   Pruritus ani 11/15/2011   COPD (chronic obstructive pulmonary disease) (HCC) 01/11/2011   DOE (dyspnea on exertion) 01/01/2011   Fissure in ano 12/21/2010   HYPERLIPIDEMIA 01/11/2009   Obstructive sleep apnea 01/11/2009   ARTHRITIS, RHEUMATOID 01/11/2009   OSTEOARTHRITIS 01/11/2009   PCP:  Esperanza Richters, PA-C Pharmacy:  No Pharmacies Listed    Social Determinants of Health (SDOH) Social History: SDOH Screenings   Food Insecurity: No Food Insecurity (10/01/2022)  Housing: Low Risk  (10/01/2022)  Transportation Needs: No Transportation Needs (10/01/2022)  Utilities: Not At Risk (10/01/2022)  Alcohol Screen: Low Risk  (06/26/2020)  Depression (PHQ2-9): Low Risk  (03/02/2021)  Financial Resource Strain: Medium Risk (12/31/2020)  Physical Activity: Inactive (03/02/2021)  Social Connections: Socially Isolated (03/02/2021)  Stress: No Stress Concern Present (03/02/2021)  Tobacco Use: Medium Risk  (10/02/2022)   SDOH Interventions:     Readmission Risk Interventions     No data to display

## 2022-10-04 NOTE — Progress Notes (Signed)
[facility] called and stated ETA 1.5-2 hr. Report called to [facility] and given to [place].

## 2022-10-04 NOTE — Consult Note (Signed)
Consultation Note Date: 10/04/2022   Patient Name: Lance Powell  DOB: [birth year]  MRN: [id number]  Age / Sex: 89 y.o., male  PCP: Marisue Brooklyn Referring Physician: Briant Cedar, MD  Reason for Consultation: Establishing goals of care  HPI/Patient Profile: 89 y.o. male   admitted on 10/01/2022 with  89 y.o. male with medical history significant of HTN, HLD, COPD on 2-3 L of oxygen at baseline, DM type 2, arthritis, and OSA who presented with complaints of weakness, N/V/D with encephalopathy.   He had just recently been hospitalized from 8/26-8/28 at [facility] due to worsening cough after initially being diagnosed with COVID-19 on 8/12.    Patient did not meet criteria for Paxlovid or remdesivir.  He was noted to have COPD exacerbation treated with steroids, AKI due to the diarrhea treated with IV fluids.   At the facility they had noted his O2 saturations to be as low as 81% on his normal 2 L of oxygen for which they sent him to the hospital for further evaluation.    In the ED, patient was noted to be afebrile, pulse 100-104, respirations 19-27, and O2 saturations currently maintained on 2 L of nasal cannula oxygen.    Labs showed WBC 22.1, glucose 153, and BNP 158.9.  Chest x-ray noted hyperinflation with chronic changes and increased left retrocardiac opacity concerning for possible infiltrate.  Patient admitted for further management.     Family face treatment option decisions, advanced directive decisions and anticipatory care needs.   Clinical Assessment and Goals of Care:  This NP Lorinda Creed reviewed medical records, received report from team, assessed the patient and then meet at the patient's bedside along with his 2 daughters Justin Mend and Quinn Axe and to discuss diagnosis, prognosis, GOC, EOL wishes disposition and  options.   Concept of Palliative Care was introduced as specialized medical care for people and their families living with serious illness.  If focuses on providing relief from the symptoms and stress of a serious illness.  The goal is to improve quality of life for both the patient and the family.    Values and goals of care important to patient and family were attempted to be elicited.  Created space and opportunity for patient   to explore thoughts and feelings regarding Powell medical situation   Family verbalized an understanding of the patient's Powell medical situation and limited prognosis.  They comfortably expressed their desire for comfort and dignity at end-of-life.  They wish to avoid all life prolonging measures.   A  discussion was had today regarding advanced directives.  Concepts specific to code status, artifical feeding and hydration, continued IV antibiotics and rehospitalization was had.  The difference between a aggressive medical intervention path  and a palliative comfort care path for this patient at this time was had.     MOST form completed   Natural trajectory and expectations at EOL were discussed.  Questions and concerns addressed.  Patient  encouraged to  call with questions or concerns.    Education offered on hospice benefit; philosophy and eligibility   Family is hopeful for residential hospice at [facility].  Sadly their sister died there approximately 8 weeks ago.   PMT will continue to support holistically.           Maureen O'Connel Putnam and Maisie Fus Jospeh O'Connell  HCPOA    SUMMARY OF RECOMMENDATIONS    Code Status/Advance Care Planning: DNR   Symptom Management:  Dyspnea/pain: Morphine 1 mg IV every 1 hours as needed  Palliative Prophylaxis:  Aspiration, Bowel Regimen, Frequent Pain Assessment, and Oral Care  Additional Recommendations (Limitations, Scope, Preferences): Full Comfort Care  Psycho-social/Spiritual:   Desire for further Chaplaincy support:no Additional Recommendations: Education on Hospice  Prognosis:  < 2 weeks  Discharge Planning: Hospice facility      Primary Diagnoses: Present on Admission:  Sepsis due to pneumonia (HCC)  COPD (chronic obstructive pulmonary disease) (HCC)  Acute metabolic encephalopathy  Nausea vomiting and diarrhea  History of COVID-19  Physical deconditioning  Hyperlipidemia  Obstructive sleep apnea   I have reviewed the medical record, interviewed the patient and family, and examined the patient. The following aspects are pertinent.  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   COPD (chronic obstructive pulmonary disease) (HCC)    Depression 03/08/2014   Diabetes mellitus    GERD (gastroesophageal reflux disease) 03/08/2014   History of blood transfusion    Hyperlipemia    Hypertension    OSA (obstructive sleep apnea)    Osteoarthritis    Rheumatoid arthritis(714.0)    Sleep apnea    cpap   Social History   Socioeconomic History   Marital status: Widowed    Spouse name: Not on file   Number of children: 6   Years of education: Not on file   Highest education level: Not on file  Occupational History   Occupation: Retired    Comment: Art gallery manager   Occupation: retird  Tobacco Use   Smoking status: Former    Powell packs/day: 0.00    Average packs/day: 4.0 packs/day for 30.0 years (120.0 ttl pk-yrs)    Types: Cigarettes    Start date: 01/22/1948    Quit date: 01/21/1978    Years since quitting: 44.7   Smokeless tobacco: Never  Vaping Use   Vaping status: Never Used  Substance and Sexual Activity   Alcohol use: No   Drug use: No   Sexual activity: Not on file  Other Topics Concern   Not on file  Social History Narrative   Not on file   Social Determinants of Health   Financial Resource Strain: Medium Risk (12/31/2020)   Overall Financial Resource Strain (CARDIA)    Difficulty of Paying  Living Expenses: Somewhat hard  Food Insecurity: No Food Insecurity (10/01/2022)   Hunger Vital Sign    Worried About Running Out of Food in the Last Year: Never true    Ran Out of Food in the Last Year: Never true  Transportation Needs: No Transportation Needs (10/01/2022)   PRAPARE - Administrator, Civil Service (Medical): No    Lack of Transportation (Non-Medical): No  Physical Activity: Inactive (03/02/2021)   Exercise Vital Sign    Days of Exercise per Week: 0 days    Minutes of Exercise per Session: 0 min  Stress: No Stress Concern Present (03/02/2021)   [employer] of Occupational Health - Occupational  Stress Questionnaire    Feeling of Stress : Not at all  Social Connections: Socially Isolated (03/02/2021)   Social Connection and Isolation Panel [NHANES]    Frequency of Communication with Friends and Family: More than three times a week    Frequency of Social Gatherings with Friends and Family: More than three times a week    Attends Religious Services: Never    Database administrator or Organizations: No    Attends Banker Meetings: Never    Marital Status: Widowed   Family History  Problem Relation Age of Onset   Heart disease Mother    Diabetes Mother    Emphysema Brother    Breast cancer Daughter    Cancer Daughter        breast   Stomach cancer Neg Hx    Esophageal cancer Neg Hx    Colon cancer Neg Hx    Liver cancer Neg Hx    Pancreatic cancer Neg Hx    Rectal cancer Neg Hx    Scheduled Meds:  acetylcysteine  3 mL Nebulization BID   arformoterol  15 mcg Nebulization BID   atorvastatin  40 mg Oral QHS   budesonide (PULMICORT) nebulizer solution  0.25 mg Nebulization BID   enoxaparin (LOVENOX) injection  40 mg Subcutaneous Q24H   guaiFENesin  600 mg Oral BID   insulin aspart  0-6 Units Subcutaneous TID WC   ipratropium-albuterol  3 mL Nebulization Q6H   melatonin  5 mg Oral QPM   methylPREDNISolone (SOLU-MEDROL) injection  40 mg  Intravenous Q12H   polyvinyl alcohol  1 drop Both Eyes TID   sodium chloride flush  3 mL Intravenous Q12H   venlafaxine XR  75 mg Oral Daily   Continuous Infusions:  ceFEPime (MAXIPIME) IV 2 g (10/04/22 0903)   metronidazole 500 mg (10/04/22 0943)   vancomycin 1,000 mg (10/03/22 2201)   PRN Meds:.acetaminophen **OR** acetaminophen, albuterol, oxyCODONE Medications Prior to Admission:  Prior to Admission medications   Medication Sig Start Date End Date Taking? Authorizing Provider  albuterol (VENTOLIN HFA) 108 (90 Base) MCG/ACT inhaler Inhale 2 puffs into the lungs every 6 (six) hours as needed for wheezing or shortness of breath. Patient taking differently: Inhale 2 puffs into the lungs every 6 (six) hours as needed for wheezing or shortness of breath (COPD). 11/29/21  Yes Cobb, Ruby Cola, NP  aspirin 81 MG tablet Take 1 tablet (81 mg total) by mouth every morning. Stop for 1 week and then resume Patient taking differently: Take 81 mg by mouth daily. 01/08/12  Yes Vassie Loll, MD  atorvastatin (LIPITOR) 40 MG tablet TAKE 1 TABLET BY MOUTH EVERY DAY Patient taking differently: Take 40 mg by mouth at bedtime. 07/17/21  Yes [name], Ramon Dredge, PA-C  carboxymethylcellulose (ARTIFICIAL TEARS) 1 % ophthalmic solution Place 1 drop into both eyes 3 (three) times daily.   Yes [provider]  celecoxib (CELEBREX) 200 MG capsule 1 tab po daily prn pain(advise use sparingly based on age and kidney function) Patient taking differently: Take 200 mg by mouth daily as needed for mild pain or moderate pain. 01/30/22  Yes [name], Ramon Dredge, PA-C  cholecalciferol (VITAMIN D3) 25 MCG (1000 UNIT) tablet Take 1,000 Units by mouth daily.   Yes [provider]  dextromethorphan-guaiFENesin (MUCINEX DM) 30-600 MG 12hr tablet Take 1 tablet by mouth 2 (two) times daily.   Yes [provider]  linagliptin (TRADJENTA) 5 MG TABS tablet Take 5 mg by mouth daily.  Yes [provider]   loperamide (IMODIUM A-D) 2 MG tablet Take 2 mg by mouth every 6 (six) hours as needed for diarrhea or loose stools.   Yes [provider]  Melatonin 5 MG CAPS Take 5 mg by mouth every evening.   Yes [provider]  metFORMIN (GLUCOPHAGE) 500 MG tablet TAKE 1 TABLET(500 MG) BY MOUTH THREE TIMES DAILY Patient taking differently: Take 750 mg by mouth in the morning and at bedtime. 07/04/22  Yes [name], Ramon Dredge, PA-C  misoprostol (CYTOTEC) 100 MCG tablet TAKE 1 TABLET BY MOUTH TWICE DAILY AND 2 TABLETS AT BEDTIME Patient taking differently: Take 100-200 mcg by mouth See admin instructions. Take 100 mcg twice daily. Take 200 mcg at bedtime. 05/10/22  Yes [name], Ramon Dredge, PA-C  Multiple Vitamins-Minerals (CENTRUM) tablet Take 1 tablet by mouth daily.   Yes [provider]  oxyCODONE (ROXICODONE) 5 MG immediate release tablet Take 1 tablet (5 mg total) by mouth every 6 (six) hours as needed for severe pain. 09/27/22  Yes Eber Hong, MD  OXYGEN Inhale 2 L/min into the lungs continuous.   Yes [provider]  polyethylene glycol (MIRALAX) 17 g packet Take 17 g by mouth daily as needed for mild constipation or moderate constipation.   Yes [provider]  PRESCRIPTION MEDICATION Inhale 1 Device into the lungs See admin instructions. CPAP- At bedtime   Yes [provider]  Psyllium (METAMUCIL) 48.57 % POWD Take 5 mLs by mouth daily as needed (constipation).   Yes [provider]  sennosides-docusate sodium (SENOKOT-S) 8.6-50 MG tablet Take 1 tablet by mouth daily.   Yes [provider]  Tiotropium Bromide-Olodaterol (STIOLTO RESPIMAT) 2.5-2.5 MCG/ACT AERS Inhale 2 puffs into the lungs daily. Patient taking differently: Inhale 1 puff into the lungs daily. 04/04/21  Yes Cobb, Ruby Cola, NP  venlafaxine XR (EFFEXOR-XR) 75 MG 24 hr capsule Take 75 mg by mouth daily with breakfast.   Yes [provider]  vitamin C (ASCORBIC ACID) 500  MG tablet Take 500 mg by mouth daily.   Yes [provider]  CONTOUR NEXT TEST test strip USE AS DIRECTED TO CHECK BLOOD SUGAR TWICE DAILY 04/13/21   [name], Ramon Dredge, PA-C   No Known Allergies Review of Systems  Physical Exam  Vital Signs: BP 137/79 (BP Location: Left Arm)   Pulse 87   Temp (!) 96.5 F (35.8 C) (Axillary) Comment: Primary RN aware of temperature in axillary, oral temp could not be read on thermometer.  Resp (!) 27   Wt 98.3 kg   SpO2 95%   BMI 29.39 kg/m  Pain Scale: 0-10 POSS *See Group Information*: 1-Acceptable,Awake and alert Pain Score: 0-No pain   SpO2: SpO2: 95 % O2 Device:SpO2: 95 % O2 Flow Rate: .O2 Flow Rate (L/min): (S) 10 L/min  IO: Intake/output summary:  Intake/Output Summary (Last 24 hours) at 10/04/2022 1014 Last data filed at 10/04/2022 0454 Gross per 24 hour  Intake 496 ml  Output 1400 ml  Net -904 ml    LBM: Last BM Date :  (pt stated he could not remember last bowel movement) Baseline Weight: Weight: 106.6 kg Most recent weight: Weight: 98.3 kg     Palliative Assessment/Data:     Time:   75 minutes   Signed by: Lorinda Creed, NP   Please contact Palliative Medicine Team phone at [phone] for questions and concerns.  For individual provider: See Loretha Stapler

## 2022-10-04 NOTE — Progress Notes (Signed)
Physical Therapy Treatment Patient Details Name: Lance Powell MRN: [id number] DOB: 10/01/1929 Today's Date: 10/04/2022   History of Present Illness Pt is a 89 y.o. male presenting 10/01/2022 from [facility] after staff found him to be hypoxic (84% SpO2) on his normal 4L O2. Admitted for PNA. significant of hypertension, hyperlipidemia, COPD on 2-3 L of oxygen at baseline, DM type II,  arthritis, and sleep apnea    PT Comments  Pt pleasant, in good spirits and telling jokes throughout session. Pt on 10LO2, VSS during PT. Pt tolerated EOB sitting x10 minutes, and able to tolerate weight shifting, LE exercises as dynamic seated balance intervention. Pt also progressed to standing this date, does complain of imbalance and fear of falling but no LOB noted. Pt overall requiring mod physical assist for mobility at this time, is transfer level at baseline. Plan of care discussions ongoing, plan for palliative meeting possibly today. PT to continue to follow.       If plan is discharge home, recommend the following: Assistance with cooking/housework;A lot of help with bathing/dressing/bathroom;Assist for transportation;Help with stairs or ramp for entrance;A lot of help with walking and/or transfers   Can travel by private vehicle        Equipment Recommendations  None recommended by PT    Recommendations for Other Services       Precautions / Restrictions Precautions Precautions: Fall Precaution Comments: Pt RN ordered L shoulder sling for L clavicular Fx, not yet arrived. PT reminding pt of NWB LLE Restrictions Weight Bearing Restrictions: No     Mobility  Bed Mobility Overal bed mobility: Needs Assistance Bed Mobility: Supine to Sit, Sit to Supine     Supine to sit: Mod assist Sit to supine: Mod assist, +2 for physical assistance   General bed mobility comments: assist for trunk and LE management, avoiding use of bilat UEs given L fx and R shoulder pain. increased  time and sequencing cues needed    Transfers Overall transfer level: Needs assistance Equipment used: 1 person hand held assist Transfers: Sit to/from Stand, Bed to chair/wheelchair/BSC Sit to Stand: Min assist, From elevated surface, +2 safety/equipment   Step pivot transfers: Min assist, From elevated surface, +2 safety/equipment       General transfer comment: assist for rise, steady, and x4 lateral steps towards HOB. Pt with preference for posterior bias    Ambulation/Gait               General Gait Details: nt - pt is transfer level at baseline   Optometrist     Tilt Bed    Modified Rankin (Stroke Patients Only)       Balance Overall balance assessment: Needs assistance   Sitting balance-Leahy Scale: Fair     Standing balance support: During functional activity, Single extremity supported Standing balance-Leahy Scale: Poor Standing balance comment: reliant on PT support for balance                            Cognition Arousal: Alert Behavior During Therapy: Restless Overall Cognitive Status: Impaired/Different from baseline Area of Impairment: Attention, Memory, Following commands, Safety/judgement, Problem solving                   Current Attention Level: Selective Memory: Decreased short-term memory, Decreased recall of precautions Following Commands: Follows one step commands with increased time  Safety/Judgement: Decreased awareness of safety, Decreased awareness of deficits   Problem Solving: Slow processing, Difficulty sequencing, Decreased initiation, Requires verbal cues, Requires tactile cues General Comments: pt needing reminders about L clavicle fx throughout session, pt attempting to use LUE for pushing/pulling (no sling present, RN ordered).        Exercises General Exercises - Lower Extremity Long Arc Quad: AROM, Both, 15 reps, Seated Hip Flexion/Marching: AROM, Both, 15 reps,  Seated    General Comments General comments (skin integrity, edema, etc.): on 10LO2, SPO2 90% and greater throughout      Pertinent Vitals/Pain Pain Assessment Pain Assessment: Faces Faces Pain Scale: Hurts even more Pain Location: shoulders R>L Pain Descriptors / Indicators: Aching, Sore, Guarding, Grimacing Pain Intervention(s): Limited activity within patient's tolerance, Monitored during session, Patient requesting pain meds-RN notified    Home Living                          Prior Function            PT Goals (current goals can now be found in the care plan section) Acute Rehab PT Goals Patient Stated Goal: daughter hopes he can stay 3 days to get to rehab PT Goal Formulation: With patient/family Time For Goal Achievement: 10/16/22 Potential to Achieve Goals: Fair Progress towards PT goals: Progressing toward goals    Frequency    Min 1X/week      PT Plan      Co-evaluation              AM-PAC PT "6 Clicks" Mobility   Outcome Measure  Help needed turning from your back to your side while in a flat bed without using bedrails?: A Lot Help needed moving from lying on your back to sitting on the side of a flat bed without using bedrails?: A Lot Help needed moving to and from a bed to a chair (including a wheelchair)?: A Lot Help needed standing up from a chair using your arms (e.g., wheelchair or bedside chair)?: A Lot Help needed to walk in hospital room?: Total Help needed climbing 3-5 steps with a railing? : Total 6 Click Score: 10    End of Session Equipment Utilized During Treatment: Oxygen;Gait belt Activity Tolerance: Patient limited by fatigue;Patient limited by pain Patient left: in bed;with call bell/phone within reach;with bed alarm set;with nursing/sitter in room Nurse Communication: Mobility status PT Visit Diagnosis: Muscle weakness (generalized) (M62.81);Other abnormalities of gait and mobility (R26.89);Pain Pain - Right/Left:   (bilat) Pain - part of body: Shoulder     Time: 1308-6578 PT Time Calculation (min) (ACUTE ONLY): 23 min  Charges:    $Therapeutic Activity: 8-22 mins $Neuromuscular Re-education: 8-22 mins PT General Charges $$ ACUTE PT VISIT: 1 Visit                     Marye Round, PT DPT Acute Rehabilitation Services Secure Chat Preferred  Office [phone]    Leathie Weich E Christain [place] 10/04/2022, 9:18 AM

## 2022-10-04 NOTE — Progress Notes (Addendum)
PROGRESS NOTE  Lance Powell WUJ:[id number] DOB: 20-Nov-1929 DOA: 10/01/2022 PCP: Esperanza Richters, PA-C  HPI/Recap of past 24 hours: Lance Powell is a 89 y.o. male with medical history significant of HTN, HLD, COPD on 2-3 L of oxygen at baseline, DM type 2, arthritis, and OSA who presented with complaints of weakness, N/V/D with encephalopathy. He had just recently been hospitalized from 8/26-8/28 at [facility] due to worsening cough after initially being diagnosed with COVID-19 on 8/12.  Patient did not meet criteria for Paxlovid or remdesivir.  He was noted to have COPD exacerbation treated with steroids, AKI due to the diarrhea treated with IV fluids. At the facility they had noted his O2 saturations to be as low as 81% on his normal 2 L of oxygen for which they sent him to the hospital for further evaluation. In the ED, patient was noted to be afebrile, pulse 100-104, respirations 19-27, and O2 saturations currently maintained on 2 L of nasal cannula oxygen.  Labs showed WBC 22.1, glucose 153, and BNP 158.9.  Chest x-ray noted hyperinflation with chronic changes and increased left retrocardiac opacity concerning for possible infiltrate.  Patient admitted for further management.     Today, patient still requiring about 10 L of O2, noted bilateral shoulder pain.  Palliative/hospice discussed with family, agreed to switch patient to comfort care.    Assessment/Plan: Principal Problem:   Sepsis due to pneumonia [facility]) Active Problems:   Acute metabolic encephalopathy   Nausea vomiting and diarrhea   COPD (chronic obstructive pulmonary disease) (HCC)   Fracture of unspecified part of left clavicle, subsequent encounter for fracture with routine healing   History of COVID-19   Diabetes mellitus type II, controlled (HCC)   Hiatal hernia   Obstructive sleep apnea   Hyperlipidemia   Physical deconditioning    Sepsis likely 2/2 HCAP Vs aspiration  pneumonia On admission, noted to be tachypneic with leukocytosis Currently requiring about 10 L of O2  CXR concerning for a left retrocardiac opacity CTA chest no evidence of large central PE, noted left posterior basilar opacity concerning for pneumonia or atelectasis, most likely due to aspirated material or mucous plugging involving the left mainstem bronchus and the left lower lobe bronchi UA unremarkable for infection BC X 2 NGTD Procalcitonin currently negative Urine strep negative Switch to comfort care, discontinue oral antibiotics Continue Supplemental O2  Acute on chronic respiratory failure with hypoxia Likely from aspirated material or mucous plugging Possible COPD exacerbation R/O acute CHF Currently requiring about 10 L of O2 BNP mildly elevated at 158, prior in 04/2022 was 23 CTA chest above Echo showed EF of 60-65%, no RWMA D-dimer >20 Continue O2  Hypokalemia/hypomagnesemia  Nausea, vomiting Resolved Patient has prior history of partial small bowel obstruction back in 2016 CT abdomen/pelvis unremarkable for any acute changes  Acute metabolic encephalopathy Improved Multifactorial due to above Currently on alert, awake, intermittently disoriented Patient reportedly admitted altered and was speaking to family members who had passed.  At baseline daughter reports patient is alert and oriented x 3  Left clavicle fracture secondary to fall Prior to arrival.  Left clavicle fracture sustained after fall on 9/6. Continue sling, pain management   History of COVID-19 infection Patient had previously been diagnosed with COVID-19 on 8/22 at his facility   Diabetes mellitus type 2, without long-term use of insulin   Hiatal hernia   Hyperlipidemia   Deconditioning   OSA on CPAP    Estimated  body mass index is 29.39 kg/m as calculated from the following:   Height as of 05/14/22: 6' (1.829 m).   Weight as of this encounter: 98.3 kg.     Code Status:  DNR  Family Communication: Discussed extensively with daughter on 9/11  Disposition Plan: Status is: Inpatient Remains inpatient appropriate because: Plan for residential hospice      Consultants: None  Procedures: None  Antimicrobials: None  DVT prophylaxis: None   Objective: Vitals:   10/04/22 0725 10/04/22 0740 10/04/22 0747 10/04/22 1154  BP: 132/77 137/79  121/69  Pulse: 83 80 87 79  Resp: (!) 22 (!) 23 (!) 27 20  Temp: 97.8 F (36.6 C) (!) 96.5 F (35.8 C)  (!) 97.3 F (36.3 C)  TempSrc: Axillary Axillary  Oral  SpO2: 97% 99% 95% 95%  Weight:        Intake/Output Summary (Last 24 hours) at 10/04/2022 1449 Last data filed at 10/04/2022 1610 Gross per 24 hour  Intake 496 ml  Output 600 ml  Net -104 ml   Filed Weights   10/02/22 0503 10/03/22 0349 10/04/22 0430  Weight: 98.1 kg 92.1 kg 98.3 kg    Exam: General: NAD, chronically ill appearing Cardiovascular: S1, S2 present Respiratory: Diminished breath sounds bilaterally, congested Abdomen: Soft, nontender, nondistended, bowel sounds present, hernia noted Musculoskeletal: No bilateral pedal edema noted Skin: Normal Psychiatry: Intermittently confused, fair mood     Data Reviewed: CBC: Recent Labs  Lab 10/01/22 1121 10/02/22 0840 10/03/22 1109 10/04/22 0819  WBC 22.1* 14.9* 17.9* 24.2*  NEUTROABS 18.7*  --  16.9* 22.3*  HGB 14.8 13.4 13.1 13.2  HCT 47.1 42.2 40.2 41.2  MCV 95.5 96.1 92.4 95.6  PLT 229 208 202 214   Basic Metabolic Panel: Recent Labs  Lab 10/01/22 1121 10/01/22 1736 10/02/22 0840 10/03/22 1109 10/04/22 0819  NA 138  --  137 136 136  K 4.0  --  3.5 3.4* 4.4  CL 108  --  106 101 102  CO2 20*  --  20* 23 22  GLUCOSE 153*  --  116* 257* 197*  BUN 19  --  18 21 36*  CREATININE 1.23  --  1.24 1.33* 1.64*  CALCIUM 9.7  --  9.2 9.2 9.6  MG  --  1.5*  --  1.6* 2.3   GFR: Estimated Creatinine Clearance: 35.6 mL/min (A) (by C-G formula based on SCr of 1.64 mg/dL  (H)). Liver Function Tests: Recent Labs  Lab 10/01/22 1121  AST 16  ALT 17  ALKPHOS 72  BILITOT 1.0  PROT 7.0  ALBUMIN 3.1*   No results for input(s): "LIPASE", "AMYLASE" in the last 168 hours. No results for input(s): "AMMONIA" in the last 168 hours. Coagulation Profile: No results for input(s): "INR", "PROTIME" in the last 168 hours. Cardiac Enzymes: No results for input(s): "CKTOTAL", "CKMB", "CKMBINDEX", "TROPONINI" in the last 168 hours. BNP (last 3 results) Recent Labs    12/05/21 1410  PROBNP 26.0   HbA1C: Recent Labs    10/02/22 0840  HGBA1C 6.3*   CBG: Recent Labs  Lab 10/03/22 1209 10/03/22 1608 10/03/22 2141 10/04/22 0725 10/04/22 1209  GLUCAP 258* 174* 229* 189* 241*   Lipid Profile: No results for input(s): "CHOL", "HDL", "LDLCALC", "TRIG", "CHOLHDL", "LDLDIRECT" in the last 72 hours. Thyroid Function Tests: No results for input(s): "TSH", "T4TOTAL", "FREET4", "T3FREE", "THYROIDAB" in the last 72 hours. Anemia Panel: No results for input(s): "VITAMINB12", "FOLATE", "FERRITIN", "TIBC", "IRON", "RETICCTPCT" in the last  72 hours. Urine analysis:    Component Value Date/Time   COLORURINE YELLOW 10/02/2022 1427   APPEARANCEUR CLEAR 10/02/2022 1427   LABSPEC 1.016 10/02/2022 1427   PHURINE 5.0 10/02/2022 1427   GLUCOSEU NEGATIVE 10/02/2022 1427   HGBUR NEGATIVE 10/02/2022 1427   BILIRUBINUR NEGATIVE 10/02/2022 1427   BILIRUBINUR negative 06/22/2019 1703   KETONESUR NEGATIVE 10/02/2022 1427   PROTEINUR NEGATIVE 10/02/2022 1427   UROBILINOGEN 0.2 06/22/2019 1703   UROBILINOGEN 0.2 02/25/2014 1633   NITRITE NEGATIVE 10/02/2022 1427   LEUKOCYTESUR NEGATIVE 10/02/2022 1427   Sepsis Labs: @LABRCNTIP (procalcitonin:4,lacticidven:4)  ) Recent Results (from the past 240 hour(s))  MRSA Next Gen by PCR, Nasal     Status: None   Collection Time: 10/02/22 12:11 PM  Result Value Ref Range Status   MRSA by PCR Next Gen NOT DETECTED NOT DETECTED Final     Comment: (NOTE) The GeneXpert MRSA Assay (FDA approved for NASAL specimens only), is one component of a comprehensive MRSA colonization surveillance program. It is not intended to diagnose MRSA infection nor to guide or monitor treatment for MRSA infections. Test performance is not FDA approved in patients less than 15 years old. Performed at [facility], [address] [place], [place] [address]   Culture, blood (Routine X 2) w Reflex to ID Panel     Status: None (Preliminary result)   Collection Time: 10/02/22  4:57 PM   Specimen: BLOOD LEFT ARM  Result Value Ref Range Status   Specimen Description BLOOD LEFT ARM  Final   Special Requests   Final    BOTTLES DRAWN AEROBIC AND ANAEROBIC Blood Culture adequate volume   Culture   Final    NO GROWTH 2 DAYS Performed at [facility], [address], [place], [place] [address]    Report Status PENDING  Incomplete  Culture, blood (Routine X 2) w Reflex to ID Panel     Status: None (Preliminary result)   Collection Time: 10/02/22  5:00 PM   Specimen: BLOOD RIGHT HAND  Result Value Ref Range Status   Specimen Description BLOOD RIGHT HAND  Final   Special Requests   Final    BOTTLES DRAWN AEROBIC AND ANAEROBIC Blood Culture results may not be optimal due to an inadequate volume of blood received in culture bottles   Culture   Final    NO GROWTH 2 DAYS Performed at [facility], [address], [place], [place] [address]    Report Status PENDING  Incomplete      Studies: No results found.  Scheduled Meds:  acetylcysteine  3 mL Nebulization BID   polyvinyl alcohol  1 drop Both Eyes TID   sodium chloride flush  3 mL Intravenous Q12H   venlafaxine XR  75 mg Oral Daily    Continuous Infusions:     LOS: 3 days     Briant Cedar, MD [facility]  If 7PM-7AM, please contact night-coverage [email or web address] 10/04/2022, 2:49 PM

## 2022-10-04 NOTE — Care Management Important Message (Signed)
Important Message  Patient Details  Name: Lance Powell MRN: [id number] Date of Birth: [birth year]   Medicare Important Message Given:  Yes     Sherilyn Banker 10/04/2022, 1:30 PM

## 2022-10-04 NOTE — TOC Progression Note (Signed)
Transition of Care [facility]) - Progression Note    Patient Details  Name: Lance Powell MRN: [id number] Date of Birth: 22-Aug-1929  Transition of Care [facility]) CM/SW Contact  Lorri Frederick, LCSW Phone Number: 10/04/2022, 4:15 PM  Clinical Narrative:   CSW informed of potential admission to [facility] tonight.  Pt has been placed on will call status with [facility] in case this does occur.  Please call [facility] at [phone] if transport is needed.  DC envelope placed in pt chart.    Expected Discharge Plan: Hospice Medical Facility Barriers to Discharge: Other (must enter comment) (pending hospice eligibility and bed availability)  Expected Discharge Plan and Services     Post Acute Care Choice: Hospice                                         Social Determinants of Health (SDOH) Interventions SDOH Screenings   Food Insecurity: No Food Insecurity (10/01/2022)  Housing: Low Risk  (10/01/2022)  Transportation Needs: No Transportation Needs (10/01/2022)  Utilities: Not At Risk (10/01/2022)  Alcohol Screen: Low Risk  (06/26/2020)  Depression (PHQ2-9): Low Risk  (03/02/2021)  Financial Resource Strain: Medium Risk (12/31/2020)  Physical Activity: Inactive (03/02/2021)  Social Connections: Socially Isolated (03/02/2021)  Stress: No Stress Concern Present (03/02/2021)  Tobacco Use: Medium Risk (10/02/2022)    Readmission Risk Interventions     No data to display

## 2022-10-04 NOTE — Progress Notes (Signed)
   10/04/22 0023  BiPAP/CPAP/SIPAP  $ Non-Invasive Home Ventilator  Subsequent  BiPAP/CPAP/SIPAP Pt Type Adult  BiPAP/CPAP/SIPAP Resmed  Mask Type Full face mask  Patient Home Equipment No  CPAP/SIPAP surface wiped down Yes  BiPAP/CPAP /SiPAP Vitals  Temp 97.6 F (36.4 C)  Pulse Rate 93  Resp (!) 21  BP 115/72  SpO2 95 %  MEWS Score/Color  MEWS Score 1  MEWS Score Color Green

## 2022-10-04 NOTE — Discharge Summary (Signed)
Physician Discharge Summary   Patient: Lance Powell MRN: [id number] DOB: 1929/10/06  Admit date:     10/01/2022  Discharge date: 10/04/22  Discharge Physician: Briant Cedar   PCP: Esperanza Richters, PA-C   Recommendations at discharge:    Residential hospice  Discharge Diagnoses: Principal Problem:   Sepsis due to pneumonia [facility]) Active Problems:   Acute metabolic encephalopathy   Nausea vomiting and diarrhea   COPD (chronic obstructive pulmonary disease) (HCC)   Fracture of unspecified part of left clavicle, subsequent encounter for fracture with routine healing   History of COVID-19   Diabetes mellitus type II, controlled (HCC)   Hiatal hernia   Obstructive sleep apnea   Hyperlipidemia   Physical deconditioning    Hospital Course: Lance Powell is a 89 y.o. male with medical history significant of HTN, HLD, COPD on 2-3 L of oxygen at baseline, DM type 2, arthritis, and OSA who presented with complaints of weakness, N/V/D with encephalopathy. He had just recently been hospitalized from 8/26-8/28 at [facility] due to worsening cough after initially being diagnosed with COVID-19 on 8/12.  Patient did not meet criteria for Paxlovid or remdesivir.  He was noted to have COPD exacerbation treated with steroids, AKI due to the diarrhea treated with IV fluids. At the facility they had noted his O2 saturations to be as low as 81% on his normal 2 L of oxygen for which they sent him to the hospital for further evaluation. In the ED, patient was noted to be afebrile, pulse 100-104, respirations 19-27, and O2 saturations currently maintained on 2 L of nasal cannula oxygen.  Labs showed WBC 22.1, glucose 153, and BNP 158.9.  Chest x-ray noted hyperinflation with chronic changes and increased left retrocardiac opacity concerning for possible infiltrate.  Patient admitted for further management.    Today, patient still requiring about 10 L of O2, noted  bilateral shoulder pain.  Palliative/hospice discussed with family, agreed to switch patient to comfort care on 10/04/22   Assessment and Plan:  Sepsis likely 2/2 HCAP Vs aspiration pneumonia On admission, noted to be tachypneic with leukocytosis Currently requiring about 10 L of O2  CXR concerning for a left retrocardiac opacity CTA chest no evidence of large central PE, noted left posterior basilar opacity concerning for pneumonia or atelectasis, most likely due to aspirated material or mucous plugging involving the left mainstem bronchus and the left lower lobe bronchi UA unremarkable for infection BC X 2 NGTD Procalcitonin currently negative Urine strep negative Switched to comfort care, discontinue oral antibiotics Continue Supplemental O2   Acute on chronic respiratory failure with hypoxia Likely from aspirated material or mucous plugging Possible COPD exacerbation R/O acute CHF Currently requiring about 10 L of O2 BNP mildly elevated at 158, prior in 04/2022 was 23 CTA chest above Echo showed EF of 60-65%, no RWMA D-dimer >20 Continue O2   Hypokalemia/hypomagnesemia   Nausea, vomiting Resolved Patient has prior history of partial small bowel obstruction back in 2016 CT abdomen/pelvis unremarkable for any acute changes   Acute metabolic encephalopathy Improved Multifactorial due to above Currently on alert, awake, intermittently disoriented Patient reportedly admitted altered and was speaking to family members who had passed.  At baseline daughter reports patient is alert and oriented x 3   Left clavicle fracture secondary to fall Prior to arrival.  Left clavicle fracture sustained after fall on 9/6. Continue sling, pain management   History of COVID-19 infection Patient had previously been  diagnosed with COVID-19 on 8/22 at his facility   Diabetes mellitus type 2, without long-term use of insulin   Hiatal hernia   Hyperlipidemia   Deconditioning   OSA on  CPAP      Consultants: Palliative/hospice Procedures performed: None Disposition: Hospice care Diet recommendation: As tolerated    DISCHARGE MEDICATION: Allergies as of 10/04/2022   No Known Allergies      Medication List     STOP taking these medications    albuterol 108 (90 Base) MCG/ACT inhaler Commonly known as: VENTOLIN HFA   Artificial Tears 1 % ophthalmic solution Generic drug: carboxymethylcellulose   ascorbic acid 500 MG tablet Commonly known as: VITAMIN C   aspirin 81 MG tablet   atorvastatin 40 MG tablet Commonly known as: LIPITOR   celecoxib 200 MG capsule Commonly known as: CeleBREX   Centrum tablet   cholecalciferol 25 MCG (1000 UNIT) tablet Commonly known as: VITAMIN D3   Contour Next Test test strip Generic drug: glucose blood   dextromethorphan-guaiFENesin 30-600 MG 12hr tablet Commonly known as: MUCINEX DM   linagliptin 5 MG Tabs tablet Commonly known as: TRADJENTA   loperamide 2 MG tablet Commonly known as: IMODIUM A-D   Melatonin 5 MG Caps   Metamucil 48.57 % Powd Generic drug: Psyllium   metFORMIN 500 MG tablet Commonly known as: GLUCOPHAGE   MiraLax 17 g packet Generic drug: polyethylene glycol   misoprostol 100 MCG tablet Commonly known as: CYTOTEC   oxyCODONE 5 MG immediate release tablet Commonly known as: Roxicodone   PRESCRIPTION MEDICATION   sennosides-docusate sodium 8.6-50 MG tablet Commonly known as: SENOKOT-S   Stiolto Respimat 2.5-2.5 MCG/ACT Aers Generic drug: Tiotropium Bromide-Olodaterol   venlafaxine XR 75 MG 24 hr capsule Commonly known as: EFFEXOR-XR       TAKE these medications    OXYGEN Inhale 2 L/min into the lungs continuous.        Discharge Exam: Filed Weights   10/02/22 0503 10/03/22 0349 10/04/22 0430  Weight: 98.1 kg 92.1 kg 98.3 kg   General: NAD, chronically ill appearing Cardiovascular: S1, S2 present Respiratory: Diminished breath sounds bilaterally,  congested Abdomen: Soft, nontender, nondistended, bowel sounds present, hernia noted Musculoskeletal: No bilateral pedal edema noted Skin: Normal Psychiatry: Intermittently confused, fair mood   Condition at discharge: poor  The results of significant diagnostics from this hospitalization (including imaging, microbiology, ancillary and laboratory) are listed below for reference.   Imaging Studies: ECHOCARDIOGRAM COMPLETE  Result Date: 10/03/2022    ECHOCARDIOGRAM REPORT   Patient Name:   BHAVIN NIELSEN Date of Exam: 10/03/2022 Medical Rec #:  [id number]         Height:       72.0 in Accession #:    [id number]        Weight:       203.0 lb Date of Birth:  Mar 13, 1929         BSA:          2.144 m Patient Age:    89 years          BP:           125/72 mmHg Patient Gender: M                 HR:           114 bpm. Exam Location:  Inpatient Procedure: 2D Echo, Color Doppler and Cardiac Doppler Indications:    R06.9 DOE  History:  Patient has no prior history of Echocardiogram examinations.                 COPD; Risk Factors:Hypertension, Diabetes, Dyslipidemia and                 Sleep Apnea.  Sonographer:    Irving Burton Senior RDCS Referring Phys: [id number] Monica Martinez [facility]  Sonographer Comments: No parasternal due to lung interference, active pneumonia at time of study IMPRESSIONS  1. Left ventricular ejection fraction, by estimation, is 60 to 65%. The left ventricle has normal function. The left ventricle has no regional wall motion abnormalities. Left ventricular diastolic parameters are indeterminate.  2. Right ventricular systolic function is normal. The right ventricular size is normal. Tricuspid regurgitation signal is inadequate for assessing PA pressure.  3. Left atrial size was moderately dilated.  4. Right atrial size was moderately dilated.  5. The mitral valve is degenerative. Trivial mitral valve regurgitation. Mild mitral stenosis. The mean mitral valve gradient is 5.5 mmHg with average  heart rate of 118 bpm. Moderate mitral annular calcification.  6. The aortic valve was not well visualized. There is severe calcifcation of the aortic valve. Aortic valve regurgitation is not visualized. Moderate to severe aortic valve stenosis. Aortic valve area, by VTI measures 0.67 cm. Aortic valve mean gradient measures 20.0 mmHg. Aortic valve Vmax measures 2.98 m/s.  7. The inferior vena cava is normal in size with greater than 50% respiratory variability, suggesting right atrial pressure of 3 mmHg. FINDINGS  Left Ventricle: Left ventricular ejection fraction, by estimation, is 60 to 65%. The left ventricle has normal function. The left ventricle has no regional wall motion abnormalities. The left ventricular internal cavity size was normal in size. Suboptimal image quality limits for assessment of left ventricular hypertrophy. Left ventricular diastolic function could not be evaluated due to mitral stenosis. Left ventricular diastolic parameters are indeterminate. Right Ventricle: The right ventricular size is normal. No increase in right ventricular wall thickness. Right ventricular systolic function is normal. Tricuspid regurgitation signal is inadequate for assessing PA pressure. Left Atrium: Left atrial size was moderately dilated. Right At[facility]: Right atrial size was moderately dilated. Pericardium: There is no evidence of pericardial effusion. Mitral Valve: The mitral valve is degenerative in appearance. There is mild calcification of the mitral valve leaflet(s). Moderate mitral annular calcification. Trivial mitral valve regurgitation. Mild mitral valve stenosis. MV peak gradient, 9.9 mmHg. The mean mitral valve gradient is 5.5 mmHg with average heart rate of 118 bpm. Tricuspid Valve: The tricuspid valve is grossly normal. Tricuspid valve regurgitation is trivial. Aortic Valve: The aortic valve was not well visualized. There is severe calcifcation of the aortic valve. Aortic valve regurgitation is not  visualized. Moderate to severe aortic stenosis is present. Aortic valve mean gradient measures 20.0 mmHg. Aortic valve peak gradient measures 35.5 mmHg. Aortic valve area, by VTI measures 0.67 cm. Pulmonic Valve: The pulmonic valve was not well visualized. Pulmonic valve regurgitation is not visualized. Aorta: The aortic root was not well visualized and the ascending aorta was not well visualized. Venous: The inferior vena cava is normal in size with greater than 50% respiratory variability, suggesting right atrial pressure of 3 mmHg. IAS/Shunts: No atrial level shunt detected by color flow Doppler.  LEFT VENTRICLE PLAX 2D LVOT diam:     2.00 cm   Diastology LV SV:         36        LV e' medial:    10.20 cm/s LV  SV Index:   17        LV E/e' medial:  14.0 LVOT Area:     3.14 cm  LV e' lateral:   5.77 cm/s                          LV E/e' lateral: 24.8  RIGHT VENTRICLE RV S prime:     12.90 cm/s TAPSE (M-mode): 2.6 cm LEFT ATRIUM           Index        RIGHT ATRIUM           Index LA Vol (A2C): 70.0 ml 32.64 ml/m  RA Area:     23.30 cm LA Vol (A4C): 84.5 ml 39.41 ml/m  RA Volume:   76.50 ml  35.68 ml/m  AORTIC VALVE AV Area (Vmax):    0.81 cm AV Area (Vmean):   0.90 cm AV Area (VTI):     0.67 cm AV Vmax:           298.00 cm/s AV Vmean:          212.000 cm/s AV VTI:            0.542 m AV Peak Grad:      35.5 mmHg AV Mean Grad:      20.0 mmHg LVOT Vmax:         76.50 cm/s LVOT Vmean:        60.400 cm/s LVOT VTI:          0.116 m LVOT/AV VTI ratio: 0.21 MITRAL VALVE MV Area (PHT): 6.17 cm     SHUNTS MV Area VTI:   1.99 cm     Systemic VTI:  0.12 m MV Peak grad:  9.9 mmHg     Systemic Diam: 2.00 cm MV Mean grad:  5.5 mmHg MV Vmax:       1.58 m/s MV Vmean:      107.0 cm/s MV Decel Time: 123 msec MV E velocity: 143.00 cm/s MV A velocity: 44.30 cm/s MV E/A ratio:  3.23 Arvilla Meres MD Electronically signed by Arvilla Meres MD Signature Date/Time: 10/03/2022/1:03:42 PM    Final    CT Angio Chest Pulmonary  Embolism (PE) W or WO Contrast  Result Date: 10/03/2022 CLINICAL DATA:  Positive D-dimer. EXAM: CT ANGIOGRAPHY CHEST WITH CONTRAST TECHNIQUE: Multidetector CT imaging of the chest was performed using the standard protocol during bolus administration of intravenous contrast. Multiplanar CT image reconstructions and MIPs were obtained to evaluate the vascular anatomy. RADIATION DOSE REDUCTION: This exam was performed according to the departmental dose-optimization program which includes automated exposure control, adjustment of the mA and/or kV according to patient size and/or use of iterative reconstruction technique. CONTRAST:  75mL OMNIPAQUE IOHEXOL 350 MG/ML SOLN COMPARISON:  October 02, 2022. FINDINGS: Cardiovascular: There is no evidence of large central pulmonary embolus. However, evaluation of several of the lower lobe branches of the left pulmonary artery limited due to adjacent atelectasis or inflammation as well as some degree of motion artifact, and therefore smaller and more peripheral pulmonary emboli in this area cannot be excluded on the basis of this exam. Normal cardiac size. No pericardial effusion. Coronary artery calcifications are noted. Mediastinum/Nodes: No enlarged mediastinal, hilar, or axillary lymph nodes. Thyroid gland, trachea, and esophagus demonstrate no significant findings. Lungs/Pleura: No pneumothorax or pleural effusion is noted. Emphysematous disease is noted. Minimal right posterior basilar subsegmental atelectasis is noted. As noted above, left posterior basilar  opacity is noted concerning for pneumonia or possibly atelectasis. There appears to be a significant amount of aspirated material or mucous plugging seen in the left mainstem bronchus and left lower lobe bronchi. Upper Abdomen: Right nephrolithiasis is noted. Stable 2.7 x 2.7 cm rounded calcified nodule seen in right posterior wall in the intercostal space most consistent with benign process. Musculoskeletal: No  chest wall abnormality. No acute or significant osseous findings. Review of the MIP images confirms the above findings. IMPRESSION: There is no evidence of large central pulmonary embolus. However, evaluation of several of the lower lobe branches of the left pulmonary artery is significantly limited due to a combination of motion artifact as well as adjacent inflammation or atelectasis, and therefore smaller and more peripheral pulmonary emboli in this area cannot be excluded on the basis of this exam. As noted above, left posterior basilar opacity is noted concerning for pneumonia or atelectasis, most likely due to aspirated material or mucous plugging involving the left mainstem bronchus and the left lower lobe bronchi. Coronary artery calcifications are noted. Right nephrolithiasis. Aortic Atherosclerosis (ICD10-I70.0) and Emphysema (ICD10-J43.9). Electronically Signed   By: Lupita Raider M.D.   On: 10/03/2022 10:23   CT CHEST ABDOMEN PELVIS WO CONTRAST  Result Date: 10/02/2022 CLINICAL DATA:  Sepsis. EXAM: CT CHEST, ABDOMEN AND PELVIS WITHOUT CONTRAST TECHNIQUE: Multidetector CT imaging of the chest, abdomen and pelvis was performed following the standard protocol without IV contrast. RADIATION DOSE REDUCTION: This exam was performed according to the departmental dose-optimization program which includes automated exposure control, adjustment of the mA and/or kV according to patient size and/or use of iterative reconstruction technique. COMPARISON:  Chest CT dated 05/16/2022 and CT of the abdomen pelvis dated 05/15/2022. FINDINGS: Evaluation of this exam is limited in the absence of intravenous contrast as well as due to respiratory motion. CT CHEST FINDINGS Cardiovascular: There is no cardiomegaly or pericardial effusion. There is coronary vascular calcification. Moderate atherosclerotic calcification of the thoracic aorta. No aneurysmal dilatation. The central pulmonary arteries are grossly unremarkable.  Mediastinum/Nodes: No hilar or mediastinal adenopathy. Evaluation of the hilar lymph node is however limited due to consolidative changes of the adjacent lungs as well as in the absence of intravenous contrast. The esophagus is grossly unremarkable. No mediastinal fluid collection. Lungs/Pleura: Patchy areas of consolidation involving the lung bases of the lower lobes, new since the prior CT and concerning for infiltrate or aspiration. Clinical correlation and follow-up recommended. Background of moderate centrilobular emphysema. There is a 4 mm nodule in the lingula. No pneumothorax. Mucus secretions noted in the distal trachea and bilateral mainstem bronchi. There is occlusion of the bronchus intermedius and bilateral lower lobe bronchi by mucus secretion/planning. Musculoskeletal: No acute osseous pathology. CT ABDOMEN PELVIS FINDINGS No intra-abdominal free air or free fluid. Hepatobiliary: The liver is unremarkable. No biliary dilatation. Cholecystectomy. No retained calcified stone noted in the central CBD. Pancreas: Unremarkable. No pancreatic ductal dilatation or surrounding inflammatory changes. Spleen: Normal in size without focal abnormality. Adrenals/Urinary Tract: The adrenal glands unremarkable. Two adjacent nonobstructing calculi in the upper pole of the right kidney, each measures approximately 1 cm. There is no hydronephrosis on either side. The urinary bladder is distended. There is trabeculated bladder wall likely secondary to chronic bladder outlet obstruction. Stomach/Bowel: Surgical clips noted in the upper abdomen at the gastroesophageal junction. There is a 4.3 cm proximal duodenal diverticulum. There is sigmoid diverticulosis and scattered colonic diverticula without active inflammatory changes. There is no bowel obstruction or active inflammation.  Appendectomy. Vascular/Lymphatic: Moderate aortoiliac atherosclerotic disease. The IVC is unremarkable. No portal venous gas. There is no  adenopathy. Reproductive: The prostate and seminal vesicles are grossly unremarkable. No pelvic mass. Other: Midline vertical anterior abdominal wall incisional scar. There is focal area of anterior abdominal wall hernia repair. Small midline ventral hernia containing a short segment of small bowel without evidence of obstruction. Musculoskeletal: Degenerative changes of the spine and hips. No acute osseous pathology. Indeterminate partially calcified 2.3 x 3.1 cm lesion in the right posterior eleventh intercostal space, stable and likely benign process. This may represent a nerve sheath tumor. IMPRESSION: 1. Mucous impaction of the lower lobe bronchi with postobstructive pneumonia or aspiration in the lower lobes. 2. Nonobstructing right renal calculi. No hydronephrosis. 3. Colonic diverticulosis. No bowel obstruction. 4. Aortic Atherosclerosis (ICD10-I70.0) and Emphysema (ICD10-J43.9). Electronically Signed   By: Elgie Collard M.D.   On: 10/02/2022 17:30   DG Chest Port 1 View  Result Date: 10/01/2022 CLINICAL DATA:  Shortness of breath EXAM: PORTABLE CHEST 1 VIEW COMPARISON:  X-ray 09/16/2022 x-ray.  CT scan 05/16/2022 FINDINGS: Hyperinflation with chronic lung changes identified. There is slight increase in patchy opacity left lung base and retrocardiac. Mild linear opacity in the right lung base. No pneumothorax or effusion. Stable cardiopericardial silhouette with a calcified aorta. Overlapping cardiac leads. Surgical changes in the epigastric region. Sclerotic focus again identified along the left first rib. IMPRESSION: Hyperinflation with chronic changes. Increasing left retrocardiac opacity. Possible acute infiltrate. Recommend follow-up Electronically Signed   By: Karen Kays M.D.   On: 10/01/2022 13:49   DG Clavicle Left  Result Date: 09/27/2022 CLINICAL DATA:  Fall, left arm pain EXAM: LEFT CLAVICLE - 2+ VIEWS COMPARISON:  None Available. FINDINGS: There is an acute, oblique fracture of the  distal left clavicle in the region of the expected coracoclavicular ligamentous insertion with 7 mm volar displacement of the distal fracture fragment. Coracoclavicular and acromioclavicular alignment appears normal. No dislocation. Limited evaluation of the left apex is unremarkable. IMPRESSION: 1. Acute, oblique fracture of the distal left clavicle. Electronically Signed   By: Helyn Numbers M.D.   On: 09/27/2022 22:39   DG Shoulder Left  Result Date: 09/27/2022 CLINICAL DATA:  Status post fall. EXAM: LEFT SHOULDER - 2+ VIEW COMPARISON:  None Available. FINDINGS: There is an acute fracture deformity involving the distal left clavicle. There is no evidence of dislocation. Mild soft tissue swelling is seen adjacent to the previously noted fracture site. IMPRESSION: Acute fracture of the distal left clavicle. Electronically Signed   By: Aram Candela M.D.   On: 09/27/2022 22:37    Microbiology: Results for orders placed or performed during the hospital encounter of 10/01/22  MRSA Next Gen by PCR, Nasal     Status: None   Collection Time: 10/02/22 12:11 PM  Result Value Ref Range Status   MRSA by PCR Next Gen NOT DETECTED NOT DETECTED Final    Comment: (NOTE) The GeneXpert MRSA Assay (FDA approved for NASAL specimens only), is one component of a comprehensive MRSA colonization surveillance program. It is not intended to diagnose MRSA infection nor to guide or monitor treatment for MRSA infections. Test performance is not FDA approved in patients less than 22 years old. Performed at [facility], [address], [place], [place] [address]   Culture, blood (Routine X 2) w Reflex to ID Panel     Status: None (Preliminary result)   Collection Time: 10/02/22  4:57 PM   Specimen: BLOOD  LEFT ARM  Result Value Ref Range Status   Specimen Description BLOOD LEFT ARM  Final   Special Requests   Final    BOTTLES DRAWN AEROBIC AND ANAEROBIC Blood Culture adequate volume   Culture   Final    NO  GROWTH 2 DAYS Performed at [facility], [address] [place], [place] [address]    Report Status PENDING  Incomplete  Culture, blood (Routine X 2) w Reflex to ID Panel     Status: None (Preliminary result)   Collection Time: 10/02/22  5:00 PM   Specimen: BLOOD RIGHT HAND  Result Value Ref Range Status   Specimen Description BLOOD RIGHT HAND  Final   Special Requests   Final    BOTTLES DRAWN AEROBIC AND ANAEROBIC Blood Culture results may not be optimal due to an inadequate volume of blood received in culture bottles   Culture   Final    NO GROWTH 2 DAYS Performed at [facility], [address], [place], [place] [address]    Report Status PENDING  Incomplete    Labs: CBC: Recent Labs  Lab 10/01/22 1121 10/02/22 0840 10/03/22 1109 10/04/22 0819  WBC 22.1* 14.9* 17.9* 24.2*  NEUTROABS 18.7*  --  16.9* 22.3*  HGB 14.8 13.4 13.1 13.2  HCT 47.1 42.2 40.2 41.2  MCV 95.5 96.1 92.4 95.6  PLT 229 208 202 214   Basic Metabolic Panel: Recent Labs  Lab 10/01/22 1121 10/01/22 1736 10/02/22 0840 10/03/22 1109 10/04/22 0819  NA 138  --  137 136 136  K 4.0  --  3.5 3.4* 4.4  CL 108  --  106 101 102  CO2 20*  --  20* 23 22  GLUCOSE 153*  --  116* 257* 197*  BUN 19  --  18 21 36*  CREATININE 1.23  --  1.24 1.33* 1.64*  CALCIUM 9.7  --  9.2 9.2 9.6  MG  --  1.5*  --  1.6* 2.3   Liver Function Tests: Recent Labs  Lab 10/01/22 1121  AST 16  ALT 17  ALKPHOS 72  BILITOT 1.0  PROT 7.0  ALBUMIN 3.1*   CBG: Recent Labs  Lab 10/03/22 1209 10/03/22 1608 10/03/22 2141 10/04/22 0725 10/04/22 1209  GLUCAP 258* 174* 229* 189* 241*    Discharge time spent: greater than 30 minutes.  Signed: Briant Cedar, MD [facility] 10/04/2022

## 2022-10-07 LAB — CULTURE, BLOOD (ROUTINE X 2)
Culture: NO GROWTH
Culture: NO GROWTH
Special Requests: ADEQUATE

## 2022-10-11 ENCOUNTER — Telehealth: Payer: Self-pay

## 2022-10-11 NOTE — Telephone Encounter (Signed)
Caller Name: [facility] Phone #:   [phone]  Date of death: 11/09/2022 Place of death:  Hospice Time of death: 9:54 am

## 2022-10-22 DEATH — deceased

## 2023-08-22 NOTE — Procedures (Signed)
Mask fit
